# Patient Record
Sex: Male | Born: 1980 | Race: Black or African American | Hispanic: No | Marital: Married | Smoking: Never smoker
Health system: Southern US, Community
[De-identification: ages and names within clinical notes are randomized; demographics above are authoritative.]

## PROBLEM LIST (undated history)

## (undated) DIAGNOSIS — E669 Obesity, unspecified: Secondary | ICD-10-CM

## (undated) DIAGNOSIS — L02212 Cutaneous abscess of back [any part, except buttock]: Secondary | ICD-10-CM

## (undated) DIAGNOSIS — E119 Type 2 diabetes mellitus without complications: Secondary | ICD-10-CM

## (undated) DIAGNOSIS — I10 Essential (primary) hypertension: Secondary | ICD-10-CM

## (undated) DIAGNOSIS — K219 Gastro-esophageal reflux disease without esophagitis: Secondary | ICD-10-CM

## (undated) DIAGNOSIS — G4733 Obstructive sleep apnea (adult) (pediatric): Secondary | ICD-10-CM

## (undated) DIAGNOSIS — E662 Morbid (severe) obesity with alveolar hypoventilation: Secondary | ICD-10-CM

## (undated) DIAGNOSIS — I5020 Unspecified systolic (congestive) heart failure: Secondary | ICD-10-CM

## (undated) DIAGNOSIS — J9690 Respiratory failure, unspecified, unspecified whether with hypoxia or hypercapnia: Secondary | ICD-10-CM

## (undated) DIAGNOSIS — L723 Sebaceous cyst: Secondary | ICD-10-CM

## (undated) DIAGNOSIS — I429 Cardiomyopathy, unspecified: Secondary | ICD-10-CM

## (undated) DIAGNOSIS — J9611 Chronic respiratory failure with hypoxia: Secondary | ICD-10-CM

## (undated) HISTORY — DX: Sebaceous cyst: L72.3

## (undated) HISTORY — PX: NO PAST SURGERIES: SHX2092

## (undated) HISTORY — DX: Cutaneous abscess of back (any part, except buttock and flank): L02.212

---

## 2009-08-15 ENCOUNTER — Emergency Department (HOSPITAL_COMMUNITY): Admission: EM | Admit: 2009-08-15 | Discharge: 2009-08-15 | Payer: Self-pay | Admitting: Emergency Medicine

## 2010-06-05 ENCOUNTER — Emergency Department: Payer: Self-pay | Admitting: Emergency Medicine

## 2010-10-11 LAB — DIFFERENTIAL
Basophils Relative: 1 % (ref 0–1)
Monocytes Relative: 12 % (ref 3–12)
Neutro Abs: 5 10*3/uL (ref 1.7–7.7)
Neutrophils Relative %: 62 % (ref 43–77)

## 2010-10-11 LAB — COMPREHENSIVE METABOLIC PANEL
Albumin: 4 g/dL (ref 3.5–5.2)
Alkaline Phosphatase: 66 U/L (ref 39–117)
BUN: 5 mg/dL — ABNORMAL LOW (ref 6–23)
Calcium: 9.2 mg/dL (ref 8.4–10.5)
Creatinine, Ser: 0.89 mg/dL (ref 0.4–1.5)
Potassium: 5.7 mEq/L — ABNORMAL HIGH (ref 3.5–5.1)
Total Protein: 8.1 g/dL (ref 6.0–8.3)

## 2010-10-11 LAB — CBC
Hemoglobin: 14.4 g/dL (ref 13.0–17.0)
MCHC: 34 g/dL (ref 30.0–36.0)
RDW: 12.6 % (ref 11.5–15.5)
WBC: 8.1 10*3/uL (ref 4.0–10.5)

## 2010-10-11 LAB — URINE MICROSCOPIC-ADD ON

## 2010-10-11 LAB — URINALYSIS, ROUTINE W REFLEX MICROSCOPIC
Hgb urine dipstick: NEGATIVE
Specific Gravity, Urine: 1.026 (ref 1.005–1.030)
pH: 6 (ref 5.0–8.0)

## 2010-10-11 LAB — POCT I-STAT, CHEM 8
Calcium, Ion: 1.13 mmol/L (ref 1.12–1.32)
Hemoglobin: 15.6 g/dL (ref 13.0–17.0)
Sodium: 137 mEq/L (ref 135–145)
TCO2: 27 mmol/L (ref 0–100)

## 2013-08-24 ENCOUNTER — Emergency Department: Payer: Self-pay | Admitting: Emergency Medicine

## 2015-02-09 ENCOUNTER — Emergency Department
Admission: EM | Admit: 2015-02-09 | Discharge: 2015-02-09 | Disposition: A | Discharge status: Home / Self Care | Payer: No Typology Code available for payment source | Attending: Emergency Medicine | Admitting: Emergency Medicine

## 2015-02-09 DIAGNOSIS — R631 Polydipsia: Secondary | ICD-10-CM | POA: Diagnosis present

## 2015-02-09 DIAGNOSIS — R197 Diarrhea, unspecified: Secondary | ICD-10-CM | POA: Insufficient documentation

## 2015-02-09 DIAGNOSIS — I10 Essential (primary) hypertension: Secondary | ICD-10-CM | POA: Diagnosis not present

## 2015-02-09 DIAGNOSIS — Z720 Tobacco use: Secondary | ICD-10-CM | POA: Insufficient documentation

## 2015-02-09 HISTORY — DX: Essential (primary) hypertension: I10

## 2015-02-09 LAB — GLUCOSE, CAPILLARY: GLUCOSE-CAPILLARY: 145 mg/dL — AB (ref 65–99)

## 2015-02-09 NOTE — Discharge Instructions (Signed)

## 2015-02-09 NOTE — ED Notes (Signed)
Last 2 days has had disrrhea, a lot of thirst, feels some betterm no diarrhea today

## 2015-02-09 NOTE — ED Notes (Signed)
Pt c/o increased thirst and urination for the past 2 days, FS 145 in triage, denies hx of DM, denies any burning with urination or other sx.

## 2015-02-09 NOTE — ED Provider Notes (Signed)
[facility] Emergency Department Provider Note  ____________________________________________  Time seen: On arrival  I have reviewed the triage vital signs and the nursing notes.   HISTORY  Chief Complaint Polydipsia and Urinary Frequency    HPI Clifford Nichols is a 34 y.o. male who presents with complaints of diarrhea for the past 2 days. He notes his diarrhea is improved and his stools are solid today but he felt the need to come to the emergency department to be checked out to make sure he is not dehydrated. He denies dizziness. He denies near syncope. No abdominal pain. Her fevers no chills. No sick contacts.    Past Medical History  Diagnosis Date  . Hypertension     There are no active problems to display for this patient.   History reviewed. No pertinent past surgical history.  No current outpatient prescriptions on file.  Allergies Review of patient's allergies indicates no known allergies.  No family history on file.  Social History History  Substance Use Topics  . Smoking status: Current Every Day Smoker -- 0.50 packs/day    Types: Cigarettes  . Smokeless tobacco: Never Used  . Alcohol Use: Yes    Review of Systems  Constitutional: Negative for fever. Eyes: Negative for visual changes. ENT: Negative for sore throat Abdominal: No abdominal pain Genitourinary: Negative for dysuria. Musculoskeletal: Negative for back pain. Skin: Negative for rash. Neurological: Negative for headaches or dizziness   ____________________________________________   PHYSICAL EXAM:  VITAL SIGNS: ED Triage Vitals  Enc Vitals Group     BP 02/09/15 1132 157/99 mmHg     Pulse Rate 02/09/15 1132 100     Resp 02/09/15 1132 18     Temp 02/09/15 1132 98.2 F (36.8 C)     Temp Source 02/09/15 1132 Oral     SpO2 02/09/15 1132 95 %     Weight 02/09/15 1132 365 lb (165.563 kg)     Height 02/09/15 1132  (1.702 m)     Head Cir --      Peak  Flow --      Pain Score --      Pain Loc --      Pain Edu? --      Excl. in GC? --      Constitutional: Alert and oriented. Well appearing and in no distress. Eyes: Conjunctivae are normal.  ENT   Head: Normocephalic and atraumatic.   Mouth/Throat: Mucous membranes are moist. Cardiovascular: Normal rate, regular rhythm.  Respiratory: Normal respiratory effort without tachypnea nor retractions.  Gastrointestinal: Soft and non-tender in all quadrants. No distention. There is no CVA tenderness. Musculoskeletal: Nontender with normal range of motion in all extremities. Neurologic:  Normal speech and language. No gross focal neurologic deficits are appreciated. Skin:  Skin is warm, dry and intact. No rash noted. Psychiatric: Mood and affect are normal. Patient exhibits appropriate insight and judgment.  ____________________________________________    LABS (pertinent positives/negatives)  Labs Reviewed  GLUCOSE, CAPILLARY - Abnormal; Notable for the following:    Glucose-Capillary 145 (*)    All other components within normal limits    ____________________________________________     ____________________________________________    RADIOLOGY I have personally reviewed any xrays that were ordered on this patient: None  ____________________________________________   PROCEDURES  Procedure(s) performed: none   ____________________________________________   INITIAL IMPRESSION / ASSESSMENT AND PLAN / ED COURSE  Pertinent labs & imaging results that were available during my care of the patient were  reviewed by me and considered in my medical decision making (see chart for details).  Patient well-appearing on exam. No acute distress. Heart rate 88 on my exam. He reports he feels well. I think is okay for discharge with PCP follow up as needed  ____________________________________________   FINAL CLINICAL IMPRESSION(S) / ED DIAGNOSES  Final diagnoses:   Diarrhea     Jene Everyobert Marsa Matteo, MD 02/09/15 [phone]

## 2018-04-26 ENCOUNTER — Inpatient Hospital Stay
Admission: EM | Admit: 2018-04-26 | Discharge: 2018-04-29 | DRG: 872 | Disposition: A | Discharge status: Home / Self Care | Payer: BLUE CROSS/BLUE SHIELD | Attending: Internal Medicine | Admitting: Internal Medicine

## 2018-04-26 ENCOUNTER — Emergency Department: Payer: BLUE CROSS/BLUE SHIELD

## 2018-04-26 ENCOUNTER — Encounter: Payer: Self-pay | Admitting: Emergency Medicine

## 2018-04-26 ENCOUNTER — Other Ambulatory Visit: Payer: Self-pay

## 2018-04-26 DIAGNOSIS — L02419 Cutaneous abscess of limb, unspecified: Secondary | ICD-10-CM | POA: Diagnosis present

## 2018-04-26 DIAGNOSIS — L03119 Cellulitis of unspecified part of limb: Secondary | ICD-10-CM

## 2018-04-26 DIAGNOSIS — A419 Sepsis, unspecified organism: Secondary | ICD-10-CM | POA: Diagnosis present

## 2018-04-26 DIAGNOSIS — A4189 Other specified sepsis: Secondary | ICD-10-CM | POA: Diagnosis not present

## 2018-04-26 DIAGNOSIS — L0291 Cutaneous abscess, unspecified: Secondary | ICD-10-CM | POA: Diagnosis not present

## 2018-04-26 DIAGNOSIS — L02212 Cutaneous abscess of back [any part, except buttock]: Secondary | ICD-10-CM | POA: Diagnosis present

## 2018-04-26 DIAGNOSIS — L03312 Cellulitis of back [any part except buttock]: Secondary | ICD-10-CM | POA: Diagnosis present

## 2018-04-26 DIAGNOSIS — L723 Sebaceous cyst: Secondary | ICD-10-CM | POA: Diagnosis present

## 2018-04-26 DIAGNOSIS — Z87891 Personal history of nicotine dependence: Secondary | ICD-10-CM

## 2018-04-26 DIAGNOSIS — Z6841 Body Mass Index (BMI) 40.0 and over, adult: Secondary | ICD-10-CM

## 2018-04-26 DIAGNOSIS — I10 Essential (primary) hypertension: Secondary | ICD-10-CM | POA: Diagnosis present

## 2018-04-26 DIAGNOSIS — Z8249 Family history of ischemic heart disease and other diseases of the circulatory system: Secondary | ICD-10-CM

## 2018-04-26 DIAGNOSIS — R739 Hyperglycemia, unspecified: Secondary | ICD-10-CM | POA: Diagnosis present

## 2018-04-26 LAB — CBC
HEMATOCRIT: 40.1 % (ref 40.0–52.0)
HEMOGLOBIN: 13.1 g/dL (ref 13.0–18.0)
MCH: 31.2 pg (ref 26.0–34.0)
MCHC: 32.7 g/dL (ref 32.0–36.0)
MCV: 95.4 fL (ref 80.0–100.0)
Platelets: 182 10*3/uL (ref 150–440)
RBC: 4.2 MIL/uL — ABNORMAL LOW (ref 4.40–5.90)
RDW: 13.2 % (ref 11.5–14.5)
WBC: 13.5 10*3/uL — AB (ref 3.8–10.6)

## 2018-04-26 LAB — BASIC METABOLIC PANEL
ANION GAP: 9 (ref 5–15)
BUN: 7 mg/dL (ref 6–20)
CHLORIDE: 97 mmol/L — AB (ref 98–111)
CO2: 32 mmol/L (ref 22–32)
Calcium: 8.7 mg/dL — ABNORMAL LOW (ref 8.9–10.3)
Creatinine, Ser: 1.21 mg/dL (ref 0.61–1.24)
GFR calc non Af Amer: >60 mL/min (ref >60)
Glucose, Bld: 181 mg/dL — ABNORMAL HIGH (ref 70–99)
Potassium: 3.3 mmol/L — ABNORMAL LOW (ref 3.5–5.1)
Sodium: 138 mmol/L (ref 135–145)

## 2018-04-26 LAB — LACTIC ACID, PLASMA: Lactic Acid, Venous: 1.5 mmol/L (ref 0.5–1.9)

## 2018-04-26 MED ORDER — ACETAMINOPHEN 500 MG PO TABS
1000.0000 mg | ORAL_TABLET | Freq: Once | ORAL | Status: AC
  Administered 2018-04-26: 1000 mg via ORAL
  Filled 2018-04-26: qty 2

## 2018-04-26 MED ORDER — SODIUM CHLORIDE 0.9 % IV BOLUS
1000.0000 mL | Freq: Once | INTRAVENOUS | Status: AC
  Administered 2018-04-26: 1000 mL via INTRAVENOUS

## 2018-04-26 MED ORDER — IOHEXOL 350 MG/ML SOLN
75.0000 mL | Freq: Once | INTRAVENOUS | Status: AC | PRN
  Administered 2018-04-26: 75 mL via INTRAVENOUS

## 2018-04-26 MED ORDER — HYDROMORPHONE HCL 1 MG/ML IJ SOLN
0.5000 mg | Freq: Once | INTRAMUSCULAR | Status: AC
  Administered 2018-04-26: 0.5 mg via INTRAVENOUS
  Filled 2018-04-26: qty 1

## 2018-04-26 MED ORDER — VANCOMYCIN HCL 10 G IV SOLR: 1.0000 g | Freq: Once | INTRAVENOUS | Status: DC

## 2018-04-26 MED ORDER — MORPHINE SULFATE (PF) 4 MG/ML IV SOLN
4.0000 mg | Freq: Once | INTRAVENOUS | Status: AC
  Administered 2018-04-26: 4 mg via INTRAVENOUS
  Filled 2018-04-26: qty 1

## 2018-04-26 MED ORDER — ACETAMINOPHEN 325 MG PO TABS: 650.0000 mg | ORAL_TABLET | Freq: Once | ORAL | Status: DC

## 2018-04-26 MED ORDER — VANCOMYCIN HCL IN DEXTROSE 1-5 GM/200ML-% IV SOLN
1000.0000 mg | Freq: Once | INTRAVENOUS | Status: AC
  Administered 2018-04-27: 1000 mg via INTRAVENOUS
  Filled 2018-04-26: qty 200

## 2018-04-26 MED ORDER — SODIUM CHLORIDE 0.9 % IV SOLN
2.0000 g | INTRAVENOUS | Status: DC
  Administered 2018-04-26: 2 g via INTRAVENOUS
  Filled 2018-04-26 (×2): qty 20

## 2018-04-26 NOTE — ED Triage Notes (Signed)
Patient to ER for abscess to upper back. Patient has large abscess area, approx size of baseball in diameter, with purulent drainage from area. Patient has surgery scheduled to remove, but then developed fever and increased pain.

## 2018-04-26 NOTE — ED Provider Notes (Signed)
[facility] Emergency Department Provider Note   First MD Initiated Contact with Patient 04/26/18 2318     (approximate)  I have reviewed the triage vital signs and the nursing notes.   HISTORY  Chief Complaint Abscess  HPI Clifford ROSIAK is a 37 y.o. male with history of midline back sebaceous cyst with plan for removal on 04/30/2018 presents to the emergency department with increasing pain, purulent drainage from that site. Patient also admits to fever. Patient states current pain score is 9 out of 10. Patient denies any difficulty breathing. Patient denies any weakness or numbness.   Past Medical History:  Diagnosis Date  . Hypertension     There are no active problems to display for this patient.   History reviewed. No pertinent surgical history.  Prior to Admission medications   Not on File    Allergies no known drug allergies History reviewed. No pertinent family history.  Social History Social History   Tobacco Use  . Smoking status: Former Smoker    Packs/day: 0.50    Types: Cigarettes  . Smokeless tobacco: Never Used  Substance Use Topics  . Alcohol use: Yes  . Drug use: No    Review of Systems Constitutional: positive for fever/chills Eyes: No visual changes. ENT: No sore throat. Cardiovascular: Denies chest pain. Respiratory: Denies shortness of breath. Gastrointestinal: No abdominal pain.  No nausea, no vomiting.  No diarrhea.  No constipation. Genitourinary: Negative for dysuria. Musculoskeletal: Negative for neck pain.  Negative for back pain. Integumentary: positive for back abscess. Neurological: Negative for headaches, focal weakness or numbness.   ____________________________________________   PHYSICAL EXAM:  VITAL SIGNS: ED Triage Vitals [04/26/18 2144]  Enc Vitals Group     BP (!) 143/85     Pulse Rate (!) 128     Resp 20     Temp 100.2 F (37.9 C)     Temp Source Oral     SpO2 95 %     Weight  (!) 181.4 kg (400 lb)     Height 1.702 m (5\' 7" )     Head Circumference      Peak Flow      Pain Score 9     Pain Loc      Pain Edu?      Excl. in GC?     Constitutional: Alert and oriented. Well appearing and in no acute distress. Eyes: Conjunctivae are normal.  Mouth/Throat: Mucous membranes are moist. Oropharynx non-erythematous. Neck: No stridor.  Cardiovascular: Normal rate, regular rhythm. Good peripheral circulation. Grossly normal heart sounds. Respiratory: Normal respiratory effort.  No retractions. Lungs CTAB. Gastrointestinal: Soft and nontender. No distention.  Musculoskeletal: No lower extremity tenderness nor edema. No gross deformities of extremities. Neurologic:  Normal speech and language. No gross focal neurologic deficits are appreciated.  Skin:  18 x 20 cm area of erythema with central flocculence with purulent drainage mid upper back Psychiatric: Mood and affect are normal. Speech and behavior are normal.  ____________________________________________   LABS (all labs ordered are listed, but only abnormal results are displayed)  Labs Reviewed  CBC - Abnormal; Notable for the following components:      Result Value   WBC 13.5 (*)    RBC 4.20 (*)    All other components within normal limits  BASIC METABOLIC PANEL - Abnormal; Notable for the following components:   Potassium 3.3 (*)    Chloride 97 (*)    Glucose, Bld 181 (*)  Calcium 8.7 (*)    All other components within normal limits  CULTURE, BLOOD (ROUTINE X 2)  CULTURE, BLOOD (ROUTINE X 2)  LACTIC ACID, PLASMA  LACTIC ACID, PLASMA   ____________________________________________  EKG  ED ECG REPORT I, Hilliard N Alleyah Twombly, the attending physician, personally viewed and interpreted this ECG.   Date: 04/27/2018  EKG Time: 12:06 AM  Rate: 131  Rhythm: Sinus tachycardia  Axis: Normal  Intervals: Normal  ST&T Change: None  ____________________________________________  RADIOLOGY I, [place] N  Tecora Eustache, personally viewed and evaluated these images (plain radiographs) as part of my medical decision making, as well as reviewing the written report by the radiologist.  ED MD interpretation: 3.8 cm right midline upper back abscess on CT per radiologist  Official radiology report(s): Ct Chest W Contrast  Result Date: 04/27/2018 CLINICAL DATA:  Upper back abscess EXAM: CT CHEST WITH CONTRAST TECHNIQUE: Multidetector CT imaging of the chest was performed during intravenous contrast administration. CONTRAST:  75mL OMNIPAQUE IOHEXOL 350 MG/ML SOLN COMPARISON:  None. FINDINGS: Cardiovascular: Heart is normal in size.  No pericardial effusion. No evidence of thoracic aortic aneurysm. Mediastinum/Nodes: No suspicious mediastinal lymphadenopathy. Small right axillary nodes measuring up to 11 mm short axis. Visualized thyroid is unremarkable. Lungs/Pleura: 12 mm platelike subpleural nodular opacity in the medial right lower lobe (series 3/image 123), with a triangular configuration on coronal imaging (coronal image 134), at the site of prior infection/pneumonia on remote CT abdomen/pelvis in 2011. This likely reflects post infectious/inflammatory scarring. No suspicious pulmonary nodules. Lungs are otherwise clear. No focal consolidation. No pleural effusion or pneumothorax. Upper Abdomen: Visualized upper abdomen is notable for hepatic steatosis. Musculoskeletal: 2.5 x 3.8 x 3.5 cm cystic lesion in the subcutaneous tissues of the right paramidline back (series 2/image 36), at the level of the upper thoracic spine, corresponding to the patient's known back abscess. Underlying subcutaneous stranding with overlying soft tissue thickening, reflecting associated cellulitis. Visualized osseous structures are within normal limits. IMPRESSION: 3.8 cm abscess in the right paramidline back. Associated cellulitis. 12 mm subpleural nodular opacity in the medial right lower lobe, at the site of prior infection/pneumonia on  remote CT, favoring post infectious/inflammatory scarring. No suspicious pulmonary nodules. Electronically Signed   By: Charline Bills M.D.   On: 04/27/2018 00:09      Critical Care performed:  .Critical Care Performed by: Darci Current, MD Authorized by: Darci Current, MD   Critical care provider statement:    Critical care time (minutes):  30   Critical care time was exclusive of:  Separately billable procedures and treating other patients   Critical care was time spent personally by me on the following activities:  Development of treatment plan with patient or surrogate, discussions with consultants, evaluation of patient's response to treatment, examination of patient, obtaining history from patient or surrogate, ordering and performing treatments and interventions, ordering and review of laboratory studies, ordering and review of radiographic studies, pulse oximetry, re-evaluation of patient's condition and review of old charts  .Marland KitchenIncision and Drainage Date/Time: 04/27/2018 2:02 AM Performed by: Darci Current, MD Authorized by: Darci Current, MD   Consent:    Consent obtained:  Verbal   Consent given by:  Patient   Risks discussed:  Bleeding, infection, incomplete drainage and pain   Alternatives discussed:  Alternative treatment, delayed treatment and observation Location:    Type:  Abscess   Location:  Trunk   Trunk location:  Back Anesthesia (see MAR for exact dosages):  Anesthesia method:  Local infiltration   Local anesthetic:  Lidocaine 1% w/o epi Procedure type:    Complexity:  Complex Procedure details:    Incision depth:  Submucosal   Scalpel blade:  11   Drainage:  Purulent and bloody   Packing materials:  1/2 in iodoform gauze   Amount 1/2" iodoform:  3 Post-procedure details:    Patient tolerance of procedure:  Tolerated well, no immediate complications     ____________________________________________   INITIAL IMPRESSION /  ASSESSMENT AND PLAN / ED COURSE  As part of my medical decision making, I reviewed the following data within the electronic MEDICAL RECORD NUMBER   37 year old male presented with above-stated history and physical exam consistent with upper back abscess with associated cellulitis.  Concern for possible sepsis given tachycardia fever leukocytosis.  Patient given IV vancomycin and cefepime.  Patient discussed with Dr. Anne Hahn for hospital admission for further evaluation and management. ____________________________________________  FINAL CLINICAL IMPRESSION(S) / ED DIAGNOSES  Final diagnoses:  Abscess  Cellulitis of back     MEDICATIONS GIVEN DURING THIS VISIT:  Medications  vancomycin (VANCOCIN) IVPB 1000 mg/200 mL premix (1,000 mg Intravenous New Bag/Given 04/27/18 0036)  cefTRIAXone (ROCEPHIN) 2 g in sodium chloride 0.9 % 100 mL IVPB (0 g Intravenous Stopped 04/27/18 0029)  sodium chloride 0.9 % bolus 1,000 mL (1,000 mLs Intravenous New Bag/Given 04/27/18 0035)  sodium chloride 0.9 % bolus 1,000 mL (0 mLs Intravenous Stopped 04/27/18 0029)  acetaminophen (TYLENOL) tablet 1,000 mg (1,000 mg Oral Given 04/26/18 2252)  HYDROmorphone (DILAUDID) injection 0.5 mg (0.5 mg Intravenous Given 04/26/18 2251)  morphine 4 MG/ML injection 4 mg (4 mg Intravenous Given 04/26/18 2350)  iohexol (OMNIPAQUE) 350 MG/ML injection 75 mL (75 mLs Intravenous Contrast Given 04/26/18 2336)     ED Discharge Orders    None       Note:  This document was prepared using Dragon voice recognition software and may include unintentional dictation errors.    Darci Current, MD 04/27/18 [phone]

## 2018-04-27 ENCOUNTER — Other Ambulatory Visit: Payer: Self-pay

## 2018-04-27 ENCOUNTER — Encounter: Payer: Self-pay | Admitting: Internal Medicine

## 2018-04-27 DIAGNOSIS — L02212 Cutaneous abscess of back [any part, except buttock]: Secondary | ICD-10-CM

## 2018-04-27 DIAGNOSIS — A4189 Other specified sepsis: Secondary | ICD-10-CM | POA: Diagnosis present

## 2018-04-27 DIAGNOSIS — R739 Hyperglycemia, unspecified: Secondary | ICD-10-CM | POA: Diagnosis present

## 2018-04-27 DIAGNOSIS — L03119 Cellulitis of unspecified part of limb: Secondary | ICD-10-CM

## 2018-04-27 DIAGNOSIS — L03312 Cellulitis of back [any part except buttock]: Secondary | ICD-10-CM | POA: Diagnosis present

## 2018-04-27 DIAGNOSIS — Z8249 Family history of ischemic heart disease and other diseases of the circulatory system: Secondary | ICD-10-CM | POA: Diagnosis not present

## 2018-04-27 DIAGNOSIS — A419 Sepsis, unspecified organism: Secondary | ICD-10-CM | POA: Diagnosis present

## 2018-04-27 DIAGNOSIS — L723 Sebaceous cyst: Secondary | ICD-10-CM

## 2018-04-27 DIAGNOSIS — I10 Essential (primary) hypertension: Secondary | ICD-10-CM | POA: Diagnosis present

## 2018-04-27 DIAGNOSIS — Z87891 Personal history of nicotine dependence: Secondary | ICD-10-CM | POA: Diagnosis not present

## 2018-04-27 DIAGNOSIS — Z6841 Body Mass Index (BMI) 40.0 and over, adult: Secondary | ICD-10-CM | POA: Diagnosis not present

## 2018-04-27 DIAGNOSIS — L0291 Cutaneous abscess, unspecified: Secondary | ICD-10-CM | POA: Diagnosis present

## 2018-04-27 DIAGNOSIS — L02419 Cutaneous abscess of limb, unspecified: Secondary | ICD-10-CM | POA: Diagnosis present

## 2018-04-27 LAB — CBC
HCT: 37.8 % — ABNORMAL LOW (ref 40.0–52.0)
Hemoglobin: 12.5 g/dL — ABNORMAL LOW (ref 13.0–18.0)
MCH: 32.1 pg (ref 26.0–34.0)
MCHC: 33 g/dL (ref 32.0–36.0)
MCV: 97.1 fL (ref 80.0–100.0)
PLATELETS: 169 10*3/uL (ref 150–440)
RBC: 3.89 MIL/uL — ABNORMAL LOW (ref 4.40–5.90)
RDW: 13.4 % (ref 11.5–14.5)
WBC: 13.2 10*3/uL — AB (ref 3.8–10.6)

## 2018-04-27 LAB — BASIC METABOLIC PANEL
ANION GAP: 10 (ref 5–15)
BUN: 6 mg/dL (ref 6–20)
CO2: 31 mmol/L (ref 22–32)
Calcium: 8.2 mg/dL — ABNORMAL LOW (ref 8.9–10.3)
Chloride: 99 mmol/L (ref 98–111)
Creatinine, Ser: 1.09 mg/dL (ref 0.61–1.24)
Glucose, Bld: 164 mg/dL — ABNORMAL HIGH (ref 70–99)
Potassium: 3.7 mmol/L (ref 3.5–5.1)
SODIUM: 140 mmol/L (ref 135–145)

## 2018-04-27 LAB — GLUCOSE, CAPILLARY
Glucose-Capillary: 134 mg/dL — ABNORMAL HIGH (ref 70–99)
Glucose-Capillary: 131 mg/dL — ABNORMAL HIGH (ref 70–99)

## 2018-04-27 MED ORDER — INSULIN ASPART 100 UNIT/ML ~~LOC~~ SOLN
0.0000 [IU] | Freq: Three times a day (TID) | SUBCUTANEOUS | Status: DC
  Administered 2018-04-27 – 2018-04-28 (×3): 1 [IU] via SUBCUTANEOUS
  Filled 2018-04-27 (×2): qty 1

## 2018-04-27 MED ORDER — SODIUM CHLORIDE 0.9 % IV BOLUS
1000.0000 mL | Freq: Once | INTRAVENOUS | Status: AC
  Administered 2018-04-27: 1000 mL via INTRAVENOUS

## 2018-04-27 MED ORDER — ENOXAPARIN SODIUM 40 MG/0.4ML ~~LOC~~ SOLN
40.0000 mg | Freq: Two times a day (BID) | SUBCUTANEOUS | Status: DC
  Administered 2018-04-27 – 2018-04-29 (×5): 40 mg via SUBCUTANEOUS
  Filled 2018-04-27 (×5): qty 0.4

## 2018-04-27 MED ORDER — METOPROLOL TARTRATE 25 MG PO TABS
25.0000 mg | ORAL_TABLET | Freq: Two times a day (BID) | ORAL | Status: DC
  Administered 2018-04-27 – 2018-04-29 (×5): 25 mg via ORAL
  Filled 2018-04-27 (×5): qty 1

## 2018-04-27 MED ORDER — ONDANSETRON HCL 4 MG/2ML IJ SOLN: 4.0000 mg | Freq: Four times a day (QID) | INTRAMUSCULAR | Status: DC | PRN

## 2018-04-27 MED ORDER — VANCOMYCIN HCL 10 G IV SOLR
1250.0000 mg | Freq: Three times a day (TID) | INTRAVENOUS | Status: DC
  Administered 2018-04-27 – 2018-04-28 (×3): 1250 mg via INTRAVENOUS
  Filled 2018-04-27 (×4): qty 1250

## 2018-04-27 MED ORDER — ACETAMINOPHEN 650 MG RE SUPP: 650.0000 mg | Freq: Four times a day (QID) | RECTAL | Status: DC | PRN

## 2018-04-27 MED ORDER — MORPHINE SULFATE (PF) 2 MG/ML IV SOLN
2.0000 mg | Freq: Once | INTRAVENOUS | Status: AC
  Administered 2018-04-27: 2 mg via INTRAVENOUS
  Filled 2018-04-27: qty 1

## 2018-04-27 MED ORDER — LIDOCAINE HCL 1 % IJ SOLN
20.0000 mL | Freq: Once | INTRAMUSCULAR | Status: DC
  Filled 2018-04-27: qty 20

## 2018-04-27 MED ORDER — ACETAMINOPHEN 325 MG PO TABS
650.0000 mg | ORAL_TABLET | Freq: Four times a day (QID) | ORAL | Status: DC | PRN
  Administered 2018-04-27 – 2018-04-28 (×2): 650 mg via ORAL
  Filled 2018-04-27 (×3): qty 2

## 2018-04-27 MED ORDER — OXYCODONE HCL 5 MG PO TABS
5.0000 mg | ORAL_TABLET | ORAL | Status: AC | PRN
  Administered 2018-04-27: 5 mg via ORAL
  Filled 2018-04-27: qty 1

## 2018-04-27 MED ORDER — ONDANSETRON HCL 4 MG PO TABS: 4.0000 mg | ORAL_TABLET | Freq: Four times a day (QID) | ORAL | Status: DC | PRN

## 2018-04-27 MED ORDER — MORPHINE SULFATE (PF) 4 MG/ML IV SOLN
4.0000 mg | Freq: Once | INTRAVENOUS | Status: AC
  Administered 2018-04-27: 4 mg via INTRAVENOUS
  Filled 2018-04-27: qty 1

## 2018-04-27 MED ORDER — LIDOCAINE HCL (PF) 1 % IJ SOLN
INTRAMUSCULAR | Status: AC
  Administered 2018-04-27: 02:00:00
  Filled 2018-04-27: qty 10

## 2018-04-27 MED ORDER — HYDROCHLOROTHIAZIDE 25 MG PO TABS
25.0000 mg | ORAL_TABLET | Freq: Every day | ORAL | Status: DC
  Administered 2018-04-27 – 2018-04-28 (×2): 25 mg via ORAL
  Filled 2018-04-27 (×2): qty 1

## 2018-04-27 NOTE — Care Management Note (Signed)
Case Management Note  Patient Details  Name: Clifford Nichols MRN: [id number] Date of Birth: 06/12/1981  Subjective/Objective:     Patient independent from home with abscess on back and fever.  Receiving IV antibiotic.  He does not have a PCP.  RNCM asked if an appointment could be made while here in the hospital. He said that would be fine to make him an appointment.  Offered patient choice for MD office.  Decided on [facility].  Called office and gave patient information and insurance information.  They will call his room with an appointment.  HE does have BCBS and prescription coverage.  No issues with transportation or housing.               Action/Plan:No further needs identified at this time.   Expected Discharge Date:                  Expected Discharge Plan:  Home/Self Care  In-House Referral:     Discharge planning Services  CM Consult  Post Acute Care Choice:    Choice offered to:     DME Arranged:    DME Agency:     HH Arranged:    HH Agency:     Status of Service:  Completed, signed off  If discussed at [employer] of Stay Meetings, dates discussed:    Additional Comments:  Sherren Kerns, RN 04/27/2018, 10:19 AM

## 2018-04-27 NOTE — Progress Notes (Signed)
Pharmacy Antibiotic Note  Clifford Nichols is a 37 y.o. male admitted on 04/26/2018 with cellulitis.  Pharmacy has been consulted for vancomycin dosing.  Plan: DW 112kg  Vd 78L kei 9.1 hr-1  T1/2 7 hours Vancomycin 1250 mg q 8 hours ordered. Not candidate for stacked dosing. Level before 5th dose. Goal trough 15-20  Height: 5\' 7"  (170.2 cm) Weight: (!) 400 lb (181.4 kg) IBW/kg (Calculated) : 66.1  Temp (24hrs), Avg:100.2 F (37.9 C), Min:100.2 F (37.9 C), Max:100.2 F (37.9 C)  Recent Labs  Lab 04/26/18 2228  WBC 13.5*  CREATININE 1.21  LATICACIDVEN 1.5    Estimated Creatinine Clearance: 135.2 mL/min (by C-G formula based on SCr of 1.21 mg/dL).    No Known Allergies  Antimicrobials this admission: Ceftriaxone, vancomycin 10/3  >>    >>   Dose adjustments this admission:   Microbiology results: 10/3  BCx: pending    Thank you for allowing pharmacy to be a part of this patient's care.  Doloros Kwolek S 04/27/2018 1:12 AM

## 2018-04-27 NOTE — Progress Notes (Signed)
Lovenox changed to 40 mg BID for BMI >40 and CrCl >30. 

## 2018-04-27 NOTE — Progress Notes (Signed)
CODE SEPSIS - PHARMACY COMMUNICATION  **Broad Spectrum Antibiotics should be administered within 1 hour of Sepsis diagnosis**  Time Code Sepsis Called/Page Received: 1003 2328  Antibiotics Ordered: 1003 2329  Time of 1st antibiotic administration: 1003 2355  Additional action taken by pharmacy:   If necessary, Name of Provider/Nurse Contacted:     Erich Montane ,PharmD Clinical Pharmacist  04/27/2018  1:03 AM

## 2018-04-27 NOTE — ED Notes (Signed)
Pt being transported to rm 232 at this time by this tech.

## 2018-04-27 NOTE — Progress Notes (Signed)
[facility] -  at [facility]   PATIENT NAME: Clifford Nichols    MR#:  [id number]  DATE OF BIRTH:  08/10/1980  SUBJECTIVE:  came in with increasing pain in the back along with drainage. Patient had sebaceous cyst for last 10 years  REVIEW OF SYSTEMS:   Review of Systems  Constitutional: Negative for chills, fever and weight loss.  HENT: Negative for ear discharge, ear pain and nosebleeds.   Eyes: Negative for blurred vision, pain and discharge.  Respiratory: Negative for sputum production, shortness of breath, wheezing and stridor.   Cardiovascular: Negative for chest pain, palpitations, orthopnea and PND.  Gastrointestinal: Negative for abdominal pain, diarrhea, nausea and vomiting.  Genitourinary: Negative for frequency and urgency.  Musculoskeletal: Positive for back pain. Negative for joint pain.  Neurological: Negative for sensory change, speech change, focal weakness and weakness.  Psychiatric/Behavioral: Negative for depression and hallucinations. The patient is not nervous/anxious.    Tolerating Diet:yes Tolerating PT:   DRUG ALLERGIES:  No Known Allergies  VITALS:  Blood pressure 130/74, pulse (!) 107, temperature (!) 100.5 F (38.1 C), temperature source Oral, resp. rate 16, height 5\' 7"  (1.702 m), weight (!) 173.4 kg, SpO2 94 %.  PHYSICAL EXAMINATION:   Physical Exam  GENERAL:  37 y.o.-year-old patient lying in the bed with no acute distress.  EYES: Pupils equal, round, reactive to light and accommodation. No scleral icterus. Extraocular muscles intact.  HEENT: Head atraumatic, normocephalic. Oropharynx and nasopharynx clear.  NECK:  Supple, no jugular venous distention. No thyroid enlargement, no tenderness.  LUNGS: Normal breath sounds bilaterally, no wheezing, rales, rhonchi. No use of accessory muscles of respiration.  CARDIOVASCULAR: S1, S2 normal. No murmurs, rubs, or gallops.  ABDOMEN: Soft, nontender, nondistended. Bowel  sounds present. No organomegaly or mass.  EXTREMITIES: No cyanosis, clubbing or edema b/l.    NEUROLOGIC: Cranial nerves II through XII are intact. No focal Motor or sensory deficits b/l.   PSYCHIATRIC:  patient is alert and oriented x 3.  SKIN: No obvious rash, lesion, or ulcer. Sebaceous cyst right upper back with drainage along with surrounding cellulitis  LABORATORY PANEL:  CBC Recent Labs  Lab 04/27/18 0644  WBC 13.2*  HGB 12.5*  HCT 37.8*  PLT 169    Chemistries  Recent Labs  Lab 04/27/18 0644  NA 140  K 3.7  CL 99  CO2 31  GLUCOSE 164*  BUN 6  CREATININE 1.09  CALCIUM 8.2*   Cardiac Enzymes No results for input(s): TROPONINI in the last 168 hours. RADIOLOGY:  Ct Chest W Contrast  Result Date: 04/27/2018 CLINICAL DATA:  Upper back abscess EXAM: CT CHEST WITH CONTRAST TECHNIQUE: Multidetector CT imaging of the chest was performed during intravenous contrast administration. CONTRAST:  75mL OMNIPAQUE IOHEXOL 350 MG/ML SOLN COMPARISON:  None. FINDINGS: Cardiovascular: Heart is normal in size.  No pericardial effusion. No evidence of thoracic aortic aneurysm. Mediastinum/Nodes: No suspicious mediastinal lymphadenopathy. Small right axillary nodes measuring up to 11 mm short axis. Visualized thyroid is unremarkable. Lungs/Pleura: 12 mm platelike subpleural nodular opacity in the medial right lower lobe (series 3/image 123), with a triangular configuration on coronal imaging (coronal image 134), at the site of prior infection/pneumonia on remote CT abdomen/pelvis in 2011. This likely reflects post infectious/inflammatory scarring. No suspicious pulmonary nodules. Lungs are otherwise clear. No focal consolidation. No pleural effusion or pneumothorax. Upper Abdomen: Visualized upper abdomen is notable for hepatic steatosis. Musculoskeletal: 2.5 x 3.8 x 3.5 cm cystic lesion in the subcutaneous  tissues of the right paramidline back (series 2/image 36), at the level of the upper thoracic  spine, corresponding to the patient's known back abscess. Underlying subcutaneous stranding with overlying soft tissue thickening, reflecting associated cellulitis. Visualized osseous structures are within normal limits. IMPRESSION: 3.8 cm abscess in the right paramidline back. Associated cellulitis. 12 mm subpleural nodular opacity in the medial right lower lobe, at the site of prior infection/pneumonia on remote CT, favoring post infectious/inflammatory scarring. No suspicious pulmonary nodules. Electronically Signed   By: Charline Bills M.D.   On: 04/27/2018 00:09   ASSESSMENT AND PLAN:   Clifford Nichols  is a 37 y.o. male who presents with chief complaint as above.  Patient presents with cellulitis around a superinfected sebaceous cyst on his back.  He presented tachycardic with leukocytosis, meeting sepsis criteria.  He has surrounding cellulitis.    *Sepsis (HCC) -IV antibiotics initiated, lactic acid was within normal limits, blood pressure stable, cause of his sepsis is cellulitis with abscess of infected sebaceous cyst - cultures sent -IV vancomycin for now--change to oral bactrim at discharge -surgical consultation with Dr. Earlene Plater.--- Likely will need I N D   *Cellulitis and abscess of upper extremity -IV antibiotics as above -await surgical consultation  *  HTN (hypertension) -stable, continue home meds -pt is on beta-blockers and hydrochlorothiazide  *Hyperglycemia -check A1c -sliding scale insulin -start metformin if sugars remain elevated including A1c.  DVT prophylaxis subcu Lovenox.  Above was discussed with patient and family  Case discussed with Care Management/Social Worker. Management plans discussed with the patient, family and they are in agreement.  CODE STATUS: full  DVT Prophylaxis: lovenox  TOTAL TIME TAKING CARE OF THIS PATIENT: *30* minutes.  >50% time spent on counselling and coordination of care  POSSIBLE D/C IN *1-2* DAYS, DEPENDING ON CLINICAL  CONDITION.  Note: This dictation was prepared with Dragon dictation along with smaller phrase technology. Any transcriptional errors that result from this process are unintentional.  Enedina Finner M.D on 04/27/2018 at 2:08 PM  Between 7am to 6pm - Pager - [phone]  After 6pm go to [email or web address] - password [email or web address]  [facility]  Office  [phone]  CC: Primary care physician; Patient, No Pcp PerPatient ID: Clifford Nichols, male   DOB: 10-Jul-1981, 37 y.o.   MRN: [id number]

## 2018-04-27 NOTE — Consult Note (Addendum)
SURGICAL CONSULTATION NOTE (initial) - cpt: 40102  Patient seen and examined as described below with surgical PA-C, Gillermina Phy.  Assessment/Plan: (ICD-10's: L02.212, L72.3) In summary, patient is a 37 y.o. male with a large Right of mid-upper back cutaneous abscess attributed to infection of chronic sebaceous cyst, complicated by pertinent comorbidities including morbid obesity (BMI 60), HTN, and former tobacco abuse (smoking).   - pain control as needed (minimize narcotics)   - continue antibiotics as per primary medical team  - all risks, benefits, and alternatives to bedside incision and drainage of large Left of mid-upper back abscess were discussed with the patient and his friends/family, all of their questions were answered to their expressed satisfaction, patient expresses he wishes to proceed, and informed consent was obtained.  - will proceed with bedside incision and drainage of large Left mid-upper back abscess  - applauded for smoking cessation, weight loss encouraged  - consider bariatric program referral at discharge   - DVT prophylaxis, ambulation encouraged  I have personally reviewed the patient's chart, evaluated/examined the patient, proposed the recommended management, and discussed these recommendations with the patient and his family/friends to their expressed satisfaction as well as with patient's RN and medical physician.  Thank you for the opportunity to participate in this patient's care.  -- Scherrie Gerlach Earlene Plater, MD, RPVI [place]:  [facility] - Partnering for exceptional care. Office: [phone]    SURGICAL CONSULTATION NOTE (initial) - cpt: [id number]  HISTORY OF PRESENT ILLNESS (HPI):  37 y.o. male presented to [facility] ED on 10/03 for evaluation of infected sebaceous cyst. Patient has a known history of sebaceous cyst on his back which was scheduled for removal on 10/07. However, in the last few days he has noticed increasing  pain, drainage, and fever at home. He was found to have a WBC of 13.5. An I&D was preformed in the ED and he was admitted to medicine for IV Abx  Surgery is consulted by hospitalist  physician Dr. Anne Hahn, MD in this context for evaluation and management of infected sebecous cyst.  PAST MEDICAL HISTORY (PMH):  Past Medical History:  Diagnosis Date  . Hypertension      PAST SURGICAL HISTORY (PSH):  Past Surgical History:  Procedure Laterality Date  . NO PAST SURGERIES       MEDICATIONS:  Prior to Admission medications   Not on File     ALLERGIES:  No Known Allergies   SOCIAL HISTORY:  Social History   Socioeconomic History  . Marital status: Married    Spouse name: Not on file  . Number of children: Not on file  . Years of education: Not on file  . Highest education level: Not on file  Occupational History  . Not on file  Social Needs  . Financial resource strain: Not on file  . Food insecurity:    Worry: Not on file    Inability: Not on file  . Transportation needs:    Medical: Not on file    Non-medical: Not on file  Tobacco Use  . Smoking status: Former Smoker    Packs/day: 0.50    Types: Cigarettes  . Smokeless tobacco: Never Used  Substance and Sexual Activity  . Alcohol use: Yes  . Drug use: No  . Sexual activity: Yes  Lifestyle  . Physical activity:    Days per week: Not on file    Minutes per session: Not on file  . Stress: Not on file  Relationships  .  Social connections:    Talks on phone: Not on file    Gets together: Not on file    Attends religious service: Not on file    Active member of club or organization: Not on file    Attends meetings of clubs or organizations: Not on file    Relationship status: Not on file  . Intimate partner violence:    Fear of current or ex partner: Not on file    Emotionally abused: Not on file    Physically abused: Not on file    Forced sexual activity: Not on file  Other Topics Concern  . Not on file   Social History Narrative  . Not on file    The patient currently resides (home / rehab facility / nursing home): Home The patient normally is (ambulatory / bedbound): Ambulatory   FAMILY HISTORY:  Family History  Problem Relation Age of Onset  . Hypertension Father      REVIEW OF SYSTEMS:  Constitutional: denies weight loss, + fever, no chills, or sweats  Eyes: denies any other vision changes, history of eye injury  ENT: denies sore throat, hearing problems  Respiratory: denies shortness of breath, wheezing  Cardiovascular: denies chest pain, palpitations  Gastrointestinal: denies abdominal pain, N/V, or diarrhea/and bowel function as per HPI Genitourinary: denies burning with urination or urinary frequency Musculoskeletal: denies any other joint pains or cramps  Skin: + sebaceous cyst Neurological: denies any other headache, dizziness, weakness  Psychiatric: denies any other depression, anxiety   All other review of systems were negative   VITAL SIGNS:  Temp:  [99.8 F (37.7 C)-100.5 F (38.1 C)] 100.5 F (38.1 C) (10/04 0757) Pulse Rate:  [98-128] 107 (10/04 0757) Resp:  [16-20] 16 (10/04 0757) BP: (130-179)/(71-100) 130/74 (10/04 0757) SpO2:  [90 %-97 %] 94 % (10/04 0757) Weight:  [173.4 kg-181.4 kg] 173.4 kg (10/04 0546)     Height: 5\' 7"  (170.2 cm) Weight: (!) 173.4 kg BMI (Calculated): 59.86   INTAKE/OUTPUT:  This shift: No intake/output data recorded.  Last 2 shifts: @IOLAST2SHIFTS @   PHYSICAL EXAM:  Constitutional:  -- Obese body habitus  -- Awake, alert, and oriented x3, no apparent distress Eyes:  -- Pupils equally round and reactive to light  -- No scleral icterus, B/L no occular discharge Ear, nose, throat: -- Neck is FROM WNL Pulmonary:  -- No wheezes or rhales -- Equal breath sounds bilaterally -- Breathing non-labored at rest Cardiovascular:  -- S1, S2 present  -- No pericardial rubs  Gastrointestinal:  -- Abdomen soft, nontender,  non-distended, no guarding or rebound tenderness -- Wounds or skin discoloration: Large sebaceous cyst to the midline upper back, previous incision, still area or palpable fluctuance -- Extremities: B/L UE and LE FROM, hands and feet warm, no edema  Neurologic:  -- Motor function: Intact and symmetric -- Sensation: Intact and symmetric Psychiatric:  -- Mood and affect WNL    Labs:  CBC Latest Ref Rng & Units 04/27/2018 04/26/2018 08/15/2009  WBC 3.8 - 10.6 K/uL 13.2(H) 13.5(H) -  Hemoglobin 13.0 - 18.0 g/dL 12.5(L) 13.1 15.6  Hematocrit 40.0 - 52.0 % 37.8(L) 40.1 46.0  Platelets 150 - 440 K/uL 169 182 -   CMP Latest Ref Rng & Units 04/27/2018 04/26/2018 08/15/2009  Glucose 70 - 99 mg/dL 962(X) 528(U) 132(G)  BUN 6 - 20 mg/dL 6 7 5(L)  Creatinine 4.01 - 1.24 mg/dL 0.27 2.53 1.1  Sodium 664 - 145 mmol/L 140 138 137  Potassium 3.5 -  5.1 mmol/L 3.7 3.3(L) 3.7  Chloride 98 - 111 mmol/L 99 97(L) 103  CO2 22 - 32 mmol/L 31 32 -  Calcium 8.9 - 10.3 mg/dL 8.2(L) 8.7(L) -  Total Protein 6.0 - 8.3 g/dL - - -  Total Bilirubin 0.3 - 1.2 mg/dL - - -  Alkaline Phos 39 - 117 U/L - - -  AST 0 - 37 U/L - - -  ALT 0 - 53 U/L - - -      Assessment/Plan: (ICD-10's: L72.3) 37 y.o. male with an infected sebaceous cyst to the mid upper back, complicated by pertinent comorbidities including HTN and morbid obesity.  Infected sebaceous cyst,ID, abx, outpatient follow up for elective removal in about 6 weeks   - Although I&D preformed in the ED yesterday, still has area of palpable fluctuance appreciable. Will preformed I&D today and obtain cultures. Discussed the risk, benefits, and alternatives to the procedure and he wishes to procedure  - Continue IV ABx   - Hospitalist primary, appreciate their involvement  All of the above findings and recommendations were discussed with the patient and his family, and all of patient's and his family's questions were answered to their expressed  satisfaction.  Thank you for the opportunity to participate in this patient's care.   -- Lynden Oxford, PA-C [facility] 04/27/2018, 11:28 AM [phone] M-F: 7am - 4pm

## 2018-04-27 NOTE — Procedures (Signed)
SURGICAL OPERATIVE REPORT  DATE OF PROCEDURE: 04/27/2018  ATTENDING: Barbara Cower E. Earlene Plater, MD  ANESTHESIA: Local  PRE-OPERATIVE DIAGNOSIS: Large and painful Left of mid-upper back cutaneous abscess attributed to infected sebaceous cyst (icd-10's: L02.212, L72.3)  POST-OPERATIVE DIAGNOSIS: Large and painful Left of mid-upper back cutaneous abscess attributed to infected sebaceous cyst (icd-10's: L02.212, L72.3)  PROCEDURE(S):  1.) Incision and drainage of large and painful Left of mid-upper back cutaneous abscess attributed to infected sebaceous cyst (cpt: 10061)  INTRAOPERATIVE FINDINGS: ~250 mL of foul-smelling brownish/greenish purulent fluid under significant pressure from large and painful Left of mid-upper back cutaneous abscess attributed to infected sebaceous cyst  INTRAVENOUS FLUIDS: 0 mL crystalloid   ESTIMATED BLOOD LOSS: Minimal (<20 mL)   URINE OUTPUT: No Foley catheter   SPECIMENS: Contents of Large and painful Left of mid-upper back cutaneous abscess for culture  IMPLANTS: None  DRAINS: None (abscess cavity packed with dry gauze)  COMPLICATIONS: None apparent  CONDITION AT END OF PROCEDURE: Hemodynamically stable and awake  DISPOSITION OF PATIENT: Med-surg bed in same condition as pre-procedure  INDICATIONS FOR PROCEDURE:  Patient is a 37 y.o. male who presented to [facility] ED with Left mid-upper back pain and erythema/swelling and subjective fever/chills, for which patient was started on antibiotics and diagnosed with a large Left mid-upper back cutaneous abscess attributed to an infected sebaceous cyst. All risks, benefits, and alternatives to incision and drainage of Left mid-upper back abscess were discussed with the patient, all of patient's questions were answered to his expressed satisfaction, and informed consent was obtained and documented.  DETAILS OF PROCEDURE: Remaining in patient's med-surg bed,  patient was appropriately identified and positioned with his Right side down. Operative site was prepped and draped in the usual sterile fashion, and following a brief time out, the focus of maximal fluctuance was identified and a 2 cm incision was made using a disposable scalpel, upon which >250 mL of foul-smelling brownish/greenish-purulent fluid was immediately released under significant pressure and deep abscess culture was obtained. Additional fluid was then expressed, and intra-abscess septations/loculations were then disrupted using the flat non-sharp plastic end of the disposable scalpel.   A dry gauze was inserted and removed, after which sterile gauze was advanced into the former abscess cavity to promote drainage and prevent closure of the skin over the drained abscess cavity. Surrounding skin was then cleaned and dried, and a sterile dry gauze and adhesive dressing was applied. Patient described immediate relief from his pre-procedural pressure and pain and was left in same condition as pre-procedure.  I was present for all aspects of the above procedure, and no operative complications were apparent.

## 2018-04-27 NOTE — ED Notes (Signed)
Attempted to give report to the floor nurse, was told they are not taking any patients at this time and will call me back

## 2018-04-27 NOTE — H&P (Signed)
[facility] [place] [facility]   PATIENT NAME: Clifford Nichols    MR#:  [id number]  DATE OF BIRTH:  17-Jul-1981  DATE OF ADMISSION:  04/26/2018  PRIMARY CARE PHYSICIAN: Patient, No Pcp Per   REQUESTING/REFERRING PHYSICIAN: Manson Passey, MD  CHIEF COMPLAINT:   Chief Complaint  Patient presents with  . Abscess    HISTORY OF PRESENT ILLNESS:  Clifford Nichols  is a 37 y.o. male who presents with chief complaint as above.  Patient presents with cellulitis around a superinfected sebaceous cyst on his back.  He presented tachycardic with leukocytosis, meeting sepsis criteria.  He has surrounding cellulitis.  Abscess was drained by ED physician.  Hospitalist were called for admission  PAST MEDICAL HISTORY:   Past Medical History:  Diagnosis Date  . Hypertension      PAST SURGICAL HISTORY:   Past Surgical History:  Procedure Laterality Date  . NO PAST SURGERIES       SOCIAL HISTORY:   Social History   Tobacco Use  . Smoking status: Former Smoker    Packs/day: 0.50    Types: Cigarettes  . Smokeless tobacco: Never Used  Substance Use Topics  . Alcohol use: Yes     FAMILY HISTORY:   Family History  Problem Relation Age of Onset  . Hypertension Father      DRUG ALLERGIES:  No Known Allergies  MEDICATIONS AT HOME:   Prior to Admission medications   Not on File    REVIEW OF SYSTEMS:  Review of Systems  Constitutional: Negative for chills, fever, malaise/fatigue and weight loss.  HENT: Negative for ear pain, hearing loss and tinnitus.   Eyes: Negative for blurred vision, double vision, pain and redness.  Respiratory: Negative for cough, hemoptysis and shortness of breath.   Cardiovascular: Negative for chest pain, palpitations, orthopnea and leg swelling.  Gastrointestinal: Negative for abdominal pain, constipation, diarrhea, nausea and vomiting.  Genitourinary: Negative for dysuria, frequency and hematuria.  Musculoskeletal: Negative  for back pain, joint pain and neck pain.  Skin:       Erythema swelling and tenderness on his back  Neurological: Negative for dizziness, tremors, focal weakness and weakness.  Endo/Heme/Allergies: Negative for polydipsia. Does not bruise/bleed easily.  Psychiatric/Behavioral: Negative for depression. The patient is not nervous/anxious and does not have insomnia.      VITAL SIGNS:   Vitals:   04/26/18 2144 04/27/18 0030 04/27/18 0100  BP: (!) 143/85 (!) 149/78 (!) 144/71  Pulse: (!) 128 (!) 116 (!) 112  Resp: 20    Temp: 100.2 F (37.9 C)    TempSrc: Oral    SpO2: 95% 90% 92%  Weight: (!) 181.4 kg    Height: 5\' 7"  (1.702 m)     Wt Readings from Last 3 Encounters:  04/26/18 (!) 181.4 kg  02/09/15 (!) 165.6 kg    PHYSICAL EXAMINATION:  Physical Exam  Vitals reviewed. Constitutional: He is oriented to person, place, and time. He appears well-developed and well-nourished. No distress.  HENT:  Head: Normocephalic and atraumatic.  Mouth/Throat: Oropharynx is clear and moist.  Eyes: Pupils are equal, round, and reactive to light. Conjunctivae and EOM are normal. No scleral icterus.  Neck: Normal range of motion. Neck supple. No JVD present. No thyromegaly present.  Cardiovascular: Normal rate, regular rhythm and intact distal pulses. Exam reveals no gallop and no friction rub.  No murmur heard. Respiratory: Effort normal and breath sounds normal. No respiratory distress. He has no wheezes. He has no  rales.  GI: Soft. Bowel sounds are normal. He exhibits no distension. There is no tenderness.  Musculoskeletal: Normal range of motion. He exhibits no edema.  No arthritis, no gout  Lymphadenopathy:    He has no cervical adenopathy.  Neurological: He is alert and oriented to person, place, and time. No cranial nerve deficit.  No dysarthria, no aphasia  Skin: Skin is warm and dry. No rash noted. There is erythema (Swelling and tenderness around the abscess on his back, now lanced by  ED physician).  Psychiatric: He has a normal mood and affect. His behavior is normal. Judgment and thought content normal.    LABORATORY PANEL:   CBC Recent Labs  Lab 04/26/18 2228  WBC 13.5*  HGB 13.1  HCT 40.1  PLT 182   ------------------------------------------------------------------------------------------------------------------  Chemistries  Recent Labs  Lab 04/26/18 2228  NA 138  K 3.3*  CL 97*  CO2 32  GLUCOSE 181*  BUN 7  CREATININE 1.21  CALCIUM 8.7*   ------------------------------------------------------------------------------------------------------------------  Cardiac Enzymes No results for input(s): TROPONINI in the last 168 hours. ------------------------------------------------------------------------------------------------------------------  RADIOLOGY:  Ct Chest W Contrast  Result Date: 04/27/2018 CLINICAL DATA:  Upper back abscess EXAM: CT CHEST WITH CONTRAST TECHNIQUE: Multidetector CT imaging of the chest was performed during intravenous contrast administration. CONTRAST:  75mL OMNIPAQUE IOHEXOL 350 MG/ML SOLN COMPARISON:  None. FINDINGS: Cardiovascular: Heart is normal in size.  No pericardial effusion. No evidence of thoracic aortic aneurysm. Mediastinum/Nodes: No suspicious mediastinal lymphadenopathy. Small right axillary nodes measuring up to 11 mm short axis. Visualized thyroid is unremarkable. Lungs/Pleura: 12 mm platelike subpleural nodular opacity in the medial right lower lobe (series 3/image 123), with a triangular configuration on coronal imaging (coronal image 134), at the site of prior infection/pneumonia on remote CT abdomen/pelvis in 2011. This likely reflects post infectious/inflammatory scarring. No suspicious pulmonary nodules. Lungs are otherwise clear. No focal consolidation. No pleural effusion or pneumothorax. Upper Abdomen: Visualized upper abdomen is notable for hepatic steatosis. Musculoskeletal: 2.5 x 3.8 x 3.5 cm cystic  lesion in the subcutaneous tissues of the right paramidline back (series 2/image 36), at the level of the upper thoracic spine, corresponding to the patient's known back abscess. Underlying subcutaneous stranding with overlying soft tissue thickening, reflecting associated cellulitis. Visualized osseous structures are within normal limits. IMPRESSION: 3.8 cm abscess in the right paramidline back. Associated cellulitis. 12 mm subpleural nodular opacity in the medial right lower lobe, at the site of prior infection/pneumonia on remote CT, favoring post infectious/inflammatory scarring. No suspicious pulmonary nodules. Electronically Signed   By: Charline Bills M.D.   On: 04/27/2018 00:09    EKG:   Orders placed or performed during the hospital encounter of 04/26/18  . ED EKG 12-Lead  . ED EKG 12-Lead  . EKG 12-Lead  . EKG 12-Lead    IMPRESSION AND PLAN:  Principal Problem:   Sepsis (HCC) -IV antibiotics initiated, lactic acid was within normal limits, blood pressure stable, cause of his sepsis is cellulitis with abscess, cultures sent Active Problems:   Cellulitis and abscess of upper extremity -IV antibiotics as above, he will need frequent packing changes   HTN (hypertension) -stable, continue home meds  Chart review performed and case discussed with ED provider. Labs, imaging and/or ECG reviewed by provider and discussed with patient/family. Management plans discussed with the patient and/or family.  DVT PROPHYLAXIS: SubQ lovenox   GI PROPHYLAXIS:  None  ADMISSION STATUS: Inpatient     CODE STATUS: Full  TOTAL TIME TAKING  CARE OF THIS PATIENT: 45 minutes.   Marielle Mantione FIELDING 04/27/2018, 1:57 AM  [employer]  [phone]  CC: Primary care physician; Patient, No Pcp Per  Note:  This document was prepared using Dragon voice recognition software and may include unintentional dictation errors.

## 2018-04-27 NOTE — Plan of Care (Signed)
°  Problem: Coping: °Goal: Level of anxiety will decrease °Outcome: Progressing °  °

## 2018-04-28 LAB — VANCOMYCIN, TROUGH: VANCOMYCIN TR: 9 ug/mL — AB (ref 15–20)

## 2018-04-28 LAB — GLUCOSE, CAPILLARY
Glucose-Capillary: 133 mg/dL — ABNORMAL HIGH (ref 70–99)
Glucose-Capillary: 138 mg/dL — ABNORMAL HIGH (ref 70–99)
Glucose-Capillary: 120 mg/dL — ABNORMAL HIGH (ref 70–99)
Glucose-Capillary: 169 mg/dL — ABNORMAL HIGH (ref 70–99)

## 2018-04-28 LAB — HIV ANTIBODY (ROUTINE TESTING W REFLEX): HIV SCREEN 4TH GENERATION: NONREACTIVE

## 2018-04-28 MED ORDER — SODIUM CHLORIDE 0.9 % IV SOLN
INTRAVENOUS | Status: DC | PRN
  Administered 2018-04-28 – 2018-04-29 (×4): 500 mL via INTRAVENOUS

## 2018-04-28 MED ORDER — OXYCODONE HCL 5 MG PO TABS
5.0000 mg | ORAL_TABLET | Freq: Four times a day (QID) | ORAL | Status: DC | PRN
  Administered 2018-04-28 – 2018-04-29 (×3): 5 mg via ORAL
  Filled 2018-04-28 (×3): qty 1

## 2018-04-28 MED ORDER — VANCOMYCIN HCL 10 G IV SOLR
1750.0000 mg | Freq: Three times a day (TID) | INTRAVENOUS | Status: DC
  Administered 2018-04-28 – 2018-04-29 (×4): 1750 mg via INTRAVENOUS
  Filled 2018-04-28 (×6): qty 1750

## 2018-04-28 NOTE — Progress Notes (Signed)
[facility] - [place] at [facility]   PATIENT NAME: Clifford Nichols    MR#:  [id number]  DATE OF BIRTH:  03/27/1981  SUBJECTIVE:  came in with increasing pain in the back along with drainage. Patient had sebaceous cyst for last 10 years Incision and drainage was done on 04/27/2018 by general surgery, feeling better today  REVIEW OF SYSTEMS:   Review of Systems  Constitutional: Negative for chills, fever and weight loss.  HENT: Negative for ear discharge, ear pain and nosebleeds.   Eyes: Negative for blurred vision, pain and discharge.  Respiratory: Negative for sputum production, shortness of breath, wheezing and stridor.   Cardiovascular: Negative for chest pain, palpitations, orthopnea and PND.  Gastrointestinal: Negative for abdominal pain, diarrhea, nausea and vomiting.  Genitourinary: Negative for frequency and urgency.  Musculoskeletal: Positive for back pain. Negative for joint pain.  Neurological: Negative for sensory change, speech change, focal weakness and weakness.  Psychiatric/Behavioral: Negative for depression and hallucinations. The patient is not nervous/anxious.    Tolerating Diet:yes Tolerating PT:   DRUG ALLERGIES:  No Known Allergies  VITALS:  Blood pressure 121/74, pulse (!) 110, temperature 98.4 F (36.9 C), temperature source Oral, resp. rate 17, height 5\' 7"  (1.702 m), weight (!) 173.4 kg, SpO2 94 %.  PHYSICAL EXAMINATION:   Physical Exam  GENERAL:  37 y.o.-year-old patient lying in the bed with no acute distress.  EYES: Pupils equal, round, reactive to light and accommodation. No scleral icterus. Extraocular muscles intact.  HEENT: Head atraumatic, normocephalic. Oropharynx and nasopharynx clear.  NECK:  Supple, no jugular venous distention. No thyroid enlargement, no tenderness.  LUNGS: Normal breath sounds bilaterally, no wheezing, rales, rhonchi. No use of accessory muscles of respiration.  CARDIOVASCULAR: S1, S2 normal. No  murmurs, rubs, or gallops.  ABDOMEN: Soft, nontender, nondistended. Bowel sounds present.  EXTREMITIES: No cyanosis, clubbing or edema b/l.    NEUROLOGIC: Cranial nerves II through XII are intact. No focal Motor or sensory deficits b/l.   PSYCHIATRIC:  patient is alert and oriented x 3.  SKIN: No obvious rash, lesion, or ulcer. Sebaceous cyst status post incision and drainage on right upper back with serosanguineous drainage  with surrounding erythema  LABORATORY PANEL:  CBC Recent Labs  Lab 04/27/18 0644  WBC 13.2*  HGB 12.5*  HCT 37.8*  PLT 169    Chemistries  Recent Labs  Lab 04/27/18 0644  NA 140  K 3.7  CL 99  CO2 31  GLUCOSE 164*  BUN 6  CREATININE 1.09  CALCIUM 8.2*   Cardiac Enzymes No results for input(s): TROPONINI in the last 168 hours. RADIOLOGY:  Ct Chest W Contrast  Result Date: 04/27/2018 CLINICAL DATA:  Upper back abscess EXAM: CT CHEST WITH CONTRAST TECHNIQUE: Multidetector CT imaging of the chest was performed during intravenous contrast administration. CONTRAST:  75mL OMNIPAQUE IOHEXOL 350 MG/ML SOLN COMPARISON:  None. FINDINGS: Cardiovascular: Heart is normal in size.  No pericardial effusion. No evidence of thoracic aortic aneurysm. Mediastinum/Nodes: No suspicious mediastinal lymphadenopathy. Small right axillary nodes measuring up to 11 mm short axis. Visualized thyroid is unremarkable. Lungs/Pleura: 12 mm platelike subpleural nodular opacity in the medial right lower lobe (series 3/image 123), with a triangular configuration on coronal imaging (coronal image 134), at the site of prior infection/pneumonia on remote CT abdomen/pelvis in 2011. This likely reflects post infectious/inflammatory scarring. No suspicious pulmonary nodules. Lungs are otherwise clear. No focal consolidation. No pleural effusion or pneumothorax. Upper Abdomen: Visualized upper abdomen is notable  for hepatic steatosis. Musculoskeletal: 2.5 x 3.8 x 3.5 cm cystic lesion in the  subcutaneous tissues of the right paramidline back (series 2/image 36), at the level of the upper thoracic spine, corresponding to the patient's known back abscess. Underlying subcutaneous stranding with overlying soft tissue thickening, reflecting associated cellulitis. Visualized osseous structures are within normal limits. IMPRESSION: 3.8 cm abscess in the right paramidline back. Associated cellulitis. 12 mm subpleural nodular opacity in the medial right lower lobe, at the site of prior infection/pneumonia on remote CT, favoring post infectious/inflammatory scarring. No suspicious pulmonary nodules. Electronically Signed   By: Charline Bills M.D.   On: 04/27/2018 00:09   ASSESSMENT AND PLAN:   Clifford Nichols  is a 37 y.o. male who presents with chief complaint as above.  Patient presents with cellulitis around a superinfected sebaceous cyst on his back.  He presented tachycardic with leukocytosis, meeting sepsis criteria.  He has surrounding cellulitis.    *Sepsis (HCC) -IV antibiotics initiated, lactic acid was within normal limits, blood pressure stable, cause of his sepsis is cellulitis with abscess of infected sebaceous cyst - cultures from wound gram-positive cocci in pairs and in clusters Blood cultures collected from 04/26/2018 no growth in 2 days -IV vancomycin for now--change to oral bactrim at discharge -surgical consultation with Dr. Earlene Plater.--- Status post I N D, Dr. Everlene Farrier is following through the weekend   *Cellulitis and abscess of upper extremity -IV antibiotics as above -Follow-up with surgery  *  HTN (hypertension) -stable, continue home meds -pt is on beta-blockers and hydrochlorothiazide  *Hyperglycemia -check A1c -sliding scale insulin -start metformin if sugars remain elevated including A1c.  DVT prophylaxis subcu Lovenox.  Above was discussed with patient and family  Case discussed with Care Management/Social Worker. Management plans discussed with the patient,  family and they are in agreement.  CODE STATUS: full  DVT Prophylaxis: lovenox  TOTAL TIME TAKING CARE OF THIS PATIENT: *30* minutes.  >50% time spent on counselling and coordination of care  POSSIBLE D/C IN *1-2* DAYS, DEPENDING ON CLINICAL CONDITION.  Note: This dictation was prepared with Dragon dictation along with smaller phrase technology. Any transcriptional errors that result from this process are unintentional.  Ramonita Lab M.D on 04/28/2018 at 1:08 PM  Between 7am to 6pm - Pager - [phone]  After 6pm go to [email or web address] - password [email or web address]  [facility]  Office  [phone]  CC: Primary care physician; Patient, No Pcp PerPatient ID: Clifford Nichols, male   DOB: Feb 01, 1981, 37 y.o.   MRN: [id number]

## 2018-04-28 NOTE — Progress Notes (Signed)
Pharmacy Antibiotic Note  Clifford Nichols is a 37 y.o. male admitted on 04/26/2018 with cellulitis.  Pharmacy has been consulted for vancomycin dosing.  Plan: DW 112kg  Vd 78L kei 9.1 hr-1  T1/2 7 hours Vancomycin Trough level 9. Will increase Vancomycin 1750 mg IV q8 hours. Will recheck Vancomycin Trough level @ 0830 on 10/6.    Height: 5\' 7"  (170.2 cm) Weight: (!) 382 lb 4.8 oz (173.4 kg) IBW/kg (Calculated) : 66.1  Temp (24hrs), Avg:100 F (37.8 C), Min:99.7 F (37.6 C), Max:100.2 F (37.9 C)  Recent Labs  Lab 04/26/18 2228 04/27/18 0644 04/28/18 0723  WBC 13.5* 13.2*  --   CREATININE 1.21 1.09  --   LATICACIDVEN 1.5  --   --   VANCOTROUGH  --   --  9*    Estimated Creatinine Clearance: 145.8 mL/min (by C-G formula based on SCr of 1.09 mg/dL).    No Known Allergies  Antimicrobials this admission: Ceftriaxone, vancomycin 10/3  >>    >>   Dose adjustments this admission:   Microbiology results: 10/3  BCx: pending    Thank you for allowing pharmacy to be a part of this patient's care.  Jamaury Gumz D 04/28/2018 8:00 AM

## 2018-04-28 NOTE — Care Management (Addendum)
Patient has medication coverage. RNCM will follow.

## 2018-04-28 NOTE — Progress Notes (Signed)
Seen and examined  S/p I/D  Low grade temp Gram +  PE NAD Some induration and some pus was expressed. Packing changed by RN. No evidence of necrotizing infection  A/P Doing well Benefit from another day of IV A/Bs Daily packing F/u w our office next week

## 2018-04-28 NOTE — Progress Notes (Signed)
Dressing to back removed by md. Per md pack with 1/2 inch packing and cover with gauze. Moderate amount of serosanguinous drainage noted.

## 2018-04-29 DIAGNOSIS — L723 Sebaceous cyst: Secondary | ICD-10-CM

## 2018-04-29 DIAGNOSIS — L02212 Cutaneous abscess of back [any part, except buttock]: Secondary | ICD-10-CM

## 2018-04-29 LAB — BASIC METABOLIC PANEL
ANION GAP: 11 (ref 5–15)
BUN: 9 mg/dL (ref 6–20)
CO2: 33 mmol/L — ABNORMAL HIGH (ref 22–32)
Calcium: 8.6 mg/dL — ABNORMAL LOW (ref 8.9–10.3)
Chloride: 92 mmol/L — ABNORMAL LOW (ref 98–111)
Creatinine, Ser: 0.89 mg/dL (ref 0.61–1.24)
Glucose, Bld: 131 mg/dL — ABNORMAL HIGH (ref 70–99)
POTASSIUM: 3.4 mmol/L — AB (ref 3.5–5.1)
SODIUM: 136 mmol/L (ref 135–145)

## 2018-04-29 LAB — VANCOMYCIN, TROUGH: Vancomycin Tr: 12 ug/mL — ABNORMAL LOW (ref 15–20)

## 2018-04-29 LAB — CBC
HCT: 40.5 % (ref 40.0–52.0)
HEMOGLOBIN: 12.7 g/dL — AB (ref 13.0–18.0)
MCH: 29.7 pg (ref 26.0–34.0)
MCHC: 31.3 g/dL — ABNORMAL LOW (ref 32.0–36.0)
MCV: 95.1 fL (ref 80.0–100.0)
Platelets: 203 10*3/uL (ref 150–440)
RBC: 4.26 MIL/uL — ABNORMAL LOW (ref 4.40–5.90)
RDW: 13.2 % (ref 11.5–14.5)
WBC: 11.7 10*3/uL — ABNORMAL HIGH (ref 3.8–10.6)

## 2018-04-29 LAB — GLUCOSE, CAPILLARY
Glucose-Capillary: 120 mg/dL — ABNORMAL HIGH (ref 70–99)
Glucose-Capillary: 116 mg/dL — ABNORMAL HIGH (ref 70–99)

## 2018-04-29 LAB — HEMOGLOBIN A1C
HEMOGLOBIN A1C: 6 % — AB (ref 4.8–5.6)
MEAN PLASMA GLUCOSE: 125.5 mg/dL

## 2018-04-29 MED ORDER — SULFAMETHOXAZOLE-TRIMETHOPRIM 800-160 MG PO TABS: 2.0000 | ORAL_TABLET | Freq: Two times a day (BID) | ORAL | 0 refills | Status: AC

## 2018-04-29 MED ORDER — ACETAMINOPHEN 325 MG PO TABS: 650.0000 mg | ORAL_TABLET | Freq: Four times a day (QID) | ORAL | Status: DC | PRN

## 2018-04-29 MED ORDER — METOPROLOL TARTRATE 25 MG PO TABS: 12.5000 mg | ORAL_TABLET | Freq: Two times a day (BID) | ORAL | 0 refills | Status: DC

## 2018-04-29 MED ORDER — HYDROCHLOROTHIAZIDE 25 MG PO TABS: 25.0000 mg | ORAL_TABLET | Freq: Every day | ORAL | 0 refills | Status: DC

## 2018-04-29 MED ORDER — SULFAMETHOXAZOLE-TRIMETHOPRIM 800-160 MG PO TABS
1.0000 | ORAL_TABLET | Freq: Two times a day (BID) | ORAL | Status: DC
  Filled 2018-04-29 (×2): qty 1

## 2018-04-29 MED ORDER — OXYCODONE HCL 5 MG PO TABS: 5.0000 mg | ORAL_TABLET | Freq: Four times a day (QID) | ORAL | 0 refills | Status: DC | PRN

## 2018-04-29 NOTE — Plan of Care (Signed)
Patient waiting for discharge today. Family instructed on daily dressing changes to abscess. Encouraged hand hygiene. Patient will be home and antibiotic

## 2018-04-29 NOTE — Progress Notes (Signed)
Work note was printed and family to come pick up

## 2018-04-29 NOTE — Care Management (Signed)
RNCM spoke with patient prior to discharge. He was having problems paying for medications however now he states he has insurance coverage but will contact RNCM if he has any problems.

## 2018-04-29 NOTE — Progress Notes (Signed)
Clifford Nichols to be D/C'd Home per MD order.  Discussed prescriptions and follow up appointments with the patient. Prescriptions given to patient, medication list explained in detail. Pt verbalized understanding.  Allergies as of 04/29/2018   No Known Allergies     Medication List    TAKE these medications   acetaminophen 325 MG tablet Commonly known as:  TYLENOL Take 2 tablets (650 mg total) by mouth every 6 (six) hours as needed for mild pain (or Fever >/= 101).   hydrochlorothiazide 25 MG tablet Commonly known as:  HYDRODIURIL Take 1 tablet (25 mg total) by mouth daily.   metoprolol tartrate 25 MG tablet Commonly known as:  LOPRESSOR Take 0.5 tablets (12.5 mg total) by mouth 2 (two) times daily.   oxyCODONE 5 MG immediate release tablet Commonly known as:  Oxy IR/ROXICODONE Take 1 tablet (5 mg total) by mouth every 6 (six) hours as needed for moderate pain or breakthrough pain.   sulfamethoxazole-trimethoprim 800-160 MG tablet Commonly known as:  BACTRIM DS,SEPTRA DS Take 2 tablets by mouth every 12 (twelve) hours for 10 days.       Vitals:   04/29/18 0419 04/29/18 0717  BP:  117/66  Pulse:  (!) 106  Resp:    Temp:  98.8 F (37.1 C)  SpO2: 92% 93%    Skin clean, dry and intact without evidence of skin break down, no evidence of skin tears noted. IV catheter discontinued intact. Site without signs and symptoms of complications. Dressing and pressure applied. Pt denies pain at this time. No complaints noted.  An After Visit Summary was printed and given to the patient. Patient escorted via WC, and D/C home via private auto.  Clifford Nichols Clifford Nichols

## 2018-04-29 NOTE — Progress Notes (Signed)
Dressing to patients back removed, wife at bedside to assist and demonstrate dressing change. Supplies for dressing change will be sent home with patient

## 2018-04-29 NOTE — Progress Notes (Signed)
Pharmacy Antibiotic Note  Clifford Nichols is a 37 y.o. male admitted on 04/26/2018 with cellulitis.  Pharmacy has been consulted for vancomycin dosing.  Plan: DW 112kg  Vd 78L kei 9.1 hr-1  T1/2 7 hours Vancomycin Trough level 12. Will continue Vancomycin 1750 mg IV q8 hours.    Height: 5\' 7"  (170.2 cm) Weight: (!) 382 lb 4.8 oz (173.4 kg) IBW/kg (Calculated) : 66.1  Temp (24hrs), Avg:99.3 F (37.4 C), Min:98.7 F (37.1 C), Max:100.2 F (37.9 C)  Recent Labs  Lab 04/26/18 2228 04/27/18 0644 04/28/18 0723 04/29/18 0310 04/29/18 0847  WBC 13.5* 13.2*  --  11.7*  --   CREATININE 1.21 1.09  --  0.89  --   LATICACIDVEN 1.5  --   --   --   --   VANCOTROUGH  --   --  9*  --  12*    Estimated Creatinine Clearance: 178.6 mL/min (by C-G formula based on SCr of 0.89 mg/dL).    No Known Allergies  Antimicrobials this admission: Ceftriaxone, vancomycin 10/3  >>    >>   Dose adjustments this admission:   Microbiology results: 10/3  BCx: pending    Thank you for allowing pharmacy to be a part of this patient's care.  Carlota Philley D 04/29/2018 10:11 AM

## 2018-04-29 NOTE — Discharge Instructions (Signed)
Follow-up with primary care physician in 3 to 4 days Follow-up with surgery in 5 days Daily dressing changes as instructed

## 2018-04-29 NOTE — Discharge Summary (Signed)
[facility] - [place] at [facility]   dsPATIENT NAME: Clifford Nichols    MR#:  [id number]  DATE OF BIRTH:  02/01/1981  DATE OF ADMISSION:  04/26/2018 ADMITTING PHYSICIAN: Arnaldo Natal, MD  DATE OF DISCHARGE: 04/29/18   PRIMARY CARE PHYSICIAN: Patient, No Pcp Per    ADMISSION DIAGNOSIS:  Abscess [L02.91] Cellulitis of back [L03.312] Sepsis, due to unspecified organism, unspecified whether acute organ dysfunction present (HCC) [A41.9]  DISCHARGE DIAGNOSIS:  Principal Problem:   Sepsis (HCC) Active Problems:   Cellulitis and abscess of upper extremity   HTN (hypertension)   SECONDARY DIAGNOSIS:   Past Medical History:  Diagnosis Date  . Hypertension     HOSPITAL COURSE:   JermaineYoungis a37 y.o.malewho presents with chief complaint as above. Patient presents with cellulitis around a superinfected sebaceous cyst on his back. He presented tachycardic with leukocytosis, meeting sepsis criteria. He has surrounding cellulitis.   *Sepsis (HCC) -IV antibiotics initiated, lactic acid was within normal limits, blood pressure stable, cause of his sepsis is cellulitis with abscess of infected sebaceous cyst - cultures from wound gram-positive cocci in pairs and in clusters Discontinue vancomycin discharge patient with Bactrim double strength twice a day for 10 days Blood cultures collected from 04/26/2018 no growth in 2 days -IV vancomycin in the hospital course.  Clinically improving--change to oral bactrim ds at discharge -surgical consultation with Dr. Earlene Plater.--- Status post I N D, Dr. Everlene Farrier is okay to discharge patient from his standpoint Daily dressing changes and outpatient follow-up with Dr. Everlene Farrier in 5 days Dressing instructions were discussed and taught to patient's wife  *Cellulitis and abscess of upper back  *HTN (hypertension) -stable, continue home meds -pt is on beta-blockers and hydrochlorothiazide  *Hyperglycemia -check  A1c-outpatient -sliding scale insulin during hospital course -PCP to consider to start metformin if sugars remain elevated including A1c.  DVT prophylaxis subcu Lovenox.  Above was discussed with patient and family  DISCHARGE CONDITIONS:   Stable   CONSULTS OBTAINED:     PROCEDURES status post IND of the sebaceous cyst/abscess  DRUG ALLERGIES:  No Known Allergies  DISCHARGE MEDICATIONS:   Allergies as of 04/29/2018   No Known Allergies     Medication List    TAKE these medications   acetaminophen 325 MG tablet Commonly known as:  TYLENOL Take 2 tablets (650 mg total) by mouth every 6 (six) hours as needed for mild pain (or Fever >/= 101).   hydrochlorothiazide 25 MG tablet Commonly known as:  HYDRODIURIL Take 1 tablet (25 mg total) by mouth daily.   metoprolol tartrate 25 MG tablet Commonly known as:  LOPRESSOR Take 0.5 tablets (12.5 mg total) by mouth 2 (two) times daily.   oxyCODONE 5 MG immediate release tablet Commonly known as:  Oxy IR/ROXICODONE Take 1 tablet (5 mg total) by mouth every 6 (six) hours as needed for moderate pain or breakthrough pain.   sulfamethoxazole-trimethoprim 800-160 MG tablet Commonly known as:  BACTRIM DS,SEPTRA DS Take 2 tablets by mouth every 12 (twelve) hours for 10 days.        DISCHARGE INSTRUCTIONS:    Follow-up with primary care physician in 3 to 4 days Follow-up with surgery in 5 days Daily dressing changes as instructed DIET:  Cardiac diet  DISCHARGE CONDITION:  Fair  ACTIVITY:  Activity as tolerated  OXYGEN:  Home Oxygen: No.   Oxygen Delivery: room air  DISCHARGE LOCATION:  home   If you experience worsening of your admission symptoms, develop shortness  of breath, life threatening emergency, suicidal or homicidal thoughts you must seek medical attention immediately by calling 911 or calling your MD immediately  if symptoms less severe.  You Must read complete instructions/literature along with all  the possible adverse reactions/side effects for all the Medicines you take and that have been prescribed to you. Take any new Medicines after you have completely understood and accpet all the possible adverse reactions/side effects.   Please note  You were cared for by a hospitalist during your hospital stay. If you have any questions about your discharge medications or the care you received while you were in the hospital after you are discharged, you can call the unit and asked to speak with the hospitalist on call if the hospitalist that took care of you is not available. Once you are discharged, your primary care physician will handle any further medical issues. Please note that NO REFILLS for any discharge medications will be authorized once you are discharged, as it is imperative that you return to your primary care physician (or establish a relationship with a primary care physician if you do not have one) for your aftercare needs so that they can reassess your need for medications and monitor your lab values.     Today  Chief Complaint  Patient presents with  . Abscess   Patient is feeling much better.  No fevers.  Okay to discharge patient from surgery standpoint  ROS:  CONSTITUTIONAL: Denies fevers, chills. Denies any fatigue, weakness.  EYES: Denies blurry vision, double vision, eye pain. EARS, NOSE, THROAT: Denies tinnitus, ear pain, hearing loss. RESPIRATORY: Denies cough, wheeze, shortness of breath.  CARDIOVASCULAR: Denies chest pain, palpitations, edema.  GASTROINTESTINAL: Denies nausea, vomiting, diarrhea, abdominal pain. Denies bright red blood per rectum. GENITOURINARY: Denies dysuria, hematuria. ENDOCRINE: Denies nocturia or thyroid problems. HEMATOLOGIC AND LYMPHATIC: Denies easy bruising or bleeding. SKIN: Upper back status post I&D of the abscess MUSCULOSKELETAL: Denies pain in neck, back, shoulder, knees, hips or arthritic symptoms.  NEUROLOGIC: Denies paralysis,  paresthesias.  PSYCHIATRIC: Denies anxiety or depressive symptoms.   VITAL SIGNS:  Blood pressure 117/66, pulse (!) 106, temperature 98.8 F (37.1 C), temperature source Oral, resp. rate 20, height 5\' 7"  (1.702 m), weight (!) 173.4 kg, SpO2 93 %.  I/O:    Intake/Output Summary (Last 24 hours) at 04/29/2018 1156 Last data filed at 04/29/2018 1100 Gross per 24 hour  Intake 1901.32 ml  Output 1300 ml  Net 601.32 ml    PHYSICAL EXAMINATION:  GENERAL:  37 y.o.-year-old patient lying in the bed with no acute distress.  EYES: Pupils equal, round, reactive to light and accommodation. No scleral icterus. Extraocular muscles intact.  HEENT: Head atraumatic, normocephalic. Oropharynx and nasopharynx clear.  NECK:  Supple, no jugular venous distention. No thyroid enlargement, no tenderness.  LUNGS: Normal breath sounds bilaterally, no wheezing, rales,rhonchi or crepitation. No use of accessory muscles of respiration.  CARDIOVASCULAR: S1, S2 normal. No murmurs, rubs, or gallops.  ABDOMEN: Soft, non-tender, non-distended. Bowel sounds present. Upper back status post I&D of the abscess with serosanguineous discharge.  EXTREMITIES: No pedal edema, cyanosis, or clubbing.  NEUROLOGIC: Cranial nerves II through XII are intact. Muscle strength 5/5 in all extremities. Sensation intact. Gait not checked.  PSYCHIATRIC: The patient is alert and oriented x 3.  SKIN: No obvious rash, lesion, or ulcer.   DATA REVIEW:   CBC Recent Labs  Lab 04/29/18 0310  WBC 11.7*  HGB 12.7*  HCT 40.5  PLT 203  Chemistries  Recent Labs  Lab 04/29/18 0310  NA 136  K 3.4*  CL 92*  CO2 33*  GLUCOSE 131*  BUN 9  CREATININE 0.89  CALCIUM 8.6*    Cardiac Enzymes No results for input(s): TROPONINI in the last 168 hours.  Microbiology Results  Results for orders placed or performed during the hospital encounter of 04/26/18  Blood culture (routine x 2)     Status: None (Preliminary result)   Collection  Time: 04/26/18 10:20 PM  Result Value Ref Range Status   Specimen Description BLOOD RIGHT ANTECUBITAL  Final   Special Requests   Final    BOTTLES DRAWN AEROBIC AND ANAEROBIC Blood Culture adequate volume   Culture   Final    NO GROWTH 3 DAYS Performed at [facility], [address], [place], [place] [address]    Report Status PENDING  Incomplete  Blood culture (routine x 2)     Status: None (Preliminary result)   Collection Time: 04/26/18 10:54 PM  Result Value Ref Range Status   Specimen Description BLOOD LEFT HAND  Final   Special Requests   Final    BOTTLES DRAWN AEROBIC AND ANAEROBIC Blood Culture results may not be optimal due to an inadequate volume of blood received in culture bottles   Culture   Final    NO GROWTH 3 DAYS Performed at [facility], [address], [place], [place] [address]    Report Status PENDING  Incomplete  Aerobic Culture (superficial specimen)     Status: None (Preliminary result)   Collection Time: 04/27/18  3:08 PM  Result Value Ref Range Status   Specimen Description   Final    ABSCESS BACK Performed at [facility], [address], [place], [place] [address]    Special Requests   Final    NONE Performed at [facility], [address] [place], [place] [address]    Gram Stain   Final    MODERATE WBC PRESENT, PREDOMINANTLY PMN MODERATE GRAM POSITIVE COCCI IN PAIRS IN CLUSTERS Performed at [facility], [address] [place], [place] [address]    Culture PENDING  Incomplete   Report Status PENDING  Incomplete    RADIOLOGY:  Ct Chest W Contrast  Result Date: 04/27/2018 CLINICAL DATA:  Upper back abscess EXAM: CT CHEST WITH CONTRAST TECHNIQUE: Multidetector CT imaging of the chest was performed during intravenous contrast administration. CONTRAST:  75mL OMNIPAQUE IOHEXOL 350 MG/ML SOLN COMPARISON:  None. FINDINGS: Cardiovascular: Heart is normal in size.  No pericardial effusion. No evidence  of thoracic aortic aneurysm. Mediastinum/Nodes: No suspicious mediastinal lymphadenopathy. Small right axillary nodes measuring up to 11 mm short axis. Visualized thyroid is unremarkable. Lungs/Pleura: 12 mm platelike subpleural nodular opacity in the medial right lower lobe (series 3/image 123), with a triangular configuration on coronal imaging (coronal image 134), at the site of prior infection/pneumonia on remote CT abdomen/pelvis in 2011. This likely reflects post infectious/inflammatory scarring. No suspicious pulmonary nodules. Lungs are otherwise clear. No focal consolidation. No pleural effusion or pneumothorax. Upper Abdomen: Visualized upper abdomen is notable for hepatic steatosis. Musculoskeletal: 2.5 x 3.8 x 3.5 cm cystic lesion in the subcutaneous tissues of the right paramidline back (series 2/image 36), at the level of the upper thoracic spine, corresponding to the patient's known back abscess. Underlying subcutaneous stranding with overlying soft tissue thickening, reflecting associated cellulitis. Visualized osseous structures are within normal limits. IMPRESSION: 3.8 cm abscess in the right paramidline back. Associated  cellulitis. 12 mm subpleural nodular opacity in the medial right lower lobe, at the site of prior infection/pneumonia on remote CT, favoring post infectious/inflammatory scarring. No suspicious pulmonary nodules. Electronically Signed   By: Charline Bills M.D.   On: 04/27/2018 00:09    EKG:   Orders placed or performed during the hospital encounter of 04/26/18  . ED EKG 12-Lead  . ED EKG 12-Lead  . EKG 12-Lead  . EKG 12-Lead  . EKG      Management plans discussed with the patient, family and they are in agreement.  CODE STATUS:     Code Status Orders  (From admission, onward)         Start     Ordered   04/27/18 0603  Full code  Continuous     04/27/18 0602        Code Status History    This patient has a current code status but no historical code  status.      TOTAL TIME TAKING CARE OF THIS PATIENT: 41  minutes.   Note: This dictation was prepared with Dragon dictation along with smaller phrase technology. Any transcriptional errors that result from this process are unintentional.   @[facility] @  on 04/29/2018 at 11:56 AM  Between 7am to 6pm - Pager - [phone]  After 6pm go to [email or web address] - password [email or web address]  [facility]  Office  [phone]  CC: Primary care physician; Patient, No Pcp Per

## 2018-05-01 LAB — AEROBIC CULTURE W GRAM STAIN (SUPERFICIAL SPECIMEN)

## 2018-05-01 LAB — CULTURE, BLOOD (ROUTINE X 2)
CULTURE: NO GROWTH
Culture: NO GROWTH
Special Requests: ADEQUATE

## 2018-05-10 ENCOUNTER — Ambulatory Visit (INDEPENDENT_AMBULATORY_CARE_PROVIDER_SITE_OTHER): Payer: BLUE CROSS/BLUE SHIELD | Admitting: Surgery

## 2018-05-10 ENCOUNTER — Encounter: Payer: Self-pay | Admitting: Surgery

## 2018-05-10 VITALS — BP 117/76 | HR 118 | Temp 98.3°F | Ht 67.0 in | Wt 364.0 lb

## 2018-05-10 DIAGNOSIS — L723 Sebaceous cyst: Secondary | ICD-10-CM

## 2018-05-10 DIAGNOSIS — Z4889 Encounter for other specified surgical aftercare: Secondary | ICD-10-CM

## 2018-05-10 DIAGNOSIS — L02212 Cutaneous abscess of back [any part, except buttock]: Secondary | ICD-10-CM

## 2018-05-10 NOTE — Patient Instructions (Signed)
We will see you back in 3 weeks to make sure that you are doing better.  Please continue to pack it with gauze and then cover it with a clean gauze.  Please give [place] a call in case you have any questions or concerns.

## 2018-05-10 NOTE — Progress Notes (Signed)
Surgical Clinic Progress/Follow-up Note   HPI:  37 y.o. Male presents to clinic for post-op follow-up 13 Days s/p inpatient bedside incision and drainage of large and painful Left of mid-upper back abscess attributed to infected sebaceous cyst Earlene Plater, 04/27/2018). Patient reports complete resolution of pre-operative pain, and his wife has been changing his gauze packing daily. He denies any fever/chills, diarrhea, CP, or SOB. He also, of note, identifies another Right posterior shoulder sebaceous cyst he'd like removed.  Review of Systems:  Constitutional: denies fever/chills  Respiratory: denies shortness of breath, wheezing  Cardiovascular: denies chest pain, palpitations  Skin: Denies any other rashes or skin discolorations except post-surgical wounds as per interval history  Vital Signs:  BP 117/76   Pulse (!) 118   Temp 98.3 F (36.8 C) (Skin)   Ht 5\' 7"  (1.702 m)   Wt (!) 364 lb (165.1 kg)   BMI 57.01 kg/m    Physical Exam:  Constitutional:  -- Obese body habitus  -- Awake, alert, and oriented x3  Pulmonary:  -- No crackles -- Equal breath sounds bilaterally -- Breathing non-labored at rest Cardiovascular:  -- S1, S2 present  -- No pericardial rubs  Gastrointestinal:  -- Soft and non-distended, non-tender to palpation, no guarding/rebound tenderness -- No abdominal masses appreciated, pulsatile or otherwise  Musculoskeletal / Integumentary:  -- Wounds or skin discoloration: Left of mid-upper back post-procedural incision without surrounding erythema or drainage, healthy beefy red granulation tissue with some residual grey cyst wall/lining debris in the post-drainage abscess cavity -- Extremities: B/L UE and LE FROM, hands and feet warm  Laboratory studies:  CBC Latest Ref Rng & Units 04/29/2018 04/27/2018 04/26/2018  WBC 3.8 - 10.6 K/uL 11.7(H) 13.2(H) 13.5(H)  Hemoglobin 13.0 - 18.0 g/dL 12.7(L) 12.5(L) 13.1  Hematocrit 40.0 - 52.0 % 40.5 37.8(L) 40.1  Platelets 150  - 440 K/uL 203 169 182   CMP Latest Ref Rng & Units 04/29/2018 04/27/2018 04/26/2018  Glucose 70 - 99 mg/dL 161(W) 960(A) 540(J)  BUN 6 - 20 mg/dL 9 6 7   Creatinine 0.61 - 1.24 mg/dL 8.11 9.14 7.82  Sodium 135 - 145 mmol/L 136 140 138  Potassium 3.5 - 5.1 mmol/L 3.4(L) 3.7 3.3(L)  Chloride 98 - 111 mmol/L 92(L) 99 97(L)  CO2 22 - 32 mmol/L 33(H) 31 32  Calcium 8.9 - 10.3 mg/dL 9.5(A) 2.1(H) 0.8(M)  Total Protein 6.0 - 8.3 g/dL - - -  Total Bilirubin 0.3 - 1.2 mg/dL - - -  Alkaline Phos 39 - 117 U/L - - -  AST 0 - 37 U/L - - -  ALT 0 - 53 U/L - - -   Imaging: No new pertinent imaging available for review  Assessment:  37 y.o. yo Male with a problem list including...  Patient Active Problem List   Diagnosis Date Noted  . Abscess of upper back excluding scapular region   . Sebaceous cyst   . Sepsis (HCC) 04/27/2018  . Cellulitis and abscess of upper extremity 04/27/2018  . HTN (hypertension) 04/27/2018    presents to clinic for post-op follow-up evaluation, doing well 13 Days s/p inpatient bedside incision and drainage of large and painful Left of mid-upper back abscess attributed to infected sebaceous cyst Earlene Plater, 04/27/2018) with a second symptomatic non-infected Right posterior shoulder sebaceous cyst that patient requests be removed.  Plan:              - completed prescribed course of antibiotics  - continue daily dry gauze packing changes as  instructed             - do not submerge incisions under water (baths, swimming)             - apply sunblock particularly to/around incision with direct sun exposure to reduce pigmentation of scar             - return to clinic in 3 weeks to reassess wound and possibly schedule excision of Right posterior shoulder subcutaneous sebaceous cyst and recently infected Left mid-upper back sebaceous cyst  - instructed to call office if any questions or concerns  All of the above recommendations were discussed with the patient and patient's wife,  and all of patient's and family's questions were answered to their expressed satisfaction.  -- Scherrie Gerlach Earlene Plater, MD, RPVI [place]: [facility] General Surgery - Partnering for exceptional care. Office: [phone]

## 2018-05-29 ENCOUNTER — Other Ambulatory Visit: Payer: Self-pay

## 2018-05-29 ENCOUNTER — Ambulatory Visit (INDEPENDENT_AMBULATORY_CARE_PROVIDER_SITE_OTHER): Payer: BLUE CROSS/BLUE SHIELD | Admitting: Surgery

## 2018-05-29 ENCOUNTER — Encounter: Payer: Self-pay | Admitting: Surgery

## 2018-05-29 VITALS — BP 160/90 | HR 91 | Temp 97.8°F | Wt 381.0 lb

## 2018-05-29 DIAGNOSIS — L723 Sebaceous cyst: Secondary | ICD-10-CM

## 2018-05-29 DIAGNOSIS — L02212 Cutaneous abscess of back [any part, except buttock]: Secondary | ICD-10-CM

## 2018-05-29 NOTE — Patient Instructions (Addendum)
Please give [place] a call if you decide not to move forward.  We will schedule your excision of the abscess on your back in 6 weeks.  The patient is scheduled for surgery with Dr Earlene Plater on 07/11/18 at [facility]. He will pre admit by phone. The patient is aware of date and instructions.

## 2018-05-29 NOTE — Progress Notes (Signed)
Surgical Clinic Progress/Follow-up Note   HPI:  37 y.o. Male presents to clinic for follow-up evaluation 1 month s/p inpatient bedside incision and drainage of large and painful Left of mid-upper back abscess attributed to infected sebaceous cyst Earlene Plater, 04/27/2018) with a second symptomatic non-infected Right posterior shoulder sebaceous cyst that patient also requests be removed. Patient reports complete resolution of his pre-incisional upper back pain, denies any fever/chills, N/V, diarrhea, CP, or SOB. His wife states that she has been packing his wound as instructed until recently there has been not remaining cavity left to pack, for which she's been instead applying a clean dry gauze over the wound without further drainage.  Review of Systems:  Constitutional: denies any other weight loss, fever, chills, or sweats  Eyes: denies any other vision changes, history of eye injury  ENT: denies sore throat, hearing problems  Respiratory: denies shortness of breath, wheezing  Cardiovascular: denies chest pain, palpitations  Gastrointestinal: denies abdominal pain, N/V, or diarrhea Musculoskeletal: denies any other joint pains or cramps  Skin: Denies any other rashes or skin discolorations except as per interval history Neurological: denies any other headache, dizziness, weakness  Psychiatric: denies any other depression, anxiety  All other review of systems: otherwise negative   Vital Signs:  BP (!) 160/90   Pulse 91   Temp 97.8 F (36.6 C) (Skin)   Wt (!) 381 lb (172.8 kg)   SpO2 92% Comment: room air  BMI 59.67 kg/m    Physical Exam:  Constitutional:  -- Obese body habitus  -- Awake, alert, and oriented x3  Eyes:  -- Pupils equally round and reactive to light  -- No scleral icterus  Ear, nose, throat:  -- No jugular venous distension  -- No nasal drainage, bleeding Pulmonary:  -- No crackles -- Equal breath sounds bilaterally -- Breathing non-labored at  rest Cardiovascular:  -- S1, S2 present  -- No pericardial rubs  Gastrointestinal:  -- Soft, nontender, non-distended, no guarding/rebound  -- No abdominal masses appreciated, pulsatile or otherwise  Musculoskeletal / Integumentary:  -- Wounds or skin discoloration: <1 cm residual open wound with small amount of peri-incisional crusting/scab, non-tender to palpation without any surrounding erythema or appreciable drainage, mildly tender to palpation 2 cm firm Left posterior shoulder subcutaneous mass with punctate hyperpigmented sinus without surrounding erythema or drainage -- Extremities: B/L UE and LE FROM, hands and feet warm Neurologic:  -- Motor function: intact and symmetric  -- Sensation: intact and symmetric   Laboratory studies:  CBC Latest Ref Rng & Units 04/29/2018 04/27/2018 04/26/2018  WBC 3.8 - 10.6 K/uL 11.7(H) 13.2(H) 13.5(H)  Hemoglobin 13.0 - 18.0 g/dL 12.7(L) 12.5(L) 13.1  Hematocrit 40.0 - 52.0 % 40.5 37.8(L) 40.1  Platelets 150 - 440 K/uL 203 169 182   CMP Latest Ref Rng & Units 04/29/2018 04/27/2018 04/26/2018  Glucose 70 - 99 mg/dL 161(W) 960(A) 540(J)  BUN 6 - 20 mg/dL 9 6 7   Creatinine 0.61 - 1.24 mg/dL 8.11 9.14 7.82  Sodium 135 - 145 mmol/L 136 140 138  Potassium 3.5 - 5.1 mmol/L 3.4(L) 3.7 3.3(L)  Chloride 98 - 111 mmol/L 92(L) 99 97(L)  CO2 22 - 32 mmol/L 33(H) 31 32  Calcium 8.9 - 10.3 mg/dL 9.5(A) 2.1(H) 0.8(M)  Total Protein 6.0 - 8.3 g/dL - - -  Total Bilirubin 0.3 - 1.2 mg/dL - - -  Alkaline Phos 39 - 117 U/L - - -  AST 0 - 37 U/L - - -  ALT 0 -  53 U/L - - -   Imaging: No new pertinent imaging studies available for review   Assessment:  37 y.o. yo Male with a problem list including...  Patient Active Problem List   Diagnosis Date Noted  . Abscess of upper back excluding scapular region   . Sebaceous cyst   . Sepsis (HCC) 04/27/2018  . HTN (hypertension) 04/27/2018    presents to clinic for follow-up evaluation, doing well 1 month s/p  inpatient bedside incision and drainage of large and painful Left of mid-upper back abscess attributed to infected sebaceous cyst Earlene Plater, 04/27/2018) with a second symptomatic non-infected Right posterior shoulder sebaceous cyst that patient also requests be removed.  Plan:   - dry gauze to former abscess site prn for any drainage  - all risks, benefits, and alternatives to excision of Left posterior shoulder sebaceous cyst and large mid-upper back previously infected sebaceous cyst were discussed with the patient and his wife family, all of their questions were answered to their expressed satisfaction, patient expresses he wishes to proceed, and informed consent was obtained.  - will plan for excision of Left posterior shoulder sebaceous cyst and large previously infected mid-upper back sebaceous cyst in 6 weeks (between Thanksgiving and winter holidays)  - will plan for procedure in OR due to back cyst too large at time of drainage for office excision  - anticipate return to clinic 2 weeks following above procedure  - instructed to call office if any questions or concerns  All of the above recommendations were discussed with the patient and patient's wife, and all of patient's and family's questions were answered to their expressed satisfaction.  -- Scherrie Gerlach Earlene Plater, MD, RPVI [place]: [facility] General Surgery - Partnering for exceptional care. Office: [phone]

## 2018-07-02 ENCOUNTER — Encounter
Admission: RE | Admit: 2018-07-02 | Discharge: 2018-07-02 | Disposition: A | Discharge status: Home / Self Care | Payer: BLUE CROSS/BLUE SHIELD | Source: Ambulatory Visit | Attending: Surgery | Admitting: Surgery

## 2018-07-02 ENCOUNTER — Other Ambulatory Visit: Payer: Self-pay

## 2018-07-02 HISTORY — DX: Gastro-esophageal reflux disease without esophagitis: K21.9

## 2018-07-02 NOTE — Pre-Procedure Instructions (Signed)
EKG  ED ECG REPORT I, [place] N BROWN, the attending physician, personally viewed and interpreted this ECG.   Date: 04/27/2018  EKG Time: 12:06 AM  Rate: 131  Rhythm: Sinus tachycardia  Axis: Normal  Intervals: Normal  ST&T Change: None  ____________________________________________  RADIOLOGY I, [place] N BROWN, personally viewed and evaluated these images (plain radiographs) as part of my medical decision making, as well as reviewing the written report by the radiologist.  ED MD interpretation: 3.8 cm right midline upper back abscess on CT per radiologist  Official radiology report(s): Ct Chest W Contrast  Result Date: 04/27/2018 CLINICAL DATA:  Upper back abscess EXAM: CT CHEST WITH CONTRAST TECHNIQUE: Multidetector CT imaging of the chest was performed during intravenous contrast administration. CONTRAST:  75mL OMNIPAQUE IOHEXOL 350 MG/ML SOLN COMPARISON:  None. FINDINGS: Cardiovascular: Heart is normal in size.  No pericardial effusion. No evidence of thoracic aortic aneurysm. Mediastinum/Nodes: No suspicious mediastinal lymphadenopathy. Small right axillary nodes measuring up to 11 mm short axis. Visualized thyroid is unremarkable. Lungs/Pleura: 12 mm platelike subpleural nodular opacity in the medial right lower lobe (series 3/image 123), with a triangular configuration on coronal imaging (coronal image 134), at the site of prior infection/pneumonia on remote CT abdomen/pelvis in 2011. This likely reflects post infectious/inflammatory scarring. No suspicious pulmonary nodules. Lungs are otherwise clear. No focal consolidation. No pleural effusion or pneumothorax. Upper Abdomen: Visualized upper abdomen is notable for hepatic steatosis. Musculoskeletal: 2.5 x 3.8 x 3.5 cm cystic lesion in the subcutaneous tissues of the right paramidline back (series 2/image 36), at the level of the upper thoracic spine, corresponding to the patient's known back abscess. Underlying subcutaneous  stranding with overlying soft tissue thickening, reflecting associated cellulitis. Visualized osseous structures are within normal limits. IMPRESSION: 3.8 cm abscess in the right paramidline back. Associated cellulitis. 12 mm subpleural nodular opacity in the medial right lower lobe, at the site of prior infection/pneumonia on remote CT, favoring post infectious/inflammatory scarring. No suspicious pulmonary nodules. Electronically Signed   By: Charline BillsSriyesh  Krishnan M.D.   On: 04/27/2018 00:09      Critical Care performed:  .Critical Care Performed by: Darci CurrentBrown, [place] N, MD Authorized by: Darci CurrentBrown, [place] N, MD   Critical care provider statement:    Critical care time (minutes):  30   Critical care time was exclusive of:  Separately billable procedures and treating other patients   Critical care was time spent personally by me on the following activities:  Development of treatment plan with patient or surrogate, discussions with consultants, evaluation of patient's response to treatment, examination of patient, obtaining history from patient or surrogate, ordering and performing treatments and interventions, ordering and review of laboratory studies, ordering and review of radiographic studies, pulse oximetry, re-evaluation of patient's condition and review of old charts  .Marland Kitchen.Incision and Drainage Date/Time: 04/27/2018 2:02 AM Performed by: Darci CurrentBrown, [place] N, MD Authorized by: Darci CurrentBrown,  N, MD   Consent:    Consent obtained:  Verbal   Consent given by:  Patient   Risks discussed:  Bleeding, infection, incomplete drainage and pain   Alternatives discussed:  Alternative treatment, delayed treatment and observation Location:    Type:  Abscess   Location:  Trunk   Trunk location:  Back Anesthesia (see MAR for exact dosages):    Anesthesia method:  Local infiltration   Local anesthetic:  Lidocaine 1% w/o epi Procedure type:    Complexity:  Complex Procedure details:    Incision  depth:  Submucosal   Scalpel blade:  11   Drainage:  Purulent and bloody   Packing materials:  1/2 in iodoform gauze   Amount 1/2" iodoform:  3 Post-procedure details:    Patient tolerance of procedure:  Tolerated well, no immediate complications     ____________________________________________   INITIAL IMPRESSION / ASSESSMENT AND PLAN / ED COURSE  As part of my medical decision making, I reviewed the following data within the electronic MEDICAL RECORD NUMBER   37 year old male presented with above-stated history and physical exam consistent with upper back abscess with associated cellulitis.  Concern for possible sepsis given tachycardia fever leukocytosis.  Patient given IV vancomycin and cefepime.  Patient discussed with Dr. Anne Hahn for hospital admission for further evaluation and management. ____________________________________________  FINAL CLINICAL IMPRESSION(S) / ED DIAGNOSES  Final diagnoses:  Abscess  Cellulitis of back     MEDICATIONS GIVEN DURING THIS VISIT:  Medications  vancomycin (VANCOCIN) IVPB 1000 mg/200 mL premix (1,000 mg Intravenous New Bag/Given 04/27/18 0036)  cefTRIAXone (ROCEPHIN) 2 g in sodium chloride 0.9 % 100 mL IVPB (0 g Intravenous Stopped 04/27/18 0029)  sodium chloride 0.9 % bolus 1,000 mL (1,000 mLs Intravenous New Bag/Given 04/27/18 0035)  sodium chloride 0.9 % bolus 1,000 mL (0 mLs Intravenous Stopped 04/27/18 0029)  acetaminophen (TYLENOL) tablet 1,000 mg (1,000 mg Oral Given 04/26/18 2252)  HYDROmorphone (DILAUDID) injection 0.5 mg (0.5 mg Intravenous Given 04/26/18 2251)  morphine 4 MG/ML injection 4 mg (4 mg Intravenous Given 04/26/18 2350)  iohexol (OMNIPAQUE) 350 MG/ML injection 75 mL (75 mLs Intravenous Contrast Given 04/26/18 2336)        ED Discharge Orders    None       Note:  This document was prepared using Dragon voice recognition software and may include unintentional dictation errors.    Darci Current, MD 04/27/18 [phone]         Electronically signed by Darci Current, MD at 04/27/2018 2:03 AM     ED to Hosp-Admission (Discharged) on 04/26/2018       Detailed Report

## 2018-07-02 NOTE — Patient Instructions (Signed)
[facility]: [facility] 2nd floor medical mall [facility](Medical Mall Entrance-take elevator on left to 2nd floor.  Check in with surgery information desk.) To find out your arrival time please call [phone] between 1PM - 3PM on 07-10-18 TUESDAY  Remember: Instructions that are not followed completely may result in serious medical risk, up to and including death, or upon the discretion of your surgeon and anesthesiologist your surgery may need to be rescheduled.    _x___ 1. Do not eat food after midnight the night before your procedure. NO GUM OR CANDY AFTER MIDNIGHT.  You may drink clear liquids up to 2 hours before you are scheduled to arrive at the hospital for your procedure.  Do not drink clear liquids within 2 hours of your scheduled arrival to the hospital.  Clear liquids include  --Water or Apple juice without pulp  --Clear carbohydrate beverage such as ClearFast or Gatorade  --Black Coffee or Clear Tea (No milk, no creamers, do not add anything to  the coffee or Tea   ____Ensure clear carbohydrate drink on the way to the hospital for bariatric patients  ____Ensure clear carbohydrate drink 3 hours before surgery for Dr Rutherford NailByrnett's patients if physician instructed.    __x__ 2. No Alcohol for 24 hours before or after surgery.   __x__3. No Smoking or e-cigarettes for 24 prior to surgery.  Do not use any chewable tobacco products for at least 6 hour prior to surgery   ____  4. Bring all medications with you on the day of surgery if instructed.    __x__ 5. Notify your doctor if there is any change in your medical condition     (cold, fever, infections).    x___6. On the morning of surgery brush your teeth with toothpaste and water.  You may rinse your mouth with mouth wash if you wish.  Do not swallow any toothpaste or mouthwash.   Do not wear jewelry, make-up, hairpins, clips or nail polish.  Do not wear lotions, powders, or  perfumes. You may wear deodorant.  Do not shave 48 hours prior to surgery. Men may shave face and neck.  Do not bring valuables to the hospital.    [facility] belongings or valuables.               Contacts, dentures or bridgework may not be worn into surgery.  Leave your suitcase in the car. After surgery it may be brought to your room.  For patients admitted to the hospital, discharge time is determined by your treatment team.  _  Patients discharged the day of surgery will not be allowed to drive home.  You will need someone to drive you home and stay with you the night of your procedure.    Please read over the following fact sheets that you were given:   [facility]  ____ Take anti-hypertensive listed below, cardiac, seizure, asthma, anti-reflux and psychiatric medicines. These include:  1. NONE  2.  3.  4.  5.  6.  ____Fleets enema or Magnesium Citrate as directed.   _x___ Use CHG Soap or sage wipes as directed on instruction sheet   ____ Use inhalers on the day of surgery and bring to hospital day of surgery  ____ Stop Metformin and Janumet 2 days prior to surgery.    ____ Take 1/2 of usual insulin dose the night before surgery and none on  the morning surgery.   ____ Follow recommendations from Cardiologist, Pulmonologist or PCP regarding stopping Aspirin, Coumadin, Plavix ,Eliquis, Effient, or Pradaxa, and Pletal.  X____Stop Anti-inflammatories such as Advil, Aleve, Ibuprofen, Motrin, Naproxen, Naprosyn, Goodies powders or aspirin products NOW- OK to take Tylenol    ____ Stop supplements until after surgery.     ____ Bring C-Pap to the hospital.

## 2018-07-03 ENCOUNTER — Encounter
Admission: RE | Admit: 2018-07-03 | Discharge: 2018-07-03 | Disposition: A | Discharge status: Home / Self Care | Payer: BLUE CROSS/BLUE SHIELD | Source: Ambulatory Visit | Attending: Surgery | Admitting: Surgery

## 2018-07-03 DIAGNOSIS — L723 Sebaceous cyst: Secondary | ICD-10-CM

## 2018-07-03 DIAGNOSIS — Z01812 Encounter for preprocedural laboratory examination: Secondary | ICD-10-CM | POA: Diagnosis present

## 2018-07-03 LAB — BASIC METABOLIC PANEL
Anion gap: 8 (ref 5–15)
BUN: 8 mg/dL (ref 6–20)
CO2: 27 mmol/L (ref 22–32)
Calcium: 8.6 mg/dL — ABNORMAL LOW (ref 8.9–10.3)
Chloride: 103 mmol/L (ref 98–111)
Creatinine, Ser: 0.79 mg/dL (ref 0.61–1.24)
GFR calc Af Amer: >60 mL/min (ref >60)
GFR calc non Af Amer: >60 mL/min (ref >60)
Glucose, Bld: 120 mg/dL — ABNORMAL HIGH (ref 70–99)
POTASSIUM: 3.8 mmol/L (ref 3.5–5.1)
Sodium: 138 mmol/L (ref 135–145)

## 2018-07-03 LAB — CBC WITH DIFFERENTIAL/PLATELET
Abs Immature Granulocytes: 0.02 10*3/uL (ref 0.00–0.07)
Basophils Absolute: 0 10*3/uL (ref 0.0–0.1)
Basophils Relative: 0 %
Eosinophils Absolute: 0.2 10*3/uL (ref 0.0–0.5)
Eosinophils Relative: 3 %
HCT: 45.4 % (ref 39.0–52.0)
Hemoglobin: 13.5 g/dL (ref 13.0–17.0)
Immature Granulocytes: 0 %
Lymphocytes Relative: 25 %
Lymphs Abs: 1.6 10*3/uL (ref 0.7–4.0)
MCH: 29.8 pg (ref 26.0–34.0)
MCHC: 29.7 g/dL — ABNORMAL LOW (ref 30.0–36.0)
MCV: 100.2 fL — ABNORMAL HIGH (ref 80.0–100.0)
Monocytes Absolute: 0.4 10*3/uL (ref 0.1–1.0)
Monocytes Relative: 6 %
Neutro Abs: 4.3 10*3/uL (ref 1.7–7.7)
Neutrophils Relative %: 66 %
Platelets: 178 10*3/uL (ref 150–400)
RBC: 4.53 MIL/uL (ref 4.22–5.81)
RDW: 12.8 % (ref 11.5–15.5)
WBC: 6.5 10*3/uL (ref 4.0–10.5)
nRBC: 0 % (ref 0.0–0.2)

## 2018-07-03 MED ORDER — CHLORHEXIDINE GLUCONATE CLOTH 2 % EX PADS
6.0000 | MEDICATED_PAD | Freq: Once | CUTANEOUS | Status: DC
  Filled 2018-07-03: qty 6

## 2018-07-10 MED ORDER — DEXTROSE 5 % IV SOLN
3.0000 g | INTRAVENOUS | Status: AC
  Administered 2018-07-11: 3 g via INTRAVENOUS
  Filled 2018-07-10: qty 3000

## 2018-07-11 ENCOUNTER — Ambulatory Visit
Admission: RE | Admit: 2018-07-11 | Discharge: 2018-07-11 | Disposition: A | Discharge status: Home / Self Care | Payer: BLUE CROSS/BLUE SHIELD | Attending: Surgery | Admitting: Surgery

## 2018-07-11 ENCOUNTER — Ambulatory Visit: Payer: BLUE CROSS/BLUE SHIELD | Admitting: Certified Registered"

## 2018-07-11 ENCOUNTER — Encounter
Admission: RE | Disposition: A | Discharge status: Home / Self Care | Payer: Self-pay | Source: Home / Self Care | Attending: Surgery

## 2018-07-11 ENCOUNTER — Encounter: Payer: Self-pay | Admitting: Anesthesiology

## 2018-07-11 DIAGNOSIS — L723 Sebaceous cyst: Secondary | ICD-10-CM | POA: Insufficient documentation

## 2018-07-11 DIAGNOSIS — Z87891 Personal history of nicotine dependence: Secondary | ICD-10-CM | POA: Diagnosis not present

## 2018-07-11 DIAGNOSIS — Z6841 Body Mass Index (BMI) 40.0 and over, adult: Secondary | ICD-10-CM | POA: Insufficient documentation

## 2018-07-11 DIAGNOSIS — K219 Gastro-esophageal reflux disease without esophagitis: Secondary | ICD-10-CM | POA: Diagnosis not present

## 2018-07-11 DIAGNOSIS — I10 Essential (primary) hypertension: Secondary | ICD-10-CM | POA: Diagnosis not present

## 2018-07-11 DIAGNOSIS — L720 Epidermal cyst: Secondary | ICD-10-CM | POA: Diagnosis not present

## 2018-07-11 HISTORY — PX: IRRIGATION AND DEBRIDEMENT SEBACEOUS CYST: SHX5255

## 2018-07-11 SURGERY — IRRIGATION AND DEBRIDEMENT SEBACEOUS CYST
Anesthesia: General | Laterality: Right

## 2018-07-11 MED ORDER — ROCURONIUM BROMIDE 100 MG/10ML IV SOLN
INTRAVENOUS | Status: DC | PRN
  Administered 2018-07-11: 20 mg via INTRAVENOUS
  Administered 2018-07-11: 30 mg via INTRAVENOUS
  Administered 2018-07-11: 15 mg via INTRAVENOUS
  Administered 2018-07-11: 20 mg via INTRAVENOUS

## 2018-07-11 MED ORDER — DEXAMETHASONE SODIUM PHOSPHATE 10 MG/ML IJ SOLN
INTRAMUSCULAR | Status: DC | PRN
  Administered 2018-07-11: 5 mg via INTRAVENOUS

## 2018-07-11 MED ORDER — FAMOTIDINE 20 MG PO TABS
ORAL_TABLET | ORAL | Status: AC
  Filled 2018-07-11: qty 1

## 2018-07-11 MED ORDER — OXYCODONE HCL 5 MG PO TABS
5.0000 mg | ORAL_TABLET | ORAL | Status: DC | PRN
  Administered 2018-07-11: 5 mg via ORAL

## 2018-07-11 MED ORDER — MIDAZOLAM HCL 2 MG/2ML IJ SOLN
INTRAMUSCULAR | Status: AC
  Filled 2018-07-11: qty 2

## 2018-07-11 MED ORDER — LIDOCAINE HCL (PF) 1 % IJ SOLN
INTRAMUSCULAR | Status: DC | PRN
  Administered 2018-07-11: 10 mL

## 2018-07-11 MED ORDER — OXYCODONE-ACETAMINOPHEN 5-325 MG PO TABS: 1.0000 | ORAL_TABLET | ORAL | 0 refills | Status: DC | PRN

## 2018-07-11 MED ORDER — BACITRACIN 500 UNIT/GM EX OINT
TOPICAL_OINTMENT | CUTANEOUS | Status: DC | PRN
  Administered 2018-07-11: 1 via TOPICAL

## 2018-07-11 MED ORDER — FENTANYL CITRATE (PF) 100 MCG/2ML IJ SOLN
INTRAMUSCULAR | Status: DC | PRN
  Administered 2018-07-11 (×2): 50 ug via INTRAVENOUS
  Administered 2018-07-11: 100 ug via INTRAVENOUS

## 2018-07-11 MED ORDER — FENTANYL CITRATE (PF) 100 MCG/2ML IJ SOLN: 25.0000 ug | INTRAMUSCULAR | Status: DC | PRN

## 2018-07-11 MED ORDER — BACITRACIN ZINC 500 UNIT/GM EX OINT
TOPICAL_OINTMENT | CUTANEOUS | Status: AC
  Filled 2018-07-11: qty 28.35

## 2018-07-11 MED ORDER — FAMOTIDINE 20 MG PO TABS
20.0000 mg | ORAL_TABLET | Freq: Once | ORAL | Status: AC
  Administered 2018-07-11: 20 mg via ORAL

## 2018-07-11 MED ORDER — ACETAMINOPHEN 500 MG PO TABS
ORAL_TABLET | ORAL | Status: AC
  Filled 2018-07-11: qty 2

## 2018-07-11 MED ORDER — ONDANSETRON HCL 4 MG/2ML IJ SOLN
INTRAMUSCULAR | Status: AC
  Filled 2018-07-11: qty 2

## 2018-07-11 MED ORDER — SUGAMMADEX SODIUM 500 MG/5ML IV SOLN
INTRAVENOUS | Status: AC
  Filled 2018-07-11: qty 5

## 2018-07-11 MED ORDER — FENTANYL CITRATE (PF) 250 MCG/5ML IJ SOLN
INTRAMUSCULAR | Status: AC
  Filled 2018-07-11: qty 5

## 2018-07-11 MED ORDER — PROMETHAZINE HCL 25 MG/ML IJ SOLN: 6.2500 mg | INTRAMUSCULAR | Status: DC | PRN

## 2018-07-11 MED ORDER — BUPIVACAINE HCL (PF) 0.5 % IJ SOLN
INTRAMUSCULAR | Status: DC | PRN
  Administered 2018-07-11: 10 mL

## 2018-07-11 MED ORDER — SUGAMMADEX SODIUM 500 MG/5ML IV SOLN
INTRAVENOUS | Status: DC | PRN
  Administered 2018-07-11: 350 mg via INTRAVENOUS

## 2018-07-11 MED ORDER — IPRATROPIUM-ALBUTEROL 0.5-2.5 (3) MG/3ML IN SOLN
RESPIRATORY_TRACT | Status: AC
  Filled 2018-07-11: qty 3

## 2018-07-11 MED ORDER — PROPOFOL 10 MG/ML IV BOLUS
INTRAVENOUS | Status: DC | PRN
  Administered 2018-07-11: 200 mg via INTRAVENOUS
  Administered 2018-07-11: 80 mg via INTRAVENOUS

## 2018-07-11 MED ORDER — MIDAZOLAM HCL 2 MG/2ML IJ SOLN
INTRAMUSCULAR | Status: DC | PRN
  Administered 2018-07-11: 2 mg via INTRAVENOUS

## 2018-07-11 MED ORDER — LACTATED RINGERS IV SOLN
INTRAVENOUS | Status: DC
  Administered 2018-07-11: 10:00:00 via INTRAVENOUS

## 2018-07-11 MED ORDER — LIDOCAINE HCL (PF) 1 % IJ SOLN
INTRAMUSCULAR | Status: AC
  Filled 2018-07-11: qty 30

## 2018-07-11 MED ORDER — PROPOFOL 10 MG/ML IV BOLUS
INTRAVENOUS | Status: AC
  Filled 2018-07-11: qty 20

## 2018-07-11 MED ORDER — SUCCINYLCHOLINE CHLORIDE 20 MG/ML IJ SOLN
INTRAMUSCULAR | Status: AC
  Filled 2018-07-11: qty 1

## 2018-07-11 MED ORDER — ACETAMINOPHEN 500 MG PO TABS
1000.0000 mg | ORAL_TABLET | ORAL | Status: AC
  Administered 2018-07-11: 1000 mg via ORAL

## 2018-07-11 MED ORDER — LIDOCAINE HCL (PF) 2 % IJ SOLN
INTRAMUSCULAR | Status: AC
  Filled 2018-07-11: qty 10

## 2018-07-11 MED ORDER — OXYCODONE HCL 5 MG PO TABS
ORAL_TABLET | ORAL | Status: AC
  Filled 2018-07-11: qty 1

## 2018-07-11 MED ORDER — LIDOCAINE HCL (CARDIAC) PF 100 MG/5ML IV SOSY
PREFILLED_SYRINGE | INTRAVENOUS | Status: DC | PRN
  Administered 2018-07-11: 100 mg via INTRAVENOUS

## 2018-07-11 MED ORDER — IPRATROPIUM-ALBUTEROL 0.5-2.5 (3) MG/3ML IN SOLN
3.0000 mL | RESPIRATORY_TRACT | Status: DC
  Administered 2018-07-11: 3 mL via RESPIRATORY_TRACT

## 2018-07-11 MED ORDER — DEXAMETHASONE SODIUM PHOSPHATE 10 MG/ML IJ SOLN
INTRAMUSCULAR | Status: AC
  Filled 2018-07-11: qty 1

## 2018-07-11 MED ORDER — BUPIVACAINE HCL (PF) 0.5 % IJ SOLN
INTRAMUSCULAR | Status: AC
  Filled 2018-07-11: qty 30

## 2018-07-11 MED ORDER — ONDANSETRON HCL 4 MG/2ML IJ SOLN
INTRAMUSCULAR | Status: DC | PRN
  Administered 2018-07-11: 4 mg via INTRAVENOUS

## 2018-07-11 MED ORDER — SUCCINYLCHOLINE CHLORIDE 20 MG/ML IJ SOLN
INTRAMUSCULAR | Status: DC | PRN
  Administered 2018-07-11: 160 mg via INTRAVENOUS

## 2018-07-11 SURGICAL SUPPLY — 34 items
BLADE SURG 15 STRL LF DISP TIS (BLADE) ×1 IMPLANT
BLADE SURG 15 STRL SS (BLADE) ×2
CHLORAPREP W/TINT 26ML (MISCELLANEOUS) ×3 IMPLANT
COVER WAND RF STERILE (DRAPES) ×3 IMPLANT
DERMABOND ADVANCED (GAUZE/BANDAGES/DRESSINGS) ×2
DERMABOND ADVANCED .7 DNX12 (GAUZE/BANDAGES/DRESSINGS) ×1 IMPLANT
DRAIN PENROSE 1/4X12 LTX (DRAIN) ×3 IMPLANT
DRAPE LAPAROTOMY 100X77 ABD (DRAPES) ×3 IMPLANT
DRSG TEGADERM 4X4.75 (GAUZE/BANDAGES/DRESSINGS) ×3 IMPLANT
ELECT CAUTERY BLADE 6.4 (BLADE) ×3 IMPLANT
ELECT REM PT RETURN 9FT ADLT (ELECTROSURGICAL) ×3
ELECTRODE REM PT RTRN 9FT ADLT (ELECTROSURGICAL) ×1 IMPLANT
GAUZE 4X4 16PLY RFD (DISPOSABLE) ×3 IMPLANT
GLOVE BIO SURGEON STRL SZ7 (GLOVE) ×3 IMPLANT
GLOVE INDICATOR 7.5 STRL GRN (GLOVE) ×3 IMPLANT
GOWN STRL REUS W/ TWL LRG LVL3 (GOWN DISPOSABLE) ×2 IMPLANT
GOWN STRL REUS W/TWL LRG LVL3 (GOWN DISPOSABLE) ×4
KIT TURNOVER KIT A (KITS) ×3 IMPLANT
LABEL OR SOLS (LABEL) ×3 IMPLANT
NEEDLE HYPO 25X1 1.5 SAFETY (NEEDLE) ×3 IMPLANT
NS IRRIG 500ML POUR BTL (IV SOLUTION) ×3 IMPLANT
PACK BASIN MINOR ARMC (MISCELLANEOUS) ×3 IMPLANT
SPONGE GAUZE 2X2 8PLY STER LF (GAUZE/BANDAGES/DRESSINGS) ×1
SPONGE GAUZE 2X2 8PLY STRL LF (GAUZE/BANDAGES/DRESSINGS) ×2 IMPLANT
SPONGE LAP 18X18 RF (DISPOSABLE) ×6 IMPLANT
SUT ETHILON 3-0 FS-10 30 BLK (SUTURE)
SUT MNCRL 4-0 (SUTURE) ×2
SUT MNCRL 4-0 27XMFL (SUTURE) ×1
SUT VIC AB 3-0 SH 27 (SUTURE) ×2
SUT VIC AB 3-0 SH 27X BRD (SUTURE) ×1 IMPLANT
SUTURE EHLN 3-0 FS-10 30 BLK (SUTURE) IMPLANT
SUTURE MNCRL 4-0 27XMF (SUTURE) ×1 IMPLANT
SYR 10ML LL (SYRINGE) ×3 IMPLANT
SYR BULB 3OZ (MISCELLANEOUS) ×3 IMPLANT

## 2018-07-11 NOTE — H&P (Signed)
SURGICAL PRE-OP HISTORY & PHYSICAL  HPI:  37 y.o. Male recently presented to clinic for follow-up evaluation 1 month s/p inpatient bedside incision and drainage of large and painful Left of mid-upper back abscess attributed to infected sebaceous cyst(Edee Nifong,04/27/2018)with a second symptomatic non-infected Right posterior shoulder sebaceous cyst that patient also requests be removed. Patient reported at that time complete resolution of his pre-incisional upper back pain, denies any fever/chills, N/V, diarrhea, CP, or SOB. His wife stated that she was packing his wound as instructed until recently there has been not remaining cavity left to pack, for which she's been instead applying a clean dry gauze over the wound without further drainage.  Patient and his wife report no changes or new symptoms since previously evaluated in-office except continued healing of previously drained abscess wound.  Review of Systems:  Constitutional: denies any other weight loss, fever, chills, or sweats  Eyes: denies any other vision changes, history of eye injury  ENT: denies sore throat, hearing problems  Respiratory: denies shortness of breath, wheezing  Cardiovascular: denies chest pain, palpitations  Gastrointestinal: denies abdominal pain, N/V, or diarrhea Musculoskeletal: denies any other joint pains or cramps  Skin: Denies any other rashes or skin discolorations except as per interval history Neurological: denies any other headache, dizziness, weakness  Psychiatric: denies any other depression, anxiety  All other review of systems: otherwise negative   Vital Signs:  BP (!) 165/94   Pulse 94   Temp 98.9 F (37.2 C) (Tympanic)   Resp 20   Ht 5\' 7"  (1.702 m)   Wt (!) 172.8 kg   SpO2 99%   BMI 59.67 kg/m   Physical Exam:  Constitutional:  -- Obese body habitus  -- Awake, alert, and oriented x3  Eyes:  -- Pupils equally round and reactive to light  -- No scleral icterus  Ear, nose, throat:   -- No jugular venous distension  -- No nasal drainage, bleeding Pulmonary:  -- No crackles -- Equal breath sounds bilaterally -- Breathing non-labored at rest Cardiovascular:  -- S1, S2 present  -- No pericardial rubs  Gastrointestinal:  -- Soft, nontender, non-distended, no guarding/rebound  -- No abdominal masses appreciated, pulsatile or otherwise  Musculoskeletal / Integumentary:  -- Wounds or skin discoloration: punctate 1 mm focus of skin retraction at site of prior incision and drainage for large mid-back abscess, non-tender to palpation without any surrounding erythema or appreciable drainage, mildly tender to palpation 2 cm firm Left posterior shoulder subcutaneous mass with punctate hyperpigmented sinus without surrounding erythema or drainage -- Extremities: B/L UE and LE FROM, hands and feet warm Neurologic:  -- Motor function: intact and symmetric  -- Sensation: intact and symmetric   Laboratory studies:  CBC Latest Ref Rng & Units 04/29/2018 04/27/2018 04/26/2018  WBC 3.8 - 10.6 K/uL 11.7(H) 13.2(H) 13.5(H)  Hemoglobin 13.0 - 18.0 g/dL 12.7(L) 12.5(L) 13.1  Hematocrit 40.0 - 52.0 % 40.5 37.8(L) 40.1  Platelets 150 - 440 K/uL 203 169 182   CMP Latest Ref Rng & Units 04/29/2018 04/27/2018 04/26/2018  Glucose 70 - 99 mg/dL 409(W131(H) 119(J164(H) 478(G181(H)  BUN 6 - 20 mg/dL 9 6 7   Creatinine 0.61 - 1.24 mg/dL 9.560.89 2.131.09 0.861.21  Sodium 135 - 145 mmol/L 136 140 138  Potassium 3.5 - 5.1 mmol/L 3.4(L) 3.7 3.3(L)  Chloride 98 - 111 mmol/L 92(L) 99 97(L)  CO2 22 - 32 mmol/L 33(H) 31 32  Calcium 8.9 - 10.3 mg/dL 5.7(Q8.6(L) 4.6(N8.2(L) 6.2(X8.7(L)  Total Protein 6.0 - 8.3 g/dL - - -  Total Bilirubin 0.3 - 1.2 mg/dL - - -  Alkaline Phos 39 - 117 U/L - - -  AST 0 - 37 U/L - - -  ALT 0 - 53 U/L - - -   Imaging: No new pertinent imaging studies available for review   Assessment:  37 y.o. yo Male with a problem list including...      Patient Active Problem List   Diagnosis Date Noted  . Abscess of  upper back excluding scapular region   . Sebaceous cyst   . Sepsis (HCC) 04/27/2018  . HTN (hypertension) 04/27/2018    recently presented to clinic for follow-up evaluation, doing well s/p inpatient bedside incision and drainage of large and painful Left of mid-upper back abscess attributed to infected sebaceous cyst(Trena Dunavan,04/27/2018)with a second symptomatic non-infected Right posterior shoulder sebaceous cyst that patient also requests be removed.  Plan:              - NPO, IV fluids             - all risks, benefits, and alternatives to excision of Left posterior shoulder sebaceous cyst and large mid-upper back previously infected sebaceous cyst were discussed with the patient and his wife family, all of their questions were answered to their expressed satisfaction, patient expresses he wishes to proceed, and informed consent was obtained.             - will plan for excision of Left posterior shoulder sebaceous cyst and large previously infected mid-upper back sebaceous cyst in OR today due to back cyst too large at time of drainage for in-office excision             - anticipate return to clinic 2 weeks following above procedure  All of the above recommendations were discussed with the patient and patient's wife, and all of patient's and family's questions were answered to their expressed satisfaction.  -- Scherrie Gerlach Earlene Plater, MD, RPVI [place]: [facility] - Partnering for exceptional care. Office: [phone]

## 2018-07-11 NOTE — Transfer of Care (Signed)
Immediate Anesthesia Transfer of Care Note  Patient: Clifford Nichols  Procedure(s) Performed: EXCISION  SEBACEOUS CYST POSTERIOR SHOULDER AND MID-BACK (Right )  Patient Location: PACU  Anesthesia Type:General  Level of Consciousness: drowsy  Airway & Oxygen Therapy: Patient Spontanous Breathing and Patient connected to face mask oxygen  Post-op Assessment: Report given to RN and Post -op Vital signs reviewed and stable  Post vital signs: stable  Last Vitals:  Vitals Value Taken Time  BP 105/96 07/11/2018  2:12 PM  Temp    Pulse 116 07/11/2018  2:14 PM  Resp 21 07/11/2018  2:13 PM  SpO2 94 % 07/11/2018  2:14 PM  Vitals shown include unvalidated device data.  Last Pain:  Vitals:   07/11/18 0941  TempSrc: Tympanic  PainSc: 0-No pain         Complications: No apparent anesthesia complications

## 2018-07-11 NOTE — Anesthesia Post-op Follow-up Note (Signed)
Anesthesia QCDR form completed.        

## 2018-07-11 NOTE — OR Nursing (Signed)
Discussed drsg change with Dr. Earlene Plateravis via tele.  Questioned penrose drain after drsgs removed as instructed with discharge teaching.  He advises pt may put gauze/paper tape over incisions/drain if drainage appears after bandages removed, also to protect his clothing.  Spouse advises "his eyes and face is swollen".  Discussed with Dr. Earlene Plateravis, advises could be due to positioning during surgery.  Dr. Pernell DupreAdams in to speak with patient and spouse regarding same.

## 2018-07-11 NOTE — Interval H&P Note (Signed)
History and Physical Interval Note:  07/11/2018 10:32 AM  Clifford Nichols  has presented today for surgery, with the diagnosis of BACK SEBACEOUS CYST x2  The various methods of treatment have been discussed with the patient and family. After consideration of risks, benefits and other options for treatment, the patient has consented to  Procedure(s): EXCISION  SEBACEOUS CYST POSTERIOR SHOULDER AND MID-BACK (Left) as a surgical intervention .  The patient's history has been reviewed, patient examined, no change in status, stable for surgery.  I have reviewed the patient's chart and labs.  Questions were answered to the patient's satisfaction.     Ancil LinseyJason Evan Shannell Mikkelsen

## 2018-07-11 NOTE — Op Note (Addendum)
SURGICAL OPERATIVE REPORT  DATE OF PROCEDURE: 07/11/2018  ATTENDING Surgeon(s): Ancil LinseyJason Evan Luane Rochon, MD  ANESTHESIA: Local  PRE-OPERATIVE DIAGNOSIS: Increasingly symptomatic (painful) non-infected Right posterior shoulder sebaceous cyst and large mid-upper back sebaceous cyst s/p recent incision and drainage (icd-10: L72.3)   POST-OPERATIVE DIAGNOSIS: Increasingly symptomatic (painful) non-infected Right posterior shoulder sebaceous cyst and large mid-upper back sebaceous cyst s/p recent incision and drainage (icd-10: L72.3)   PROCEDURE(S):  1.) Excision of increasingly symptomatic (painful) non-infected Right posterior shoulder sebaceous cyst and large mid-upper back sebaceous cyst s/p recent incision and drainage (cpt: 21933 x2)   INTRAOPERATIVE FINDINGS: 6 cm x 4.5 cm x 3 cm non-infected subfascial subcutaneous Right posterior shoulder mass (most likely a sebaceous cyst), removed intact, and a second 8 cm x 4.5 cm x 4 cm subfascial mid-upper back subfascial mass (most likely a large sebaceous cyst) s/p recent incision and drainage, removed overall intact with one additional fragment of possible cyst wall deep to the excised cyst  INTRAVENOUS FLUIDS: 800 mL crystalloid   ESTIMATED BLOOD LOSS: 200 mL  URINE OUTPUT: No Foley  SPECIMENS: 6 cm x 4.5 cm x 3 cm non-infected subfascial subcutaneous Right posterior shoulder mass (most likely a sebaceous cyst), removed intact, and a second 8 cm x 4.5 cm x 4 cm subfascial mid-upper back subfascial mass (most likely a large sebaceous cyst) s/p recent incision and drainage, removed overall intact with one additional fragment of possible cyst wall deep to the excised cyst  IMPLANTS: None  DRAINS: None  COMPLICATIONS: None apparent  CONDITION AT END OF PROCEDURE: Hemodynamically stable and extubated  DISPOSITION OF PATIENT: PACU  INDICATIONS FOR PROCEDURE:  Patient is a 37 y.o. male who recently underwent incision and drainage for a large  upper mid-back abscess attributed to an infected sebaceous cyst. Patient also reported an increasingly symptomatic Right posterior shoulder sebaceous cyst. Upon healing of his upper mid-back wound, patient requested that both lesions be removed. All risks, benefits, and alternatives to above procedures were discussed with the patient, all of patient's questions were answered to his expressed satisfaction, and informed consent was accordingly obtained and documented at that time.   DETAILS OF PROCEDURE: Patient was brought to the operating suite and was appropriately identified. General anesthesia was administered along with appropriate pre-operative antibiotics, and endotracheal intubation was performed by anesthetist(s). Laying on the OR table with his Right side up, patient was well-secured and all pressure points appropriately padded. Operative site was prepped and draped in the usual sterile fashion, and following a brief time out, a 4 cm long transverse elliptical incision was made using a #15 blade scalpel, and incision was extended deep around subfascial Right posterior shoulder mass to encompass and excise the mass using sharp + blunt dissection and electrocautery. During the course of dissecting free the cyst, it was not disrupted and was removed intact. Direct pressure was applied for hemostasis. The same was then repeated for the large mid-upper back subfascial mass, after which an additional piece of what appeared to be the mass/cyst was excised. Further examination did not reveal any additional mass/cyst. Hemostasis was achieved, and the wound was copiously irrigated with sterile warm saline/unused local anesthetic. Considering the particularly large mid-upper back wound cavity, a 1/4" Penrose drain was inserted to promote drainage and secured using a 3-0 Nylon suture. Deep tissue and dermis were re-approximated using buried interrupted 3-0 Vicryl suture, and vertical mattress 3-0 Nylon suture was  used to re-approximate epidermis. Skin was then cleaned and dried, and sterile  bacitracin was applied to the wound/sutures to reduce the dressing from sticking to the sutures, and 4 x 4" gauze and adhesive dressings were applied. Patient was then safely awaked, extubated, and transferred to recovery for post-procedural monitoring and care.  I was present for all aspects of the above procedure, and no operative complications were apparent.

## 2018-07-11 NOTE — Anesthesia Preprocedure Evaluation (Signed)
Anesthesia Evaluation  Patient identified by MRN, date of birth, ID band Patient awake    Reviewed: Allergy & Precautions, H&P , NPO status , Patient's Chart, lab work & pertinent test results, reviewed documented beta blocker date and time   History of Anesthesia Complications Negative for: history of anesthetic complications  Airway Mallampati: III  TM Distance: >3 FB Neck ROM: full    Dental  (+) Dental Advidsory Given, Teeth Intact   Pulmonary neg pulmonary ROS, former smoker,    breath sounds clear to auscultation + decreased breath sounds      Cardiovascular Exercise Tolerance: Good hypertension, (-) angina(-) Past MI (-) dysrhythmias (-) Valvular Problems/Murmurs     Neuro/Psych negative neurological ROS  negative psych ROS   GI/Hepatic Neg liver ROS, GERD  ,  Endo/Other  neg diabetesMorbid obesity  Renal/GU negative Renal ROS  negative genitourinary   Musculoskeletal   Abdominal   Peds  Hematology negative hematology ROS (+)   Anesthesia Other Findings Past Medical History: No date: GERD (gastroesophageal reflux disease)     Comment:  OCC-NO MEDS No date: Hypertension   Reproductive/Obstetrics negative OB ROS                             Anesthesia Physical Anesthesia Plan  ASA: III  Anesthesia Plan: General   Post-op Pain Management:    Induction: Intravenous and Rapid sequence  PONV Risk Score and Plan: 2 and Ondansetron, Dexamethasone, Midazolam, Promethazine and Treatment may vary due to age or medical condition  Airway Management Planned: Oral ETT  Additional Equipment:   Intra-op Plan:   Post-operative Plan:   Informed Consent: I have reviewed the patients History and Physical, chart, labs and discussed the procedure including the risks, benefits and alternatives for the proposed anesthesia with the patient or authorized representative who has indicated his/her  understanding and acceptance.   Dental Advisory Given  Plan Discussed with: Anesthesiologist, CRNA and Surgeon  Anesthesia Plan Comments:         Anesthesia Quick Evaluation

## 2018-07-11 NOTE — Discharge Instructions (Addendum)
In addition to included general post-operative instructions for Excision of Back Cysts,  Diet: Resume home heart healthy diet.   Activity: No heavy lifting >15 - 20 pounds (children, pets, laundry, garbage) or strenuous activity until follow-up, but light activity and walking are encouraged. Do not drive or drink alcohol if taking narcotic pain medications.  Wound care: Remove dressing in 2 days unless otherwise instructed. Once dressing removed, 2 days after surgery (Friday, 12/20), you may shower/get incision wet with soapy water and pat dry (do not rub incisions), but no baths or submerging incision underwater until follow-up.   Medications: Resume all home medications. For mild to moderate pain: acetaminophen (Tylenol) or ibuprofen/naproxen (if no kidney disease). Combining Tylenol with alcohol can substantially increase your risk of causing liver disease. Narcotic pain medications, if prescribed, can be used for severe pain, though may cause nausea, constipation, and drowsiness. Do not combine Tylenol and Percocet (or similar) within a 6 hour period as Percocet (and similar) contain(s) Tylenol. If you do not need the narcotic pain medication, you do not need to fill the prescription.  Call office [phone]) at any time if any questions, worsening pain, fevers/chills, bleeding, drainage from incision site, or other concerns.  AMBULATORY SURGERY  DISCHARGE INSTRUCTIONS   1) The drugs that you were given will stay in your system until tomorrow so for the next 24 hours you should not:  A) Drive an automobile B) Make any legal decisions C) Drink any alcoholic beverage   2) You may resume regular meals tomorrow.  Today it is better to start with liquids and gradually work up to solid foods.  You may eat anything you prefer, but it is better to start with liquids, then soup and crackers, and gradually work up to solid foods.   3) Please notify your doctor immediately if you have any  unusual bleeding, trouble breathing, redness and pain at the surgery site, drainage, fever, or pain not relieved by medication.    4) Additional Instructions:        Please contact your physician with any problems or Same Day Surgery at [phone], Monday through Friday 6 am to 4 pm, or [place] at [facility] Main number at [phone].

## 2018-07-11 NOTE — Anesthesia Procedure Notes (Signed)
Procedure Name: Intubation Date/Time: 07/11/2018 11:20 AM Performed by: Berniece PapIroha, Vana Arif Ben, CRNA Pre-anesthesia Checklist: Patient identified, Emergency Drugs available, Suction available, Patient being monitored and Timeout performed Patient Re-evaluated:Patient Re-evaluated prior to induction Oxygen Delivery Method: Circle system utilized Preoxygenation: Pre-oxygenation with 100% oxygen Induction Type: IV induction Ventilation: Oral airway inserted - appropriate to patient size Laryngoscope Size: Glidescope Grade View: Grade I Tube size: 7.5 mm Number of attempts: 1 Airway Equipment and Method: Stylet Placement Confirmation: positive ETCO2,  CO2 detector,  breath sounds checked- equal and bilateral and ETT inserted through vocal cords under direct vision Secured at: 23 cm Tube secured with: Tape

## 2018-07-11 NOTE — Anesthesia Postprocedure Evaluation (Signed)
Anesthesia Post Note  Patient: Clifford BottcherJermaine L Coonradt  Procedure(s) Performed: EXCISION  SEBACEOUS CYST POSTERIOR SHOULDER AND MID-BACK (Right )  Patient location during evaluation: PACU Anesthesia Type: General Level of consciousness: awake and alert Pain management: pain level controlled Vital Signs Assessment: post-procedure vital signs reviewed and stable Respiratory status: spontaneous breathing, nonlabored ventilation, respiratory function stable and patient connected to nasal cannula oxygen Cardiovascular status: blood pressure returned to baseline and stable Postop Assessment: no apparent nausea or vomiting Anesthetic complications: no     Last Vitals:  Vitals:   07/11/18 1416 07/11/18 1427  BP: (!) 105/96 132/73  Pulse: (!) 102 (!) 118  Resp: 20 19  Temp: (!) 36.1 C   SpO2: 99% 95%    Last Pain:  Vitals:   07/11/18 1427  TempSrc:   PainSc: 0-No pain                 Cleda MccreedyJoseph K Piscitello

## 2018-07-11 NOTE — OR Nursing (Signed)
Received incentive spirometry in pacu, demonstrates good technique with same.

## 2018-07-11 NOTE — Anesthesia Procedure Notes (Signed)
Performed by: Aki Burdin Ben, CRNA       

## 2018-07-12 ENCOUNTER — Encounter: Payer: Self-pay | Admitting: Surgery

## 2018-07-12 NOTE — Addendum Note (Signed)
Addendum  created 07/12/18 [id number] by Piscitello, Cleda MccreedyJoseph K, MD   Intraprocedure Event edited

## 2018-07-13 LAB — SURGICAL PATHOLOGY

## 2018-07-26 ENCOUNTER — Other Ambulatory Visit: Payer: Self-pay

## 2018-07-26 ENCOUNTER — Encounter: Payer: Self-pay | Admitting: Surgery

## 2018-07-26 ENCOUNTER — Ambulatory Visit (INDEPENDENT_AMBULATORY_CARE_PROVIDER_SITE_OTHER): Payer: BLUE CROSS/BLUE SHIELD | Admitting: Surgery

## 2018-07-26 VITALS — BP 197/78 | HR 88 | Temp 99.0°F | Resp 24 | Ht 67.0 in | Wt 394.2 lb

## 2018-07-26 DIAGNOSIS — Z4889 Encounter for other specified surgical aftercare: Secondary | ICD-10-CM

## 2018-07-26 DIAGNOSIS — L723 Sebaceous cyst: Secondary | ICD-10-CM

## 2018-07-26 NOTE — Progress Notes (Signed)
Surgical Clinic Progress/Follow-up Note   HPI:  38 y.o. Male presents to clinic for post-op follow-up 15 Days s/p excision of increasingly symptomatic (painful) non-infected Right posterior shoulder sebaceous cyst and large mid-upper back sebaceous cyst Earlene Plater, 07/11/2018) following recent incision and drainage of mid-upper back abscess (04/27/2018) associated with infection of his sebaceous cyst. Patient reports complete resolution of pre-operative painful pressure and denies N/V, fever/chills, CP, or SOB.  Review of Systems:  Constitutional: denies fever/chills  Respiratory: denies shortness of breath, wheezing  Cardiovascular: denies chest pain, palpitations  Skin: Denies any other rashes or skin discolorations except post-surgical wounds as per interval history  Vital Signs:  BP (!) 197/78   Pulse 88   Temp 99 F (37.2 C) (Temporal)   Resp (!) 24   Ht 5\' 7"  (1.702 m)   Wt (!) 394 lb 3.2 oz (178.8 kg)   SpO2 96%   BMI 61.74 kg/m    Physical Exam:  Constitutional:  -- Obese body habitus  -- Awake, alert, and oriented x3  Pulmonary:  -- No crackles -- Equal breath sounds bilaterally -- Breathing non-labored at rest Cardiovascular:  -- S1, S2 present  -- No pericardial rubs  Musculoskeletal / Integumentary:  -- Wounds or skin discoloration: Post-surgical Left posterior shoulder and mid-upper back wounds well-approximated with nylon sutures remaining in place and no surrounding erythema despite mid-upper back 1/4" Penrose drain remaining in place with a small amount of clear yellow non-purulent fluid released with removal of the drain, relatively large subcutaneous cavity -- Extremities: B/L UE and LE FROM, hands and feet warm, no edema   Imaging: No new pertinent imaging available for review  Assessment:  38 y.o. yo Male with a problem list including...  Patient Active Problem List   Diagnosis Date Noted  . Abscess of upper back excluding scapular region   . Sebaceous  cyst   . HTN (hypertension) 04/27/2018    presents to clinic for post-op follow-up evaluation, doing overall well 15 Days s/p excision of increasingly symptomatic (painful) non-infected Right posterior shoulder sebaceous cyst and large mid-upper back sebaceous cyst Earlene Plater, 07/11/2018) following recent incision and drainage of mid-upper back abscess (04/27/2018) associated with infection of his sebaceous cyst.  Plan:              - drain and sutures removed, mid-upper back wound packed             - change dry gauze packing once daily as previously by patient's wife             - gradually resume all activities without restrictions over next 2 weeks             - apply sunblock particularly to incisions with sun exposure to reduce pigmentation of scars             - return to clinic in 4 - 6 weeks, instructed to call office if any questions or concerns  - will plan to provide bariatric weight loss program info at next appointment  All of the above recommendations were discussed with the patient, and all of patient's questions were answered to his expressed satisfaction.  -- Scherrie Gerlach Earlene Plater, MD, RPVI [place]: [facility] - Partnering for exceptional care. Office: [phone]

## 2018-07-26 NOTE — Patient Instructions (Signed)
Patient is to return to the office in 4 to 6 weeks for wound check, please continue to pack the area keep it clean and dry.  Call the office with any questions or concerns.

## 2018-07-27 ENCOUNTER — Encounter: Payer: Self-pay | Admitting: Surgery

## 2018-08-23 ENCOUNTER — Ambulatory Visit: Payer: BLUE CROSS/BLUE SHIELD | Admitting: Surgery

## 2019-05-03 ENCOUNTER — Other Ambulatory Visit: Payer: Self-pay

## 2019-05-03 DIAGNOSIS — Z20822 Contact with and (suspected) exposure to covid-19: Secondary | ICD-10-CM

## 2019-05-04 LAB — NOVEL CORONAVIRUS, NAA: SARS-CoV-2, NAA: NOT DETECTED

## 2019-11-23 DIAGNOSIS — J80 Acute respiratory distress syndrome: Secondary | ICD-10-CM

## 2019-11-23 DIAGNOSIS — U071 COVID-19: Secondary | ICD-10-CM

## 2019-11-23 HISTORY — DX: COVID-19: U07.1

## 2019-11-23 HISTORY — DX: Acute respiratory distress syndrome: J80

## 2019-12-02 ENCOUNTER — Inpatient Hospital Stay
Admission: EM | Admit: 2019-12-02 | Discharge: 2019-12-04 | DRG: 871 | Disposition: A | Discharge status: Other Acute Inpatient Hospital | Payer: BLUE CROSS/BLUE SHIELD | Attending: Internal Medicine | Admitting: Internal Medicine

## 2019-12-02 ENCOUNTER — Other Ambulatory Visit: Payer: Self-pay

## 2019-12-02 ENCOUNTER — Encounter: Payer: Self-pay | Admitting: Emergency Medicine

## 2019-12-02 ENCOUNTER — Emergency Department: Payer: BLUE CROSS/BLUE SHIELD

## 2019-12-02 DIAGNOSIS — H0520 Unspecified exophthalmos: Secondary | ICD-10-CM | POA: Diagnosis present

## 2019-12-02 DIAGNOSIS — J80 Acute respiratory distress syndrome: Secondary | ICD-10-CM | POA: Diagnosis present

## 2019-12-02 DIAGNOSIS — J1282 Pneumonia due to coronavirus disease 2019: Secondary | ICD-10-CM | POA: Diagnosis present

## 2019-12-02 DIAGNOSIS — N179 Acute kidney failure, unspecified: Secondary | ICD-10-CM | POA: Diagnosis not present

## 2019-12-02 DIAGNOSIS — K219 Gastro-esophageal reflux disease without esophagitis: Secondary | ICD-10-CM | POA: Diagnosis present

## 2019-12-02 DIAGNOSIS — D696 Thrombocytopenia, unspecified: Secondary | ICD-10-CM | POA: Diagnosis present

## 2019-12-02 DIAGNOSIS — J9622 Acute and chronic respiratory failure with hypercapnia: Secondary | ICD-10-CM | POA: Diagnosis not present

## 2019-12-02 DIAGNOSIS — E119 Type 2 diabetes mellitus without complications: Secondary | ICD-10-CM

## 2019-12-02 DIAGNOSIS — J9600 Acute respiratory failure, unspecified whether with hypoxia or hypercapnia: Secondary | ICD-10-CM | POA: Diagnosis present

## 2019-12-02 DIAGNOSIS — E0800 Diabetes mellitus due to underlying condition with hyperosmolarity without nonketotic hyperglycemic-hyperosmolar coma (NKHHC): Secondary | ICD-10-CM | POA: Diagnosis not present

## 2019-12-02 DIAGNOSIS — J9601 Acute respiratory failure with hypoxia: Secondary | ICD-10-CM | POA: Diagnosis not present

## 2019-12-02 DIAGNOSIS — A4189 Other specified sepsis: Secondary | ICD-10-CM | POA: Diagnosis present

## 2019-12-02 DIAGNOSIS — I10 Essential (primary) hypertension: Secondary | ICD-10-CM | POA: Diagnosis present

## 2019-12-02 DIAGNOSIS — R6521 Severe sepsis with septic shock: Secondary | ICD-10-CM | POA: Diagnosis not present

## 2019-12-02 DIAGNOSIS — Z794 Long term (current) use of insulin: Secondary | ICD-10-CM | POA: Diagnosis not present

## 2019-12-02 DIAGNOSIS — R0902 Hypoxemia: Secondary | ICD-10-CM

## 2019-12-02 DIAGNOSIS — G9341 Metabolic encephalopathy: Secondary | ICD-10-CM | POA: Diagnosis not present

## 2019-12-02 DIAGNOSIS — R739 Hyperglycemia, unspecified: Secondary | ICD-10-CM | POA: Diagnosis present

## 2019-12-02 DIAGNOSIS — Z8249 Family history of ischemic heart disease and other diseases of the circulatory system: Secondary | ICD-10-CM | POA: Diagnosis not present

## 2019-12-02 DIAGNOSIS — Z87891 Personal history of nicotine dependence: Secondary | ICD-10-CM | POA: Diagnosis not present

## 2019-12-02 DIAGNOSIS — Z6841 Body Mass Index (BMI) 40.0 and over, adult: Secondary | ICD-10-CM

## 2019-12-02 DIAGNOSIS — E1165 Type 2 diabetes mellitus with hyperglycemia: Secondary | ICD-10-CM | POA: Diagnosis present

## 2019-12-02 DIAGNOSIS — Z930 Tracheostomy status: Secondary | ICD-10-CM | POA: Diagnosis not present

## 2019-12-02 DIAGNOSIS — U071 COVID-19: Secondary | ICD-10-CM | POA: Diagnosis present

## 2019-12-02 DIAGNOSIS — Z4659 Encounter for fitting and adjustment of other gastrointestinal appliance and device: Secondary | ICD-10-CM

## 2019-12-02 DIAGNOSIS — J9621 Acute and chronic respiratory failure with hypoxia: Secondary | ICD-10-CM | POA: Diagnosis not present

## 2019-12-02 HISTORY — DX: Type 2 diabetes mellitus without complications: E11.9

## 2019-12-02 LAB — BASIC METABOLIC PANEL
Anion gap: 12 (ref 5–15)
BUN: 10 mg/dL (ref 6–20)
CO2: 27 mmol/L (ref 22–32)
Calcium: 8.3 mg/dL — ABNORMAL LOW (ref 8.9–10.3)
Chloride: 97 mmol/L — ABNORMAL LOW (ref 98–111)
Creatinine, Ser: 1.13 mg/dL (ref 0.61–1.24)
GFR calc Af Amer: >60 mL/min (ref >60)
GFR calc non Af Amer: >60 mL/min (ref >60)
Glucose, Bld: 154 mg/dL — ABNORMAL HIGH (ref 70–99)
Potassium: 3.8 mmol/L (ref 3.5–5.1)
Sodium: 136 mmol/L (ref 135–145)

## 2019-12-02 LAB — TROPONIN I (HIGH SENSITIVITY): Troponin I (High Sensitivity): 77 ng/L — ABNORMAL HIGH (ref <18)

## 2019-12-02 LAB — CBC
HCT: 51.4 % (ref 39.0–52.0)
Hemoglobin: 15.5 g/dL (ref 13.0–17.0)
MCH: 30.3 pg (ref 26.0–34.0)
MCHC: 30.2 g/dL (ref 30.0–36.0)
MCV: 100.4 fL — ABNORMAL HIGH (ref 80.0–100.0)
Platelets: 137 10*3/uL — ABNORMAL LOW (ref 150–400)
RBC: 5.12 MIL/uL (ref 4.22–5.81)
RDW: 12.5 % (ref 11.5–15.5)
WBC: 7.2 10*3/uL (ref 4.0–10.5)
nRBC: 0.3 % — ABNORMAL HIGH (ref 0.0–0.2)

## 2019-12-02 LAB — BRAIN NATRIURETIC PEPTIDE: B Natriuretic Peptide: 35 pg/mL (ref 0.0–100.0)

## 2019-12-02 LAB — PROCALCITONIN: Procalcitonin: 0.14 ng/mL

## 2019-12-02 LAB — SARS CORONAVIRUS 2 BY RT PCR (HOSPITAL ORDER, PERFORMED IN ~~LOC~~ HOSPITAL LAB): SARS Coronavirus 2: POSITIVE — AB

## 2019-12-02 MED ORDER — SODIUM CHLORIDE 0.9 % IV SOLN
100.0000 mg | Freq: Every day | INTRAVENOUS | Status: DC
  Administered 2019-12-03 – 2019-12-04 (×2): 100 mg via INTRAVENOUS
  Filled 2019-12-02: qty 20
  Filled 2019-12-02: qty 100

## 2019-12-02 MED ORDER — ACETAMINOPHEN 500 MG PO TABS
1000.0000 mg | ORAL_TABLET | Freq: Once | ORAL | Status: AC
  Administered 2019-12-02: 1000 mg via ORAL
  Filled 2019-12-02: qty 2

## 2019-12-02 MED ORDER — DEXAMETHASONE SODIUM PHOSPHATE 10 MG/ML IJ SOLN
10.0000 mg | Freq: Once | INTRAMUSCULAR | Status: AC
  Administered 2019-12-02: 10 mg via INTRAVENOUS
  Filled 2019-12-02: qty 1

## 2019-12-02 MED ORDER — SODIUM CHLORIDE 0.9% FLUSH: 3.0000 mL | Freq: Once | INTRAVENOUS | Status: DC

## 2019-12-02 MED ORDER — SODIUM CHLORIDE 0.9 % IV SOLN
200.0000 mg | Freq: Once | INTRAVENOUS | Status: AC
  Administered 2019-12-02: 200 mg via INTRAVENOUS
  Filled 2019-12-02: qty 40

## 2019-12-02 MED ORDER — SODIUM BICARBONATE 8.4 % IV SOLN: 50.0000 meq | Freq: Once | INTRAVENOUS | Status: DC

## 2019-12-02 NOTE — Progress Notes (Signed)
Called by RN for pt desats. Placed pt HFNC prongs in correctly, increased FIO2 95% and placed SPO2 probe on pt ear. Pt SPO2 100%. Pt tol well at this time. Informed RN of changes and also that pt wanted something to eat.

## 2019-12-02 NOTE — ED Provider Notes (Signed)
[facility] Emergency Department Provider Note  ____________________________________________   I have reviewed the triage vital signs and the nursing notes.   HISTORY  Chief Complaint Shortness of Breath and Cough   History limited by: Not Limited   HPI Clifford Nichols is a 39 y.o. male who presents to the emergency department today because of concern for congestion, shortness of breath and cough. The patient states that his symptoms started roughly 3 days ago. The patient states that he has been trying otc medications without any significant relief. The patient denies any known history of lung disease. He does smoke. The patient has not received the covid vaccine. He has had fevers.   Records reviewed. Per medical record review patient has a history of DM, GERD  Past Medical History:  Diagnosis Date  . Abscess of upper back excluding scapular region   . Diabetes mellitus without complication (HCC)   . GERD (gastroesophageal reflux disease)    OCC-NO MEDS  . Hypertension   . Sebaceous cyst     Patient Active Problem List   Diagnosis Date Noted  . HTN (hypertension) 04/27/2018    Past Surgical History:  Procedure Laterality Date  . IRRIGATION AND DEBRIDEMENT SEBACEOUS CYST Right 07/11/2018   Procedure: EXCISION  SEBACEOUS CYST POSTERIOR SHOULDER AND MID-BACK;  Surgeon: Ancil Linsey, MD;  Location: [facility] ORS;  Service: General;  Laterality: Right;  . NO PAST SURGERIES      Prior to Admission medications   Not on File    Allergies Patient has no known allergies.  Family History  Problem Relation Age of Onset  . Hypertension Father     Social History Social History   Tobacco Use  . Smoking status: Former Smoker    Packs/day: 0.50    Types: Cigarettes    Quit date: 12/31/2017    Years since quitting: 1.9  . Smokeless tobacco: Never Used  Substance Use Topics  . Alcohol use: Not Currently  . Drug use: No    Review of  Systems Constitutional: Positive for fevers.  Eyes: No visual changes. ENT: Positive for congestion.  Cardiovascular: Denies chest pain. Respiratory: Positive for shortness of breath, cough. Gastrointestinal: No abdominal pain.  No nausea, no vomiting.  No diarrhea.   Genitourinary: Negative for dysuria. Musculoskeletal: Negative for back pain. Skin: Negative for rash. Neurological: Negative for headaches, focal weakness or numbness.  ____________________________________________   PHYSICAL EXAM:  VITAL SIGNS: ED Triage Vitals  Enc Vitals Group     BP 12/02/19 1519 101/61     Pulse Rate 12/02/19 1519 (!) 133     Resp 12/02/19 1519 20     Temp 12/02/19 1519 (!) 103.2 F (39.6 C)     Temp Source 12/02/19 1519 Oral     SpO2 12/02/19 1519 (!) 72 %     Weight 12/02/19 1523 (!) 500 lb (226.8 kg)     Height 12/02/19 1523 5\' 6"  (1.676 m)     Head Circumference --      Peak Flow --      Pain Score 12/02/19 1522 9   Constitutional: Alert and oriented.  Eyes: Conjunctivae are normal.  ENT      Head: Normocephalic and atraumatic.      Nose: No congestion/rhinnorhea.      Mouth/Throat: Mucous membranes are moist.      Neck: No stridor. Hematological/Lymphatic/Immunilogical: No cervical lymphadenopathy. Cardiovascular: Tachycardic, regular rhythm.  No murmurs, rubs, or gallops.  Respiratory: Slightly increased respiratory effort,  no abnormal breath sounds appreciated, however exam is limited secondary to body habitus.  Gastrointestinal: Soft and non tender. No rebound. No guarding.  Genitourinary: Deferred Musculoskeletal: Normal range of motion in all extremities. No lower extremity edema. Neurologic:  Normal speech and language. No gross focal neurologic deficits are appreciated.  Skin:  Skin is warm, dry and intact. No rash noted. Psychiatric: Mood and affect are normal. Speech and behavior are normal. Patient exhibits appropriate insight and  judgment.  ____________________________________________    LABS (pertinent positives/negatives)  BNP 35 CBC wbc 7.2, hgb 15.5, plt 137 BMP na 136, k 3.8, glu 154, cr 1.13 ____________________________________________   EKG  I, Nance Pear, attending physician, personally viewed and interpreted this EKG  EKG Time: 1529 Rate: 129 Rhythm: sinus tachycardia Axis: left axis deviation Intervals: qtc 460 QRS: narrow, LVH ST changes: no st elevation Impression: abnormal ekg   ____________________________________________    RADIOLOGY  CXR Opacities in mid and lower lungs bilaterally   ____________________________________________   PROCEDURES  Procedures  ____________________________________________   INITIAL IMPRESSION / ASSESSMENT AND PLAN / ED COURSE  Pertinent labs & imaging results that were available during my care of the patient were reviewed by me and considered in my medical decision making (see chart for details).   Patient presented to the emergency department because of concern for shortness of breath and congestion. Initial vital signs, clinical history and cxr from triage concerning for covid. Patient did test positive. Initially on nasal cannula however continued to desat to 88 on 6L. Did order high flow. Will plan on admission.  ____________________________________________   FINAL CLINICAL IMPRESSION(S) / ED DIAGNOSES  Final diagnoses:  COVID-19  Hypoxia     Note: This dictation was prepared with Dragon dictation. Any transcriptional errors that result from this process are unintentional     Nance Pear, MD 12/02/19 [phone]

## 2019-12-02 NOTE — ED Notes (Addendum)
Respiratory contacted about pt's oxygen saturation dropping to 85-88% while pt on high flow cannula. Respiratory coming to bedside at this time. MD Obie Dredge. No response at this time. Pt still alert and respirations appear improved from first arrival. Pt reports improvement in respiratory status.

## 2019-12-02 NOTE — ED Notes (Signed)
Patient placed on 2 liters canula during triage.  Ox came up to 87%. Increased oxygen to 3 liters and his pulse ox came up to 90%.  He is in a wheelchair and not exerting at all at this time.

## 2019-12-02 NOTE — ED Triage Notes (Signed)
Says congestion, cough for 3 days.  Says he is coughing up red phlegm.   Says no exposure to TB.

## 2019-12-02 NOTE — Consult Note (Signed)
Remdesivir - Pharmacy Brief Note   O:  ALT: N/A CXR: Prominence of the cardiac silhouette, although accentuated on this shallow inspiration radiograph. SpO2: 74% on RA   A/P:  Remdesivir 200 mg IVPB once followed by 100 mg IVPB daily x 4 days.   Bettey Costa, PharmD Clinical Pharmacist 12/02/2019 6:03 PM

## 2019-12-02 NOTE — ED Notes (Signed)
MD goodman aware of patient oxygen saturation 89% on 6L. Respiratory called to place pt on high flow nasal cannula.

## 2019-12-02 NOTE — H&P (Signed)
History and Physical   Clifford Nichols ALP:[id number] DOB: [birth year] DOA: 12/02/2019  Referring MD/NP/PA: Dr. Derrill Kay  PCP: [facility]   Outpatient Specialists: None  Patient coming from: Home  Chief Complaint: Shortness of breath and cough  HPI: Clifford Nichols is a 39 y.o. male with medical history significant of hypertension, diabetes, GERD not on any medication at the moment presenting with congestion shortness of breath or cough.  Symptoms started about 3 days ago.  Associated with sputum.  Patient also has fever and weakness.  He has felt weakness.  He was seen in the ER and evaluated.  Patient has service with COVID-19 screening positive.  He is hypoxic and has bilateral infiltrates.  Patient being admitted with acute respiratory failure due to COVID-19 pneumonia.  He denied any chest pain.  Could not remember where he contracted the virus..  ED Course: Temperature 103.2 blood pressure 110/78 pulse was 62 respirate 20 oxygen sat 72% on room air currently 86% on 2 L.  White count 7.2 hemoglobin 15.5 platelets 137.  Chloride 97 calcium 8.3.  Glucose 154.  Chest x-ray showed multifocal infiltrates.  Patient will be admitted for treatment of COVID-19 pneumonia.  Review of Systems: As per HPI otherwise 10 point review of systems negative.    Past Medical History:  Diagnosis Date  . Abscess of upper back excluding scapular region   . Diabetes mellitus without complication (HCC)   . GERD (gastroesophageal reflux disease)    OCC-NO MEDS  . Hypertension   . Sebaceous cyst     Past Surgical History:  Procedure Laterality Date  . IRRIGATION AND DEBRIDEMENT SEBACEOUS CYST Right 07/11/2018   Procedure: EXCISION  SEBACEOUS CYST POSTERIOR SHOULDER AND MID-BACK;  Surgeon: Ancil Linsey, MD;  Location: [facility] ORS;  Service: General;  Laterality: Right;  . NO PAST SURGERIES       reports that he quit smoking about 23 months ago. His smoking use included  cigarettes. He smoked 0.50 packs per day. He has never used smokeless tobacco. He reports previous alcohol use. He reports that he does not use drugs.  No Known Allergies  Family History  Problem Relation Age of Onset  . Hypertension Father      Prior to Admission medications   Not on File    Physical Exam: Vitals:   12/02/19 1523 12/02/19 1722 12/02/19 1730 12/02/19 1800  BP:   120/78 116/77  Pulse:   (!) 110 (!) 162  Resp:      Temp:  (!) 101.1 F (38.4 C)    TempSrc:  Oral    SpO2:   92% (!) 86%  Weight: (!) 226.8 kg     Height: 5\' 6"  (1.676 m)         Constitutional: Acutely ill looking no acute distress Vitals:   12/02/19 1523 12/02/19 1722 12/02/19 1730 12/02/19 1800  BP:   120/78 116/77  Pulse:   (!) 110 (!) 162  Resp:      Temp:  (!) 101.1 F (38.4 C)    TempSrc:  Oral    SpO2:   92% (!) 86%  Weight: (!) 226.8 kg     Height: 5\' 6"  (1.676 m)      Eyes: PERRL, lids and conjunctivae normal ENMT: Mucous membranes are moist. Posterior pharynx clear of any exudate or lesions.Normal dentition.  Neck: normal, supple, no masses, no thyromegaly Respiratory: Decreased air entry bilaterally with rhonchi and mild crackles normal respiratory effort. No accessory  muscle use.  Cardiovascular: Sinus tachycardia, no murmurs / rubs / gallops. No extremity edema. 2+ pedal pulses. No carotid bruits.  Abdomen: no tenderness, no masses palpated. No hepatosplenomegaly. Bowel sounds positive.  Musculoskeletal: no clubbing / cyanosis. No joint deformity upper and lower extremities. Good ROM, no contractures. Normal muscle tone.  Skin: no rashes, lesions, ulcers. No induration Neurologic: CN 2-12 grossly intact. Sensation intact, DTR normal. Strength 5/5 in all 4.  Psychiatric: Normal judgment and insight. Alert and oriented x 3. Normal mood.     Labs on Admission: I have personally reviewed following labs and imaging studies  CBC: Recent Labs  Lab 12/02/19 1537  WBC 7.2    HGB 15.5  HCT 51.4  MCV 100.4*  PLT 137*   Basic Metabolic Panel: Recent Labs  Lab 12/02/19 1537  NA 136  K 3.8  CL 97*  CO2 27  GLUCOSE 154*  BUN 10  CREATININE 1.13  CALCIUM 8.3*   GFR: Estimated Creatinine Clearance: 164.9 mL/min (by C-G formula based on SCr of 1.13 mg/dL). Liver Function Tests: No results for input(s): AST, ALT, ALKPHOS, BILITOT, PROT, ALBUMIN in the last 168 hours. No results for input(s): LIPASE, AMYLASE in the last 168 hours. No results for input(s): AMMONIA in the last 168 hours. Coagulation Profile: No results for input(s): INR, PROTIME in the last 168 hours. Cardiac Enzymes: No results for input(s): CKTOTAL, CKMB, CKMBINDEX, TROPONINI in the last 168 hours. BNP (last 3 results) No results for input(s): PROBNP in the last 8760 hours. HbA1C: No results for input(s): HGBA1C in the last 72 hours. CBG: No results for input(s): GLUCAP in the last 168 hours. Lipid Profile: No results for input(s): CHOL, HDL, LDLCALC, TRIG, CHOLHDL, LDLDIRECT in the last 72 hours. Thyroid Function Tests: No results for input(s): TSH, T4TOTAL, FREET4, T3FREE, THYROIDAB in the last 72 hours. Anemia Panel: No results for input(s): VITAMINB12, FOLATE, FERRITIN, TIBC, IRON, RETICCTPCT in the last 72 hours. Urine analysis:    Component Value Date/Time   COLORURINE ORANGE BIOCHEMICALS MAY BE AFFECTED BY COLOR (A) 08/15/2009 1350   APPEARANCEUR CLOUDY (A) 08/15/2009 1350   LABSPEC 1.026 08/15/2009 1350   PHURINE 6.0 08/15/2009 1350   GLUCOSEU NEGATIVE 08/15/2009 1350   HGBUR NEGATIVE 08/15/2009 1350   BILIRUBINUR SMALL (A) 08/15/2009 1350   KETONESUR 15 (A) 08/15/2009 1350   PROTEINUR 100 (A) 08/15/2009 1350   UROBILINOGEN 4.0 (H) 08/15/2009 1350   NITRITE NEGATIVE 08/15/2009 1350   LEUKOCYTESUR SMALL (A) 08/15/2009 1350   Sepsis Labs: @LABRCNTIP (procalcitonin:4,lacticidven:4) ) Recent Results (from the past 240 hour(s))  SARS Coronavirus 2 by RT PCR (hospital  order, performed in [facility] hospital lab) Nasopharyngeal Nasopharyngeal Swab     Status: Abnormal   Collection Time: 12/02/19  4:23 PM   Specimen: Nasopharyngeal Swab  Result Value Ref Range Status   SARS Coronavirus 2 POSITIVE (A) NEGATIVE Final    Comment: RESULT CALLED TO, READ BACK BY AND VERIFIED WITH: ANNIE SMITH ON 12/02/19 AT 1743 QSD (NOTE) SARS-CoV-2 target nucleic acids are DETECTED SARS-CoV-2 RNA is generally detectable in upper respiratory specimens  during the acute phase of infection.  Positive results are indicative  of the presence of the identified virus, but do not rule out bacterial infection or co-infection with other pathogens not detected by the test.  Clinical correlation with patient history and  other diagnostic information is necessary to determine patient infection status.  The expected result is negative. Fact Sheet for Patients:   02/01/20  Fact  Sheet for Healthcare Providers:   BankingDealers.co.za   This test is not yet approved or cleared by the [place] and  has been authorized for detection and/or diagnosis of SARS-CoV-2 by FDA under an Emergency Use Authorization (EUA).  This EUA will remain in effect (meaning this test can b e used) for the duration of  the COVID-19 declaration under Section 564(b)(1) of the Act, 21 U.S.C. section 360-bbb-3(b)(1), unless the authorization is terminated or revoked sooner. Performed at [facility], [address], [place], [place] [address]      Radiological Exams on Admission: DG Chest 2 View  Result Date: 12/02/2019 CLINICAL DATA:  Shortness of breath. Additional provided: Patient reports congestion, cough for 3 days, patient reports coughing up red phlegm, history of diabetes, GERD, hypertension, former smoker. EXAM: CHEST - 2 VIEW COMPARISON:  CT chest 04/26/2018, report from chest radiograph 09/12/2000 (images unavailable) FINDINGS:  The cardiac silhouette appears prominent, but is accentuated on this shallow inspiration radiograph. Central pulmonary vascular congestion. There opacities within the mid to lower lung fields bilaterally. No evidence of pleural effusion or pneumothorax. No acute bony abnormality. Mild chronic anterior wedging of mid to lower thoracic vertebrae. IMPRESSION: Prominence of the cardiac silhouette, although accentuated on this shallow inspiration radiograph. Central pulmonary vascular congestion. Opacities within the mid to lower lung fields bilaterally are nonspecific, but may reflect edema or pneumonia. Electronically Signed   By: Kellie Simmering DO   On: 12/02/2019 15:58    EKG: Independently reviewed.  Sinus tachycardia with no significant change from previous  Assessment/Plan Principal Problem:   Acute respiratory failure due to COVID-19 [facility]) Active Problems:   HTN (hypertension)   Hyperglycemia   Diabetes ([place])     #1 acute respiratory failure secondary to COVID-19 pneumonia: Patient will be admitted.  Initiate Decadron, remdesivir, antibiotics, breathing treatment and all COVID-19 supportive care.  We will continue close monitoring as patient gets better.  Oxygen as needed.  #2 diabetes: Not on any home medications.  Blood sugar 154.  Initiate sliding scale insulin if blood sugar rises especially in presence of steroids.  #3 hypertension: Blood pressure appears controlled at the moment.  We will continue treatment.   DVT prophylaxis: Lovenox Code Status: Full code Family Communication: No family at bedside Disposition Plan: Home Consults called: None Admission status: Inpatient  Severity of Illness: The appropriate patient status for this patient is INPATIENT. Inpatient status is judged to be reasonable and necessary in order to provide the required intensity of service to ensure the patient's safety. The patient's presenting symptoms, physical exam findings, and initial radiographic  and laboratory data in the context of their chronic comorbidities is felt to place them at high risk for further clinical deterioration. Furthermore, it is not anticipated that the patient will be medically stable for discharge from the hospital within 2 midnights of admission. The following factors support the patient status of inpatient.   " The patient's presenting symptoms include shortness of breath and cough. " The worrisome physical exam findings include rhonchi and crackles. " The initial radiographic and laboratory data are worrisome because of chest x-ray shows bilateral infiltrates. " The chronic co-morbidities include hypertension diabetes.   * I certify that at the point of admission it is my clinical judgment that the patient will require inpatient hospital care spanning beyond 2 midnights from the point of admission due to high intensity of service, high risk for further deterioration and high frequency of surveillance required.*  Lonia Blood MD [facility] Pager [phone]  If 7PM-7AM, please contact night-coverage [email or web address] Password [email or web address]  12/02/2019, 7:10 PM

## 2019-12-03 ENCOUNTER — Inpatient Hospital Stay: Payer: BLUE CROSS/BLUE SHIELD

## 2019-12-03 DIAGNOSIS — U071 COVID-19: Secondary | ICD-10-CM | POA: Diagnosis present

## 2019-12-03 DIAGNOSIS — J9600 Acute respiratory failure, unspecified whether with hypoxia or hypercapnia: Secondary | ICD-10-CM

## 2019-12-03 LAB — CBC WITH DIFFERENTIAL/PLATELET
Abs Immature Granulocytes: 0.03 10*3/uL (ref 0.00–0.07)
Basophils Absolute: 0 10*3/uL (ref 0.0–0.1)
Basophils Relative: 0 %
Eosinophils Absolute: 0 10*3/uL (ref 0.0–0.5)
Eosinophils Relative: 0 %
HCT: 50 % (ref 39.0–52.0)
Hemoglobin: 14.8 g/dL (ref 13.0–17.0)
Immature Granulocytes: 0 %
Lymphocytes Relative: 13 %
Lymphs Abs: 1 10*3/uL (ref 0.7–4.0)
MCH: 30.2 pg (ref 26.0–34.0)
MCHC: 29.6 g/dL — ABNORMAL LOW (ref 30.0–36.0)
MCV: 102 fL — ABNORMAL HIGH (ref 80.0–100.0)
Monocytes Absolute: 0.1 10*3/uL (ref 0.1–1.0)
Monocytes Relative: 2 %
Neutro Abs: 6.3 10*3/uL (ref 1.7–7.7)
Neutrophils Relative %: 85 %
Platelets: 120 10*3/uL — ABNORMAL LOW (ref 150–400)
RBC: 4.9 MIL/uL (ref 4.22–5.81)
RDW: 12.7 % (ref 11.5–15.5)
WBC: 7.4 10*3/uL (ref 4.0–10.5)
nRBC: 0.5 % — ABNORMAL HIGH (ref 0.0–0.2)

## 2019-12-03 LAB — BLOOD GAS, ARTERIAL
Acid-Base Excess: 1.4 mmol/L (ref 0.0–2.0)
Acid-Base Excess: 1.5 mmol/L (ref 0.0–2.0)
Bicarbonate: 32.8 mmol/L — ABNORMAL HIGH (ref 20.0–28.0)
Bicarbonate: 31.5 mmol/L — ABNORMAL HIGH (ref 20.0–28.0)
Expiratory PAP: 6
FIO2: 1
FIO2: 1
Inspiratory PAP: 20
MECHVT: 450 mL
Mode: POSITIVE
O2 Saturation: 93.1 %
O2 Saturation: 89.1 %
PEEP: 5 cmH2O
Patient temperature: 37
Patient temperature: 37
RATE: 16 resp/min
RATE: 20 resp/min
pCO2 arterial: 90 mmHg (ref 32.0–48.0)
pCO2 arterial: 77 mmHg (ref 32.0–48.0)
pH, Arterial: 7.17 — CL (ref 7.350–7.450)
pH, Arterial: 7.22 — ABNORMAL LOW (ref 7.350–7.450)
pO2, Arterial: 84 mmHg (ref 83.0–108.0)
pO2, Arterial: 68 mmHg — ABNORMAL LOW (ref 83.0–108.0)

## 2019-12-03 LAB — COMPREHENSIVE METABOLIC PANEL
ALT: 27 U/L (ref 0–44)
AST: 46 U/L — ABNORMAL HIGH (ref 15–41)
Albumin: 3.2 g/dL — ABNORMAL LOW (ref 3.5–5.0)
Alkaline Phosphatase: 47 U/L (ref 38–126)
Anion gap: 10 (ref 5–15)
BUN: 10 mg/dL (ref 6–20)
CO2: 29 mmol/L (ref 22–32)
Calcium: 8.1 mg/dL — ABNORMAL LOW (ref 8.9–10.3)
Chloride: 98 mmol/L (ref 98–111)
Creatinine, Ser: 1.02 mg/dL (ref 0.61–1.24)
GFR calc Af Amer: >60 mL/min (ref >60)
GFR calc non Af Amer: >60 mL/min (ref >60)
Glucose, Bld: 171 mg/dL — ABNORMAL HIGH (ref 70–99)
Potassium: 4.5 mmol/L (ref 3.5–5.1)
Sodium: 137 mmol/L (ref 135–145)
Total Bilirubin: 0.8 mg/dL (ref 0.3–1.2)
Total Protein: 8.1 g/dL (ref 6.5–8.1)

## 2019-12-03 LAB — FIBRIN DERIVATIVES D-DIMER (ARMC ONLY): Fibrin derivatives D-dimer (ARMC): 1673.88 ng/mL (FEU) — ABNORMAL HIGH (ref 0.00–499.00)

## 2019-12-03 LAB — LACTATE DEHYDROGENASE: LDH: 363 U/L — ABNORMAL HIGH (ref 98–192)

## 2019-12-03 LAB — C-REACTIVE PROTEIN: CRP: 13.3 mg/dL — ABNORMAL HIGH (ref <1.0)

## 2019-12-03 LAB — FERRITIN: Ferritin: 265 ng/mL (ref 24–336)

## 2019-12-03 LAB — ABO/RH: ABO/RH(D): A NEG

## 2019-12-03 LAB — GLUCOSE, CAPILLARY: Glucose-Capillary: 209 mg/dL — ABNORMAL HIGH (ref 70–99)

## 2019-12-03 MED ORDER — FENTANYL CITRATE (PF) 100 MCG/2ML IJ SOLN
50.0000 ug | Freq: Once | INTRAMUSCULAR | Status: AC
  Administered 2019-12-03: 50 ug via INTRAVENOUS

## 2019-12-03 MED ORDER — SODIUM CHLORIDE 0.9 % IV SOLN: 200.0000 mg | Freq: Once | INTRAVENOUS | Status: DC

## 2019-12-03 MED ORDER — IVERMECTIN 3 MG PO TABS
150.0000 ug/kg | ORAL_TABLET | Freq: Every evening | ORAL | Status: DC
  Administered 2019-12-03: 19500 ug
  Filled 2019-12-03 (×2): qty 7

## 2019-12-03 MED ORDER — KETAMINE HCL 10 MG/ML IJ SOLN
1.5000 mg/kg | Freq: Once | INTRAMUSCULAR | Status: AC
  Administered 2019-12-03: 150 mg via INTRAVENOUS

## 2019-12-03 MED ORDER — FENTANYL CITRATE (PF) 100 MCG/2ML IJ SOLN
INTRAMUSCULAR | Status: AC
  Administered 2019-12-03: 50 ug via INTRAVENOUS
  Filled 2019-12-03: qty 2

## 2019-12-03 MED ORDER — ENOXAPARIN SODIUM 120 MG/0.8ML ~~LOC~~ SOLN
0.5000 mg/kg | SUBCUTANEOUS | Status: DC
  Administered 2019-12-03: 115 mg via SUBCUTANEOUS
  Administered 2019-12-04: 85 mg via SUBCUTANEOUS
  Filled 2019-12-03 (×2): qty 0.8

## 2019-12-03 MED ORDER — POLYETHYLENE GLYCOL 3350 17 G PO PACK
17.0000 g | PACK | Freq: Every day | ORAL | Status: DC
  Administered 2019-12-04: 17 g via ORAL
  Filled 2019-12-03: qty 1

## 2019-12-03 MED ORDER — FENTANYL BOLUS VIA INFUSION
50.0000 ug | INTRAVENOUS | Status: DC | PRN
  Filled 2019-12-03: qty 50

## 2019-12-03 MED ORDER — SODIUM CHLORIDE 0.9 % IV SOLN
1.0000 g | INTRAVENOUS | Status: DC
  Administered 2019-12-03: 1 g via INTRAVENOUS
  Filled 2019-12-03: qty 10

## 2019-12-03 MED ORDER — GUAIFENESIN-DM 100-10 MG/5ML PO SYRP
10.0000 mL | ORAL_SOLUTION | ORAL | Status: DC | PRN
  Filled 2019-12-03: qty 10

## 2019-12-03 MED ORDER — ENOXAPARIN SODIUM 40 MG/0.4ML ~~LOC~~ SOLN
40.0000 mg | Freq: Two times a day (BID) | SUBCUTANEOUS | Status: DC
  Filled 2019-12-03: qty 0.4

## 2019-12-03 MED ORDER — ASCORBIC ACID 500 MG PO TABS
500.0000 mg | ORAL_TABLET | Freq: Every day | ORAL | Status: DC
  Administered 2019-12-03 – 2019-12-04 (×2): 500 mg via ORAL
  Filled 2019-12-03 (×2): qty 1

## 2019-12-03 MED ORDER — FAMOTIDINE IN NACL 20-0.9 MG/50ML-% IV SOLN
20.0000 mg | INTRAVENOUS | Status: DC
  Administered 2019-12-03: 20 mg via INTRAVENOUS
  Filled 2019-12-03: qty 50

## 2019-12-03 MED ORDER — ONDANSETRON HCL 4 MG/2ML IJ SOLN: 4.0000 mg | Freq: Four times a day (QID) | INTRAMUSCULAR | Status: DC | PRN

## 2019-12-03 MED ORDER — ONDANSETRON HCL 4 MG PO TABS: 4.0000 mg | ORAL_TABLET | Freq: Four times a day (QID) | ORAL | Status: DC | PRN

## 2019-12-03 MED ORDER — HYDROCOD POLST-CPM POLST ER 10-8 MG/5ML PO SUER: 5.0000 mL | Freq: Two times a day (BID) | ORAL | Status: DC | PRN

## 2019-12-03 MED ORDER — INSULIN ASPART 100 UNIT/ML ~~LOC~~ SOLN
0.0000 [IU] | SUBCUTANEOUS | Status: DC
  Administered 2019-12-03 – 2019-12-04 (×4): 7 [IU] via SUBCUTANEOUS
  Filled 2019-12-03 (×4): qty 1

## 2019-12-03 MED ORDER — NOREPINEPHRINE 16 MG/250ML-% IV SOLN
0.0000 ug/min | INTRAVENOUS | Status: DC
  Administered 2019-12-03: 8 ug/min via INTRAVENOUS
  Filled 2019-12-03: qty 250

## 2019-12-03 MED ORDER — IPRATROPIUM-ALBUTEROL 20-100 MCG/ACT IN AERS
1.0000 | INHALATION_SPRAY | Freq: Four times a day (QID) | RESPIRATORY_TRACT | Status: DC
  Administered 2019-12-03 (×3): 1 via RESPIRATORY_TRACT
  Filled 2019-12-03: qty 4

## 2019-12-03 MED ORDER — VECURONIUM BROMIDE 10 MG IV SOLR
10.0000 mg | INTRAVENOUS | Status: DC | PRN
  Administered 2019-12-03 – 2019-12-04 (×3): 10 mg via INTRAVENOUS
  Filled 2019-12-03 (×4): qty 10

## 2019-12-03 MED ORDER — CHLORHEXIDINE GLUCONATE CLOTH 2 % EX PADS
6.0000 | MEDICATED_PAD | Freq: Every day | CUTANEOUS | Status: DC
  Administered 2019-12-03: 6 via TOPICAL

## 2019-12-03 MED ORDER — SODIUM CHLORIDE 0.9 % IV SOLN: 100.0000 mg | Freq: Every day | INTRAVENOUS | Status: DC

## 2019-12-03 MED ORDER — SODIUM CHLORIDE 0.9 % IV SOLN
500.0000 mg | INTRAVENOUS | Status: DC
  Administered 2019-12-03: 500 mg via INTRAVENOUS
  Filled 2019-12-03: qty 500

## 2019-12-03 MED ORDER — ETOMIDATE 2 MG/ML IV SOLN: 30.0000 mg | Freq: Once | INTRAVENOUS | Status: DC

## 2019-12-03 MED ORDER — ROCURONIUM BROMIDE 50 MG/5ML IV SOLN
200.0000 mg | Freq: Once | INTRAVENOUS | Status: AC
  Administered 2019-12-03: 150 mg via INTRAVENOUS
  Filled 2019-12-03: qty 20

## 2019-12-03 MED ORDER — IVERMECTIN 3 MG PO TABS
200.0000 ug/kg | ORAL_TABLET | Freq: Once | ORAL | Status: AC
  Administered 2019-12-03: 45000 ug via ORAL
  Filled 2019-12-03: qty 15

## 2019-12-03 MED ORDER — PROPOFOL 1000 MG/100ML IV EMUL
INTRAVENOUS | Status: AC
  Filled 2019-12-03: qty 100

## 2019-12-03 MED ORDER — NOREPINEPHRINE 4 MG/250ML-% IV SOLN
0.0000 ug/min | INTRAVENOUS | Status: DC
  Administered 2019-12-03: 4 ug/min via INTRAVENOUS
  Filled 2019-12-03: qty 250

## 2019-12-03 MED ORDER — PROPOFOL 1000 MG/100ML IV EMUL
0.0000 ug/kg/min | INTRAVENOUS | Status: DC
  Administered 2019-12-03: 30 ug/kg/min via INTRAVENOUS
  Administered 2019-12-03 (×2): 40 ug/kg/min via INTRAVENOUS
  Administered 2019-12-03: 20 ug/kg/min via INTRAVENOUS
  Administered 2019-12-04: 40 ug/kg/min via INTRAVENOUS
  Administered 2019-12-04: 50 ug/kg/min via INTRAVENOUS
  Administered 2019-12-04: 20 ug/kg/min via INTRAVENOUS
  Administered 2019-12-04: 50 ug/kg/min via INTRAVENOUS
  Filled 2019-12-03 (×9): qty 100

## 2019-12-03 MED ORDER — CHLORHEXIDINE GLUCONATE 0.12% ORAL RINSE (MEDLINE KIT)
15.0000 mL | Freq: Two times a day (BID) | OROMUCOSAL | Status: DC
  Administered 2019-12-03 – 2019-12-04 (×2): 15 mL via OROMUCOSAL
  Filled 2019-12-03 (×2): qty 15

## 2019-12-03 MED ORDER — DEXAMETHASONE SODIUM PHOSPHATE 10 MG/ML IJ SOLN
6.0000 mg | INTRAMUSCULAR | Status: DC
  Administered 2019-12-03 – 2019-12-04 (×2): 6 mg via INTRAVENOUS
  Filled 2019-12-03: qty 0.6
  Filled 2019-12-03: qty 1

## 2019-12-03 MED ORDER — ZINC SULFATE 220 (50 ZN) MG PO CAPS
220.0000 mg | ORAL_CAPSULE | Freq: Every day | ORAL | Status: DC
  Administered 2019-12-03 – 2019-12-04 (×2): 220 mg via ORAL
  Filled 2019-12-03 (×2): qty 1

## 2019-12-03 MED ORDER — ORAL CARE MOUTH RINSE
15.0000 mL | OROMUCOSAL | Status: DC
  Administered 2019-12-03 – 2019-12-04 (×6): 15 mL via OROMUCOSAL
  Filled 2019-12-03 (×7): qty 15

## 2019-12-03 MED ORDER — DOCUSATE SODIUM 50 MG/5ML PO LIQD
100.0000 mg | Freq: Two times a day (BID) | ORAL | Status: DC
  Administered 2019-12-03 – 2019-12-04 (×2): 100 mg via ORAL
  Filled 2019-12-03 (×3): qty 10

## 2019-12-03 MED ORDER — FENTANYL 2500MCG IN NS 250ML (10MCG/ML) PREMIX INFUSION
0.0000 ug/h | INTRAVENOUS | Status: DC
  Administered 2019-12-03: 50 ug/h via INTRAVENOUS
  Administered 2019-12-03: 200 ug/h via INTRAVENOUS
  Administered 2019-12-04: 400 ug/h via INTRAVENOUS
  Administered 2019-12-04: 01:00:00 300 ug/h via INTRAVENOUS
  Filled 2019-12-03 (×4): qty 250

## 2019-12-03 NOTE — ED Notes (Signed)
Intermittent tube suction performed OG tubed hooked to low intermittent suction Oral secretions suctioned

## 2019-12-03 NOTE — Consult Note (Addendum)
MEDICATION RELATED CONSULT NOTE  Pharmacy Consult for Ivermectin Indication: COVID-19  No Known Allergies  Patient Measurements: Height: 5\' 6"  (167.6 cm) Weight: (!) 226.8 kg (500 lb) IBW/kg (Calculated) : 63.8  Vital Signs: BP: 118/105 (05/11 0700) Pulse Rate: 100 (05/11 0830) Intake/Output from previous day: No intake/output data recorded. Intake/Output from this shift: Total I/O In: 95.9 [IV Piggyback:95.9] Out: -   Labs: Recent Labs    12/02/19 1537 12/03/19 0058  WBC 7.2 7.4  HGB 15.5 14.8  HCT 51.4 50.0  PLT 137* 120*  CREATININE 1.13 1.02  ALBUMIN  --  3.2*  PROT  --  8.1  AST  --  46*  ALT  --  27  ALKPHOS  --  47  BILITOT  --  0.8   Estimated Creatinine Clearance: 182.7 mL/min (by C-G formula based on SCr of 1.02 mg/dL).   Assessment/Plan:  39 yo super morbidly obese male with COVID-19 PNA.  Pharmacy has been consulted to dose Ivermectin.    Discussed via direct chat wioth Dr 31 and will place 1 time order of 27mcg/kg.  Please monitor pt for new onset itching, swelling, arthralgia, and fever.  80m, PharmD, BCPS Clinical Pharmacist 12/03/2019 9:12 AM

## 2019-12-03 NOTE — Progress Notes (Signed)
The Clinical status was relayed to family in detail. Wife updated over the phone  Updated and notified of patients medical condition.   with high probability very low chance of meaningful recovery despite all aggressive and optimal medical therapy.  Family understands the situation.   Family are satisfied with Plan of action and management. All questions answered  Additional CC time 31 mins   Veron Senner Santiago Glad, M.D.  [facility]  Medical Director [facility] Medical Director [facility] Cardio-Pulmonary Department

## 2019-12-03 NOTE — ED Notes (Signed)
PT suddenly agitated and combative. See MAR OG pulled out

## 2019-12-03 NOTE — ED Provider Notes (Signed)
I was asked to emergently evaluate the patient due to worsening respiratory status.  The patient has been admitted for acute hypoxic respiratory failure secondary to Covid.  I suspect he has been intermittently obstructing and now has developed hypercapnic respiratory failure.  He is drowsy on arrival and apneic when he falls asleep.  I am highly concerned he may not be able to protect his airway.  I called and notified the hospitalist who referred me to Dr. Belia Heman of ICU.  Based on his fairly rapid deterioration, decision made by myself and Dr. Belia Heman to intubate in ED.  He tolerated the procedure well and will admit to the ICU. Although drowsy, I was able to notify patient of the intubation prior to the procedure and he was in agreement.  .Critical Care Performed by: Shaune Pollack, MD Authorized by: Shaune Pollack, MD   Critical care provider statement:    Critical care time (minutes):  35   Critical care time was exclusive of:  Separately billable procedures and treating other patients and teaching time   Critical care was necessary to treat or prevent imminent or life-threatening deterioration of the following conditions:  Circulatory failure, cardiac failure and respiratory failure   Critical care was time spent personally by me on the following activities:  Development of treatment plan with patient or surrogate, discussions with consultants, evaluation of patient's response to treatment, examination of patient, obtaining history from patient or surrogate, ordering and performing treatments and interventions, ordering and review of laboratory studies, ordering and review of radiographic studies, pulse oximetry, re-evaluation of patient's condition and review of old charts   I assumed direction of critical care for this patient from another provider in my specialty: no   Procedure Name: Intubation Date/Time: 12/03/2019 3:14 PM Performed by: Shaune Pollack, MD Pre-anesthesia Checklist: Patient  identified, Patient being monitored, Emergency Drugs available, Timeout performed and Suction available Oxygen Delivery Method: Non-rebreather mask Preoxygenation: Pre-oxygenation with 100% oxygen Induction Type: Rapid sequence Ventilation: Mask ventilation without difficulty Laryngoscope Size: Glidescope and 4 Grade View: Grade I Tube size: 8.0 mm Number of attempts: 1 Airway Equipment and Method: Rigid stylet and Video-laryngoscopy Placement Confirmation: ETT inserted through vocal cords under direct vision,  CO2 detector and Breath sounds checked- equal and bilateral Secured at: 26 cm Tube secured with: ETT holder Dental Injury: Teeth and Oropharynx as per pre-operative assessment  Difficulty Due To: Difficulty was anticipated, Difficult Airway- due to large tongue and Difficult Airway- due to reduced neck mobility Future Recommendations: Recommend- induction with short-acting agent, and alternative techniques readily available         Shaune Pollack, MD 12/03/19 1514

## 2019-12-03 NOTE — ED Notes (Signed)
Pt placed on bipap by RT

## 2019-12-03 NOTE — ED Notes (Signed)
Messaged MD regarding severe bilateral proptosis

## 2019-12-03 NOTE — ED Notes (Signed)
Pt continuously encouraged to sit up in bed as his sat levels drop when he lays down. Also with continuous movement the patient's leads and pulse ox come off. Pt encouraged to take deep breaths through his nose and to exhale through his mouth.

## 2019-12-03 NOTE — ED Notes (Signed)
Have attempted to call report

## 2019-12-03 NOTE — ED Notes (Addendum)
This RN attempted to notify admitting MD of pt's decreasing O2 level.

## 2019-12-03 NOTE — Progress Notes (Signed)
PROGRESS NOTE    Clifford Nichols  NUU:[id number] DOB: [birth year] DOA: 12/02/2019 PCP: [facility] [place]      Assessment & Plan:   Principal Problem:   Acute respiratory failure due to COVID-19 [facility]) Active Problems:   HTN (hypertension)   Hyperglycemia   Diabetes ([place])  Acute hypoxic respiratory failure: secondary to COVID19 pneumonia. Continue on IV remdesivir, decadron, vitamin C & zinc. Will d/c abxs as procal is WNL. S/p ivermectin x 1. Continue on bronchodilators. Encourage incentive spirometry and flutter valve. Continue on supplemental oxygen and wean as tolerated. High risk for intubation. Will transfer pt to stepdown. Dr. Mortimer Fries is aware of the pt   COVID19 pneumonia: Continue on IV remdesivir, decadron, vitamin C & zinc. Will d/c abxs as procal is WNL. S/p ivermectin x 1. Continue on bronchodilators. Encourage incentive spirometry and flutter valve. Continue on supplemental oxygen and wean as tolerated   Thrombocytopenia: likely secondary to [place]. Will continue to monitor  Transaminitis: ALT is WNL, AST is mildly elevated. Likely secondary to [place].   DM2: continue on SSI w/ accuchecks. Not on any meds at home for DM  Morbid obesity: BMI 80.7. Needs significant weight loss.   Likely OSA: was scheduled outpatient for sleep study but pt did not do as he could not afford the test, so unable to get CPAP    DVT prophylaxis: lovenox Code Status: full  Family Communication: discussed pt's care w/ pt's wife, [name], and and answered her questions  Disposition Plan: likely d/c back home. Unlikely any barriers   Consultants:    Procedures:   Antimicrobials:    Subjective: Pt c/o shortness of breath.  Objective: Vitals:   12/03/19 0345 12/03/19 0400 12/03/19 0641 12/03/19 0645  BP:   111/72   Pulse: (!) 113 (!) 114 (!) 110 (!) 110  Resp:    (!) 26  Temp:      TempSrc:      SpO2: 97% 97% 96% 100%  Weight:      Height:         Intake/Output Summary (Last 24 hours) at 12/03/2019 0800 Last data filed at 12/03/2019 3474 Gross per 24 hour  Intake 95.9 ml  Output --  Net 95.9 ml   Filed Weights   12/02/19 1523  Weight: (!) 226.8 kg    Examination:  General exam: Appears calm but appears uncomfortable  Respiratory system: diminished breath sounds b/l. Cardiovascular system: S1 & S2 +. No  rubs, gallops or clicks.  Gastrointestinal system: Abdomen is nondistended, soft and nontender.  Normal bowel sounds heard. Central nervous system: Awake & falls asleep while talking. Moves all 4 extremities Psychiatry: Judgement and insight appear normal. Mood & affect appropriate.     Data Reviewed: I have personally reviewed following labs and imaging studies  CBC: Recent Labs  Lab 12/02/19 1537 12/03/19 0058  WBC 7.2 7.4  NEUTROABS  --  6.3  HGB 15.5 14.8  HCT 51.4 50.0  MCV 100.4* 102.0*  PLT 137* 259*   Basic Metabolic Panel: Recent Labs  Lab 12/02/19 1537 12/03/19 0058  NA 136 137  K 3.8 4.5  CL 97* 98  CO2 27 29  GLUCOSE 154* 171*  BUN 10 10  CREATININE 1.13 1.02  CALCIUM 8.3* 8.1*   GFR: Estimated Creatinine Clearance: 182.7 mL/min (by C-G formula based on SCr of 1.02 mg/dL). Liver Function Tests: Recent Labs  Lab 12/03/19 0058  AST 46*  ALT 27  ALKPHOS 47  BILITOT 0.8  PROT 8.1  ALBUMIN 3.2*   No results for input(s): LIPASE, AMYLASE in the last 168 hours. No results for input(s): AMMONIA in the last 168 hours. Coagulation Profile: No results for input(s): INR, PROTIME in the last 168 hours. Cardiac Enzymes: No results for input(s): CKTOTAL, CKMB, CKMBINDEX, TROPONINI in the last 168 hours. BNP (last 3 results) No results for input(s): PROBNP in the last 8760 hours. HbA1C: No results for input(s): HGBA1C in the last 72 hours. CBG: No results for input(s): GLUCAP in the last 168 hours. Lipid Profile: No results for input(s): CHOL, HDL, LDLCALC, TRIG, CHOLHDL, LDLDIRECT in  the last 72 hours. Thyroid Function Tests: No results for input(s): TSH, T4TOTAL, FREET4, T3FREE, THYROIDAB in the last 72 hours. Anemia Panel: Recent Labs    12/03/19 0058  FERRITIN 265   Sepsis Labs: Recent Labs  Lab 12/02/19 1537  PROCALCITON 0.14    Recent Results (from the past 240 hour(s))  SARS Coronavirus 2 by RT PCR (hospital order, performed in [facility] hospital lab) Nasopharyngeal Nasopharyngeal Swab     Status: Abnormal   Collection Time: 12/02/19  4:23 PM   Specimen: Nasopharyngeal Swab  Result Value Ref Range Status   SARS Coronavirus 2 POSITIVE (A) NEGATIVE Final    Comment: RESULT CALLED TO, READ BACK BY AND VERIFIED WITH: ANNIE SMITH ON 12/02/19 AT 1743 QSD (NOTE) SARS-CoV-2 target nucleic acids are DETECTED SARS-CoV-2 RNA is generally detectable in upper respiratory specimens  during the acute phase of infection.  Positive results are indicative  of the presence of the identified virus, but do not rule out bacterial infection or co-infection with other pathogens not detected by the test.  Clinical correlation with patient history and  other diagnostic information is necessary to determine patient infection status.  The expected result is negative. Fact Sheet for Patients:   BoilerBrush.com.cy  Fact Sheet for Healthcare Providers:   https://pope.com/   This test is not yet approved or cleared by the [place] FDA and  has been authorized for detection and/or diagnosis of SARS-CoV-2 by FDA under an Emergency Use Authorization (EUA).  This EUA will remain in effect (meaning this test can b e used) for the duration of  the COVID-19 declaration under Section 564(b)(1) of the Act, 21 U.S.C. section 360-bbb-3(b)(1), unless the authorization is terminated or revoked sooner. Performed at [facility], [address] [place], [place] [address]          Radiology Studies: DG Chest 2  View  Result Date: 12/02/2019 CLINICAL DATA:  Shortness of breath. Additional provided: Patient reports congestion, cough for 3 days, patient reports coughing up red phlegm, history of diabetes, GERD, hypertension, former smoker. EXAM: CHEST - 2 VIEW COMPARISON:  CT chest 04/26/2018, report from chest radiograph 09/12/2000 (images unavailable) FINDINGS: The cardiac silhouette appears prominent, but is accentuated on this shallow inspiration radiograph. Central pulmonary vascular congestion. There opacities within the mid to lower lung fields bilaterally. No evidence of pleural effusion or pneumothorax. No acute bony abnormality. Mild chronic anterior wedging of mid to lower thoracic vertebrae. IMPRESSION: Prominence of the cardiac silhouette, although accentuated on this shallow inspiration radiograph. Central pulmonary vascular congestion. Opacities within the mid to lower lung fields bilaterally are nonspecific, but may reflect edema or pneumonia. Electronically Signed   By: Jackey Loge DO   On: 12/02/2019 15:58        Scheduled Meds: . vitamin C  500 mg Oral Daily  . dexamethasone (DECADRON) injection  6 mg  Intravenous Q24H  . enoxaparin (LOVENOX) injection  40 mg Subcutaneous Q12H  . Ipratropium-Albuterol  1 puff Inhalation Q6H  . sodium chloride flush  3 mL Intravenous Once  . zinc sulfate  220 mg Oral Daily   Continuous Infusions: . azithromycin 500 mg (12/03/19 0727)  . cefTRIAXone (ROCEPHIN)  IV Stopped (12/03/19 0705)  . remdesivir 100 mg in NS 100 mL       LOS: 1 day    Time spent: 35 mins    Charise Killian, MD [facility] Pager [phone]  If 7PM-7AM, please contact night-coverage [email or web address] 12/03/2019, 8:00 AM

## 2019-12-03 NOTE — Progress Notes (Signed)
Patient transported to CT via transport  vent with RN. 

## 2019-12-03 NOTE — H&P (Signed)
Name: Clifford Nichols MRN: [id number] DOB: [birth year]     CONSULTATION DATE: 12/02/2019  REFERRING MD : Erma Heritage  CHIEF COMPLAINT: SOB   HISTORY OF PRESENT ILLNESS: 39 yo obese AAM with COVID 19 PNEUMONIA -medical history significant of hypertension, diabetes, GERD not on any medication at the moment presenting with congestion shortness of breath or cough.    Symptoms started about 3 days ago.  Associated with sputum.  Patient also has fever and weakness.  He has felt weakness.    He was seen in the ER and evaluated.   COVID-19 screening positive.  He is hypoxic and has bilateral infiltrates.  Patient being admitted with acute respiratory failure due to COVID-19 pneumonia. Could not remember where he contracted the virus.  ED Course: Temperature 103.2 blood pressure 110/78 pulse was 62 respirate 20 oxygen sat 72% on room air currently 86% on 2 L.  White count 7.2 hemoglobin 15.5 platelets 137.  Chloride 97 calcium 8.3.  Glucose 154.  Chest x-ray showed multifocal infiltrates.  Patient will be admitted for treatment of COVID-19 pneumonia. Patient with progressive resp failure, emergently intubated  PAST MEDICAL HISTORY :   has a past medical history of Abscess of upper back excluding scapular region, Diabetes mellitus without complication (HCC), GERD (gastroesophageal reflux disease), Hypertension, and Sebaceous cyst.  has a past surgical history that includes No past surgeries and Irrigation and debridement sabaceous cyst (Right, 07/11/2018). Prior to Admission medications   Not on File   No Known Allergies  FAMILY HISTORY:  family history includes Hypertension in his father. SOCIAL HISTORY:  reports that he quit smoking about 23 months ago. His smoking use included cigarettes. He smoked 0.50 packs per day. He has never used smokeless tobacco. He reports previous alcohol use. He reports that he does not use drugs.  REVIEW OF SYSTEMS:   Unable to obtain due to critical  illness      Estimated body mass index is 80.7 kg/m as calculated from the following:   Height as of this encounter: 5\' 6"  (1.676 m).   Weight as of this encounter: 226.8 kg.    VITAL SIGNS: Temp:  [97.6 F (36.4 C)-103.2 F (39.6 C)] 97.6 F (36.4 C) (05/11 1234) Pulse Rate:  [90-162] 90 (05/11 1400) Resp:  [20-26] 22 (05/11 1234) BP: (101-142)/(61-105) 138/82 (05/11 1400) SpO2:  [71 %-100 %] 97 % (05/11 1400) FiO2 (%):  [79 %-100 %] 100 % (05/11 1145) Weight:  [226.8 kg] 226.8 kg (05/10 1523)   No intake/output data recorded. Total I/O In: 95.9 [IV Piggyback:95.9] Out: -    SpO2: 97 % O2 Flow Rate (L/min): 50 L/min FiO2 (%): 100 %   Physical Examination:  GENERAL:critically ill appearing, +resp distress HEAD: Normocephalic, atraumatic.  EYES: Pupils equal, round, reactive to light.  No scleral icterus.  MOUTH: Moist mucosal membrane. NECK: Supple. No JVD.  PULMONARY: +rhonchi, +wheezing CARDIOVASCULAR: S1 and S2. Regular rate and rhythm. No murmurs, rubs, or gallops.  GASTROINTESTINAL: Soft, nontender, -distended.  Positive bowel sounds.  MUSCULOSKELETAL: No swelling, clubbing, or edema.  NEUROLOGIC: obtunded SKIN:intact,warm,dry  I personally reviewed lab work that was obtained in last 24 hrs. CXR Independently reviewed-b/l opacities  MEDICATIONS: I have reviewed all medications and confirmed regimen as documented   CULTURE RESULTS   Recent Results (from the past 240 hour(s))  SARS Coronavirus 2 by RT PCR (hospital order, performed in [facility] hospital lab) Nasopharyngeal Nasopharyngeal Swab     Status: Abnormal   Collection Time: 12/02/19  4:23 PM   Specimen: Nasopharyngeal Swab  Result Value Ref Range Status   SARS Coronavirus 2 POSITIVE (A) NEGATIVE Final    Comment: RESULT CALLED TO, READ BACK BY AND VERIFIED WITH: ANNIE SMITH ON 12/02/19 AT 1743 QSD (NOTE) SARS-CoV-2 target nucleic acids are DETECTED SARS-CoV-2 RNA is generally detectable  in upper respiratory specimens  during the acute phase of infection.  Positive results are indicative  of the presence of the identified virus, but do not rule out bacterial infection or co-infection with other pathogens not detected by the test.  Clinical correlation with patient history and  other diagnostic information is necessary to determine patient infection status.  The expected result is negative. Fact Sheet for Patients:   BoilerBrush.com.cy  Fact Sheet for Healthcare Providers:   https://pope.com/   This test is not yet approved or cleared by the [place] FDA and  has been authorized for detection and/or diagnosis of SARS-CoV-2 by FDA under an Emergency Use Authorization (EUA).  This EUA will remain in effect (meaning this test can b e used) for the duration of  the COVID-19 declaration under Section 564(b)(1) of the Act, 21 U.S.C. section 360-bbb-3(b)(1), unless the authorization is terminated or revoked sooner. Performed at [facility], [address], [place], [place] [address]           IMAGING    DG Chest 2 View  Result Date: 12/02/2019 CLINICAL DATA:  Shortness of breath. Additional provided: Patient reports congestion, cough for 3 days, patient reports coughing up red phlegm, history of diabetes, GERD, hypertension, former smoker. EXAM: CHEST - 2 VIEW COMPARISON:  CT chest 04/26/2018, report from chest radiograph 09/12/2000 (images unavailable) FINDINGS: The cardiac silhouette appears prominent, but is accentuated on this shallow inspiration radiograph. Central pulmonary vascular congestion. There opacities within the mid to lower lung fields bilaterally. No evidence of pleural effusion or pneumothorax. No acute bony abnormality. Mild chronic anterior wedging of mid to lower thoracic vertebrae. IMPRESSION: Prominence of the cardiac silhouette, although accentuated on this shallow inspiration radiograph.  Central pulmonary vascular congestion. Opacities within the mid to lower lung fields bilaterally are nonspecific, but may reflect edema or pneumonia. Electronically Signed   By: Jackey Loge DO   On: 12/02/2019 15:58   BMP Latest Ref Rng & Units 12/03/2019 12/02/2019 07/03/2018  Glucose 70 - 99 mg/dL 470(J) 628(Z) 662(H)  BUN 6 - 20 mg/dL 10 10 8   Creatinine 0.61 - 1.24 mg/dL 4.76 5.46  Sodium 135 - 145 mmol/L 137 136 138  Potassium 3.5 - 5.1 mmol/L 4.5 3.8 3.8  Chloride 98 - 111 mmol/L 98 97(L) 103  CO2 22 - 32 mmol/L 29 27 27   Calcium 8.9 - 10.3 mg/dL 8.1(L) 8.3(L) 8.6(L)       Indwelling Urinary Catheter continued, requirement due to   Reason to continue Indwelling Urinary Catheter strict Intake/Output monitoring for hemodynamic instability         Ventilator continued, requirement due to severe respiratory failure   Ventilator Sedation RASS 0 to -2      ASSESSMENT AND PLAN SYNOPSIS  Severe COVID-19 infection, ARDS and pneumonia/pneumonitis Continue IV steroids  IV remdisivir proning as tolerated due to severe hypoxia   Maintain airborne and contact precautions  As needed bronchodilators (MDI) Vitamin C and zinc Antitussives High risk for  death   Severe ACUTE Hypoxic and Hypercapnic Respiratory Failure -continue Full MV support -continue Bronchodilator Therapy -Wean Fio2 and PEEP as tolerated -VAP/VENT bundle implementation  NEUROLOGY - intubated and sedated - minimal sedation to achieve a RASS goal: -1    CARDIAC ICU monitoring  ID -continue IV abx as prescibed -follow up cultures  GI GI PROPHYLAXIS as indicated  NUTRITIONAL STATUS DIET-->TF's as tolerated Constipation protocol as indicated   ENDO - will use ICU hypoglycemic\Hyperglycemia protocol if needed    ELECTROLYTES -follow labs as needed -replace as needed -pharmacy consultation and following   DVT/GI PRX ordered TRANSFUSIONS AS NEEDED MONITOR FSBS ASSESS the need  for LABS    Critical Care Time devoted to patient care services described in this note is 45 minutes.   Overall, patient is critically ill, prognosis is guarded.  Patient with Multiorgan failure and at high risk for cardiac arrest and death.    Corrin Parker, M.D.  [facility]  Medical Director [place] Director [facility] Cardio-Pulmonary Department

## 2019-12-03 NOTE — Procedures (Signed)
Central Venous Catheter Insertion Procedure Note TERREL NESHEIWAT [id number] August 08, 1980  Procedure: Insertion of Central Venous Catheter Indications: Assessment of intravascular volume, Drug and/or fluid administration and Frequent blood sampling  Procedure Details Consent: Unable to obtain consent because of emergent medical necessity. Time Out: Verified patient identification, verified procedure, site/side was marked, verified correct patient position, special equipment/implants available, medications/allergies/relevent history reviewed, required imaging and test results available.  Performed  Maximum sterile technique was used including antiseptics, cap, gloves, gown, hand hygiene, mask and sheet. Skin prep: Chlorhexidine; local anesthetic administered A antimicrobial bonded/coated triple lumen catheter was placed in the left femoral vein due to emergent situation using the Seldinger technique.  Evaluation Blood flow good Complications: No apparent complications Patient did tolerate procedure well. Chest X-ray ordered to verify placement.  CXR: Not needed, placed in left femoral vein.    Procedure was performed using Ultrasound for direct visualization of cannulization of Left Femoral Vein.  Line was secured at the 20 cm mark. BIOPATCH placed to insertion site.    Harlon Ditty, AGACNP-BC [facility] Pager: [phone]  Judithe Modest 12/03/2019, 9:29 PM

## 2019-12-03 NOTE — ED Notes (Signed)
OG replaced. Orders for DG for placement verification

## 2019-12-03 NOTE — ED Notes (Signed)
RT at bedside to adjust vent settings.

## 2019-12-03 NOTE — ED Notes (Signed)
RT called to assess patient due to decreasing Sat level of 86-88%.

## 2019-12-03 NOTE — ED Notes (Signed)
Pt transported to CT with RN and respiratory.  

## 2019-12-03 NOTE — Progress Notes (Signed)
PHARMACIST - PHYSICIAN COMMUNICATION  CONCERNING:  Enoxaparin (Lovenox) for DVT Prophylaxis    RECOMMENDATION: Patient was prescribed enoxaprin 40mg  q24 hours for VTE prophylaxis.   Filed Weights   12/02/19 1523  Weight: (!) 226.8 kg (500 lb)    Body mass index is 80.7 kg/m.  Estimated Creatinine Clearance: 182.7 mL/min (by C-G formula based on SCr of 1.02 mg/dL).   Based on [facility] policy patient is candidate for enoxaparin 40mg  every 12 hour dosing due to BMI being >40.  DESCRIPTION: Pharmacy has adjusted enoxaparin dose per [facility] policy.  Patient is now receiving enoxaparin 40mg  every 12 hours.    , PharmD Clinical Pharmacist  12/03/2019 7:21 AM

## 2019-12-03 NOTE — Progress Notes (Addendum)
Found pt sitting on bench against the wall without leads, oxygen, spo2 probe. Pt SOB, Diaphoretic and complaining of back pain. Moved bench for patient closer to monitor and HFNC. Pt SPO2 57%, placed pt back on HFNC 50L @ 95%. Pt Spo2 increased to 95%. Went over flutter valve with patient and IS. Gave pt blanket, pillow, cup of water and remote control. Informed RN of findings.

## 2019-12-04 ENCOUNTER — Inpatient Hospital Stay (HOSPITAL_COMMUNITY): Payer: No Typology Code available for payment source

## 2019-12-04 ENCOUNTER — Inpatient Hospital Stay (HOSPITAL_COMMUNITY)
Admission: AD | Admit: 2019-12-04 | Discharge: 2020-01-13 | DRG: 004 | Disposition: A | Discharge status: Rehabilitation Facility | Payer: No Typology Code available for payment source | Source: Other Acute Inpatient Hospital | Attending: Internal Medicine | Admitting: Internal Medicine

## 2019-12-04 ENCOUNTER — Inpatient Hospital Stay: Payer: BLUE CROSS/BLUE SHIELD

## 2019-12-04 DIAGNOSIS — Z794 Long term (current) use of insulin: Secondary | ICD-10-CM | POA: Diagnosis not present

## 2019-12-04 DIAGNOSIS — E870 Hyperosmolality and hypernatremia: Secondary | ICD-10-CM | POA: Diagnosis not present

## 2019-12-04 DIAGNOSIS — I5023 Acute on chronic systolic (congestive) heart failure: Secondary | ICD-10-CM | POA: Diagnosis not present

## 2019-12-04 DIAGNOSIS — G4700 Insomnia, unspecified: Secondary | ICD-10-CM | POA: Diagnosis present

## 2019-12-04 DIAGNOSIS — D539 Nutritional anemia, unspecified: Secondary | ICD-10-CM | POA: Diagnosis present

## 2019-12-04 DIAGNOSIS — A4189 Other specified sepsis: Principal | ICD-10-CM | POA: Diagnosis present

## 2019-12-04 DIAGNOSIS — D62 Acute posthemorrhagic anemia: Secondary | ICD-10-CM | POA: Diagnosis present

## 2019-12-04 DIAGNOSIS — R069 Unspecified abnormalities of breathing: Secondary | ICD-10-CM

## 2019-12-04 DIAGNOSIS — M722 Plantar fascial fibromatosis: Secondary | ICD-10-CM | POA: Diagnosis present

## 2019-12-04 DIAGNOSIS — I10 Essential (primary) hypertension: Secondary | ICD-10-CM | POA: Diagnosis present

## 2019-12-04 DIAGNOSIS — D6489 Other specified anemias: Secondary | ICD-10-CM | POA: Diagnosis present

## 2019-12-04 DIAGNOSIS — J9601 Acute respiratory failure with hypoxia: Secondary | ICD-10-CM

## 2019-12-04 DIAGNOSIS — E876 Hypokalemia: Secondary | ICD-10-CM | POA: Diagnosis not present

## 2019-12-04 DIAGNOSIS — J80 Acute respiratory distress syndrome: Secondary | ICD-10-CM | POA: Diagnosis present

## 2019-12-04 DIAGNOSIS — K219 Gastro-esophageal reflux disease without esophagitis: Secondary | ICD-10-CM | POA: Diagnosis present

## 2019-12-04 DIAGNOSIS — Z6841 Body Mass Index (BMI) 40.0 and over, adult: Secondary | ICD-10-CM

## 2019-12-04 DIAGNOSIS — R197 Diarrhea, unspecified: Secondary | ICD-10-CM | POA: Diagnosis not present

## 2019-12-04 DIAGNOSIS — J159 Unspecified bacterial pneumonia: Secondary | ICD-10-CM | POA: Diagnosis not present

## 2019-12-04 DIAGNOSIS — U071 COVID-19: Secondary | ICD-10-CM | POA: Diagnosis present

## 2019-12-04 DIAGNOSIS — Z79899 Other long term (current) drug therapy: Secondary | ICD-10-CM | POA: Diagnosis not present

## 2019-12-04 DIAGNOSIS — D638 Anemia in other chronic diseases classified elsewhere: Secondary | ICD-10-CM | POA: Diagnosis present

## 2019-12-04 DIAGNOSIS — Z8701 Personal history of pneumonia (recurrent): Secondary | ICD-10-CM

## 2019-12-04 DIAGNOSIS — J9622 Acute and chronic respiratory failure with hypercapnia: Secondary | ICD-10-CM

## 2019-12-04 DIAGNOSIS — Z9889 Other specified postprocedural states: Secondary | ICD-10-CM | POA: Diagnosis not present

## 2019-12-04 DIAGNOSIS — Z1624 Resistance to multiple antibiotics: Secondary | ICD-10-CM | POA: Diagnosis present

## 2019-12-04 DIAGNOSIS — R739 Hyperglycemia, unspecified: Secondary | ICD-10-CM | POA: Diagnosis present

## 2019-12-04 DIAGNOSIS — M7989 Other specified soft tissue disorders: Secondary | ICD-10-CM | POA: Diagnosis not present

## 2019-12-04 DIAGNOSIS — N179 Acute kidney failure, unspecified: Secondary | ICD-10-CM | POA: Diagnosis present

## 2019-12-04 DIAGNOSIS — L89153 Pressure ulcer of sacral region, stage 3: Secondary | ICD-10-CM | POA: Diagnosis present

## 2019-12-04 DIAGNOSIS — G4733 Obstructive sleep apnea (adult) (pediatric): Secondary | ICD-10-CM | POA: Diagnosis not present

## 2019-12-04 DIAGNOSIS — Z8249 Family history of ischemic heart disease and other diseases of the circulatory system: Secondary | ICD-10-CM

## 2019-12-04 DIAGNOSIS — G9341 Metabolic encephalopathy: Secondary | ICD-10-CM | POA: Diagnosis present

## 2019-12-04 DIAGNOSIS — R5381 Other malaise: Secondary | ICD-10-CM | POA: Diagnosis present

## 2019-12-04 DIAGNOSIS — M21371 Foot drop, right foot: Secondary | ICD-10-CM | POA: Diagnosis present

## 2019-12-04 DIAGNOSIS — Z9911 Dependence on respirator [ventilator] status: Secondary | ICD-10-CM | POA: Diagnosis not present

## 2019-12-04 DIAGNOSIS — F17210 Nicotine dependence, cigarettes, uncomplicated: Secondary | ICD-10-CM | POA: Diagnosis present

## 2019-12-04 DIAGNOSIS — J1282 Pneumonia due to coronavirus disease 2019: Secondary | ICD-10-CM | POA: Diagnosis present

## 2019-12-04 DIAGNOSIS — K140 Glossitis: Secondary | ICD-10-CM | POA: Diagnosis not present

## 2019-12-04 DIAGNOSIS — R000 Tachycardia, unspecified: Secondary | ICD-10-CM | POA: Diagnosis present

## 2019-12-04 DIAGNOSIS — Z978 Presence of other specified devices: Secondary | ICD-10-CM

## 2019-12-04 DIAGNOSIS — Z8616 Personal history of COVID-19: Secondary | ICD-10-CM | POA: Diagnosis not present

## 2019-12-04 DIAGNOSIS — Z87891 Personal history of nicotine dependence: Secondary | ICD-10-CM

## 2019-12-04 DIAGNOSIS — J9600 Acute respiratory failure, unspecified whether with hypoxia or hypercapnia: Secondary | ICD-10-CM

## 2019-12-04 DIAGNOSIS — G92 Toxic encephalopathy: Secondary | ICD-10-CM | POA: Diagnosis present

## 2019-12-04 DIAGNOSIS — E1165 Type 2 diabetes mellitus with hyperglycemia: Secondary | ICD-10-CM | POA: Diagnosis present

## 2019-12-04 DIAGNOSIS — E781 Pure hyperglyceridemia: Secondary | ICD-10-CM | POA: Diagnosis present

## 2019-12-04 DIAGNOSIS — R6521 Severe sepsis with septic shock: Secondary | ICD-10-CM | POA: Diagnosis present

## 2019-12-04 DIAGNOSIS — J189 Pneumonia, unspecified organism: Secondary | ICD-10-CM

## 2019-12-04 DIAGNOSIS — J9621 Acute and chronic respiratory failure with hypoxia: Secondary | ICD-10-CM | POA: Diagnosis not present

## 2019-12-04 DIAGNOSIS — E119 Type 2 diabetes mellitus without complications: Secondary | ICD-10-CM | POA: Diagnosis present

## 2019-12-04 DIAGNOSIS — Z930 Tracheostomy status: Secondary | ICD-10-CM

## 2019-12-04 DIAGNOSIS — E0810 Diabetes mellitus due to underlying condition with ketoacidosis without coma: Secondary | ICD-10-CM | POA: Diagnosis not present

## 2019-12-04 DIAGNOSIS — E874 Mixed disorder of acid-base balance: Secondary | ICD-10-CM | POA: Diagnosis present

## 2019-12-04 DIAGNOSIS — D696 Thrombocytopenia, unspecified: Secondary | ICD-10-CM | POA: Diagnosis present

## 2019-12-04 DIAGNOSIS — Z4659 Encounter for fitting and adjustment of other gastrointestinal appliance and device: Secondary | ICD-10-CM

## 2019-12-04 DIAGNOSIS — M21372 Foot drop, left foot: Secondary | ICD-10-CM | POA: Diagnosis present

## 2019-12-04 DIAGNOSIS — R609 Edema, unspecified: Secondary | ICD-10-CM | POA: Diagnosis not present

## 2019-12-04 DIAGNOSIS — Z452 Encounter for adjustment and management of vascular access device: Secondary | ICD-10-CM

## 2019-12-04 DIAGNOSIS — E0800 Diabetes mellitus due to underlying condition with hyperosmolarity without nonketotic hyperglycemic-hyperosmolar coma (NKHHC): Secondary | ICD-10-CM | POA: Diagnosis not present

## 2019-12-04 DIAGNOSIS — G6281 Critical illness polyneuropathy: Secondary | ICD-10-CM | POA: Diagnosis not present

## 2019-12-04 DIAGNOSIS — H0520 Unspecified exophthalmos: Secondary | ICD-10-CM | POA: Diagnosis present

## 2019-12-04 DIAGNOSIS — J9690 Respiratory failure, unspecified, unspecified whether with hypoxia or hypercapnia: Secondary | ICD-10-CM

## 2019-12-04 DIAGNOSIS — L8990 Pressure ulcer of unspecified site, unspecified stage: Secondary | ICD-10-CM | POA: Insufficient documentation

## 2019-12-04 LAB — BLOOD GAS, ARTERIAL
Acid-Base Excess: 0.9 mmol/L (ref 0.0–2.0)
Acid-Base Excess: 1.7 mmol/L (ref 0.0–2.0)
Bicarbonate: 29.4 mmol/L — ABNORMAL HIGH (ref 20.0–28.0)
Bicarbonate: 30.1 mmol/L — ABNORMAL HIGH (ref 20.0–28.0)
FIO2: 100
FIO2: 0.8
MECHVT: 500 mL
MECHVT: 500 mL
Mechanical Rate: 24
O2 Saturation: 92.7 %
O2 Saturation: 93.9 %
PEEP: 8 cmH2O
PEEP: 8 cmH2O
Patient temperature: 37
Patient temperature: 37
RATE: 28 resp/min
pCO2 arterial: 64 mmHg — ABNORMAL HIGH (ref 32.0–48.0)
pCO2 arterial: 64 mmHg — ABNORMAL HIGH (ref 32.0–48.0)
pH, Arterial: 7.27 — ABNORMAL LOW (ref 7.350–7.450)
pH, Arterial: 7.28 — ABNORMAL LOW (ref 7.350–7.450)
pO2, Arterial: 75 mmHg — ABNORMAL LOW (ref 83.0–108.0)
pO2, Arterial: 79 mmHg — ABNORMAL LOW (ref 83.0–108.0)

## 2019-12-04 LAB — BASIC METABOLIC PANEL
Anion gap: 12 (ref 5–15)
BUN: 35 mg/dL — ABNORMAL HIGH (ref 6–20)
CO2: 28 mmol/L (ref 22–32)
Calcium: 8.4 mg/dL — ABNORMAL LOW (ref 8.9–10.3)
Chloride: 101 mmol/L (ref 98–111)
Creatinine, Ser: 2.99 mg/dL — ABNORMAL HIGH (ref 0.61–1.24)
GFR calc Af Amer: 30 mL/min — ABNORMAL LOW (ref >60)
GFR calc non Af Amer: 26 mL/min — ABNORMAL LOW (ref >60)
Glucose, Bld: 171 mg/dL — ABNORMAL HIGH (ref 70–99)
Potassium: 4.9 mmol/L (ref 3.5–5.1)
Sodium: 141 mmol/L (ref 135–145)

## 2019-12-04 LAB — CBC WITH DIFFERENTIAL/PLATELET
Abs Immature Granulocytes: 0.03 10*3/uL (ref 0.00–0.07)
Basophils Absolute: 0 10*3/uL (ref 0.0–0.1)
Basophils Relative: 0 %
Eosinophils Absolute: 0 10*3/uL (ref 0.0–0.5)
Eosinophils Relative: 0 %
HCT: 47.7 % (ref 39.0–52.0)
Hemoglobin: 14.5 g/dL (ref 13.0–17.0)
Immature Granulocytes: 0 %
Lymphocytes Relative: 10 %
Lymphs Abs: 0.8 10*3/uL (ref 0.7–4.0)
MCH: 30.8 pg (ref 26.0–34.0)
MCHC: 30.4 g/dL (ref 30.0–36.0)
MCV: 101.3 fL — ABNORMAL HIGH (ref 80.0–100.0)
Monocytes Absolute: 0.4 10*3/uL (ref 0.1–1.0)
Monocytes Relative: 5 %
Neutro Abs: 7.3 10*3/uL (ref 1.7–7.7)
Neutrophils Relative %: 85 %
Platelets: 144 10*3/uL — ABNORMAL LOW (ref 150–400)
RBC: 4.71 MIL/uL (ref 4.22–5.81)
RDW: 12.3 % (ref 11.5–15.5)
Smear Review: NORMAL
WBC: 8.6 10*3/uL (ref 4.0–10.5)
nRBC: 0.2 % (ref 0.0–0.2)

## 2019-12-04 LAB — CBC
HCT: 47.5 % (ref 39.0–52.0)
Hemoglobin: 13.6 g/dL (ref 13.0–17.0)
MCH: 30 pg (ref 26.0–34.0)
MCHC: 28.6 g/dL — ABNORMAL LOW (ref 30.0–36.0)
MCV: 104.9 fL — ABNORMAL HIGH (ref 80.0–100.0)
Platelets: 131 10*3/uL — ABNORMAL LOW (ref 150–400)
RBC: 4.53 MIL/uL (ref 4.22–5.81)
RDW: 12.2 % (ref 11.5–15.5)
WBC: 7.4 10*3/uL (ref 4.0–10.5)
nRBC: 0.4 % — ABNORMAL HIGH (ref 0.0–0.2)

## 2019-12-04 LAB — GLUCOSE, CAPILLARY
Glucose-Capillary: 179 mg/dL — ABNORMAL HIGH (ref 70–99)
Glucose-Capillary: 209 mg/dL — ABNORMAL HIGH (ref 70–99)
Glucose-Capillary: 233 mg/dL — ABNORMAL HIGH (ref 70–99)
Glucose-Capillary: 233 mg/dL — ABNORMAL HIGH (ref 70–99)
Glucose-Capillary: 150 mg/dL — ABNORMAL HIGH (ref 70–99)
Glucose-Capillary: 148 mg/dL — ABNORMAL HIGH (ref 70–99)
Glucose-Capillary: 143 mg/dL — ABNORMAL HIGH (ref 70–99)

## 2019-12-04 LAB — FERRITIN: Ferritin: 352 ng/mL — ABNORMAL HIGH (ref 24–336)

## 2019-12-04 LAB — HIV ANTIBODY (ROUTINE TESTING W REFLEX): HIV Screen 4th Generation wRfx: NONREACTIVE

## 2019-12-04 LAB — PROTIME-INR
INR: 1 (ref 0.8–1.2)
Prothrombin Time: 13.2 seconds (ref 11.4–15.2)

## 2019-12-04 LAB — TYPE AND SCREEN
ABO/RH(D): A NEG
Antibody Screen: NEGATIVE

## 2019-12-04 LAB — POCT I-STAT 7, (LYTES, BLD GAS, ICA,H+H)
Acid-Base Excess: 3 mmol/L — ABNORMAL HIGH (ref 0.0–2.0)
Acid-Base Excess: 2 mmol/L (ref 0.0–2.0)
Acid-Base Excess: 3 mmol/L — ABNORMAL HIGH (ref 0.0–2.0)
Bicarbonate: 31.4 mmol/L — ABNORMAL HIGH (ref 20.0–28.0)
Bicarbonate: 30.5 mmol/L — ABNORMAL HIGH (ref 20.0–28.0)
Bicarbonate: 32.8 mmol/L — ABNORMAL HIGH (ref 20.0–28.0)
Calcium, Ion: 1.18 mmol/L (ref 1.15–1.40)
Calcium, Ion: 1.18 mmol/L (ref 1.15–1.40)
Calcium, Ion: 1.2 mmol/L (ref 1.15–1.40)
HCT: 46 % (ref 39.0–52.0)
HCT: 45 % (ref 39.0–52.0)
HCT: 45 % (ref 39.0–52.0)
Hemoglobin: 15.6 g/dL (ref 13.0–17.0)
Hemoglobin: 15.3 g/dL (ref 13.0–17.0)
Hemoglobin: 15.3 g/dL (ref 13.0–17.0)
O2 Saturation: 94 %
O2 Saturation: 93 %
O2 Saturation: 86 %
Patient temperature: 99.2
Potassium: 4.8 mmol/L (ref 3.5–5.1)
Potassium: 4.7 mmol/L (ref 3.5–5.1)
Potassium: 4.5 mmol/L (ref 3.5–5.1)
Sodium: 139 mmol/L (ref 135–145)
Sodium: 139 mmol/L (ref 135–145)
Sodium: 140 mmol/L (ref 135–145)
TCO2: 33 mmol/L — ABNORMAL HIGH (ref 22–32)
TCO2: 32 mmol/L (ref 22–32)
TCO2: 35 mmol/L — ABNORMAL HIGH (ref 22–32)
pCO2 arterial: 63 mmHg — ABNORMAL HIGH (ref 32.0–48.0)
pCO2 arterial: 60.6 mmHg — ABNORMAL HIGH (ref 32.0–48.0)
pCO2 arterial: 77.1 mmHg (ref 32.0–48.0)
pH, Arterial: 7.305 — ABNORMAL LOW (ref 7.350–7.450)
pH, Arterial: 7.309 — ABNORMAL LOW (ref 7.350–7.450)
pH, Arterial: 7.238 — ABNORMAL LOW (ref 7.350–7.450)
pO2, Arterial: 78 mmHg — ABNORMAL LOW (ref 83.0–108.0)
pO2, Arterial: 75 mmHg — ABNORMAL LOW (ref 83.0–108.0)
pO2, Arterial: 64 mmHg — ABNORMAL LOW (ref 83.0–108.0)

## 2019-12-04 LAB — ABO/RH: ABO/RH(D): A NEG

## 2019-12-04 LAB — COMPREHENSIVE METABOLIC PANEL
ALT: 23 U/L (ref 0–44)
AST: 39 U/L (ref 15–41)
Albumin: 3.2 g/dL — ABNORMAL LOW (ref 3.5–5.0)
Alkaline Phosphatase: 39 U/L (ref 38–126)
Anion gap: 13 (ref 5–15)
BUN: 30 mg/dL — ABNORMAL HIGH (ref 6–20)
CO2: 27 mmol/L (ref 22–32)
Calcium: 8.1 mg/dL — ABNORMAL LOW (ref 8.9–10.3)
Chloride: 99 mmol/L (ref 98–111)
Creatinine, Ser: 2.11 mg/dL — ABNORMAL HIGH (ref 0.61–1.24)
GFR calc Af Amer: 45 mL/min — ABNORMAL LOW (ref >60)
GFR calc non Af Amer: 39 mL/min — ABNORMAL LOW (ref >60)
Glucose, Bld: 234 mg/dL — ABNORMAL HIGH (ref 70–99)
Potassium: 5 mmol/L (ref 3.5–5.1)
Sodium: 139 mmol/L (ref 135–145)
Total Bilirubin: 1.2 mg/dL (ref 0.3–1.2)
Total Protein: 7.8 g/dL (ref 6.5–8.1)

## 2019-12-04 LAB — C-REACTIVE PROTEIN: CRP: 6.3 mg/dL — ABNORMAL HIGH (ref <1.0)

## 2019-12-04 LAB — FIBRIN DERIVATIVES D-DIMER (ARMC ONLY): Fibrin derivatives D-dimer (ARMC): 1350.28 ng/mL (FEU) — ABNORMAL HIGH (ref 0.00–499.00)

## 2019-12-04 LAB — HEMOGLOBIN A1C
Hgb A1c MFr Bld: 6.6 % — ABNORMAL HIGH (ref 4.8–5.6)
Mean Plasma Glucose: 142.72 mg/dL

## 2019-12-04 LAB — MAGNESIUM: Magnesium: 2.5 mg/dL — ABNORMAL HIGH (ref 1.7–2.4)

## 2019-12-04 LAB — MRSA PCR SCREENING: MRSA by PCR: NEGATIVE

## 2019-12-04 LAB — D-DIMER, QUANTITATIVE: D-Dimer, Quant: 1.08 ug/mL-FEU — ABNORMAL HIGH (ref 0.00–0.50)

## 2019-12-04 LAB — PROCALCITONIN: Procalcitonin: 0.42 ng/mL

## 2019-12-04 LAB — TRIGLYCERIDES: Triglycerides: 715 mg/dL — ABNORMAL HIGH (ref <150)

## 2019-12-04 MED ORDER — MIDAZOLAM HCL 2 MG/2ML IJ SOLN
2.0000 mg | INTRAMUSCULAR | Status: DC | PRN
  Administered 2019-12-04 (×2): 2 mg via INTRAVENOUS
  Filled 2019-12-04 (×2): qty 2

## 2019-12-04 MED ORDER — ONDANSETRON HCL 4 MG PO TABS: 4.0000 mg | ORAL_TABLET | Freq: Four times a day (QID) | ORAL | 0 refills | Status: DC | PRN

## 2019-12-04 MED ORDER — TOCILIZUMAB 400 MG/20ML IV SOLN
800.0000 mg | Freq: Once | INTRAVENOUS | Status: AC
  Administered 2019-12-04: 800 mg via INTRAVENOUS
  Filled 2019-12-04: qty 40

## 2019-12-04 MED ORDER — IPRATROPIUM-ALBUTEROL 20-100 MCG/ACT IN AERS: 1.0000 | INHALATION_SPRAY | Freq: Four times a day (QID) | RESPIRATORY_TRACT | Status: DC

## 2019-12-04 MED ORDER — CHLORHEXIDINE GLUCONATE CLOTH 2 % EX PADS
6.0000 | MEDICATED_PAD | Freq: Every day | CUTANEOUS | Status: DC
  Administered 2019-12-04 – 2020-01-12 (×38): 6 via TOPICAL

## 2019-12-04 MED ORDER — DOCUSATE SODIUM 50 MG/5ML PO LIQD: 100.0000 mg | Freq: Two times a day (BID) | ORAL | 0 refills | Status: DC

## 2019-12-04 MED ORDER — MIDAZOLAM HCL 2 MG/2ML IJ SOLN
INTRAMUSCULAR | Status: AC
  Administered 2019-12-04: 4 mg via INTRAVENOUS
  Filled 2019-12-04: qty 4

## 2019-12-04 MED ORDER — PROPOFOL 1000 MG/100ML IV EMUL: 0.0000 ug/kg/min | INTRAVENOUS | Status: DC

## 2019-12-04 MED ORDER — DEXMEDETOMIDINE HCL IN NACL 400 MCG/100ML IV SOLN
0.4000 ug/kg/h | INTRAVENOUS | Status: DC
  Administered 2019-12-04: 0.8 ug/kg/h via INTRAVENOUS
  Filled 2019-12-04 (×2): qty 100

## 2019-12-04 MED ORDER — DEXAMETHASONE SODIUM PHOSPHATE 10 MG/ML IJ SOLN: 6.0000 mg | INTRAMUSCULAR | 0 refills | Status: DC

## 2019-12-04 MED ORDER — NOREPINEPHRINE 4 MG/250ML-% IV SOLN: 0.0000 ug/min | INTRAVENOUS | Status: DC

## 2019-12-04 MED ORDER — ENOXAPARIN SODIUM 120 MG/0.8ML ~~LOC~~ SOLN: 0.5000 mg/kg | SUBCUTANEOUS | Status: DC

## 2019-12-04 MED ORDER — DEXMEDETOMIDINE HCL IN NACL 400 MCG/100ML IV SOLN: 0.4000 ug/kg/h | INTRAVENOUS | 0 refills | Status: DC

## 2019-12-04 MED ORDER — DEXAMETHASONE SODIUM PHOSPHATE 10 MG/ML IJ SOLN
6.0000 mg | Freq: Every day | INTRAMUSCULAR | Status: AC
  Administered 2019-12-05 – 2019-12-12 (×8): 6 mg via INTRAVENOUS
  Filled 2019-12-04 (×9): qty 1

## 2019-12-04 MED ORDER — MIDAZOLAM 50MG/50ML (1MG/ML) PREMIX INFUSION
0.0000 mg/h | INTRAVENOUS | Status: DC
  Administered 2019-12-04: 15:00:00 6 mg/h via INTRAVENOUS
  Administered 2019-12-04: 23:00:00 7 mg/h via INTRAVENOUS
  Administered 2019-12-05 (×2): 10 mg/h via INTRAVENOUS
  Administered 2019-12-05: 7 mg/h via INTRAVENOUS
  Administered 2019-12-05 – 2019-12-09 (×17): 10 mg/h via INTRAVENOUS
  Filled 2019-12-04 (×22): qty 50

## 2019-12-04 MED ORDER — VECURONIUM BROMIDE 10 MG IV SOLR: 10.0000 mg | INTRAVENOUS | Status: DC | PRN

## 2019-12-04 MED ORDER — DOCUSATE SODIUM 100 MG PO CAPS: 100.0000 mg | ORAL_CAPSULE | Freq: Two times a day (BID) | ORAL | Status: DC | PRN

## 2019-12-04 MED ORDER — POLYETHYLENE GLYCOL 3350 17 G PO PACK: 17.0000 g | PACK | Freq: Every day | ORAL | Status: DC

## 2019-12-04 MED ORDER — MIDAZOLAM HCL 2 MG/2ML IJ SOLN: 2.0000 mg | INTRAMUSCULAR | 0 refills | Status: DC | PRN

## 2019-12-04 MED ORDER — FAMOTIDINE 40 MG/5ML PO SUSR
20.0000 mg | Freq: Two times a day (BID) | ORAL | Status: DC
  Administered 2019-12-04 – 2020-01-07 (×70): 20 mg
  Filled 2019-12-04 (×71): qty 2.5

## 2019-12-04 MED ORDER — ZINC SULFATE 220 (50 ZN) MG PO CAPS
220.0000 mg | ORAL_CAPSULE | Freq: Every day | ORAL | Status: DC
  Administered 2019-12-05 – 2020-01-11 (×38): 220 mg
  Filled 2019-12-04 (×38): qty 1

## 2019-12-04 MED ORDER — ALBUTEROL SULFATE (2.5 MG/3ML) 0.083% IN NEBU
2.5000 mg | INHALATION_SOLUTION | RESPIRATORY_TRACT | Status: DC | PRN
  Administered 2020-01-01: 2.5 mg via RESPIRATORY_TRACT
  Filled 2019-12-04 (×2): qty 3

## 2019-12-04 MED ORDER — POLYETHYLENE GLYCOL 3350 17 G PO PACK
17.0000 g | PACK | Freq: Every day | ORAL | Status: DC
  Administered 2019-12-05 – 2019-12-17 (×13): 17 g
  Filled 2019-12-04 (×14): qty 1

## 2019-12-04 MED ORDER — IVERMECTIN 3 MG PO TABS: 150.0000 ug/kg | ORAL_TABLET | Freq: Every evening | ORAL | Status: DC

## 2019-12-04 MED ORDER — CHLORHEXIDINE GLUCONATE 0.12% ORAL RINSE (MEDLINE KIT): 15.0000 mL | Freq: Two times a day (BID) | OROMUCOSAL | 0 refills | Status: DC

## 2019-12-04 MED ORDER — GUAIFENESIN-DM 100-10 MG/5ML PO SYRP: 10.0000 mL | ORAL_SOLUTION | ORAL | 0 refills | Status: DC | PRN

## 2019-12-04 MED ORDER — FENTANYL 2500MCG IN NS 250ML (10MCG/ML) PREMIX INFUSION: 0.0000 ug/h | INTRAVENOUS | Status: DC

## 2019-12-04 MED ORDER — FAMOTIDINE IN NACL 20-0.9 MG/50ML-% IV SOLN: 20.0000 mg | INTRAVENOUS | Status: DC

## 2019-12-04 MED ORDER — FENTANYL CITRATE (PF) 100 MCG/2ML IJ SOLN: 50.0000 ug | Freq: Once | INTRAMUSCULAR | Status: DC

## 2019-12-04 MED ORDER — INSULIN ASPART 100 UNIT/ML ~~LOC~~ SOLN
0.0000 [IU] | SUBCUTANEOUS | Status: DC
  Administered 2019-12-04 (×3): 3 [IU] via SUBCUTANEOUS
  Administered 2019-12-05 (×2): 4 [IU] via SUBCUTANEOUS
  Administered 2019-12-05 – 2019-12-06 (×3): 3 [IU] via SUBCUTANEOUS
  Administered 2019-12-06 (×2): 4 [IU] via SUBCUTANEOUS
  Administered 2019-12-06: 3 [IU] via SUBCUTANEOUS
  Administered 2019-12-06: 4 [IU] via SUBCUTANEOUS
  Administered 2019-12-07: 3 [IU] via SUBCUTANEOUS
  Administered 2019-12-07 – 2019-12-08 (×3): 4 [IU] via SUBCUTANEOUS
  Administered 2019-12-08: 3 [IU] via SUBCUTANEOUS
  Administered 2019-12-08: 4 [IU] via SUBCUTANEOUS
  Administered 2019-12-08 – 2019-12-09 (×2): 3 [IU] via SUBCUTANEOUS
  Administered 2019-12-09: 7 [IU] via SUBCUTANEOUS
  Administered 2019-12-09: 4 [IU] via SUBCUTANEOUS
  Administered 2019-12-09: 3 [IU] via SUBCUTANEOUS
  Administered 2019-12-10: 4 [IU] via SUBCUTANEOUS
  Administered 2019-12-10: 3 [IU] via SUBCUTANEOUS
  Administered 2019-12-10: 11 [IU] via SUBCUTANEOUS
  Administered 2019-12-10 (×2): 3 [IU] via SUBCUTANEOUS
  Administered 2019-12-10: 4 [IU] via SUBCUTANEOUS
  Administered 2019-12-11: 7 [IU] via SUBCUTANEOUS
  Administered 2019-12-11 (×2): 3 [IU] via SUBCUTANEOUS
  Administered 2019-12-11: 4 [IU] via SUBCUTANEOUS
  Administered 2019-12-11 – 2019-12-12 (×2): 3 [IU] via SUBCUTANEOUS
  Administered 2019-12-12: 4 [IU] via SUBCUTANEOUS
  Administered 2019-12-12: 3 [IU] via SUBCUTANEOUS
  Administered 2019-12-12: 4 [IU] via SUBCUTANEOUS
  Administered 2019-12-12: 3 [IU] via SUBCUTANEOUS
  Administered 2019-12-12 – 2019-12-13 (×3): 4 [IU] via SUBCUTANEOUS
  Administered 2019-12-13 (×2): 3 [IU] via SUBCUTANEOUS
  Administered 2019-12-13: 4 [IU] via SUBCUTANEOUS
  Administered 2019-12-14: 3 [IU] via SUBCUTANEOUS
  Administered 2019-12-14 (×5): 4 [IU] via SUBCUTANEOUS
  Administered 2019-12-15 (×3): 3 [IU] via SUBCUTANEOUS
  Administered 2019-12-15 (×2): 4 [IU] via SUBCUTANEOUS
  Administered 2019-12-15 – 2019-12-16 (×2): 3 [IU] via SUBCUTANEOUS
  Administered 2019-12-16: 4 [IU] via SUBCUTANEOUS
  Administered 2019-12-16 – 2019-12-17 (×8): 3 [IU] via SUBCUTANEOUS
  Administered 2019-12-18 – 2019-12-19 (×10): 4 [IU] via SUBCUTANEOUS
  Administered 2019-12-19 (×2): 3 [IU] via SUBCUTANEOUS
  Administered 2019-12-20: 7 [IU] via SUBCUTANEOUS
  Administered 2019-12-20 (×2): 4 [IU] via SUBCUTANEOUS
  Administered 2019-12-20: 3 [IU] via SUBCUTANEOUS
  Administered 2019-12-20 – 2019-12-21 (×7): 4 [IU] via SUBCUTANEOUS
  Administered 2019-12-22 (×2): 7 [IU] via SUBCUTANEOUS
  Administered 2019-12-22: 4 [IU] via SUBCUTANEOUS
  Administered 2019-12-22: 7 [IU] via SUBCUTANEOUS
  Administered 2019-12-22 – 2019-12-24 (×8): 4 [IU] via SUBCUTANEOUS
  Administered 2019-12-24: 7 [IU] via SUBCUTANEOUS
  Administered 2019-12-24: 4 [IU] via SUBCUTANEOUS
  Administered 2019-12-24 (×2): 3 [IU] via SUBCUTANEOUS
  Administered 2019-12-24 – 2019-12-25 (×2): 4 [IU] via SUBCUTANEOUS
  Administered 2019-12-25: 3 [IU] via SUBCUTANEOUS
  Administered 2019-12-25: 4 [IU] via SUBCUTANEOUS
  Administered 2019-12-25: 3 [IU] via SUBCUTANEOUS
  Administered 2019-12-25: 4 [IU] via SUBCUTANEOUS
  Administered 2019-12-25: 3 [IU] via SUBCUTANEOUS
  Administered 2019-12-26: 4 [IU] via SUBCUTANEOUS
  Administered 2019-12-26 (×2): 3 [IU] via SUBCUTANEOUS
  Administered 2019-12-26: 4 [IU] via SUBCUTANEOUS
  Administered 2019-12-26: 3 [IU] via SUBCUTANEOUS
  Administered 2019-12-26: 4 [IU] via SUBCUTANEOUS
  Administered 2019-12-26 – 2019-12-29 (×13): 3 [IU] via SUBCUTANEOUS
  Administered 2019-12-30: 4 [IU] via SUBCUTANEOUS
  Administered 2019-12-30 – 2020-01-02 (×17): 3 [IU] via SUBCUTANEOUS
  Administered 2020-01-02: 4 [IU] via SUBCUTANEOUS
  Administered 2020-01-02: 3 [IU] via SUBCUTANEOUS
  Administered 2020-01-03: 4 [IU] via SUBCUTANEOUS
  Administered 2020-01-03 (×3): 3 [IU] via SUBCUTANEOUS
  Administered 2020-01-04 (×2): 4 [IU] via SUBCUTANEOUS
  Administered 2020-01-04 (×3): 3 [IU] via SUBCUTANEOUS
  Administered 2020-01-05: 4 [IU] via SUBCUTANEOUS
  Administered 2020-01-05 (×3): 3 [IU] via SUBCUTANEOUS
  Administered 2020-01-05: 4 [IU] via SUBCUTANEOUS
  Administered 2020-01-05 (×2): 3 [IU] via SUBCUTANEOUS
  Administered 2020-01-06: 4 [IU] via SUBCUTANEOUS
  Administered 2020-01-06 – 2020-01-07 (×6): 3 [IU] via SUBCUTANEOUS
  Administered 2020-01-07 (×2): 4 [IU] via SUBCUTANEOUS
  Administered 2020-01-07 (×2): 3 [IU] via SUBCUTANEOUS
  Administered 2020-01-08: 4 [IU] via SUBCUTANEOUS
  Administered 2020-01-08 – 2020-01-09 (×4): 3 [IU] via SUBCUTANEOUS
  Administered 2020-01-09: 4 [IU] via SUBCUTANEOUS
  Administered 2020-01-09 (×2): 3 [IU] via SUBCUTANEOUS
  Administered 2020-01-09 (×2): 4 [IU] via SUBCUTANEOUS
  Administered 2020-01-10 – 2020-01-11 (×4): 3 [IU] via SUBCUTANEOUS

## 2019-12-04 MED ORDER — ORAL CARE MOUTH RINSE
15.0000 mL | OROMUCOSAL | Status: DC
  Administered 2019-12-04 – 2020-01-01 (×268): 15 mL via OROMUCOSAL

## 2019-12-04 MED ORDER — FENTANYL BOLUS VIA INFUSION
50.0000 ug | INTRAVENOUS | Status: DC | PRN
  Filled 2019-12-04: qty 50

## 2019-12-04 MED ORDER — MIDAZOLAM HCL 2 MG/2ML IJ SOLN
2.0000 mg | INTRAMUSCULAR | Status: DC | PRN
  Administered 2019-12-04: 2 mg via INTRAVENOUS
  Filled 2019-12-04: qty 2

## 2019-12-04 MED ORDER — POLYETHYLENE GLYCOL 3350 17 G PO PACK: 17.0000 g | PACK | Freq: Every day | ORAL | Status: DC | PRN

## 2019-12-04 MED ORDER — SODIUM CHLORIDE 0.9% FLUSH: 3.0000 mL | Freq: Once | INTRAVENOUS | 0 refills | Status: DC

## 2019-12-04 MED ORDER — MIDAZOLAM HCL 2 MG/2ML IJ SOLN: 4.0000 mg | Freq: Once | INTRAMUSCULAR | Status: AC

## 2019-12-04 MED ORDER — FUROSEMIDE 10 MG/ML IJ SOLN
40.0000 mg | Freq: Once | INTRAMUSCULAR | Status: AC
  Administered 2019-12-04: 40 mg via INTRAVENOUS
  Filled 2019-12-04: qty 4

## 2019-12-04 MED ORDER — DOCUSATE SODIUM 50 MG/5ML PO LIQD: 100.0000 mg | Freq: Two times a day (BID) | ORAL | Status: DC

## 2019-12-04 MED ORDER — ASCORBIC ACID 500 MG PO TABS: 500.0000 mg | ORAL_TABLET | Freq: Every day | ORAL | Status: DC

## 2019-12-04 MED ORDER — ENOXAPARIN SODIUM 100 MG/ML ~~LOC~~ SOLN: 0.5000 mg/kg | SUBCUTANEOUS | Status: DC

## 2019-12-04 MED ORDER — ENOXAPARIN SODIUM 100 MG/ML ~~LOC~~ SOLN
0.5000 mg/kg | SUBCUTANEOUS | Status: DC
  Administered 2019-12-05 – 2019-12-07 (×3): 85 mg via SUBCUTANEOUS
  Filled 2019-12-04 (×3): qty 0.85

## 2019-12-04 MED ORDER — SODIUM CHLORIDE 0.9 % IV SOLN
100.0000 mg | Freq: Every day | INTRAVENOUS | Status: AC
  Administered 2019-12-05 – 2019-12-06 (×2): 100 mg via INTRAVENOUS
  Filled 2019-12-04 (×2): qty 20

## 2019-12-04 MED ORDER — ZINC SULFATE 220 (50 ZN) MG PO CAPS: 220.0000 mg | ORAL_CAPSULE | Freq: Every day | ORAL | Status: DC

## 2019-12-04 MED ORDER — INSULIN ASPART 100 UNIT/ML ~~LOC~~ SOLN: 0.0000 [IU] | SUBCUTANEOUS | 11 refills | Status: DC

## 2019-12-04 MED ORDER — FREE WATER
100.0000 mL | Freq: Four times a day (QID) | Status: DC
  Administered 2019-12-04 – 2019-12-09 (×19): 100 mL

## 2019-12-04 MED ORDER — POLYETHYLENE GLYCOL 3350 17 G PO PACK: 17.0000 g | PACK | Freq: Every day | ORAL | 0 refills | Status: DC

## 2019-12-04 MED ORDER — ORAL CARE MOUTH RINSE: 15.0000 mL | Freq: Every morning | OROMUCOSAL | 0 refills | Status: DC

## 2019-12-04 MED ORDER — MIDAZOLAM HCL 2 MG/2ML IJ SOLN
4.0000 mg | Freq: Once | INTRAMUSCULAR | Status: AC
  Administered 2019-12-04: 4 mg via INTRAVENOUS
  Filled 2019-12-04: qty 4

## 2019-12-04 MED ORDER — NOREPINEPHRINE 16 MG/250ML-% IV SOLN: 0.0000 ug/min | INTRAVENOUS | Status: DC

## 2019-12-04 MED ORDER — CHLORHEXIDINE GLUCONATE CLOTH 2 % EX PADS: 6.0000 | MEDICATED_PAD | Freq: Every day | CUTANEOUS | Status: DC

## 2019-12-04 MED ORDER — HYDROCOD POLST-CPM POLST ER 10-8 MG/5ML PO SUER: 5.0000 mL | Freq: Two times a day (BID) | ORAL | Status: DC | PRN

## 2019-12-04 MED ORDER — DOCUSATE SODIUM 50 MG/5ML PO LIQD
100.0000 mg | Freq: Two times a day (BID) | ORAL | Status: DC
  Administered 2019-12-04 – 2019-12-17 (×26): 100 mg
  Filled 2019-12-04 (×26): qty 10

## 2019-12-04 MED ORDER — MIDAZOLAM BOLUS VIA INFUSION
1.0000 mg | INTRAVENOUS | Status: DC | PRN
  Filled 2019-12-04: qty 2

## 2019-12-04 MED ORDER — CHLORHEXIDINE GLUCONATE 0.12% ORAL RINSE (MEDLINE KIT)
15.0000 mL | Freq: Two times a day (BID) | OROMUCOSAL | Status: DC
  Administered 2019-12-04 – 2020-01-06 (×67): 15 mL via OROMUCOSAL

## 2019-12-04 MED ORDER — FENTANYL 2500MCG IN NS 250ML (10MCG/ML) PREMIX INFUSION
0.0000 ug/h | INTRAVENOUS | Status: DC
  Administered 2019-12-04 – 2019-12-09 (×18): 400 ug/h via INTRAVENOUS
  Filled 2019-12-04 (×19): qty 250

## 2019-12-04 MED ORDER — FENTANYL BOLUS VIA INFUSION: 50.0000 ug | INTRAVENOUS | 0 refills | Status: DC | PRN

## 2019-12-04 MED ORDER — MIDAZOLAM 50MG/50ML (1MG/ML) PREMIX INFUSION
0.5000 mg/h | INTRAVENOUS | Status: DC
  Administered 2019-12-04: 5 mg/h via INTRAVENOUS
  Filled 2019-12-04: qty 50

## 2019-12-04 MED ORDER — ASCORBIC ACID 500 MG PO TABS
500.0000 mg | ORAL_TABLET | Freq: Every day | ORAL | Status: DC
  Administered 2019-12-05 – 2020-01-11 (×38): 500 mg
  Filled 2019-12-04 (×41): qty 1

## 2019-12-04 MED ORDER — LORAZEPAM 2 MG/ML IJ SOLN
4.0000 mg | Freq: Once | INTRAMUSCULAR | Status: AC
  Administered 2019-12-04: 4 mg via INTRAVENOUS
  Filled 2019-12-04: qty 2

## 2019-12-04 NOTE — H&P (Signed)
NAME:  Clifford Nichols, MRN:  [id number], DOB:  1980-09-07, LOS: 0 [facility] ADMISSION DATE:  12/04/2019, [name] Admission DATE:  12/04/19 REFERRING MD:  Dr Mortimer Fries, CCM, CHIEF COMPLAINT:  Acute respiratory failure   Brief History   39 year old obese man with hypertension, diabetes admitted with acute respiratory failure, ARDS from COVID-19 pneumonia.  History of present illness   Obtained from chart notes as patient is unable to give history 39 year old obese man with history of former tobacco, hypertension, diabetes, GERD not currently on medication.  Developed cough, dyspnea, fever, malaise and weakness about 4 days prior to this admission.  Was evaluated at [facility] and COVID-19 positive 5/10.  He was uncertain when/where he may have been exposed.  Chest x-ray revealed diffuse bilateral pulmonary infiltrates.  He was supported initially with high flow oxygen, BiPAP but devolved and required urgent ET intubation and mechanical ventilation 5/11.  Has received remdesivir, dexamethasone.  Transfers to [facility] for further care  Past Medical History   Past Medical History:  Diagnosis Date  . Abscess of upper back excluding scapular region   . Diabetes mellitus without complication ([place])   . GERD (gastroesophageal reflux disease)    OCC-NO MEDS  . Hypertension   . Sebaceous cyst     Significant Hospital Events   5/10 admitted [facility]  5/11 failed HHFNC, BiPAP.  Required intubation 5/12 transferred to [facility]  Consults:  Ophthalmology for proptosis 5/12  Procedures:  ETT 5/11 >>  L femoral CVC 5/11 >>   Significant Diagnostic Tests:  Head CT 5/11 >> no significant intracranial abnormality.  Scattered opacification and air-fluid levels in the frontal, ethmoid, sphenoid and maxillary sinuses of unclear significance. Head CT 5/12 >> no significant intracranial abnormality  Micro Data:  SARS CoV2 5/10 >> positive  Antimicrobials:  Remdesivir 5/11 >> Dexamethasone 5/11 >>  Interim history/subjective:     Objective   Pulse 74, resp. rate (!) 32, SpO2 96 %.    Vent Mode: PRVC FiO2 (%):  [80 %-100 %] 100 % Set Rate:  [20 bmp-32 bmp] 32 bmp Vt Set:  [380 mL-500 mL] 380 mL PEEP:  [5 [id number] cmH20] 12 cmH20 Plateau Pressure:  [26 cmH20] 26 cmH20  No intake or output data in the 24 hours ending 12/04/19 1415 There were no vitals filed for this visit.  Examination: General: Obese man, ill-appearing, intubated and sedated HENT: ET tube in good position.  No evidence left eye proptosis.  Gauze in place Lungs: Distant, coarse, no wheezing Cardiovascular: Regular, distant, no murmur Abdomen: Obese, nondistended, positive bowel sounds Extremities: no edema  Neuro: Sedated and unresponsive to voice or pain.  No grimace with stimulation GU: Foley catheter in place  Resolved Hospital Problem list     Assessment & Plan:   Acute hypoxemic respiratory failure due to COVID-19 pneumonia / ARDS Mechanical ventilation via ARDS protocol, target PRVC 6 cc/kg Wean PEEP and FiO2 as able Goal plateau pressure less than 30, driving pressure less than 15 Paralytics if necessary for vent synchrony, gas exchange Cycle prone positioning if necessary for oxygenation Deep sedation per PAD protocol, goal RASS -4, currently fentanyl, midazolam Diuresis as blood pressure and renal function can tolerate, goal CVP 5-8.  Not in a position for diuresis currently given renal insufficiency VAP prevention order set Remdesivir, plan for 5 days Dexamethasone, plan for 10 days Follow inflammatory markers: Ferritin, D-dimer, CRP, IL-6, LDH Assess for possible tocilizumab 5/12; CPR improving 13.3 > 6.3 Vitamin C, zinc Pct low 5/10. Plan to  repeat and check resp cx. Hold off on empiric abx for now.   Morbid obesity, possible OSA.  Will certainly impact respiratory mechanics, ventilator weaning Suspect will need to consider additional PEEP, possible extubation to BiPAP when appropriate to consider Will need  outpatient evaluation for OSA if/when he gets to acute events.  Acute toxic metabolic encephalopathy, need for sedation Goal RASS -3 to -4  Acute renal failure. Follow urine output, BMP Ensure adequate renal perfusion, optimize oxygenation Renal dose medications  Diabetes mellitus Sliding scale insulin per protocol  Left proptosis, noted with coughing early 5/12, resolved.  Apparently per wife he has episodic intermittent proptosis at home as well. Head CT 5/12 reassuring Supportive care.  No intervention at this time beyond keeping the eyes moist. Ophthalmology was called at [facility], did not see him prior to transfer  History of hypertension Not currently on a home regimen    Best practice:  Diet: assess for TF's 5/12 Pain/Anxiety/Delirium protocol (if indicated): 5/11 > fentanyl, midazolam VAP protocol (if indicated): 5/11 DVT prophylaxis: enoxaparin  GI prophylaxis: pepcid Glucose control: SSI protocol Mobility: BR Code Status: Full Family Communication:  Disposition: ICU  Labs   CBC: Recent Labs  Lab 12/02/19 1537 12/03/19 0058 12/04/19 0450  WBC 7.2 7.4 8.6  NEUTROABS  --  6.3 7.3  HGB 15.5 14.8 14.5  HCT 51.4 50.0 47.7  MCV 100.4* 102.0* 101.3*  PLT 137* 120* 144*    Basic Metabolic Panel: Recent Labs  Lab 12/02/19 1537 12/03/19 0058 12/04/19 0450  NA 136 137 139  K 3.8 4.5 5.0  CL 97* 98 99  CO2 27 29 27   GLUCOSE 154* 171* 234*  BUN 10 10 30*  CREATININE 1.13 1.02 2.11*  CALCIUM 8.3* 8.1* 8.1*   GFR: Estimated Creatinine Clearance: 73.3 mL/min (A) (by C-G formula based on SCr of 2.11 mg/dL (H)). Recent Labs  Lab 12/02/19 1537 12/03/19 0058 12/04/19 0450  PROCALCITON 0.14  --   --   WBC 7.2 7.4 8.6    Liver Function Tests: Recent Labs  Lab 12/03/19 0058 12/04/19 0450  AST 46* 39  ALT 27 23  ALKPHOS 47 39  BILITOT 0.8 1.2  PROT 8.1 7.8  ALBUMIN 3.2* 3.2*   No results for input(s): LIPASE, AMYLASE in the last 168 hours. No  results for input(s): AMMONIA in the last 168 hours.  ABG    Component Value Date/Time   PHART 7.28 (L) 12/04/2019 1147   PCO2ART 64 (H) 12/04/2019 1147   PO2ART 79 (L) 12/04/2019 1147   HCO3 30.1 (H) 12/04/2019 1147   TCO2 27 08/15/2009 1545   O2SAT 93.9 12/04/2019 1147     Coagulation Profile: No results for input(s): INR, PROTIME in the last 168 hours.  Cardiac Enzymes: No results for input(s): CKTOTAL, CKMB, CKMBINDEX, TROPONINI in the last 168 hours.  HbA1C: Hgb A1c MFr Bld  Date/Time Value Ref Range Status  12/03/2019 07:32 PM 6.6 (H) 4.8 - 5.6 % Final    Comment:    (NOTE) Pre diabetes:          5.7%-6.4% Diabetes:              >6.4% Glycemic control for   <7.0% adults with diabetes   04/29/2018 03:10 AM 6.0 (H) 4.8 - 5.6 % Final    Comment:    (NOTE) Pre diabetes:          5.7%-6.4% Diabetes:              >  6.4% Glycemic control for   <7.0% adults with diabetes     CBG: Recent Labs  Lab 12/03/19 1910 12/03/19 2249 12/04/19 0336 12/04/19 0725 12/04/19 1154  GLUCAP 179* 209* 209* 233* 233*    Review of Systems:   Unable to obtain  Past Medical History  He,  has a past medical history of Abscess of upper back excluding scapular region, Diabetes mellitus without complication ([place]), GERD (gastroesophageal reflux disease), Hypertension, and Sebaceous cyst.   Surgical History    Past Surgical History:  Procedure Laterality Date  . IRRIGATION AND DEBRIDEMENT SEBACEOUS CYST Right 07/11/2018   Procedure: EXCISION  SEBACEOUS CYST POSTERIOR SHOULDER AND MID-BACK;  Surgeon: [name], MD;  Location: [facility] ORS;  Service: General;  Laterality: Right;  . NO PAST SURGERIES       Social History   reports that he quit smoking about 23 months ago. His smoking use included cigarettes. He smoked 0.50 packs per day. He has never used smokeless tobacco. He reports previous alcohol use. He reports that he does not use drugs.   Family History   His family  history includes Hypertension in his father.   Allergies No Known Allergies   Home Medications  Prior to Admission medications   Medication Sig Start Date End Date Taking? Authorizing Provider  ascorbic acid (VITAMIN C) 500 MG tablet Take 1 tablet (500 mg total) by mouth daily. 12/04/19   Flora Lipps, MD  Chlorhexidine Gluconate Cloth 2 % PADS Apply 6 each topically daily. 12/04/19   Flora Lipps, MD  chlorhexidine gluconate, MEDLINE KIT, (PERIDEX) 0.12 % solution 15 mLs by Mouth Rinse route 2 (two) times daily. 12/04/19   Flora Lipps, MD  chlorpheniramine-HYDROcodone (TUSSIONEX) 10-8 MG/5ML SUER Take 5 mLs by mouth every 12 (twelve) hours as needed for cough. 12/04/19   Flora Lipps, MD  dexamethasone (DECADRON) 10 MG/ML injection Inject 0.6 mLs (6 mg total) into the vein daily. 12/05/19   Flora Lipps, MD  dexmedetomidine (PRECEDEX) 400 MCG/100ML SOLN Inject 90.72-272.16 mcg/hr into the vein continuous. 12/04/19   Flora Lipps, MD  docusate (COLACE) 50 MG/5ML liquid Take 10 mLs (100 mg total) by mouth 2 (two) times daily. 12/04/19   Flora Lipps, MD  enoxaparin (LOVENOX) 120 MG/0.8ML injection Inject 0.76 mLs (115 mg total) into the skin daily. 12/04/19   Flora Lipps, MD  famotidine (PEPCID) 20-0.9 MG/50ML-% Inject 50 mLs (20 mg total) into the vein daily. 12/04/19   Flora Lipps, MD  fentaNYL (SUBLIMAZE) SOLN Inject 50 mcg into the vein every 15 (fifteen) minutes as needed (to maintain RASS & CPOT goal.). 12/04/19   Flora Lipps, MD  fentaNYL 10 mcg/ml SOLN infusion Inject 0-400 mcg/hr into the vein continuous. 12/04/19   Flora Lipps, MD  guaiFENesin-dextromethorphan (ROBITUSSIN DM) 100-10 MG/5ML syrup Take 10 mLs by mouth every 4 (four) hours as needed for cough. 12/04/19   Flora Lipps, MD  insulin aspart (NOVOLOG) 100 UNIT/ML injection Inject 0-20 Units into the skin every 4 (four) hours. 12/04/19   Flora Lipps, MD  Ipratropium-Albuterol (COMBIVENT) 20-100 MCG/ACT AERS respimat Inhale 1 puff into  the lungs every 6 (six) hours. 12/04/19   Flora Lipps, MD  ivermectin (STROMECTOL) 3 MG TABS tablet Place 6.5 tablets (19,500 mcg total) into feeding tube every evening. 12/04/19   Flora Lipps, MD  midazolam (VERSED) 2 MG/2ML SOLN injection Inject 2 mLs (2 mg total) into the vein every 15 (fifteen) minutes as needed (to acheive RASS goal.). 12/04/19   Flora Lipps, MD  midazolam (VERSED) 2 MG/2ML SOLN injection Inject 2 mLs (2 mg total) into the vein every 2 (two) hours as needed (to maintain RASS goal.). 12/04/19   Flora Lipps, MD  mouth rinse LIQD solution 15 mLs by Mouth Rinse route every morning. 12/04/19   Flora Lipps, MD  norepinephrine (LEVOPHED) 16-5 MG/250ML-% SOLN Inject 0-40 mcg/min into the vein continuous. 12/04/19   Flora Lipps, MD  norepinephrine (LEVOPHED) 4-5 MG/250ML-% SOLN Inject 0-40 mcg/min into the vein continuous. 12/04/19   Flora Lipps, MD  ondansetron (ZOFRAN) 4 MG tablet Take 1 tablet (4 mg total) by mouth every 6 (six) hours as needed for nausea. 12/04/19   Flora Lipps, MD  polyethylene glycol (MIRALAX / GLYCOLAX) 17 g packet Take 17 g by mouth daily. 12/04/19   Flora Lipps, MD  propofol (DIPRIVAN) 1000 MG/100ML EMUL injection Inject 0-11,340 mcg/min into the vein continuous. 12/04/19   Flora Lipps, MD  sodium chloride flush (NS) 0.9 % SOLN Inject 3 mLs into the vein once for 1 dose. 12/04/19 12/04/19  Flora Lipps, MD  vecuronium (NORCURON) 10 MG injection Inject 10 mLs (10 mg total) into the vein every hour as needed (vent asynchrony). 12/04/19   Flora Lipps, MD  zinc sulfate 220 (50 Zn) MG capsule Take 1 capsule (220 mg total) by mouth daily. 12/04/19   Flora Lipps, MD     Critical care time: 88 min     Baltazar Apo, MD, PhD 12/04/2019, 2:35 PM [facility] [phone] or if no answer [phone]

## 2019-12-04 NOTE — Progress Notes (Signed)
Repeat Head CT is normal.  Have not received callback from Opthalmology.  Will page again.     Harlon Ditty, AGACNP-BC [facility] Pager: [phone]

## 2019-12-04 NOTE — Progress Notes (Signed)
eLink Physician-Brief Progress Note Patient Name: Clifford Nichols DOB: Dec 09, 1980 MRN: [id number]   Date of Service  12/04/2019  HPI/Events of Note  Patient has Foley catheter, however, no order for Foley catheter.   eICU Interventions  Will order: 1. Place Foley catheter.      Intervention Category Major Interventions: Other:  [name] 12/04/2019, 11:53 PM

## 2019-12-04 NOTE — Progress Notes (Signed)
Pt's proptosis has resolved, left eye is now symmetrical to right eye.   Saline soaked gauze left in place.  When eyelids are lifted, both eyes do slightly protrude.  Continue to assess.  Discussed with Ophthalmology Dr. Inez Pilgrim.  Given that proptosis has  resolved, he will come to bedside later this morning.   Nursing spoke with pt's wife who reports that pt frequently has episodes of proptosis at home.  Harlon Ditty, AGACNP-BC [facility] Pager: [phone]

## 2019-12-04 NOTE — Progress Notes (Signed)
Recruitment maneuver performed per MD request due to low sao2 on ABG. Pt unable to tolerate full 2 minutes due to coughing. VS within normal limits. Pt [facility]

## 2019-12-04 NOTE — Progress Notes (Signed)
Sputum sample sent to lab per MD order 

## 2019-12-04 NOTE — Progress Notes (Signed)
eLink Physician-Brief Progress Note Patient Name: Clifford Nichols DOB: 1981/02/25 MRN: [id number]   Date of Service  12/04/2019  HPI/Events of Note  ABG on 100%/PRVC 34/TV 380/P 20 = 7.238/77.1/64.0/35. No room to push up PRVC FiO2/rate/TV or PEEP. Nursing has already had to push one eye back in its orbit. Therefore, given eye issue and his weight, I am reluctant to prone him at this time. Will tolerate permissive hypercapnea for present.   eICU Interventions  Will order: 1. Lasix 40 mg IV X 1 now.      Intervention Category Major Interventions: Hypoxemia - evaluation and management;Respiratory failure - evaluation and management  [name] 12/04/2019, 8:59 PM

## 2019-12-04 NOTE — Progress Notes (Signed)
Received pt from [facility] via [facility] on ventilator. Pt stable throughout with no complications. Vent Settings adjusteed to reflect ARDS protocol. ABG to be drawn in 1 hour.

## 2019-12-04 NOTE — Progress Notes (Signed)
Attemptiing assisted tele visit to patient with wife, Jaci Carrel.  Vena Austria, RN

## 2019-12-04 NOTE — H&P (Signed)
NAME:  Clifford Nichols, MRN:  [id number], DOB:  1980-09-20, LOS: 2 [facility] ADMISSION DATE:  12/02/2019, [name] Admission DATE:  12/04/19 REFERRING MD:  Dr Mortimer Fries, CCM, CHIEF COMPLAINT:  Acute respiratory failure   Brief History   39 year old obese man with hypertension, diabetes admitted with acute respiratory failure, ARDS from COVID-19 pneumonia.  History of present illness   Obtained from chart notes as patient is unable to give history 39 year old obese man with history of former tobacco, hypertension, diabetes, GERD not currently on medication.  Developed cough, dyspnea, fever, malaise and weakness about 4 days prior to this admission.  Was evaluated at [facility] and COVID-19 positive 5/10.  He was uncertain when/where he may have been exposed.  Chest x-ray revealed diffuse bilateral pulmonary infiltrates.  He was supported initially with high flow oxygen, BiPAP but devolved and required urgent ET intubation and mechanical ventilation 5/11.  Has received remdesivir, dexamethasone.  Transfers to [facility] for further care  Past Medical History   Past Medical History:  Diagnosis Date   Abscess of upper back excluding scapular region    Diabetes mellitus without complication ([place])    GERD (gastroesophageal reflux disease)    OCC-NO MEDS   Hypertension    Sebaceous cyst     Significant Hospital Events   5/10 admitted [facility]  5/11 failed HHFNC, BiPAP.  Required intubation 5/12 transferred to [facility]  Consults:  Ophthalmology for proptosis 5/12  Procedures:  ETT 5/11 >>  L femoral CVC 5/11 >>   Significant Diagnostic Tests:  Head CT 5/11 >> no significant intracranial abnormality.  Scattered opacification and air-fluid levels in the frontal, ethmoid, sphenoid and maxillary sinuses of unclear significance. Head CT 5/12 >> no significant intracranial abnormality  Micro Data:  SARS CoV2 5/10 >> positive  Antimicrobials:  Remdesivir 5/11 >> Dexamethasone 5/11 >>  Interim history/subjective:     Objective   Blood pressure 134/64, pulse 81, temperature (!) 97.3 F (36.3 C), temperature source Axillary, resp. rate (!) 24, height 5' 6"  (1.676 m), weight (!) 226.8 kg, SpO2 99 %.    Vent Mode: PRVC FiO2 (%):  [90 %-100 %] 90 % Set Rate:  [20 bmp-24 bmp] 24 bmp Vt Set:  [450 mL-500 mL] 500 mL PEEP:  [5 cmH20-10 cmH20] 8 cmH20 Plateau Pressure:  [26 cmH20] 26 cmH20   Intake/Output Summary (Last 24 hours) at 12/04/2019 1102 Last data filed at 12/04/2019 1000 Gross per 24 hour  Intake 1652.9 ml  Output 845 ml  Net 807.9 ml   Filed Weights   12/02/19 1523  Weight: (!) 226.8 kg     Resolved Hospital Problem list     Assessment & Plan:   Acute hypoxemic respiratory failure due to COVID-19 pneumonia / ARDS Mechanical ventilation via ARDS protocol, target PRVC 6 cc/kg Wean PEEP and FiO2 as able Goal plateau pressure less than 30, driving pressure less than 15 Paralytics if necessary for vent synchrony, gas exchange Cycle prone positioning if necessary for oxygenation Deep sedation per PAD protocol, goal RASS -4,  Diuresis as blood pressure and renal function can tolerate, goal CVP 5-8 VAP prevention order set Remdesivir, plan for 5 days Dexamethasone, plan for 10 days Follow inflammatory markers: Ferritin, D-dimer, CRP, IL-6, LDH Assess for possible tocilizumab 5/12; CPR improving 13.3 > 6.3 Vitamin C, zinc Pct low 5/10. Plan to repeat and check resp cx. Hold off on empiric abx for now.   Morbid obesity, possible OSA.  Will certainly impact respiratory mechanics, ventilator weaning Suspect will  need to consider additional PEEP, possible extubation to BiPAP when appropriate to consider Will need outpatient evaluation for OSA if/when he gets to acute events.  Acute toxic metabolic encephalopathy, need for sedation Goal RASS -3 to -4  Diabetes mellitus Sliding scale insulin per protocol  Left proptosis, noted with coughing early 5/12, resolved.  Apparently per wife  he has episodic intermittent proptosis at home as well. Head CT 5/12 reassuring Supportive care.  No intervention at this time beyond keeping the eyes moist.  Ophthalmology was called at [facility], unclear whether he was seen prior to transfer.  History of hypertension Not currently on a home regimen    Best practice:  Diet: assess for TF's 5/12 Pain/Anxiety/Delirium protocol (if indicated): 5/11 >  VAP protocol (if indicated): 5/11 DVT prophylaxis: enoxaparin  GI prophylaxis: pepcid Glucose control: SSI protocol Mobility: BR Code Status: Full Family Communication:  Disposition: ICU  Labs   CBC: Recent Labs  Lab 12/02/19 1537 12/03/19 0058 12/04/19 0450  WBC 7.2 7.4 8.6  NEUTROABS  --  6.3 7.3  HGB 15.5 14.8 14.5  HCT 51.4 50.0 47.7  MCV 100.4* 102.0* 101.3*  PLT 137* 120* 144*    Basic Metabolic Panel: Recent Labs  Lab 12/02/19 1537 12/03/19 0058 12/04/19 0450  NA 136 137 139  K 3.8 4.5 5.0  CL 97* 98 99  CO2 27 29 27   GLUCOSE 154* 171* 234*  BUN 10 10 30*  CREATININE 1.13 1.02 2.11*  CALCIUM 8.3* 8.1* 8.1*   GFR: Estimated Creatinine Clearance: 88.3 mL/min (A) (by C-G formula based on SCr of 2.11 mg/dL (H)). Recent Labs  Lab 12/02/19 1537 12/03/19 0058 12/04/19 0450  PROCALCITON 0.14  --   --   WBC 7.2 7.4 8.6    Liver Function Tests: Recent Labs  Lab 12/03/19 0058 12/04/19 0450  AST 46* 39  ALT 27 23  ALKPHOS 47 39  BILITOT 0.8 1.2  PROT 8.1 7.8  ALBUMIN 3.2* 3.2*   No results for input(s): LIPASE, AMYLASE in the last 168 hours. No results for input(s): AMMONIA in the last 168 hours.  ABG    Component Value Date/Time   PHART 7.27 (L) 12/04/2019 0500   PCO2ART 64 (H) 12/04/2019 0500   PO2ART 75 (L) 12/04/2019 0500   HCO3 29.4 (H) 12/04/2019 0500   TCO2 27 08/15/2009 1545   O2SAT 92.7 12/04/2019 0500     Coagulation Profile: No results for input(s): INR, PROTIME in the last 168 hours.  Cardiac Enzymes: No results for  input(s): CKTOTAL, CKMB, CKMBINDEX, TROPONINI in the last 168 hours.  HbA1C: Hgb A1c MFr Bld  Date/Time Value Ref Range Status  12/03/2019 07:32 PM 6.6 (H) 4.8 - 5.6 % Final    Comment:    (NOTE) Pre diabetes:          5.7%-6.4% Diabetes:              >6.4% Glycemic control for   <7.0% adults with diabetes   04/29/2018 03:10 AM 6.0 (H) 4.8 - 5.6 % Final    Comment:    (NOTE) Pre diabetes:          5.7%-6.4% Diabetes:              >6.4% Glycemic control for   <7.0% adults with diabetes     CBG: Recent Labs  Lab 12/03/19 1910 12/03/19 2249 12/04/19 0336 12/04/19 0725  GLUCAP 179* 209* 209* 233*    Review of Systems:   Unable to obtain  Past Medical History  He,  has a past medical history of Abscess of upper back excluding scapular region, Diabetes mellitus without complication ([place]), GERD (gastroesophageal reflux disease), Hypertension, and Sebaceous cyst.   Surgical History    Past Surgical History:  Procedure Laterality Date   IRRIGATION AND DEBRIDEMENT SEBACEOUS CYST Right 07/11/2018   Procedure: EXCISION  SEBACEOUS CYST POSTERIOR SHOULDER AND MID-BACK;  Surgeon: [name], MD;  Location: [facility] ORS;  Service: General;  Laterality: Right;   NO PAST SURGERIES       Social History   reports that he quit smoking about 23 months ago. His smoking use included cigarettes. He smoked 0.50 packs per day. He has never used smokeless tobacco. He reports previous alcohol use. He reports that he does not use drugs.   Family History   His family history includes Hypertension in his father.   Allergies No Known Allergies   Home Medications  Prior to Admission medications   Medication Sig Start Date End Date Taking? Authorizing Provider  ascorbic acid (VITAMIN C) 500 MG tablet Take 1 tablet (500 mg total) by mouth daily. 12/04/19   Flora Lipps, MD  Chlorhexidine Gluconate Cloth 2 % PADS Apply 6 each topically daily. 12/04/19   Flora Lipps, MD  chlorhexidine  gluconate, MEDLINE KIT, (PERIDEX) 0.12 % solution 15 mLs by Mouth Rinse route 2 (two) times daily. 12/04/19   Flora Lipps, MD  chlorpheniramine-HYDROcodone (TUSSIONEX) 10-8 MG/5ML SUER Take 5 mLs by mouth every 12 (twelve) hours as needed for cough. 12/04/19   Flora Lipps, MD  dexamethasone (DECADRON) 10 MG/ML injection Inject 0.6 mLs (6 mg total) into the vein daily. 12/05/19   Flora Lipps, MD  dexmedetomidine (PRECEDEX) 400 MCG/100ML SOLN Inject 90.72-272.16 mcg/hr into the vein continuous. 12/04/19   Flora Lipps, MD  docusate (COLACE) 50 MG/5ML liquid Take 10 mLs (100 mg total) by mouth 2 (two) times daily. 12/04/19   Flora Lipps, MD  enoxaparin (LOVENOX) 120 MG/0.8ML injection Inject 0.76 mLs (115 mg total) into the skin daily. 12/04/19   Flora Lipps, MD  famotidine (PEPCID) 20-0.9 MG/50ML-% Inject 50 mLs (20 mg total) into the vein daily. 12/04/19   Flora Lipps, MD  fentaNYL (SUBLIMAZE) SOLN Inject 50 mcg into the vein every 15 (fifteen) minutes as needed (to maintain RASS & CPOT goal.). 12/04/19   Flora Lipps, MD  fentaNYL 10 mcg/ml SOLN infusion Inject 0-400 mcg/hr into the vein continuous. 12/04/19   Flora Lipps, MD  guaiFENesin-dextromethorphan (ROBITUSSIN DM) 100-10 MG/5ML syrup Take 10 mLs by mouth every 4 (four) hours as needed for cough. 12/04/19   Flora Lipps, MD  insulin aspart (NOVOLOG) 100 UNIT/ML injection Inject 0-20 Units into the skin every 4 (four) hours. 12/04/19   Flora Lipps, MD  Ipratropium-Albuterol (COMBIVENT) 20-100 MCG/ACT AERS respimat Inhale 1 puff into the lungs every 6 (six) hours. 12/04/19   Flora Lipps, MD  ivermectin (STROMECTOL) 3 MG TABS tablet Place 6.5 tablets (19,500 mcg total) into feeding tube every evening. 12/04/19   Flora Lipps, MD  midazolam (VERSED) 2 MG/2ML SOLN injection Inject 2 mLs (2 mg total) into the vein every 15 (fifteen) minutes as needed (to acheive RASS goal.). 12/04/19   Flora Lipps, MD  midazolam (VERSED) 2 MG/2ML SOLN injection Inject 2  mLs (2 mg total) into the vein every 2 (two) hours as needed (to maintain RASS goal.). 12/04/19   Flora Lipps, MD  mouth rinse LIQD solution 15 mLs by Mouth Rinse route every morning. 12/04/19  Flora Lipps, MD  norepinephrine (LEVOPHED) 16-5 MG/250ML-% SOLN Inject 0-40 mcg/min into the vein continuous. 12/04/19   Flora Lipps, MD  norepinephrine (LEVOPHED) 4-5 MG/250ML-% SOLN Inject 0-40 mcg/min into the vein continuous. 12/04/19   Flora Lipps, MD  ondansetron (ZOFRAN) 4 MG tablet Take 1 tablet (4 mg total) by mouth every 6 (six) hours as needed for nausea. 12/04/19   Flora Lipps, MD  polyethylene glycol (MIRALAX / GLYCOLAX) 17 g packet Take 17 g by mouth daily. 12/04/19   Flora Lipps, MD  propofol (DIPRIVAN) 1000 MG/100ML EMUL injection Inject 0-11,340 mcg/min into the vein continuous. 12/04/19   Flora Lipps, MD  sodium chloride flush (NS) 0.9 % SOLN Inject 3 mLs into the vein once for 1 dose. 12/04/19 12/04/19  Flora Lipps, MD  vecuronium (NORCURON) 10 MG injection Inject 10 mLs (10 mg total) into the vein every hour as needed (vent asynchrony). 12/04/19   Flora Lipps, MD  zinc sulfate 220 (50 Zn) MG capsule Take 1 capsule (220 mg total) by mouth daily. 12/04/19   Flora Lipps, MD     Critical care time:

## 2019-12-04 NOTE — Significant Event (Signed)
Called to bedside by nursing as pt with Proptosis of Left eye following coughing episode.  Called and discussed with Dr. Belia Heman.  Will sedate pt heavily and paralyze him.  Will obtain STAT CT Head to assess for ICP, keep left eye moist with saline soaked gauze, and will consult Ophthalmology urgently.  Have sent out page to Ophthalmology, awaiting callback.      In reviewing pt's records, pt had episode of bilateral eye Proptosis in ED.    Harlon Ditty, AGACNP-BC [facility] Pager: [phone]

## 2019-12-04 NOTE — Discharge Summary (Signed)
Physician Discharge Summary  Patient ID: Clifford Nichols MRN: [id number] DOB/AGE: 25-Jun-1981 39 y.o.  Admit date: 12/02/2019 Discharge date: 12/04/2019  Admission Diagnoses: ARDS COVID 19 PNEUMONIA  Discharge Diagnoses:  Principal Problem:   Acute respiratory failure due to COVID-19 [name]) Active Problems:   HTN (hypertension)   Hyperglycemia   Diabetes ([place])   COVID-19   Discharged Condition: STABLE Hospital Course:  39 yo obese AAM with COVID 19 PNEUMONIA -medical history significant ofhypertension, diabetes, GERD not on any medication at the moment presenting with congestion shortness of breath or cough.   Symptoms started about 3 days ago. Associated with sputum. Patient also has fever and weakness. He has felt weakness.   He was seen in the ER and evaluated.   COVID-19 screening positive. He is hypoxic and has bilateral infiltrates. Patient being admitted with acute respiratory failure due to COVID-19 pneumonia.Could not remember where he contracted the virus.  ED Course:Temperature 103.2 blood pressure 110/78 pulse was 62 respirate 20 oxygen sat 72% on room air currently 86% on 2 L. White count 7.2 hemoglobin 15.5 platelets 137. Chloride 97 calcium 8.3. Glucose 154. Chest x-ray showed multifocal infiltrates. Patient will be admitted for treatment of COVID-19 pneumonia. Patient with progressive resp failure, emergently intubated Severe COVID-19 infection, ARDS and pneumonia/pneumonitis Continue IV steroids  IV remdisivir proning as tolerated due to severe hypoxia   Maintain airborne and contact precautions  As needed bronchodilators (MDI) Vitamin C and zinc Antitussives High risk for  death   Significant Diagnostic Studies: CXR, CT HEAD  Treatments:  Severe COVID-19 infection, ARDS and pneumonia/pneumonitis Continue IV steroids  IV remdisivir proning as tolerated due to severe hypoxia   Maintain airborne and contact precautions  As needed  bronchodilators (MDI) Vitamin C and zinc Antitussives PARALLYTICS SEDATION VENT SUPPORT   Discharge Exam: Blood pressure 105/72, pulse 91, temperature 97.7 F (36.5 C), temperature source Axillary, resp. rate (!) 25, height 5' 6"  (1.676 m), weight (!) 226.8 kg, SpO2 98 %.   PHYSICAL EXAMINATION:  GENERAL:critically ill appearing, +resp distress HEAD: Normocephalic, atraumatic.  EYES: PROPTOSIS MOUTH: Moist mucosal membrane. NECK: Supple. No thyromegaly. No nodules. No JVD.  PULMONARY: +rhonchi, +wheezing CARDIOVASCULAR: S1 and S2. Regular rate and rhythm. No murmurs, rubs, or gallops.  GASTROINTESTINAL: Soft, nontender, -distended. Positive bowel sounds.  MUSCULOSKELETAL: No swelling, clubbing, or edema.  NEUROLOGIC: obtunded SKIN:intact,warm,dry    Disposition: [facility]SFER TO [facility]   Allergies as of 12/04/2019   No Known Allergies     Medication List    TAKE these medications   ascorbic acid 500 MG tablet Commonly known as: VITAMIN C Take 1 tablet (500 mg total) by mouth daily.   chlorhexidine gluconate (MEDLINE KIT) 0.12 % solution Commonly known as: PERIDEX 15 mLs by Mouth Rinse route 2 (two) times daily.   Chlorhexidine Gluconate Cloth 2 % Pads Apply 6 each topically daily.   chlorpheniramine-HYDROcodone 10-8 MG/5ML Suer Commonly known as: TUSSIONEX Take 5 mLs by mouth every 12 (twelve) hours as needed for cough.   dexamethasone 10 MG/ML injection Commonly known as: DECADRON Inject 0.6 mLs (6 mg total) into the vein daily. Start taking on: Dec 05, 2019   dexmedetomidine 400 MCG/100ML Soln Commonly known as: PRECEDEX Inject 90.72-272.16 mcg/hr into the vein continuous.   docusate 50 MG/5ML liquid Commonly known as: COLACE Take 10 mLs (100 mg total) by mouth 2 (two) times daily.   enoxaparin 120 MG/0.8ML injection Commonly known as: LOVENOX Inject 0.76 mLs (115 mg total) into the skin daily.  famotidine 20-0.9 MG/50ML-% Commonly known as:  PEPCID Inject 50 mLs (20 mg total) into the vein daily.   fentaNYL 10 mcg/ml Soln infusion Inject 0-400 mcg/hr into the vein continuous.   fentaNYL Soln Commonly known as: SUBLIMAZE Inject 50 mcg into the vein every 15 (fifteen) minutes as needed (to maintain RASS & CPOT goal.).   guaiFENesin-dextromethorphan 100-10 MG/5ML syrup Commonly known as: ROBITUSSIN DM Take 10 mLs by mouth every 4 (four) hours as needed for cough.   insulin aspart 100 UNIT/ML injection Commonly known as: novoLOG Inject 0-20 Units into the skin every 4 (four) hours.   Ipratropium-Albuterol 20-100 MCG/ACT Aers respimat Commonly known as: COMBIVENT Inhale 1 puff into the lungs every 6 (six) hours.   ivermectin 3 MG Tabs tablet Commonly known as: STROMECTOL Place 6.5 tablets (19,500 mcg total) into feeding tube every evening.   midazolam 2 MG/2ML Soln injection Commonly known as: VERSED Inject 2 mLs (2 mg total) into the vein every 15 (fifteen) minutes as needed (to acheive RASS goal.).   midazolam 2 MG/2ML Soln injection Commonly known as: VERSED Inject 2 mLs (2 mg total) into the vein every 2 (two) hours as needed (to maintain RASS goal.).   mouth rinse Liqd solution 15 mLs by Mouth Rinse route every morning.   norepinephrine 4-5 MG/250ML-% Soln Commonly known as: LEVOPHED Inject 0-40 mcg/min into the vein continuous.   norepinephrine 16-5 MG/250ML-% Soln Commonly known as: LEVOPHED Inject 0-40 mcg/min into the vein continuous.   ondansetron 4 MG tablet Commonly known as: ZOFRAN Take 1 tablet (4 mg total) by mouth every 6 (six) hours as needed for nausea.   polyethylene glycol 17 g packet Commonly known as: MIRALAX / GLYCOLAX Take 17 g by mouth daily.   propofol 1000 MG/100ML Emul injection Commonly known as: DIPRIVAN Inject 0-11,340 mcg/min into the vein continuous.   sodium chloride flush 0.9 % Soln Commonly known as: NS Inject 3 mLs into the vein once for 1 dose.   vecuronium 10  MG injection Commonly known as: NORCURON Inject 10 mLs (10 mg total) into the vein every hour as needed (vent asynchrony).   zinc sulfate 220 (50 Zn) MG capsule Take 1 capsule (220 mg total) by mouth daily.        Signed: Flora Lipps 12/04/2019, 7:47 AM

## 2019-12-04 NOTE — Progress Notes (Signed)
eLink Physician-Brief Progress Note Patient Name: TALLON GERTZ DOB: 05-Nov-1980 MRN: [id number]   Date of Service  12/04/2019  HPI/Events of Note  Nursing request for Foley catheter.   eICU Interventions  Will order: 1. Place Foley catheter.      Intervention Category Major Interventions: Other:  Sommer,Steven Dennard Nip 12/04/2019, 6:01 AM

## 2019-12-05 ENCOUNTER — Inpatient Hospital Stay (HOSPITAL_COMMUNITY): Payer: No Typology Code available for payment source

## 2019-12-05 LAB — POCT I-STAT 7, (LYTES, BLD GAS, ICA,H+H)
Acid-Base Excess: 4 mmol/L — ABNORMAL HIGH (ref 0.0–2.0)
Acid-Base Excess: 4 mmol/L — ABNORMAL HIGH (ref 0.0–2.0)
Bicarbonate: 33.1 mmol/L — ABNORMAL HIGH (ref 20.0–28.0)
Bicarbonate: 31.4 mmol/L — ABNORMAL HIGH (ref 20.0–28.0)
Calcium, Ion: 1.22 mmol/L (ref 1.15–1.40)
Calcium, Ion: 1.21 mmol/L (ref 1.15–1.40)
HCT: 43 % (ref 39.0–52.0)
HCT: 42 % (ref 39.0–52.0)
Hemoglobin: 14.6 g/dL (ref 13.0–17.0)
Hemoglobin: 14.3 g/dL (ref 13.0–17.0)
O2 Saturation: 96 %
O2 Saturation: 96 %
Patient temperature: 98.3
Potassium: 4.3 mmol/L (ref 3.5–5.1)
Potassium: 4.5 mmol/L (ref 3.5–5.1)
Sodium: 141 mmol/L (ref 135–145)
Sodium: 140 mmol/L (ref 135–145)
TCO2: 35 mmol/L — ABNORMAL HIGH (ref 22–32)
TCO2: 33 mmol/L — ABNORMAL HIGH (ref 22–32)
pCO2 arterial: 69.7 mmHg (ref 32.0–48.0)
pCO2 arterial: 58.7 mmHg — ABNORMAL HIGH (ref 32.0–48.0)
pH, Arterial: 7.284 — ABNORMAL LOW (ref 7.350–7.450)
pH, Arterial: 7.336 — ABNORMAL LOW (ref 7.350–7.450)
pO2, Arterial: 98 mmHg (ref 83.0–108.0)
pO2, Arterial: 92 mmHg (ref 83.0–108.0)

## 2019-12-05 LAB — CBC
HCT: 47.2 % (ref 39.0–52.0)
Hemoglobin: 13.2 g/dL (ref 13.0–17.0)
MCH: 29.6 pg (ref 26.0–34.0)
MCHC: 28 g/dL — ABNORMAL LOW (ref 30.0–36.0)
MCV: 105.8 fL — ABNORMAL HIGH (ref 80.0–100.0)
Platelets: 122 10*3/uL — ABNORMAL LOW (ref 150–400)
RBC: 4.46 MIL/uL (ref 4.22–5.81)
RDW: 12.3 % (ref 11.5–15.5)
WBC: 5.8 10*3/uL (ref 4.0–10.5)
nRBC: 0 % (ref 0.0–0.2)

## 2019-12-05 LAB — GLUCOSE, CAPILLARY
Glucose-Capillary: 110 mg/dL — ABNORMAL HIGH (ref 70–99)
Glucose-Capillary: 128 mg/dL — ABNORMAL HIGH (ref 70–99)
Glucose-Capillary: 131 mg/dL — ABNORMAL HIGH (ref 70–99)
Glucose-Capillary: 132 mg/dL — ABNORMAL HIGH (ref 70–99)
Glucose-Capillary: 152 mg/dL — ABNORMAL HIGH (ref 70–99)
Glucose-Capillary: 172 mg/dL — ABNORMAL HIGH (ref 70–99)

## 2019-12-05 LAB — BASIC METABOLIC PANEL
Anion gap: 11 (ref 5–15)
BUN: 43 mg/dL — ABNORMAL HIGH (ref 6–20)
CO2: 30 mmol/L (ref 22–32)
Calcium: 8.4 mg/dL — ABNORMAL LOW (ref 8.9–10.3)
Chloride: 102 mmol/L (ref 98–111)
Creatinine, Ser: 2.99 mg/dL — ABNORMAL HIGH (ref 0.61–1.24)
GFR calc Af Amer: 30 mL/min — ABNORMAL LOW (ref >60)
GFR calc non Af Amer: 26 mL/min — ABNORMAL LOW (ref >60)
Glucose, Bld: 115 mg/dL — ABNORMAL HIGH (ref 70–99)
Potassium: 4.4 mmol/L (ref 3.5–5.1)
Sodium: 143 mmol/L (ref 135–145)

## 2019-12-05 LAB — MAGNESIUM
Magnesium: 2.5 mg/dL — ABNORMAL HIGH (ref 1.7–2.4)
Magnesium: 2.5 mg/dL — ABNORMAL HIGH (ref 1.7–2.4)

## 2019-12-05 LAB — FERRITIN: Ferritin: 331 ng/mL (ref 24–336)

## 2019-12-05 LAB — C-REACTIVE PROTEIN: CRP: 4.3 mg/dL — ABNORMAL HIGH (ref <1.0)

## 2019-12-05 LAB — LACTATE DEHYDROGENASE: LDH: 272 U/L — ABNORMAL HIGH (ref 98–192)

## 2019-12-05 LAB — PROCALCITONIN: Procalcitonin: 0.37 ng/mL

## 2019-12-05 LAB — D-DIMER, QUANTITATIVE: D-Dimer, Quant: 0.79 ug/mL-FEU — ABNORMAL HIGH (ref 0.00–0.50)

## 2019-12-05 LAB — PHOSPHORUS: Phosphorus: 5.7 mg/dL — ABNORMAL HIGH (ref 2.5–4.6)

## 2019-12-05 MED ORDER — PRO-STAT SUGAR FREE PO LIQD
60.0000 mL | Freq: Three times a day (TID) | ORAL | Status: DC
  Administered 2019-12-05 – 2020-01-09 (×104): 60 mL
  Filled 2019-12-05 (×104): qty 60

## 2019-12-05 MED ORDER — FUROSEMIDE 10 MG/ML IJ SOLN
40.0000 mg | Freq: Once | INTRAMUSCULAR | Status: AC
  Administered 2019-12-05: 40 mg via INTRAVENOUS
  Filled 2019-12-05: qty 4

## 2019-12-05 MED ORDER — VITAL AF 1.2 CAL PO LIQD: 1000.0000 mL | ORAL | Status: DC

## 2019-12-05 MED ORDER — VITAL AF 1.2 CAL PO LIQD
1000.0000 mL | ORAL | Status: DC
  Administered 2019-12-05 – 2019-12-12 (×5): 1000 mL

## 2019-12-05 NOTE — Progress Notes (Signed)
eLink Physician-Brief Progress Note Patient Name: [name] DOB: [birth year] MRN: [id number]   Date of Service  12/05/2019  HPI/Events of Note  ABG on 100%/PRVC 34/TV 380/P 20 = 7.284/69.7/98.0/35. No room to push up PRVC FiO2/rate/TV or PEEP. Nursing has already had to push one eye back in its orbit. Therefore, given eye issue and his weight, I am reluctant to prone him at this time. ABG is actually slightly improved with diuresis. Will tolerate permissive hypercapnea for present.  eICU Interventions  Will order: 1. Lasix 40 mg IV now.      Intervention Category Major Interventions: Respiratory failure - evaluation and management;Acid-Base disturbance - evaluation and management  Reham Slabaugh [place] 12/05/2019, 6:38 AM

## 2019-12-05 NOTE — Progress Notes (Signed)
Initial Nutrition Assessment  DOCUMENTATION CODES:   Morbid obesity  INTERVENTION:   Initiate enteral nutrition therapy via OG tube:  Vital AF 1.2 @ 45 ml/hr (1080 ml/day)  60 ml Prostat TID  Provides: 1896 kcal, 171 grams protein, and 875 ml free water  100 ml free water every 6 hours  Total free water: 1275 ml    NUTRITION DIAGNOSIS:   Increased nutrient needs related to acute illness(COVID-19 PNA) as evidenced by estimated needs.  GOAL:   Provide needs based on ASPEN/SCCM guidelines  MONITOR:   Vent status, TF tolerance  REASON FOR ASSESSMENT:   Consult, Ventilator Enteral/tube feeding initiation and management  ASSESSMENT:   Pt with PMH of HTN, DM, and morbid obesity admitted with acute respiratory failure/ARDS from COVID-19 PNA.   Noted now with AKI and low UOP.   5/10 admit to [facility] 5/11 required urgent intubation 5/12 tx to [facility] 5/12; ophthalmology for proptosis. Per notes RN having to push eye back into orbit therefore unable to prone pt.   Patient is currently intubated on ventilator support MV: 11.9 L/min Temp (24hrs), Avg:98.9 F (37.2 C), Min:98.1 F (36.7 C), Max:99.4 F (37.4 C)  Medications reviewed and include: vitamin C, decadron, colace, SSI, miralax, zinc  remdesivir  Labs reviewed  OG tube in place; per xray reading tip in the stomach  UOP: 560 ml  NUTRITION - FOCUSED PHYSICAL EXAM:  Deferred  Diet Order:   Diet Order            Diet NPO time specified  Diet effective now              EDUCATION NEEDS:   No education needs have been identified at this time  Skin:  Skin Assessment: Reviewed RN Assessment  Last BM:  unknown  Height:   Ht Readings from Last 1 Encounters:  12/04/19 5\' 7"  (1.702 m)    Weight:   Wt Readings from Last 1 Encounters:  12/04/19 (!) 174 kg    Ideal Body Weight:  67.2 kg  BMI:  Body mass index is 60.08 kg/m.  Estimated Nutritional Needs:   Kcal:  02/03/20  Protein:  140-175  grams  Fluid:  2 L/day  [id number]., RD, LDN, CNSC See AMiON for contact information

## 2019-12-05 NOTE — Progress Notes (Signed)
NAME:  Clifford Nichols, MRN:  [id number], DOB:  [birth year], LOS: 1 [facility] ADMISSION DATE:  12/04/2019, Clifford Nichols Admission DATE:  12/04/19 REFERRING MD:  Dr Mortimer Fries, CCM, CHIEF COMPLAINT:  Acute respiratory failure   Brief History   39 year old obese man with hypertension, diabetes admitted with acute respiratory failure, ARDS from COVID-19 pneumonia.  History of present illness   Obtained from chart notes as patient is unable to give history 39 year old obese man with history of former tobacco, hypertension, diabetes, GERD not currently on medication.  Developed cough, dyspnea, fever, malaise and weakness about 4 days prior to this admission.  Was evaluated at [facility] and COVID-19 positive 5/10.  He was uncertain when/where he may have been exposed.  Chest x-ray revealed diffuse bilateral pulmonary infiltrates.  He was supported initially with high flow oxygen, BiPAP but devolved and required urgent ET intubation and mechanical ventilation 5/11.  Has received remdesivir, dexamethasone.  Transfers to [facility] for further care  Past Medical History   Past Medical History:  Diagnosis Date  . Abscess of upper back excluding scapular region   . Diabetes mellitus without complication ([place])   . GERD (gastroesophageal reflux disease)    OCC-NO MEDS  . Hypertension   . Sebaceous cyst     Significant Hospital Events   5/10 admitted [facility]  5/11 failed HHFNC, BiPAP.  Required intubation 5/12 transferred to [facility]  Consults:  Ophthalmology for proptosis 5/12  Procedures:  ETT 5/11 >>  L femoral CVC 5/11 >>   Significant Diagnostic Tests:  Head CT 5/11 >> no significant intracranial abnormality.  Scattered opacification and air-fluid levels in the frontal, ethmoid, sphenoid and maxillary sinuses of unclear significance. Head CT 5/12 >> no significant intracranial abnormality  Micro Data:  SARS CoV2 5/10 >> positive  Antimicrobials:  Remdesivir 5/11 >> Dexamethasone 5/11 >> Tocilizumab 5/12  Interim  history/subjective:  High PEEP and FiO2 requirement through the night Had continued issues with left proptosis, prone position deferred due to this Received Tocilizumab yesterday Progressive renal dysfunction   Objective   Blood pressure (!) 126/55, pulse 80, temperature 98.6 F (37 C), temperature source Axillary, resp. rate (!) 30, height 5\' 7"  (1.702 m), weight (!) 174 kg, SpO2 95 %.    Vent Mode: PRVC FiO2 (%):  [100 %] 100 % Set Rate:  [32 bmp-34 bmp] 34 bmp Vt Set:  [380 mL] 380 mL PEEP:  [12 cmH20-20 cmH20] 20 cmH20 Plateau Pressure:  [26 cmH20-33 cmH20] 32 cmH20   Intake/Output Summary (Last 24 hours) at 12/05/2019 1210 Last data filed at 12/05/2019 1057 Gross per 24 hour  Intake 910.15 ml  Output 1020 ml  Net -109.85 ml   Filed Weights   12/04/19 1420  Weight: (!) 174 kg    Examination: General: Morbidly obese man, sedated, ventilated HENT: ET tube is in good position, gauze over the left eye, mild proptosis noted Lungs: Very distant, coarse, no wheeze Cardiovascular: Regular, distant, no murmur Abdomen: Obese, nondistended, positive bowel sounds Extremities: No significant edema Neuro: Sedated.  He does grimace with stimulation, nonpurposeful upper extremity movement.  Does not follow commands GU: Foley catheter in place  Resolved Hospital Problem list     Assessment & Plan:   Acute hypoxemic respiratory failure due to COVID-19 pneumonia / ARDS Continue mechanical ventilation via ARDS protocol, target PRVC 6 cc/kg Wean PEEP and FiO2 as able, get FiO2 to 0.60 and then begin weaning PEEP Goal plateau pressure less than 30, driving pressure less than 15 Paralytics if  necessary for vent synchrony, gas exchange Cycle prone positioning if necessary for oxygenation.  This was deferred initially due to his proptosis Deep sedation per PAD protocol, goal RASS -4, currently fentanyl, midazolam Diuresis as blood pressure and renal function can tolerate, goal CVP 5-8.   Not in a position for diuresis currently given renal insufficiency VAP prevention order set Remdesivir, plan for 5 days Dexamethasone, plan for 10 days Tocilizumab given on 5/12 Follow inflammatory markers: Ferritin, D-dimer, CRP, IL-6, LDH Vitamin C, zinc Pct low 5/10. Plan to repeat and check resp cx. Hold off on empiric abx for now.   Morbid obesity, possible OSA.  Will certainly impact respiratory mechanics, ventilator weaning Suspect will need to consider additional PEEP, possible extubation to BiPAP when appropriate to consider Will need outpatient evaluation for OSA if/when he gets to acute events.  Acute toxic metabolic encephalopathy, need for sedation Continue goal RASS -3 to -4  Acute renal failure, progressive on 5/13 Follow urine output, BMP Ensure adequate renal perfusion, optimize oxygenation Renal dose medications Hold off on any diuresis for now, reassess as renal function stabilizes  Diabetes mellitus Sliding scale insulin as per protocol  Left proptosis, noted with coughing early 5/12, resolved.  Apparently per wife he has episodic intermittent proptosis at home as well. Head CT 5/12 reassuring Supportive care.  Ophthalmology did evaluate, recommended that he will need outpatient evaluation but no intervention at this time beyond keeping the eyes moist.  History of hypertension Not currently on home regimen    Best practice:  Diet: assess for TF's 5/12 Pain/Anxiety/Delirium protocol (if indicated): 5/11 > fentanyl, midazolam VAP protocol (if indicated): 5/11 DVT prophylaxis: enoxaparin  GI prophylaxis: pepcid Glucose control: SSI protocol Mobility: BR Code Status: Full Family Communication: I updated the patient's wife 5/13.  Disposition: ICU  Labs   CBC: Recent Labs  Lab 12/02/19 1537 12/02/19 1537 12/03/19 0058 12/03/19 0058 12/04/19 0450 12/04/19 1519 12/04/19 1656 12/04/19 1900 12/04/19 2037 12/05/19 0500 12/05/19 0609  WBC 7.2  --   7.4  --  8.6  --   --  7.4  --  5.8  --   NEUTROABS  --   --  6.3  --  7.3  --   --   --   --   --   --   HGB 15.5   < > 14.8   < > 14.5   < > 15.3 13.6 15.3 13.2 14.6  HCT 51.4   < > 50.0   < > 47.7   < > 45.0 47.5 45.0 47.2 43.0  MCV 100.4*  --  102.0*  --  101.3*  --   --  104.9*  --  105.8*  --   PLT 137*  --  120*  --  144*  --   --  131*  --  122*  --    < > = values in this interval not displayed.    Basic Metabolic Panel: Recent Labs  Lab 12/02/19 1537 12/02/19 1537 12/03/19 0058 12/03/19 0058 12/04/19 0450 12/04/19 1519 12/04/19 1656 12/04/19 1900 12/04/19 2037 12/05/19 0500 12/05/19 0609  NA 136   < > 137   < > 139   < > 139 141 140 143 141  K 3.8   < > 4.5   < > 5.0   < > 4.7 4.9 4.5 4.4 4.3  CL 97*  --  98  --  99  --   --  101  --  102  --  CO2 27  --  29  --  27  --   --  28  --  30  --   GLUCOSE 154*  --  171*  --  234*  --   --  171*  --  115*  --   BUN 10  --  10  --  30*  --   --  35*  --  43*  --   CREATININE 1.13  --  1.02  --  2.11*  --   --  2.99*  --  2.99*  --   CALCIUM 8.3*  --  8.1*  --  8.1*  --   --  8.4*  --  8.4*  --   MG  --   --   --   --   --   --   --  2.5*  --  2.5*  --    < > = values in this interval not displayed.   GFR: Estimated Creatinine Clearance: 52.8 mL/min (A) (by C-G formula based on SCr of 2.99 mg/dL (H)). Recent Labs  Lab 12/02/19 1537 12/02/19 1537 12/03/19 0058 12/04/19 0450 12/04/19 1900 12/05/19 0500  PROCALCITON 0.14  --   --   --  0.42 0.37  WBC 7.2   < > 7.4 8.6 7.4 5.8   < > = values in this interval not displayed.    Liver Function Tests: Recent Labs  Lab 12/03/19 0058 12/04/19 0450  AST 46* 39  ALT 27 23  ALKPHOS 47 39  BILITOT 0.8 1.2  PROT 8.1 7.8  ALBUMIN 3.2* 3.2*   No results for input(s): LIPASE, AMYLASE in the last 168 hours. No results for input(s): AMMONIA in the last 168 hours.  ABG    Component Value Date/Time   PHART 7.284 (L) 12/05/2019 0609   PCO2ART 69.7 (HH) 12/05/2019 0609     PO2ART 98 12/05/2019 0609   HCO3 33.1 (H) 12/05/2019 0609   TCO2 35 (H) 12/05/2019 0609   O2SAT 96.0 12/05/2019 0609     Coagulation Profile: Recent Labs  Lab 12/04/19 1900  INR 1.0    Cardiac Enzymes: No results for input(s): CKTOTAL, CKMB, CKMBINDEX, TROPONINI in the last 168 hours.  HbA1C: Hgb A1c MFr Bld  Date/Time Value Ref Range Status  12/03/2019 07:32 PM 6.6 (H) 4.8 - 5.6 % Final    Comment:    (NOTE) Pre diabetes:          5.7%-6.4% Diabetes:              >6.4% Glycemic control for   <7.0% adults with diabetes   04/29/2018 03:10 AM 6.0 (H) 4.8 - 5.6 % Final    Comment:    (NOTE) Pre diabetes:          5.7%-6.4% Diabetes:              >6.4% Glycemic control for   <7.0% adults with diabetes     CBG: Recent Labs  Lab 12/04/19 2041 12/04/19 2319 12/05/19 0306 12/05/19 0732 12/05/19 1123  GLUCAP 148* 143* 132* 110* 128*     Critical care time: 33 min     Levy Pupa, MD, PhD 12/05/2019, 12:10 PM [place] [facility] [phone] or if no answer [phone]

## 2019-12-05 NOTE — Progress Notes (Signed)
Wife updated via phone. Dicussed patient status and plan of care.

## 2019-12-06 ENCOUNTER — Inpatient Hospital Stay (HOSPITAL_COMMUNITY): Payer: No Typology Code available for payment source

## 2019-12-06 DIAGNOSIS — N179 Acute kidney failure, unspecified: Secondary | ICD-10-CM

## 2019-12-06 DIAGNOSIS — M7989 Other specified soft tissue disorders: Secondary | ICD-10-CM

## 2019-12-06 DIAGNOSIS — R609 Edema, unspecified: Secondary | ICD-10-CM

## 2019-12-06 LAB — CBC
HCT: 45.3 % (ref 39.0–52.0)
Hemoglobin: 13 g/dL (ref 13.0–17.0)
MCH: 29.9 pg (ref 26.0–34.0)
MCHC: 28.7 g/dL — ABNORMAL LOW (ref 30.0–36.0)
MCV: 104.1 fL — ABNORMAL HIGH (ref 80.0–100.0)
Platelets: 149 10*3/uL — ABNORMAL LOW (ref 150–400)
RBC: 4.35 MIL/uL (ref 4.22–5.81)
RDW: 12.5 % (ref 11.5–15.5)
WBC: 5.2 10*3/uL (ref 4.0–10.5)
nRBC: 0.4 % — ABNORMAL HIGH (ref 0.0–0.2)

## 2019-12-06 LAB — BASIC METABOLIC PANEL WITH GFR
Anion gap: 9 (ref 5–15)
BUN: 55 mg/dL — ABNORMAL HIGH (ref 6–20)
CO2: 31 mmol/L (ref 22–32)
Calcium: 8.5 mg/dL — ABNORMAL LOW (ref 8.9–10.3)
Chloride: 104 mmol/L (ref 98–111)
Creatinine, Ser: 2.65 mg/dL — ABNORMAL HIGH (ref 0.61–1.24)
GFR calc Af Amer: 34 mL/min — ABNORMAL LOW
GFR calc non Af Amer: 30 mL/min — ABNORMAL LOW
Glucose, Bld: 146 mg/dL — ABNORMAL HIGH (ref 70–99)
Potassium: 4.1 mmol/L (ref 3.5–5.1)
Sodium: 144 mmol/L (ref 135–145)

## 2019-12-06 LAB — POCT I-STAT 7, (LYTES, BLD GAS, ICA,H+H)
Acid-Base Excess: 5 mmol/L — ABNORMAL HIGH (ref 0.0–2.0)
Acid-Base Excess: 4 mmol/L — ABNORMAL HIGH (ref 0.0–2.0)
Bicarbonate: 33.1 mmol/L — ABNORMAL HIGH (ref 20.0–28.0)
Bicarbonate: 34.3 mmol/L — ABNORMAL HIGH (ref 20.0–28.0)
Calcium, Ion: 1.21 mmol/L (ref 1.15–1.40)
Calcium, Ion: 1.24 mmol/L (ref 1.15–1.40)
HCT: 41 % (ref 39.0–52.0)
HCT: 43 % (ref 39.0–52.0)
Hemoglobin: 13.9 g/dL (ref 13.0–17.0)
Hemoglobin: 14.6 g/dL (ref 13.0–17.0)
O2 Saturation: 93 %
O2 Saturation: 91 %
Patient temperature: 99.7
Potassium: 4.3 mmol/L (ref 3.5–5.1)
Potassium: 4.6 mmol/L (ref 3.5–5.1)
Sodium: 143 mmol/L (ref 135–145)
Sodium: 143 mmol/L (ref 135–145)
TCO2: 35 mmol/L — ABNORMAL HIGH (ref 22–32)
TCO2: 37 mmol/L — ABNORMAL HIGH (ref 22–32)
pCO2 arterial: 64.1 mmHg — ABNORMAL HIGH (ref 32.0–48.0)
pCO2 arterial: 76.2 mmHg (ref 32.0–48.0)
pH, Arterial: 7.324 — ABNORMAL LOW (ref 7.350–7.450)
pH, Arterial: 7.262 — ABNORMAL LOW (ref 7.350–7.450)
pO2, Arterial: 75 mmHg — ABNORMAL LOW (ref 83.0–108.0)
pO2, Arterial: 74 mmHg — ABNORMAL LOW (ref 83.0–108.0)

## 2019-12-06 LAB — PHOSPHORUS: Phosphorus: 4.5 mg/dL (ref 2.5–4.6)

## 2019-12-06 LAB — D-DIMER, QUANTITATIVE: D-Dimer, Quant: 0.89 ug/mL-FEU — ABNORMAL HIGH (ref 0.00–0.50)

## 2019-12-06 LAB — FERRITIN: Ferritin: 378 ng/mL — ABNORMAL HIGH (ref 24–336)

## 2019-12-06 LAB — CULTURE, RESPIRATORY W GRAM STAIN: Culture: NO GROWTH

## 2019-12-06 LAB — GLUCOSE, CAPILLARY
Glucose-Capillary: 123 mg/dL — ABNORMAL HIGH (ref 70–99)
Glucose-Capillary: 115 mg/dL — ABNORMAL HIGH (ref 70–99)
Glucose-Capillary: 179 mg/dL — ABNORMAL HIGH (ref 70–99)
Glucose-Capillary: 175 mg/dL — ABNORMAL HIGH (ref 70–99)
Glucose-Capillary: 151 mg/dL — ABNORMAL HIGH (ref 70–99)
Glucose-Capillary: 130 mg/dL — ABNORMAL HIGH (ref 70–99)

## 2019-12-06 LAB — C-REACTIVE PROTEIN: CRP: 3.3 mg/dL — ABNORMAL HIGH (ref <1.0)

## 2019-12-06 LAB — PROCALCITONIN: Procalcitonin: 0.15 ng/mL

## 2019-12-06 LAB — LACTATE DEHYDROGENASE: LDH: 275 U/L — ABNORMAL HIGH (ref 98–192)

## 2019-12-06 LAB — MAGNESIUM: Magnesium: 2.5 mg/dL — ABNORMAL HIGH (ref 1.7–2.4)

## 2019-12-06 MED ORDER — OXYCODONE HCL 5 MG PO TABS
5.0000 mg | ORAL_TABLET | Freq: Four times a day (QID) | ORAL | Status: DC
  Administered 2019-12-06 – 2019-12-16 (×38): 5 mg
  Filled 2019-12-06 (×38): qty 1

## 2019-12-06 MED ORDER — SODIUM CHLORIDE 0.9 % IV SOLN
0.5000 mg/kg/h | INTRAVENOUS | Status: DC
  Administered 2019-12-06 – 2019-12-09 (×13): 0.5 mg/kg/h via INTRAVENOUS
  Administered 2019-12-10: 0.3 mg/kg/h via INTRAVENOUS
  Administered 2019-12-10: 0.5 mg/kg/h via INTRAVENOUS
  Filled 2019-12-06 (×19): qty 5

## 2019-12-06 MED ORDER — VECURONIUM BROMIDE 10 MG IV SOLR
10.0000 mg | Freq: Once | INTRAVENOUS | Status: AC
  Administered 2019-12-06: 10 mg via INTRAVENOUS
  Filled 2019-12-06: qty 10

## 2019-12-06 MED ORDER — FUROSEMIDE 10 MG/ML IJ SOLN
80.0000 mg | Freq: Once | INTRAMUSCULAR | Status: AC
  Administered 2019-12-06: 80 mg via INTRAVENOUS
  Filled 2019-12-06: qty 8

## 2019-12-06 MED ORDER — CLONAZEPAM 1 MG PO TABS
1.0000 mg | ORAL_TABLET | Freq: Two times a day (BID) | ORAL | Status: DC
  Administered 2019-12-06 – 2019-12-23 (×36): 1 mg
  Filled 2019-12-06 (×36): qty 1

## 2019-12-06 NOTE — Progress Notes (Signed)
Lower extremity venous has been completed.   Preliminary results in CV Proc.   Blanch Media 12/06/2019 3:31 PM

## 2019-12-06 NOTE — Progress Notes (Signed)
RN alerted me to the fact that the patient's ventilator was ringing out.  RN felt patient was biting down on tube.  Placed bite block in patient's mouth and tried suctioning patient due to high peak pressure.  Secretions were extremely thick and coating ballard.  Lavaged patient and got back thick yellow secretions.  Will continue to monitor.

## 2019-12-06 NOTE — Progress Notes (Signed)
NAME:  Clifford Nichols, MRN:  [id number], DOB:  Jan 13, 1981, LOS: 2 [facility] ADMISSION DATE:  12/04/2019, [facility] Admission DATE:  12/04/19 REFERRING MD:  Dr Belia Heman, CCM, CHIEF COMPLAINT:  Acute respiratory failure   Brief History   39 year old obese man with hypertension, diabetes admitted with acute respiratory failure, ARDS from COVID-19 pneumonia, initial positive test 5/10   Past Medical History   Past Medical History:  Diagnosis Date  . Abscess of upper back excluding scapular region   . Diabetes mellitus without complication ([facility])   . GERD (gastroesophageal reflux disease)    OCC-NO MEDS  . Hypertension   . Sebaceous cyst     Significant Hospital Events   5/10 admitted [facility]  5/11 failed HHFNC, BiPAP.  Required intubation 5/12 transferred to [facility] 5/13 High PEEP and FiO2 requirement through the night Had continued issues with left proptosis, prone position deferred due to this  Consults:  Ophthalmology for proptosis 5/12  Procedures:  ETT 5/11 >>  L femoral CVC 5/11 >>   Significant Diagnostic Tests:  Head CT 5/11 >> no significant intracranial abnormality.  Scattered opacification and air-fluid levels in the frontal, ethmoid, sphenoid and maxillary sinuses of unclear significance. Head CT 5/12 >> no significant intracranial abnormality  Micro Data:  SARS CoV2 5/10 >> positive  Antimicrobials:  Remdesivir 5/11 >> Dexamethasone 5/11 >> Tocilizumab 5/12  Interim history/subjective:   Critically ill, intubated Sedated with high-dose fentanyl and Versed with intermittent breakthrough per RN Low-grade febrile Good urine output  Objective   Blood pressure 121/77, pulse 97, temperature 99.1 F (37.3 C), temperature source Axillary, resp. rate (!) 30, height 5\' 7"  (1.702 m), weight (!) 174 kg, SpO2 (!) 87 %.    Vent Mode: PRVC FiO2 (%):  [60 %-70 %] 70 % Set Rate:  [30 bmp-34 bmp] 30 bmp Vt Set:  [380 mL] 380 mL PEEP:  [14 cmH20-18 cmH20] 14 cmH20 Plateau Pressure:   [26 cmH20-30 cmH20] 26 cmH20   Intake/Output Summary (Last 24 hours) at 12/06/2019 1238 Last data filed at 12/06/2019 0800 Gross per 24 hour  Intake 1221.46 ml  Output 1003 ml  Net 218.46 ml   Filed Weights   12/04/19 1420  Weight: (!) 174 kg    Examination: General: Morbidly obese man, sedated, ventilated HENT: Oral ETT, no pallor or icterus mild proptosis noted Lungs: Very distant, coarse, no wheeze Cardiovascular: Regular, distant, no murmur Abdomen: Obese, nondistended, positive bowel sounds Extremities: No significant edema Neuro: Sedated.  RASS -4 GU: Foley catheter in place  Chest x-ray 5/14 personally reviewed which shows left basilar airspace disease and bilateral interstitial infiltrates.  Labs show slight decrease in creatinine, normal electrolytes, no leukocytosis.  ABG shows severe hypoxia and partially compensated hypercarbia  Resolved Hospital Problem list    Left proptosis, noted with coughing early 5/12, resolved.  Apparently per wife he has episodic intermittent proptosis at home as well. Head CT 5/12 reassuring Ophthalmology did evaluate, recommended that he will need outpatient evaluation but no intervention at this time beyond keeping the eyes moist.  Assessment & Plan:   ARDS due to COVID-19 pneumonia  Morbid obesity, possible OSA.  Will certainly impact respiratory mechanics Continue mechanical ventilation via ARDS protocol, target PRVC 6 cc/kg Wean PEEP and FiO2 as able, lower PEEP only after FiO2 at 50% Goal plateau pressure less than 30 >> difficult to achieve since obese chest wall, driving pressure less than 15 We will give 1 dose of rocuronium for vent synchrony and see if  that improves mechanics Prone position only as P/F ratio less than 50 but being considered with severe   VAP prevention order set Remdesivir, plan for 5 days Dexamethasone, plan for 10 days Tocilizumab given on 5/12 Follow inflammatory markers: Ferritin, D-dimer, CRP,  IL-6, LDH Vitamin C, zinc Hypoxia is out of proportion to pulmonary infiltrates, cannot obtain CT angiogram, obtain venous duplex given the D-dimer level   Acute toxic metabolic encephalopathy, need for sedation Deep sedation per PAD protocol, goal RASS -4, currently fentanyl, midazolam And enteral clonazepam and oxycodone  AKI  Follow urine output, BMP Ensure adequate renal perfusion Avoid nephrotoxins, hold diuresis for now  Diabetes mellitus Sliding scale insulin as per protocol       Best practice:  Diet: assess for TF's 5/12 Pain/Anxiety/Delirium protocol (if indicated): 5/11 > fentanyl, midazolam VAP protocol (if indicated): 5/11 DVT prophylaxis: enoxaparin  GI prophylaxis: pepcid Glucose control: SSI protocol Mobility: BR Code Status: Full Family Communication: wife   Disposition: ICU   The patient is critically ill with multiple organ systems failure and requires high complexity decision making for assessment and support, frequent evaluation and titration of therapies, application of advanced monitoring technologies and extensive interpretation of multiple databases. Critical Care Time devoted to patient care services described in this note independent of APP/resident  time is 35 minutes.    Kara Mead MD. [place] Flood. [place] [facility]  If no response to pager , please call [phone]   12/06/2019

## 2019-12-06 NOTE — Progress Notes (Signed)
Notified E-link of low UOP, also got a CVP that was 16. No new interventions at this time.

## 2019-12-06 NOTE — Progress Notes (Signed)
Poor UO >> lasix 80 x 1 Frequent versed/ fent boluses required >> Add ketamine infusion  Rakesh V. Vassie Loll MD

## 2019-12-06 NOTE — Progress Notes (Signed)
Assisted tele visit to patient with wife, Carollee Leitz x 2 today.  Vena Austria, RN

## 2019-12-07 ENCOUNTER — Inpatient Hospital Stay (HOSPITAL_COMMUNITY): Payer: No Typology Code available for payment source

## 2019-12-07 LAB — BASIC METABOLIC PANEL
Anion gap: 11 (ref 5–15)
BUN: 70 mg/dL — ABNORMAL HIGH (ref 6–20)
CO2: 29 mmol/L (ref 22–32)
Calcium: 8.3 mg/dL — ABNORMAL LOW (ref 8.9–10.3)
Chloride: 106 mmol/L (ref 98–111)
Creatinine, Ser: 2.73 mg/dL — ABNORMAL HIGH (ref 0.61–1.24)
GFR calc Af Amer: 33 mL/min — ABNORMAL LOW (ref >60)
GFR calc non Af Amer: 29 mL/min — ABNORMAL LOW (ref >60)
Glucose, Bld: 117 mg/dL — ABNORMAL HIGH (ref 70–99)
Potassium: 4.4 mmol/L (ref 3.5–5.1)
Sodium: 146 mmol/L — ABNORMAL HIGH (ref 135–145)

## 2019-12-07 LAB — INTERLEUKIN-6, SERUM  IL6SL: Interleukin-6, Serum: 221 pg/mL — ABNORMAL HIGH (ref 0.0–13.0)

## 2019-12-07 LAB — CBC
HCT: 46.8 % (ref 39.0–52.0)
Hemoglobin: 12.9 g/dL — ABNORMAL LOW (ref 13.0–17.0)
MCH: 29.8 pg (ref 26.0–34.0)
MCHC: 27.6 g/dL — ABNORMAL LOW (ref 30.0–36.0)
MCV: 108.1 fL — ABNORMAL HIGH (ref 80.0–100.0)
Platelets: 151 10*3/uL (ref 150–400)
RBC: 4.33 MIL/uL (ref 4.22–5.81)
RDW: 12.8 % (ref 11.5–15.5)
WBC: 5.5 10*3/uL (ref 4.0–10.5)
nRBC: 0.4 % — ABNORMAL HIGH (ref 0.0–0.2)

## 2019-12-07 LAB — PHOSPHORUS: Phosphorus: 5.1 mg/dL — ABNORMAL HIGH (ref 2.5–4.6)

## 2019-12-07 LAB — POCT I-STAT 7, (LYTES, BLD GAS, ICA,H+H)
Acid-Base Excess: 6 mmol/L — ABNORMAL HIGH (ref 0.0–2.0)
Bicarbonate: 35.2 mmol/L — ABNORMAL HIGH (ref 20.0–28.0)
Calcium, Ion: 1.23 mmol/L (ref 1.15–1.40)
HCT: 41 % (ref 39.0–52.0)
Hemoglobin: 13.9 g/dL (ref 13.0–17.0)
O2 Saturation: 91 %
Patient temperature: 100.6
Potassium: 4.4 mmol/L (ref 3.5–5.1)
Sodium: 146 mmol/L — ABNORMAL HIGH (ref 135–145)
TCO2: 37 mmol/L — ABNORMAL HIGH (ref 22–32)
pCO2 arterial: 75.4 mmHg (ref 32.0–48.0)
pH, Arterial: 7.283 — ABNORMAL LOW (ref 7.350–7.450)
pO2, Arterial: 75 mmHg — ABNORMAL LOW (ref 83.0–108.0)

## 2019-12-07 LAB — C-REACTIVE PROTEIN: CRP: 1.8 mg/dL — ABNORMAL HIGH (ref <1.0)

## 2019-12-07 LAB — MAGNESIUM: Magnesium: 2.6 mg/dL — ABNORMAL HIGH (ref 1.7–2.4)

## 2019-12-07 LAB — GLUCOSE, CAPILLARY
Glucose-Capillary: 85 mg/dL (ref 70–99)
Glucose-Capillary: 117 mg/dL — ABNORMAL HIGH (ref 70–99)
Glucose-Capillary: 184 mg/dL — ABNORMAL HIGH (ref 70–99)
Glucose-Capillary: 171 mg/dL — ABNORMAL HIGH (ref 70–99)
Glucose-Capillary: 129 mg/dL — ABNORMAL HIGH (ref 70–99)

## 2019-12-07 LAB — D-DIMER, QUANTITATIVE: D-Dimer, Quant: 1.08 ug/mL-FEU — ABNORMAL HIGH (ref 0.00–0.50)

## 2019-12-07 LAB — FERRITIN: Ferritin: 405 ng/mL — ABNORMAL HIGH (ref 24–336)

## 2019-12-07 MED ORDER — ENOXAPARIN SODIUM 80 MG/0.8ML ~~LOC~~ SOLN
0.5000 mg/kg | SUBCUTANEOUS | Status: DC
  Administered 2019-12-08 – 2020-01-11 (×35): 80 mg via SUBCUTANEOUS
  Filled 2019-12-07 (×35): qty 0.8

## 2019-12-07 MED ORDER — ACETAMINOPHEN 160 MG/5ML PO SOLN
650.0000 mg | Freq: Four times a day (QID) | ORAL | Status: DC | PRN
  Administered 2019-12-07 – 2019-12-15 (×13): 650 mg via ORAL
  Filled 2019-12-07 (×13): qty 20.3

## 2019-12-07 NOTE — Progress Notes (Signed)
eLink Physician-Brief Progress Note Patient Name: Clifford Nichols DOB: April 11, 1981 MRN: [id number]   Date of Service  12/07/2019  HPI/Events of Note  Nursing request for thyroid studies.   eICU Interventions  Will order: T4, T3 uptake and TSH at 5 AM.      Intervention Category Major Interventions: Other:  [name] 12/07/2019, 10:14 PM

## 2019-12-07 NOTE — Progress Notes (Signed)
Assisted tele visit to patient with family member.  Camyra Vaeth M, RN  

## 2019-12-07 NOTE — Progress Notes (Signed)
NAME:  Clifford Nichols, MRN:  [id number], DOB:  01/17/1981, LOS: 3 [facility] ADMISSION DATE:  12/04/2019, [facility] Admission DATE:  12/04/19 REFERRING MD:  Dr Belia Heman, CCM, CHIEF COMPLAINT:  Acute respiratory failure   Brief History   39 year old obese man with hypertension, diabetes admitted with acute respiratory failure, ARDS from COVID-19 pneumonia, initial positive test 5/10   Past Medical History   Past Medical History:  Diagnosis Date  . Abscess of upper back excluding scapular region   . Diabetes mellitus without complication ([facility])   . GERD (gastroesophageal reflux disease)    OCC-NO MEDS  . Hypertension   . Sebaceous cyst     Significant Hospital Events   5/10 admitted [facility]  5/11 failed HHFNC, BiPAP.  Required intubation 5/12 transferred to [facility] 5/13 High PEEP and FiO2 requirement through the night Had continued issues with left proptosis, prone position deferred due to this  Consults:  Ophthalmology for proptosis 5/12  Procedures:  ETT 5/11 >>  L femoral CVC 5/11 >>   Significant Diagnostic Tests:  Head CT 5/11 >> no significant intracranial abnormality.  Scattered opacification and air-fluid levels in the frontal, ethmoid, sphenoid and maxillary sinuses of unclear significance. Head CT 5/12 >> no significant intracranial abnormality  Micro Data:  SARS CoV2 5/10 >> positive  Antimicrobials:  Remdesivir 5/11 >> Dexamethasone 5/11 >> Tocilizumab 5/12  Interim history/subjective:   Critically ill, intubated Sedated with high-dose fentanyl and Versed with intermittent breakthrough per RN Low-grade febrile Good urine output  Objective   Blood pressure 135/75, pulse 99, temperature (!) 101.1 F (38.4 C), temperature source Axillary, resp. rate (!) 30, height 5\' 7"  (1.702 m), weight (!) 155 kg, SpO2 92 %. CVP:  [16 mmHg] 16 mmHg  Vent Mode: PRVC FiO2 (%):  [80 %-90 %] 90 % Set Rate:  [30 bmp] 30 bmp Vt Set:  [380 mL] 380 mL PEEP:  [16 cmH20] 16 cmH20 Plateau  Pressure:  [29 cmH20-33 cmH20] 29 cmH20   Intake/Output Summary (Last 24 hours) at 12/07/2019 1114 Last data filed at 12/07/2019 0900 Gross per 24 hour  Intake 2343.82 ml  Output 96 ml  Net 2247.82 ml   Filed Weights   12/04/19 1420 12/07/19 0344  Weight: (!) 174 kg (!) 155 kg    Examination: General: Morbidly obese man, sedated, ventilated HENT: Oral ETT, no pallor or icterus mild proptosis noted Lungs: Very distant, coarse, no wheeze Cardiovascular: Regular, distant, no murmur Abdomen: Obese, nondistended, positive bowel sounds Extremities: trace edema in lower extrem. 2+ pulses Neuro: Sedated. PERRL, no dolls eyes (proptosis though), + gag reflex  RASS -4 GU: Foley catheter in place, urine clear  Chest x-ray 5/15 personally reviewed which shows bibasilar airspace disease and bilateral interstitial infiltrates. No significant change from 5/14  Labs show slight increase in creatinine, normal electrolytes, no leukocytosis.  ABG shows severe hypoxia and partially compensated hypercarbia  Resolved Hospital Problem list    Left proptosis, noted with coughing early 5/12, resolved.  Apparently per wife he has episodic intermittent proptosis at home as well. Head CT 5/12 reassuring Ophthalmology did evaluate, recommended that he will need outpatient evaluation but no intervention at this time beyond keeping the eyes moist.  Assessment & Plan:   ARDS due to COVID-19 pneumonia  Morbid obesity, possible OSA.  Will certainly impact respiratory mechanics Continue mechanical ventilation via ARDS protocol, target PRVC 6 cc/kg Wean PEEP and FiO2 as able, lower PEEP only after FiO2 at 50% Goal plateau pressure less than 30 >>  difficult to achieve since obese chest wall, driving pressure less than 15 Prone position only as P/F ratio less than 50 but being considered with severe   VAP prevention order set Remdesivir, plan for 5 days Dexamethasone, plan for 10 days Tocilizumab given on  5/12 Follow inflammatory markers: Ferritin, D-dimer, CRP, IL-6, LDH Vitamin C, zinc Hypoxia is out of proportion to pulmonary infiltrates, cannot obtain CT angiogram, obtain venous duplex given the D-dimer level   Acute toxic metabolic encephalopathy, need for sedation Deep sedation per PAD protocol, goal RASS -4, currently fentanyl, midazolam And enteral clonazepam and oxycodone, ketamine gtt seems to be effective  AKI  Follow urine output, BMP Ensure adequate renal perfusion Avoid nephrotoxins, hold diuresis for now --if continues to worsen, nephrology consult for possible dialysis  Diabetes mellitus Sliding scale insulin as per protocol       Best practice:  Diet: assess for TF's 5/12 Pain/Anxiety/Delirium protocol (if indicated): 5/11 > fentanyl, midazolam VAP protocol (if indicated): 5/11 DVT prophylaxis: enoxaparin  GI prophylaxis: pepcid Glucose control: SSI protocol Mobility: BR Code Status: Full Family Communication: wife 5/15 Disposition: ICU   The patient is critically ill with multiple organ systems failure and requires high complexity decision making for assessment and support, frequent evaluation and titration of therapies, application of advanced monitoring technologies and extensive interpretation of multiple databases. Critical Care Time devoted to patient care services described in this note independent of APP/resident  time is 44 minutes.   12/07/2019 11:20 AM

## 2019-12-07 NOTE — Progress Notes (Signed)
Video call with wife and daughter. Pt intubated, sedated and unresponsive to camera.

## 2019-12-08 ENCOUNTER — Inpatient Hospital Stay (HOSPITAL_COMMUNITY): Payer: No Typology Code available for payment source

## 2019-12-08 LAB — COMPREHENSIVE METABOLIC PANEL
ALT: 37 U/L (ref 0–44)
AST: 51 U/L — ABNORMAL HIGH (ref 15–41)
Albumin: 2.9 g/dL — ABNORMAL LOW (ref 3.5–5.0)
Alkaline Phosphatase: 32 U/L — ABNORMAL LOW (ref 38–126)
Anion gap: 8 (ref 5–15)
BUN: 85 mg/dL — ABNORMAL HIGH (ref 6–20)
CO2: 32 mmol/L (ref 22–32)
Calcium: 8.9 mg/dL (ref 8.9–10.3)
Chloride: 110 mmol/L (ref 98–111)
Creatinine, Ser: 3.17 mg/dL — ABNORMAL HIGH (ref 0.61–1.24)
GFR calc Af Amer: 28 mL/min — ABNORMAL LOW (ref >60)
GFR calc non Af Amer: 24 mL/min — ABNORMAL LOW (ref >60)
Glucose, Bld: 130 mg/dL — ABNORMAL HIGH (ref 70–99)
Potassium: 4.8 mmol/L (ref 3.5–5.1)
Sodium: 150 mmol/L — ABNORMAL HIGH (ref 135–145)
Total Bilirubin: 1.3 mg/dL — ABNORMAL HIGH (ref 0.3–1.2)
Total Protein: 6.7 g/dL (ref 6.5–8.1)

## 2019-12-08 LAB — CBC WITH DIFFERENTIAL/PLATELET
Abs Immature Granulocytes: 0.06 10*3/uL (ref 0.00–0.07)
Basophils Absolute: 0 10*3/uL (ref 0.0–0.1)
Basophils Relative: 0 %
Eosinophils Absolute: 0.1 10*3/uL (ref 0.0–0.5)
Eosinophils Relative: 2 %
HCT: 46.9 % (ref 39.0–52.0)
Hemoglobin: 12.5 g/dL — ABNORMAL LOW (ref 13.0–17.0)
Immature Granulocytes: 1 %
Lymphocytes Relative: 23 %
Lymphs Abs: 1.4 10*3/uL (ref 0.7–4.0)
MCH: 29.9 pg (ref 26.0–34.0)
MCHC: 26.7 g/dL — ABNORMAL LOW (ref 30.0–36.0)
MCV: 112.2 fL — ABNORMAL HIGH (ref 80.0–100.0)
Monocytes Absolute: 0.7 10*3/uL (ref 0.1–1.0)
Monocytes Relative: 12 %
Neutro Abs: 3.8 10*3/uL (ref 1.7–7.7)
Neutrophils Relative %: 62 %
Platelets: 169 10*3/uL (ref 150–400)
RBC: 4.18 MIL/uL — ABNORMAL LOW (ref 4.22–5.81)
RDW: 13.1 % (ref 11.5–15.5)
WBC: 6.1 10*3/uL (ref 4.0–10.5)
nRBC: 0.3 % — ABNORMAL HIGH (ref 0.0–0.2)

## 2019-12-08 LAB — GLUCOSE, CAPILLARY
Glucose-Capillary: 104 mg/dL — ABNORMAL HIGH (ref 70–99)
Glucose-Capillary: 109 mg/dL — ABNORMAL HIGH (ref 70–99)
Glucose-Capillary: 113 mg/dL — ABNORMAL HIGH (ref 70–99)
Glucose-Capillary: 122 mg/dL — ABNORMAL HIGH (ref 70–99)
Glucose-Capillary: 143 mg/dL — ABNORMAL HIGH (ref 70–99)
Glucose-Capillary: 152 mg/dL — ABNORMAL HIGH (ref 70–99)
Glucose-Capillary: 160 mg/dL — ABNORMAL HIGH (ref 70–99)

## 2019-12-08 LAB — D-DIMER, QUANTITATIVE: D-Dimer, Quant: 1.53 ug/mL-FEU — ABNORMAL HIGH (ref 0.00–0.50)

## 2019-12-08 LAB — PHOSPHORUS: Phosphorus: 4.9 mg/dL — ABNORMAL HIGH (ref 2.5–4.6)

## 2019-12-08 LAB — FERRITIN: Ferritin: 532 ng/mL — ABNORMAL HIGH (ref 24–336)

## 2019-12-08 LAB — MAGNESIUM: Magnesium: 3.1 mg/dL — ABNORMAL HIGH (ref 1.7–2.4)

## 2019-12-08 LAB — TSH: TSH: 3.425 u[IU]/mL (ref 0.350–4.500)

## 2019-12-08 LAB — C-REACTIVE PROTEIN: CRP: 1.4 mg/dL — ABNORMAL HIGH (ref <1.0)

## 2019-12-08 NOTE — Progress Notes (Signed)
Assisted tele visit to patient with family member.  La Shehan M, RN  

## 2019-12-08 NOTE — Progress Notes (Signed)
eLink Physician-Brief Progress Note Patient Name: MIKING USREY DOB: Oct 03, 1980 MRN: [id number]   Date of Service  12/08/2019  HPI/Events of Note  Notified of fever. Intubated since 5/11. CXR earlier today unchanged  eICU Interventions  Ordered blood culture and UA. Will reassess need to start antibiotics     Intervention Category Major Interventions: Infection - evaluation and management  Darl Pikes 12/08/2019, 11:57 PM

## 2019-12-08 NOTE — Progress Notes (Signed)
NAME:  Clifford Nichols, MRN:  [id number], DOB:  Nov 20, 1980, LOS: 4 [facility] ADMISSION DATE:  12/04/2019, [facility] Admission DATE:  12/04/19 REFERRING MD:  Dr Belia Heman, CCM, CHIEF COMPLAINT:  Acute respiratory failure   Brief History   39 year old obese man with hypertension, diabetes admitted with acute respiratory failure, ARDS from COVID-19 pneumonia, initial positive test 5/10   Past Medical History   Past Medical History:  Diagnosis Date  . Abscess of upper back excluding scapular region   . Diabetes mellitus without complication (HCC)   . GERD (gastroesophageal reflux disease)    OCC-NO MEDS  . Hypertension   . Sebaceous cyst     Significant Hospital Events   5/10 admitted [facility]  5/11 failed HHFNC, BiPAP.  Required intubation 5/12 transferred to [facility] 5/13 High PEEP and FiO2 requirement through the night Had continued issues with left proptosis, prone position deferred due to this  Consults:  Ophthalmology for proptosis 5/12  Procedures:  ETT 5/11 >>  L femoral CVC 5/11 >>   Significant Diagnostic Tests:  Head CT 5/11 >> no significant intracranial abnormality.  Scattered opacification and air-fluid levels in the frontal, ethmoid, sphenoid and maxillary sinuses of unclear significance. Head CT 5/12 >> no significant intracranial abnormality  Micro Data:  SARS CoV2 5/10 >> positive  Antimicrobials:  Remdesivir 5/11 >> Dexamethasone 5/11 >> Tocilizumab 5/12  Interim history/subjective:   Critically ill, intubated Sedated with high-dose fentanyl and Versed with intermittent breakthrough per RN Low-grade febrile UOP acceptable  Objective   Blood pressure (!) 118/58, pulse (!) 118, temperature (!) 101.8 F (38.8 C), temperature source Oral, resp. rate (!) 29, height 5\' 7"  (1.702 m), weight (!) 185.5 kg, SpO2 91 %.    Vent Mode: PRVC FiO2 (%):  [70 %-100 %] 100 % Set Rate:  [30 bmp] 30 bmp Vt Set:  [380 mL] 380 mL PEEP:  [16 cmH20] 16 cmH20 Plateau Pressure:  [29  cmH20-35 cmH20] 33 cmH20   Intake/Output Summary (Last 24 hours) at 12/08/2019 1512 Last data filed at 12/08/2019 1400 Gross per 24 hour  Intake 2485.63 ml  Output 2950 ml  Net -464.37 ml   Filed Weights   12/04/19 1420 12/07/19 0344 12/08/19 0500  Weight: (!) 174 kg (!) 155 kg (!) 185.5 kg    Examination: General: Morbidly obese man, sedated, ventilated HENT: Oral ETT, no pallor or icterus mild proptosis noted Lungs: Very distant, coarse, no wheeze Cardiovascular: Regular, distant, no murmur Abdomen: Obese, nondistended, positive bowel sounds Extremities: trace edema in lower extrem. 2+ pulses Neuro: Sedated. PERRL, no dolls eyes (proptosis though), + gag reflex  RASS -4 GU: Foley catheter in place, urine clear   Resolved Hospital Problem list    Left proptosis, noted with coughing early 5/12, resolved.  Apparently per wife he has episodic intermittent proptosis at home as well. Head CT 5/12 reassuring Ophthalmology did evaluate, recommended that he will need outpatient evaluation but no intervention at this time beyond keeping the eyes moist.  Assessment & Plan:   ARDS due to COVID-19 pneumonia  Morbid obesity, possible OSA.  Will certainly impact respiratory mechanics Continue mechanical ventilation via ARDS protocol, target PRVC 6 cc/kg Wean PEEP and FiO2 as able, lower PEEP only after FiO2 at 50% Goal plateau pressure less than 30 >> difficult to achieve since obese chest wall, driving pressure less than 15 Prone position only as P/F ratio less than 50 but being considered with severe  VAP prevention order set Remdesivir, plan for 5 days  Dexamethasone, plan for 10 days Tocilizumab given on 5/12 Follow inflammatory markers: Ferritin, D-dimer, CRP, IL-6, LDH Vitamin C, zinc Hypoxia is out of proportion to pulmonary infiltrates, cannot obtain CT angiogram, obtain venous duplex given the D-dimer level   Acute toxic metabolic encephalopathy, need for sedation Deep  sedation per PAD protocol, goal RASS -4, currently fentanyl, midazolam And enteral clonazepam and oxycodone, ketamine gtt seems to be effective  AKI  Follow urine output, BMP Ensure adequate renal perfusion Avoid nephrotoxins, hold diuresis for now --if continues to worsen, nephrology consult for possible dialysis  Diabetes mellitus Sliding scale insulin as per protocol       Best practice:  Diet: resume TF Pain/Anxiety/Delirium protocol (if indicated): 5/11 > fentanyl, midazolam VAP protocol (if indicated): 5/11 DVT prophylaxis: enoxaparin  GI prophylaxis: pepcid Glucose control: SSI protocol Mobility: BR Code Status: Full Family Communication: wife 5/15 Disposition: ICU   The patient is critically ill with multiple organ systems failure and requires high complexity decision making for assessment and support, frequent evaluation and titration of therapies, application of advanced monitoring technologies and extensive interpretation of multiple databases. Critical Care Time devoted to patient care services described in this note independent of APP/resident  time is 44 minutes.   12/08/2019 3:12 PM

## 2019-12-08 NOTE — Progress Notes (Signed)
Assisted tele visit to patient with family member.  Mianna Iezzi M, RN   

## 2019-12-09 ENCOUNTER — Inpatient Hospital Stay (HOSPITAL_COMMUNITY): Payer: No Typology Code available for payment source

## 2019-12-09 DIAGNOSIS — J9600 Acute respiratory failure, unspecified whether with hypoxia or hypercapnia: Secondary | ICD-10-CM

## 2019-12-09 DIAGNOSIS — J9601 Acute respiratory failure with hypoxia: Secondary | ICD-10-CM

## 2019-12-09 DIAGNOSIS — I5023 Acute on chronic systolic (congestive) heart failure: Secondary | ICD-10-CM

## 2019-12-09 LAB — BLOOD CULTURE ID PANEL (REFLEXED)

## 2019-12-09 LAB — POCT I-STAT 7, (LYTES, BLD GAS, ICA,H+H)
Acid-Base Excess: 7 mmol/L — ABNORMAL HIGH (ref 0.0–2.0)
Acid-Base Excess: 7 mmol/L — ABNORMAL HIGH (ref 0.0–2.0)
Acid-Base Excess: 6 mmol/L — ABNORMAL HIGH (ref 0.0–2.0)
Bicarbonate: 37.3 mmol/L — ABNORMAL HIGH (ref 20.0–28.0)
Bicarbonate: 38 mmol/L — ABNORMAL HIGH (ref 20.0–28.0)
Bicarbonate: 36.8 mmol/L — ABNORMAL HIGH (ref 20.0–28.0)
Calcium, Ion: 1.34 mmol/L (ref 1.15–1.40)
Calcium, Ion: 1.32 mmol/L (ref 1.15–1.40)
Calcium, Ion: 1.3 mmol/L (ref 1.15–1.40)
HCT: 40 % (ref 39.0–52.0)
HCT: 42 % (ref 39.0–52.0)
HCT: 41 % (ref 39.0–52.0)
Hemoglobin: 13.6 g/dL (ref 13.0–17.0)
Hemoglobin: 14.3 g/dL (ref 13.0–17.0)
Hemoglobin: 13.9 g/dL (ref 13.0–17.0)
O2 Saturation: 85 %
O2 Saturation: 70 %
O2 Saturation: 93 %
Patient temperature: 102.4
Patient temperature: 103.3
Potassium: 5 mmol/L (ref 3.5–5.1)
Potassium: 5.3 mmol/L — ABNORMAL HIGH (ref 3.5–5.1)
Potassium: 5.4 mmol/L — ABNORMAL HIGH (ref 3.5–5.1)
Sodium: 153 mmol/L — ABNORMAL HIGH (ref 135–145)
Sodium: 153 mmol/L — ABNORMAL HIGH (ref 135–145)
Sodium: 154 mmol/L — ABNORMAL HIGH (ref 135–145)
TCO2: 40 mmol/L — ABNORMAL HIGH (ref 22–32)
TCO2: 41 mmol/L — ABNORMAL HIGH (ref 22–32)
TCO2: 39 mmol/L — ABNORMAL HIGH (ref 22–32)
pCO2 arterial: 96.1 mmHg (ref 32.0–48.0)
pCO2 arterial: 92.9 mmHg (ref 32.0–48.0)
pCO2 arterial: 94.1 mmHg (ref 32.0–48.0)
pH, Arterial: 7.208 — ABNORMAL LOW (ref 7.350–7.450)
pH, Arterial: 7.22 — ABNORMAL LOW (ref 7.350–7.450)
pH, Arterial: 7.214 — ABNORMAL LOW (ref 7.350–7.450)
pO2, Arterial: 73 mmHg — ABNORMAL LOW (ref 83.0–108.0)
pO2, Arterial: 47 mmHg — ABNORMAL LOW (ref 83.0–108.0)
pO2, Arterial: 99 mmHg (ref 83.0–108.0)

## 2019-12-09 LAB — URINALYSIS, ROUTINE W REFLEX MICROSCOPIC
Bilirubin Urine: NEGATIVE
Glucose, UA: NEGATIVE mg/dL
Ketones, ur: NEGATIVE mg/dL
Nitrite: NEGATIVE
Protein, ur: 30 mg/dL — AB
Specific Gravity, Urine: 1.016 (ref 1.005–1.030)
pH: 5 (ref 5.0–8.0)

## 2019-12-09 LAB — COMPREHENSIVE METABOLIC PANEL
ALT: 37 U/L (ref 0–44)
AST: 50 U/L — ABNORMAL HIGH (ref 15–41)
Albumin: 2.8 g/dL — ABNORMAL LOW (ref 3.5–5.0)
Alkaline Phosphatase: 29 U/L — ABNORMAL LOW (ref 38–126)
Anion gap: 6 (ref 5–15)
BUN: 77 mg/dL — ABNORMAL HIGH (ref 6–20)
CO2: 34 mmol/L — ABNORMAL HIGH (ref 22–32)
Calcium: 8.8 mg/dL — ABNORMAL LOW (ref 8.9–10.3)
Chloride: 115 mmol/L — ABNORMAL HIGH (ref 98–111)
Creatinine, Ser: 2.71 mg/dL — ABNORMAL HIGH (ref 0.61–1.24)
GFR calc Af Amer: 33 mL/min — ABNORMAL LOW (ref >60)
GFR calc non Af Amer: 29 mL/min — ABNORMAL LOW (ref >60)
Glucose, Bld: 143 mg/dL — ABNORMAL HIGH (ref 70–99)
Potassium: 5 mmol/L (ref 3.5–5.1)
Sodium: 155 mmol/L — ABNORMAL HIGH (ref 135–145)
Total Bilirubin: 1.3 mg/dL — ABNORMAL HIGH (ref 0.3–1.2)
Total Protein: 6.5 g/dL (ref 6.5–8.1)

## 2019-12-09 LAB — CBC WITH DIFFERENTIAL/PLATELET
Abs Immature Granulocytes: 0.08 10*3/uL — ABNORMAL HIGH (ref 0.00–0.07)
Basophils Absolute: 0 10*3/uL (ref 0.0–0.1)
Basophils Relative: 0 %
Eosinophils Absolute: 0.1 10*3/uL (ref 0.0–0.5)
Eosinophils Relative: 1 %
HCT: 47.5 % (ref 39.0–52.0)
Hemoglobin: 12.6 g/dL — ABNORMAL LOW (ref 13.0–17.0)
Immature Granulocytes: 1 %
Lymphocytes Relative: 15 %
Lymphs Abs: 1.4 10*3/uL (ref 0.7–4.0)
MCH: 30.7 pg (ref 26.0–34.0)
MCHC: 26.5 g/dL — ABNORMAL LOW (ref 30.0–36.0)
MCV: 115.9 fL — ABNORMAL HIGH (ref 80.0–100.0)
Monocytes Absolute: 0.9 10*3/uL (ref 0.1–1.0)
Monocytes Relative: 10 %
Neutro Abs: 6.6 10*3/uL (ref 1.7–7.7)
Neutrophils Relative %: 73 %
Platelets: 165 10*3/uL (ref 150–400)
RBC: 4.1 MIL/uL — ABNORMAL LOW (ref 4.22–5.81)
RDW: 13.2 % (ref 11.5–15.5)
WBC: 9.1 10*3/uL (ref 4.0–10.5)
nRBC: 0.2 % (ref 0.0–0.2)

## 2019-12-09 LAB — BASIC METABOLIC PANEL
Anion gap: 6 (ref 5–15)
BUN: 76 mg/dL — ABNORMAL HIGH (ref 6–20)
CO2: 33 mmol/L — ABNORMAL HIGH (ref 22–32)
Calcium: 8.8 mg/dL — ABNORMAL LOW (ref 8.9–10.3)
Chloride: 116 mmol/L — ABNORMAL HIGH (ref 98–111)
Creatinine, Ser: 2.7 mg/dL — ABNORMAL HIGH (ref 0.61–1.24)
GFR calc Af Amer: 34 mL/min — ABNORMAL LOW (ref >60)
GFR calc non Af Amer: 29 mL/min — ABNORMAL LOW (ref >60)
Glucose, Bld: 212 mg/dL — ABNORMAL HIGH (ref 70–99)
Potassium: 5.5 mmol/L — ABNORMAL HIGH (ref 3.5–5.1)
Sodium: 155 mmol/L — ABNORMAL HIGH (ref 135–145)

## 2019-12-09 LAB — GLUCOSE, CAPILLARY
Glucose-Capillary: 120 mg/dL — ABNORMAL HIGH (ref 70–99)
Glucose-Capillary: 107 mg/dL — ABNORMAL HIGH (ref 70–99)
Glucose-Capillary: 229 mg/dL — ABNORMAL HIGH (ref 70–99)
Glucose-Capillary: 197 mg/dL — ABNORMAL HIGH (ref 70–99)
Glucose-Capillary: 146 mg/dL — ABNORMAL HIGH (ref 70–99)
Glucose-Capillary: 131 mg/dL — ABNORMAL HIGH (ref 70–99)

## 2019-12-09 LAB — C-REACTIVE PROTEIN: CRP: 1 mg/dL — ABNORMAL HIGH (ref <1.0)

## 2019-12-09 LAB — MAGNESIUM: Magnesium: 2.8 mg/dL — ABNORMAL HIGH (ref 1.7–2.4)

## 2019-12-09 LAB — ECHOCARDIOGRAM COMPLETE
Height: 67 in
Weight: 6768 oz

## 2019-12-09 LAB — FERRITIN: Ferritin: 631 ng/mL — ABNORMAL HIGH (ref 24–336)

## 2019-12-09 LAB — D-DIMER, QUANTITATIVE: D-Dimer, Quant: 2.16 ug/mL-FEU — ABNORMAL HIGH (ref 0.00–0.50)

## 2019-12-09 LAB — PHOSPHORUS: Phosphorus: 4.6 mg/dL (ref 2.5–4.6)

## 2019-12-09 LAB — LACTIC ACID, PLASMA: Lactic Acid, Venous: 1.6 mmol/L (ref 0.5–1.9)

## 2019-12-09 MED ORDER — NOREPINEPHRINE 4 MG/250ML-% IV SOLN
INTRAVENOUS | Status: AC
  Administered 2019-12-09: 4 mg via INTRAVENOUS
  Filled 2019-12-09: qty 250

## 2019-12-09 MED ORDER — FENTANYL BOLUS VIA INFUSION
50.0000 ug | INTRAVENOUS | Status: DC | PRN
  Administered 2019-12-12 – 2019-12-23 (×20): 50 ug via INTRAVENOUS
  Filled 2019-12-09: qty 50

## 2019-12-09 MED ORDER — SODIUM CHLORIDE 0.9 % IV SOLN
0.0000 ug/kg/min | INTRAVENOUS | Status: DC
  Administered 2019-12-09: 2 ug/kg/min via INTRAVENOUS
  Administered 2019-12-09: 3 ug/kg/min via INTRAVENOUS
  Administered 2019-12-10 – 2019-12-11 (×4): 1.5 ug/kg/min via INTRAVENOUS
  Administered 2019-12-12: 3 ug/kg/min via INTRAVENOUS
  Administered 2019-12-12 (×2): 6 ug/kg/min via INTRAVENOUS
  Administered 2019-12-12: 4 ug/kg/min via INTRAVENOUS
  Administered 2019-12-13: 8.5 ug/kg/min via INTRAVENOUS
  Administered 2019-12-13: 6 ug/kg/min via INTRAVENOUS
  Administered 2019-12-13: 8.5 ug/kg/min via INTRAVENOUS
  Administered 2019-12-13: 5 ug/kg/min via INTRAVENOUS
  Administered 2019-12-13 (×2): 8.5 ug/kg/min via INTRAVENOUS
  Administered 2019-12-13: 5 ug/kg/min via INTRAVENOUS
  Administered 2019-12-14: 8 ug/kg/min via INTRAVENOUS
  Administered 2019-12-14: 10 ug/kg/min via INTRAVENOUS
  Administered 2019-12-14 (×2): 8.5 ug/kg/min via INTRAVENOUS
  Administered 2019-12-14: 8 ug/kg/min via INTRAVENOUS
  Administered 2019-12-14 (×2): 8.5 ug/kg/min via INTRAVENOUS
  Administered 2019-12-14: 9.5 ug/kg/min via INTRAVENOUS
  Administered 2019-12-14: 10 ug/kg/min via INTRAVENOUS
  Administered 2019-12-14: 9 ug/kg/min via INTRAVENOUS
  Administered 2019-12-14: 20 ug/kg/min via INTRAVENOUS
  Administered 2019-12-15: 7.5 ug/kg/min via INTRAVENOUS
  Administered 2019-12-15: 3 ug/kg/min via INTRAVENOUS
  Administered 2019-12-15: 8.5 ug/kg/min via INTRAVENOUS
  Administered 2019-12-15 (×2): 3 ug/kg/min via INTRAVENOUS
  Administered 2019-12-15: 9 ug/kg/min via INTRAVENOUS
  Administered 2019-12-16 – 2019-12-18 (×12): 3 ug/kg/min via INTRAVENOUS
  Administered 2019-12-19 – 2019-12-20 (×5): 3.5 ug/kg/min via INTRAVENOUS
  Administered 2019-12-20 (×3): 4 ug/kg/min via INTRAVENOUS
  Filled 2019-12-09 (×69): qty 20

## 2019-12-09 MED ORDER — CISATRACURIUM BOLUS VIA INFUSION
10.0000 mg | Freq: Once | INTRAVENOUS | Status: AC
  Administered 2019-12-09: 10 mg via INTRAVENOUS
  Filled 2019-12-09: qty 10

## 2019-12-09 MED ORDER — FREE WATER
300.0000 mL | Status: DC
  Administered 2019-12-09 – 2019-12-10 (×5): 300 mL

## 2019-12-09 MED ORDER — FENTANYL CITRATE (PF) 100 MCG/2ML IJ SOLN
50.0000 ug | Freq: Once | INTRAMUSCULAR | Status: AC
  Administered 2019-12-09: 50 ug via INTRAVENOUS

## 2019-12-09 MED ORDER — LINEZOLID 600 MG/300ML IV SOLN
600.0000 mg | Freq: Two times a day (BID) | INTRAVENOUS | Status: AC
  Administered 2019-12-09 – 2019-12-15 (×13): 600 mg via INTRAVENOUS
  Filled 2019-12-09 (×14): qty 300

## 2019-12-09 MED ORDER — MIDAZOLAM BOLUS VIA INFUSION
1.0000 mg | INTRAVENOUS | Status: DC | PRN
  Administered 2019-12-10 – 2019-12-23 (×13): 2 mg via INTRAVENOUS
  Filled 2019-12-09: qty 2

## 2019-12-09 MED ORDER — SODIUM CHLORIDE 0.9 % IV SOLN
2.0000 g | Freq: Three times a day (TID) | INTRAVENOUS | Status: DC
  Administered 2019-12-10 – 2019-12-12 (×8): 2 g via INTRAVENOUS
  Filled 2019-12-09 (×8): qty 2

## 2019-12-09 MED ORDER — ARTIFICIAL TEARS OPHTHALMIC OINT
1.0000 "application " | TOPICAL_OINTMENT | Freq: Three times a day (TID) | OPHTHALMIC | Status: DC
  Administered 2019-12-09 – 2019-12-21 (×35): 1 via OPHTHALMIC
  Filled 2019-12-09 (×6): qty 3.5

## 2019-12-09 MED ORDER — SODIUM CHLORIDE 0.9 % IV SOLN: 600.0000 mg | Freq: Two times a day (BID) | INTRAVENOUS | Status: DC

## 2019-12-09 MED ORDER — SODIUM CHLORIDE 0.9 % IV SOLN
2.0000 g | Freq: Three times a day (TID) | INTRAVENOUS | Status: DC
  Administered 2019-12-09 (×2): 2 g via INTRAVENOUS
  Filled 2019-12-09 (×2): qty 2

## 2019-12-09 MED ORDER — PERFLUTREN LIPID MICROSPHERE
1.0000 mL | INTRAVENOUS | Status: AC | PRN
  Administered 2019-12-09: 3 mL via INTRAVENOUS
  Filled 2019-12-09: qty 10

## 2019-12-09 MED ORDER — MIDAZOLAM 50MG/50ML (1MG/ML) PREMIX INFUSION
2.0000 mg/h | INTRAVENOUS | Status: DC
  Administered 2019-12-09 – 2019-12-10 (×8): 10 mg/h via INTRAVENOUS
  Administered 2019-12-11: 9 mg/h via INTRAVENOUS
  Administered 2019-12-11: 10 mg/h via INTRAVENOUS
  Administered 2019-12-11 (×2): 9 mg/h via INTRAVENOUS
  Administered 2019-12-11 – 2019-12-12 (×4): 10 mg/h via INTRAVENOUS
  Administered 2019-12-12: 9 mg/h via INTRAVENOUS
  Administered 2019-12-13 – 2019-12-20 (×39): 10 mg/h via INTRAVENOUS
  Administered 2019-12-21: 8 mg/h via INTRAVENOUS
  Administered 2019-12-21: 3 mg/h via INTRAVENOUS
  Administered 2019-12-21: 4 mg/h via INTRAVENOUS
  Administered 2019-12-22: 3 mg/h via INTRAVENOUS
  Administered 2019-12-23 – 2019-12-24 (×2): 2 mg/h via INTRAVENOUS
  Administered 2019-12-24: 4 mg/h via INTRAVENOUS
  Filled 2019-12-09 (×61): qty 50

## 2019-12-09 MED ORDER — FUROSEMIDE 10 MG/ML IJ SOLN
40.0000 mg | Freq: Two times a day (BID) | INTRAMUSCULAR | Status: AC
  Administered 2019-12-09 (×2): 40 mg via INTRAVENOUS
  Filled 2019-12-09 (×2): qty 4

## 2019-12-09 MED ORDER — ALBUMIN HUMAN 25 % IV SOLN
25.0000 g | Freq: Once | INTRAVENOUS | Status: AC
  Administered 2019-12-09: 25 g via INTRAVENOUS
  Filled 2019-12-09: qty 100

## 2019-12-09 MED ORDER — SODIUM CHLORIDE 0.9 % IV SOLN: INTRAVENOUS | Status: DC | PRN

## 2019-12-09 MED ORDER — FENTANYL 2500MCG IN NS 250ML (10MCG/ML) PREMIX INFUSION
50.0000 ug/h | INTRAVENOUS | Status: DC
  Administered 2019-12-09 – 2019-12-14 (×15): 300 ug/h via INTRAVENOUS
  Administered 2019-12-14: 325 ug/h via INTRAVENOUS
  Administered 2019-12-14: 300 ug/h via INTRAVENOUS
  Administered 2019-12-15 – 2019-12-20 (×22): 400 ug/h via INTRAVENOUS
  Administered 2019-12-21: 250 ug/h via INTRAVENOUS
  Administered 2019-12-21: 300 ug/h via INTRAVENOUS
  Administered 2019-12-21: 350 ug/h via INTRAVENOUS
  Administered 2019-12-22: 250 ug/h via INTRAVENOUS
  Administered 2019-12-22 – 2019-12-23 (×2): 225 ug/h via INTRAVENOUS
  Administered 2019-12-23 – 2019-12-24 (×2): 250 ug/h via INTRAVENOUS
  Administered 2019-12-24: 150 ug/h via INTRAVENOUS
  Filled 2019-12-09 (×51): qty 250

## 2019-12-09 NOTE — Procedures (Signed)
Central Venous Catheter Insertion Procedure Note Clifford Nichols [id number] 1981/06/26  Procedure: Insertion of Central Venous Catheter Indications: Assessment of intravascular volume, Drug and/or fluid administration and Frequent blood sampling  Procedure Details Consent: Risks of procedure as well as the alternatives and risks of each were explained to the (patient/caregiver).  Consent for procedure obtained. Time Out: Verified patient identification, verified procedure, site/side was marked, verified correct patient position, special equipment/implants available, medications/allergies/relevent history reviewed, required imaging and test results available.  Performed  Maximum sterile technique was used including antiseptics, cap, gloves, gown, hand hygiene, mask and sheet. Skin prep: Chlorhexidine; local anesthetic administered A antimicrobial bonded/coated triple lumen catheter was placed in the left internal jugular vein using the Seldinger technique.  Evaluation Blood flow good Complications: No apparent complications Patient did tolerate procedure well. Chest X-ray ordered to verify placement.  CXR: pending.  Procedure performed under direct ultrasound guidance for real time vessel cannulation.      Rutherford Guys, [place] [facility] 12/09/2019, 9:46 PM

## 2019-12-09 NOTE — Progress Notes (Signed)
1930- Patients MAP less then 60, SBP 86-92, call to [facility] made aware. No twitches noted with TO$, nimbex on hold for 15 mins.

## 2019-12-09 NOTE — Progress Notes (Signed)
Assisted tele visit to patient with family member.  Geovany Trudo M, RN  

## 2019-12-09 NOTE — Progress Notes (Signed)
Assisted tele visit to patient with family member.  Clifford Nichols M, RN  

## 2019-12-09 NOTE — Progress Notes (Signed)
PHARMACY - PHYSICIAN COMMUNICATION CRITICAL VALUE ALERT - BLOOD CULTURE IDENTIFICATION (BCID)  Clifford Nichols is an 39 y.o. male who presented to [facility] on 12/04/2019 with a chief complaint of COVID  Assessment:  Pt has been here for severe COVID. Lab called with BCID result of 1/4 bottles with staph species likely to be contaminant CNS. Linezolid was already added before the call for severe sepsis.   Name of physician (or Provider) Contacted: Dr Warrick Parisian  Current antibiotics: Cefepime/linezolid  Changes to prescribed antibiotics recommended:  Continue cefepime and linezolid  Results for orders placed or performed during the hospital encounter of 12/04/19  Blood Culture ID Panel (Reflexed) (Collected: 12/09/2019 12:50 AM)  Result Value Ref Range   Enterococcus species NOT DETECTED NOT DETECTED   Listeria monocytogenes NOT DETECTED NOT DETECTED   Staphylococcus species DETECTED (A) NOT DETECTED   Staphylococcus aureus (BCID) NOT DETECTED NOT DETECTED   Methicillin resistance NOT DETECTED NOT DETECTED   Streptococcus species NOT DETECTED NOT DETECTED   Streptococcus agalactiae NOT DETECTED NOT DETECTED   Streptococcus pneumoniae NOT DETECTED NOT DETECTED   Streptococcus pyogenes NOT DETECTED NOT DETECTED   Acinetobacter baumannii NOT DETECTED NOT DETECTED   Enterobacteriaceae species NOT DETECTED NOT DETECTED   Enterobacter cloacae complex NOT DETECTED NOT DETECTED   Escherichia coli NOT DETECTED NOT DETECTED   Klebsiella oxytoca NOT DETECTED NOT DETECTED   Klebsiella pneumoniae NOT DETECTED NOT DETECTED   Proteus species NOT DETECTED NOT DETECTED   Serratia marcescens NOT DETECTED NOT DETECTED   Haemophilus influenzae NOT DETECTED NOT DETECTED   Neisseria meningitidis NOT DETECTED NOT DETECTED   Pseudomonas aeruginosa NOT DETECTED NOT DETECTED   Candida albicans NOT DETECTED NOT DETECTED   Candida glabrata NOT DETECTED NOT DETECTED   Candida krusei NOT DETECTED NOT DETECTED    Candida parapsilosis NOT DETECTED NOT DETECTED   Candida tropicalis NOT DETECTED NOT DETECTED    Ulyses Southward, PharmD, BCIDP, AAHIVP, CPP Infectious Disease Pharmacist 12/09/2019 9:02 PM

## 2019-12-09 NOTE — Consult Note (Signed)
      ECMO TEAM CONSULT NOTE  Patient's case reviewed and discussed in multi-discplinary fashion. Unfortunately, patient not felt to be candidate for ECMO support at this time due to markedly elevated BMI, AKI and low potential for successful physical rehabilitation after prolonged ECMO pump run.  Thank you for the consult. We remain available if we can help in any way.   Dr. Delton Coombes aware.  Arvilla Meres, MD  10:08 PM

## 2019-12-09 NOTE — Progress Notes (Signed)
Pt with low spo2 despite full vent support. Pt put in a recruitment maneuver on ventilator (PC 30/12/14/100 3.0 itime) for 2 minutes. Pt spo2 improved to 90% temporarily but eventually decreased again to 83%. Pt in no distress. No increased WOB. MD made aware

## 2019-12-09 NOTE — Progress Notes (Signed)
Assisted tele visit to patient with family member.  Averie Hornbaker M, RN  

## 2019-12-09 NOTE — Procedures (Signed)
Arterial Catheter Insertion Procedure Note Clifford Nichols [id number] 1981/05/30  Procedure: Insertion of Arterial Catheter  Indications: Blood pressure monitoring and Frequent blood sampling  Procedure Details Consent: Unable to obtain consent because of emergent medical necessity. Time Out: Verified patient identification, verified procedure, site/side was marked, verified correct patient position, special equipment/implants available, medications/allergies/relevent history reviewed, required imaging and test results available.  Performed  Maximum sterile technique was used including antiseptics, cap, gloves, gown, hand hygiene, mask and sheet. Skin prep: Chlorhexidine; local anesthetic administered 20 gauge catheter was inserted into right radial artery using the Seldinger technique. ULTRASOUND GUIDANCE USED: NO Evaluation Blood flow good; BP tracing good. Complications: No apparent complications.   Carolan Shiver 12/09/2019

## 2019-12-09 NOTE — Progress Notes (Signed)
Pharmacy Antibiotic Note  Clifford Nichols is a 39 y.o. male admitted on 12/04/2019 with COVID pneumonia.  Pharmacy has been consulted for Cefepime dosing.  Patient has completed Remdesivir for COVID.  Patient remains febrile up to 103.3. WBC is within normal limits but trending up.  CRP is down. SCr is trending down with improved CrCl.  5/12 Respiratory culture and MRSA pcr were negative.  New blood and sputum cultures are being sent today.   Pt continue to be febrile. His MRSA is neg. Dr. Warrick Parisian would like to broaden coverage for now. Instead of ceftaroline, we are going to add linezolid to cefepime instead.    Plan: Add linezolid 600mg  IV q12 Cefepime 2g IV every 8 hours.  Monitor culture results, clinical status, and renal function.   Height: 5\' 7"  (170.2 cm) Weight: (!) 191.9 kg (423 lb) IBW/kg (Calculated) : 66.1  Temp (24hrs), Avg:102.6 F (39.2 C), Min:101 F (38.3 C), Max:103.3 F (39.6 C)  Recent Labs  Lab 12/05/19 0500 12/05/19 0500 12/06/19 0450 12/07/19 0350 12/08/19 0500 12/09/19 0352 12/09/19 1715  WBC 5.8  --  5.2 5.5 6.1 9.1  --   CREATININE 2.99*   < > 2.65* 2.73* 3.17* 2.71* 2.70*  LATICACIDVEN  --   --   --   --   --   --  1.6   < > = values in this interval not displayed.    Estimated Creatinine Clearance: 62.3 mL/min (A) (by C-G formula based on SCr of 2.7 mg/dL (H)).    No Known Allergies  Antimicrobials this admission: Cefepime 5/17 >>  Dose adjustments this admission:   Microbiology results: 5/12 Resp culture - negative 5/12 MRSA PCR negative 5/17 Blood culture >> 5/17 Resp culture >>   6/17, PharmD, BCIDP, AAHIVP, CPP Infectious Disease Pharmacist 12/09/2019 8:47 PM

## 2019-12-09 NOTE — Progress Notes (Signed)
  Echocardiogram 2D Echocardiogram has been performed.  Clifford Nichols 12/09/2019, 5:12 PM

## 2019-12-09 NOTE — Progress Notes (Signed)
Pharmacy Antibiotic Note  Clifford Nichols is a 39 y.o. male admitted on 12/04/2019 with COVID pneumonia.  Pharmacy has been consulted for Cefepime dosing.  Patient has completed Remdesivir for COVID.  Patient remains febrile up to 103.3. WBC is within normal limits but trending up.  CRP is down. SCr is trending down with improved CrCl.  5/12 Respiratory culture and MRSA pcr were negative.  New blood and sputum cultures are being sent today.   Plan: Cefepime 2g IV every 8 hours.  Monitor culture results, clinical status, and renal function.   Height: 5\' 7"  (170.2 cm) Weight: (!) 191.9 kg (423 lb) IBW/kg (Calculated) : 66.1  Temp (24hrs), Avg:102.4 F (39.1 C), Min:101 F (38.3 C), Max:103.3 F (39.6 C)  Recent Labs  Lab 12/05/19 0500 12/06/19 0450 12/07/19 0350 12/08/19 0500 12/09/19 0352  WBC 5.8 5.2 5.5 6.1 9.1  CREATININE 2.99* 2.65* 2.73* 3.17* 2.71*    Estimated Creatinine Clearance: 62 mL/min (A) (by C-G formula based on SCr of 2.71 mg/dL (H)).    No Known Allergies  Antimicrobials this admission: Cefepime 5/17 >>  Dose adjustments this admission:   Microbiology results: 5/12 Resp culture - negative 5/12 MRSA PCR negative 5/17 Blood culture >> 5/17 Resp culture >>  Thank you for allowing pharmacy to be a part of this patient's care.  6/17, PharmD, BCPS, BCCCP Clinical Pharmacist Please refer to [facility] for [facility] Pharmacy numbers 12/09/2019 8:31 AM

## 2019-12-09 NOTE — Progress Notes (Addendum)
eLink Physician-Brief Progress Note Patient Name: Clifford Nichols DOB: Aug 25, 1980 MRN: [id number]   Date of Service  12/09/2019  HPI/Events of Note  Pt with multiple risk factors and severe Covid pneumonia , now with persistent lung infiltrates, acute hypoxemic respiratory failure, and high fever along with hypotension more recently, this raises concern for a new infection (possibly bacterial super-infection, cultures are pending, serum albumin is 2.8 gm.  eICU Interventions  Will broaden antibiotic coverage, will ask bedside to put in a central line so that his days old femoral line can be discontinued since it is high risk for infection, Pt was aggressively diuresed in the past 24 hours and soft blood pressures may reflect the diuresis, will give patient 25 gm of 25% albumin x 1, there has been mention of possible ECMO but no firm plans, he meets the disease severity criteria. Infectious disease has been consulted due to disease complexity.        Thomasene Lot Arvis Zwahlen 12/09/2019, 8:12 PM

## 2019-12-09 NOTE — Progress Notes (Addendum)
NAME:  Clifford Nichols, MRN:  [id number], DOB:  Sep 14, 1980, LOS: 5 [facility] ADMISSION DATE:  12/04/2019, [facility] Admission DATE:  12/04/19 REFERRING MD:  Dr Belia Heman, CCM, CHIEF COMPLAINT:  Acute respiratory failure   Brief History   39 year old obese man with hypertension, diabetes admitted with acute respiratory failure, ARDS from COVID-19 pneumonia, initial positive test 5/10   Past Medical History   Past Medical History:  Diagnosis Date  . Abscess of upper back excluding scapular region   . Diabetes mellitus without complication (HCC)   . GERD (gastroesophageal reflux disease)    OCC-NO MEDS  . Hypertension   . Sebaceous cyst     Significant Hospital Events   5/10 admitted [facility]  5/11 failed HHFNC, BiPAP.  Required intubation 5/12 transferred to [facility] 5/13 High PEEP and FiO2 requirement through the night Had continued issues with left proptosis, prone position deferred due to this  Consults:  Ophthalmology for proptosis 5/12  Procedures:  ETT 5/11 >>  L femoral CVC 5/11 >>   Significant Diagnostic Tests:  Head CT 5/11 >> no significant intracranial abnormality.  Scattered opacification and air-fluid levels in the frontal, ethmoid, sphenoid and maxillary sinuses of unclear significance. Head CT 5/12 >> no significant intracranial abnormality Lower extremity Doppler 5/14 >> negative for DVT  Micro Data:  SARS CoV2 5/10 >> positive Respiratory 5/12 >> negative Blood 5/17 >> Respiratory 5/17 >>   Antimicrobials:  Remdesivir 5/11 >> 5/15 Dexamethasone 5/11 >> Tocilizumab 5/12 Cefepime 5/17 >>   Interim history/subjective:   Fever reported overnight, 102.7 F.  Blood and respiratory culture sent FiO2 1.00, PEEP 16 Currently on fentanyl 400, ketamine, midazolam  Na 155, slight improvement S Cr I/O+ 3.3 L total, urine output 1.67 L last 24 hours   Objective   Blood pressure 108/61, pulse (!) 118, temperature (!) 102.7 F (39.3 C), temperature source Oral, resp. rate (!) 32,  height 5\' 7"  (1.702 m), weight (!) 191.9 kg, SpO2 91 %.    Vent Mode: PRVC FiO2 (%):  [100 %] 100 % Set Rate:  [30 bmp-35 bmp] 35 bmp Vt Set:  [380 mL] 380 mL PEEP:  [16 cmH20] 16 cmH20 Plateau Pressure:  [33 cmH20-35 cmH20] 34 cmH20   Intake/Output Summary (Last 24 hours) at 12/09/2019 0825 Last data filed at 12/09/2019 0800 Gross per 24 hour  Intake 2732.08 ml  Output 2450 ml  Net 282.08 ml   Filed Weights   12/07/19 0344 12/08/19 0500 12/09/19 0500  Weight: (!) 155 kg (!) 185.5 kg (!) 191.9 kg    Examination: General: Morbidly obese man, sedated, ventilated HENT: Oral ETT, no pallor or icterus mild proptosis noted Lungs: Very distant, coarse, no wheeze Cardiovascular: Regular, distant, no murmur Abdomen: Obese, nondistended, positive bowel sounds Extremities: trace edema in lower extrem. 2+ pulses Neuro: Sedated. PERRL, no dolls eyes (proptosis though), + gag reflex  RASS -4 GU: Foley catheter in place, urine clear   Resolved Hospital Problem list    Left proptosis, noted with coughing early 5/12, resolved.  Apparently per wife he has episodic intermittent proptosis at home as well. Head CT 5/12 reassuring Ophthalmology did evaluate, recommended that he will need outpatient evaluation but no intervention at this time beyond keeping the eyes moist.  Assessment & Plan:   ARDS due to COVID-19 pneumonia  Morbid obesity, possible OSA.  Will certainly impact respiratory mechanics Mechanical ventilation via ARDS protocol, target PRVC 6 cc/kg Wean PEEP and FiO2 as able Goal plateau pressure less than 30,  driving pressure less than 15 Paralytics if necessary for vent synchrony, gas exchange Cycle prone positioning if necessary for oxygenation.  Deferred initially given his proptosis Deep sedation per PAD protocol, goal RASS -4, currently fentanyl, Versed, ketamine infusions Diuresis as blood pressure and renal function can tolerate, goal CVP 5-8 Given her lack of progress  over 6 days, consider utility of ECMO. VAP prevention order set Remdesivir, completed 5 days Dexamethasone plan for 10 days Tocilizumab given on 5/12 Vitamin C, zinc as ordered   Acute toxic metabolic encephalopathy, need for sedation Continue deep sedation per PAD protocol, goal RASS -4, currently fentanyl, midazolam.  Ketamine drip as ordered Enteral clonazepam, oxycodone  Severe sepsis, fever.  Suspect due to COVID-19 pneumonia.  Consider superimposed opportunistic bacterial infection. Follow blood cultures, repeat respiratory culture 5/17 Add empiric cefepime 5/17 and tailor to culture results  AKI, slight improvement 5/17 Follow urine output, BMP Ensure adequate renal perfusion Avoid nephrotoxins. Restart diuresis on 3/17, follow urine output and renal function closely  Hypernatremia Increased free water on 5/17, follow BMP  Diabetes mellitus Sliding scale insulin as per protocol    Best practice:  Diet: resume TF Pain/Anxiety/Delirium protocol (if indicated): 5/11 > fentanyl midazolam ketamine VAP protocol (if indicated): 5/11 DVT prophylaxis: enoxaparin  GI prophylaxis: pepcid Glucose control: SSI protocol Mobility: BR Code Status: Full Family Communication: Updated pt's mother by phone today 5/17. Broached the subject of possible ECMO.   Disposition: ICU   The patient is critically ill with multiple organ systems failure and requires high complexity decision making for assessment and support, frequent evaluation and titration of therapies, application of advanced monitoring technologies and extensive interpretation of multiple databases. Critical Care Time devoted to patient care services described in this note independent of APP/resident  time is 33 minutes.    Baltazar Apo, MD, PhD 12/09/2019, 8:41 AM [facility] [phone] or if no answer [phone]

## 2019-12-10 ENCOUNTER — Inpatient Hospital Stay (HOSPITAL_COMMUNITY): Payer: No Typology Code available for payment source

## 2019-12-10 LAB — POCT I-STAT 7, (LYTES, BLD GAS, ICA,H+H)
Acid-Base Excess: 6 mmol/L — ABNORMAL HIGH (ref 0.0–2.0)
Bicarbonate: 34.6 mmol/L — ABNORMAL HIGH (ref 20.0–28.0)
Calcium, Ion: 1.3 mmol/L (ref 1.15–1.40)
HCT: 35 % — ABNORMAL LOW (ref 39.0–52.0)
Hemoglobin: 11.9 g/dL — ABNORMAL LOW (ref 13.0–17.0)
O2 Saturation: 90 %
Patient temperature: 103.2
Potassium: 5.2 mmol/L — ABNORMAL HIGH (ref 3.5–5.1)
Sodium: 155 mmol/L — ABNORMAL HIGH (ref 135–145)
TCO2: 37 mmol/L — ABNORMAL HIGH (ref 22–32)
pCO2 arterial: 82.9 mmHg (ref 32.0–48.0)
pH, Arterial: 7.242 — ABNORMAL LOW (ref 7.350–7.450)
pO2, Arterial: 83 mmHg (ref 83.0–108.0)

## 2019-12-10 LAB — COMPREHENSIVE METABOLIC PANEL
ALT: 32 U/L (ref 0–44)
AST: 47 U/L — ABNORMAL HIGH (ref 15–41)
Albumin: 3 g/dL — ABNORMAL LOW (ref 3.5–5.0)
Alkaline Phosphatase: 25 U/L — ABNORMAL LOW (ref 38–126)
Anion gap: 8 (ref 5–15)
BUN: 87 mg/dL — ABNORMAL HIGH (ref 6–20)
CO2: 31 mmol/L (ref 22–32)
Calcium: 8.6 mg/dL — ABNORMAL LOW (ref 8.9–10.3)
Chloride: 116 mmol/L — ABNORMAL HIGH (ref 98–111)
Creatinine, Ser: 3.08 mg/dL — ABNORMAL HIGH (ref 0.61–1.24)
GFR calc Af Amer: 29 mL/min — ABNORMAL LOW (ref >60)
GFR calc non Af Amer: 25 mL/min — ABNORMAL LOW (ref >60)
Glucose, Bld: 146 mg/dL — ABNORMAL HIGH (ref 70–99)
Potassium: 5 mmol/L (ref 3.5–5.1)
Sodium: 155 mmol/L — ABNORMAL HIGH (ref 135–145)
Total Bilirubin: 1.4 mg/dL — ABNORMAL HIGH (ref 0.3–1.2)
Total Protein: 6.1 g/dL — ABNORMAL LOW (ref 6.5–8.1)

## 2019-12-10 LAB — GLUCOSE, CAPILLARY
Glucose-Capillary: 136 mg/dL — ABNORMAL HIGH (ref 70–99)
Glucose-Capillary: 147 mg/dL — ABNORMAL HIGH (ref 70–99)
Glucose-Capillary: 197 mg/dL — ABNORMAL HIGH (ref 70–99)
Glucose-Capillary: 275 mg/dL — ABNORMAL HIGH (ref 70–99)
Glucose-Capillary: 188 mg/dL — ABNORMAL HIGH (ref 70–99)
Glucose-Capillary: 133 mg/dL — ABNORMAL HIGH (ref 70–99)

## 2019-12-10 LAB — CBC
HCT: 43.4 % (ref 39.0–52.0)
Hemoglobin: 11.2 g/dL — ABNORMAL LOW (ref 13.0–17.0)
MCH: 29.7 pg (ref 26.0–34.0)
MCHC: 25.8 g/dL — ABNORMAL LOW (ref 30.0–36.0)
MCV: 115.1 fL — ABNORMAL HIGH (ref 80.0–100.0)
Platelets: 130 10*3/uL — ABNORMAL LOW (ref 150–400)
RBC: 3.77 MIL/uL — ABNORMAL LOW (ref 4.22–5.81)
RDW: 13.4 % (ref 11.5–15.5)
WBC: 10.1 10*3/uL (ref 4.0–10.5)
nRBC: 0.4 % — ABNORMAL HIGH (ref 0.0–0.2)

## 2019-12-10 LAB — BASIC METABOLIC PANEL
Anion gap: 7 (ref 5–15)
BUN: 90 mg/dL — ABNORMAL HIGH (ref 6–20)
CO2: 34 mmol/L — ABNORMAL HIGH (ref 22–32)
Calcium: 8.7 mg/dL — ABNORMAL LOW (ref 8.9–10.3)
Chloride: 114 mmol/L — ABNORMAL HIGH (ref 98–111)
Creatinine, Ser: 3.05 mg/dL — ABNORMAL HIGH (ref 0.61–1.24)
GFR calc Af Amer: 29 mL/min — ABNORMAL LOW (ref >60)
GFR calc non Af Amer: 25 mL/min — ABNORMAL LOW (ref >60)
Glucose, Bld: 326 mg/dL — ABNORMAL HIGH (ref 70–99)
Potassium: 6.4 mmol/L (ref 3.5–5.1)
Sodium: 155 mmol/L — ABNORMAL HIGH (ref 135–145)

## 2019-12-10 LAB — MAGNESIUM: Magnesium: 2.4 mg/dL (ref 1.7–2.4)

## 2019-12-10 LAB — INTERLEUKIN-6, SERUM  IL6SL
Interleukin-6, Serum: 275.9 pg/mL — ABNORMAL HIGH (ref 0.0–13.0)
Interleukin-6, Serum: 416.4 pg/mL — ABNORMAL HIGH (ref 0.0–13.0)
Interleukin-6, Serum: 593.4 pg/mL — ABNORMAL HIGH (ref 0.0–13.0)

## 2019-12-10 LAB — PHOSPHORUS: Phosphorus: 3.4 mg/dL (ref 2.5–4.6)

## 2019-12-10 MED ORDER — SODIUM POLYSTYRENE SULFONATE 15 GM/60ML PO SUSP
30.0000 g | Freq: Two times a day (BID) | ORAL | Status: DC
  Administered 2019-12-10 – 2019-12-12 (×4): 30 g
  Filled 2019-12-10 (×4): qty 120

## 2019-12-10 MED ORDER — FUROSEMIDE 10 MG/ML IJ SOLN: 40.0000 mg | Freq: Two times a day (BID) | INTRAMUSCULAR | Status: DC

## 2019-12-10 MED ORDER — SODIUM CHLORIDE 0.9% FLUSH
10.0000 mL | Freq: Two times a day (BID) | INTRAVENOUS | Status: DC
  Administered 2019-12-10 – 2020-01-10 (×56): 10 mL

## 2019-12-10 MED ORDER — FREE WATER
400.0000 mL | Status: DC
  Administered 2019-12-10 – 2019-12-11 (×7): 400 mL

## 2019-12-10 MED ORDER — NOREPINEPHRINE 4 MG/250ML-% IV SOLN
0.0000 ug/min | INTRAVENOUS | Status: DC
  Administered 2019-12-10 – 2019-12-11 (×2): 2 ug/min via INTRAVENOUS
  Filled 2019-12-10: qty 250

## 2019-12-10 MED ORDER — SODIUM ZIRCONIUM CYCLOSILICATE 10 G PO PACK
10.0000 g | PACK | Freq: Once | ORAL | Status: AC
  Administered 2019-12-10: 10 g
  Filled 2019-12-10: qty 1

## 2019-12-10 MED ORDER — SODIUM CHLORIDE 0.9% FLUSH: 10.0000 mL | INTRAVENOUS | Status: DC | PRN

## 2019-12-10 MED ORDER — FUROSEMIDE 10 MG/ML IJ SOLN
40.0000 mg | Freq: Once | INTRAMUSCULAR | Status: AC
  Administered 2019-12-10: 40 mg via INTRAVENOUS
  Filled 2019-12-10: qty 4

## 2019-12-10 NOTE — Progress Notes (Signed)
MD made aware of chest x-ray results. Decision made to leave pt in supine position at this time. Will continue to monitor.

## 2019-12-10 NOTE — Progress Notes (Signed)
Patient placed in prone position.  ETT secured with cloth tape at 25 cm at the lip.  Patient tolerated well with no complications.  

## 2019-12-10 NOTE — Progress Notes (Signed)
eLink Physician-Brief Progress Note Patient Name: Clifford Nichols DOB: 06/27/1981 MRN: [id number]   Date of Service  12/10/2019  HPI/Events of Note  Pt with several watery stools, RN requesting Flexiseal to manage diarrhea.  eICU Interventions  Flexiseal ordered.        [name] 12/10/2019, 11:21 PM

## 2019-12-10 NOTE — Progress Notes (Addendum)
CRITICAL VALUE ALERT  Critical Value: Potassium 6.4  Date & Time Notied:  5/18 1710  Provider Notified: Dr. Delton Coombes  Orders Received/Actions taken:MD to place new orders. Awaiting orders.

## 2019-12-10 NOTE — Progress Notes (Signed)
Assisted tele visit to patient with wife.  Thomas, Allexus Ovens Renee, RN   

## 2019-12-10 NOTE — Progress Notes (Signed)
Assisted tele visit to patient with wife.  Thomas, Abdiel Blackerby Renee, RN   

## 2019-12-10 NOTE — Progress Notes (Signed)
Patients HR up to 130's, PO2 sat down 82-86%, call to RT and call to [facility]. Lung sounds unchanged, suctioned patient from moderate amount of clear white.

## 2019-12-10 NOTE — Progress Notes (Addendum)
NAME:  Clifford Nichols, MRN:  [id number], DOB:  1981/07/25, LOS: 6 [facility] ADMISSION DATE:  12/04/2019, [facility] Admission DATE:  12/04/19 REFERRING MD:  Dr Belia Heman, CCM, CHIEF COMPLAINT:  Acute respiratory failure   Brief History   39 year old obese man with hypertension, diabetes admitted with acute respiratory failure, ARDS from COVID-19 pneumonia, initial positive test 5/10   Past Medical History   Past Medical History:  Diagnosis Date  . Abscess of upper back excluding scapular region   . Diabetes mellitus without complication (HCC)   . GERD (gastroesophageal reflux disease)    OCC-NO MEDS  . Hypertension   . Sebaceous cyst     Significant Hospital Events   5/10 admitted [facility]  5/11 failed HHFNC, BiPAP.  Required intubation 5/12 transferred to [facility] 5/13 High PEEP and FiO2 requirement through the night Had continued issues with left proptosis, prone position deferred due to this  Consults:  Ophthalmology for proptosis 5/12  Procedures:  ETT 5/11 >>  L femoral CVC 5/11 >> 5/17 L IJ CVC 5/17 >>  R radial art line 5/17 >>    Significant Diagnostic Tests:  Head CT 5/11 >> no significant intracranial abnormality.  Scattered opacification and air-fluid levels in the frontal, ethmoid, sphenoid and maxillary sinuses of unclear significance. Head CT 5/12 >> no significant intracranial abnormality Lower extremity Doppler 5/14 >> negative for DVT  Micro Data:  SARS CoV2 5/10 >> positive Respiratory 5/12 >> negative Blood 5/17 >> GPC 1 of 2 >>  Respiratory 5/17 >> GPC clusters >>   Antimicrobials:  Remdesivir 5/11 >> 5/15 Dexamethasone 5/11 >> Tocilizumab 5/12 Cefepime 5/17 >> Linezolid 5/17 >>   Interim history/subjective:   Empiric cefepime added 5/17. Linezolid added later in the day after one of two blood cultures GPC Appreciate ECMO team evaluation. Deemed to be a poor candidate at this time Sedation fentanyl 300, ketamine 0.5, midazolam 10, paralytic sedated on  5/18 Received albumin overnight for relative hypotension, resolved fio2 1.00, PEEP 20 I/O +4.7 L total Sodium remains elevated 155, serum creatinine 2.7 >> 3.08   Objective   Blood pressure 106/60, pulse (!) 121, temperature (!) 103.2 F (39.6 C), temperature source Oral, resp. rate (!) 31, height 5\' 7"  (1.702 m), weight (!) 184.6 kg, SpO2 93 %.    Vent Mode: PRVC FiO2 (%):  [100 %] 100 % Set Rate:  [35 bmp] 35 bmp Vt Set:  [380 mL] 380 mL PEEP:  [16 cmH20-22 cmH20] 20 cmH20 Plateau Pressure:  [34 cmH20-37 cmH20] 34 cmH20   Intake/Output Summary (Last 24 hours) at 12/10/2019 0532 Last data filed at 12/10/2019 0500 Gross per 24 hour  Intake 3766.71 ml  Output 2200 ml  Net 1566.71 ml   Filed Weights   12/08/19 0500 12/09/19 0500 12/10/19 0431  Weight: (!) 185.5 kg (!) 191.9 kg (!) 184.6 kg    Examination: General: Morbidly obese man, sedated, ventilated HENT: Oral ETT, no pallor or icterus mild proptosis noted Lungs: Very distant, coarse, no wheeze Cardiovascular: Regular, distant, no murmur Abdomen: Obese, nondistended, positive bowel sounds Extremities: trace edema in lower extrem. 2+ pulses Neuro: Sedated. PERRL, no dolls eyes (proptosis though), + gag reflex RASS -5 GU: Foley catheter in place, urine clear   Resolved Hospital Problem list    Left proptosis, noted with coughing early 5/12, resolved.  Apparently per wife he has episodic intermittent proptosis at home as well. Head CT 5/12 reassuring Ophthalmology did evaluate, recommended that he will need outpatient evaluation but no intervention  at this time beyond keeping the eyes moist.  Assessment & Plan:   ARDS due to COVID-19 pneumonia  Morbid obesity, possible OSA.  Will certainly impact respiratory mechanics Mechanical ventilation via ARDS protocol, target PRVC 6 cc/kg Wean PEEP and FiO2 as able Goal plateau pressure less than 30, driving pressure less than 15 Paralytics ordered 5/17 for vent synchrony,  gas exchange Cycle prone positioning if necessary for oxygenation.  Deferred initially given his proptosis. We will try to initiate on 5/18 Deep sedation per PAD protocol, goal RASS -4, currently fentanyl, Versed, ketamine infusions. May be able to discontinue the ketamine on 5/18 Diuresis as blood pressure and renal function can tolerate, goal CVP 5-8 Discussed possible ECMO with multidisciplinary team. Felt to be a poor candidate at this time 5/17 VAP prevention order set Remdesivir, completed 5 days Dexamethasone plan for 10 days Tocilizumab given on 5/12 Vitamin C, zinc as ordered   Acute toxic metabolic encephalopathy, need for sedation Continue deep sedation per PAD protocol, goal RASS -4, currently fentanyl, midazolam, and ketamine. Plan to try to wean the ketamine to off on 5/18 Enteral clonazepam, oxycodone as ordered  Severe sepsis, fever.  Suspect due to COVID-19 pneumonia.  Consider superimposed opportunistic bacterial infection.. Suspect that one of two GPC blood culture is coag negative staph, skin contaminant. Line was changed on 5/17, femoral line removed Follow blood cultures and respiratory culture from 5/17 Continue empiric cefepime and linezolid for now, tailor to culture results  AKI, slight improvement 5/17 Tolerated diuresis on 5/17 with stable (but elevated) serum creatinine. Attempt to repeat Lasix dosing on 5/18 and follow BMP closely Ensure adequate renal perfusion Avoid nephrotoxins  Hypernatremia Increase free water again on 5/18 Follow BMP  Diabetes mellitus Sign scale insulin per protocol    Best practice:  Diet: resume TF Pain/Anxiety/Delirium protocol (if indicated): 5/11 > fentanyl midazolam ketamine VAP protocol (if indicated): 5/11 DVT prophylaxis: enoxaparin  GI prophylaxis: pepcid Glucose control: SSI protocol Mobility: BR Code Status: Full Family Communication: Updated pt's mother by phone 5/17. Updated pt's wife on 5/18, including  fact that he is not a good candidate for ECMO Disposition: ICU   The patient is critically ill with multiple organ systems failure and requires high complexity decision making for assessment and support, frequent evaluation and titration of therapies, application of advanced monitoring technologies and extensive interpretation of multiple databases. Critical Care Time devoted to patient care services described in this note independent of APP/resident  time is 33 minutes.    Baltazar Apo, MD, PhD 12/10/2019, 5:32 AM [facility] [phone] or if no answer [phone]

## 2019-12-10 NOTE — Progress Notes (Signed)
Peep increased to 22, per verbal order.

## 2019-12-10 NOTE — Progress Notes (Signed)
Maps 55-67, DBP 30's-40's. Call to [facility], made aware, ok to restart Levophed DTT.

## 2019-12-10 NOTE — Progress Notes (Signed)
Went to bedside to reposition head.  When head was turned, this Rt was unable to pass the suction catheter, and the ventilator was alarming low minute ventilation.  Patient had audible cuff leak.  ETT was in the same position as prior to prone.  RN attempted to give bolus, as patient was biting ETT.  Central line was reading occluded.  RT and RN decided to place patient in the supine position due to ventilator and sedation issues.  Patient was placed in supine position with no complications.  ETT resecured at 26 at the lip.  No audible cuff leak at this time.  Patient tolerated well.

## 2019-12-11 ENCOUNTER — Inpatient Hospital Stay (HOSPITAL_COMMUNITY): Payer: No Typology Code available for payment source

## 2019-12-11 LAB — CULTURE, BLOOD (ROUTINE X 2)
Special Requests: ADEQUATE
Special Requests: ADEQUATE

## 2019-12-11 LAB — COMPREHENSIVE METABOLIC PANEL
ALT: 32 U/L (ref 0–44)
AST: 57 U/L — ABNORMAL HIGH (ref 15–41)
Albumin: 2.7 g/dL — ABNORMAL LOW (ref 3.5–5.0)
Alkaline Phosphatase: 27 U/L — ABNORMAL LOW (ref 38–126)
Anion gap: 6 (ref 5–15)
BUN: 80 mg/dL — ABNORMAL HIGH (ref 6–20)
CO2: 33 mmol/L — ABNORMAL HIGH (ref 22–32)
Calcium: 8.7 mg/dL — ABNORMAL LOW (ref 8.9–10.3)
Chloride: 117 mmol/L — ABNORMAL HIGH (ref 98–111)
Creatinine, Ser: 2.53 mg/dL — ABNORMAL HIGH (ref 0.61–1.24)
GFR calc Af Amer: 36 mL/min — ABNORMAL LOW (ref >60)
GFR calc non Af Amer: 31 mL/min — ABNORMAL LOW (ref >60)
Glucose, Bld: 162 mg/dL — ABNORMAL HIGH (ref 70–99)
Potassium: 5 mmol/L (ref 3.5–5.1)
Sodium: 156 mmol/L — ABNORMAL HIGH (ref 135–145)
Total Bilirubin: 1.1 mg/dL (ref 0.3–1.2)
Total Protein: 5.9 g/dL — ABNORMAL LOW (ref 6.5–8.1)

## 2019-12-11 LAB — CBC
HCT: 39.7 % (ref 39.0–52.0)
Hemoglobin: 10.3 g/dL — ABNORMAL LOW (ref 13.0–17.0)
MCH: 30.1 pg (ref 26.0–34.0)
MCHC: 25.9 g/dL — ABNORMAL LOW (ref 30.0–36.0)
MCV: 116.1 fL — ABNORMAL HIGH (ref 80.0–100.0)
Platelets: 101 10*3/uL — ABNORMAL LOW (ref 150–400)
RBC: 3.42 MIL/uL — ABNORMAL LOW (ref 4.22–5.81)
RDW: 13.2 % (ref 11.5–15.5)
WBC: 10.2 10*3/uL (ref 4.0–10.5)
nRBC: 0.2 % (ref 0.0–0.2)

## 2019-12-11 LAB — T3 UPTAKE: T3 Uptake Ratio: 33 % (ref 24–39)

## 2019-12-11 LAB — BASIC METABOLIC PANEL
Anion gap: 10 (ref 5–15)
BUN: 87 mg/dL — ABNORMAL HIGH (ref 6–20)
CO2: 32 mmol/L (ref 22–32)
Calcium: 8.7 mg/dL — ABNORMAL LOW (ref 8.9–10.3)
Chloride: 115 mmol/L — ABNORMAL HIGH (ref 98–111)
Creatinine, Ser: 2.77 mg/dL — ABNORMAL HIGH (ref 0.61–1.24)
GFR calc Af Amer: 33 mL/min — ABNORMAL LOW (ref >60)
GFR calc non Af Amer: 28 mL/min — ABNORMAL LOW (ref >60)
Glucose, Bld: 183 mg/dL — ABNORMAL HIGH (ref 70–99)
Potassium: 5.5 mmol/L — ABNORMAL HIGH (ref 3.5–5.1)
Sodium: 157 mmol/L — ABNORMAL HIGH (ref 135–145)

## 2019-12-11 LAB — POCT I-STAT 7, (LYTES, BLD GAS, ICA,H+H)
Acid-Base Excess: 9 mmol/L — ABNORMAL HIGH (ref 0.0–2.0)
Bicarbonate: 38.8 mmol/L — ABNORMAL HIGH (ref 20.0–28.0)
Calcium, Ion: 1.31 mmol/L (ref 1.15–1.40)
HCT: 34 % — ABNORMAL LOW (ref 39.0–52.0)
Hemoglobin: 11.6 g/dL — ABNORMAL LOW (ref 13.0–17.0)
O2 Saturation: 87 %
Patient temperature: 102.1
Potassium: 5.2 mmol/L — ABNORMAL HIGH (ref 3.5–5.1)
Sodium: 156 mmol/L — ABNORMAL HIGH (ref 135–145)
TCO2: 41 mmol/L — ABNORMAL HIGH (ref 22–32)
pCO2 arterial: 94.5 mmHg (ref 32.0–48.0)
pH, Arterial: 7.231 — ABNORMAL LOW (ref 7.350–7.450)
pO2, Arterial: 74 mmHg — ABNORMAL LOW (ref 83.0–108.0)

## 2019-12-11 LAB — GLUCOSE, CAPILLARY
Glucose-Capillary: 142 mg/dL — ABNORMAL HIGH (ref 70–99)
Glucose-Capillary: 116 mg/dL — ABNORMAL HIGH (ref 70–99)
Glucose-Capillary: 204 mg/dL — ABNORMAL HIGH (ref 70–99)
Glucose-Capillary: 170 mg/dL — ABNORMAL HIGH (ref 70–99)
Glucose-Capillary: 146 mg/dL — ABNORMAL HIGH (ref 70–99)
Glucose-Capillary: 150 mg/dL — ABNORMAL HIGH (ref 70–99)

## 2019-12-11 LAB — INTERLEUKIN-6, SERUM  IL6SL: Interleukin-6, Serum: 1959 pg/mL — ABNORMAL HIGH (ref 0.0–13.0)

## 2019-12-11 LAB — MAGNESIUM: Magnesium: 2.4 mg/dL (ref 1.7–2.4)

## 2019-12-11 LAB — PHOSPHORUS: Phosphorus: 3.5 mg/dL (ref 2.5–4.6)

## 2019-12-11 LAB — T4: T4, Total: 4.5 ug/dL (ref 4.5–12.0)

## 2019-12-11 MED ORDER — FREE WATER
400.0000 mL | Status: DC
  Administered 2019-12-11 – 2019-12-17 (×24): 400 mL

## 2019-12-11 NOTE — Progress Notes (Signed)
Pt placed into the prone position with RT x1 and RN x4. Pt ETT remains secure at 28cm at the lip. Pt tolerated well with good return volumes and no leak heard. RT will continue to monitor.

## 2019-12-11 NOTE — Progress Notes (Signed)
Wasted 90 ml Ketimine with Ander Purpura RN, wasted in sink.

## 2019-12-11 NOTE — Progress Notes (Signed)
RT NOTE:  Pt in prone position. RT assisted RNx2 with turning patients head to the left. Arms rotated. ETT secured throughout. RT will monitor.

## 2019-12-11 NOTE — Progress Notes (Signed)
NAME:  Clifford Nichols, MRN:  [id number], DOB:  Jun 04, 1981, LOS: 7 [facility] ADMISSION DATE:  12/04/2019, [facility] Admission DATE:  12/04/19 REFERRING MD:  Dr Mortimer Fries, CCM, CHIEF COMPLAINT:  Acute respiratory failure   Brief History   39 year old obese man with hypertension, diabetes admitted with acute respiratory failure, ARDS from COVID-19 pneumonia, initial positive test 5/10   Past Medical History   Past Medical History:  Diagnosis Date  . Abscess of upper back excluding scapular region   . Diabetes mellitus without complication ([place])   . GERD (gastroesophageal reflux disease)    OCC-NO MEDS  . Hypertension   . Sebaceous cyst     Significant Hospital Events   5/10 admitted [facility]  5/11 failed HHFNC, BiPAP.  Required intubation 5/12 transferred to [facility] 5/13 High PEEP and FiO2 requirement through the night Had continued issues with left proptosis, prone position deferred due to this  Consults:  Ophthalmology for proptosis 5/12  Procedures:  ETT 5/11 >>  L femoral CVC 5/11 >> 5/17 L IJ CVC 5/17 >>  R radial art line 5/17 >>    Significant Diagnostic Tests:  Head CT 5/11 >> no significant intracranial abnormality.  Scattered opacification and air-fluid levels in the frontal, ethmoid, sphenoid and maxillary sinuses of unclear significance. Head CT 5/12 >> no significant intracranial abnormality Lower extremity Doppler 5/14 >> negative for DVT  Micro Data:  SARS CoV2 5/10 >> positive MRSA screen 5/12 >> negative Respiratory 5/12 >> negative Blood 5/17 >> 2 of 2 GPC >> 1 speciated as coag negative staph, the other pending Respiratory 5/17 >> GPC clusters >>   Antimicrobials:  Remdesivir 5/11 >> 5/15 Dexamethasone 5/11 >> Tocilizumab 5/12 Cefepime 5/17 >> Linezolid 5/17 >>   Interim history/subjective:   Fever overnight, 102.1 Patient had trouble in the prone position yesterday, had apparent cuff leak with loss of volumes and PEEP.  Also poor flow from his central line and  unclear whether he was getting adequate administration of his continuous medications, sedation. Had been constipated, bowel movement achieved on 5/18.  Flexi-Seal in place Now with 2 out of 2 blood cultures from 5/17 with GPC, 1 shows coag negative staph Ketamine weaned to off Low-dose norepinephrine started overnight Remains paralyzed I/O +3.5 L total Sodium elevated 156, serum creatinine stable 2.53, potassium 5.0 Vt liberalized this morning to 460 cc based on significant hypercapnia ABG 1.00, PEEP 22   Objective   Blood pressure (!) 116/49, pulse (!) 104, temperature (!) 102 F (38.9 C), temperature source Oral, resp. rate (!) 35, height 5\' 7"  (1.702 m), weight (!) 179.6 kg, SpO2 95 %.    Vent Mode: PRVC FiO2 (%):  [100 %] 100 % Set Rate:  [35 bmp] 35 bmp Vt Set:  [380 mL-460 mL] 460 mL PEEP:  [20 cmH20-22 cmH20] 22 cmH20 Plateau Pressure:  [35 cmH20-38 cmH20] 36 cmH20   Intake/Output Summary (Last 24 hours) at 12/11/2019 0753 Last data filed at 12/11/2019 0739 Gross per 24 hour  Intake 5515.26 ml  Output 6125 ml  Net -609.74 ml   Filed Weights   12/09/19 0500 12/10/19 0431 12/11/19 0325  Weight: (!) 191.9 kg (!) 184.6 kg (!) 179.6 kg    Examination: General: Morbidly obese man, sedated, ventilated HENT: Oral ETT, no pallor or icterus mild proptosis noted Lungs: Very distant, coarse, no wheeze Cardiovascular: Regular, distant, no murmur Abdomen: Obese, nondistended, positive bowel sounds Extremities: trace edema in lower extrem. 2+ pulses Neuro: Sedated. PERRL, no dolls eyes (proptosis though), +  gag reflex RASS -5 GU: Foley catheter in place, urine clear   Resolved Hospital Problem list    Left proptosis, noted with coughing early 5/12, resolved.  Apparently per wife he has episodic intermittent proptosis at home as well. Head CT 5/12 reassuring Ophthalmology did evaluate, recommended that he will need outpatient evaluation but no intervention at this time beyond  keeping the eyes moist.  Assessment & Plan:   ARDS due to COVID-19 pneumonia  Morbid obesity, possible OSA.  Will certainly impact respiratory mechanics Mechanical ventilation via ARDS protocol, target PRVC 6 cc/kg Wean PEEP and FiO2 as able Goal plateau pressure less than 30, driving pressure less than 15 Paralytics ordered 5/17 for vent synchrony, gas exchange Cycle prone positioning if necessary for oxygenation.  Deferred initially given his proptosis. We will try to initiate on 5/18 Deep sedation per PAD protocol, goal RASS -4, currently fentanyl, Versed, ketamine infusions. May be able to discontinue the ketamine on 5/18 Diuresis as blood pressure and renal function can tolerate, goal CVP 5-8 Discussed possible ECMO with multidisciplinary team. Felt to be a poor candidate at this time 5/17 VAP prevention order set Remdesivir, completed 5 days Dexamethasone plan for 10 days Tocilizumab given on 5/12 Vitamin C, zinc as ordered   Acute toxic metabolic encephalopathy, need for sedation Continue deep sedation per PAD protocol, goal RASS -4, currently fentanyl, midazolam, and ketamine. Plan to try to wean the ketamine to off on 5/18 Enteral clonazepam, oxycodone as ordered  Septic shock not present on admission, fever.  Due to COVID-19 pneumonia.  Now with evidence for superimposed bacterial infection.  Initially felt that coag negative staph on blood culture was likely skin contaminant but now 2 of 2 with GPC. Line was changed on 5/17 Continue linezolid, cefepime at this time, tailor to culture results when available No evidence for endocarditis on echocardiogram done 5/17.  May ultimately need TEE depending on the course, culture results. Wean norepinephrine Will likely need to defer diuresis due to hemodynamic instability 5/19  AKI, stable 5/19 Dosing diuretics daily depending on renal function, hemodynamics.  Hold on 5/19 given pressor need, evolving sepsis Ensure adequate renal  perfusion Avoid nephrotoxins  Hypernatremia Increase free water again on 5/19 Follow BMP  Diabetes mellitus Continue sliding-scale insulin as ordered    Best practice:  Diet: resume TF Pain/Anxiety/Delirium protocol (if indicated): 5/11 > fentanyl midazolam VAP protocol (if indicated): 5/11 DVT prophylaxis: enoxaparin  GI prophylaxis: pepcid Glucose control: SSI protocol Mobility: BR Code Status: Full Family Communication: Updated pt's mother by phone on 5/19. Updated pt's wife on 5/18, including fact that he is not a good candidate for ECMO Disposition: ICU   The patient is critically ill with multiple organ systems failure and requires high complexity decision making for assessment and support, frequent evaluation and titration of therapies, application of advanced monitoring technologies and extensive interpretation of multiple databases. Critical Care Time devoted to patient care services described in this note independent of APP/resident  time is 34 minutes.    Levy Pupa, MD, PhD 12/11/2019, 7:53 AM [facility] [phone] or if no answer [phone]

## 2019-12-11 NOTE — Progress Notes (Signed)
Assisted tele visit to patient with wife, Jaci Carrel, x 2 today.  Vena Austria, RN

## 2019-12-11 NOTE — Progress Notes (Signed)
RT NOTE:  Pt in prone position. RT assisted RNx2 with turning patients head to the right. Arms rotated. ETT secured throughout. RT will monitor.

## 2019-12-11 NOTE — Progress Notes (Signed)
Dr.Ogan aware of 2300 BMP.

## 2019-12-11 NOTE — Progress Notes (Signed)
Pt moved from bariatric bed to a regular bed with RT x1, NT x1 and RN x4. Pt tolerated well, will allow pt time to recover before placing into the prone position.

## 2019-12-11 NOTE — Progress Notes (Signed)
Pt Clifford Nichols ETT holder removed. Protective barriers placed to pts cheeks and upper lip. No skin breakdown noted at this time. ETT secured in place with cloth tape at 28cm at the center lip. Pt to be proned with staff available. RT will continue to monitor.

## 2019-12-11 NOTE — Progress Notes (Signed)
Assisted tele visit to patient with wife.  [place], Maria Samson, RN  

## 2019-12-11 NOTE — Progress Notes (Signed)
Assisted tele visit to patient with wife.  Horris Speros Samson, RN  

## 2019-12-12 ENCOUNTER — Encounter: Payer: Self-pay | Admitting: Internal Medicine

## 2019-12-12 ENCOUNTER — Inpatient Hospital Stay (HOSPITAL_COMMUNITY): Payer: No Typology Code available for payment source

## 2019-12-12 LAB — POCT I-STAT 7, (LYTES, BLD GAS, ICA,H+H)
Acid-Base Excess: 10 mmol/L — ABNORMAL HIGH (ref 0.0–2.0)
Acid-Base Excess: 10 mmol/L — ABNORMAL HIGH (ref 0.0–2.0)
Bicarbonate: 36.6 mmol/L — ABNORMAL HIGH (ref 20.0–28.0)
Bicarbonate: 35.8 mmol/L — ABNORMAL HIGH (ref 20.0–28.0)
Calcium, Ion: 1.23 mmol/L (ref 1.15–1.40)
Calcium, Ion: 1.28 mmol/L (ref 1.15–1.40)
HCT: 32 % — ABNORMAL LOW (ref 39.0–52.0)
HCT: 34 % — ABNORMAL LOW (ref 39.0–52.0)
Hemoglobin: 10.9 g/dL — ABNORMAL LOW (ref 13.0–17.0)
Hemoglobin: 11.6 g/dL — ABNORMAL LOW (ref 13.0–17.0)
O2 Saturation: 100 %
O2 Saturation: 92 %
Patient temperature: 99.1
Potassium: 3.9 mmol/L (ref 3.5–5.1)
Potassium: 3.6 mmol/L (ref 3.5–5.1)
Sodium: 159 mmol/L — ABNORMAL HIGH (ref 135–145)
Sodium: 156 mmol/L — ABNORMAL HIGH (ref 135–145)
TCO2: 38 mmol/L — ABNORMAL HIGH (ref 22–32)
TCO2: 37 mmol/L — ABNORMAL HIGH (ref 22–32)
pCO2 arterial: 58.8 mmHg — ABNORMAL HIGH (ref 32.0–48.0)
pCO2 arterial: 53.1 mmHg — ABNORMAL HIGH (ref 32.0–48.0)
pH, Arterial: 7.402 (ref 7.350–7.450)
pH, Arterial: 7.437 (ref 7.350–7.450)
pO2, Arterial: 235 mmHg — ABNORMAL HIGH (ref 83.0–108.0)
pO2, Arterial: 63 mmHg — ABNORMAL LOW (ref 83.0–108.0)

## 2019-12-12 LAB — COMPREHENSIVE METABOLIC PANEL
ALT: 33 U/L (ref 0–44)
AST: 60 U/L — ABNORMAL HIGH (ref 15–41)
Albumin: 2.9 g/dL — ABNORMAL LOW (ref 3.5–5.0)
Alkaline Phosphatase: 34 U/L — ABNORMAL LOW (ref 38–126)
Anion gap: 10 (ref 5–15)
BUN: 71 mg/dL — ABNORMAL HIGH (ref 6–20)
CO2: 34 mmol/L — ABNORMAL HIGH (ref 22–32)
Calcium: 8.8 mg/dL — ABNORMAL LOW (ref 8.9–10.3)
Chloride: 115 mmol/L — ABNORMAL HIGH (ref 98–111)
Creatinine, Ser: 2.09 mg/dL — ABNORMAL HIGH (ref 0.61–1.24)
GFR calc Af Amer: 46 mL/min — ABNORMAL LOW (ref >60)
GFR calc non Af Amer: 40 mL/min — ABNORMAL LOW (ref >60)
Glucose, Bld: 151 mg/dL — ABNORMAL HIGH (ref 70–99)
Potassium: 4.4 mmol/L (ref 3.5–5.1)
Sodium: 159 mmol/L — ABNORMAL HIGH (ref 135–145)
Total Bilirubin: 1.5 mg/dL — ABNORMAL HIGH (ref 0.3–1.2)
Total Protein: 6.2 g/dL — ABNORMAL LOW (ref 6.5–8.1)

## 2019-12-12 LAB — CULTURE, RESPIRATORY W GRAM STAIN

## 2019-12-12 LAB — PHOSPHORUS: Phosphorus: 3 mg/dL (ref 2.5–4.6)

## 2019-12-12 LAB — GLUCOSE, CAPILLARY
Glucose-Capillary: 123 mg/dL — ABNORMAL HIGH (ref 70–99)
Glucose-Capillary: 126 mg/dL — ABNORMAL HIGH (ref 70–99)
Glucose-Capillary: 146 mg/dL — ABNORMAL HIGH (ref 70–99)
Glucose-Capillary: 159 mg/dL — ABNORMAL HIGH (ref 70–99)
Glucose-Capillary: 163 mg/dL — ABNORMAL HIGH (ref 70–99)
Glucose-Capillary: 182 mg/dL — ABNORMAL HIGH (ref 70–99)

## 2019-12-12 LAB — CBC
HCT: 39.5 % (ref 39.0–52.0)
Hemoglobin: 10.5 g/dL — ABNORMAL LOW (ref 13.0–17.0)
MCH: 30.3 pg (ref 26.0–34.0)
MCHC: 26.6 g/dL — ABNORMAL LOW (ref 30.0–36.0)
MCV: 114.2 fL — ABNORMAL HIGH (ref 80.0–100.0)
Platelets: 90 10*3/uL — ABNORMAL LOW (ref 150–400)
RBC: 3.46 MIL/uL — ABNORMAL LOW (ref 4.22–5.81)
RDW: 13.5 % (ref 11.5–15.5)
WBC: 12.6 10*3/uL — ABNORMAL HIGH (ref 4.0–10.5)
nRBC: 0 % (ref 0.0–0.2)

## 2019-12-12 LAB — MAGNESIUM: Magnesium: 2.3 mg/dL (ref 1.7–2.4)

## 2019-12-12 MED ORDER — SODIUM CHLORIDE 0.9 % IV SOLN: INTRAVENOUS | Status: DC | PRN

## 2019-12-12 MED ORDER — DEXTROSE 5 % IV SOLN: INTRAVENOUS | Status: DC

## 2019-12-12 MED ORDER — VITAL 1.5 CAL PO LIQD
1000.0000 mL | ORAL | Status: DC
  Administered 2019-12-16: 1000 mL
  Filled 2019-12-12 (×6): qty 1000

## 2019-12-12 MED ORDER — DEXTROSE 5 % IV SOLN
500.0000 mg | Freq: Two times a day (BID) | INTRAVENOUS | Status: AC
  Administered 2019-12-12 (×2): 500 mg via INTRAVENOUS
  Filled 2019-12-12 (×2): qty 500

## 2019-12-12 MED ORDER — METOCLOPRAMIDE HCL 5 MG/ML IJ SOLN
10.0000 mg | Freq: Four times a day (QID) | INTRAMUSCULAR | Status: AC
  Administered 2019-12-12 – 2019-12-14 (×8): 10 mg via INTRAVENOUS
  Filled 2019-12-12 (×9): qty 2

## 2019-12-12 NOTE — Progress Notes (Signed)
  12/12/2019   Clifford Nichols [address] [place] [address]  To whom it may concern:      [name] is critically ill in the hospital with an expected stay of at least 3 weeks.  Please excuse him from work during this time period.  Sincerely,     Levon Hedger MD  Pulmonary Critical Care

## 2019-12-12 NOTE — Progress Notes (Signed)
RT NOTE:  Pt in prone position. RT assisted RNx2 with turning patients head to the left. Arms rotated. ETT secured throughout. RT will monitor.   

## 2019-12-12 NOTE — Progress Notes (Signed)
Pt. Found on these settings.

## 2019-12-12 NOTE — Progress Notes (Signed)
Nutrition Follow-up  DOCUMENTATION CODES:   Morbid obesity  INTERVENTION:   Plan for Post-Pyloric Cortrak tube placement tomorrow  Tube Feeding via OG tube:  Recommend resuming TF at rate of 20 ml/hr once able Goal rate: Change to Vital 1.5 @ 50 ml/hr (1200 ml/day)  60 ml Prostat TID Provides: 2400 kcal, 171 grams protein, and 912 ml free water  Total free water with TF plus water flush of 400 mL q 3 hours: 4112 mL   NUTRITION DIAGNOSIS:   Increased nutrient needs related to acute illness(COVID-19 PNA) as evidenced by estimated needs.  Being addressed via TF   GOAL:   Provide needs based on ASPEN/SCCM guidelines  Met  MONITOR:   Vent status, TF tolerance  REASON FOR ASSESSMENT:   Consult, Ventilator Enteral/tube feeding initiation and management  ASSESSMENT:   Pt with PMH of HTN, DM, and morbid obesity admitted with acute respiratory failure/ARDS from COVID-19 PNA.  5/10 admit to [facility] 5/11 required urgent intubation 5/12 tx to [facility] 5/12; ophthalmology for proptosis. Per notes RN having to push eye back into orbit therefore unable to prone pt.   Continues with prone positioning: prone/supine 16:8 Pt remains on vent support, paralyzed on nimbex gtt, sedated with fentanyl and versed gtt, off levophed  Hypernatremic, noted free water flushes increased to 400 mL q 3 hours.   TF on hold due to vomiting. Per RN, stomach contents pouring from mouth.  Previously tolerating Vital 1.2 AF at 45 ml/hr  No pressure injuries noted  Labs: sodium 159 (H), Creatinine 2.09, BUN 71, potassium wdl, phosphorus wdl, CBGs 123-204 Meds: ss novolog, vit c, zinc sulfate   Diet Order:   Diet Order            Diet NPO time specified  Diet effective now              EDUCATION NEEDS:   No education needs have been identified at this time  Skin:  Skin Assessment: Reviewed RN Assessment  Last BM:  5/20 rectal tube  Height:   Ht Readings from Last 1 Encounters:   12/04/19 5' 7" (1.702 m)    Weight:   Wt Readings from Last 1 Encounters:  12/12/19 (!) 168.8 kg    Ideal Body Weight:  67.2 kg  BMI:  Body mass index is 58.28 kg/m.  Estimated Nutritional Needs:   Kcal:  0174-9449  Protein:  140-175 grams  Fluid:  2 L/day   Kerman Passey MS, RDN, LDN, CNSC RD Pager Number and RD On-Call Pager Number Located in [place]

## 2019-12-12 NOTE — Progress Notes (Signed)
Assisted tele visit to patient with wife, Jaci Carrel.  Vena Austria, RN

## 2019-12-12 NOTE — Progress Notes (Signed)
Pt head turned to right. Arms re-adjusted. No complications.

## 2019-12-12 NOTE — Progress Notes (Signed)
NAME:  Clifford Nichols, MRN:  [id number], DOB:  03-25-1981, LOS: 8 [facility] ADMISSION DATE:  12/04/2019, [facility] Admission DATE:  12/04/19 REFERRING MD:  Dr Belia Heman, CCM, CHIEF COMPLAINT:  Acute respiratory failure   Brief History   39 year old obese man with hypertension, diabetes admitted with acute respiratory failure, ARDS from COVID-19 pneumonia, initial positive test 5/10   Past Medical History   Past Medical History:  Diagnosis Date  . Abscess of upper back excluding scapular region   . Diabetes mellitus without complication (HCC)   . GERD (gastroesophageal reflux disease)    OCC-NO MEDS  . Hypertension   . Sebaceous cyst     Significant Hospital Events   5/10 admitted [facility]  5/11 failed HHFNC, BiPAP.  Required intubation 5/12 transferred to [facility] 5/13 High PEEP and FiO2 requirement through the night Had continued issues with left proptosis, prone position deferred due to this  Consults:  Ophthalmology for proptosis 5/12  Procedures:  ETT 5/11 >>  L femoral CVC 5/11 >> 5/17 L IJ CVC 5/17 >>  R radial art line 5/17 >>    Significant Diagnostic Tests:  Head CT 5/11 >> no significant intracranial abnormality.  Scattered opacification and air-fluid levels in the frontal, ethmoid, sphenoid and maxillary sinuses of unclear significance. Head CT 5/12 >> no significant intracranial abnormality Lower extremity Doppler 5/14 >> negative for DVT  Micro Data:  SARS CoV2 5/10 >> positive MRSA screen 5/12 >> negative Respiratory 5/12 >> negative Blood 5/17 >> 2 of 2 GPC >> 1 speciated as coag negative staph, the other pending Respiratory 5/17 >> GPC clusters >>   Antimicrobials:  Remdesivir 5/11 >> 5/15 Dexamethasone 5/11 >> Tocilizumab 5/12 Cefepime 5/17 >> Linezolid 5/17 >>   Interim history/subjective:  Proned this AM. Improved sats with this. No gas yet. Remains intubated, sedated, paralyzed. Issues with TF-like output from mouth when prone.  Objective   Blood pressure  133/79, pulse (!) 107, temperature 99.4 F (37.4 C), temperature source Axillary, resp. rate (!) 35, height 5\' 7"  (1.702 m), weight (!) 168.8 kg, SpO2 100 %.    Vent Mode: PRVC FiO2 (%):  [100 %] 100 % Set Rate:  [35 bmp] 35 bmp Vt Set:  [460 mL] 460 mL PEEP:  [22 cmH20] 22 cmH20 Plateau Pressure:  [36 cmH20-41 cmH20] 36 cmH20   Intake/Output Summary (Last 24 hours) at 12/12/2019 0931 Last data filed at 12/12/2019 0800 Gross per 24 hour  Intake 5967.88 ml  Output 3350 ml  Net 2617.88 ml   Filed Weights   12/10/19 0431 12/11/19 0325 12/12/19 0500  Weight: (!) 184.6 kg (!) 179.6 kg (!) 168.8 kg    Examination: GEN: proned obese man on vent HEENT: ETT in place, minimal secretions CV: Prone PULM: Diminished BL, passive on vent GI: Proned, rectal tube in place EXT: trace edema NEURO: paralyzed PSYCH: BIS 45 SKIN: No rashes  Sodium up to 159 Cr improved Stable H/H Plts slightly down WBC slightly up Staph epi in sputum and blood 1/4: on zyvox Staph hominus also in blood 1/4 Glucose okay  Resolved Hospital Problem list    Left proptosis, noted with coughing early 5/12, resolved.  Apparently per wife he has episodic intermittent proptosis at home as well. Head CT 5/12 reassuring Ophthalmology did evaluate, recommended that he will need outpatient evaluation but no intervention at this time beyond keeping the eyes moist.  Assessment & Plan:   ARDS due to COVID-19 pneumonia  Morbid obesity, possible OSA.  Will certainly  impact respiratory mechanics - Work on weaning driving pressure today - Continue prone/supine pattern 16:8 hours while P/F ratio <150 - VAP prevention bundle - s/p remdesivir, actemra - dexamethasone to end today  Acute toxic metabolic encephalopathy, need for sedation Continue deep sedation per PAD protocol, goal RASS -4, currently fentanyl, midazolam, and ketamine. Plan to try to wean the ketamine to off on 5/18 Enteral clonazepam, oxycodone as ordered   Septic shock not present on admission, fever.  Due to COVID-19 pneumonia.  Question Staph epi pneumonia.  Fever curve improved. - Zyvox x 7 days  AKI, improved, hypernatremia - Diuril, free water, trend  Best practice:  Diet: resume TF Pain/Anxiety/Delirium protocol (if indicated): 5/11 > fentanyl midazolam VAP protocol (if indicated): 5/11 DVT prophylaxis: enoxaparin 0.4 mg/kg qday GI prophylaxis: pepcid Glucose control: SSI protocol Mobility: BR Code Status: Full Family Communication: called and updated wife 5/20 Disposition: ICU  The patient is critically ill with multiple organ systems failure and requires high complexity decision making for assessment and support, frequent evaluation and titration of therapies, application of advanced monitoring technologies and extensive interpretation of multiple databases. Critical Care Time devoted to patient care services described in this note independent of APP/resident time (if applicable)  is 35 minutes.   [name] MD [place] [facility] 12/12/2019 10:03 AM Personal pager: [phone] If unanswered, please page CCM On-call: [phone]

## 2019-12-12 NOTE — Progress Notes (Signed)
Pt supined without any complications.  Commercial ETT holder applied.

## 2019-12-13 ENCOUNTER — Inpatient Hospital Stay (HOSPITAL_COMMUNITY): Payer: No Typology Code available for payment source

## 2019-12-13 LAB — POCT I-STAT 7, (LYTES, BLD GAS, ICA,H+H)
Acid-Base Excess: 7 mmol/L — ABNORMAL HIGH (ref 0.0–2.0)
Acid-Base Excess: 4 mmol/L — ABNORMAL HIGH (ref 0.0–2.0)
Acid-Base Excess: 6 mmol/L — ABNORMAL HIGH (ref 0.0–2.0)
Bicarbonate: 37 mmol/L — ABNORMAL HIGH (ref 20.0–28.0)
Bicarbonate: 34.2 mmol/L — ABNORMAL HIGH (ref 20.0–28.0)
Bicarbonate: 35.8 mmol/L — ABNORMAL HIGH (ref 20.0–28.0)
Calcium, Ion: 1.28 mmol/L (ref 1.15–1.40)
Calcium, Ion: 1.17 mmol/L (ref 1.15–1.40)
Calcium, Ion: 1.25 mmol/L (ref 1.15–1.40)
HCT: 33 % — ABNORMAL LOW (ref 39.0–52.0)
HCT: 29 % — ABNORMAL LOW (ref 39.0–52.0)
HCT: 32 % — ABNORMAL LOW (ref 39.0–52.0)
Hemoglobin: 11.2 g/dL — ABNORMAL LOW (ref 13.0–17.0)
Hemoglobin: 9.9 g/dL — ABNORMAL LOW (ref 13.0–17.0)
Hemoglobin: 10.9 g/dL — ABNORMAL LOW (ref 13.0–17.0)
O2 Saturation: 97 %
O2 Saturation: 99 %
O2 Saturation: 99 %
Patient temperature: 37.1
Patient temperature: 100.2
Patient temperature: 100.2
Potassium: 4.1 mmol/L (ref 3.5–5.1)
Potassium: 4.1 mmol/L (ref 3.5–5.1)
Potassium: 4.3 mmol/L (ref 3.5–5.1)
Sodium: 155 mmol/L — ABNORMAL HIGH (ref 135–145)
Sodium: 154 mmol/L — ABNORMAL HIGH (ref 135–145)
Sodium: 151 mmol/L — ABNORMAL HIGH (ref 135–145)
TCO2: 40 mmol/L — ABNORMAL HIGH (ref 22–32)
TCO2: 37 mmol/L — ABNORMAL HIGH (ref 22–32)
TCO2: 38 mmol/L — ABNORMAL HIGH (ref 22–32)
pCO2 arterial: 88.4 mmHg (ref 32.0–48.0)
pCO2 arterial: >97 mmHg (ref 32.0–48.0)
pCO2 arterial: 88 mmHg (ref 32.0–48.0)
pH, Arterial: 7.23 — ABNORMAL LOW (ref 7.350–7.450)
pH, Arterial: 7.159 — CL (ref 7.350–7.450)
pH, Arterial: 7.222 — ABNORMAL LOW (ref 7.350–7.450)
pO2, Arterial: 117 mmHg — ABNORMAL HIGH (ref 83.0–108.0)
pO2, Arterial: 160 mmHg — ABNORMAL HIGH (ref 83.0–108.0)
pO2, Arterial: 201 mmHg — ABNORMAL HIGH (ref 83.0–108.0)

## 2019-12-13 LAB — CBC
HCT: 37.3 % — ABNORMAL LOW (ref 39.0–52.0)
Hemoglobin: 9.9 g/dL — ABNORMAL LOW (ref 13.0–17.0)
MCH: 29.7 pg (ref 26.0–34.0)
MCHC: 26.5 g/dL — ABNORMAL LOW (ref 30.0–36.0)
MCV: 112 fL — ABNORMAL HIGH (ref 80.0–100.0)
Platelets: 77 10*3/uL — ABNORMAL LOW (ref 150–400)
RBC: 3.33 MIL/uL — ABNORMAL LOW (ref 4.22–5.81)
RDW: 13.4 % (ref 11.5–15.5)
WBC: 10.4 10*3/uL (ref 4.0–10.5)
nRBC: 0 % (ref 0.0–0.2)

## 2019-12-13 LAB — BASIC METABOLIC PANEL
Anion gap: 9 (ref 5–15)
BUN: 60 mg/dL — ABNORMAL HIGH (ref 6–20)
CO2: 33 mmol/L — ABNORMAL HIGH (ref 22–32)
Calcium: 9 mg/dL (ref 8.9–10.3)
Chloride: 113 mmol/L — ABNORMAL HIGH (ref 98–111)
Creatinine, Ser: 1.82 mg/dL — ABNORMAL HIGH (ref 0.61–1.24)
GFR calc Af Amer: 54 mL/min — ABNORMAL LOW (ref >60)
GFR calc non Af Amer: 47 mL/min — ABNORMAL LOW (ref >60)
Glucose, Bld: 151 mg/dL — ABNORMAL HIGH (ref 70–99)
Potassium: 3.5 mmol/L (ref 3.5–5.1)
Sodium: 155 mmol/L — ABNORMAL HIGH (ref 135–145)

## 2019-12-13 LAB — GLUCOSE, CAPILLARY
Glucose-Capillary: 146 mg/dL — ABNORMAL HIGH (ref 70–99)
Glucose-Capillary: 147 mg/dL — ABNORMAL HIGH (ref 70–99)
Glucose-Capillary: 197 mg/dL — ABNORMAL HIGH (ref 70–99)
Glucose-Capillary: 165 mg/dL — ABNORMAL HIGH (ref 70–99)
Glucose-Capillary: 165 mg/dL — ABNORMAL HIGH (ref 70–99)

## 2019-12-13 LAB — TRIGLYCERIDES: Triglycerides: 642 mg/dL — ABNORMAL HIGH (ref <150)

## 2019-12-13 MED ORDER — DEXTROSE 5 % IV SOLN: INTRAVENOUS | Status: AC

## 2019-12-13 MED ORDER — NOREPINEPHRINE 4 MG/250ML-% IV SOLN
0.0000 ug/min | INTRAVENOUS | Status: DC
  Administered 2019-12-13: 15 ug/min via INTRAVENOUS
  Administered 2019-12-13: 4 ug/min via INTRAVENOUS
  Filled 2019-12-13: qty 250

## 2019-12-13 MED ORDER — METOLAZONE 5 MG PO TABS
5.0000 mg | ORAL_TABLET | Freq: Once | ORAL | Status: AC
  Administered 2019-12-13: 5 mg via ORAL
  Filled 2019-12-13: qty 1

## 2019-12-13 MED ORDER — PIPERACILLIN-TAZOBACTAM 3.375 G IVPB 30 MIN
3.3750 g | Freq: Once | INTRAVENOUS | Status: AC
  Administered 2019-12-13: 3.375 g via INTRAVENOUS
  Filled 2019-12-13: qty 50

## 2019-12-13 MED ORDER — PIPERACILLIN-TAZOBACTAM 3.375 G IVPB
3.3750 g | Freq: Three times a day (TID) | INTRAVENOUS | Status: AC
  Administered 2019-12-14 – 2019-12-19 (×18): 3.375 g via INTRAVENOUS
  Filled 2019-12-13 (×18): qty 50

## 2019-12-13 MED ORDER — PROPOFOL 1000 MG/100ML IV EMUL
INTRAVENOUS | Status: AC
  Administered 2019-12-13: 5 ug/kg/min via INTRAVENOUS
  Filled 2019-12-13: qty 100

## 2019-12-13 MED ORDER — NOREPINEPHRINE 4 MG/250ML-% IV SOLN
INTRAVENOUS | Status: AC
  Filled 2019-12-13: qty 250

## 2019-12-13 MED ORDER — STERILE WATER FOR INJECTION IV SOLN
INTRAVENOUS | Status: DC
  Filled 2019-12-13 (×2): qty 850

## 2019-12-13 MED ORDER — NOREPINEPHRINE 16 MG/250ML-% IV SOLN
0.0000 ug/min | INTRAVENOUS | Status: DC
  Administered 2019-12-13: 15 ug/min via INTRAVENOUS
  Filled 2019-12-13: qty 250

## 2019-12-13 MED ORDER — PROPOFOL 1000 MG/100ML IV EMUL
5.0000 ug/kg/min | INTRAVENOUS | Status: DC
  Administered 2019-12-13 (×2): 70 ug/kg/min via INTRAVENOUS
  Administered 2019-12-13: 60 ug/kg/min via INTRAVENOUS
  Administered 2019-12-13 – 2019-12-14 (×6): 70 ug/kg/min via INTRAVENOUS
  Administered 2019-12-14: 75 ug/kg/min via INTRAVENOUS
  Administered 2019-12-14 (×3): 70 ug/kg/min via INTRAVENOUS
  Administered 2019-12-14: 65 ug/kg/min via INTRAVENOUS
  Filled 2019-12-13 (×4): qty 100
  Filled 2019-12-13: qty 200
  Filled 2019-12-13 (×9): qty 100

## 2019-12-13 MED ORDER — POTASSIUM CHLORIDE 20 MEQ/15ML (10%) PO SOLN
40.0000 meq | ORAL | Status: AC
  Administered 2019-12-13 (×2): 40 meq via ORAL
  Filled 2019-12-13 (×2): qty 30

## 2019-12-13 NOTE — Progress Notes (Signed)
ABG results called to box and given to [place]. Results to be relayed to MD.

## 2019-12-13 NOTE — Procedures (Signed)
Cortrak  Person Inserting Tube:  Osa Craver, RD Tube Type:  Cortrak - 43 inches Tube Location:  Left nare Initial Placement:  Postpyloric Secured by: Bridle Technique Used to Measure Tube Placement:  Documented cm marking at nare/ corner of mouth Cortrak Secured At:  91 cm Procedure Comments:  Cortrak Tube Team Note:  Consult received to place a Cortrak feeding tube.   X-ray is required, abdominal x-ray has been ordered by the Cortrak team. Please confirm tube placement before using the Cortrak tube.   If the tube becomes dislodged please keep the tube and contact the Cortrak team at [email or web address] ([email or web address]) for replacement.  If after hours and replacement cannot be delayed, place a NG tube and confirm placement with an abdominal x-ray.   Romelle Starcher MS, RDN, LDN, CNSC RD Pager Number and RD On-Call Pager Number Located in [place]

## 2019-12-13 NOTE — Progress Notes (Signed)
Pt proned without any complications.  ETT secured with cloth tape and mepilex applied to protect skin.

## 2019-12-13 NOTE — Progress Notes (Signed)
Pharmacy Antibiotic Note  Clifford Nichols is a 39 y.o. male admitted on 12/04/2019 with aspiration PNA.  Pharmacy has been consulted for Zosyn dosing. SCr down to 1.82.  Plan: Zosyn 3.375g IV ( infusion) x1; then 3.375g IV q8h (4h infusion) Monitor clinical progress, c/s, renal function F/u de-escalation plan/LOT   Height: 5\' 7"  (170.2 cm) Weight: (!) 172.6 kg (380 lb 8.2 oz) IBW/kg (Calculated) : 66.1  Temp (24hrs), Avg:99.2 F (37.3 C), Min:98.7 F (37.1 C), Max:100.3 F (37.9 C)  Recent Labs  Lab 12/09/19 0352 12/09/19 0352 12/09/19 1715 12/09/19 1715 12/10/19 0451 12/10/19 0451 12/10/19 1559 12/10/19 2252 12/11/19 0443 12/12/19 0400 12/13/19 0825  WBC 9.1  --   --   --  10.1  --   --   --  10.2 12.6* 10.4  CREATININE 2.71*   < > 2.70*   < > 3.08*   < > 3.05* 2.77* 2.53* 2.09* 1.82*  LATICACIDVEN  --   --  1.6  --   --   --   --   --   --   --   --    < > = values in this interval not displayed.    Estimated Creatinine Clearance: 86.3 mL/min (A) (by C-G formula based on SCr of 1.82 mg/dL (H)).    No Known Allergies  12/15/19, PharmD, BCPS Please check AMION for all [facility] Pharmacy contact numbers Clinical Pharmacist 12/13/2019 4:53 PM

## 2019-12-13 NOTE — Progress Notes (Signed)
NAME:  Clifford Nichols, MRN:  [id number], DOB:  1980/10/31, LOS: 27 [facility] ADMISSION DATE:  12/04/2019, [facility] Admission DATE:  12/04/19 REFERRING MD:  Dr Mortimer Fries, CCM, CHIEF COMPLAINT:  Acute respiratory failure   Brief History   39 year old obese man with hypertension, diabetes admitted with acute respiratory failure, ARDS from COVID-19 pneumonia, initial positive test 5/10   Past Medical History   Past Medical History:  Diagnosis Date  . Abscess of upper back excluding scapular region   . Diabetes mellitus without complication ([place])   . GERD (gastroesophageal reflux disease)    OCC-NO MEDS  . Hypertension   . Sebaceous cyst     Significant Hospital Events   5/10 admitted [facility]  5/11 failed HHFNC, BiPAP.  Required intubation 5/12 transferred to [facility] 5/13 High PEEP and FiO2 requirement through the night Had continued issues with left proptosis, prone position deferred due to this  Consults:  Ophthalmology for proptosis 5/12  Procedures:  ETT 5/11 >>  L femoral CVC 5/11 >> 5/17 L IJ CVC 5/17 >>  R radial art line 5/17 >>    Significant Diagnostic Tests:  Head CT 5/11 >> no significant intracranial abnormality.  Scattered opacification and air-fluid levels in the frontal, ethmoid, sphenoid and maxillary sinuses of unclear significance. Head CT 5/12 >> no significant intracranial abnormality Lower extremity Doppler 5/14 >> negative for DVT  Micro Data:  SARS CoV2 5/10 >> positive MRSA screen 5/12 >> negative Respiratory 5/12 >> negative Blood 5/17 >> 2 of 2 GPC >> 1 speciated as coag negative staph, the other pending Respiratory 5/17 >> GPC clusters >>   Antimicrobials:  Remdesivir 5/11 >> 5/15 Dexamethasone 5/11 >> Tocilizumab 5/12 Cefepime 5/17 >> Linezolid 5/17 >>   Interim history/subjective:  No events, remains more hypoxic when supine. Remains on paralytic.  Objective   Blood pressure 119/77, pulse (!) 107, temperature 99 F (37.2 C), temperature source  Axillary, resp. rate (!) 30, height 5\' 7"  (1.702 m), weight (!) 172.6 kg, SpO2 99 %.    Vent Mode: PRVC FiO2 (%):  [100 %] 100 % Set Rate:  [30 bmp-35 bmp] 30 bmp Vt Set:  [460 mL-560 mL] 560 mL PEEP:  [16 cmH20-22 cmH20] 16 cmH20 Plateau Pressure:  [32 cmH20-38 cmH20] 33 cmH20   Intake/Output Summary (Last 24 hours) at 12/13/2019 0724 Last data filed at 12/13/2019 0600 Gross per 24 hour  Intake 3944.13 ml  Output 3240 ml  Net 704.13 ml   Filed Weights   12/11/19 0325 12/12/19 0500 12/13/19 0500  Weight: (!) 179.6 kg (!) 168.8 kg (!) 172.6 kg    Examination: GEN: proned obese man on vent HEENT: ETT in place, minimal secretions CV: Prone PULM: Diminished BL, passive on vent GI: Proned, rectal tube in place EXT: trace edema NEURO: paralyzed PSYCH: BIS 45 SKIN: No rashes  Sodium, Cr improved CBC stable except slightly decreased platelets CXR stable R>L airspace disease [facility], receiving high amounts of IVF so net even Sugars look good  Resolved Hospital Problem list    Left proptosis, noted with coughing early 5/12, resolved.  Apparently per wife he has episodic intermittent proptosis at home as well. Head CT 5/12 reassuring Ophthalmology did evaluate, recommended that he will need outpatient evaluation but no intervention at this time beyond keeping the eyes moist.  Assessment & Plan:   ARDS due to COVID-19 pneumonia  Morbid obesity, possible OSA.  Will certainly impact respiratory mechanics - Usual lung protective ventilation strategy - Continue prone/supine pattern  16:8 hours while P/F ratio <150 - VAP prevention bundle - s/p remdesivir, actemra, dexamethasone - Not an ECMO candidate due to size  Acute toxic metabolic encephalopathy, need for sedation -Continue deep sedation per PAD protocol, goal RASS -4, currently fentanyl, midazolam, and ketamine. Plan to try to wean the ketamine to off on 5/18 -Enteral clonazepam, oxycodone as ordered  Septic shock not  present on admission, fever.  Due to COVID-19 pneumonia.  Question Staph epi pneumonia.  Fever curve improved. - Zyvox x 7 days  AKI, improved, hypernatremia - enteral free water, giving another 500cc d5w today as well as a dose of metolazone  Thrombocytopenia- mild, keep an eye on  Best practice:  Diet: resume TF Pain/Anxiety/Delirium protocol (if indicated): 5/11 > fentanyl midazolam, ttirate to BIS 40-60 VAP protocol (if indicated): 5/11 DVT prophylaxis: enoxaparin 0.4 mg/kg qday GI prophylaxis: pepcid Glucose control: SSI protocol Mobility: BR Code Status: Full Family Communication: called and updated wife 5/21, there was some question of transfer to [facility], we discussed that unless things headed wrong direction we would continue course for now, she would appreciate a daily update from either nurse or physician Disposition: ICU  The patient is critically ill with multiple organ systems failure and requires high complexity decision making for assessment and support, frequent evaluation and titration of therapies, application of advanced monitoring technologies and extensive interpretation of multiple databases. Critical Care Time devoted to patient care services described in this note independent of APP/resident time (if applicable)  is 38 minutes.   Myrla Halsted MD [place] [facility] 12/13/2019 7:24 AM Personal pager: [phone] If unanswered, please page CCM On-call: #[phone]

## 2019-12-13 NOTE — Progress Notes (Signed)
Patient worsened sats supined. CXR reviewed, new RUL infiltrate Ongoing bile from mouth, likely aspiration event Will start zosyn given how tenuous he is. Vent adjusted to bivent with some recruitment maneuvers.  Myrla Halsted MD PCCM

## 2019-12-13 NOTE — Progress Notes (Signed)
Settings changed per MD ?

## 2019-12-13 NOTE — Progress Notes (Signed)
eLink Physician-Brief Progress Note Patient Name: Clifford Nichols DOB: [birth year] MRN: [id number]   Date of Service  12/13/2019  HPI/Events of Note  ABG on 100%/BiLevel /TV 560/P 20 = 7.159/>97/160.  eICU Interventions  Will order: 1. 100%/PRVC 30/TV 450/P 20. 2. Repeat ABG at 9:30 PM.     Intervention Category Major Interventions: Acid-Base disturbance - evaluation and management;Hypoxemia - evaluation and management;Respiratory failure - evaluation and management  [name] 12/13/2019, 8:09 PM

## 2019-12-13 NOTE — Progress Notes (Signed)
eLink Physician-Brief Progress Note Patient Name: Clifford Nichols DOB: 10-Nov-1980 MRN: [id number]   Date of Service  12/13/2019  HPI/Events of Note  ABG on 100%/PRVC 30/TV 450/P 20 = 7.22/88.0/201.  eICU Interventions  Plan: 1. Continue current ventilator settings.  2. NaHCO3 IV infusion at 50 mL/hour. 3. Repeat ABG at 5 AM.        [name] 12/13/2019, 9:56 PM

## 2019-12-14 ENCOUNTER — Inpatient Hospital Stay (HOSPITAL_COMMUNITY): Payer: No Typology Code available for payment source

## 2019-12-14 LAB — CBC
HCT: 37.3 % — ABNORMAL LOW (ref 39.0–52.0)
Hemoglobin: 10.3 g/dL — ABNORMAL LOW (ref 13.0–17.0)
MCH: 31.8 pg (ref 26.0–34.0)
MCHC: 27.6 g/dL — ABNORMAL LOW (ref 30.0–36.0)
MCV: 115.1 fL — ABNORMAL HIGH (ref 80.0–100.0)
Platelets: 77 10*3/uL — ABNORMAL LOW (ref 150–400)
RBC: 3.24 MIL/uL — ABNORMAL LOW (ref 4.22–5.81)
RDW: 13.3 % (ref 11.5–15.5)
WBC: 14.9 10*3/uL — ABNORMAL HIGH (ref 4.0–10.5)
nRBC: 0.1 % (ref 0.0–0.2)

## 2019-12-14 LAB — BASIC METABOLIC PANEL
Anion gap: 10 (ref 5–15)
BUN: 60 mg/dL — ABNORMAL HIGH (ref 6–20)
CO2: 31 mmol/L (ref 22–32)
Calcium: 8.8 mg/dL — ABNORMAL LOW (ref 8.9–10.3)
Chloride: 111 mmol/L (ref 98–111)
Creatinine, Ser: 1.94 mg/dL — ABNORMAL HIGH (ref 0.61–1.24)
GFR calc Af Amer: 50 mL/min — ABNORMAL LOW (ref >60)
GFR calc non Af Amer: 43 mL/min — ABNORMAL LOW (ref >60)
Glucose, Bld: 177 mg/dL — ABNORMAL HIGH (ref 70–99)
Potassium: 4.2 mmol/L (ref 3.5–5.1)
Sodium: 152 mmol/L — ABNORMAL HIGH (ref 135–145)

## 2019-12-14 LAB — POCT I-STAT 7, (LYTES, BLD GAS, ICA,H+H)
Acid-Base Excess: 7 mmol/L — ABNORMAL HIGH (ref 0.0–2.0)
Acid-Base Excess: 6 mmol/L — ABNORMAL HIGH (ref 0.0–2.0)
Bicarbonate: 35.7 mmol/L — ABNORMAL HIGH (ref 20.0–28.0)
Bicarbonate: 34.7 mmol/L — ABNORMAL HIGH (ref 20.0–28.0)
Calcium, Ion: 1.27 mmol/L (ref 1.15–1.40)
Calcium, Ion: 1.25 mmol/L (ref 1.15–1.40)
HCT: 32 % — ABNORMAL LOW (ref 39.0–52.0)
HCT: 30 % — ABNORMAL LOW (ref 39.0–52.0)
Hemoglobin: 10.9 g/dL — ABNORMAL LOW (ref 13.0–17.0)
Hemoglobin: 10.2 g/dL — ABNORMAL LOW (ref 13.0–17.0)
O2 Saturation: 100 %
O2 Saturation: 96 %
Patient temperature: 99
Patient temperature: 100
Potassium: 4.1 mmol/L (ref 3.5–5.1)
Potassium: 3.9 mmol/L (ref 3.5–5.1)
Sodium: 151 mmol/L — ABNORMAL HIGH (ref 135–145)
Sodium: 149 mmol/L — ABNORMAL HIGH (ref 135–145)
TCO2: 38 mmol/L — ABNORMAL HIGH (ref 22–32)
TCO2: 37 mmol/L — ABNORMAL HIGH (ref 22–32)
pCO2 arterial: 75.6 mmHg (ref 32.0–48.0)
pCO2 arterial: 74.9 mmHg (ref 32.0–48.0)
pH, Arterial: 7.284 — ABNORMAL LOW (ref 7.350–7.450)
pH, Arterial: 7.277 — ABNORMAL LOW (ref 7.350–7.450)
pO2, Arterial: 230 mmHg — ABNORMAL HIGH (ref 83.0–108.0)
pO2, Arterial: 100 mmHg (ref 83.0–108.0)

## 2019-12-14 LAB — MAGNESIUM: Magnesium: 2.3 mg/dL (ref 1.7–2.4)

## 2019-12-14 LAB — GLUCOSE, CAPILLARY
Glucose-Capillary: 143 mg/dL — ABNORMAL HIGH (ref 70–99)
Glucose-Capillary: 154 mg/dL — ABNORMAL HIGH (ref 70–99)
Glucose-Capillary: 155 mg/dL — ABNORMAL HIGH (ref 70–99)
Glucose-Capillary: 165 mg/dL — ABNORMAL HIGH (ref 70–99)
Glucose-Capillary: 167 mg/dL — ABNORMAL HIGH (ref 70–99)
Glucose-Capillary: 173 mg/dL — ABNORMAL HIGH (ref 70–99)
Glucose-Capillary: 193 mg/dL — ABNORMAL HIGH (ref 70–99)

## 2019-12-14 LAB — PHOSPHORUS: Phosphorus: 4.4 mg/dL (ref 2.5–4.6)

## 2019-12-14 MED ORDER — SODIUM CHLORIDE 0.9 % IV SOLN
0.5000 mg/kg/h | INTRAVENOUS | Status: DC
  Administered 2019-12-14 – 2019-12-18 (×9): 0.5 mg/kg/h via INTRAVENOUS
  Filled 2019-12-14: qty 5
  Filled 2019-12-14 (×4): qty 12.5
  Filled 2019-12-14: qty 5
  Filled 2019-12-14: qty 12.5
  Filled 2019-12-14: qty 5
  Filled 2019-12-14 (×5): qty 12.5
  Filled 2019-12-14 (×2): qty 5

## 2019-12-14 NOTE — Progress Notes (Signed)
Patient placed in supine position by RT x 1 and RN x 4 without any complications.  ETT re-secured with a commercial tube holder on the right. FIO2 increased to 100% due to desat following the patient turn.

## 2019-12-14 NOTE — Progress Notes (Signed)
Morning abg reported to box. Results will be relayed to MD.

## 2019-12-14 NOTE — Progress Notes (Signed)
Critical ABG values given to Dr. Delton Coombes.

## 2019-12-14 NOTE — Progress Notes (Signed)
eLink Physician-Brief Progress Note Patient Name: Clifford Nichols DOB: 24-Sep-1980 MRN: [id number]   Date of Service  12/14/2019  HPI/Events of Note  ABG on 100%/PRVC 30/TV 450/P 20 = 7.28/75.6/230.   eICU Interventions  Continue present ventilator management.      Intervention Category Major Interventions: Acid-Base disturbance - evaluation and management;Respiratory failure - evaluation and management  [name] 12/14/2019, 4:58 AM

## 2019-12-14 NOTE — Progress Notes (Signed)
Patient's ETT re-secured by cloth tape at 24 in the center with foam padding placed on cheeks and upper lip.  Patient placed in prone position by RT x 1, NT x 1, and RN x 5 without complications.  Patient's head is turned to the right with his right arm up.

## 2019-12-14 NOTE — Progress Notes (Signed)
NAME:  Clifford Nichols, MRN:  [id number], DOB:  09/27/1980, LOS: 52 [facility] ADMISSION DATE:  12/04/2019, [facility] Admission DATE:  12/04/19 REFERRING MD:  Dr Mortimer Fries, CCM, CHIEF COMPLAINT:  Acute respiratory failure   Brief History   39 year old obese man with hypertension, diabetes admitted with acute respiratory failure, ARDS from COVID-19 pneumonia, initial positive test 5/10   Past Medical History   Past Medical History:  Diagnosis Date  . Abscess of upper back excluding scapular region   . Diabetes mellitus without complication ([place])   . GERD (gastroesophageal reflux disease)    OCC-NO MEDS  . Hypertension   . Sebaceous cyst     Significant Hospital Events   5/10 admitted [facility]  5/11 failed HHFNC, BiPAP.  Required intubation 5/12 transferred to [facility] 5/13 High PEEP and FiO2 requirement through the night Had continued issues with left proptosis, prone position deferred due to this  Consults:  Ophthalmology for proptosis 5/12  Procedures:  ETT 5/11 >>  L femoral CVC 5/11 >> 5/17 L IJ CVC 5/17 >>  R radial art line 5/17 >>    Significant Diagnostic Tests:  Head CT 5/11 >> no significant intracranial abnormality.  Scattered opacification and air-fluid levels in the frontal, ethmoid, sphenoid and maxillary sinuses of unclear significance. Head CT 5/12 >> no significant intracranial abnormality Lower extremity Doppler 5/14 >> negative for DVT  Micro Data:  SARS CoV2 5/10 >> positive MRSA screen 5/12 >> negative Respiratory 5/12 >> negative Blood 5/17 >> 2 of 2 GPC >> 1 speciated as coag negative staph, the other pending Respiratory 5/17 >> GPC clusters >>   Antimicrobials:  Remdesivir 5/11 >> 5/15 Dexamethasone 5/11 >> Tocilizumab 5/12 Cefepime 5/17 >> 5/21 Linezolid 5/17 >> Zosyn 5/21 >>   Interim history/subjective:   I/O+ 9.6 L total Was on bilevel ventilation with recruitment maneuvers overnight, changed to PRVC due to respiratory acidosis.  Bicarbonate infusion  added.  Current vent PRVC 450x30, 1.00, PEEP 20 Fentanyl 300, midazolam 10, norepinephrine 13, propofol 70, cisatracurium  hypertriglyceridemia noted  Objective   Blood pressure 119/77, pulse (!) 103, temperature 99 F (37.2 C), temperature source Axillary, resp. rate (!) 30, height 5\' 7"  (1.702 m), weight (!) 173.8 kg, SpO2 95 %.    Vent Mode: PRVC FiO2 (%):  [100 %] 100 % Set Rate:  [30 bmp] 30 bmp Vt Set:  [450 mL-560 mL] 450 mL PEEP:  [16 cmH20-20 cmH20] 20 cmH20 Plateau Pressure:  [33 cmH20-45 cmH20] 45 cmH20   Intake/Output Summary (Last 24 hours) at 12/14/2019 4098 Last data filed at 12/14/2019 0600 Gross per 24 hour  Intake 6340.98 ml  Output 3730 ml  Net 2610.98 ml   Filed Weights   12/12/19 0500 12/13/19 0500 12/14/19 0500  Weight: (!) 168.8 kg (!) 172.6 kg (!) 173.8 kg    Examination: GEN: Supine position, obese, ill-appearing HEENT: ET tube in good position, OG tube to suction with brownish secretions CV: Distant, regular, tachycardic PULM: Decreased bilateral breath sounds, scattered rhonchi GI:   Obese, nondistended, hypoactive bowel sounds EXT: Trace pretibial edema NEURO: Sedated, paralyzed SKIN: No rash   Resolved Hospital Problem list    Left proptosis, noted with coughing early 5/12, resolved.  Apparently per wife he has episodic intermittent proptosis at home as well. Head CT 5/12 reassuring Ophthalmology did evaluate, recommended that he will need outpatient evaluation but no intervention at this time beyond keeping the eyes moist.  Assessment & Plan:   ARDS due to COVID-19 pneumonia  Morbid obesity, possible OSA.  Will certainly impact respiratory mechanics Continue PRVC and wean FiO2 PEEP as able on 5/22 Continue deep sedation and paralytic Continue cycle prone positioning Completed remdesivir, dexamethasone, Tocilizumab Not an ECMO candidate VAP prevention order set Plan to stop bicarbonate infusion on 5/22 if able, follow ABG  Acute toxic  metabolic encephalopathy, need for sedation Deep sedation: Midazolam, fentanyl, propofol Continue paralytic Enteral clonazepam and oxycodone as ordered  Septic shock not present on admission, fever.  Due to COVID-19 pneumonia.  Question Staph epi pneumonia.  Fever curve improved. Linezolid day 6 of 7 Cefepime transition to Zosyn on 5/21 given concern for possible recurrent aspiration event.  AKI, improved, hypernatremia Follow BMP, urine output Diuresis as he can tolerate based on renal function and hemodynamics Free water as ordered  Thrombocytopenia-  Follow CBC and for any evidence of active bleeding  Hypertriglyceridemia Will need to transition off propofol continue alternative sedation.  May need to consider ketamine  Best practice:  Diet: TF Pain/Anxiety/Delirium protocol (if indicated): 5/11 > fentanyl midazolam, propofol ttirate to BIS 40-60.  Will need to come off propofol 5/22 due to triglycerides VAP protocol (if indicated): 5/11 DVT prophylaxis: enoxaparin 0.4 mg/kg qday GI prophylaxis: pepcid Glucose control: SSI protocol Mobility: BR Code Status: Full Family Communication: discussed with patient's wife by phone on 5/22 Disposition: ICU  The patient is critically ill with multiple organ systems failure and requires high complexity decision making for assessment and support, frequent evaluation and titration of therapies, application of advanced monitoring technologies and extensive interpretation of multiple databases. Critical Care Time devoted to patient care services described in this note independent of APP/resident time (if applicable)  is 33 minutes.    Levy Pupa, MD, PhD 12/14/2019, 6:47 AM [facility] [phone] or if no answer [phone]

## 2019-12-14 NOTE — Progress Notes (Signed)
Assisted tele visit to patient with family member.  Dareld Mcauliffe M, RN  

## 2019-12-14 NOTE — Progress Notes (Signed)
Video call with wife and daughter. Patient paralyzed and sedated, unable to communicate.

## 2019-12-14 NOTE — Progress Notes (Signed)
Patient's head and arms repositioned without complications.  

## 2019-12-14 NOTE — Progress Notes (Signed)
Video call with wife complete.  

## 2019-12-14 NOTE — Progress Notes (Signed)
Assisted tele visit to patient with family member.  Angelino Rumery M, RN  

## 2019-12-14 NOTE — Progress Notes (Signed)
eLink Physician-Brief Progress Note Patient Name: Clifford Nichols DOB: 1980/08/13 MRN: [id number]   Date of Service  12/14/2019  HPI/Events of Note  Nursing request for PRN NMB on top of NMB IV infusion. TOF = 0. He doesn't need more NMB. BIS = 60.   eICU Interventions  Will order: 1. Increase ceiling on Fentanyl IV infusion to 400 mcg/hour.      Intervention Category Major Interventions: Delirium, psychosis, severe agitation - evaluation and management  [name] 12/14/2019, 8:31 PM

## 2019-12-15 ENCOUNTER — Inpatient Hospital Stay (HOSPITAL_COMMUNITY): Payer: No Typology Code available for payment source

## 2019-12-15 LAB — POCT I-STAT 7, (LYTES, BLD GAS, ICA,H+H)
Acid-Base Excess: 7 mmol/L — ABNORMAL HIGH (ref 0.0–2.0)
Acid-Base Excess: 8 mmol/L — ABNORMAL HIGH (ref 0.0–2.0)
Bicarbonate: 35.2 mmol/L — ABNORMAL HIGH (ref 20.0–28.0)
Bicarbonate: 36.1 mmol/L — ABNORMAL HIGH (ref 20.0–28.0)
Calcium, Ion: 1.27 mmol/L (ref 1.15–1.40)
Calcium, Ion: 1.29 mmol/L (ref 1.15–1.40)
HCT: 27 % — ABNORMAL LOW (ref 39.0–52.0)
HCT: 25 % — ABNORMAL LOW (ref 39.0–52.0)
Hemoglobin: 9.2 g/dL — ABNORMAL LOW (ref 13.0–17.0)
Hemoglobin: 8.5 g/dL — ABNORMAL LOW (ref 13.0–17.0)
O2 Saturation: 98 %
O2 Saturation: 98 %
Patient temperature: 100.3
Patient temperature: 99.8
Potassium: 3.8 mmol/L (ref 3.5–5.1)
Potassium: 3.6 mmol/L (ref 3.5–5.1)
Sodium: 153 mmol/L — ABNORMAL HIGH (ref 135–145)
Sodium: 153 mmol/L — ABNORMAL HIGH (ref 135–145)
TCO2: 37 mmol/L — ABNORMAL HIGH (ref 22–32)
TCO2: 38 mmol/L — ABNORMAL HIGH (ref 22–32)
pCO2 arterial: 74.3 mmHg (ref 32.0–48.0)
pCO2 arterial: 73.2 mmHg (ref 32.0–48.0)
pH, Arterial: 7.288 — ABNORMAL LOW (ref 7.350–7.450)
pH, Arterial: 7.305 — ABNORMAL LOW (ref 7.350–7.450)
pO2, Arterial: 134 mmHg — ABNORMAL HIGH (ref 83.0–108.0)
pO2, Arterial: 117 mmHg — ABNORMAL HIGH (ref 83.0–108.0)

## 2019-12-15 LAB — BASIC METABOLIC PANEL
Anion gap: 6 (ref 5–15)
BUN: 58 mg/dL — ABNORMAL HIGH (ref 6–20)
CO2: 32 mmol/L (ref 22–32)
Calcium: 8.6 mg/dL — ABNORMAL LOW (ref 8.9–10.3)
Chloride: 114 mmol/L — ABNORMAL HIGH (ref 98–111)
Creatinine, Ser: 2.18 mg/dL — ABNORMAL HIGH (ref 0.61–1.24)
GFR calc Af Amer: 44 mL/min — ABNORMAL LOW (ref >60)
GFR calc non Af Amer: 38 mL/min — ABNORMAL LOW (ref >60)
Glucose, Bld: 145 mg/dL — ABNORMAL HIGH (ref 70–99)
Potassium: 3.7 mmol/L (ref 3.5–5.1)
Sodium: 152 mmol/L — ABNORMAL HIGH (ref 135–145)

## 2019-12-15 LAB — GLUCOSE, CAPILLARY
Glucose-Capillary: 128 mg/dL — ABNORMAL HIGH (ref 70–99)
Glucose-Capillary: 138 mg/dL — ABNORMAL HIGH (ref 70–99)
Glucose-Capillary: 168 mg/dL — ABNORMAL HIGH (ref 70–99)
Glucose-Capillary: 126 mg/dL — ABNORMAL HIGH (ref 70–99)
Glucose-Capillary: 128 mg/dL — ABNORMAL HIGH (ref 70–99)
Glucose-Capillary: 162 mg/dL — ABNORMAL HIGH (ref 70–99)

## 2019-12-15 LAB — CBC
HCT: 31.9 % — ABNORMAL LOW (ref 39.0–52.0)
Hemoglobin: 8.5 g/dL — ABNORMAL LOW (ref 13.0–17.0)
MCH: 30.2 pg (ref 26.0–34.0)
MCHC: 26.6 g/dL — ABNORMAL LOW (ref 30.0–36.0)
MCV: 113.5 fL — ABNORMAL HIGH (ref 80.0–100.0)
Platelets: 75 10*3/uL — ABNORMAL LOW (ref 150–400)
RBC: 2.81 MIL/uL — ABNORMAL LOW (ref 4.22–5.81)
RDW: 13.4 % (ref 11.5–15.5)
WBC: 13.8 10*3/uL — ABNORMAL HIGH (ref 4.0–10.5)
nRBC: 0.4 % — ABNORMAL HIGH (ref 0.0–0.2)

## 2019-12-15 LAB — PHOSPHORUS: Phosphorus: 4 mg/dL (ref 2.5–4.6)

## 2019-12-15 LAB — MAGNESIUM: Magnesium: 2.2 mg/dL (ref 1.7–2.4)

## 2019-12-15 NOTE — Progress Notes (Signed)
Assisted tele visit to patient with family member.  Terria Deschepper M, RN  

## 2019-12-15 NOTE — Progress Notes (Signed)
Patient's head and arms repositioned without complications.  

## 2019-12-15 NOTE — Progress Notes (Signed)
Patient cleaned up and repositioned. Cuff leak present and patient was not getting tidal volumes. Air added and tube repositioned- cuff leak still present. Patient repositioned and cuff leak no longer present.

## 2019-12-15 NOTE — Progress Notes (Signed)
Patient's ETT re-secured 28 in the center with cloth tape and padding dressing on the cheeks and upper lip.  Patient placed in prone position by RT x 1, NT x 1, and RN x 3 without complications.

## 2019-12-15 NOTE — Progress Notes (Signed)
Patient placed in supine position by MD x 1, RT x 1, RN x 4, and NT x 1 without complications.  Due to desat, FIO2 increased to 100%. Will titrate as able.  ETT re-secured by a commercial tube holder and advanced to 28 due to a large cuff leak.  MD notified and CXR ordered. Patient has bilateral breath sounds.

## 2019-12-15 NOTE — Progress Notes (Signed)
Pt BIS 63 despite receiving maximum sedation, boluses as ordered. Dr. Delton Coombes notified. Dr. Delton Coombes stated to be permissive with BIS at this level for now.

## 2019-12-15 NOTE — Progress Notes (Signed)
Assisted tele visit to patient with family member.  Clifford Nichols M, RN  

## 2019-12-15 NOTE — Progress Notes (Signed)
eLink Physician-Brief Progress Note Patient Name: Clifford Nichols DOB: [birth year] MRN: [id number]   Date of Service  12/15/2019  HPI/Events of Note  Nursing and RT report cuff leak .  eICU Interventions  PCCM ground team notified to evaluate the patient at bedside.      Intervention Category Major Interventions: Other:  Lanesha Azzaro Dennard Nip 12/15/2019, 1:20 AM

## 2019-12-15 NOTE — Progress Notes (Signed)
NAME:  Clifford Nichols, MRN:  [id number], DOB:  1981/03/17, LOS: 56 [facility] ADMISSION DATE:  12/04/2019, [facility] Admission DATE:  12/04/19 REFERRING MD:  Dr Mortimer Fries, CCM, CHIEF COMPLAINT:  Acute respiratory failure   Brief History   39 year old obese man with hypertension, diabetes admitted with acute respiratory failure, ARDS from COVID-19 pneumonia, initial positive test 5/10   Past Medical History   Past Medical History:  Diagnosis Date  . Abscess of upper back excluding scapular region   . Diabetes mellitus without complication ([place])   . GERD (gastroesophageal reflux disease)    OCC-NO MEDS  . Hypertension   . Sebaceous cyst     Significant Hospital Events   5/10 admitted [facility]  5/11 failed HHFNC, BiPAP.  Required intubation 5/12 transferred to [facility] 5/13 High PEEP and FiO2 requirement through the night Had continued issues with left proptosis, prone position deferred due to this  Consults:  Ophthalmology for proptosis 5/12  Procedures:  ETT 5/11 >>  L femoral CVC 5/11 >> 5/17 L IJ CVC 5/17 >>  R radial art line 5/17 >>    Significant Diagnostic Tests:  Head CT 5/11 >> no significant intracranial abnormality.  Scattered opacification and air-fluid levels in the frontal, ethmoid, sphenoid and maxillary sinuses of unclear significance. Head CT 5/12 >> no significant intracranial abnormality Lower extremity Doppler 5/14 >> negative for DVT  Micro Data:  SARS CoV2 5/10 >> positive MRSA screen 5/12 >> negative Respiratory 5/12 >> negative Blood 5/17 >> 2 of 2 GPC >> 1 speciated as coag negative staph, the other pending Respiratory 5/17 >> GPC clusters >>   Antimicrobials:  Remdesivir 5/11 >> 5/15 Dexamethasone 5/11 >> Tocilizumab 5/12 Cefepime 5/17 >> 5/21 Linezolid 5/17 >> 5/23 Zosyn 5/21 >>   Interim history/subjective:   Continues to cycle prone/supine position, moving now to supine position He had intermittent cuff leak that was positional Difficulty with adequate  paralysis last 24 hours Fentanyl 100, ketamine 0.5, midazolam 10, states atracurium Norepinephrine weaned to off FiO2 0.80, PEEP 20 Hypernatremia  Objective   Blood pressure (!) 134/59, pulse (!) 108, temperature 100.1 F (37.8 C), temperature source Axillary, resp. rate (!) 30, height 5\' 7"  (1.702 m), weight (!) 178 kg, SpO2 98 %.    Vent Mode: PRVC FiO2 (%):  [80 %-100 %] 80 % Set Rate:  [30 bmp] 30 bmp Vt Set:  [450 mL] 450 mL PEEP:  [20 cmH20] 20 cmH20 Plateau Pressure:  [30 cmH20-32 cmH20] 31 cmH20   Intake/Output Summary (Last 24 hours) at 12/15/2019 0710 Last data filed at 12/15/2019 0600 Gross per 24 hour  Intake 6219.54 ml  Output 3360 ml  Net 2859.54 ml   Filed Weights   12/13/19 0500 12/14/19 0500 12/15/19 0500  Weight: (!) 172.6 kg (!) 173.8 kg (!) 178 kg    Examination: GEN: Supine position, obese, ill-appearing HEENT: ET tube in good position, OG tube to suction with brownish secretions CV: Distant, regular, tachycardic PULM: Decreased bilateral breath sounds, scattered rhonchi GI:   Obese, nondistended, hypoactive bowel sounds EXT: Trace pretibial edema NEURO: Sedated, paralyzed SKIN: No rash   Resolved Hospital Problem list    Left proptosis, noted with coughing early 5/12, resolved.  Apparently per wife he has episodic intermittent proptosis at home as well. Head CT 5/12 reassuring Ophthalmology did evaluate, recommended that he will need outpatient evaluation but no intervention at this time beyond keeping the eyes moist.  Assessment & Plan:   ARDS due to COVID-19 pneumonia  Morbid obesity, possible OSA.  Will certainly impact respiratory mechanics Continue PRVC, wean FiO2, PEEP as able Cycling prone position Deep sedation with paralytic.  Propofol was changed to ketamine on 5/22 for hypertriglyceridemia Completed remdesivir, dexamethasone, Tocilizumab Not an ECMO candidate VAP prevention order set in place Guarded prognosis given failure to  progress Chest x-ray now to evaluate ET tube position after transition to supine  Acute toxic metabolic encephalopathy, need for sedation Deep sedation: Midazolam, fentanyl, ketamine Continue paralysis Continue enteral clonazepam and oxycodone as ordered  Septic shock not present on admission, fever.  Due to COVID-19 pneumonia. Staph epi pneumonia and possible bacteremia (? Contaminant)  Complete Linezolid, day 7 of 7 Cefepime transition to Zosyn on 5/21 given concern for possible recurrent aspiration event.  Plan to continue Zosyn, 7-day course  AKI, improved, Hypernatremia Following BMP, urine output Free water as ordered Deferring diuresis for now, would restart when he can tolerate based on renal function and hemodynamics.  Pressor needs are improved.  Could consider volume removal with CVVHD if unable to tolerate restart diuresis  Thrombocytopenia- plt stable 75 on 5/23 Follow CBC Follow for any evidence of active bleeding  Hypertriglyceridemia Transitioned off propofol to ketamine Check triglycerides on 5/24  Best practice:  Diet: TF Pain/Anxiety/Delirium protocol (if indicated): 5/11 > fentanyl midazolam, ketamine VAP protocol (if indicated): 5/11 DVT prophylaxis: enoxaparin 0.4 mg/kg qday GI prophylaxis: pepcid Glucose control: SSI protocol Mobility: BR Code Status: Full Family Communication: discussed with patient's wife by phone on 5/23 Disposition: ICU  The patient is critically ill with multiple organ systems failure and requires high complexity decision making for assessment and support, frequent evaluation and titration of therapies, application of advanced monitoring technologies and extensive interpretation of multiple databases. Critical Care Time devoted to patient care services described in this note independent of APP/resident time (if applicable)  is 32 minutes.    Levy Pupa, MD, PhD 12/15/2019, 7:10 AM [facility] [phone]  or if no answer [phone]

## 2019-12-15 NOTE — Progress Notes (Signed)
Assisted tele visit to patient with wife.  Thomas, Ellenie Salome Renee, RN   

## 2019-12-16 ENCOUNTER — Inpatient Hospital Stay (HOSPITAL_COMMUNITY): Payer: No Typology Code available for payment source

## 2019-12-16 LAB — POCT I-STAT 7, (LYTES, BLD GAS, ICA,H+H)
Acid-Base Excess: 10 mmol/L — ABNORMAL HIGH (ref 0.0–2.0)
Acid-Base Excess: 7 mmol/L — ABNORMAL HIGH (ref 0.0–2.0)
Bicarbonate: 36.9 mmol/L — ABNORMAL HIGH (ref 20.0–28.0)
Bicarbonate: 34.5 mmol/L — ABNORMAL HIGH (ref 20.0–28.0)
Calcium, Ion: 1.29 mmol/L (ref 1.15–1.40)
Calcium, Ion: 1.29 mmol/L (ref 1.15–1.40)
HCT: 23 % — ABNORMAL LOW (ref 39.0–52.0)
HCT: 24 % — ABNORMAL LOW (ref 39.0–52.0)
Hemoglobin: 7.8 g/dL — ABNORMAL LOW (ref 13.0–17.0)
Hemoglobin: 8.2 g/dL — ABNORMAL LOW (ref 13.0–17.0)
O2 Saturation: 100 %
O2 Saturation: 90 %
Patient temperature: 101.2
Patient temperature: 99.7
Potassium: 3.5 mmol/L (ref 3.5–5.1)
Potassium: 3.2 mmol/L — ABNORMAL LOW (ref 3.5–5.1)
Sodium: 155 mmol/L — ABNORMAL HIGH (ref 135–145)
Sodium: 155 mmol/L — ABNORMAL HIGH (ref 135–145)
TCO2: 39 mmol/L — ABNORMAL HIGH (ref 22–32)
TCO2: 36 mmol/L — ABNORMAL HIGH (ref 22–32)
pCO2 arterial: 70.4 mmHg (ref 32.0–48.0)
pCO2 arterial: 66.8 mmHg (ref 32.0–48.0)
pH, Arterial: 7.334 — ABNORMAL LOW (ref 7.350–7.450)
pH, Arterial: 7.323 — ABNORMAL LOW (ref 7.350–7.450)
pO2, Arterial: 274 mmHg — ABNORMAL HIGH (ref 83.0–108.0)
pO2, Arterial: 69 mmHg — ABNORMAL LOW (ref 83.0–108.0)

## 2019-12-16 LAB — BASIC METABOLIC PANEL
Anion gap: 7 (ref 5–15)
BUN: 61 mg/dL — ABNORMAL HIGH (ref 6–20)
CO2: 32 mmol/L (ref 22–32)
Calcium: 8.8 mg/dL — ABNORMAL LOW (ref 8.9–10.3)
Chloride: 118 mmol/L — ABNORMAL HIGH (ref 98–111)
Creatinine, Ser: 2.2 mg/dL — ABNORMAL HIGH (ref 0.61–1.24)
GFR calc Af Amer: 43 mL/min — ABNORMAL LOW (ref >60)
GFR calc non Af Amer: 37 mL/min — ABNORMAL LOW (ref >60)
Glucose, Bld: 132 mg/dL — ABNORMAL HIGH (ref 70–99)
Potassium: 3.3 mmol/L — ABNORMAL LOW (ref 3.5–5.1)
Sodium: 157 mmol/L — ABNORMAL HIGH (ref 135–145)

## 2019-12-16 LAB — BODY FLUID CELL COUNT WITH DIFFERENTIAL
Eos, Fluid: 0 %
Lymphs, Fluid: 6 %
Monocyte-Macrophage-Serous Fluid: 5 % — ABNORMAL LOW (ref 50–90)
Neutrophil Count, Fluid: 89 % — ABNORMAL HIGH (ref 0–25)
Total Nucleated Cell Count, Fluid: 350 cu mm (ref 0–1000)

## 2019-12-16 LAB — CBC
HCT: 29.7 % — ABNORMAL LOW (ref 39.0–52.0)
Hemoglobin: 7.7 g/dL — ABNORMAL LOW (ref 13.0–17.0)
MCH: 29.8 pg (ref 26.0–34.0)
MCHC: 25.9 g/dL — ABNORMAL LOW (ref 30.0–36.0)
MCV: 115.1 fL — ABNORMAL HIGH (ref 80.0–100.0)
Platelets: 100 10*3/uL — ABNORMAL LOW (ref 150–400)
RBC: 2.58 MIL/uL — ABNORMAL LOW (ref 4.22–5.81)
RDW: 14.1 % (ref 11.5–15.5)
WBC: 11 10*3/uL — ABNORMAL HIGH (ref 4.0–10.5)
nRBC: 0.5 % — ABNORMAL HIGH (ref 0.0–0.2)

## 2019-12-16 LAB — PHOSPHORUS: Phosphorus: 2.5 mg/dL (ref 2.5–4.6)

## 2019-12-16 LAB — GLUCOSE, CAPILLARY
Glucose-Capillary: 117 mg/dL — ABNORMAL HIGH (ref 70–99)
Glucose-Capillary: 126 mg/dL — ABNORMAL HIGH (ref 70–99)
Glucose-Capillary: 124 mg/dL — ABNORMAL HIGH (ref 70–99)
Glucose-Capillary: 134 mg/dL — ABNORMAL HIGH (ref 70–99)
Glucose-Capillary: 128 mg/dL — ABNORMAL HIGH (ref 70–99)
Glucose-Capillary: 116 mg/dL — ABNORMAL HIGH (ref 70–99)
Glucose-Capillary: 130 mg/dL — ABNORMAL HIGH (ref 70–99)

## 2019-12-16 LAB — TRIGLYCERIDES: Triglycerides: 546 mg/dL — ABNORMAL HIGH (ref <150)

## 2019-12-16 LAB — POTASSIUM: Potassium: 3.4 mmol/L — ABNORMAL LOW (ref 3.5–5.1)

## 2019-12-16 LAB — MAGNESIUM: Magnesium: 2.3 mg/dL (ref 1.7–2.4)

## 2019-12-16 MED ORDER — ACETAMINOPHEN 160 MG/5ML PO SOLN
650.0000 mg | Freq: Four times a day (QID) | ORAL | Status: DC | PRN
  Administered 2019-12-16 – 2020-01-01 (×9): 650 mg
  Filled 2019-12-16 (×10): qty 20.3

## 2019-12-16 MED ORDER — METOPROLOL TARTRATE 5 MG/5ML IV SOLN
2.5000 mg | Freq: Once | INTRAVENOUS | Status: AC
  Administered 2019-12-16: 2.5 mg via INTRAVENOUS
  Filled 2019-12-16: qty 5

## 2019-12-16 MED ORDER — VITAL 1.5 CAL PO LIQD
1000.0000 mL | ORAL | Status: DC
  Filled 2019-12-16: qty 1000

## 2019-12-16 MED ORDER — OXYCODONE HCL 5 MG PO TABS
10.0000 mg | ORAL_TABLET | ORAL | Status: DC
  Administered 2019-12-16 – 2020-01-01 (×96): 10 mg
  Filled 2019-12-16 (×96): qty 2

## 2019-12-16 NOTE — Progress Notes (Signed)
Patient's head and arms repositioned without complications.  

## 2019-12-16 NOTE — Procedures (Signed)
Bronchoscopy Procedure Note Clifford Nichols [id number] 04-Jan-1981  Procedure: Bronchoscopy Indications: Diagnostic evaluation of the airways and Obtain specimens for culture and/or other diagnostic studies. Evaluate for mucus plugs given new left hemithorax opacification.   Procedure Details Consent: Risks of procedure as well as the alternatives and risks of each were explained to the (patient/caregiver).  Consent for procedure obtained. Time Out: Verified patient identification, verified procedure, site/side was marked, verified correct patient position, special equipment/implants available, medications/allergies/relevent history reviewed, required imaging and test results available.  Performed  In preparation for procedure, patient was given 100% FiO2 and bronchoscope lubricated. Sedation: Benzodiazepines  Airway entered and the following bronchi were examined: RUL, RML, RLL, LUL, LLL and Bronchi.   Airways are clean with minimal secretions.  Tip of ET tube noted to be at the carina and was repositioned to 3 cm above the carina.  No mucous plugs noted.  Lavage was performed in the left lower lobe and sent for cultures. Bronchoscope removed, Patient placed back on 100% FiO2 at conclusion of procedure.    Findings No mucus plugs Suspect left lung opacity may be because of displacement of ET tube while prone into the right mainstem.  Will repeat chest x-ray while supine. If ABG is good then he may not need to be proned again.  Evaluation Hemodynamic Status: BP stable throughout; O2 sats: stable throughout Patient's Current Condition: stable Complications: No apparent complications Patient did tolerate procedure well.  [name] MD [facility] Please see [email or web address] for pager details.  12/16/2019, 11:56 AM

## 2019-12-16 NOTE — Progress Notes (Signed)
eLink Physician-Brief Progress Note Patient Name: [name] DOB: 1980/08/11 MRN: [id number]   Date of Service  12/16/2019  HPI/Events of Note  ?Pt is heavily sedated > Fentanyl 400 mcg/hr, Versed 10mg /hr, ketamine 0.5mg /kg/hr, Nimbex 55mcg/kg/min Pt is proned, RN concerned about tachycardia and hypertension HR 110, BP 185/97   Camera: Discussed with bed side RN over phone. Obese, proned. In synchrony with ARDS net vent protocol. PEEP 16. sats 100%. HR > 100, under 115. SBP 180. Making urine fine. No fever.   eICU Interventions  - Lopressor 2.5 mg IV once - last K was low. Check potasium evel and call back if low.      Intervention Category Intermediate Interventions: Hypertension - evaluation and management;Arrhythmia - evaluation and management  1m 12/16/2019, 9:26 PM

## 2019-12-16 NOTE — Progress Notes (Signed)
Assisted tele visit to patient with family member.  Khailee Mick M, RN  

## 2019-12-16 NOTE — Progress Notes (Signed)
Assisted tele visit to patient with family member.  Fidela Cieslak M, RN  

## 2019-12-16 NOTE — Progress Notes (Signed)
Patient placed in supine position by MD x 1, RT x 1, and RN x 4 without complications.  Patient bronched and then ETT re-secured by a commercial tube holder at 28 cm on the right.

## 2019-12-16 NOTE — Progress Notes (Signed)
Pharmacy Antibiotic Note  Clifford Nichols is a 39 y.o. male admitted on 12/04/2019 with aspiration PNA.  Pharmacy has been consulted for Zosyn dosing.  Renal function is worsening and MD is considering CRRT.  Tmax 101.2, WBC 11.  CXR is worsening.  Plan: Continue Zosyn EID 3.375gm IV Q8H Monitor renal fxn, clinical progress   Height: 5\' 7"  (170.2 cm) Weight: (!) 178 kg (392 lb 6.7 oz) IBW/kg (Calculated) : 66.1  Temp (24hrs), Avg:100.9 F (38.3 C), Min:99.8 F (37.7 C), Max:101.3 F (38.5 C)  Recent Labs  Lab 12/09/19 1715 12/10/19 0451 12/12/19 0400 12/13/19 0825 12/14/19 0400 12/15/19 0520 12/16/19 0454  WBC  --    < > 12.6* 10.4 14.9* 13.8* 11.0*  CREATININE 2.70*   < > 2.09* 1.82* 1.94* 2.18* 2.20*  LATICACIDVEN 1.6  --   --   --   --   --   --    < > = values in this interval not displayed.    Estimated Creatinine Clearance: 72.8 mL/min (A) (by C-G formula based on SCr of 2.2 mg/dL (H)).    No Known Allergies   Remdesivir 5/10 >> 5/14 Actemra x1 5/12 Zinc / Vit C Decadron >> 5/20  Cefepime 5/17 >> 5/20 Zyvox 5/17 >> 5/23 Zosyn 5/21 >> (5/27)  5/12 TA - negative 5/12 MRSA - negative 5/17 TA - Staph epidermidis (S Cipro, vanc, clinda) 5/17 BCx - Staph hominis and Staph epidermidis, likely contaminant  Clifford Nichols D. 08-31-1980, PharmD, BCPS, BCCCP 12/16/2019, 10:12 AM

## 2019-12-16 NOTE — Progress Notes (Signed)
Patient placed in prone position by RT x 2 and RN x 4 without complications.  Prior to turning, ETT re-secured with cloth tape and foam padding on the cheeks and upper lip.

## 2019-12-16 NOTE — Progress Notes (Signed)
Patient remains proned. ETT secure 28cm @lip  with cloth tape. Head repositioned. Arms rotated.

## 2019-12-16 NOTE — Progress Notes (Signed)
Assisted tele visit to patient with family member.  Christerpher Clos M, RN  

## 2019-12-16 NOTE — Progress Notes (Addendum)
NAME:  Clifford Nichols, MRN:  [id number], DOB:  May 28, 1981, LOS: 65 [facility] ADMISSION DATE:  12/04/2019, [facility] Admission DATE:  12/04/19 REFERRING MD:  Dr Mortimer Fries, CCM, CHIEF COMPLAINT:  Acute respiratory failure   Brief History   39 year old obese man with hypertension, diabetes admitted with acute respiratory failure, ARDS from COVID-19 pneumonia, initial positive test 5/10  Past Medical History    has a past medical history of Abscess of upper back excluding scapular region, Diabetes mellitus without complication ([place]), GERD (gastroesophageal reflux disease), Hypertension, and Sebaceous cyst.  Significant Hospital Events   5/10 admitted [facility]  5/11 failed HHFNC, BiPAP.  Required intubation 5/12 transferred to [facility], evaluated by opthal for proptosis- no intervention required 5/17 Started paralytics, ECMO team consulted Not a candidate 5/19 Staring proning 5/23 Off pressors  Consults:  Ophthalmology for proptosis 5/12  Procedures:  ETT 5/11 >>  L femoral CVC 5/11 >> 5/17 L IJ CVC 5/17 >>  R radial art line 5/17 >>   Significant Diagnostic Tests:  Head CT 5/11 >> no significant intracranial abnormality.  Scattered opacification and air-fluid levels in the frontal, ethmoid, sphenoid and maxillary sinuses of unclear significance.  Head CT 5/12 >> no significant intracranial abnormality  Lower extremity Doppler 5/14 >> negative for DVT  Micro Data:  SARS CoV2 5/10 >> positive MRSA screen 5/12 >> negative Respiratory 5/12 >> negative Blood 5/17 >> Staph epi and Staph hominis Respiratory 5/17 >> Staph epi  Antimicrobials:  Remdesivir 5/11 >> 5/15 Dexamethasone 5/11 >> 5/20 Tocilizumab 5/12 Cefepime 5/17 >> 5/21 Linezolid 5/17 >> 5/23  Zosyn 5/21 >>    Interim history/subjective:   Continues prone position, paralytics Off pressors.  Objective   Blood pressure (!) 134/57, pulse (!) 111, temperature (!) 101.3 F (38.5 C), temperature source Axillary, resp. rate (!) 30,  height 5\' 7"  (1.702 m), weight (!) 178 kg, SpO2 99 %.    Vent Mode: PRVC FiO2 (%):  [70 %-100 %] 70 % Set Rate:  [30 bmp] 30 bmp Vt Set:  [450 mL] 450 mL PEEP:  [16 cmH20-20 cmH20] 16 cmH20 Plateau Pressure:  [28 cmH20-34 cmH20] 28 cmH20   Intake/Output Summary (Last 24 hours) at 12/16/2019 0943 Last data filed at 12/16/2019 0800 Gross per 24 hour  Intake 3755.46 ml  Output 3305 ml  Net 450.46 ml   Filed Weights   12/13/19 0500 12/14/19 0500 12/15/19 0500  Weight: (!) 172.6 kg (!) 173.8 kg (!) 178 kg    Examination: Gen:      No acute distress, obese HEENT:  EOMI, sclera anicteric, ETT Neck:     No masses; no thyromegaly Lungs:    Clear to auscultation bilaterally; normal respiratory effort CV:         Regular rate and rhythm; no murmurs Abd:      + bowel sounds; soft, non-tender; no palpable masses, no distension Ext:    No edema; adequate peripheral perfusion Skin:      Warm and dry; no rash Neuro: Sedated, paralyzed  Lab significant for sodium 157, potassium 3.3, creatinine 2.20 Chest x-ray 5/24-left lung opacification, right upper lobe atelectasis.  Resolved Hospital Problem list    Left proptosis, noted with coughing early 5/12, resolved.  Apparently per wife he has episodic intermittent proptosis at home as well. Head CT 5/12 reassuring Ophthalmology did evaluate, recommended that he will need outpatient evaluation but no intervention at this time beyond keeping the eyes moist.  Assessment & Plan:   ARDS due to COVID-19  pneumonia  Morbid obesity, possible OSA.  Will certainly impact respiratory mechanics Recheck ABG later today when supine. Continue prone positioning if P/F ratio < 150 Deep sedation with paralytic.  Propofol was changed to ketamine on 5/22 for hypertriglyceridemia Completed remdesivir, dexamethasone, Tocilizumab Not an ECMO candidate VAP prevention order set in place Guarded prognosis given failure to progress Will need bronch today to evaluate  lung collapse, mucous plugging  Acute toxic metabolic encephalopathy, need for sedation Deep sedation: Midazolam, fentanyl, ketamine Continue paralysis Continue enteral clonazepam and oxycodone as ordered  Septic shock not present on admission, fever.  Due to COVID-19 pneumonia. Staph epi pneumonia and possible bacteremia (? Contaminant)  Complete Linezolid, day 7 of 7 he finished and then he is on Zosyn Started Zosyn on 5/21 due to concern for recurrent aspiration event  AKI Hypernatremia Following BMP, urine output Resume free water Holding diuresis. May need CRRT is renal function worsens  Thrombocytopenia- plt stable 75 on 5/23 Follow CBC Follow for any evidence of active bleeding  Hypertriglyceridemia Transitioned off propofol to ketamine Follow triglycerides  Best practice:  Diet: TF Pain/Anxiety/Delirium protocol (if indicated): 5/11 > fentanyl midazolam, ketamine VAP protocol (if indicated): 5/11 DVT prophylaxis: enoxaparin 0.4 mg/kg qday GI prophylaxis: pepcid Glucose control: SSI protocol Mobility: BR Code Status: Full Family Communication: discussed with patient's wife by phone on 5/23, Mom updated 5/24 Disposition: ICU  The patient is critically ill with multiple organ system failure and requires high complexity decision making for assessment and support, frequent evaluation and titration of therapies, advanced monitoring, review of radiographic studies and interpretation of complex data.   Critical Care Time devoted to patient care services, exclusive of separately billable procedures, described in this note is 35 minutes.   [name] MD [place] [facility] Please see [email or web address] for pager details.  12/16/2019, 10:08 AM

## 2019-12-17 ENCOUNTER — Inpatient Hospital Stay (HOSPITAL_COMMUNITY): Payer: No Typology Code available for payment source

## 2019-12-17 LAB — BASIC METABOLIC PANEL
Anion gap: 10 (ref 5–15)
BUN: 54 mg/dL — ABNORMAL HIGH (ref 6–20)
CO2: 30 mmol/L (ref 22–32)
Calcium: 8.8 mg/dL — ABNORMAL LOW (ref 8.9–10.3)
Chloride: 115 mmol/L — ABNORMAL HIGH (ref 98–111)
Creatinine, Ser: 1.94 mg/dL — ABNORMAL HIGH (ref 0.61–1.24)
GFR calc Af Amer: 50 mL/min — ABNORMAL LOW (ref >60)
GFR calc non Af Amer: 43 mL/min — ABNORMAL LOW (ref >60)
Glucose, Bld: 147 mg/dL — ABNORMAL HIGH (ref 70–99)
Potassium: 3.4 mmol/L — ABNORMAL LOW (ref 3.5–5.1)
Sodium: 155 mmol/L — ABNORMAL HIGH (ref 135–145)

## 2019-12-17 LAB — POCT I-STAT 7, (LYTES, BLD GAS, ICA,H+H)
Acid-Base Excess: 10 mmol/L — ABNORMAL HIGH (ref 0.0–2.0)
Acid-Base Excess: 9 mmol/L — ABNORMAL HIGH (ref 0.0–2.0)
Acid-Base Excess: 7 mmol/L — ABNORMAL HIGH (ref 0.0–2.0)
Bicarbonate: 37.6 mmol/L — ABNORMAL HIGH (ref 20.0–28.0)
Bicarbonate: 35.2 mmol/L — ABNORMAL HIGH (ref 20.0–28.0)
Bicarbonate: 32.7 mmol/L — ABNORMAL HIGH (ref 20.0–28.0)
Calcium, Ion: 1.29 mmol/L (ref 1.15–1.40)
Calcium, Ion: 1.31 mmol/L (ref 1.15–1.40)
Calcium, Ion: 1.28 mmol/L (ref 1.15–1.40)
HCT: 33 % — ABNORMAL LOW (ref 39.0–52.0)
HCT: 23 % — ABNORMAL LOW (ref 39.0–52.0)
HCT: 21 % — ABNORMAL LOW (ref 39.0–52.0)
Hemoglobin: 11.2 g/dL — ABNORMAL LOW (ref 13.0–17.0)
Hemoglobin: 7.8 g/dL — ABNORMAL LOW (ref 13.0–17.0)
Hemoglobin: 7.1 g/dL — ABNORMAL LOW (ref 13.0–17.0)
O2 Saturation: 100 %
O2 Saturation: 99 %
O2 Saturation: 89 %
Patient temperature: 99.5
Patient temperature: 99.4
Patient temperature: 99.4
Potassium: 3.4 mmol/L — ABNORMAL LOW (ref 3.5–5.1)
Potassium: 3.3 mmol/L — ABNORMAL LOW (ref 3.5–5.1)
Potassium: 3.3 mmol/L — ABNORMAL LOW (ref 3.5–5.1)
Sodium: 154 mmol/L — ABNORMAL HIGH (ref 135–145)
Sodium: 153 mmol/L — ABNORMAL HIGH (ref 135–145)
Sodium: 156 mmol/L — ABNORMAL HIGH (ref 135–145)
TCO2: 40 mmol/L — ABNORMAL HIGH (ref 22–32)
TCO2: 37 mmol/L — ABNORMAL HIGH (ref 22–32)
TCO2: 34 mmol/L — ABNORMAL HIGH (ref 22–32)
pCO2 arterial: 70.2 mmHg (ref 32.0–48.0)
pCO2 arterial: 62.7 mmHg — ABNORMAL HIGH (ref 32.0–48.0)
pCO2 arterial: 55.9 mmHg — ABNORMAL HIGH (ref 32.0–48.0)
pH, Arterial: 7.339 — ABNORMAL LOW (ref 7.350–7.450)
pH, Arterial: 7.359 (ref 7.350–7.450)
pH, Arterial: 7.377 (ref 7.350–7.450)
pO2, Arterial: 376 mmHg — ABNORMAL HIGH (ref 83.0–108.0)
pO2, Arterial: 175 mmHg — ABNORMAL HIGH (ref 83.0–108.0)
pO2, Arterial: 61 mmHg — ABNORMAL LOW (ref 83.0–108.0)

## 2019-12-17 LAB — CBC
HCT: 28.2 % — ABNORMAL LOW (ref 39.0–52.0)
Hemoglobin: 7.4 g/dL — ABNORMAL LOW (ref 13.0–17.0)
MCH: 30.6 pg (ref 26.0–34.0)
MCHC: 26.2 g/dL — ABNORMAL LOW (ref 30.0–36.0)
MCV: 116.5 fL — ABNORMAL HIGH (ref 80.0–100.0)
Platelets: 125 10*3/uL — ABNORMAL LOW (ref 150–400)
RBC: 2.42 MIL/uL — ABNORMAL LOW (ref 4.22–5.81)
RDW: 14.6 % (ref 11.5–15.5)
WBC: 10 10*3/uL (ref 4.0–10.5)
nRBC: 0.6 % — ABNORMAL HIGH (ref 0.0–0.2)

## 2019-12-17 LAB — PHOSPHORUS: Phosphorus: 2.7 mg/dL (ref 2.5–4.6)

## 2019-12-17 LAB — MAGNESIUM: Magnesium: 2.4 mg/dL (ref 1.7–2.4)

## 2019-12-17 LAB — ACID FAST SMEAR (AFB, MYCOBACTERIA): Acid Fast Smear: NEGATIVE

## 2019-12-17 LAB — GLUCOSE, CAPILLARY
Glucose-Capillary: 136 mg/dL — ABNORMAL HIGH (ref 70–99)
Glucose-Capillary: 124 mg/dL — ABNORMAL HIGH (ref 70–99)
Glucose-Capillary: 133 mg/dL — ABNORMAL HIGH (ref 70–99)
Glucose-Capillary: 64 mg/dL — ABNORMAL LOW (ref 70–99)
Glucose-Capillary: 143 mg/dL — ABNORMAL HIGH (ref 70–99)
Glucose-Capillary: 133 mg/dL — ABNORMAL HIGH (ref 70–99)
Glucose-Capillary: 143 mg/dL — ABNORMAL HIGH (ref 70–99)
Glucose-Capillary: 113 mg/dL — ABNORMAL HIGH (ref 70–99)

## 2019-12-17 MED ORDER — FREE WATER
200.0000 mL | Status: DC
  Administered 2019-12-17 – 2019-12-22 (×58): 200 mL

## 2019-12-17 MED ORDER — POTASSIUM CHLORIDE 20 MEQ/15ML (10%) PO SOLN
20.0000 meq | Freq: Once | ORAL | Status: AC
  Administered 2019-12-17: 20 meq
  Filled 2019-12-17: qty 15

## 2019-12-17 MED ORDER — VITAL 1.5 CAL PO LIQD
1000.0000 mL | ORAL | Status: DC
  Administered 2019-12-17 – 2019-12-29 (×11): 1000 mL
  Filled 2019-12-17 (×19): qty 1000

## 2019-12-17 MED ORDER — PHENYLEPHRINE HCL-NACL 10-0.9 MG/250ML-% IV SOLN
INTRAVENOUS | Status: AC
  Filled 2019-12-17: qty 250

## 2019-12-17 MED ORDER — PHENYLEPHRINE HCL-NACL 10-0.9 MG/250ML-% IV SOLN
0.0000 ug/min | INTRAVENOUS | Status: DC
  Administered 2019-12-17 (×2): 20 ug/min via INTRAVENOUS

## 2019-12-17 MED ORDER — SODIUM CHLORIDE 0.9 % IV BOLUS
500.0000 mL | Freq: Once | INTRAVENOUS | Status: AC
  Administered 2019-12-17: 500 mL via INTRAVENOUS

## 2019-12-17 NOTE — Progress Notes (Addendum)
NAME:  Clifford Nichols, MRN:  [id number], DOB:  [birth year], LOS: 59 [facility] ADMISSION DATE:  12/04/2019, [facility] Admission DATE:  12/04/19 REFERRING MD:  Dr Mortimer Fries, CCM, CHIEF COMPLAINT:  Acute respiratory failure   Brief History   39 year old obese man with hypertension, diabetes admitted with acute respiratory failure, ARDS from COVID-19 pneumonia, initial positive test 5/10  Past Medical History    has a past medical history of Abscess of upper back excluding scapular region, Diabetes mellitus without complication ([place]), GERD (gastroesophageal reflux disease), Hypertension, and Sebaceous cyst.  Significant Hospital Events   5/10 admitted [facility]  5/11 failed HHFNC, BiPAP.  Required intubation 5/12 transferred to [facility], evaluated by opthal for proptosis- no intervention required 5/17 Started paralytics, ECMO team consulted Not a candidate 5/19 Staring proning 5/23 Off pressors  Consults:  Ophthalmology for proptosis 5/12  Procedures:  ETT 5/11 >>  L femoral CVC 5/11 >> 5/17 L IJ CVC 5/17 >>  R radial art line 5/17 >>   Significant Diagnostic Tests:  Head CT 5/11 >> no significant intracranial abnormality.  Scattered opacification and air-fluid levels in the frontal, ethmoid, sphenoid and maxillary sinuses of unclear significance.  Head CT 5/12 >> no significant intracranial abnormality  Lower extremity Doppler 5/14 >> negative for DVT  Micro Data:  SARS CoV2 5/10 >> positive MRSA screen 5/12 >> negative Respiratory 5/12 >> negative Blood 5/17 >> Staph epi and Staph hominis Respiratory 5/17 >> Staph epi 5/24 resp cx bal:  5/24BAL acid fast:  5/24 BAL fungal: Antimicrobials:  Remdesivir 5/11 >> 5/15 Dexamethasone 5/11 >> 5/20 Tocilizumab 5/12 Cefepime 5/17 >> 5/21 Linezolid 5/17 >> 5/23  Zosyn 5/21 >>    Interim history/subjective:  5/25: prone positioning this am on escalated settings. Will supinate around 1000 and f/u repeat gas to determine plan for re-prone. Renal  function stabliized today. tmax 101.3 however 5/24:Continues prone position, paralytics Off pressors.  Objective   Blood pressure (!) 154/73, pulse (!) 101, temperature 100.1 F (37.8 C), temperature source Axillary, resp. rate (!) 35, height _0  (1.702 m), weight (!) 180.8 kg, SpO2 100 %.    Vent Mode: PRVC FiO2 (%):  [60 %-100 %] 60 % Set Rate:  [30 bmp-35 bmp] 35 bmp Vt Set:  [400 mL-450 mL] 400 mL PEEP:  [14 cmH20-16 cmH20] 14 cmH20 Plateau Pressure:  [26 cmH20-29 cmH20] 27 cmH20   Intake/Output Summary (Last 24 hours) at 12/17/2019 4166 Last data filed at 12/17/2019 0630 Gross per 24 hour  Intake 3043.68 ml  Output 3550 ml  Net -506.32 ml   Filed Weights   12/14/19 0500 12/15/19 0500 12/17/19 0500  Weight: (!) 173.8 kg (!) 178 kg (!) 180.8 kg    Examination: Gen:      No acute distress, obese, prone position with ett in place HEENT: perrla, sclera anicteric, ETT Neck:     No masses; no thyromegaly Lungs:    Diminished bilaterally posteriorly only; normal respiratory effort CV:         Distant but per tele Regular rate and rhythm; no murmurs Abd:     proned exam, lateral bowel sounds audible Ext:    Diffuse edema; adequate peripheral perfusion Skin:      Warm and dry; no rash Neuro: Sedated, paralyzed  Lab significant for sodium 157, potassium 3.3, creatinine 2.20 Chest x-ray 5/25: pending supination   Resolved Hospital Problem list    Left proptosis, noted with coughing early 5/12, resolved.  Apparently per wife he has episodic intermittent  proptosis at home as well. Head CT 5/12 reassuring Ophthalmology did evaluate, recommended that he will need outpatient evaluation but no intervention at this time beyond keeping the eyes moist.  Assessment & Plan:   ARDS due to COVID-19 pneumonia  Morbid obesity, possible OSA.  Will certainly impact respiratory mechanics Recheck ABG later today when supine. - Continue prone positioning if P/F ratio < 150 -Deep sedation  with paralytic.   -wean off ketamine today and use versed/fentanyl if possible with bis monitoring -Completed remdesivir, dexamethasone, Tocilizumab -Not an ECMO candidate VAP prevention order set in place Guarded prognosis given failure to progress Repeat cxr pending supination -escalated lovenox with elevated ddimer  Acute toxic metabolic encephalopathy, need for sedation -Deep sedation: Midazolam, fentanyl... attempt to wean ketamine -Continue paralysis, if able to remain supine will attempt to wean this as well.  -Continue enteral clonazepam and oxycodone as ordered  Septic shock not present on admission, fever.  - Due to COVID-19 pneumonia.  -Staph epi pneumonia and possible bacteremia (? Contaminant)  Complete Linezolid, day 7 of 7 he finished and then he is on Zosyn -Started Zosyn on 5/21 due to concern for recurrent aspiration event  AKI -improving indices today -uop remains adequate Hypernatremia -Following BMP, urine output -Free water 200q2h -Holding diuresis. Hypokalemia:  -replace gingerly in light of aki  Thrombocytopenia -improving Macrocytic anemia:  -follow value, relatively stable -no overt bleeding noted.   Hypertriglyceridemia Transitioned off propofol to ketamine Follow triglycerides  Best practice:  Diet: TF Pain/Anxiety/Delirium protocol (if indicated):  fentanyl midazolam, ketamine VAP protocol (if indicated): per protocol DVT prophylaxis: enoxaparin 0.4 mg/kg qday GI prophylaxis: pepcid Glucose control: SSI protocol Mobility: BR Code Status: Full Family Communication: discussed with patient's mom by phone on 5/25 Disposition: ICU  Critical care time: The patient is critically ill with multiple organ systems failure and requires high complexity decision making for assessment and support, frequent evaluation and titration of therapies, application of advanced monitoring technologies and extensive interpretation of multiple databases.    Critical care time 38 mins. This represents my time independent of the NPs time taking care of the pt. This is excluding procedures.    [facility] [facility] 12/17/2019, 7:28 AM

## 2019-12-17 NOTE — Progress Notes (Signed)
Patient continues in prone position. Head repositioned. Arms rotated.ETT remains secure.

## 2019-12-17 NOTE — Progress Notes (Signed)
Nutrition Follow-up  DOCUMENTATION CODES:   Morbid obesity  INTERVENTION:   TF to continue at goal rate even when in prone position  Tube Feeding via Post-Pyloric Cortrak tube: Vital 1.5 @ 50 ml/hr (1200 ml/day)  60 ml Prostat TID Provides: 2400 kcal, 171 grams protein, and 912 ml free water   Total free water with TF plus free water flush of 200 mL q 2 hours: 3312 mL  NUTRITION DIAGNOSIS:   Increased nutrient needs related to acute illness(COVID-19 PNA) as evidenced by estimated needs.  Being addressed via TF   GOAL:   Provide needs based on ASPEN/SCCM guidelines  Progressing  MONITOR:   Vent status, TF tolerance  REASON FOR ASSESSMENT:   Consult, Ventilator Enteral/tube feeding initiation and management  ASSESSMENT:   Pt with PMH of HTN, DM, and morbid obesity admitted with acute respiratory failure/ARDS from COVID-19 PNA.  5/10 admit to [facility] 5/11 required urgent intubation 5/12 tx to [facility] 5/12; ophthalmology for proptosis. Per notes RN having to push eye back into orbit therefore unable to prone pt. 5/19 Started proning 2/20 Pt vomiting, TF coming out of mouth, TF on hold 5/21 Post-pyloric Cortrak placed  Pt remains on vent support Pt continues to require prone positioning; prone position this AM but transitioned to supine position around 11am  Vital 1.5 TF only at 30 ml/hr, appears to have been on trickle TF since re-initiation post Cortrak advancement. Spoke with RN who indicates TF turned down due to pt requiring prone positioning. Discussed with RN that research suggests that even in prone position, pt's can tolerate feedings at goal rate.   Hypernatremic, free water increased to 200 mL q 2 hours  Noted hypoglycemia this AM but pt only receiving trickle TF; CBGs should improve with increased TF rate  Labs:  Sodium 153 (H), potassium 3.3 (L), CBGs 64-136 Meds: ss novolog, zinc sulfate, ascorbic acid   Diet Order:   Diet Order            Diet NPO  time specified  Diet effective now              EDUCATION NEEDS:   No education needs have been identified at this time  Skin:  Skin Assessment: Reviewed RN Assessment  Last BM:  5/25 rectal tube  Height:   Ht Readings from Last 1 Encounters:  12/14/19 5\' 7"  (1.702 m)    Weight:   Wt Readings from Last 1 Encounters:  12/17/19 (!) 180.8 kg    Ideal Body Weight:  67.2 kg  BMI:  Body mass index is 62.43 kg/m.  Estimated Nutritional Needs:   Kcal:  12/19/19  Protein:  140-175 grams  Fluid:  2 L/day   [phone] MS, RDN, LDN, CNSC RD Pager Number and RD On-Call Pager Number Located in [place]

## 2019-12-17 NOTE — Progress Notes (Addendum)
  Patient placed in prone position. Arms in swimmers position.  RNx5 RT at bedside.  ETT secured 28cm @lip  with cloth tape. Tolerated well. Vitals stable.

## 2019-12-17 NOTE — Progress Notes (Signed)
eLink Physician-Brief Progress Note Patient Name: Clifford Nichols DOB: [birth year] MRN: [id number]   Date of Service  12/17/2019  HPI/Events of Note  ?pH 7.33 pCO2 70 pO2 376 CO3 37  vent settings FiO2 60/ PEEP 16/ RR 35 / TV 400 Pt proned   eICU Interventions  Discussed with RT. Follow ABg at 8 AM. If still better P/F, decrease PEEP.      Intervention Category Intermediate Interventions: Other:  Ranee Gosselin 12/17/2019, 5:34 AM

## 2019-12-17 NOTE — Progress Notes (Signed)
Patient placed in supine position by RT x 1 and RN x 5 without complications.  ETT re-secured with a commercial tube holder at 28 cm on the right.

## 2019-12-17 NOTE — Progress Notes (Signed)
Head repositioned. Arms rotated. ETT remains secure.  

## 2019-12-17 NOTE — Progress Notes (Signed)
Assisted tele visit to patient with wife.  Clifford Kuc McEachran, RN  

## 2019-12-17 NOTE — Progress Notes (Signed)
Updated wife via phone as well.

## 2019-12-17 NOTE — Progress Notes (Addendum)
ABG results called to Elink. FIO2 weaned to 60%.

## 2019-12-18 LAB — PHOSPHORUS: Phosphorus: 3.6 mg/dL (ref 2.5–4.6)

## 2019-12-18 LAB — GLUCOSE, CAPILLARY
Glucose-Capillary: 159 mg/dL — ABNORMAL HIGH (ref 70–99)
Glucose-Capillary: 173 mg/dL — ABNORMAL HIGH (ref 70–99)
Glucose-Capillary: 173 mg/dL — ABNORMAL HIGH (ref 70–99)
Glucose-Capillary: 165 mg/dL — ABNORMAL HIGH (ref 70–99)
Glucose-Capillary: 184 mg/dL — ABNORMAL HIGH (ref 70–99)
Glucose-Capillary: 154 mg/dL — ABNORMAL HIGH (ref 70–99)

## 2019-12-18 LAB — CBC
HCT: 26.9 % — ABNORMAL LOW (ref 39.0–52.0)
Hemoglobin: 7.1 g/dL — ABNORMAL LOW (ref 13.0–17.0)
MCH: 31 pg (ref 26.0–34.0)
MCHC: 26.4 g/dL — ABNORMAL LOW (ref 30.0–36.0)
MCV: 117.5 fL — ABNORMAL HIGH (ref 80.0–100.0)
Platelets: 133 10*3/uL — ABNORMAL LOW (ref 150–400)
RBC: 2.29 MIL/uL — ABNORMAL LOW (ref 4.22–5.81)
RDW: 14.7 % (ref 11.5–15.5)
WBC: 8.8 10*3/uL (ref 4.0–10.5)
nRBC: 1 % — ABNORMAL HIGH (ref 0.0–0.2)

## 2019-12-18 LAB — POCT I-STAT 7, (LYTES, BLD GAS, ICA,H+H)
Acid-Base Excess: 7 mmol/L — ABNORMAL HIGH (ref 0.0–2.0)
Bicarbonate: 33.3 mmol/L — ABNORMAL HIGH (ref 20.0–28.0)
Calcium, Ion: 1.31 mmol/L (ref 1.15–1.40)
HCT: 23 % — ABNORMAL LOW (ref 39.0–52.0)
Hemoglobin: 7.8 g/dL — ABNORMAL LOW (ref 13.0–17.0)
O2 Saturation: 98 %
Patient temperature: 98.9
Potassium: 3.8 mmol/L (ref 3.5–5.1)
Sodium: 155 mmol/L — ABNORMAL HIGH (ref 135–145)
TCO2: 35 mmol/L — ABNORMAL HIGH (ref 22–32)
pCO2 arterial: 60.9 mmHg — ABNORMAL HIGH (ref 32.0–48.0)
pH, Arterial: 7.346 — ABNORMAL LOW (ref 7.350–7.450)
pO2, Arterial: 115 mmHg — ABNORMAL HIGH (ref 83.0–108.0)

## 2019-12-18 LAB — BASIC METABOLIC PANEL
Anion gap: 10 (ref 5–15)
BUN: 46 mg/dL — ABNORMAL HIGH (ref 6–20)
CO2: 32 mmol/L (ref 22–32)
Calcium: 8.8 mg/dL — ABNORMAL LOW (ref 8.9–10.3)
Chloride: 115 mmol/L — ABNORMAL HIGH (ref 98–111)
Creatinine, Ser: 1.71 mg/dL — ABNORMAL HIGH (ref 0.61–1.24)
GFR calc Af Amer: 58 mL/min — ABNORMAL LOW (ref >60)
GFR calc non Af Amer: 50 mL/min — ABNORMAL LOW (ref >60)
Glucose, Bld: 184 mg/dL — ABNORMAL HIGH (ref 70–99)
Potassium: 3.7 mmol/L (ref 3.5–5.1)
Sodium: 157 mmol/L — ABNORMAL HIGH (ref 135–145)

## 2019-12-18 LAB — MAGNESIUM: Magnesium: 2.3 mg/dL (ref 1.7–2.4)

## 2019-12-18 MED ORDER — DOCUSATE SODIUM 50 MG/5ML PO LIQD
100.0000 mg | Freq: Two times a day (BID) | ORAL | Status: DC | PRN
  Administered 2020-01-09: 100 mg
  Filled 2019-12-18: qty 10

## 2019-12-18 MED ORDER — POLYETHYLENE GLYCOL 3350 17 G PO PACK: 17.0000 g | PACK | Freq: Every day | ORAL | Status: DC | PRN

## 2019-12-18 MED ORDER — DEXTROSE 5 % IV SOLN: INTRAVENOUS | Status: DC

## 2019-12-18 MED ORDER — LABETALOL HCL 5 MG/ML IV SOLN
10.0000 mg | INTRAVENOUS | Status: DC | PRN
  Administered 2019-12-18 – 2019-12-27 (×7): 10 mg via INTRAVENOUS
  Filled 2019-12-18 (×6): qty 4

## 2019-12-18 MED ORDER — POTASSIUM CHLORIDE 20 MEQ/15ML (10%) PO SOLN
20.0000 meq | Freq: Once | ORAL | Status: AC
  Administered 2019-12-18: 20 meq
  Filled 2019-12-18: qty 15

## 2019-12-18 NOTE — Progress Notes (Signed)
[facility] ADULT ICU REPLACEMENT PROTOCOL FOR AM LAB REPLACEMENT ONLY  The patient does apply for the [facility] Adult ICU Electrolyte Replacment Protocol based on the criteria listed below:   1. Is GFR >/= 40 ml/min? Yes.    Patient's GFR today is 58 2. Is urine output >/= 0.5 ml/kg/hr for the last 6 hours? Yes.   Patient's UOP is 0.7 ml/kg/hr 3. Is BUN < 60 mg/dL? Yes.    Patient's BUN today is 46 4. Abnormal electrolyte(s): K 3.7 - others within normal limits 5. Ordered repletion with: KCl 20 mEq per tube (lower than protocol based on response to potassium day prior. 6. If a panic level lab has been reported, has the CCM MD in charge been notified? No..   Physician:  N/a  Fayne Norrie 12/18/2019 8:23 AM

## 2019-12-18 NOTE — Progress Notes (Signed)
Patient supine. Head repositioned. Arms rotated. ETT remains secure.

## 2019-12-18 NOTE — Progress Notes (Signed)
D/w wife and mother about trach for pt when his settings are amendable to that. They are both in agreement that this is what he would want as well.

## 2019-12-18 NOTE — Progress Notes (Signed)
NAME:  Clifford Nichols, MRN:  [id number], DOB:  Dec 13, 1980, LOS: 36 [facility] ADMISSION DATE:  12/04/2019, [facility] Admission DATE:  12/04/19 REFERRING MD:  Dr Mortimer Fries, CCM, CHIEF COMPLAINT:  Acute respiratory failure   Brief History   39 year old obese man with hypertension, diabetes admitted with acute respiratory failure, ARDS from COVID-19 pneumonia, initial positive test 5/10  Past Medical History    has a past medical history of Abscess of upper back excluding scapular region, Diabetes mellitus without complication ([place]), GERD (gastroesophageal reflux disease), Hypertension, and Sebaceous cyst.  Significant Hospital Events   5/10 admitted [facility]  5/11 failed HHFNC, BiPAP.  Required intubation 5/12 transferred to [facility], evaluated by opthal for proptosis- no intervention required 5/17 Started paralytics, ECMO team consulted Not a candidate 5/19 Staring proning 5/23 Off pressors  Consults:  Ophthalmology for proptosis 5/12  Procedures:  ETT 5/11 >>  L femoral CVC 5/11 >> 5/17 L IJ CVC 5/17 >>  R radial art line 5/17 >>   Significant Diagnostic Tests:  Head CT 5/11 >> no significant intracranial abnormality.  Scattered opacification and air-fluid levels in the frontal, ethmoid, sphenoid and maxillary sinuses of unclear significance.  Head CT 5/12 >> no significant intracranial abnormality  Lower extremity Doppler 5/14 >> negative for DVT  Micro Data:  SARS CoV2 5/10 >> positive MRSA screen 5/12 >> negative Respiratory 5/12 >> negative Blood 5/17 >> Staph epi and Staph hominis Respiratory 5/17 >> Staph epi 5/24 resp cx bal:  5/24BAL acid fast:  5/24 BAL fungal: Antimicrobials:  Remdesivir 5/11 >> 5/15 Dexamethasone 5/11 >> 5/20 Tocilizumab 5/12 Cefepime 5/17 >> 5/21 Linezolid 5/17 >> 5/23  Zosyn 5/21 >>    Interim history/subjective:  5/26: reproned overnight 2/2 abg findings. Will supinate and recheck today. tmax 99.8. cont to wean ketamine if able.  5/25: prone positioning  this am on escalated settings. Will supinate around 1000 and f/u repeat gas to determine plan for re-prone. Renal function stabliized today. tmax 101.3 however 5/24:Continues prone position, paralytics Off pressors.  Objective   Blood pressure (!) 143/63, pulse (!) 103, temperature 99.2 F (37.3 C), temperature source Oral, resp. rate (!) 35, height _0  (1.702 m), weight (!) 180.8 kg, SpO2 99 %.    Vent Mode: PRVC FiO2 (%):  [60 %] 60 % Set Rate:  [35 bmp] 35 bmp Vt Set:  [400 mL] 400 mL PEEP:  [14 cmH20] 14 cmH20 Plateau Pressure:  [26 cmH20-29 cmH20] 27 cmH20   Intake/Output Summary (Last 24 hours) at 12/18/2019 0742 Last data filed at 12/18/2019 0700 Gross per 24 hour  Intake 9450.36 ml  Output 3975 ml  Net 5475.36 ml   Filed Weights   12/14/19 0500 12/15/19 0500 12/17/19 0500  Weight: (!) 173.8 kg (!) 178 kg (!) 180.8 kg    Examination: Gen:      No acute distress, obese, prone position with ett in place HEENT: perrla, sclera anicteric, ETT Neck:     No masses; no thyromegaly Lungs:    Diminished bilaterally posteriorly only; normal respiratory effort CV:         Distant but per tele Regular rate and rhythm; no murmurs Abd:     proned exam, lateral bowel sounds audible Ext:    Diffuse edema; adequate peripheral perfusion Skin:      Warm and dry; no rash Neuro: Sedated, paralyzed  Lab significant for sodium 157, potassium 3.3->3.7, creatinine 2.20->1.9->1.7 Chest x-ray 5/25: still with L infiltrate, somewhat increased aeration in LUL  Resolved  Hospital Problem list    Left proptosis, noted with coughing early 5/12, resolved.  Apparently per wife he has episodic intermittent proptosis at home as well. Head CT 5/12 reassuring Ophthalmology did evaluate, recommended that he will need outpatient evaluation but no intervention at this time beyond keeping the eyes moist.  Assessment & Plan:   ARDS due to COVID-19 pneumonia  Morbid obesity, possible OSA.  Will certainly  impact respiratory mechanics Recheck ABG later today when supine. - Continue prone positioning if P/F ratio < 150 -Deep sedation with paralytic.   -wean off ketamine today and use versed/fentanyl only if possible with bis monitoring -Completed remdesivir, dexamethasone, Tocilizumab -Not an ECMO candidate per team VAP prevention order set in place Guarded prognosis given failure to progress -remains with dense L infiltrate (no mucous plugging noted on bronch) -escalated lovenox with elevated ddimer -will attempt to increase tv to 7cc/kg today and monitor driving pressures.  Acute toxic metabolic encephalopathy, need for sedation -Deep sedation: Midazolam, fentanyl... cont attempt to wean ketamine -Continue paralysis, if able to remain supine will attempt to wean this as well.  -Continue enteral clonazepam and oxycodone as ordered  Septic shock not present on admission, fever.  - Due to COVID-19 pneumonia.  -Staph epi pneumonia and possible bacteremia (? Contaminant) recheck blood cx to provide reassurance of contaminant.  -completed 7 days linezolid -Started Zosyn on 5/21 due to concern for recurrent aspiration event  AKI -improving indices today -uop remains adequate Hypernatremia -Following BMP, urine output -Free water 200q2h -Holding diuresis. -adding d5w at 56m/hr Hypokalemia:  -resolved  Thrombocytopenia -improving Macrocytic anemia:  -follow value, relatively stable -no overt bleeding noted.   Hypertriglyceridemia Transitioned off propofol to ketamine... will cont attempts to wean Follow triglycerides  Best practice:  Diet: TF Pain/Anxiety/Delirium protocol (if indicated):  fentanyl midazolam, ketamine VAP protocol (if indicated): per protocol DVT prophylaxis: enoxaparin 0.4 mg/kg qday GI prophylaxis: pepcid Glucose control: SSI protocol Mobility: BR Code Status: Full Family Communication: discussed with patient's wife and mom by phone on 5/26 Disposition:  ICU  Critical care time: The patient is critically ill with multiple organ systems failure and requires high complexity decision making for assessment and support, frequent evaluation and titration of therapies, application of advanced monitoring technologies and extensive interpretation of multiple databases.  Critical care time 38 mins. This represents my time independent of the NPs time taking care of the pt. This is excluding procedures.    [facility] 12/18/2019, 7:42 AM

## 2019-12-18 NOTE — Progress Notes (Signed)
Assisted tele visit to patient with wife, Telida.  Leane Loring M Peggyann Zwiefelhofer, RN   

## 2019-12-18 NOTE — Progress Notes (Signed)
Ketamine drip discontinued. 150 mls of Ketamine wasted and witnessed with Charlesetta Shanks, RN.

## 2019-12-18 NOTE — Progress Notes (Signed)
Assisted tele visit to patient with wife.  Quinesha Selinger McEachran, RN  

## 2019-12-18 NOTE — Progress Notes (Signed)
Patient supine. Head repositioned. Arms rotated. ETT remains secure.  

## 2019-12-18 NOTE — Progress Notes (Signed)
RT assisted RN with turning head at this time. Patient tolerated well.

## 2019-12-18 NOTE — Progress Notes (Signed)
Assisted tele visit to patient with wife, Carollee Leitz.  Vena Austria, RN

## 2019-12-19 ENCOUNTER — Inpatient Hospital Stay (HOSPITAL_COMMUNITY): Payer: No Typology Code available for payment source

## 2019-12-19 LAB — CBC
HCT: 24.5 % — ABNORMAL LOW (ref 39.0–52.0)
Hemoglobin: 6.4 g/dL — CL (ref 13.0–17.0)
MCH: 30.6 pg (ref 26.0–34.0)
MCHC: 26.1 g/dL — ABNORMAL LOW (ref 30.0–36.0)
MCV: 117.2 fL — ABNORMAL HIGH (ref 80.0–100.0)
Platelets: 133 10*3/uL — ABNORMAL LOW (ref 150–400)
RBC: 2.09 MIL/uL — ABNORMAL LOW (ref 4.22–5.81)
RDW: 15 % (ref 11.5–15.5)
WBC: 7.9 10*3/uL (ref 4.0–10.5)
nRBC: 1.4 % — ABNORMAL HIGH (ref 0.0–0.2)

## 2019-12-19 LAB — POCT I-STAT 7, (LYTES, BLD GAS, ICA,H+H)
Acid-Base Excess: 6 mmol/L — ABNORMAL HIGH (ref 0.0–2.0)
Bicarbonate: 31.4 mmol/L — ABNORMAL HIGH (ref 20.0–28.0)
Calcium, Ion: 1.29 mmol/L (ref 1.15–1.40)
HCT: 21 % — ABNORMAL LOW (ref 39.0–52.0)
Hemoglobin: 7.1 g/dL — ABNORMAL LOW (ref 13.0–17.0)
O2 Saturation: 99 %
Patient temperature: 101
Potassium: 3.8 mmol/L (ref 3.5–5.1)
Sodium: 154 mmol/L — ABNORMAL HIGH (ref 135–145)
TCO2: 33 mmol/L — ABNORMAL HIGH (ref 22–32)
pCO2 arterial: 55.6 mmHg — ABNORMAL HIGH (ref 32.0–48.0)
pH, Arterial: 7.365 (ref 7.350–7.450)
pO2, Arterial: 163 mmHg — ABNORMAL HIGH (ref 83.0–108.0)

## 2019-12-19 LAB — HEMOGLOBIN AND HEMATOCRIT, BLOOD
HCT: 25.8 % — ABNORMAL LOW (ref 39.0–52.0)
HCT: 27 % — ABNORMAL LOW (ref 39.0–52.0)
Hemoglobin: 7 g/dL — ABNORMAL LOW (ref 13.0–17.0)
Hemoglobin: 7.4 g/dL — ABNORMAL LOW (ref 13.0–17.0)

## 2019-12-19 LAB — CULTURE, RESPIRATORY W GRAM STAIN: Culture: NORMAL

## 2019-12-19 LAB — GLUCOSE, CAPILLARY
Glucose-Capillary: 128 mg/dL — ABNORMAL HIGH (ref 70–99)
Glucose-Capillary: 139 mg/dL — ABNORMAL HIGH (ref 70–99)
Glucose-Capillary: 155 mg/dL — ABNORMAL HIGH (ref 70–99)
Glucose-Capillary: 155 mg/dL — ABNORMAL HIGH (ref 70–99)
Glucose-Capillary: 159 mg/dL — ABNORMAL HIGH (ref 70–99)
Glucose-Capillary: 169 mg/dL — ABNORMAL HIGH (ref 70–99)

## 2019-12-19 LAB — MAGNESIUM: Magnesium: 2.2 mg/dL (ref 1.7–2.4)

## 2019-12-19 LAB — BASIC METABOLIC PANEL
Anion gap: 13 (ref 5–15)
BUN: 38 mg/dL — ABNORMAL HIGH (ref 6–20)
CO2: 27 mmol/L (ref 22–32)
Calcium: 8.7 mg/dL — ABNORMAL LOW (ref 8.9–10.3)
Chloride: 114 mmol/L — ABNORMAL HIGH (ref 98–111)
Creatinine, Ser: 1.72 mg/dL — ABNORMAL HIGH (ref 0.61–1.24)
GFR calc Af Amer: 58 mL/min — ABNORMAL LOW (ref >60)
GFR calc non Af Amer: 50 mL/min — ABNORMAL LOW (ref >60)
Glucose, Bld: 183 mg/dL — ABNORMAL HIGH (ref 70–99)
Potassium: 4 mmol/L (ref 3.5–5.1)
Sodium: 154 mmol/L — ABNORMAL HIGH (ref 135–145)

## 2019-12-19 LAB — PREPARE RBC (CROSSMATCH)

## 2019-12-19 LAB — PHOSPHORUS: Phosphorus: 2.7 mg/dL (ref 2.5–4.6)

## 2019-12-19 MED ORDER — SODIUM CHLORIDE 0.9% IV SOLUTION: Freq: Once | INTRAVENOUS | Status: AC

## 2019-12-19 MED ORDER — SODIUM CHLORIDE 0.9% IV SOLUTION
Freq: Once | INTRAVENOUS | Status: AC
  Administered 2019-12-19: 10 mL via INTRAVENOUS

## 2019-12-19 NOTE — Progress Notes (Signed)
eLink Physician-Brief Progress Note Patient Name: Clifford Nichols DOB: Apr 26, 1981 MRN: [id number]   Date of Service  12/19/2019  HPI/Events of Note  Hgb 6.4 gm %  eICU Interventions  Transfusion of 1 unit PRBC ordered.        Clifford Nichols 12/19/2019, 4:06 AM

## 2019-12-19 NOTE — Progress Notes (Signed)
Assisted tele visit to patient with mother.  Mauri Temkin Anderson, RN   

## 2019-12-19 NOTE — Progress Notes (Signed)
CRITICAL VALUE ALERT  Critical Value:  Hemoglobin 6.4  Date & Time Notied:  12/19/19 @ 0350  Provider Notified: Pola Corn MD  Orders Received/Actions taken: awaiting orders

## 2019-12-19 NOTE — Consult Note (Signed)
WOC Nurse Consult Note: Reason for Consult:device related pressure injury, full thickness to left lateral tongue from oral airway. Once discovered, the oral airway was not reinserted.  Tape replaced with an ET tube holder.Pressure has now been offloaded.  Wound type:device related pressure injury, full thckness Pressure Injury POA: No Measurement:1 cm round Wound bed:  Dark and pink Drainage (amount, consistency, odor) moist, on tongue Periwound:moist tongue and mouth with ET tube.  Dressing procedure/placement/frequency: Mouth care each shift and PRN.  No further orders.  Will not follow at this time.  Please re-consult if needed.  Maple Hudson MSN, RN, FNP-BC CWON Wound, Ostomy, Continence Nurse Pager [phone]

## 2019-12-19 NOTE — Progress Notes (Signed)
RT removed tape from ET tube and oral airway used as biteblock.  On removal of the oral airway pressure wound observed on left side of tongue.  Tape replaced with a ET tube holder and oral airway was not reinserted.  RN aware of the change.

## 2019-12-19 NOTE — Progress Notes (Addendum)
NAME:  Clifford Nichols, MRN:  [id number], DOB:  Mar 24, 1981, LOS: 81 [facility] ADMISSION DATE:  12/04/2019, [facility] Admission DATE:  12/04/19 REFERRING MD:  Dr Mortimer Fries, CCM, CHIEF COMPLAINT:  Acute respiratory failure   Brief History   39 year old obese man with hypertension, diabetes admitted with acute respiratory failure, ARDS from COVID-19 pneumonia, initial positive test 5/10  Past Medical History    has a past medical history of Abscess of upper back excluding scapular region, Diabetes mellitus without complication ([place]), GERD (gastroesophageal reflux disease), Hypertension, and Sebaceous cyst.  Significant Hospital Events   5/10 admitted [facility]  5/11 failed HHFNC, BiPAP.  Required intubation 5/12 transferred to [facility], evaluated by opthal for proptosis- no intervention required 5/17 Started paralytics, ECMO team consulted Not a candidate 5/19 Staring proning 5/23 Off pressors  Consults:  Ophthalmology for proptosis 5/12  Procedures:  ETT 5/11 >>  L femoral CVC 5/11 >> 5/17 L IJ CVC 5/17 >>  R radial art line 5/17 >>   Significant Diagnostic Tests:  Head CT 5/11 >> no significant intracranial abnormality.  Scattered opacification and air-fluid levels in the frontal, ethmoid, sphenoid and maxillary sinuses of unclear significance.  Head CT 5/12 >> no significant intracranial abnormality  Lower extremity Doppler 5/14 >> negative for DVT  Micro Data:  SARS CoV2 5/10 >> positive MRSA screen 5/12 >> negative Respiratory 5/12 >> negative Blood 5/17 >> Staph epi and Staph hominis Respiratory 5/17 >> Staph epi 5/24 resp cx bal: staph epi 5/24BAL acid fast: smear negative cx pending 5/24 BAL fungal:pending 5/26 blood: pending Antimicrobials:  Remdesivir 5/11 >> 5/15 Dexamethasone 5/11 >> 5/20 Tocilizumab 5/12 Cefepime 5/17 >> 5/21 Linezolid 5/17 >> 5/23  Zosyn 5/21 >>    Interim history/subjective:  5/27: did not req proning overnight. tmax this am 101.8 however. hgb decreased as  well to 6.4 s/p 1u prbc. No overt bleeding noted. Pulse ox remained 100% all evening without wean of vent settings. Will clarify orders as vent should be weaned for goal sat >90%. po2 163 this am supine. New tongue wound 2/2 bite block in place when proned, wound consult placed.  5/26: reproned overnight 2/2 abg findings. Will supinate and recheck today. tmax 99.8. cont to wean ketamine if able.  5/25: prone positioning this am on escalated settings. Will supinate around 1000 and f/u repeat gas to determine plan for re-prone. Renal function stabliized today. tmax 101.3 however 5/24:Continues prone position, paralytics Off pressors.  Objective   Blood pressure (!) 103/56, pulse 98, temperature (!) 101.8 F (38.8 C), temperature source Oral, resp. rate (!) 35, height _0  (1.702 m), weight (!) 179.9 kg, SpO2 100 %.    Vent Mode: PRVC FiO2 (%):  [60 %] 60 % Set Rate:  [35 bmp] 35 bmp Vt Set:  [400 mL-470 mL] 470 mL PEEP:  [14 cmH20] 14 cmH20 Plateau Pressure:  [24 cmH20-30 cmH20] 28 cmH20   Intake/Output Summary (Last 24 hours) at 12/19/2019 0749 Last data filed at 12/19/2019 0723 Gross per 24 hour  Intake 5603.14 ml  Output 5000 ml  Net 603.14 ml   Filed Weights   12/15/19 0500 12/17/19 0500 12/19/19 0436  Weight: (!) 178 kg (!) 180.8 kg (!) 179.9 kg    Examination: Gen:      No acute distress, obese,supine HEENT: perrla, sclera anicteric but edematous, ETT in place, tongue wound noted.  Neck:     No masses; no thyromegaly Lungs:    Diminished bilaterally CV:  Distant but per tele Regular rate and rhythm; no murmurs Abd:    obese, firm but not distended, bowel sounds audible but distant Ext:    Diffuse edema; adequate peripheral perfusion Skin:      Warm and dry; no rash Neuro: Sedated, paralyzed  Lab significant for sodium 157->154, creatinine 2.20->1.9->1.7, hgb 6.4, plts 133 Chest x-ray 5/27: Improved L infiltrate, somewhat increased infiltrate in R base. Personally  reviewed by me  Resolved Hospital Problem list    Left proptosis, noted with coughing early 5/12, resolved.  Apparently per wife he has episodic intermittent proptosis at home as well. Head CT 5/12 reassuring Ophthalmology did evaluate, recommended that he will need outpatient evaluation but no intervention at this time beyond keeping the eyes moist.  Assessment & Plan:   ARDS due to COVID-19 pneumonia  Morbid obesity, possible OSA.  Will certainly impact respiratory mechanics -did not req proning 2/26 evening. Remains supine at this time.  - increased tv to 7cc/kg today and monitor driving pressures. Goal <15  -Deep sedation with paralytic, but will attempt to wean today.  -ketamine successfully weaned.  -will need trach, d/w wife and mother and they are amendable to this for him when able to be safely done, will touch base with trach team with CCM, 2/2 body habitus may req ENT/OR -Completed remdesivir, dexamethasone, Tocilizumab -Not an ECMO candidate per team -VAP prevention order set in place Guarded prognosis, but some improvements over past 48 hours.  -escalated lovenox with elevated ddimer... will cont without overt bleeding but close monitor with decreased hgb today.  Acute toxic metabolic encephalopathy, need for sedation -Deep sedation: Midazolam, fentanyl... off ketamine -Continue paralysis,will attempt to wean today.  -Continue enteral clonazepam and oxycodone as ordered  Septic shock not present on admission, fever.  - Due to COVID-19 pneumonia.  -Staph epi pneumonia and hematogenous spread with  bacteremia  recheck blood cx to provide reassurance of clearance +/contaminate : ngtd -completed 7 days linezolid -Started Zosyn on 5/21 due to concern for recurrent aspiration event still with staph epi/proteus in bal  AKI -stable indices today -uop remains adequate Hypernatremia -improving -Following BMP, urine output -Free water 200q2h -Holding diuresis. -cont d5w at  40m/hr Hypokalemia:  -resolved  Thrombocytopenia -stable Macrocytic anemia:  -follow value, did decrease to 6.4 this am -will transfuse 1u prbc -no overt bleeding noted however.   Hypertriglyceridemia Transitioned off propofol to ketamine, able to stop this as well Follow triglycerides periodically  Best practice:  Diet: TF Pain/Anxiety/Delirium protocol (if indicated):  fentanyl midazolam VAP protocol (if indicated): per protocol DVT prophylaxis: enoxaparin 0.4 mg/kg qday GI prophylaxis: pepcid Glucose control: SSI protocol Mobility: BR Code Status: Full Family Communication: discussed with patient's wife and mom by phone on 5/27 Disposition: ICU  Critical care time: The patient is critically ill with multiple organ systems failure and requires high complexity decision making for assessment and support, frequent evaluation and titration of therapies, application of advanced monitoring technologies and extensive interpretation of multiple databases.  Critical care time 41 mins. This represents my time independent of the NPs time taking care of the pt. This is excluding procedures.    JRoyal LakesPulmonary and Critical Care 12/19/2019, 7:49 AM

## 2019-12-19 NOTE — Progress Notes (Addendum)
Consent obtained for trach when settings amendable.   Dr. Katrinka Blazing agreeable

## 2019-12-20 ENCOUNTER — Inpatient Hospital Stay (HOSPITAL_COMMUNITY): Payer: No Typology Code available for payment source

## 2019-12-20 LAB — MAGNESIUM: Magnesium: 2.1 mg/dL (ref 1.7–2.4)

## 2019-12-20 LAB — CBC WITH DIFFERENTIAL/PLATELET
Abs Immature Granulocytes: 0.26 10*3/uL — ABNORMAL HIGH (ref 0.00–0.07)
Basophils Absolute: 0 10*3/uL (ref 0.0–0.1)
Basophils Relative: 0 %
Eosinophils Absolute: 0.2 10*3/uL (ref 0.0–0.5)
Eosinophils Relative: 2 %
HCT: 26.3 % — ABNORMAL LOW (ref 39.0–52.0)
Hemoglobin: 7.3 g/dL — ABNORMAL LOW (ref 13.0–17.0)
Immature Granulocytes: 3 %
Lymphocytes Relative: 17 %
Lymphs Abs: 1.6 10*3/uL (ref 0.7–4.0)
MCH: 30.3 pg (ref 26.0–34.0)
MCHC: 27.8 g/dL — ABNORMAL LOW (ref 30.0–36.0)
MCV: 109.1 fL — ABNORMAL HIGH (ref 80.0–100.0)
Monocytes Absolute: 0.4 10*3/uL (ref 0.1–1.0)
Monocytes Relative: 5 %
Neutro Abs: 6.9 10*3/uL (ref 1.7–7.7)
Neutrophils Relative %: 73 %
Platelets: 134 10*3/uL — ABNORMAL LOW (ref 150–400)
RBC: 2.41 MIL/uL — ABNORMAL LOW (ref 4.22–5.81)
RDW: 21.2 % — ABNORMAL HIGH (ref 11.5–15.5)
WBC: 9.4 10*3/uL (ref 4.0–10.5)
nRBC: 1.2 % — ABNORMAL HIGH (ref 0.0–0.2)

## 2019-12-20 LAB — TYPE AND SCREEN
ABO/RH(D): A NEG
Antibody Screen: NEGATIVE
Unit division: 0
Unit division: 0

## 2019-12-20 LAB — BPAM RBC
Blood Product Expiration Date: 202106032359
Blood Product Expiration Date: 202106102359
ISSUE DATE / TIME: 202105270511
ISSUE DATE / TIME: 202105271622
Unit Type and Rh: 600
Unit Type and Rh: 9500

## 2019-12-20 LAB — COMPREHENSIVE METABOLIC PANEL
ALT: 105 U/L — ABNORMAL HIGH (ref 0–44)
AST: 124 U/L — ABNORMAL HIGH (ref 15–41)
Albumin: 2.1 g/dL — ABNORMAL LOW (ref 3.5–5.0)
Alkaline Phosphatase: 34 U/L — ABNORMAL LOW (ref 38–126)
Anion gap: 9 (ref 5–15)
BUN: 36 mg/dL — ABNORMAL HIGH (ref 6–20)
CO2: 26 mmol/L (ref 22–32)
Calcium: 8.3 mg/dL — ABNORMAL LOW (ref 8.9–10.3)
Chloride: 114 mmol/L — ABNORMAL HIGH (ref 98–111)
Creatinine, Ser: 1.73 mg/dL — ABNORMAL HIGH (ref 0.61–1.24)
GFR calc Af Amer: 58 mL/min — ABNORMAL LOW (ref >60)
GFR calc non Af Amer: 50 mL/min — ABNORMAL LOW (ref >60)
Glucose, Bld: 149 mg/dL — ABNORMAL HIGH (ref 70–99)
Potassium: 3.5 mmol/L (ref 3.5–5.1)
Sodium: 149 mmol/L — ABNORMAL HIGH (ref 135–145)
Total Bilirubin: 1.1 mg/dL (ref 0.3–1.2)
Total Protein: 5.3 g/dL — ABNORMAL LOW (ref 6.5–8.1)

## 2019-12-20 LAB — GLUCOSE, CAPILLARY
Glucose-Capillary: 119 mg/dL — ABNORMAL HIGH (ref 70–99)
Glucose-Capillary: 128 mg/dL — ABNORMAL HIGH (ref 70–99)
Glucose-Capillary: 155 mg/dL — ABNORMAL HIGH (ref 70–99)
Glucose-Capillary: 160 mg/dL — ABNORMAL HIGH (ref 70–99)
Glucose-Capillary: 171 mg/dL — ABNORMAL HIGH (ref 70–99)
Glucose-Capillary: 189 mg/dL — ABNORMAL HIGH (ref 70–99)

## 2019-12-20 LAB — BODY FLUID CELL COUNT WITH DIFFERENTIAL
Eos, Fluid: 3 %
Lymphs, Fluid: 5 %
Monocyte-Macrophage-Serous Fluid: 2 % — ABNORMAL LOW (ref 50–90)
Neutrophil Count, Fluid: 90 % — ABNORMAL HIGH (ref 0–25)
Total Nucleated Cell Count, Fluid: 99 cu mm (ref 0–1000)

## 2019-12-20 MED ORDER — VANCOMYCIN HCL 10 G IV SOLR
2500.0000 mg | Freq: Once | INTRAVENOUS | Status: AC
  Administered 2019-12-20: 2500 mg via INTRAVENOUS
  Filled 2019-12-20: qty 2500

## 2019-12-20 MED ORDER — VANCOMYCIN HCL 2000 MG/400ML IV SOLN
2000.0000 mg | INTRAVENOUS | Status: AC
  Administered 2019-12-21 – 2019-12-24 (×4): 2000 mg via INTRAVENOUS
  Filled 2019-12-20 (×4): qty 400

## 2019-12-20 MED ORDER — POTASSIUM CHLORIDE 20 MEQ/15ML (10%) PO SOLN
40.0000 meq | Freq: Once | ORAL | Status: AC
  Administered 2019-12-20: 40 meq
  Filled 2019-12-20: qty 30

## 2019-12-20 NOTE — Progress Notes (Signed)
A-line reading abnormal. RT attempted to resolve issue. A-line removed D/T improper readings

## 2019-12-20 NOTE — Progress Notes (Signed)
Assisted tele visit to patient with wife.  Koichi Platte McEachran, RN  

## 2019-12-20 NOTE — Progress Notes (Addendum)
NAME:  Clifford Nichols, MRN:  [id number], DOB:  [birth year], LOS: 25 [facility] ADMISSION DATE:  12/04/2019, [facility] Admission DATE:  12/04/19 REFERRING MD:  Dr Mortimer Fries, CCM, CHIEF COMPLAINT:  Acute respiratory failure   Brief History   39 year old obese man with hypertension, diabetes admitted with acute respiratory failure, ARDS from COVID-19 pneumonia, initial positive test 5/10  Past Medical History    has a past medical history of Abscess of upper back excluding scapular region, Diabetes mellitus without complication ([place]), GERD (gastroesophageal reflux disease), Hypertension, and Sebaceous cyst.  Significant Hospital Events   5/10 admitted [facility]  5/11 failed HHFNC, BiPAP.  Required intubation 5/12 transferred to [facility], evaluated by opthal for proptosis- no intervention required 5/17 Started paralytics, ECMO team consulted Not a candidate 5/19 Staring proning 5/23 Off pressors 5/26 Proning stopped, continue paralytics 5/27 New tongue wound 2/2 bite block in place, weaned off ketamine. Daily fevers  Consults:  Ophthalmology for proptosis 5/12  Procedures:  ETT 5/11 >>  L femoral CVC 5/11 >> 5/17 L IJ CVC 5/17 >>  R radial art line 5/17 >>   Significant Diagnostic Tests:  Head CT 5/11 >> no significant intracranial abnormality.  Scattered opacification and air-fluid levels in the frontal, ethmoid, sphenoid and maxillary sinuses of unclear significance.  Head CT 5/12 >> no significant intracranial abnormality  Lower extremity Doppler 5/14 >> negative for DVT  Micro Data:  SARS CoV2 5/10 >> positive MRSA screen 5/12 >> negative Respiratory 5/12 >> negative Blood 5/17 >> Staph epi and Staph hominis Respiratory 5/17 >> Staph epi 5/24 resp cx bal: staph epi 5/24 BAL acid fast: smear negative cx pending 5/24 BAL fungal:pending 5/26 blood: No growth  Antimicrobials:  Remdesivir 5/11 >> 5/15 Dexamethasone 5/11 >> 5/20 Tocilizumab 5/12 Cefepime 5/17 >> 5/21 Linezolid 5/17 >>  5/23  Zosyn 5/21 >>    Interim history/subjective:   Continues to be febrile, on deep sedation with paralytics  Objective   Blood pressure 121/69, pulse (!) 105, temperature (!) 102.1 F (38.9 C), temperature source Axillary, resp. rate (!) 35, height _0  (1.702 m), weight (!) 180.5 kg, SpO2 90 %.    Vent Mode: PRVC FiO2 (%):  [40 %-50 %] 40 % Set Rate:  [35 bmp] 35 bmp Vt Set:  [470 mL] 470 mL PEEP:  [10 HUT65-46 cmH20] 10 cmH20 Plateau Pressure:  [30 cmH20-34 cmH20] 33 cmH20   Intake/Output Summary (Last 24 hours) at 12/20/2019 5035 Last data filed at 12/20/2019 0800 Gross per 24 hour  Intake 6220.69 ml  Output 4487 ml  Net 1733.69 ml   Filed Weights   12/17/19 0500 12/19/19 0436 12/20/19 0400  Weight: (!) 180.8 kg (!) 179.9 kg (!) 180.5 kg    Examination: Gen:      No acute distress, obese HEENT:  EOMI, sclera anicteric Neck:     No masses; no thyromegaly, ETT Lungs:    Clear to auscultation bilaterally; normal respiratory effort CV:         Regular rate and rhythm; no murmurs Abd:      + bowel sounds; soft, non-tender; no palpable masses, no distension Ext:    + edema; adequate peripheral perfusion Skin:      Warm and dry; no rash Neuro: Sedated, paralyzed  Lab significant for sodium 149, creatinine 1.73 Slightly elevated transaminases WBC 9.4, hemoglobin 7.3, platelets 134  Resolved Hospital Problem list    Left proptosis, noted with coughing early 5/12, resolved.  Apparently per wife he has episodic intermittent  proptosis at home as well. Head CT 5/12 reassuring Ophthalmology did evaluate, recommended that he will need outpatient evaluation but no intervention at this time beyond keeping the eyes moist.  Assessment & Plan:   ARDS due to COVID-19 pneumonia  Morbid obesity, possible OSA.  Will certainly impact respiratory mechanics Continue vent support, wean FiO2 Will require higher PEEP due to obesity For trach today.  We will try to get him off  paralytics after procedure   Acute toxic metabolic encephalopathy, need for sedation -Deep sedation: Midazolam, fentanyl... off ketamine -Continue paralysis,will attempt to wean after tracheostomy -Continue enteral clonazepam and oxycodone as ordered  Septic shock not present on admission, fever.  - Due to COVID-19 pneumonia.  -Staph epi pneumonia and hematogenous spread with  bacteremia  recheck blood cx to provide reassurance of clearance +/contaminate : ngtd -completed 7 days linezolid -Started Zosyn on 5/21 due to concern for recurrent aspiration event still with staph epi/proteus in bal Since he continues to have fevers.  Vancomycin, remove Foley's.  Can send repeat BAL cultures during tracheostomy  AKI Stable.  Follow labs  Hypernatremia Continue free water, D5W  Thrombocytopenia Macrocytic anemia: Status post transfusion No overt bleeding.  Follow labs.  Hypertriglyceridemia Transitioned off propofol to ketamine, able to stop this as well Follow triglycerides periodically  Best practice:  Diet: TF Pain/Anxiety/Delirium protocol (if indicated):  fentanyl midazolam VAP protocol (if indicated): per protocol DVT prophylaxis: enoxaparin 0.5 mg/kg qday. Intermediate dosing due to obesity GI prophylaxis: pepcid Glucose control: SSI protocol Mobility: BR Code Status: Full Family Communication: Wife updated 5/28 Disposition: ICU  Critical care time:    The patient is critically ill with multiple organ system failure and requires high complexity decision making for assessment and support, frequent evaluation and titration of therapies, advanced monitoring, review of radiographic studies and interpretation of complex data.   Critical Care Time devoted to patient care services, exclusive of separately billable procedures, described in this note is 35 minutes.   Marshell Garfinkel MD [facility] Please see [email or web address] for pager details.  12/20/2019, 8:38  AM

## 2019-12-20 NOTE — Progress Notes (Signed)
Assisted tele visit to patient with wife.  Korianna Washer M, RN  

## 2019-12-20 NOTE — Progress Notes (Signed)
SLP Cancellation Note  Patient Details Name: Clifford Nichols MRN: [id number] DOB: 1980-12-30   Cancelled treatment:       Reason Eval/Treat Not Completed: Other (comment) Patient with new tracheostomy today. Orders for SLP eval and treat for PMSV and swallowing received. Will follow pt closely for readiness for SLP interventions as appropriate.      Mahala Menghini., M.A. CCC-SLP Acute Rehabilitation Services Pager [phone] Office [phone]  12/20/2019, 2:30 PM

## 2019-12-20 NOTE — Procedures (Signed)
Bronchoscopy  Indication: Trach placement, r/o HCAP  Consent: Signed in chart  Anesthesia: See trach note  Procedure - Timeout performed - Bronchoscope advanced through ETT - Airways examined down to subsegmental level - Following airway examination, performed BAL in RML and provided direct visualization of tracheostomy tube placement into trachea  Findings - Thick old secretions partially occluding ETT - BAL with cloudy return - Appropriate positioning of Shiley proximal XLT 8-0 within lumen of trachea  Specimen(s): RML BAL  Complications: None immediate

## 2019-12-20 NOTE — Procedures (Signed)
Procedure: Percutaneous Tracheostomy CPT 31600 Performed by: Dr. [name], Dr. Myrla Halsted Bronchoscopy Assistant: Dr. Myrla Halsted  Indications: Chronic respiratory failure and need for ongoing mechanical ventilation.  Consent: Signed in chart  Preprocedure: Universal protocol was followed for this procedure. Timeout performed. Anterior neck prepped and draped.  Anesthesia: The patient was intubated and sedated prior to the procedure. Additional midazolam, etomidate, fentanyl and vecuronium were given for sedation and paralysis with close attention to vital signs throughout procedure.   Procedure: The patient was placed in the supine position. The anterior neck was prepped and draped in usual sterile fashion. 1% lidocaine was administered approximately 2 fingerbreadths above the sternal notch for local anesthesia. A 1.5-cm vertical incision was then performed 2 fingerbreadths above the sternal notch. Using a curved Kelly, blunt dissection was performed down to the level of the pretracheal fascia. At this point, the bronchoscope was introduced through the endotracheal tube and the trachea was properly visualized. The endotracheal tube was then gradually withdrawn within the trachea under direct bronchoscopic visualization. Proper midline position was confirmed by bouncing the needle from the tracheostomy tray over the trachea with bronchoscopic examination. The needle was advanced into the trachea and proper positioning was confirmed with direct visualization. The needle was then removed leaving a white outer cannula in position. The wire from the tracheostomy tray was then advanced through the white outer cannula. The cannula was then removed. The small, blue dilator was then advanced over the wire into the trachea. Once proper dilatation was achieved, the dilator was removed. The large, tapered dilator was then advanced over the wire into the trachea. The dilator was removed leaving the wire and white  inner cannula in position. A number 8 XLT percutaneous Shiley tracheostomy tube was then advanced over the wire and white inner cannula into the trachea. Proper positioning was confirmed with bronchoscopic visualization. The tracheostomy tube was then sutured in place with four nylon sutures. It was further secured with a tracheostomy tie.  Estimated blood loss: 20cc  Complications: None immediate.  CXR ordered.   [name] MD [place] Please see [email or web address] for pager details.  12/20/2019, 3:20 PM

## 2019-12-20 NOTE — Progress Notes (Signed)
K 3.5 Electrolytes replaced per protocol 

## 2019-12-20 NOTE — Progress Notes (Signed)
Pharmacy Antibiotic Note  PHU RECORD is a 39 y.o. male admitted on 12/04/2019 with sepsis.  Pharmacy has been consulted for Vancomycin dosing. Remains on Zosyn dosing.   Patient is persistently febrile after completing 7 days of Zyvox for Staph Epi pneumonia and completing 7 days of Zosyn for Aspiration pneumonia. Adding Vancomycin for staph coverage while repeat blood cultures pending. SCr elevated but stable at 1.73 with normalizedCrCl ~60 mL/min.   Plan: Vancomycin 2500 mg IV x1, then 2021m IV every 24 hours.  Monitor renal function, culture results, and clinical status.  Monitor fever curve - consider if need to broaden further.  Height: _0  (170.2 cm) Weight: (!) 180.5 kg (397 lb 14.9 oz) IBW/kg (Calculated) : 66.1  Temp (24hrs), Avg:101.7 F (38.7 C), Min:100.4 F (38 C), Max:103.1 F (39.5 C)  Recent Labs  Lab 12/16/19 0454 12/17/19 0615 12/18/19 0310 12/19/19 0311 12/20/19 0229  WBC 11.0* 10.0 8.8 7.9 9.4  CREATININE 2.20* 1.94* 1.71* 1.72* 1.73*    Estimated Creatinine Clearance: 93.4 mL/min (A) (by C-G formula based on SCr of 1.73 mg/dL (H)).    No Known Allergies  Antimicrobials this admission: Cefepime 5/17 >> 5/20 Zyvox 5/17 >> 5/23 Zosyn 5/21 >> (5/27) Vanc 5/28 >>  Dose adjustments this admission:   Microbiology results: 5/12 TA - negative 5/12 MRSA - negative 5/17 TA - Staph epidermidis (S Cipro, vanc, clinda) 5/17 BCx - Staph hominis and Staph epidermidis, likely contaminant 5/24 BAL - Rare Proteus mirabilis, Rare Staph epidermidis 5/26 BCx >>  Thank you for allowing pharmacy to be a part of this patient's care.  JSloan Leiter PharmD, BCPS, BCCCP Clinical Pharmacist Please refer to [facility]for MView Park-Windsor Hillsnumbers 12/20/2019 8:33 AM

## 2019-12-21 ENCOUNTER — Inpatient Hospital Stay (HOSPITAL_COMMUNITY): Payer: No Typology Code available for payment source

## 2019-12-21 DIAGNOSIS — J9622 Acute and chronic respiratory failure with hypercapnia: Secondary | ICD-10-CM

## 2019-12-21 DIAGNOSIS — J9621 Acute and chronic respiratory failure with hypoxia: Secondary | ICD-10-CM

## 2019-12-21 LAB — CBC
HCT: 29.9 % — ABNORMAL LOW (ref 39.0–52.0)
Hemoglobin: 8.3 g/dL — ABNORMAL LOW (ref 13.0–17.0)
MCH: 30.1 pg (ref 26.0–34.0)
MCHC: 27.8 g/dL — ABNORMAL LOW (ref 30.0–36.0)
MCV: 108.3 fL — ABNORMAL HIGH (ref 80.0–100.0)
Platelets: 128 10*3/uL — ABNORMAL LOW (ref 150–400)
RBC: 2.76 MIL/uL — ABNORMAL LOW (ref 4.22–5.81)
RDW: 19 % — ABNORMAL HIGH (ref 11.5–15.5)
WBC: 11.5 10*3/uL — ABNORMAL HIGH (ref 4.0–10.5)
nRBC: 0.3 % — ABNORMAL HIGH (ref 0.0–0.2)

## 2019-12-21 LAB — COMPREHENSIVE METABOLIC PANEL
ALT: 118 U/L — ABNORMAL HIGH (ref 0–44)
AST: 130 U/L — ABNORMAL HIGH (ref 15–41)
Albumin: 2.1 g/dL — ABNORMAL LOW (ref 3.5–5.0)
Alkaline Phosphatase: 37 U/L — ABNORMAL LOW (ref 38–126)
Anion gap: 10 (ref 5–15)
BUN: 39 mg/dL — ABNORMAL HIGH (ref 6–20)
CO2: 26 mmol/L (ref 22–32)
Calcium: 8.2 mg/dL — ABNORMAL LOW (ref 8.9–10.3)
Chloride: 111 mmol/L (ref 98–111)
Creatinine, Ser: 1.47 mg/dL — ABNORMAL HIGH (ref 0.61–1.24)
GFR calc Af Amer: >60 mL/min (ref >60)
GFR calc non Af Amer: >60 mL/min (ref >60)
Glucose, Bld: 205 mg/dL — ABNORMAL HIGH (ref 70–99)
Potassium: 3.9 mmol/L (ref 3.5–5.1)
Sodium: 147 mmol/L — ABNORMAL HIGH (ref 135–145)
Total Bilirubin: 1.1 mg/dL (ref 0.3–1.2)
Total Protein: 5.5 g/dL — ABNORMAL LOW (ref 6.5–8.1)

## 2019-12-21 LAB — GLUCOSE, CAPILLARY
Glucose-Capillary: 190 mg/dL — ABNORMAL HIGH (ref 70–99)
Glucose-Capillary: 176 mg/dL — ABNORMAL HIGH (ref 70–99)
Glucose-Capillary: 182 mg/dL — ABNORMAL HIGH (ref 70–99)
Glucose-Capillary: 148 mg/dL — ABNORMAL HIGH (ref 70–99)
Glucose-Capillary: 167 mg/dL — ABNORMAL HIGH (ref 70–99)
Glucose-Capillary: 199 mg/dL — ABNORMAL HIGH (ref 70–99)

## 2019-12-21 LAB — PHOSPHORUS: Phosphorus: 4.2 mg/dL (ref 2.5–4.6)

## 2019-12-21 LAB — MAGNESIUM: Magnesium: 2 mg/dL (ref 1.7–2.4)

## 2019-12-21 MED ORDER — FUROSEMIDE 10 MG/ML IJ SOLN
40.0000 mg | Freq: Two times a day (BID) | INTRAMUSCULAR | Status: DC
  Administered 2019-12-21 – 2019-12-24 (×7): 40 mg via INTRAVENOUS
  Filled 2019-12-21 (×7): qty 4

## 2019-12-21 NOTE — Progress Notes (Signed)
SLP Cancellation Note  Patient Details Name: [name] MRN: [id number] DOB: Jan 27, 1981   Cancelled treatment:       Reason Eval/Treat Not Completed: Medical issues which prohibited therapy   Tressie Stalker, M.S., CCC-SLP 12/21/2019, 8:46 AM

## 2019-12-21 NOTE — Progress Notes (Signed)
PT Cancellation Note  Patient Details Name: Clifford Nichols MRN: [id number] DOB: January 06, 1981   Cancelled Treatment:    Reason Eval/Treat Not Completed: Medical issues which prohibited therapy  Patient remains sedated post-trach (RASS -4). Spoke with RN and plan is to allow him to begin waking up. Will reassess 5/30 if appropriate to proceed with PT evaluation.    Jerolyn Center, PT Pager [phone]   Zena Amos 12/21/2019, 8:31 AM

## 2019-12-21 NOTE — Progress Notes (Addendum)
NAME:  TAKESHI Nichols, MRN:  [id number], DOB:  Sep 12, 1980, LOS: 73 [facility] ADMISSION DATE:  12/04/2019, [facility] Admission DATE:  12/04/19 REFERRING MD:  Dr Mortimer Fries, CCM, CHIEF COMPLAINT:  Acute respiratory failure   Brief History   39 year old obese man with hypertension, diabetes admitted with acute respiratory failure, ARDS from COVID-19 pneumonia, initial positive test 5/10  Past Medical History    has a past medical history of Abscess of upper back excluding scapular region, Diabetes mellitus without complication ([place]), GERD (gastroesophageal reflux disease), Hypertension, and Sebaceous cyst.  Significant Hospital Events   5/10 admitted [facility]  5/11 failed HHFNC, BiPAP.  Required intubation 5/12 transferred to [facility], evaluated by opthal for proptosis- no intervention required 5/17 Started paralytics, ECMO team consulted Not a candidate 5/19 Staring proning 5/23 Off pressors 5/26 Proning stopped, continue paralytics 5/27 New tongue wound 2/2 bite block in place, weaned off ketamine. Daily fevers 5/28 Trach, stop paralytics  Consults:  Ophthalmology for proptosis 5/12  Procedures:  ETT 5/11 >> 5/28 Trach 5/28 L femoral CVC 5/11 >> 5/17 R radial art line 5/17 >> 5/28  L IJ CVC 5/17 >>   Significant Diagnostic Tests:  Head CT 5/11 >> no significant intracranial abnormality.  Scattered opacification and air-fluid levels in the frontal, ethmoid, sphenoid and maxillary sinuses of unclear significance.  Head CT 5/12 >> no significant intracranial abnormality  Lower extremity Doppler 5/14 >> negative for DVT  Micro Data:  SARS CoV2 5/10 >> positive MRSA screen 5/12 >> negative Respiratory 5/12 >> negative Blood 5/17 >> Staph epi and Staph hominis Respiratory 5/17 >> Staph epi 5/24 resp cx bal: staph epi 5/24 BAL acid fast: smear negative cx pending 5/24 BAL fungal:pending 5/26 blood: No growth  Antimicrobials:  Remdesivir 5/11 >> 5/15 Dexamethasone 5/11 >> 5/20 Tocilizumab  5/12 Cefepime 5/17 >> 5/21 Linezolid 5/17 >> 5/23  Zosyn 5/21 >>   Vanco 5/28 >>  Interim history/subjective:   Underwent tracheostomy yesterday.  Remains on the vent  Objective   Blood pressure 120/71, pulse 98, temperature 99.1 F (37.3 C), temperature source Axillary, resp. rate (!) 35, height _0  (1.702 m), weight (!) 187.7 kg, SpO2 96 %.    Vent Mode: PRVC FiO2 (%):  [40 %-100 %] 65 % Set Rate:  [35 bmp] 35 bmp Vt Set:  [470 mL] 470 mL PEEP:  [10 cmH20] 10 cmH20 Plateau Pressure:  [21 cmH20-30 cmH20] 26 cmH20   Intake/Output Summary (Last 24 hours) at 12/21/2019 0810 Last data filed at 12/21/2019 0700 Gross per 24 hour  Intake 5334.89 ml  Output 3175 ml  Net 2159.89 ml   Filed Weights   12/19/19 0436 12/20/19 0400 12/21/19 0500  Weight: (!) 179.9 kg (!) 180.5 kg (!) 187.7 kg    Examination: Gen:      No acute distress, obese HEENT:  EOMI, sclera anicteric, mild tongue necrosis on left side Neck:     No masses; no thyromegaly, trach Lungs:    Clear to auscultation bilaterally; normal respiratory effort CV:         Regular rate and rhythm; no murmurs Abd:      + bowel sounds; soft, non-tender; no palpable masses, no distension Ext:    2+ edema; adequate peripheral perfusion Skin:      Warm and dry; no rash Neuro: Sedated  Labs significant for glucose 205, BUN/creatinine 39/1.47, slightly improved transaminases, WBC 11.5 Chest x-ray 5/29-trach in good 47, stable bilateral airspace opacities.  Resolved Hospital Problem list  Left proptosis, noted with coughing early 5/12, resolved.  Apparently per wife he has episodic intermittent proptosis at home as well. Head CT 5/12 reassuring Ophthalmology did evaluate, recommended that he will need outpatient evaluation but no intervention at this time beyond keeping the eyes moist.  Assessment & Plan:   ARDS due to COVID-19 pneumonia  Morbid obesity, possible OSA.  Will certainly impact respiratory  mechanics Continue vent support, wean FiO2 Will require higher PEEP due to obesity Off paralytics.  Wean FiO2 first   Acute toxic metabolic encephalopathy, need for sedation Wean sedation.  Change RASS goal to -2 -Continue enteral clonazepam and oxycodone as ordered  Septic shock not present on admission, fever.  - Due to COVID-19 pneumonia.  -Staph epi pneumonia and hematogenous spread with  bacteremia  recheck blood cx to provide reassurance of clearance +/contaminate : ngtd -completed 7 days linezolid Started Zosyn on 5/21 due to concern for recurrent aspiration event still with staph epi/proteus in bal Foley was removed and vancomycin started Follow repeat BAL cultures  AKI Stable.  Follow labs Start diuresis as he is over 20 L positive  Hypernatremia Continue free water, D5W  Thrombocytopenia Macrocytic anemia: Status post transfusion No overt bleeding.  Follow labs.  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  Monitor Now that ET tube is out hopefully it will stabilize and improve.  Best practice:  Diet: TF Pain/Anxiety/Delirium protocol (if indicated):  fentanyl midazolam VAP protocol (if indicated): per protocol DVT prophylaxis: enoxaparin 0.5 mg/kg qday. Intermediate dosing due to obesity GI prophylaxis: pepcid Glucose control: SSI protocol Mobility: BR Code Status: Full Family Communication: Wife updated 5/28, Mom updated 5/29 Disposition: ICU  Critical care time:    The patient is critically ill with multiple organ system failure and requires high complexity decision making for assessment and support, frequent evaluation and titration of therapies, advanced monitoring, review of radiographic studies and interpretation of complex data.   Critical Care Time devoted to patient care services, exclusive of  separately billable procedures, described in this note is 35 minutes.   Marshell Garfinkel MD [place] [facility] Please see [email or web address] for pager details.  12/21/2019, 8:10 AM

## 2019-12-21 NOTE — Progress Notes (Signed)
Assisted tele visit to patient with wife.  Seth Higginbotham M, RN  

## 2019-12-21 NOTE — Progress Notes (Signed)
Bite block placed D/T pt biting tounge

## 2019-12-22 ENCOUNTER — Inpatient Hospital Stay (HOSPITAL_COMMUNITY): Payer: No Typology Code available for payment source

## 2019-12-22 DIAGNOSIS — E1165 Type 2 diabetes mellitus with hyperglycemia: Secondary | ICD-10-CM

## 2019-12-22 LAB — GLUCOSE, CAPILLARY
Glucose-Capillary: 172 mg/dL — ABNORMAL HIGH (ref 70–99)
Glucose-Capillary: 179 mg/dL — ABNORMAL HIGH (ref 70–99)
Glucose-Capillary: 187 mg/dL — ABNORMAL HIGH (ref 70–99)
Glucose-Capillary: 225 mg/dL — ABNORMAL HIGH (ref 70–99)
Glucose-Capillary: 229 mg/dL — ABNORMAL HIGH (ref 70–99)
Glucose-Capillary: 239 mg/dL — ABNORMAL HIGH (ref 70–99)

## 2019-12-22 LAB — CBC
HCT: 31.7 % — ABNORMAL LOW (ref 39.0–52.0)
Hemoglobin: 9.1 g/dL — ABNORMAL LOW (ref 13.0–17.0)
MCH: 30.6 pg (ref 26.0–34.0)
MCHC: 28.7 g/dL — ABNORMAL LOW (ref 30.0–36.0)
MCV: 106.7 fL — ABNORMAL HIGH (ref 80.0–100.0)
Platelets: 122 10*3/uL — ABNORMAL LOW (ref 150–400)
RBC: 2.97 MIL/uL — ABNORMAL LOW (ref 4.22–5.81)
RDW: 18 % — ABNORMAL HIGH (ref 11.5–15.5)
WBC: 11.1 10*3/uL — ABNORMAL HIGH (ref 4.0–10.5)
nRBC: 0.3 % — ABNORMAL HIGH (ref 0.0–0.2)

## 2019-12-22 LAB — COMPREHENSIVE METABOLIC PANEL
ALT: 117 U/L — ABNORMAL HIGH (ref 0–44)
AST: 107 U/L — ABNORMAL HIGH (ref 15–41)
Albumin: 2.1 g/dL — ABNORMAL LOW (ref 3.5–5.0)
Alkaline Phosphatase: 45 U/L (ref 38–126)
Anion gap: 12 (ref 5–15)
BUN: 32 mg/dL — ABNORMAL HIGH (ref 6–20)
CO2: 27 mmol/L (ref 22–32)
Calcium: 8.6 mg/dL — ABNORMAL LOW (ref 8.9–10.3)
Chloride: 107 mmol/L (ref 98–111)
Creatinine, Ser: 1.31 mg/dL — ABNORMAL HIGH (ref 0.61–1.24)
GFR calc Af Amer: >60 mL/min (ref >60)
GFR calc non Af Amer: >60 mL/min (ref >60)
Glucose, Bld: 187 mg/dL — ABNORMAL HIGH (ref 70–99)
Potassium: 3.3 mmol/L — ABNORMAL LOW (ref 3.5–5.1)
Sodium: 146 mmol/L — ABNORMAL HIGH (ref 135–145)
Total Bilirubin: 0.7 mg/dL (ref 0.3–1.2)
Total Protein: 6 g/dL — ABNORMAL LOW (ref 6.5–8.1)

## 2019-12-22 LAB — MAGNESIUM: Magnesium: 2 mg/dL (ref 1.7–2.4)

## 2019-12-22 LAB — PHOSPHORUS: Phosphorus: 3.7 mg/dL (ref 2.5–4.6)

## 2019-12-22 MED ORDER — FREE WATER
200.0000 mL | Status: DC
  Administered 2019-12-22 – 2019-12-23 (×12): 200 mL

## 2019-12-22 MED ORDER — POTASSIUM CHLORIDE 20 MEQ/15ML (10%) PO SOLN
60.0000 meq | Freq: Once | ORAL | Status: AC
  Administered 2019-12-22: 60 meq
  Filled 2019-12-22: qty 45

## 2019-12-22 NOTE — Progress Notes (Addendum)
NAME:  Clifford Nichols, MRN:  [id number], DOB:  1981/07/19, LOS: 37 [facility] ADMISSION DATE:  12/04/2019, [facility] Admission DATE:  12/04/19 REFERRING MD:  Dr Mortimer Fries, CCM, CHIEF COMPLAINT:  Acute respiratory failure   Brief History   39 year old obese man with hypertension, diabetes admitted with acute respiratory failure, ARDS from COVID-19 pneumonia, initial positive test 5/10  Past Medical History    has a past medical history of Abscess of upper back excluding scapular region, Diabetes mellitus without complication ([place]), GERD (gastroesophageal reflux disease), Hypertension, and Sebaceous cyst.  Significant Hospital Events   5/10 admitted [facility]  5/11 failed HHFNC, BiPAP.  Required intubation 5/12 transferred to [facility], evaluated by opthal for proptosis- no intervention required 5/17 Started paralytics, ECMO team consulted Not a candidate 5/19 Started proning 5/23 Off pressors 5/26 Proning stopped, continue paralytics 5/27 New tongue wound 2/2 bite block in place, weaned off ketamine. Daily fevers 5/28 Trach, stop paralytics 5/30 CXR improving with diuresis  Consults:  Ophthalmology for proptosis 5/12  Procedures:  ETT 5/11 >> 5/28 L femoral CVC 5/11 >> 5/17 R radial art line 5/17 >> 5/28  Trach 5/28 L IJ CVC 5/17 >>   Significant Diagnostic Tests:  Head CT 5/11 >> no significant intracranial abnormality.  Scattered opacification and air-fluid levels in the frontal, ethmoid, sphenoid and maxillary sinuses of unclear significance.  Head CT 5/12 >> no significant intracranial abnormality  Lower extremity Doppler 5/14 >> negative for DVT  Micro Data:  SARS CoV2 5/10 >> positive MRSA screen 5/12 >> negative Respiratory 5/12 >> negative Blood 5/17 >> Staph epi and Staph hominis Respiratory 5/17 >> Staph epi 5/24 resp cx bal: staph epi 5/24 BAL acid fast: smear negative cx pending 5/24 BAL fungal:pending 5/26 blood: No growth BAL 5/28-Acinetobacter, Proteus  Antimicrobials:   Remdesivir 5/11 >> 5/15 Dexamethasone 5/11 >> 5/20 Tocilizumab 5/12 Cefepime 5/17 >> 5/21 Linezolid 5/17 >> 5/23 Zosyn 5/21 >>  5/29  Vanco 5/28 >>  Interim history/subjective:   No acute events overnight.  Remains on the ventilator More awake today  Objective   Blood pressure (!) 151/97, pulse (!) 102, temperature 99.3 F (37.4 C), temperature source Core (Comment), resp. rate (!) 34, height _0  (1.702 m), weight (!) 185 kg, SpO2 93 %.    Vent Mode: PRVC FiO2 (%):  [50 %-60 %] 50 % Set Rate:  [35 bmp] 35 bmp Vt Set:  [470 mL] 470 mL PEEP:  [10 NKN39-76 cmH20] 10 cmH20 Plateau Pressure:  [22 cmH20-27 cmH20] 26 cmH20   Intake/Output Summary (Last 24 hours) at 12/22/2019 0854 Last data filed at 12/22/2019 0700 Gross per 24 hour  Intake 5590.93 ml  Output 9250 ml  Net -3659.07 ml   Filed Weights   12/20/19 0400 12/21/19 0500 12/22/19 0315  Weight: (!) 180.5 kg (!) 187.7 kg (!) 185 kg    Examination: Gen:      No acute distress,. obese HEENT:  EOMI, sclera anicteric, tongue swelling improved Neck:     No masses; no thyromegaly, trach Lungs:    Clear to auscultation bilaterally; normal respiratory effort CV:         Regular rate and rhythm; no murmurs Abd:      + bowel sounds; soft, non-tender; no palpable masses, no distension Ext:    2+  edema; adequate peripheral perfusion Skin:      Warm and dry; no rash Neuro: More awake today.Follows commands  Labs significant for sodium 146, potassium 3.3, BUN/creatinine 32/1.31, WBC 11.1 Chest  x-ray 5/30-stable support apparatus.  Improving bilateral airspace opacities.  Resolved Hospital Problem list    Left proptosis, noted with coughing early 5/12, resolved.  Apparently per wife he has episodic intermittent proptosis at home as well. Head CT 5/12 reassuring Ophthalmology did evaluate, recommended that he will need outpatient evaluation but no intervention at this time beyond keeping the eyes moist.  Assessment & Plan:    ARDS due to COVID-19 pneumonia  Morbid obesity, possible OSA.  Will certainly impact respiratory mechanics Acinetobacter HAP Continue vent support, wean FiO2 Will require higher PEEP due to obesity Off paralytics.   Start SBT's when mental status allows Will discuss with ID about appropriate antibiotic coverage.   Acute toxic metabolic encephalopathy, need for sedation Wean sedation.  Change RASS goal to -1 -Continue enteral clonazepam and oxycodone as ordered  Septic shock not present on admission, fever.  - Due to COVID-19 pneumonia.  -Staph epi pneumonia and hematogenous spread with  bacteremia  recheck blood cx to provide reassurance of clearance +/contaminate : ngtd -completed 7 days linezolid Currently on Zosyn, vancomycin ID consult for Acinetobacter  AKI Stable.  Follow labs Continue diuresis  Hypernatremia Continue free water, D5W  Thrombocytopenia Macrocytic anemia: Status post transfusion No overt bleeding.  Follow labs.  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  Monitor Now that ET tube is out hopefully it will stabilize and improve.  Best practice:  Diet: TF Pain/Anxiety/Delirium protocol (if indicated):  fentanyl midazolam VAP protocol (if indicated): per protocol DVT prophylaxis: enoxaparin 0.5 mg/kg qday. Intermediate dosing due to obesity GI prophylaxis: pepcid Glucose control: SSI protocol Mobility: PT consult Code Status: Full Family Communication: Wife updated 5/30, Mom updated 5/29 Disposition: ICU  Critical care time:    The patient is critically ill with multiple organ system failure and requires high complexity decision making for assessment and support, frequent evaluation and titration of therapies, advanced monitoring, review of radiographic studies and interpretation of complex  data.   Critical Care Time devoted to patient care services, exclusive of separately billable procedures, described in this note is 35 minutes.   Marshell Garfinkel MD [facility] Please see [email or web address] for pager details.  12/22/2019, 8:54 AM

## 2019-12-22 NOTE — Progress Notes (Addendum)
Incorrect waste documentation on Mr Clifford Nichols : His Fentenyl was not wasted : waste on fentanyl was supposed to be on 3M11

## 2019-12-22 NOTE — Progress Notes (Signed)
Pharmacy Electrolyte Replacement  Recent Labs:  Recent Labs    12/22/19 0226  K 3.3*  MG 2.0  PHOS 3.7  CREATININE 1.31*    Low Critical Values (K </= 2.5, Phos </= 1, Mg </= 1) Present: No critical values - K 3.3   Plan: Potassium 60 mEq per tube x1   Jettie Pagan, PharmD PGY2 Infectious Disease Pharmacy Resident

## 2019-12-22 NOTE — Progress Notes (Signed)
Assisted tele visit to patient with wife.  Tamala Manzer M, RN  

## 2019-12-22 NOTE — Progress Notes (Signed)
PT Cancellation Note  Patient Details Name: Clifford Nichols MRN: [id number] DOB: [birth year]   Cancelled Treatment:    Reason Eval/Treat Not Completed: Medical issues which prohibited therapy  Called to unit to see if pt would be able to participate in PT evaluation. Advised pt not currently appropriate as just received a bolus and needs about an hour prior to re-assess his appropriateness. Will plan to attempt to see 12/23/19-hopefully earlier in the day as appropriate.   Jerolyn Center, PT Pager [phone]  Clifford Nichols 12/22/2019, 3:39 PM

## 2019-12-22 NOTE — Consult Note (Signed)
Date of Admission:  12/04/2019          Reason for Consult: Multidrug-resistant organisms isolated on BAL   Referring Provider: Dr Vaughan Browner   Assessment:  1. Severe COVID 19 with protracted hospital course and tracheostomy\ 2. Positive blood cultures from the 17th that were discordant and likely represented contamination at 2 sites 3. Possible superimposed pneumonia that seems to be better after treatment with vancomycin  Plan:  1. Would give 2 more days of vancomycin and then stop antibiotics 2. Monitor for recurrence of fevers    We will sign off for now please call with further questions.  Active Problems:   HTN (hypertension)   Acute respiratory failure due to COVID-19 (Clifford Nichols)   Hyperglycemia   Diabetes (Clifford Nichols)   COVID-19   ARDS (adult respiratory distress syndrome) (HCC)   Acute respiratory failure (HCC)   Acute on chronic respiratory failure with hypoxia and hypercapnia (HCC)   Scheduled Meds: . vitamin C  500 mg Per Tube Daily  . chlorhexidine gluconate (MEDLINE KIT)  15 mL Mouth Rinse BID  . Chlorhexidine Gluconate Cloth  6 each Topical Daily  . clonazePAM  1 mg Per Tube BID  . enoxaparin (LOVENOX) injection  0.5 mg/kg Subcutaneous Q24H  . famotidine  20 mg Per Tube BID  . feeding supplement (PRO-STAT SUGAR FREE 64)  60 mL Per Tube TID  . fentaNYL (SUBLIMAZE) injection  50 mcg Intravenous Once  . free water  200 mL Per Tube Q2H  . furosemide  40 mg Intravenous Q12H  . insulin aspart  0-20 Units Subcutaneous Q4H  . mouth rinse  15 mL Mouth Rinse 10 times per day  . oxyCODONE  10 mg Per Tube Q4H  . sodium chloride flush  10-40 mL Intracatheter Q12H  . zinc sulfate  220 mg Per Tube Daily   Continuous Infusions: . sodium chloride    . sodium chloride 10 mL/hr at 12/22/19 0600  . cisatracurium (NIMBEX) infusion Stopped (12/20/19 1939)  . dextrose 50 mL/hr at 12/22/19 1200  . feeding supplement (VITAL 1.5 CAL) 1,000 mL (12/21/19 2100)  . fentaNYL infusion  INTRAVENOUS 225 mcg/hr (12/22/19 1200)  . midazolam 3 mg/hr (12/22/19 1200)  . phenylephrine (NEO-SYNEPHRINE) Adult infusion Stopped (12/17/19 1317)  . vancomycin Stopped (12/22/19 1056)   PRN Meds:.Place/Maintain arterial line **AND** sodium chloride, sodium chloride, acetaminophen (TYLENOL) oral liquid 160 mg/5 mL, albuterol, docusate, fentaNYL, labetalol, midazolam, polyethylene glycol, sodium chloride flush  HPI: Clifford Nichols is a 38 y.o. male with morbid obesity DM who developed COVID 19 infection with ARDS in early May with admissions [facility] [facility] and transferred to [facility].  He had severe ARDS and ultimately required tracheostomy.  He has had a protracted course and remains mechanically ventilated.  In addition to being treated with remdesivir Tocilizumaband steroids for his COVID-19 infection he received cefepime and Zyvox from 17 May through the 23rd he was also on cefepime the 17th the 21st then changed to Zosyn which she was on for 29 May.  Tracheal cultures initially were negative.  He then in the context of fevers had blood cultures performed and on the 17th he grew discordant cultures with a positive culture in 1 site for Staph epidermidis and not culture positive and another site for different coagulase-negative Staphylococcus namely Staphylococcus hominis.  He had a spike in his fever on 24 May that continued through the 27th.  He had tracheal aspirate which grew Staph epidermidis on the 24th.  On the 28th he underwent repeat cultures from tracheal aspirate and was taken off Zosyn and placed on vancomycin which he has been on for the last 3 days.  In the interim his fever curve is improved and his malaria settings have improved.  Cultures from most recent BAL is growing 7000 colony forming units of Proteus mirabilis 5000 colonies per mL of Enterococcus faecalis 7000 colonies of Acinetobacter along with 1000 of Pseudomonas.  The Acinetobacter is multidrug-resistant  with sensitivity only to Unasyn and Bactrim.    Review of Systems: Review of Systems  Unable to perform ROS: Critical illness    Past Medical History:  Diagnosis Date  . Abscess of upper back excluding scapular region   . Diabetes mellitus without complication ([place])   . GERD (gastroesophageal reflux disease)    OCC-NO MEDS  . Hypertension   . Sebaceous cyst     Social History   Tobacco Use  . Smoking status: Former Smoker    Packs/day: 0.50    Types: Cigarettes    Quit date: 12/31/2017    Years since quitting: 1.9  . Smokeless tobacco: Never Used  Substance Use Topics  . Alcohol use: Not Currently  . Drug use: No    Family History  Problem Relation Age of Onset  . Hypertension Father    No Known Allergies  OBJECTIVE: Blood pressure (!) 162/92, pulse (!) 104, temperature 100 F (37.8 C), temperature source Core, resp. rate (!) 40, height _0  (1.702 m), weight (!) 185 kg, SpO2 91 %.  Physical Exam Constitutional:      Appearance: He is obese.     Interventions: He is intubated.  Cardiovascular:     Rate and Rhythm: Tachycardia present.  Pulmonary:     Effort: No respiratory distress. He is intubated.  Abdominal:     General: There is no distension.  Skin:    General: Skin is warm and dry.     Coloration: Skin is not jaundiced or pale.  Neurological:     General: No focal deficit present.     Mental Status: He is unresponsive.     Motor: Motor function is intact.     Lab Results Lab Results  Component Value Date   WBC 11.1 (H) 12/22/2019   HGB 9.1 (L) 12/22/2019   HCT 31.7 (L) 12/22/2019   MCV 106.7 (H) 12/22/2019   PLT 122 (L) 12/22/2019    Lab Results  Component Value Date   CREATININE 1.31 (H) 12/22/2019   BUN 32 (H) 12/22/2019   NA 146 (H) 12/22/2019   K 3.3 (L) 12/22/2019   CL 107 12/22/2019   CO2 27 12/22/2019    Lab Results  Component Value Date   ALT 117 (H) 12/22/2019   AST 107 (H) 12/22/2019   ALKPHOS 45 12/22/2019   BILITOT  0.7 12/22/2019     Microbiology: Recent Results (from the past 240 hour(s))  Culture, respiratory     Status: None   Collection Time: 12/16/19 10:50 AM   Specimen: Bronchoalveolar Lavage; Respiratory  Result Value Ref Range Status   Specimen Description BRONCHIAL ALVEOLAR LAVAGE  Final   Special Requests NONE  Final   Gram Stain   Final    FEW WBC PRESENT, PREDOMINANTLY PMN NO ORGANISMS SEEN    Culture   Final    Consistent with normal respiratory flora. Performed at [facility], [place] [address] [place], [place] [address]    Report Status 12/19/2019 FINAL  Final  Acid Fast Smear (AFB)     Status: None   Collection Time: 12/16/19 11:11 AM   Specimen: Bronchoalveolar Lavage  Result Value Ref Range Status   AFB Specimen Processing Concentration  Final   Acid Fast Smear Negative  Final    Comment: (NOTE) Performed At: [facility] [place], [place] [id number] Rush Farmer MD [id number]    Source (AFB) BRONCHIAL ALVEOLAR LAVAGE  Final  Fungus Culture With Stain     Status: None (Preliminary result)   Collection Time: 12/16/19 11:11 AM   Specimen: Bronchoalveolar Lavage  Result Value Ref Range Status   Fungus Stain Final report  Final    Comment: (NOTE) Performed At: [facility] [place], [place] [id number] Rush Farmer MD UG:[id number]    Fungus (Mycology) Culture PENDING  Incomplete   Fungal Source BRONCHIAL ALVEOLAR LAVAGE  Final  Fungus Culture Result     Status: None   Collection Time: 12/16/19 11:11 AM  Result Value Ref Range Status   Result 1 Comment  Final    Comment: (NOTE) KOH/Calcofluor preparation:  no fungus observed. Performed At: [facility] [place], [place] [id number] Rush Farmer MD ZP:[id number]   Culture, blood (Routine X 2) w Reflex to ID Panel     Status: None (Preliminary result)   Collection Time: 12/18/19  1:10 PM   Specimen: BLOOD  Result Value Ref Range  Status   Specimen Description BLOOD BLOOD LEFT HAND  Final   Special Requests AEROBIC BOTTLE ONLY Blood Culture adequate volume  Final   Culture   Final    NO GROWTH 4 DAYS Performed at [facility], [place] [address], [place], [place] [address]    Report Status PENDING  Incomplete  Culture, blood (Routine X 2) w Reflex to ID Panel     Status: None (Preliminary result)   Collection Time: 12/18/19  1:10 PM   Specimen: BLOOD  Result Value Ref Range Status   Specimen Description BLOOD BLOOD LEFT HAND  Final   Special Requests AEROBIC BOTTLE ONLY Blood Culture adequate volume  Final   Culture   Final    NO GROWTH 4 DAYS Performed at [facility], [place] [address] [place], [place] [address]    Report Status PENDING  Incomplete  Culture, bal-quantitative     Status: Abnormal (Preliminary result)   Collection Time: 12/20/19 12:38 PM   Specimen: Bronchoalveolar Lavage  Result Value Ref Range Status   Specimen Description BRONCHIAL ALVEOLAR LAVAGE  Final   Special Requests NONE  Final   Gram Stain   Final    RARE WBC PRESENT, PREDOMINANTLY PMN NO ORGANISMS SEEN    Culture (A)  Final    7,000 COLONIES/mL PROTEUS MIRABILIS 5,000 COLONIES/mL ENTEROCOCCUS FAECALIS 7,000 COLONIES/mL ACINETOBACTER CALCOACETICUS/BAUMANNII COMPLEX 1,000 COLONIES/mL PSEUDOMONAS AERUGINOSA CULTURE REINCUBATED FOR BETTER GROWTH Performed at [facility], [place] [address], [place], [place] [address]    Report Status PENDING  Incomplete   Organism ID, Bacteria ACINETOBACTER CALCOACETICUS/BAUMANNII COMPLEX (A)  Final      Susceptibility   Acinetobacter calcoaceticus/baumannii complex - MIC*    CEFTAZIDIME >=64 RESISTANT Resistant     CEFTRIAXONE >=64 RESISTANT Resistant     CIPROFLOXACIN >=4 RESISTANT Resistant     GENTAMICIN <=1 SENSITIVE Sensitive     IMIPENEM >=16 RESISTANT Resistant     PIP/TAZO >=128 RESISTANT Resistant     TRIMETH/SULFA <=20 SENSITIVE Sensitive     CEFEPIME >=64 RESISTANT  Resistant     AMPICILLIN/SULBACTAM 4 SENSITIVE Sensitive     * 7,000 COLONIES/mL ACINETOBACTER CALCOACETICUS/BAUMANNII COMPLEX    Alcide Evener, [facility] [phone] pager  12/22/2019, 12:48 PM

## 2019-12-23 ENCOUNTER — Inpatient Hospital Stay (HOSPITAL_COMMUNITY): Payer: No Typology Code available for payment source

## 2019-12-23 LAB — COMPREHENSIVE METABOLIC PANEL
ALT: 104 U/L — ABNORMAL HIGH (ref 0–44)
AST: 100 U/L — ABNORMAL HIGH (ref 15–41)
Albumin: 2 g/dL — ABNORMAL LOW (ref 3.5–5.0)
Alkaline Phosphatase: 52 U/L (ref 38–126)
Anion gap: 12 (ref 5–15)
BUN: 33 mg/dL — ABNORMAL HIGH (ref 6–20)
CO2: 29 mmol/L (ref 22–32)
Calcium: 8.8 mg/dL — ABNORMAL LOW (ref 8.9–10.3)
Chloride: 104 mmol/L (ref 98–111)
Creatinine, Ser: 1.23 mg/dL (ref 0.61–1.24)
GFR calc Af Amer: >60 mL/min (ref >60)
GFR calc non Af Amer: >60 mL/min (ref >60)
Glucose, Bld: 209 mg/dL — ABNORMAL HIGH (ref 70–99)
Potassium: 3.6 mmol/L (ref 3.5–5.1)
Sodium: 145 mmol/L (ref 135–145)
Total Bilirubin: 0.9 mg/dL (ref 0.3–1.2)
Total Protein: 6.4 g/dL — ABNORMAL LOW (ref 6.5–8.1)

## 2019-12-23 LAB — GLUCOSE, CAPILLARY
Glucose-Capillary: 168 mg/dL — ABNORMAL HIGH (ref 70–99)
Glucose-Capillary: 172 mg/dL — ABNORMAL HIGH (ref 70–99)
Glucose-Capillary: 175 mg/dL — ABNORMAL HIGH (ref 70–99)
Glucose-Capillary: 182 mg/dL — ABNORMAL HIGH (ref 70–99)
Glucose-Capillary: 194 mg/dL — ABNORMAL HIGH (ref 70–99)
Glucose-Capillary: 201 mg/dL — ABNORMAL HIGH (ref 70–99)

## 2019-12-23 LAB — CULTURE, BLOOD (ROUTINE X 2)
Culture: NO GROWTH
Culture: NO GROWTH
Special Requests: ADEQUATE
Special Requests: ADEQUATE

## 2019-12-23 LAB — CBC
HCT: 32.9 % — ABNORMAL LOW (ref 39.0–52.0)
Hemoglobin: 9.6 g/dL — ABNORMAL LOW (ref 13.0–17.0)
MCH: 31 pg (ref 26.0–34.0)
MCHC: 29.2 g/dL — ABNORMAL LOW (ref 30.0–36.0)
MCV: 106.1 fL — ABNORMAL HIGH (ref 80.0–100.0)
Platelets: 127 10*3/uL — ABNORMAL LOW (ref 150–400)
RBC: 3.1 MIL/uL — ABNORMAL LOW (ref 4.22–5.81)
RDW: 17.5 % — ABNORMAL HIGH (ref 11.5–15.5)
WBC: 9.7 10*3/uL (ref 4.0–10.5)
nRBC: 0.2 % (ref 0.0–0.2)

## 2019-12-23 LAB — PHOSPHORUS: Phosphorus: 3.4 mg/dL (ref 2.5–4.6)

## 2019-12-23 LAB — MAGNESIUM: Magnesium: 2 mg/dL (ref 1.7–2.4)

## 2019-12-23 MED ORDER — FREE WATER
200.0000 mL | Freq: Four times a day (QID) | Status: DC
  Administered 2019-12-23 – 2019-12-25 (×8): 200 mL

## 2019-12-23 MED ORDER — INSULIN DETEMIR 100 UNIT/ML ~~LOC~~ SOLN
5.0000 [IU] | Freq: Two times a day (BID) | SUBCUTANEOUS | Status: DC
  Administered 2019-12-23 – 2020-01-11 (×40): 5 [IU] via SUBCUTANEOUS
  Filled 2019-12-23 (×44): qty 0.05

## 2019-12-23 NOTE — Evaluation (Signed)
Physical Therapy Evaluation Patient Details Name: Clifford Nichols MRN: [id number] DOB: 1980/09/12 Today's Date: 12/23/2019   History of Present Illness  39 year old obese man with hypertension, diabetes, admitted 12/02/19 to [facility] with acute respiratory failure, ARDS from COVID-19 pneumonia, initial positive test 5/10. Intubated 5/11, required paralytics, proning, pressors, tracheostomy 5/28; Head CT 5/12 no abnormality. Seen by opthalmology due to left eye proptosis (no intervention recommended). Tongue necrosis due to pt biting down when orally intubated  Clinical Impression   Pt admitted with above diagnosis and procedures. 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 He currently has diffuse weakness x 4 extremities. He continues to require ventilator support (on full support during session wit FiO2 50%) and assist to move his extremities. No active movement elicited from bil ankles and toes. Patient very limited in his tolerance for upright sitting/"chair position" in the bed due to large abdomen and feeling he can't breath.  Pt currently with functional limitations due to the deficits listed below (see PT Problem List). Pt will benefit from skilled PT to increase their independence and safety with mobility to allow discharge to the venue listed below.  Anticipate slow progress due to deficits and will likely need post-acute therapies prior to return home.      Follow Up Recommendations CIR(when appropriate/can tolerate)    Equipment Recommendations  Other (comment)(TBA as pt progresses)    Recommendations for Other Services OT consult     Precautions / Restrictions Precautions Precautions: Fall;Other (comment) Precaution Comments: watch RR, HR, sats      Mobility  Bed Mobility               General bed mobility comments: utilized bed function for up to 'chair-like position"  Transfers                 General transfer comment: unable to assess due to  weakness  Ambulation/Gait                Stairs            Wheelchair Mobility    Modified Rankin (Stroke Patients Only)       Balance                                             Pertinent Vitals/Pain Pain Assessment: No/denies pain    Home Living Family/patient expects to be discharged to:: Private residence                 Additional Comments: no family present    Prior Function Level of Independence: Independent         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; 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 [employer]   Dominant Hand: Right    Extremity/Trunk Assessment   Upper Extremity Assessment Upper Extremity Assessment: RUE deficits/detail;LUE deficits/detail RUE Deficits / Details: hands very swollen; able to squeeze weakly and initiate "thumbs up"; AAROM elbow flex/ext; +shoulder shrug LUE Deficits / Details: hands very swollen; able to squeeze weakly and initiate "thumbs up"; AAROM elbow flex/ext; +shoulder shrug    Lower Extremity Assessment Lower Extremity Assessment: RLE deficits/detail;LLE deficits/detail RLE Deficits / Details: PROM toes, ankle WFL (no active movement); pt assists with knee flexion (~15 degrees) and knee extension to 0; hips in external rotation/abduction with PROM to neutral  LLE Deficits / Details: PROM toes, ankle WFL (no  active movement); pt assists with knee flexion (~15 degrees) and knee extension to 0; hips in external rotation/abduction with PROM to 5 degrees external rotation and 10 degrees abduction    Cervical / Trunk Assessment Cervical / Trunk Assessment: Other exceptions Cervical / Trunk Exceptions: morbid obesity with apple shape--this prohibited pt from tolerating full upright "chair" position in bed (HOB up to 55 degrees well tolerated, with attempt to pull torso forward into unsupported sitting pt with incr RR and pt mouthing "can't breathe" and allowed to lie back against the mattress.   Communication    Communication: Tracheostomy;Other (comment)(tongue necrosis/swelling)  Cognition Arousal/Alertness: Awake/alert Behavior During Therapy: WFL for tasks assessed/performed Overall Cognitive Status: Difficult to assess                                 General Comments: difficult to read his lips, cannot write; given choices he knew he was in the hospital, did not know month      General Comments General comments (skin integrity, edema, etc.): Pre-PT HR 122, RR 34, sats 96% on PRVC with FiO2 50%. During session max HR 128, RR 38, sats 92%    Exercises Other Exercises Other Exercises: PROM/AAROM x 4 extremities   Assessment/Plan    PT Assessment Patient needs continued PT services  PT Problem List Decreased strength;Decreased range of motion;Decreased activity tolerance;Decreased balance;Decreased mobility;Decreased cognition;Decreased knowledge of use of DME;Decreased safety awareness;Cardiopulmonary status limiting activity;Obesity       PT Treatment Interventions DME instruction;Gait training;Stair training;Functional mobility training;Therapeutic activities;Therapeutic exercise;Balance training;Neuromuscular re-education;Cognitive remediation;Patient/family education    PT Goals (Current goals can be found in the Care Plan section)  Acute Rehab PT Goals Patient Stated Goal: unable to state; agrees he wants to get stronger and walking PT Goal Formulation: With patient Time For Goal Achievement: 01/06/20 Potential to Achieve Goals: Fair    Frequency Min 4X/week   Barriers to discharge        Co-evaluation               AM-PAC PT "6 Clicks" Mobility  Outcome Measure Help needed turning from your back to your side while in a flat bed without using bedrails?: Total Help needed moving from lying on your back to sitting on the side of a flat bed without using bedrails?: Total Help needed moving to and from a bed to a chair (including a wheelchair)?:  Total Help needed standing up from a chair using your arms (e.g., wheelchair or bedside chair)?: Total Help needed to walk in hospital room?: Total Help needed climbing 3-5 steps with a railing? : Total 6 Click Score: 6    End of Session Equipment Utilized During Treatment: Oxygen Activity Tolerance: Patient limited by fatigue;Treatment limited secondary to medical complications (Comment)(incr RR) Patient left: in bed;with call bell/phone within reach(in chair-like position) Nurse Communication: Mobility status;Need for lift equipment;Other (comment)(considering Kreg bed) PT Visit Diagnosis: Muscle weakness (generalized) (M62.81);Other symptoms and signs involving the nervous system (R29.898);Difficulty in walking, not elsewhere classified (R26.2)    Time: 3149-7026 PT Time Calculation (min) (ACUTE ONLY): 35 min   Charges:   PT Evaluation $PT Eval High Complexity: 1 High PT Treatments $Therapeutic Exercise: 8-22 mins         Jerolyn Center, PT Pager [phone]   Zena Amos 12/23/2019, 2:58 PM

## 2019-12-23 NOTE — Progress Notes (Signed)
Ventilator pt transported from 3M10 to 2M13 without any complications.

## 2019-12-23 NOTE — Progress Notes (Signed)
Assisted tele visit to patient with mother.  Jodine Muchmore P, RN  

## 2019-12-23 NOTE — Progress Notes (Signed)
Assisted tele visit to patient with wife.  Felicia Bloomquist D Ambriel Gorelick, RN   

## 2019-12-23 NOTE — Progress Notes (Signed)
  Speech Language Pathology  Patient Details Name: Clifford Nichols MRN: [id number] DOB: Jun 21, 1981 Today's Date: 12/23/2019 Time:  -     Order received. Reviewed chart and briefly spoke with RN. Pt has trach on ventilator however appears appropriate for inline PMV- worked with PT. Will check tomorrow and coordinate with RT.                    [name] 12/23/2019, 3:17 PM

## 2019-12-23 NOTE — Progress Notes (Signed)
NAME:  Clifford Nichols, MRN:  [id number], DOB:  10/30/1980, LOS: 63 [facility] ADMISSION DATE:  12/04/2019, [facility] Admission DATE:  12/04/19 REFERRING MD:  Dr Mortimer Fries, CCM, CHIEF COMPLAINT:  Acute respiratory failure   Brief History   39 year old obese man with hypertension, diabetes admitted with acute respiratory failure, ARDS from COVID-19 pneumonia, initial positive test 5/10  Past Medical History    has a past medical history of Abscess of upper back excluding scapular region, Diabetes mellitus without complication ([place]), GERD (gastroesophageal reflux disease), Hypertension, and Sebaceous cyst.  Significant Hospital Events   5/10 admitted [facility]  5/11 failed HHFNC, BiPAP.  Required intubation 5/12 transferred to [facility], evaluated by opthal for proptosis- no intervention required 5/17 Started paralytics, ECMO team consulted Not a candidate 5/19 Started proning 5/23 Off pressors 5/26 Proning stopped, continue paralytics 5/27 New tongue wound 2/2 bite block in place, weaned off ketamine. Daily fevers 5/28 Trach, stop paralytics 5/30 CXR improving with diuresis  Consults:  Ophthalmology for proptosis 5/12  Procedures:  ETT 5/11 >> 5/28 L femoral CVC 5/11 >> 5/17 R radial art line 5/17 >> 5/28  Trach 5/28 L IJ CVC 5/17 >>   Significant Diagnostic Tests:  Head CT 5/11 >> no significant intracranial abnormality.  Scattered opacification and air-fluid levels in the frontal, ethmoid, sphenoid and maxillary sinuses of unclear significance.  Head CT 5/12 >> no significant intracranial abnormality  Lower extremity Doppler 5/14 >> negative for DVT  Micro Data:  SARS CoV2 5/10 >> positive MRSA screen 5/12 >> negative Respiratory 5/12 >> negative Blood 5/17 >> Staph epi and Staph hominis Respiratory 5/17 >> Staph epi 5/24 resp cx bal: staph epi 5/24 BAL acid fast: smear negative cx pending 5/24 BAL fungal:pending 5/26 blood: No growth BAL 5/28-Acinetobacter, Proteus, E. coli,  Pseudomonas  Antimicrobials:  Remdesivir 5/11 >> 5/15 Dexamethasone 5/11 >> 5/20 Tocilizumab 5/12 Cefepime 5/17 >> 5/21 Linezolid 5/17 >> 5/23 Zosyn 5/21 >>  5/29  Vanco 5/28 >>  Interim history/subjective:   No acute events overnight.  Remains on the ventilator.  Objective   Blood pressure (!) 142/94, pulse (!) 108, temperature 98.7 F (37.1 C), temperature source Rectal, resp. rate (!) 35, height _0  (1.702 m), weight (!) 179.8 kg, SpO2 94 %.    Vent Mode: PRVC FiO2 (%):  [50 %-100 %] 50 % Set Rate:  [35 bmp] 35 bmp Vt Set:  [470 mL] 470 mL PEEP:  [10 cmH20] 10 cmH20 Plateau Pressure:  [25 cmH20-33 cmH20] 26 cmH20   Intake/Output Summary (Last 24 hours) at 12/23/2019 0737 Last data filed at 12/23/2019 0700 Gross per 24 hour  Intake 5147.74 ml  Output 5450 ml  Net -302.26 ml   Filed Weights   12/21/19 0500 12/22/19 0315 12/23/19 0500  Weight: (!) 187.7 kg (!) 185 kg (!) 179.8 kg    Examination: Gen:      No acute distress, obese HEENT:  EOMI, sclera anicteric Neck:     No masses; no thyromegaly, ETT Lungs:    Clear to auscultation bilaterally; normal respiratory effort CV:         Regular rate and rhythm; no murmurs Abd:      + bowel sounds; soft, non-tender; no palpable masses, no distension Ext:    No edema; adequate peripheral perfusion Skin:      Warm and dry; no rash Neuro: Awake, responsive  Labs significant for glucose 209, BUN/creatinine 33/1.23, WBC 9.7, hemoglobin 9.6 Chest x-ray 5/31-slight worsening of bibasal lung opacities.  Resolved Hospital Problem list    Left proptosis, noted with coughing early 5/12, resolved.  Apparently per wife he has episodic intermittent proptosis at home as well. Head CT 5/12 reassuring Ophthalmology did evaluate, recommended that he will need outpatient evaluation but no intervention at this time beyond keeping the eyes moist.  Assessment & Plan:   ARDS due to COVID-19 pneumonia  Morbid obesity, possible OSA.   Will certainly impact respiratory mechanics Acinetobacter HAP Continue vent support, wean FiO2 Will require higher PEEP due to obesity Off paralytics.   Starting SBT's as tolerated  Acute toxic metabolic encephalopathy, need for sedation Wean sedation. RASS goal to -1 -Continue enteral clonazepam and oxycodone as ordered  Septic shock not present on admission, fever.  - Due to COVID-19 pneumonia.  Staph bacteremia and multiple organisms on recent BAL ID does not feel that the BAL cultures are significant as colony counts are low Recommend continuing Vanco for 2 more days and then stop.  AKI Improving with diuresis  Hypernatremia Continue free water  Thrombocytopenia Anemia: Status post transfusion No overt bleeding.  Follow labs.  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  Best practice:  Diet: TF Pain/Anxiety/Delirium protocol (if indicated):  Fentanyl, midazolam, klonopin and oxycodone per tube VAP protocol (if indicated): per protocol DVT prophylaxis: enoxaparin 0.5 mg/kg qday. Intermediate dosing due to obesity GI prophylaxis: pepcid Glucose control: SSI protocol. Add levemir Mobility: PT consult Code Status: Full Family Communication: Wife updated 5/30, Mom updated 5/29 Disposition: ICU. Can move to 50M when bed is available to allow family visitation  Critical care time:    The patient is critically ill with multiple organ system failure and requires high complexity decision making for assessment and support, frequent evaluation and titration of therapies, advanced monitoring, review of radiographic studies and interpretation of complex data.   Critical Care Time devoted to patient care services, exclusive of separately billable procedures, described in this note is 35 minutes.   Marshell Garfinkel MD [facility] Please see [email or web address] for pager details.  12/23/2019, 7:37 AM

## 2019-12-23 NOTE — Progress Notes (Addendum)
Rehab Admissions Coordinator Note:  Patient was screened by Clois Dupes for appropriateness for an Inpatient Acute Rehab Consult per PT recs. Noted remains vented. His [employer] is non participating. Will need TOC team to clarify what rehab venues are in network to proceed with in network venues when appropriate.  Clois Dupes RN MSN 12/23/2019, 3:19 PM  I can be reached at [phone].

## 2019-12-24 LAB — CBC WITH DIFFERENTIAL/PLATELET
Abs Immature Granulocytes: 0.07 10*3/uL (ref 0.00–0.07)
Basophils Absolute: 0 10*3/uL (ref 0.0–0.1)
Basophils Relative: 0 %
Eosinophils Absolute: 0.3 10*3/uL (ref 0.0–0.5)
Eosinophils Relative: 3 %
HCT: 29.5 % — ABNORMAL LOW (ref 39.0–52.0)
Hemoglobin: 8.3 g/dL — ABNORMAL LOW (ref 13.0–17.0)
Immature Granulocytes: 1 %
Lymphocytes Relative: 14 %
Lymphs Abs: 1.3 10*3/uL (ref 0.7–4.0)
MCH: 30.4 pg (ref 26.0–34.0)
MCHC: 28.1 g/dL — ABNORMAL LOW (ref 30.0–36.0)
MCV: 108.1 fL — ABNORMAL HIGH (ref 80.0–100.0)
Monocytes Absolute: 0.6 10*3/uL (ref 0.1–1.0)
Monocytes Relative: 7 %
Neutro Abs: 6.8 10*3/uL (ref 1.7–7.7)
Neutrophils Relative %: 75 %
Platelets: 141 10*3/uL — ABNORMAL LOW (ref 150–400)
RBC: 2.73 MIL/uL — ABNORMAL LOW (ref 4.22–5.81)
RDW: 17.3 % — ABNORMAL HIGH (ref 11.5–15.5)
WBC: 9.1 10*3/uL (ref 4.0–10.5)
nRBC: 0.4 % — ABNORMAL HIGH (ref 0.0–0.2)

## 2019-12-24 LAB — GLUCOSE, CAPILLARY
Glucose-Capillary: 150 mg/dL — ABNORMAL HIGH (ref 70–99)
Glucose-Capillary: 157 mg/dL — ABNORMAL HIGH (ref 70–99)
Glucose-Capillary: 189 mg/dL — ABNORMAL HIGH (ref 70–99)
Glucose-Capillary: 127 mg/dL — ABNORMAL HIGH (ref 70–99)
Glucose-Capillary: 158 mg/dL — ABNORMAL HIGH (ref 70–99)

## 2019-12-24 LAB — CULTURE, BAL-QUANTITATIVE W GRAM STAIN: Culture: 7000 — AB

## 2019-12-24 LAB — BASIC METABOLIC PANEL
Anion gap: 10 (ref 5–15)
BUN: 48 mg/dL — ABNORMAL HIGH (ref 6–20)
CO2: 30 mmol/L (ref 22–32)
Calcium: 8.8 mg/dL — ABNORMAL LOW (ref 8.9–10.3)
Chloride: 106 mmol/L (ref 98–111)
Creatinine, Ser: 1.83 mg/dL — ABNORMAL HIGH (ref 0.61–1.24)
GFR calc Af Amer: 54 mL/min — ABNORMAL LOW (ref >60)
GFR calc non Af Amer: 46 mL/min — ABNORMAL LOW (ref >60)
Glucose, Bld: 172 mg/dL — ABNORMAL HIGH (ref 70–99)
Potassium: 3.5 mmol/L (ref 3.5–5.1)
Sodium: 146 mmol/L — ABNORMAL HIGH (ref 135–145)

## 2019-12-24 LAB — TRIGLYCERIDES: Triglycerides: 257 mg/dL — ABNORMAL HIGH (ref <150)

## 2019-12-24 LAB — MAGNESIUM: Magnesium: 2.2 mg/dL (ref 1.7–2.4)

## 2019-12-24 LAB — PROCALCITONIN: Procalcitonin: 1.89 ng/mL

## 2019-12-24 MED ORDER — CLONAZEPAM 1 MG PO TABS
2.0000 mg | ORAL_TABLET | Freq: Two times a day (BID) | ORAL | Status: DC
  Administered 2019-12-24 – 2019-12-31 (×16): 2 mg
  Filled 2019-12-24 (×16): qty 2

## 2019-12-24 MED ORDER — GLYCOPYRROLATE 0.2 MG/ML IJ SOLN
0.1000 mg | Freq: Once | INTRAMUSCULAR | Status: AC
  Administered 2019-12-24: 0.1 mg via INTRAVENOUS
  Filled 2019-12-24: qty 1

## 2019-12-24 NOTE — Progress Notes (Signed)
SLP Cancellation Note  Patient Details Name: Clifford Nichols MRN: [id number] DOB: 1981/03/19   Cancelled treatment:        Spoke with RT re: possibility of inline PMV. She reports excessive secretions, changing out HME every few hours. Will check in tomorrow.    [name] 12/24/2019, 11:35 AM   [name] [name].Ed Nurse, children's [phone] Office [phone]

## 2019-12-24 NOTE — Progress Notes (Signed)
Physical Therapy Treatment Patient Details Name: Clifford Nichols MRN: [id number] DOB: 07/31/1980 Today's Date: 12/24/2019    History of Present Illness 39 year old obese man with hypertension, diabetes, admitted 12/02/19 to [facility] with acute respiratory failure, ARDS from COVID-19 pneumonia, initial positive test 5/10. Intubated 5/11, required paralytics, proning, pressors, tracheostomy 5/28; Head CT 5/12 no abnormality. Seen by opthalmology due to left eye proptosis (no intervention recommended). Tongue necrosis due to pt biting down when orally intubated    PT Comments    Pt presently supine on the vent, "spread eagle" on cooling blanket.  He needed significant cuing to get him directed toward task, but lost focus easily.  Pt did follow simple direction with repeated cues.  Deferred sitting EOB in lieu of sitting pt up with the EGRESS function due to relative inability for pt to assist.  Once in sitting, work to get pt forward away from the mattress with significant assist need and no discernible assist from the patient.    Follow Up Recommendations  CIR     Equipment Recommendations  Other (comment)    Recommendations for Other Services       Precautions / Restrictions Precautions Precautions: Fall;Other (comment) Precaution Comments: watch RR, HR, sats    Mobility  Bed Mobility               General bed mobility comments: utilized egress to put in chair position. pt tolerated long sitting with strong production of secretions. RN at bedside to suction secretions at that time. pt nodding Yes that was okay to continue.   Transfers                 General transfer comment: not appropriate at this time  Ambulation/Gait                 Stairs             Wheelchair Mobility    Modified Rankin (Stroke Patients Only)       Balance                                            Cognition Arousal/Alertness: Awake/alert Behavior  During Therapy: WFL for tasks assessed/performed Overall Cognitive Status: Difficult to assess                                 General Comments: pt nodding head at times to indicate a yet. pt following 1 step comamnds      Exercises Other Exercises Other Exercises: AAROM shoulder flexion, extension, elbow flexion / extension, scapula in all planes , wrist flexion extension, AAROM/ PROM digits Other Exercises: aaROM to bil LE in hip/knee flexion/ext with graded resistance of gross extension    General Comments General comments (skin integrity, edema, etc.): HR 130-118 vent PRVC setting RR42- 31 Peep 10 Tidal volume 470 BP 142/80 (97)       Pertinent Vitals/Pain Pain Assessment: No/denies pain    Home Living Family/patient expects to be discharged to:: Inpatient rehab                    Prior Function Level of Independence: Independent      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; per chart working in security   PT Goals (current goals can now be found in the care plan section) Acute  Rehab PT Goals Patient Stated Goal: none stated PT Goal Formulation: With patient Time For Goal Achievement: 01/06/20 Potential to Achieve Goals: Fair Progress towards PT goals: Progressing toward goals(limited)    Frequency    Min 4X/week      PT Plan Current plan remains appropriate    Co-evaluation PT/OT/SLP Co-Evaluation/Treatment: Yes Reason for Co-Treatment: Complexity of the patient's impairments (multi-system involvement) PT goals addressed during session: Mobility/safety with mobility OT goals addressed during session: ADL's and self-care      AM-PAC PT "6 Clicks" Mobility   Outcome Measure  Help needed turning from your back to your side while in a flat bed without using bedrails?: Total Help needed moving from lying on your back to sitting on the side of a flat bed without using bedrails?: Total Help needed moving to and from a bed to a chair (including a wheelchair)?:  Total Help needed standing up from a chair using your arms (e.g., wheelchair or bedside chair)?: Total Help needed to walk in hospital room?: Total Help needed climbing 3-5 steps with a railing? : Total 6 Click Score: 6    End of Session   Activity Tolerance: Patient limited by fatigue;Treatment limited secondary to medical complications (Comment) Patient left: in bed;with call bell/phone within reach Nurse Communication: Mobility status;Need for lift equipment;Other (comment) PT Visit Diagnosis: Muscle weakness (generalized) (M62.81);Other symptoms and signs involving the nervous system (R29.898);Difficulty in walking, not elsewhere classified (R26.2)     Time: 1700-1749 PT Time Calculation (min) (ACUTE ONLY): 28 min  Charges:  $Therapeutic Activity: 8-22 mins                     12/24/2019  Jacinto Halim., PT Acute Rehabilitation Services [phone]  (pager) [phone]  (office)   Eliseo Gum Kean Gautreau 12/24/2019, 5:30 PM

## 2019-12-24 NOTE — Progress Notes (Signed)
4 0eLink Physician-Brief Progress Note Patient Name: Clifford Nichols DOB: 16-Sep-1980 MRN: [id number]   Date of Service  12/24/2019  HPI/Events of Note  Nursing request for AM lab orders.   7eICU Interventions  Plan: 1. CBC with platelets, BMP and Mg++ level In AM.      Intervention Category Major Interventions: Other:  [name] 12/24/2019, 3:43 AM

## 2019-12-24 NOTE — Evaluation (Signed)
Occupational Therapy Evaluation Patient Details Name: Clifford Nichols MRN: [id number] DOB: 1980-12-23 Today's Date: 12/24/2019    History of Present Illness 39 year old obese man with hypertension, diabetes, admitted 12/02/[facility] to [facility] with acute respiratory failure, ARDS from COVID-19 pneumonia, initial positive test 5/10. Intubated 5/11, required paralytics, proning, pressors, tracheostomy 5/28; Head CT 5/12 no abnormality. Seen by opthalmology due to left eye proptosis (no intervention recommended). Tongue necrosis due to pt biting down when orally intubated   Clinical Impression   Pt currently total (A) for all bed level adls and bed mobility. Pt total +2 total to position to top of bed. Pt is showing activation of BIL UE and LE on command. Pt attempting to kick on command this session. Pt could be a candidate for the "Greg Tilt Bed". Pt could benefit from chair egress position x3 per day for at least 30 minutes. Recommend CIR for follow up     Follow Up Recommendations  CIR(to follow for appropriateness)    Equipment Recommendations  Wheelchair cushion (measurements OT);Wheelchair (measurements OT);Hospital bed(bariatric / lift bariatric pad)    Recommendations for Other Services Rehab consult     Precautions / Restrictions Precautions Precautions: Fall;Other (comment) Precaution Comments: watch RR, HR, sats      Mobility Bed Mobility               General bed mobility comments: utilized egress to put in chair position. pt tolerated long sitting with strong production of secretions. RN at bedside to suction secretions at that time. pt nodding Yes that was okay to continue.   Transfers                 General transfer comment: not appropriate at this time    Balance                                           ADL either performed or assessed with clinical judgement   ADL Overall ADL's : Needs assistance/impaired                                        General ADL Comments: total (A) for all adls at this time. pt unable to sustain grasp due to edema in bil hands. pt attempting to move extremities on command and with resistance     Vision         Perception     Praxis      Pertinent Vitals/Pain Pain Assessment: No/denies pain     Hand Dominance Right   Extremity/Trunk Assessment Upper Extremity Assessment Upper Extremity Assessment: RUE deficits/detail;LUE deficits/detail RUE Deficits / Details: AAROM shoulder flexion, bicep activation, tricep activation, wrist activation. Pt with hook grasp with all attempts to squeeze with hands. pt able to scapula elevate with weaker depression. Pt with weak attempts to retract scapula  LUE Deficits / Details: AAROM shoulder flexion, bicep activation, tricep activation, wrist activation. Pt with hook grasp with all attempts to squeeze with hands. pt able to scapula elevate with weaker depression. Pt with weak attempts to retract scapula    Lower Extremity Assessment Lower Extremity Assessment: Defer to PT evaluation RLE Deficits / Details: no activation or ankle or toes this session. pt does demonstrate quad and hamstrings LLE Deficits / Details: no activation of ankle or toes  but demonstrates quad and hamstrings   Cervical / Trunk Assessment Cervical / Trunk Assessment: Other exceptions Cervical / Trunk Exceptions: body habitus with forward positioned head    Communication Communication Communication: Tracheostomy;Other (comment)   Cognition Arousal/Alertness: Awake/alert Behavior During Therapy: WFL for tasks assessed/performed Overall Cognitive Status: Difficult to assess                                 General Comments: pt nodding head at times to indicate a yet. 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; per chart working in security        OT Problem List: Decreased strength;Decreased activity tolerance;Impaired balance (sitting and/or standing);Decreased range of motion;Decreased safety awareness;Decreased knowledge of use of DME or AE;Decreased knowledge of precautions;Obesity;Impaired UE functional use;Cardiopulmonary status limiting activity;Increased edema      OT Treatment/Interventions: Self-care/ADL training;Therapeutic exercise;Neuromuscular education;Energy conservation;DME and/or AE instruction;Manual therapy;Modalities;Splinting;Therapeutic activities;Balance training;Patient/family education    OT Goals(Current goals can be found in the care plan section) Acute Rehab OT Goals Patient Stated Goal: none stated OT Goal Formulation: Patient unable to participate in goal setting Time For Goal Achievement: 01/07/20 Potential to Achieve Goals: Good  OT Frequency: Min 2X/week   Barriers to D/C:            Co-evaluation PT/OT/SLP Co-Evaluation/Treatment: Yes Reason for Co-Treatment: Complexity of the patient's impairments (multi-system involvement);For patient/therapist safety;To address functional/ADL transfers   OT goals addressed during session: ADL's and self-care;Proper use of Adaptive equipment and DME;Strengthening/ROM      AM-PAC OT "6 Clicks" Daily Activity     Outcome Measure Help from another person eating meals?: Total Help from another person taking care of personal grooming?: Total Help from another person toileting, which includes using toliet,  bedpan, or urinal?: Total Help from another person bathing (including washing, rinsing, drying)?: Total Help from another person to put on and taking off regular upper body clothing?: Total Help from another person to put on and taking off regular lower body clothing?: Total 6 Click Score: 6   End of Session Equipment Utilized During Treatment: Oxygen Nurse Communication: Mobility status;Precautions  Activity Tolerance: Patient tolerated treatment well Patient left: in bed;with call bell/phone within reach;with bed alarm set;with nursing/sitter in room(chair position with understanding to sustain 45 minutes)  OT Visit Diagnosis: Unsteadiness on feet (R26.81);Muscle weakness (generalized) (M62.81)                Time: 2423-5361 OT Time Calculation (min): 28 min Charges:  OT General Charges $OT Visit: 1 Visit OT Evaluation $OT Eval Moderate Complexity: 1 Mod   Brynn, OTR/L  Acute Rehabilitation Services Pager: [phone] Office: [phone] .   Mateo Flow 12/24/2019, 4:45 PM

## 2019-12-24 NOTE — Progress Notes (Addendum)
NAME:  Clifford Nichols, MRN:  [id number], DOB:  [birth year], LOS: 39 [facility] ADMISSION DATE:  12/04/2019, [facility] Admission DATE:  12/04/19 REFERRING MD:  Dr Mortimer Fries, CCM, CHIEF COMPLAINT:  Acute respiratory failure   Brief History   39 year old obese man with hypertension, diabetes admitted with acute respiratory failure, ARDS from COVID-19 pneumonia, initial positive test 5/10  Past Medical History    has a past medical history of Abscess of upper back excluding scapular region, Diabetes mellitus without complication ([place]), GERD (gastroesophageal reflux disease), Hypertension, and Sebaceous cyst.  Significant Hospital Events   5/10 admitted [facility]  5/11 failed HHFNC, BiPAP.  Required intubation 5/12 transferred to [facility], evaluated by opthal for proptosis- no intervention required 5/17 Started paralytics, ECMO team consulted Not a candidate 5/19 Started proning 5/23 Off pressors 5/26 Proning stopped, continue paralytics 5/27 New tongue wound 2/2 bite block in place, weaned off ketamine. Daily fevers 5/28 Trach, stop paralytics 5/30 CXR improving with diuresis  Consults:  Ophthalmology for proptosis 5/12  Procedures:  ETT 5/11 >> 5/28 L femoral CVC 5/11 >> 5/17 R radial art line 5/17 >> 5/28  Trach 5/28 L IJ CVC 5/17 >>   Significant Diagnostic Tests:  Head CT 5/11 >> no significant intracranial abnormality.  Scattered opacification and air-fluid levels in the frontal, ethmoid, sphenoid and maxillary sinuses of unclear significance.  Head CT 5/12 >> no significant intracranial abnormality  Lower extremity Doppler 5/14 >> negative for DVT  Micro Data:  SARS CoV2 5/10 >> positive MRSA screen 5/12 >> negative Respiratory 5/12 >> negative Blood 5/17 >> Staph epi and Staph hominis Respiratory 5/17 >> Staph epi 5/24 resp cx bal: staph epi 5/24 BAL acid fast: smear negative cx pending 5/24 BAL fungal:pending 5/26 blood: No growth BAL 5/28-Acinetobacter, Proteus, Pseudomonas,  Enterococcus  Antimicrobials:  Remdesivir 5/11 >> 5/15 Dexamethasone 5/11 >> 5/20 Tocilizumab 5/12 Cefepime 5/17 >> 5/21 Linezolid 5/17 >> 5/23 Zosyn 5/21 >>  5/29  Vanco 5/28 >>  Interim history/subjective:   No acute events.  Moved out of airborne isolation Remains on the ventilator and sedated on Versed drip. Continues to have thick secretions.  Objective   Blood pressure 106/68, pulse (!) 110, temperature 99 F (37.2 C), temperature source Oral, resp. rate (!) 35, height _0  (1.702 m), weight (!) 181.6 kg, SpO2 94 %.    Vent Mode: PRVC FiO2 (%):  [50 %] 50 % Set Rate:  [35 bmp] 35 bmp Vt Set:  [470 mL] 470 mL PEEP:  [10 cmH20] 10 cmH20 Pressure Support:  [16 cmH20] 16 cmH20 Plateau Pressure:  [20 cmH20-25 cmH20] 25 cmH20   Intake/Output Summary (Last 24 hours) at 12/24/2019 0748 Last data filed at 12/24/2019 0600 Gross per 24 hour  Intake 2684.2 ml  Output 3225 ml  Net -540.8 ml   Filed Weights   12/22/19 0315 12/23/19 0500 12/24/19 0410  Weight: (!) 185 kg (!) 179.8 kg (!) 181.6 kg    Examination: Blood pressure 106/68, pulse (!) 110, temperature 99 F (37.2 C), temperature source Oral, resp. rate (!) 35, height _1  (1.702 m), weight (!) 181.6 kg, SpO2 94 %. Gen:      No acute distress HEENT:  EOMI, sclera anicteric Neck:     No masses; no thyromegaly, trach Lungs:    Clear to auscultation bilaterally; normal respiratory effort CV:         Regular rate and rhythm; no murmurs Abd:      + bowel sounds; soft, non-tender; no palpable  masses, no distension Ext:    No edema; adequate peripheral perfusion Skin:      Warm and dry; no rash Neuro: Sedated, arousable  Labs significant for BUN/creatinine 48/1.83 WBC 9.1, hemoglobin 8.3, platelets 141 No new imaging.  Resolved Hospital Problem list    Left proptosis, noted with coughing early 5/12, resolved.  Apparently per wife he has episodic intermittent proptosis at home as well. Head CT 5/12  reassuring Ophthalmology did evaluate, recommended that he will need outpatient evaluation but no intervention at this time beyond keeping the eyes moist.  Assessment & Plan:   ARDS due to COVID-19 pneumonia  Morbid obesity, possible OSA.  Will certainly impact respiratory mechanics Acinetobacter HAP Continue vent support, wean FiO2 Will require higher PEEP due to obesity Off paralytics.   SBT's as tolerated.  Acute toxic metabolic encephalopathy, need for sedation Wean sedation. RASS goal to -1 Continue enteral clonazepam and oxycodone as ordered Increase clonazepam to wean benzos  Septic shock not present on admission, fever.  - Due to COVID-19 pneumonia.  Staph bacteremia and multiple organisms on recent BAL ID does not feel that the BAL cultures are significant as colony counts are low Recommend continuing Vanco for 2 more days and then stop. Will check Pct and repeat trach cultures. Peripheral IV and remove CVL  AKI Worse today. Hold lasix  Hypernatremia Continue free water  Thrombocytopenia Anemia: Status post transfusion No overt bleeding.  Follow labs.  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  Best practice:  Diet: TF Pain/Anxiety/Delirium protocol (if indicated):  Fentanyl, midazolam, klonopin and oxycodone per tube VAP protocol (if indicated): per protocol DVT prophylaxis: enoxaparin 0.5 mg/kg qday. Intermediate dosing due to obesity GI prophylaxis: pepcid Glucose control: SSI protocol. Levemir Mobility: PT consult Code Status: Full Family Communication: Wife updated 6/1 Disposition: ICU.   Critical care time:    The patient is critically ill with multiple organ system failure and requires high complexity decision making for assessment and support, frequent evaluation and titration of therapies, advanced  monitoring, review of radiographic studies and interpretation of complex data.   Critical Care Time devoted to patient care services, exclusive of separately billable procedures, described in this note is 35 minutes.   Marshell Garfinkel MD [facility] Please see [email or web address] for pager details.  12/24/2019, 7:48 AM

## 2019-12-24 NOTE — Progress Notes (Signed)
Assisted tele visit to patient with wife.  Jadelyn Elks Anderson, RN   

## 2019-12-24 NOTE — Progress Notes (Signed)
Assisted tele visit to patient with wife.  Oletta Buehring D Godfrey Tritschler, RN   

## 2019-12-25 ENCOUNTER — Inpatient Hospital Stay (HOSPITAL_COMMUNITY): Payer: No Typology Code available for payment source

## 2019-12-25 LAB — MAGNESIUM: Magnesium: 2.1 mg/dL (ref 1.7–2.4)

## 2019-12-25 LAB — GLUCOSE, CAPILLARY
Glucose-Capillary: 135 mg/dL — ABNORMAL HIGH (ref 70–99)
Glucose-Capillary: 145 mg/dL — ABNORMAL HIGH (ref 70–99)
Glucose-Capillary: 146 mg/dL — ABNORMAL HIGH (ref 70–99)
Glucose-Capillary: 156 mg/dL — ABNORMAL HIGH (ref 70–99)
Glucose-Capillary: 162 mg/dL — ABNORMAL HIGH (ref 70–99)
Glucose-Capillary: 170 mg/dL — ABNORMAL HIGH (ref 70–99)
Glucose-Capillary: 179 mg/dL — ABNORMAL HIGH (ref 70–99)

## 2019-12-25 LAB — BASIC METABOLIC PANEL
Anion gap: 11 (ref 5–15)
BUN: 50 mg/dL — ABNORMAL HIGH (ref 6–20)
CO2: 30 mmol/L (ref 22–32)
Calcium: 9.1 mg/dL (ref 8.9–10.3)
Chloride: 108 mmol/L (ref 98–111)
Creatinine, Ser: 1.65 mg/dL — ABNORMAL HIGH (ref 0.61–1.24)
GFR calc Af Amer: >60 mL/min (ref >60)
GFR calc non Af Amer: 53 mL/min — ABNORMAL LOW (ref >60)
Glucose, Bld: 178 mg/dL — ABNORMAL HIGH (ref 70–99)
Potassium: 3.4 mmol/L — ABNORMAL LOW (ref 3.5–5.1)
Sodium: 149 mmol/L — ABNORMAL HIGH (ref 135–145)

## 2019-12-25 LAB — CBC
HCT: 29.4 % — ABNORMAL LOW (ref 39.0–52.0)
Hemoglobin: 8.2 g/dL — ABNORMAL LOW (ref 13.0–17.0)
MCH: 30.4 pg (ref 26.0–34.0)
MCHC: 27.9 g/dL — ABNORMAL LOW (ref 30.0–36.0)
MCV: 108.9 fL — ABNORMAL HIGH (ref 80.0–100.0)
Platelets: 148 10*3/uL — ABNORMAL LOW (ref 150–400)
RBC: 2.7 MIL/uL — ABNORMAL LOW (ref 4.22–5.81)
RDW: 17.2 % — ABNORMAL HIGH (ref 11.5–15.5)
WBC: 8.2 10*3/uL (ref 4.0–10.5)
nRBC: 0 % (ref 0.0–0.2)

## 2019-12-25 LAB — PHOSPHORUS: Phosphorus: 4.4 mg/dL (ref 2.5–4.6)

## 2019-12-25 LAB — PROCALCITONIN: Procalcitonin: 1.05 ng/mL

## 2019-12-25 MED ORDER — FENTANYL CITRATE (PF) 100 MCG/2ML IJ SOLN
25.0000 ug | INTRAMUSCULAR | Status: DC | PRN
  Administered 2019-12-29 – 2020-01-05 (×5): 50 ug via INTRAVENOUS
  Filled 2019-12-25 (×5): qty 2

## 2019-12-25 MED ORDER — QUETIAPINE FUMARATE 50 MG PO TABS
50.0000 mg | ORAL_TABLET | Freq: Two times a day (BID) | ORAL | Status: DC
  Administered 2019-12-25 – 2020-01-07 (×28): 50 mg
  Filled 2019-12-25 (×28): qty 1

## 2019-12-25 MED ORDER — FUROSEMIDE 10 MG/ML IJ SOLN
40.0000 mg | Freq: Every day | INTRAMUSCULAR | Status: DC
  Administered 2019-12-25 – 2020-01-07 (×14): 40 mg via INTRAVENOUS
  Filled 2019-12-25 (×14): qty 4

## 2019-12-25 MED ORDER — FREE WATER
300.0000 mL | Freq: Four times a day (QID) | Status: DC
  Administered 2019-12-25 – 2019-12-27 (×7): 300 mL

## 2019-12-25 MED ORDER — NALOXONE HCL 0.4 MG/ML IJ SOLN
INTRAMUSCULAR | Status: AC
  Filled 2019-12-25: qty 1

## 2019-12-25 MED ORDER — POTASSIUM CHLORIDE 20 MEQ/15ML (10%) PO SOLN
40.0000 meq | Freq: Once | ORAL | Status: AC
  Administered 2019-12-25: 40 meq
  Filled 2019-12-25: qty 30

## 2019-12-25 NOTE — Evaluation (Signed)
[facility] Speaking Valve - Evaluation Patient Details  Name: Clifford Nichols MRN: [id number] Date of Birth: 1981-01-04  Today's Date: 12/25/2019 Time: 1200-1221 SLP Time Calculation (min) (ACUTE ONLY): 21 min  Past Medical History:  Past Medical History:  Diagnosis Date  . Abscess of upper back excluding scapular region   . Diabetes mellitus without complication (HCC)   . GERD (gastroesophageal reflux disease)    OCC-NO MEDS  . Hypertension   . Sebaceous cyst    Past Surgical History:  Past Surgical History:  Procedure Laterality Date  . IRRIGATION AND DEBRIDEMENT SEBACEOUS CYST Right 07/11/2018   Procedure: EXCISION  SEBACEOUS CYST POSTERIOR SHOULDER AND MID-BACK;  Surgeon: Ancil Linsey, MD;  Location: [facility] ORS;  Service: General;  Laterality: Right;  . NO PAST SURGERIES     HPI:  39 year old obese man with hypertension, diabetes, admitted 12/02/19 to [facility] with acute respiratory failure, ARDS from COVID-19 pneumonia, initial positive test 5/10. Intubated 5/11, required paralytics, proning, pressors, tracheostomy 5/28; Head CT 5/12 no abnormality. Tongue necrosis due to pt biting down when orally intubated   Assessment / Plan / Recommendation Clinical Impression  PMV inline with ventilator assessment completed with RT suctioning, deflating cuff, and lowered PEEP to zero. Pt has significant secretions at baseline which entered oral cavity and suctioned after cuff delation. Respirations were significantly congested and respiratory effort inefficient for an effective cough/throat clear. Attempts at phonation were interrupted by mucous and vocalization audible on 2 attempts. Pt with weak oral movements during mouthing of words. RR 28-40 and HR, SpO2 stable. He was positioned upright in chair position following PT and mom at bedside. PMV use with ST only and continued efforts to establish glottal clearance, supportive respirations and intelligibility.       SLP Visit Diagnosis: Aphonia  (R49.1)    SLP Assessment  Patient needs continued Speech Lanaguage Pathology Services    Follow Up Recommendations  24 hour supervision/assistance(venue TBD)    Frequency and Duration min 2x/week  2 weeks    PMSV Trial PMSV was placed for: 6-8 m in Able to redirect subglottic air through upper airway: Yes Able to Attain Phonation: Yes(x 2) Voice Quality: Wet;Low vocal intensity;Hoarse Able to Expectorate Secretions: No Breath Support for Phonation: Moderately decreased Intelligibility: Intelligibility reduced Word: 25-49% accurate Respirations During Trial: (25-40) SpO2 During Trial: 97 % Pulse During Trial: 105 Behavior: Cooperative(drowsy)   Tracheostomy Tube       Vent Dependency  Vent Mode: PRVC Set Rate: 35 bmp PEEP: 10 cmH20 FiO2 (%): 60 % Vt Set: 470 mL    Cuff Deflation Trial  GO Tolerated Cuff Deflation: Yes Length of Time for Cuff Deflation Trial: 12 min Behavior: Cooperative(drowsy)        [name] 12/25/2019, 1:36 PM  [name] Levaeh Vice M.Ed Nurse, children's [phone] Office [phone]

## 2019-12-25 NOTE — Progress Notes (Signed)
Physical Therapy Treatment Patient Details Name: Clifford Nichols MRN: [id number] DOB: 1981-07-24 Today's Date: 12/25/2019    History of Present Illness 39 year old obese man with hypertension, diabetes, admitted 12/02/19 to [facility] with acute respiratory failure, ARDS from COVID-19 pneumonia, initial positive test 5/10. Intubated 5/11, required paralytics, proning, pressors, tracheostomy 5/28; Head CT 5/12 no abnormality. Seen by opthalmology due to left eye proptosis (no intervention recommended). Tongue necrosis due to pt biting down when orally intubated    PT Comments    Pt a little less sedated today.  He was slow to respond and not consistent, but did follow some direction.  Emphasis on sitting EOB and working on truncal reactions.    Follow Up Recommendations  CIR     Equipment Recommendations  Other (comment)(TBA)    Recommendations for Other Services       Precautions / Restrictions Precautions Precautions: Fall Precaution Comments: watch RR, HR, sats    Mobility  Bed Mobility Overal bed mobility: Needs Assistance Bed Mobility: Rolling;Supine to Sit;Sit to Supine Rolling: Total assist;+2 for physical assistance   Supine to sit: Total assist;+2 for physical assistance Sit to supine: Total assist;+2 for physical assistance(+3)   General bed mobility comments: moved bil LE off the bed on the right, use of pad to assist scooting bottom.  pt assisted up and forward via R elbow and squared up with total assist  Transfers                 General transfer comment: not appropriate at this time  Ambulation/Gait                 Stairs             Wheelchair Mobility    Modified Rankin (Stroke Patients Only)       Balance Overall balance assessment: Needs assistance Sitting-balance support: Single extremity supported;Bilateral upper extremity supported;Feet supported Sitting balance-Leahy Scale: Zero Sitting balance - Comments: minimal attempts  by pt to assist with sitting which faded with fatigue.  Significant posterior and L Lean. Sat EOB for 9-10 min until it was clear pt was done.                                    Cognition Arousal/Alertness: Awake/alert Behavior During Therapy: WFL for tasks assessed/performed Overall Cognitive Status: Impaired/Different from baseline Area of Impairment: Attention;Following commands;Awareness                   Current Attention Level: Focused;Sustained   Following Commands: Follows one step commands inconsistently;Follows one step commands with increased time   Awareness: Intellectual          Exercises Other Exercises Other Exercises: aaROM to bil LE in hip/knee flexion/ext with graded resistance of gross extension    General Comments        Pertinent Vitals/Pain Pain Assessment: Faces Faces Pain Scale: Hurts a little bit Pain Location: general  Pain Descriptors / Indicators: Grimacing Pain Intervention(s): Monitored during session;Repositioned;Limited activity within patient's tolerance    Home Living                      Prior Function            PT Goals (current goals can now be found in the care plan section) Acute Rehab PT Goals PT Goal Formulation: With patient Time For Goal Achievement: 01/06/20 Potential to  Achieve Goals: Fair Progress towards PT goals: Progressing toward goals    Frequency    Min 4X/week      PT Plan Current plan remains appropriate    Co-evaluation              AM-PAC PT "6 Clicks" Mobility   Outcome Measure  Help needed turning from your back to your side while in a flat bed without using bedrails?: Total Help needed moving from lying on your back to sitting on the side of a flat bed without using bedrails?: Total Help needed moving to and from a bed to a chair (including a wheelchair)?: Total Help needed standing up from a chair using your arms (e.g., wheelchair or bedside chair)?:  Total Help needed to walk in hospital room?: Total Help needed climbing 3-5 steps with a railing? : Total 6 Click Score: 6    End of Session Equipment Utilized During Treatment: Oxygen Activity Tolerance: Patient limited by fatigue Patient left: in bed;with SCD's reapplied;with bed alarm set(in a upright chair position) Nurse Communication: Mobility status;Need for lift equipment;Other (comment)(may benefit from a KREG bed at some point.) PT Visit Diagnosis: Muscle weakness (generalized) (M62.81);Other symptoms and signs involving the nervous system (R29.898);Difficulty in walking, not elsewhere classified (R26.2)     Time: 2119-4174 PT Time Calculation (min) (ACUTE ONLY): 27 min  Charges:  $Therapeutic Activity: 23-37 mins                     12/25/2019  Jacinto Halim., PT Acute Rehabilitation Services [phone]  (pager) [phone]  (office)   Eliseo Gum Darius Lundberg 12/25/2019, 12:06 PM

## 2019-12-25 NOTE — Progress Notes (Signed)
Nutrition Follow-up  DOCUMENTATION CODES:   Morbid obesity  INTERVENTION:   Continue Tube Feeding via Post-Pyloric Cortrak tube: Vital 1.5 @ 50 ml/hr (1200 ml/day)  60 ml Prostat TID Provides: 2400 kcal, 171 grams protein, and 917 ml free water daily.  Total free water with TF plus free water flush of 300 mL every 6 hours: 2112 mL.   Given hypernatremia, recommend change free water flushes to 250 ml every 4 hours for a total of 2417 ml free water per day.   NUTRITION DIAGNOSIS:   Increased nutrient needs related to acute illness(COVID-19 PNA) as evidenced by estimated needs.  Ongoing  GOAL:   Provide needs based on ASPEN/SCCM guidelines  Met with TF  MONITOR:   Vent status, TF tolerance  REASON FOR ASSESSMENT:   Consult, Ventilator Enteral/tube feeding initiation and management  ASSESSMENT:   Pt with PMH of HTN, DM, and morbid obesity admitted with acute respiratory failure/ARDS from COVID-19 PNA.  5/10 admit to [facility] 5/11 required urgent intubation 5/12 tx to [facility] 5/12; ophthalmology for proptosis. Per notes RN having to push eye back into orbit therefore unable to prone pt. 5/19 Started proning 2/20 Pt vomiting, TF coming out of mouth, TF on hold 5/21 Post-pyloric Cortrak placed 5/28 trach placed 5/31 transferred to 48M from 63M, no longer requiring COVID restrictions  Discussed patient in ICU rounds and with RN today. Patient is no longer requiring proning. Unable to wean on ventilator today. Sedation has been turned off.   SLP working with patient on PMV use.   Currently receiving Vital 1.5 at 50 ml/h with Pro-stat 60 ml TID to provide 2400 kcal, 171 gm protein, 917 ml free water daily.  Patient remains intubated on ventilator support via trach. MV: 16.4 L/min Temp (24hrs), Avg:99.5 F (37.5 C), Min:98.1 F (36.7 C), Max:101.1 F (38.4 C)  Hypernatremia ongoing, receiving free water 300 ml every 6 hours.  Labs:  Sodium 149 (H), potassium 3.4  (L) CBGs: 470 461 4302 Medications reviewed and include vitamin C, Lasix, ss novolog, levemir, zinc sulfate.  I/O + 16.7 L since admission.  Diet Order:   Diet Order            Diet NPO time specified  Diet effective now              EDUCATION NEEDS:   No education needs have been identified at this time  Skin:  Skin Assessment: Skin Integrity Issues: Skin Integrity Issues:: DTI, Other (Comment) DTI: L side of tongue Other: boil to lower, right breast  Last BM:  5/31 type 7 Rectal tube  Height:   Ht Readings from Last 1 Encounters:  12/14/19 _0  (1.702 m)    Weight:   Wt Readings from Last 1 Encounters:  12/25/19 (!) 179.1 kg    Ideal Body Weight:  67.2 kg  BMI:  Body mass index is 61.84 kg/m.  Estimated Nutritional Needs:   Kcal:  2229-7989  Protein:  140-175 grams  Fluid:  2.4 L   Lucas Mallow, RD, LDN, CNSC Please refer to Amion for contact information.

## 2019-12-25 NOTE — Progress Notes (Signed)
eLink Physician-Brief Progress Note Patient Name: Clifford Nichols DOB: 1981/04/19 MRN: [id number]   Date of Service  12/25/2019  HPI/Events of Note  Agitation - Fentanyl IV infusion D/Ced today.   eICU Interventions  Plan: 1. D/C Fentanyl IV infusion and Fentanyl bolus from infusion orders.  2. Fentanyl 25-50 mcg IV Q 2 hours PRN pain or sedation.      Intervention Category Major Interventions: Delirium, psychosis, severe agitation - evaluation and management  [name] 12/25/2019, 9:34 PM

## 2019-12-25 NOTE — Progress Notes (Signed)
Assisted tele visit to patient with wife, Telida.  Clifford Bradly M Briggette Najarian, RN   

## 2019-12-25 NOTE — Progress Notes (Signed)
Assisted tele visit to patient with wife.  Kaylene Dawn D Kseniya Grunden, RN   

## 2019-12-25 NOTE — Progress Notes (Signed)
K+ 3.4 Replaced per protocol  

## 2019-12-25 NOTE — Progress Notes (Signed)
NAME:  Clifford Nichols, MRN:  [id number], DOB:  1981/03/21, LOS: 80 [facility] ADMISSION DATE:  12/04/2019, [facility] Admission DATE:  12/04/19 REFERRING MD:  Dr Mortimer Fries, CCM, CHIEF COMPLAINT:  Acute respiratory failure   Brief History   39 year old obese man with hypertension, diabetes admitted with acute respiratory failure, ARDS from COVID-19 pneumonia, initial positive test 5/10  Past Medical History    has a past medical history of Abscess of upper back excluding scapular region, Diabetes mellitus without complication ([place]), GERD (gastroesophageal reflux disease), Hypertension, and Sebaceous cyst.  Significant Hospital Events   5/10 admitted [facility]  5/11 failed HHFNC, BiPAP.  Required intubation 5/12 transferred to [facility], evaluated by opthal for proptosis- no intervention required 5/17 Started paralytics, ECMO team consulted Not a candidate 5/19 Started proning 5/23 Off pressors 5/26 Proning stopped, continue paralytics 5/27 New tongue wound 2/2 bite block in place, weaned off ketamine. Daily fevers 5/28 Trach, stop paralytics 6/1 Out of airborne isolation, family visiting, on PSV weans  Consults:  Ophthalmology for proptosis 5/12  Procedures:  ETT 5/11 >> 5/28 L femoral CVC 5/11 >> 5/17 R radial art line 5/17 >> 5/28 L IJ CVC 5/17 >> 6/1  Trach 5/28  Significant Diagnostic Tests:  Head CT 5/11 >> no significant intracranial abnormality.  Scattered opacification and air-fluid levels in the frontal, ethmoid, sphenoid and maxillary sinuses of unclear significance.  Head CT 5/12 >> no significant intracranial abnormality  Lower extremity Doppler 5/14 >> negative for DVT  Micro Data:  SARS CoV2 5/10 >> positive MRSA screen 5/12 >> negative Respiratory 5/12 >> negative Blood 5/17 >> Staph epi and Staph hominis Respiratory 5/17 >> Staph epi 5/24 resp cx bal: staph epi 5/24 BAL acid fast: smear negative cx pending 5/24 BAL fungal:pending 5/26 blood: No growth BAL 5/28-Acinetobacter,  Proteus, Pseudomonas, Enterococcus  Antimicrobials:  Remdesivir 5/11 >> 5/15 Dexamethasone 5/11 >> 5/20 Tocilizumab 5/12 Cefepime 5/17 >> 5/21 Linezolid 5/17 >> 5/23 Zosyn 5/21 >>  5/29 Vanco 5/28 >> 6/1  Interim history/subjective:   No acute events.  Starting pressure support weans Continues to have thick secretions.  Afebrile today but on a cooling blanket  Objective   Blood pressure (!) 141/74, pulse 99, temperature 99.1 F (37.3 C), resp. rate (!) 35, height _0  (1.702 m), weight (!) 179.1 kg, SpO2 95 %.    Vent Mode: PRVC FiO2 (%):  [40 %-100 %] 60 % Set Rate:  [35 bmp] 35 bmp Vt Set:  [470 mL] 470 mL PEEP:  [10 cmH20] 10 cmH20 Pressure Support:  [14 cmH20] 14 cmH20 Plateau Pressure:  [18 cmH20-26 cmH20] 18 cmH20   Intake/Output Summary (Last 24 hours) at 12/25/2019 0807 Last data filed at 12/25/2019 0735 Gross per 24 hour  Intake 3394.89 ml  Output 4475 ml  Net -1080.11 ml   Filed Weights   12/23/19 0500 12/24/19 0410 12/25/19 0332  Weight: (!) 179.8 kg (!) 181.6 kg (!) 179.1 kg    Examination: Gen:      No acute distress, obese HEENT:  EOMI, sclera anicteric Neck:     No masses; no thyromegaly, trach Lungs:    Clear to auscultation bilaterally; normal respiratory effort CV:         Regular rate and rhythm; no murmurs Abd:      + bowel sounds; soft, non-tender; no palpable masses, no distension Ext:    No edema; adequate peripheral perfusion Skin:      Warm and dry; no rash Neuro: Sedated, arousable  Labs  significant for Potassium 3.4, BUN/creatinine 50/1.65 WBC 8.2, hemoglobin 8.2, platelets 148   Resolved Hospital Problem list    Left proptosis, noted with coughing early 5/12, resolved.  Apparently per wife he has episodic intermittent proptosis at home as well. Head CT 5/12 reassuring Ophthalmology did evaluate, recommended that he will need outpatient evaluation but no intervention at this time beyond keeping the eyes moist.  Assessment & Plan:     ARDS due to COVID-19 pneumonia  Morbid obesity, possible OSA.  Will certainly impact respiratory mechanics Acinetobacter HAP Continue vent support, wean FiO2 Will require higher PEEP due to obesity SBT's as tolerated.  Acute toxic metabolic encephalopathy, need for sedation Wean sedation. RASS goal to -1 Continue enteral clonazepam and oxycodone as ordered Increase clonazepam to wean benzos Add seroquel  Septic shock not present on admission, fever.  - Due to COVID-19 pneumonia.  Staph bacteremia and multiple organisms on recent BAL ID does not feel that the BAL cultures are significant as colony counts are low Line holiday  Observe off antibiotics  AKI Resume lasix 40 mg/day to keep I/O even  Hypernatremia Continue free water. Increase dose.   Thrombocytopenia Anemia: Status post transfusion No overt bleeding.  Follow labs.  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   Best practice:  Diet: TF Pain/Anxiety/Delirium protocol (if indicated):  Fentanyl, midazolam, klonopin and oxycodone per tube VAP protocol (if indicated): per protocol DVT prophylaxis: enoxaparin 0.5 mg/kg qday. Intermediate dosing due to obesity GI prophylaxis: pepcid Glucose control: SSI protocol. Levemir Mobility: PT consult Code Status: Full Family Communication: Wife updated 6/1 Disposition: ICU.   Critical care time:    The patient is critically ill with multiple organ system failure and requires high complexity decision making for assessment and support, frequent evaluation and titration of therapies, advanced monitoring, review of radiographic studies and interpretation of complex data.   Critical Care Time devoted to patient care services, exclusive of separately billable procedures, described in this note is 35 minutes.   Marshell Garfinkel MD [facility] Please  see [email or web address] for pager details.  12/25/2019, 8:07 AM

## 2019-12-26 ENCOUNTER — Inpatient Hospital Stay (HOSPITAL_COMMUNITY): Payer: No Typology Code available for payment source

## 2019-12-26 LAB — GLUCOSE, CAPILLARY
Glucose-Capillary: 136 mg/dL — ABNORMAL HIGH (ref 70–99)
Glucose-Capillary: 148 mg/dL — ABNORMAL HIGH (ref 70–99)
Glucose-Capillary: 163 mg/dL — ABNORMAL HIGH (ref 70–99)
Glucose-Capillary: 151 mg/dL — ABNORMAL HIGH (ref 70–99)
Glucose-Capillary: 126 mg/dL — ABNORMAL HIGH (ref 70–99)
Glucose-Capillary: 127 mg/dL — ABNORMAL HIGH (ref 70–99)

## 2019-12-26 LAB — BASIC METABOLIC PANEL
Anion gap: 11 (ref 5–15)
BUN: 45 mg/dL — ABNORMAL HIGH (ref 6–20)
CO2: 29 mmol/L (ref 22–32)
Calcium: 9.1 mg/dL (ref 8.9–10.3)
Chloride: 108 mmol/L (ref 98–111)
Creatinine, Ser: 1.46 mg/dL — ABNORMAL HIGH (ref 0.61–1.24)
GFR calc Af Amer: >60 mL/min (ref >60)
GFR calc non Af Amer: >60 mL/min (ref >60)
Glucose, Bld: 144 mg/dL — ABNORMAL HIGH (ref 70–99)
Potassium: 3.5 mmol/L (ref 3.5–5.1)
Sodium: 148 mmol/L — ABNORMAL HIGH (ref 135–145)

## 2019-12-26 LAB — CULTURE, RESPIRATORY W GRAM STAIN: Special Requests: NORMAL

## 2019-12-26 LAB — CBC
HCT: 27.7 % — ABNORMAL LOW (ref 39.0–52.0)
Hemoglobin: 7.8 g/dL — ABNORMAL LOW (ref 13.0–17.0)
MCH: 30.4 pg (ref 26.0–34.0)
MCHC: 28.2 g/dL — ABNORMAL LOW (ref 30.0–36.0)
MCV: 107.8 fL — ABNORMAL HIGH (ref 80.0–100.0)
Platelets: 156 10*3/uL (ref 150–400)
RBC: 2.57 MIL/uL — ABNORMAL LOW (ref 4.22–5.81)
RDW: 17.1 % — ABNORMAL HIGH (ref 11.5–15.5)
WBC: 7.8 10*3/uL (ref 4.0–10.5)
nRBC: 0.3 % — ABNORMAL HIGH (ref 0.0–0.2)

## 2019-12-26 LAB — PROCALCITONIN: Procalcitonin: 0.7 ng/mL

## 2019-12-26 LAB — PHOSPHORUS: Phosphorus: 4.3 mg/dL (ref 2.5–4.6)

## 2019-12-26 LAB — MAGNESIUM: Magnesium: 2 mg/dL (ref 1.7–2.4)

## 2019-12-26 NOTE — Progress Notes (Signed)
Physical Therapy Treatment Patient Details Name: Clifford Nichols MRN: [id number] DOB: [birth year] Today's Date: 12/26/2019    History of Present Illness 39 year old obese man with hypertension, diabetes, admitted 12/02/19 to [facility] with acute respiratory failure, ARDS from COVID-19 pneumonia, initial positive test 5/10. Intubated 5/11, required paralytics, proning, pressors, tracheostomy 5/28; Head CT 5/12 no abnormality. Seen by opthalmology due to left eye proptosis (no intervention recommended). Tongue necrosis due to pt biting down when orally intubated    PT Comments    Pt was alert, agreeable.  Worked on transitions and sitting balance at EOB.  Pt appears to have the tolerance sitting up to try OOB to chair next session.    Follow Up Recommendations  CIR     Equipment Recommendations  Other (comment)    Recommendations for Other Services       Precautions / Restrictions Precautions Precautions: Fall Precaution Comments: watch RR, HR, sats Restrictions Weight Bearing Restrictions: No    Mobility  Bed Mobility Overal bed mobility: Needs Assistance Bed Mobility: Rolling;Sidelying to Sit;Sit to Sidelying Rolling: Max assist;+2 for physical assistance Sidelying to sit: Total assist;+2 for physical assistance   Sit to supine: Total assist;+2 for physical assistance(+3)      Transfers                    Ambulation/Gait                 Stairs             Wheelchair Mobility    Modified Rankin (Stroke Patients Only)       Balance Overall balance assessment: Needs assistance Sitting-balance support: Bilateral upper extremity supported;Single extremity supported;Feet supported Sitting balance-Leahy Scale: Poor Sitting balance - Comments: pt sat EOB x 12 min.  Initially needed forward assist, but started being able to stay forward without assist and with UE assist/hold.                                    Cognition  Arousal/Alertness: Awake/alert Behavior During Therapy: WFL for tasks assessed/performed Overall Cognitive Status: (NT formally today, nodded approp to [employer])                         Following Commands: Follows one step commands with increased time   Awareness: Intellectual          Exercises Other Exercises Other Exercises: aaROM to bil LE in hip/knee flexion/ext with graded resistance of gross extension    General Comments General comments (skin integrity, edema, etc.): sitting BP 140/80, sats on 40% FiO2 99 %  HR 90's      Pertinent Vitals/Pain Pain Assessment: Faces Faces Pain Scale: Hurts a little bit Pain Location: general  Pain Intervention(s): Monitored during session    Home Living                      Prior Function            PT Goals (current goals can now be found in the care plan section) Acute Rehab PT Goals Patient Stated Goal: none stated PT Goal Formulation: With patient Time For Goal Achievement: 01/06/20 Potential to Achieve Goals: Fair Progress towards PT goals: Progressing toward goals    Frequency    Min 4X/week      PT Plan Current plan remains appropriate    Co-evaluation  AM-PAC PT "6 Clicks" Mobility   Outcome Measure  Help needed turning from your back to your side while in a flat bed without using bedrails?: Total Help needed moving from lying on your back to sitting on the side of a flat bed without using bedrails?: Total Help needed moving to and from a bed to a chair (including a wheelchair)?: Total Help needed standing up from a chair using your arms (e.g., wheelchair or bedside chair)?: Total Help needed to walk in hospital room?: Total Help needed climbing 3-5 steps with a railing? : Total 6 Click Score: 6    End of Session   Activity Tolerance: Patient limited by fatigue Patient left: in bed;with SCD's reapplied;with bed alarm set Nurse Communication: Mobility status;Need  for lift equipment;Other (comment) PT Visit Diagnosis: Muscle weakness (generalized) (M62.81);Other symptoms and signs involving the nervous system (R29.898);Difficulty in walking, not elsewhere classified (R26.2)     Time: 2633-3545 PT Time Calculation (min) (ACUTE ONLY): 30 min  Charges:  $Therapeutic Activity: 23-37 mins                     12/26/2019  [name], PT Acute Rehabilitation Services [phone]  (pager) [phone]  (office)   Clifford Nichols 12/26/2019, 3:18 PM

## 2019-12-26 NOTE — Progress Notes (Signed)
Assisted tele visit to patient with mother.  [name] [name], RN   

## 2019-12-26 NOTE — Progress Notes (Signed)
Assisted tele visit to patient with wife.  Daltyn Degroat D Breland Trouten, RN   

## 2019-12-26 NOTE — Progress Notes (Signed)
NAME:  Clifford Nichols, MRN:  [id number], DOB:  Feb 27, 1981, LOS: 7 [facility] ADMISSION DATE:  12/04/2019, [facility] Admission DATE:  12/04/19 REFERRING MD:  Dr Mortimer Fries, CCM, CHIEF COMPLAINT:  Acute respiratory failure   Brief History   39 year old obese man with hypertension, diabetes admitted with acute respiratory failure, ARDS from COVID-19 pneumonia, initial positive test 5/10  Past Medical History    has a past medical history of Abscess of upper back excluding scapular region, Diabetes mellitus without complication ([place]), GERD (gastroesophageal reflux disease), Hypertension, and Sebaceous cyst.  Significant Hospital Events   5/10 admitted [facility]  5/11 failed HHFNC, BiPAP.  Required intubation 5/12 transferred to [facility], evaluated by opthal for proptosis- no intervention required 5/17 Started paralytics, ECMO team consulted Not a candidate 5/19 Started proning 5/23 Off pressors 5/26 Proning stopped, continue paralytics 5/27 New tongue wound 2/2 bite block in place, weaned off ketamine. Daily fevers 5/28 Trach, stop paralytics 6/1 Out of airborne isolation, family visiting, on PSV weans  Consults:  Ophthalmology for proptosis 5/12  Procedures:  ETT 5/11 >> 5/28 L femoral CVC 5/11 >> 5/17 R radial art line 5/17 >> 5/28 L IJ CVC 5/17 >> 6/1  Trach 5/28  Significant Diagnostic Tests:  Head CT 5/11 >> no significant intracranial abnormality.  Scattered opacification and air-fluid levels in the frontal, ethmoid, sphenoid and maxillary sinuses of unclear significance.  Head CT 5/12 >> no significant intracranial abnormality  Lower extremity Doppler 5/14 >> negative for DVT  Micro Data:  SARS CoV2 5/10 >> positive MRSA screen 5/12 >> negative Respiratory 5/12 >> negative Blood 5/17 >> Staph epi and Staph hominis Respiratory 5/17 >> Staph epi 5/24 resp cx bal: staph epi 5/24 BAL acid fast: smear negative cx pending 5/24 BAL fungal:pending 5/26 blood: No growth BAL 5/28-Acinetobacter,  Proteus, Pseudomonas, Enterococcus Trach asp 6/1- Pseudomonas  Antimicrobials:  Remdesivir 5/11 >> 5/15 Dexamethasone 5/11 >> 5/20 Tocilizumab 5/12 Cefepime 5/17 >> 5/21 Linezolid 5/17 >> 5/23 Zosyn 5/21 >>  5/29 Vanco 5/28 >> 6/1  Interim history/subjective:   No acute events.  Starting pressure support weans Continues to have thick secretions.  Afebrile today but on a cooling blanket  Objective   Blood pressure (!) 145/83, pulse (!) 101, temperature 99.6 F (37.6 C), temperature source Core, resp. rate (!) 35, height _0  (1.702 m), weight (!) 176.2 kg, SpO2 99 %.    Vent Mode: PRVC FiO2 (%):  [50 %-60 %] 50 % Set Rate:  [35 bmp] 35 bmp Vt Set:  [470 mL] 470 mL PEEP:  [10 cmH20] 10 cmH20 Plateau Pressure:  [24 cmH20-26 cmH20] 24 cmH20   Intake/Output Summary (Last 24 hours) at 12/26/2019 0850 Last data filed at 12/26/2019 0700 Gross per 24 hour  Intake 2721.42 ml  Output 4475 ml  Net -1753.58 ml   Filed Weights   12/24/19 0410 12/25/19 0332 12/26/19 0403  Weight: (!) 181.6 kg (!) 179.1 kg (!) 176.2 kg    Examination: Gen:      No acute distress, obese HEENT:  EOMI, sclera anicteric Neck:     No masses; no thyromegaly, trach Lungs:    Clear to auscultation bilaterally; normal respiratory effort CV:         Regular rate and rhythm; no murmurs Abd:      + bowel sounds; soft, non-tender; no palpable masses, no distension Ext:    No edema; adequate peripheral perfusion Skin:      Warm and dry; no rash Neuro: Sedated  Lab  significant for BUN/creatinine 45/1.46, PCT 0.7 Chest x-ray 6/3-vascular congestion, edema left greater than right  Resolved Hospital Problem list    Left proptosis, noted with coughing early 5/12, resolved.  Apparently per wife he has episodic intermittent proptosis at home as well. Head CT 5/12 reassuring Ophthalmology did evaluate, recommended that he will need outpatient evaluation but no intervention at this time beyond keeping the eyes  moist.  Septic shock, present on admission. Tongue necrosis secondary to biting down Hypertriglyceridemia  Assessment & Plan:   ARDS due to COVID-19 pneumonia  Morbid obesity, possible OSA.  Will certainly impact respiratory mechanics Acinetobacter HAP Continue vent support, wean FiO2 Will require higher PEEP due to obesity PSV weans  Acute toxic metabolic encephalopathy, need for sedation Off fentanyl and verse drips Continue enteral clonazepam and oxycodone as ordered Added seroquel  Fevers - Due to COVID-19 pneumonia.  Staph bacteremia and multiple organisms on recent BAL ID does not feel that the BAL cultures are significant as colony counts are low Observe off antibiotics as procalcitonin is improving  AKI. Stable Lasix 40 gm qd  Hypernatremia Continue free water. Increase dose.   Thrombocytopenia Anemia of chronic disease: Status post transfusion No overt bleeding.  Follow labs.   Best practice:  Diet: TF Pain/Anxiety/Delirium protocol (if indicated): Clonazepam, oxycodone, Seroquel VAP protocol (if indicated): per protocol DVT prophylaxis: enoxaparin 0.5 mg/kg qday. Intermediate dosing due to obesity GI prophylaxis: pepcid Glucose control: SSI protocol. Levemir Mobility: PT consult Code Status: Full Family Communication: Pending Disposition: ICU.   Critical care time:    The patient is critically ill with multiple organ system failure and requires high complexity decision making for assessment and support, frequent evaluation and titration of therapies, advanced monitoring, review of radiographic studies and interpretation of complex data.   Critical Care Time devoted to patient care services, exclusive of separately billable procedures, described in this note is 35 minutes.   Marshell Garfinkel MD [facility] Please see [email or web address] for pager details.  12/26/2019, 8:50 AM

## 2019-12-27 DIAGNOSIS — L8990 Pressure ulcer of unspecified site, unspecified stage: Secondary | ICD-10-CM | POA: Insufficient documentation

## 2019-12-27 LAB — CBC
HCT: 30.6 % — ABNORMAL LOW (ref 39.0–52.0)
Hemoglobin: 8.4 g/dL — ABNORMAL LOW (ref 13.0–17.0)
MCH: 29.7 pg (ref 26.0–34.0)
MCHC: 27.5 g/dL — ABNORMAL LOW (ref 30.0–36.0)
MCV: 108.1 fL — ABNORMAL HIGH (ref 80.0–100.0)
Platelets: 167 10*3/uL (ref 150–400)
RBC: 2.83 MIL/uL — ABNORMAL LOW (ref 4.22–5.81)
RDW: 16.9 % — ABNORMAL HIGH (ref 11.5–15.5)
WBC: 7.3 10*3/uL (ref 4.0–10.5)
nRBC: 0 % (ref 0.0–0.2)

## 2019-12-27 LAB — PHOSPHORUS: Phosphorus: 4.4 mg/dL (ref 2.5–4.6)

## 2019-12-27 LAB — GLUCOSE, CAPILLARY
Glucose-Capillary: 111 mg/dL — ABNORMAL HIGH (ref 70–99)
Glucose-Capillary: 131 mg/dL — ABNORMAL HIGH (ref 70–99)
Glucose-Capillary: 134 mg/dL — ABNORMAL HIGH (ref 70–99)
Glucose-Capillary: 137 mg/dL — ABNORMAL HIGH (ref 70–99)
Glucose-Capillary: 139 mg/dL — ABNORMAL HIGH (ref 70–99)
Glucose-Capillary: 146 mg/dL — ABNORMAL HIGH (ref 70–99)

## 2019-12-27 LAB — BASIC METABOLIC PANEL
Anion gap: 12 (ref 5–15)
BUN: 35 mg/dL — ABNORMAL HIGH (ref 6–20)
CO2: 32 mmol/L (ref 22–32)
Calcium: 9.3 mg/dL (ref 8.9–10.3)
Chloride: 105 mmol/L (ref 98–111)
Creatinine, Ser: 1.4 mg/dL — ABNORMAL HIGH (ref 0.61–1.24)
GFR calc Af Amer: >60 mL/min (ref >60)
GFR calc non Af Amer: >60 mL/min (ref >60)
Glucose, Bld: 157 mg/dL — ABNORMAL HIGH (ref 70–99)
Potassium: 3.7 mmol/L (ref 3.5–5.1)
Sodium: 149 mmol/L — ABNORMAL HIGH (ref 135–145)

## 2019-12-27 LAB — MAGNESIUM: Magnesium: 2 mg/dL (ref 1.7–2.4)

## 2019-12-27 MED ORDER — FREE WATER
400.0000 mL | Status: DC
  Administered 2019-12-27 – 2019-12-30 (×20): 400 mL
  Administered 2019-12-31: 300 mL
  Administered 2019-12-31 (×2): 400 mL

## 2019-12-27 NOTE — Progress Notes (Signed)
NAME:  Clifford Nichols, MRN:  [id number], DOB:  1981-03-12, LOS: 66 [facility] ADMISSION DATE:  12/04/2019, [facility] Admission DATE:  12/04/19 REFERRING MD:  Dr Mortimer Fries, CCM, CHIEF COMPLAINT:  Acute respiratory failure   Brief History   39 year old obese man with hypertension, diabetes admitted with acute respiratory failure, ARDS from COVID-19 pneumonia, initial positive test 5/10  Past Medical History    has a past medical history of Abscess of upper back excluding scapular region, Diabetes mellitus without complication ([place]), GERD (gastroesophageal reflux disease), Hypertension, and Sebaceous cyst.  Significant Hospital Events   5/10 admitted [facility]  5/11 failed HHFNC, BiPAP.  Required intubation 5/12 transferred to [facility], evaluated by opthal for proptosis- no intervention required 5/17 Started paralytics, ECMO team consulted Not a candidate 5/19 Started proning 5/23 Off pressors 5/26 Proning stopped, continue paralytics 5/27 New tongue wound 2/2 bite block in place, weaned off ketamine. Daily fevers 5/28 Trach, stop paralytics 6/1 Out of airborne isolation, family visiting, on PSV weans  Consults:  Ophthalmology for proptosis 5/12  Procedures:  ETT 5/11 >> 5/28 L femoral CVC 5/11 >> 5/17 R radial art line 5/17 >> 5/28 L IJ CVC 5/17 >> 6/1  Trach 5/28  Significant Diagnostic Tests:  Head CT 5/11 >> no significant intracranial abnormality.  Scattered opacification and air-fluid levels in the frontal, ethmoid, sphenoid and maxillary sinuses of unclear significance.  Head CT 5/12 >> no significant intracranial abnormality  Lower extremity Doppler 5/14 >> negative for DVT  Micro Data:  SARS CoV2 5/10 >> positive MRSA screen 5/12 >> negative Respiratory 5/12 >> negative Blood 5/17 >> Staph epi and Staph hominis Respiratory 5/17 >> Staph epi 5/24 resp cx bal: staph epi 5/24 BAL acid fast: smear negative cx pending 5/24 BAL fungal:pending 5/26 blood: No growth BAL 5/28-Acinetobacter,  Proteus, Pseudomonas, Enterococcus Trach asp 6/1- Pseudomonas  Antimicrobials:  Remdesivir 5/11 >> 5/15 Dexamethasone 5/11 >> 5/20 Tocilizumab 5/12 Cefepime 5/17 >> 5/21 Linezolid 5/17 >> 5/23 Zosyn 5/21 >>  5/29 Vanco 5/28 >> 6/1  Interim history/subjective:   Tolerating pressure support.  Negative I&O.  Awake alert  Objective   Blood pressure (!) 147/84, pulse (!) 103, temperature 99.4 F (37.4 C), temperature source Core, resp. rate (!) 30, height _0  (1.702 m), weight (!) 173.8 kg, SpO2 97 %.    Vent Mode: CPAP;PSV FiO2 (%):  [40 %-50 %] 40 % Set Rate:  [35 bmp] 35 bmp Vt Set:  [470 mL] 470 mL PEEP:  [8 cmH20-10 cmH20] 8 cmH20 Pressure Support:  [8 cmH20-10 cmH20] 8 cmH20 Plateau Pressure:  [26 cmH20] 26 cmH20   Intake/Output Summary (Last 24 hours) at 12/27/2019 1011 Last data filed at 12/27/2019 0700 Gross per 24 hour  Intake 2020 ml  Output 3800 ml  Net -1780 ml   Filed Weights   12/25/19 0332 12/26/19 0403 12/27/19 0442  Weight: (!) 179.1 kg (!) 176.2 kg (!) 173.8 kg    Examination: General: Morbid obese male who is awake alert no acute distress HEENT tracheostomy in place to pressure support tube feeding via feeding tube Neuro: Grossly intact CV: Heart sounds are regular PULM: Diminished in the bases Vent pressure support ventilation FIO2 40% PEEP 8 RATE VT 460  GI: soft, bsx4 active, obese  Extremities: warm/dry, 2+ edema  Skin: no rashes or lesions   Resolved Hospital Problem list    Left proptosis, noted with coughing early 5/12, resolved.  Apparently per wife he has episodic intermittent proptosis at home as well. Head CT  5/12 reassuring Ophthalmology did evaluate, recommended that he will need outpatient evaluation but no intervention at this time beyond keeping the eyes moist.  Septic shock, present on admission. Tongue necrosis secondary to biting down Hypertriglyceridemia  Assessment & Plan:   ARDS due to COVID-19 pneumonia  Morbid  obesity, possible OSA.  Will certainly impact respiratory mechanics Acinetobacter HAP Wean vent as tolerated Acute toxic metabolic encephalopathy, need for sedation resolved  Fevers - Due to COVID-19 pneumonia.  Monitoring off antibiotics AKI. Stable Lab Results  Component Value Date   CREATININE 1.40 (H) 12/27/2019   CREATININE 1.46 (H) 12/26/2019   CREATININE 1.65 (H) 12/25/2019    Lasix as tolerated  Hypernatremia Recent Labs  Lab 12/25/19 0345 12/26/19 0241 12/27/19 0526  NA 149* 148* 149*    Continue free water monitor sodium  Thrombocytopenia Anemia of chronic disease: Status post transfusion Continue to monitor for bleeding   Best practice:  Diet: TF Pain/Anxiety/Delirium protocol (if indicated): Clonazepam, oxycodone, Seroquel VAP protocol (if indicated): per protocol DVT prophylaxis: enoxaparin 0.5 mg/kg qday. Intermediate dosing due to obesity GI prophylaxis: pepcid Glucose control: SSI protocol. Levemir Mobility: PT consult Code Status: Full Family Communication: family updated via elink Disposition: ICU.   Critical care time:    Richardson Landry Trenton Verne ACNP Acute Care Nurse Practitioner [place] Please consult Amion 12/27/2019, 10:12 AM

## 2019-12-27 NOTE — Care Management (Signed)
CM received verbal consult for LTACH. CM gave referral to both [facility] and [facility].  [facility] declined to offer a bed for pt at this time, only [facility] will offer a bed.  CM spoke with pts wife and informed of pending bed offer for pt.  Wife in agreement for [facility] liaison to contact her tomorrow to discuss option

## 2019-12-27 NOTE — Progress Notes (Signed)
SLP Cancellation Note  Patient Details Name: Clifford Nichols MRN: [id number] DOB: [birth year]   Cancelled treatment:       Reason Eval/Treat Not Completed: Other (comment). Could not successfully schedule in line PMSV with RT due to urgent ICU needs. Will continue efforts potentially over the weekend if possible.    Keileigh Vahey, Riley Nearing 12/27/2019, 2:36 PM

## 2019-12-27 NOTE — Progress Notes (Signed)
Physical Therapy Treatment Patient Details Name: Clifford Nichols MRN: [id number] DOB: [birth year] Today's Date: 12/27/2019    History of Present Illness 39 year old obese man with hypertension, diabetes, admitted 12/02/19 to [facility] with acute respiratory failure, ARDS from COVID-19 pneumonia, initial positive test 5/10. Intubated 5/11, required paralytics, proning, pressors, tracheostomy 5/28; Head CT 5/12 no abnormality. Seen by opthalmology due to left eye proptosis (no intervention recommended). Tongue necrosis due to pt biting down when orally intubated    PT Comments    Pt awake, alert and participative.  Following direction.  Emphasis on getting to the chair via maxisky to work on sitting tolerance, sitting balance and exercises.    Follow Up Recommendations  CIR     Equipment Recommendations  Other (comment)(TBA)    Recommendations for Other Services       Precautions / Restrictions Precautions Precautions: Fall    Mobility  Bed Mobility Overal bed mobility: Needs Assistance Bed Mobility: Rolling Rolling: Max assist         General bed mobility comments: rolled to add a skylift pad  Transfers                 General transfer comment: assisted pt to the chair with the maxisky to allow pt to gain tolerance for sitting up.  Ambulation/Gait                 Stairs             Wheelchair Mobility    Modified Rankin (Stroke Patients Only)       Balance   Sitting-balance support: Single extremity supported;No upper extremity supported Sitting balance-Leahy Scale: Fair Sitting balance - Comments: worked at edge of the chair  on sitting balance                                    Cognition Arousal/Alertness: Awake/alert Behavior During Therapy: WFL for tasks assessed/performed Overall Cognitive Status: Within Functional Limits for tasks assessed                                        Exercises Other  Exercises Other Exercises: hip/knee flex/ext exercise with resisted gross ext.    General Comments General comments (skin integrity, edema, etc.): vss  HR with activity  120's up from 110's, sats 96 to 98% on PRVC with 40% FiO2      Pertinent Vitals/Pain Pain Assessment: No/denies pain    Home Living                      Prior Function            PT Goals (current goals can now be found in the care plan section) Acute Rehab PT Goals Patient Stated Goal: none stated PT Goal Formulation: With patient Time For Goal Achievement: 01/06/20 Potential to Achieve Goals: Fair Progress towards PT goals: Progressing toward goals    Frequency    Min 4X/week      PT Plan Current plan remains appropriate    Co-evaluation              AM-PAC PT "6 Clicks" Mobility   Outcome Measure  Help needed turning from your back to your side while in a flat bed without using bedrails?: Total Help needed moving from lying on  your back to sitting on the side of a flat bed without using bedrails?: Total Help needed moving to and from a bed to a chair (including a wheelchair)?: Total Help needed standing up from a chair using your arms (e.g., wheelchair or bedside chair)?: Total Help needed to walk in hospital room?: Total Help needed climbing 3-5 steps with a railing? : Total 6 Click Score: 6    End of Session Equipment Utilized During Treatment: Oxygen Activity Tolerance: Patient tolerated treatment well Patient left: in chair;with call bell/phone within reach;with chair alarm set;Other (comment)(on maxisky lift.) Nurse Communication: Mobility status;Need for lift equipment;Other (comment) PT Visit Diagnosis: Muscle weakness (generalized) (M62.81);Other symptoms and signs involving the nervous system (R29.898);Difficulty in walking, not elsewhere classified (R26.2)     Time: 1455-1531 PT Time Calculation (min) (ACUTE ONLY): 36 min  Charges:  $Therapeutic Exercise: 8-22  mins $Therapeutic Activity: 8-22 mins                     12/27/2019  [name], PT Acute Rehabilitation Services [phone]  (pager) [phone]  (office)   [name] [name] 12/27/2019, 5:00 PM

## 2019-12-27 NOTE — Progress Notes (Signed)
CSW received call from Lauren at [facility] who states the facility can offer a bed to the patient pending insurance authorization and family discussion.  CSW notified Sam, RN CM of information.  Edwin Dada, MSW, LCSW-A Transitions of Care  Clinical Social Worker  [facility] Emergency Departments  Medical ICU [phone]

## 2019-12-28 ENCOUNTER — Inpatient Hospital Stay (HOSPITAL_COMMUNITY): Payer: No Typology Code available for payment source

## 2019-12-28 LAB — CBC
HCT: 27.6 % — ABNORMAL LOW (ref 39.0–52.0)
Hemoglobin: 7.7 g/dL — ABNORMAL LOW (ref 13.0–17.0)
MCH: 29.7 pg (ref 26.0–34.0)
MCHC: 27.9 g/dL — ABNORMAL LOW (ref 30.0–36.0)
MCV: 106.6 fL — ABNORMAL HIGH (ref 80.0–100.0)
Platelets: 155 10*3/uL (ref 150–400)
RBC: 2.59 MIL/uL — ABNORMAL LOW (ref 4.22–5.81)
RDW: 16.7 % — ABNORMAL HIGH (ref 11.5–15.5)
WBC: 7.2 10*3/uL (ref 4.0–10.5)
nRBC: 0 % (ref 0.0–0.2)

## 2019-12-28 LAB — MAGNESIUM: Magnesium: 1.9 mg/dL (ref 1.7–2.4)

## 2019-12-28 LAB — BASIC METABOLIC PANEL
Anion gap: 11 (ref 5–15)
BUN: 37 mg/dL — ABNORMAL HIGH (ref 6–20)
CO2: 30 mmol/L (ref 22–32)
Calcium: 8.9 mg/dL (ref 8.9–10.3)
Chloride: 104 mmol/L (ref 98–111)
Creatinine, Ser: 1.5 mg/dL — ABNORMAL HIGH (ref 0.61–1.24)
GFR calc Af Amer: >60 mL/min (ref >60)
GFR calc non Af Amer: 59 mL/min — ABNORMAL LOW (ref >60)
Glucose, Bld: 149 mg/dL — ABNORMAL HIGH (ref 70–99)
Potassium: 3.4 mmol/L — ABNORMAL LOW (ref 3.5–5.1)
Sodium: 145 mmol/L (ref 135–145)

## 2019-12-28 LAB — GLUCOSE, CAPILLARY
Glucose-Capillary: 106 mg/dL — ABNORMAL HIGH (ref 70–99)
Glucose-Capillary: 119 mg/dL — ABNORMAL HIGH (ref 70–99)
Glucose-Capillary: 134 mg/dL — ABNORMAL HIGH (ref 70–99)
Glucose-Capillary: 107 mg/dL — ABNORMAL HIGH (ref 70–99)
Glucose-Capillary: 112 mg/dL — ABNORMAL HIGH (ref 70–99)

## 2019-12-28 LAB — PHOSPHORUS: Phosphorus: 5 mg/dL — ABNORMAL HIGH (ref 2.5–4.6)

## 2019-12-28 MED ORDER — DIPHENHYDRAMINE HCL 12.5 MG/5ML PO ELIX
50.0000 mg | ORAL_SOLUTION | Freq: Once | ORAL | Status: AC | PRN
  Administered 2019-12-28: 50 mg

## 2019-12-28 MED ORDER — POTASSIUM CHLORIDE 20 MEQ PO PACK
40.0000 meq | PACK | Freq: Once | ORAL | Status: AC
  Administered 2019-12-28: 40 meq via ORAL
  Filled 2019-12-28: qty 2

## 2019-12-28 MED ORDER — DIPHENHYDRAMINE HCL 12.5 MG/5ML PO ELIX
50.0000 mg | ORAL_SOLUTION | Freq: Once | ORAL | Status: DC | PRN
  Filled 2019-12-28: qty 20

## 2019-12-28 MED ORDER — POTASSIUM CHLORIDE 20 MEQ PO PACK: 40.0000 meq | PACK | Freq: Two times a day (BID) | ORAL | Status: DC

## 2019-12-28 NOTE — Progress Notes (Signed)
PROGRESS NOTE    Clifford Nichols  GBT:[id number] DOB: Jan 10, 1981 DOA: 12/04/2019 PCP: [facility] [place]   Brief Narrative: Patient is a 39 year old with history of hypertension, diabetes type 2, morbid obesity who was admitted with acute respiratory failure from Covid pneumonia on 5/10 at [facility].  He failed high flow nasal cannula, BiPAP.  He developed ARDS and was intubated.  He was transferred to [facility] for evaluation by ophthalmology for proptosis, no intervention required.  He has been hemodynamically stable on vent.  Hospital course was prolonged due to persistent respiratory failure, status post trach.  PCCM following.  Patient transferred to [facility] service on 12/28/2019.  Current plan is to move to LTAC.  Currently not on isolation.  Assessment & Plan:   Active Problems:   HTN (hypertension)   Acute respiratory failure due to COVID-19 ([place])   Hyperglycemia   Diabetes ([place])   COVID-19   ARDS (adult respiratory distress syndrome) (HCC)   Acute respiratory failure (HCC)   Acute on chronic respiratory failure with hypoxia and hypercapnia (HCC)   Pressure injury of skin   Severe ARDS secondary to Covid pneumonia: Status post trach.  Is on vent.  Continue PSV trials.  Follow intermittent chest x-ray.  Currently on diuresis with lasix. Status post PEG placement.  Fever: Found to have multiple bacteria on BAL.  Case was discussed with ID, recommended to observe off antibiotics.  Hypernatremia: Continue free water.  Continue to monitor BMP  Normocytic anemia: Hemoglobin in the range of 7.  Anemia is most likely associated with critical illness.  Continue to monitor.  AKI: Currently stable  Morbid obesity: BMI of 59.1.  Monitor possible OSA.  Diabetes type 2: On sliding scale insulin and Levemir.  Continue to monitor CBGs  Disposition: Prolonged hospitalization.Initially recommended CIR by PT but current plan is to move him to LTAC.  Social worker closely  following.   Nutrition Problem: Increased nutrient needs Etiology: acute illness(COVID-19 PNA)      DVT prophylaxis:Lovenox Code Status: Full Family Communication:  Status is: Inpatient  Remains inpatient appropriate because:Unsafe d/c plan   Dispo: The patient is from: Home              Anticipated d/c is to: LTAC              Anticipated d/c date is: 3 days              Patient currently is not medically stable to d/c.    Consultants: PCCM  Events: 5/10 admitted [facility]  5/11 failed HHFNC, BiPAP.  Required intubation 5/12 transferred to [facility], evaluated by opthal for proptosis- no intervention required 5/17 Started paralytics, ECMO team consulted Not a candidate 5/19 Started proning 5/23 Off pressors 5/26 Proning stopped, continue paralytics 5/27 New tongue wound 2/2 bite block in place, weaned off ketamine. Daily fevers 5/28 Trach, stop paralytics 6/1 Out of airborne isolation, family visiting, on PSV weans  Procedures: ETT 5/11 >> 5/28 L femoral CVC 5/11 >> 5/17 R radial art line 5/17 >> 5/28 L IJ CVC 5/17 >> 6/1 Trach 5/28   Antimicrobials:  Anti-infectives (From admission, onward)   Start     Dose/Rate Route Frequency Ordered Stop   12/21/19 0900  vancomycin (VANCOREADY) IVPB 2000 mg/400 mL     2,000 mg 200 mL/hr over 120 Minutes Intravenous Every 24 hours 12/20/19 0845 12/24/19 1156   12/20/19 0900  vancomycin (VANCOCIN) 2,500 mg in sodium chloride 0.9 % 500 mL  IVPB     2,500 mg 250 mL/hr over 120 Minutes Intravenous  Once 12/20/19 0845 12/20/19 1237   12/14/19 0000  piperacillin-tazobactam (ZOSYN) IVPB 3.375 g     3.375 g 12.5 mL/hr over 240 Minutes Intravenous Every 8 hours 12/13/19 1652 12/19/19 1953   12/13/19 1700  piperacillin-tazobactam (ZOSYN) IVPB 3.375 g     3.375 g 100 mL/hr over 30 Minutes Intravenous  Once 12/13/19 1651 12/13/19 1741   12/10/19 0100  ceFEPIme (MAXIPIME) 2 g in sodium chloride 0.9 % 100 mL IVPB  Status:  Discontinued     2  g 200 mL/hr over 30 Minutes Intravenous Every 8 hours 12/09/19 2044 12/12/19 0942   12/09/19 2200  ceftaroline (TEFLARO) 600 mg in sodium chloride 0.9 % 250 mL IVPB  Status:  Discontinued     600 mg 250 mL/hr over 60 Minutes Intravenous Every 12 hours 12/09/19 2023 12/09/19 2024   12/09/19 2200  ceftaroline (TEFLARO) 600 mg in sodium chloride 0.9 % 250 mL IVPB  Status:  Discontinued    Note to Pharmacy: Pharmacy may modify dose for renal failure.   600 mg 250 mL/hr over 60 Minutes Intravenous Every 12 hours 12/09/19 2024 12/09/19 2044   12/09/19 2130  linezolid (ZYVOX) IVPB 600 mg     600 mg 300 mL/hr over 60 Minutes Intravenous Every 12 hours 12/09/19 2044 12/15/19 2349   12/09/19 0900  ceFEPIme (MAXIPIME) 2 g in sodium chloride 0.9 % 100 mL IVPB  Status:  Discontinued     2 g 200 mL/hr over 30 Minutes Intravenous Every 8 hours 12/09/19 0836 12/09/19 2023   12/05/19 1000  remdesivir 100 mg in sodium chloride 0.9 % 100 mL IVPB     100 mg 200 mL/hr over 30 Minutes Intravenous Daily 12/04/19 1417 12/06/19 1003      Subjective: Patient seen and examined at the bedside this morning.  Comfortable.  Hemodynamically stable. No complains Objective: Vitals:   12/28/19 0410 12/28/19 0426 12/28/19 0500 12/28/19 0600  BP:   (!) 149/126 132/79  Pulse: (!) 101  (!) 104 (!) 103  Resp: (!) 33  (!) 35 (!) 35  Temp: 100.2 F (37.9 C)     TempSrc: Temporal     SpO2: 93%  94% (!) 89%  Weight:  (!) 171.4 kg    Height:        Intake/Output Summary (Last 24 hours) at 12/28/2019 0809 Last data filed at 12/28/2019 0600 Gross per 24 hour  Intake 2545 ml  Output 2550 ml  Net -5 ml   Filed Weights   12/26/19 0403 12/27/19 0442 12/28/19 0426  Weight: (!) 176.2 kg (!) 173.8 kg (!) 171.4 kg    Examination:  General exam: Appears calm and comfortable ,Not in distress, morbidly obese HEENT: Trach,feeding tube Respiratory system: Bilateral equal air entry, normal vesicular breath sounds, no wheezes or  crackles  Cardiovascular system: S1 & S2 heard, RRR. No JVD, murmurs, rubs, gallops or clicks. No pedal edema. Gastrointestinal system: Abdomen is nondistended, soft and nontender. No organomegaly or masses felt. Normal bowel sounds heard. Central nervous system: Alert and oriented. No focal neurological deficits. Extremities: No edema, no clubbing ,no cyanosis Skin: No rashes, lesions or ulcers,no icterus ,no pallor   Data Reviewed: I have personally reviewed following labs and imaging studies  CBC: Recent Labs  Lab 12/24/19 0416 12/25/19 0345 12/26/19 0241 12/27/19 0526 12/28/19 0456  WBC 9.1 8.2 7.8 7.3 7.2  NEUTROABS 6.8  --   --   --   --  HGB 8.3* 8.2* 7.8* 8.4* 7.7*  HCT 29.5* 29.4* 27.7* 30.6* 27.6*  MCV 108.1* 108.9* 107.8* 108.1* 106.6*  PLT 141* 148* 156 167 156   Basic Metabolic Panel: Recent Labs  Lab 12/23/19 0447 12/23/19 0447 12/24/19 0416 12/25/19 0345 12/26/19 0241 12/27/19 0526 12/28/19 0456  NA 145   < > 146* 149* 148* 149* 145  K 3.6   < > 3.5 3.4* 3.5 3.7 3.4*  CL 104   < > 106 108 108 105 104  CO2 29   < > _0 32 30  GLUCOSE 209*   < > 172* 178* 144* 157* 149*  BUN 33*   < > 48* 50* 45* 35* 37*  CREATININE 1.23   < > 1.83* 1.65* 1.46* 1.40* 1.50*  CALCIUM 8.8*   < > 8.8* 9.1 9.1 9.3 8.9  MG 2.0   < > 2.2 2.1 2.0 2.0 1.9  PHOS 3.4  --   --  4.4 4.3 4.4 5.0*   < > = values in this interval not displayed.   GFR: Estimated Creatinine Clearance: 104.2 mL/min (A) (by C-G formula based on SCr of 1.5 mg/dL (H)). Liver Function Tests: Recent Labs  Lab 12/22/19 0226 12/23/19 0447  AST 107* 100*  ALT 117* 104*  ALKPHOS 45 52  BILITOT 0.7 0.9  PROT 6.0* 6.4*  ALBUMIN 2.1* 2.0*   No results for input(s): LIPASE, AMYLASE in the last 168 hours. No results for input(s): AMMONIA in the last 168 hours. Coagulation Profile: No results for input(s): INR, PROTIME in the last 168 hours. Cardiac Enzymes: No results for input(s): CKTOTAL, CKMB,  CKMBINDEX, TROPONINI in the last 168 hours. BNP (last 3 results) No results for input(s): PROBNP in the last 8760 hours. HbA1C: No results for input(s): HGBA1C in the last 72 hours. CBG: Recent Labs  Lab 12/27/19 1159 12/27/19 1538 12/27/19 2014 12/27/19 2341 12/28/19 0408  GLUCAP 134* 111* 131* 137* 106*   Lipid Profile: No results for input(s): CHOL, HDL, LDLCALC, TRIG, CHOLHDL, LDLDIRECT in the last 72 hours. Thyroid Function Tests: No results for input(s): TSH, T4TOTAL, FREET4, T3FREE, THYROIDAB in the last 72 hours. Anemia Panel: No results for input(s): VITAMINB12, FOLATE, FERRITIN, TIBC, IRON, RETICCTPCT in the last 72 hours. Sepsis Labs: Recent Labs  Lab 12/24/19 0852 12/25/19 0345 12/26/19 0241  PROCALCITON 1.89 1.05 0.70    Recent Results (from the past 240 hour(s))  Culture, blood (Routine X 2) w Reflex to ID Panel     Status: None   Collection Time: 12/18/19  1:10 PM   Specimen: BLOOD  Result Value Ref Range Status   Specimen Description BLOOD BLOOD LEFT HAND  Final   Special Requests AEROBIC BOTTLE ONLY Blood Culture adequate volume  Final   Culture   Final    NO GROWTH 5 DAYS Performed at [facility], [place] [address], [place], [place] [address]    Report Status 12/23/2019 FINAL  Final  Culture, blood (Routine X 2) w Reflex to ID Panel     Status: None   Collection Time: 12/18/19  1:10 PM   Specimen: BLOOD  Result Value Ref Range Status   Specimen Description BLOOD BLOOD LEFT HAND  Final   Special Requests AEROBIC BOTTLE ONLY Blood Culture adequate volume  Final   Culture   Final    NO GROWTH 5 DAYS Performed at [facility], [place] [address], [place], [place] [address]    Report Status 12/23/2019 FINAL  Final  Fungus Culture With Stain     Status: None (Preliminary result)   Collection Time: 12/20/19 12:38 PM   Specimen: Bronchial Alveolar Lavage  Result Value Ref Range Status   Fungus Stain Final report  Final    Comment:  (NOTE) Performed At: [facility] [address] [place], [place] [id number] Rush Farmer MD JX:[id number]    Fungus (Mycology) Culture PENDING  Incomplete   Fungal Source BRONCHIAL ALVEOLAR LAVAGE  Final  Culture, bal-quantitative     Status: Abnormal   Collection Time: 12/20/19 12:38 PM   Specimen: Bronchoalveolar Lavage  Result Value Ref Range Status   Specimen Description BRONCHIAL ALVEOLAR LAVAGE  Final   Special Requests NONE  Final   Gram Stain   Final    RARE WBC PRESENT, PREDOMINANTLY PMN NO ORGANISMS SEEN Performed at [facility], [place] [address] [place], [place] [address]    Culture (A)  Final    7,000 COLONIES/mL PROTEUS MIRABILIS 5,000 COLONIES/mL ENTEROCOCCUS FAECALIS 7,000 COLONIES/mL ACINETOBACTER CALCOACETICUS/BAUMANNII COMPLEX 1,000 COLONIES/mL PSEUDOMONAS AERUGINOSA    Report Status 12/24/2019 FINAL  Final   Organism ID, Bacteria ACINETOBACTER CALCOACETICUS/BAUMANNII COMPLEX (A)  Final   Organism ID, Bacteria PROTEUS MIRABILIS (A)  Final   Organism ID, Bacteria ENTEROCOCCUS FAECALIS (A)  Final   Organism ID, Bacteria PSEUDOMONAS AERUGINOSA (A)  Final      Susceptibility   Acinetobacter calcoaceticus/baumannii complex - MIC*    CEFTAZIDIME >=64 RESISTANT Resistant     CEFTRIAXONE >=64 RESISTANT Resistant     CIPROFLOXACIN >=4 RESISTANT Resistant     GENTAMICIN <=1 SENSITIVE Sensitive     IMIPENEM >=16 RESISTANT Resistant     PIP/TAZO >=128 RESISTANT Resistant     TRIMETH/SULFA <=20 SENSITIVE Sensitive     CEFEPIME >=64 RESISTANT Resistant     AMPICILLIN/SULBACTAM 4 SENSITIVE Sensitive     * 7,000 COLONIES/mL ACINETOBACTER CALCOACETICUS/BAUMANNII COMPLEX   Enterococcus faecalis - MIC*    AMPICILLIN <=2 SENSITIVE Sensitive     VANCOMYCIN 1 SENSITIVE Sensitive     GENTAMICIN SYNERGY SENSITIVE Sensitive     * 5,000 COLONIES/mL ENTEROCOCCUS FAECALIS   Pseudomonas aeruginosa - MIC*    CEFTAZIDIME 4 SENSITIVE Sensitive     CIPROFLOXACIN >=4  RESISTANT Resistant     GENTAMICIN >=16 RESISTANT Resistant     IMIPENEM >=16 RESISTANT Resistant     PIP/TAZO 8 SENSITIVE Sensitive     CEFEPIME 4 SENSITIVE Sensitive     * 1,000 COLONIES/mL PSEUDOMONAS AERUGINOSA   Proteus mirabilis - MIC*    AMPICILLIN <=2 SENSITIVE Sensitive     CEFAZOLIN <=4 SENSITIVE Sensitive     CEFEPIME <=1 SENSITIVE Sensitive     CEFTAZIDIME <=1 SENSITIVE Sensitive     CEFTRIAXONE <=1 SENSITIVE Sensitive     CIPROFLOXACIN <=0.25 SENSITIVE Sensitive     GENTAMICIN <=1 SENSITIVE Sensitive     IMIPENEM 2 SENSITIVE Sensitive     TRIMETH/SULFA <=20 SENSITIVE Sensitive     AMPICILLIN/SULBACTAM <=2 SENSITIVE Sensitive     PIP/TAZO <=4 SENSITIVE Sensitive     * 7,000 COLONIES/mL PROTEUS MIRABILIS  Fungus Culture Result     Status: None   Collection Time: 12/20/19 12:38 PM  Result Value Ref Range Status   Result 1 Comment  Final    Comment: (NOTE) KOH/Calcofluor preparation:  no fungus observed. Performed At: [facility] [place], [place] [id number] Rush Farmer MD BM:[id number]   Culture, respiratory (non-expectorated)     Status: None   Collection Time: 12/24/19  9:23 AM   Specimen: Tracheal Aspirate; Respiratory  Result Value Ref Range Status   Specimen Description TRACHEAL ASPIRATE  Final   Special Requests Normal  Final   Gram Stain   Final    ABUNDANT WBC PRESENT,BOTH PMN AND MONONUCLEAR ABUNDANT GRAM POSITIVE RODS Performed at [facility], [place] [address] [place], [place] [address]    Culture   Final    FEW PSEUDOMONAS AERUGINOSA FEW PROTEUS MIRABILIS    Report Status 12/26/2019 FINAL  Final   Organism ID, Bacteria PSEUDOMONAS AERUGINOSA  Final   Organism ID, Bacteria PROTEUS MIRABILIS  Final      Susceptibility   Pseudomonas aeruginosa - MIC*    CEFTAZIDIME 2 SENSITIVE Sensitive     CIPROFLOXACIN 2 INTERMEDIATE Intermediate     GENTAMICIN >=16 RESISTANT Resistant     IMIPENEM >=16 RESISTANT Resistant      PIP/TAZO <=4 SENSITIVE Sensitive     CEFEPIME <=1 SENSITIVE Sensitive     * FEW PSEUDOMONAS AERUGINOSA   Proteus mirabilis - MIC*    AMPICILLIN <=2 SENSITIVE Sensitive     CEFAZOLIN <=4 SENSITIVE Sensitive     CEFEPIME <=1 SENSITIVE Sensitive     CEFTAZIDIME <=1 SENSITIVE Sensitive     CEFTRIAXONE <=1 SENSITIVE Sensitive     CIPROFLOXACIN <=0.25 SENSITIVE Sensitive     GENTAMICIN <=1 SENSITIVE Sensitive     IMIPENEM 2 SENSITIVE Sensitive     TRIMETH/SULFA <=20 SENSITIVE Sensitive     AMPICILLIN/SULBACTAM <=2 SENSITIVE Sensitive     PIP/TAZO <=4 SENSITIVE Sensitive     * FEW PROTEUS MIRABILIS         Radiology Studies: No results found.      Scheduled Meds: . vitamin C  500 mg Per Tube Daily  . chlorhexidine gluconate (MEDLINE KIT)  15 mL Mouth Rinse BID  . Chlorhexidine Gluconate Cloth  6 each Topical Daily  . clonazePAM  2 mg Per Tube BID  . enoxaparin (LOVENOX) injection  0.5 mg/kg Subcutaneous Q24H  . famotidine  20 mg Per Tube BID  . feeding supplement (PRO-STAT SUGAR FREE 64)  60 mL Per Tube TID  . fentaNYL (SUBLIMAZE) injection  50 mcg Intravenous Once  . free water  400 mL Per Tube Q4H  . furosemide  40 mg Intravenous Daily  . insulin aspart  0-20 Units Subcutaneous Q4H  . insulin detemir  5 Units Subcutaneous BID  . mouth rinse  15 mL Mouth Rinse 10 times per day  . oxyCODONE  10 mg Per Tube Q4H  . QUEtiapine  50 mg Per Tube BID  . sodium chloride flush  10-40 mL Intracatheter Q12H  . zinc sulfate  220 mg Per Tube Daily   Continuous Infusions: . sodium chloride 11 mL/hr at 12/25/19 0341  . sodium chloride 10 mL/hr at 12/28/19 0600  . feeding supplement (VITAL 1.5 CAL) 50 mL/hr at 12/27/19 1800     LOS: 24 days    Time spent:35 mins, More than 50% of that time was spent in counseling and/or coordination of care.      Shelly Coss, MD [facility] P6/11/2019, 8:09 AM

## 2019-12-28 NOTE — Progress Notes (Signed)
This note also relates to the following rows which could not be included: SpO2 - Cannot attach notes to unvalidated device data  RT NOTE: RT removed sutures per CCM as patient is 8 days post trach procedure. Both sutures removed, trach care completed and inner cannula changed. Janina Mayo ties changed and secured. Vitals are stable. RT will continue to monitor.

## 2019-12-28 NOTE — Progress Notes (Signed)
eLink Physician-Brief Progress Note Patient Name: Clifford Nichols DOB: Jun 22, 1981 MRN: [id number]   Date of Service  12/28/2019  HPI/Events of Note  Request for sleep aid. Patient seen trached.  eICU Interventions  Benadryl 50 mg per tube ordered     Intervention Category Minor Interventions: Routine modifications to care plan (e.g. PRN medications for pain, fever)  Irving Burton T Malyk Girouard 12/28/2019, 3:00 AM

## 2019-12-29 LAB — CBC WITH DIFFERENTIAL/PLATELET
Abs Immature Granulocytes: 0.05 10*3/uL (ref 0.00–0.07)
Basophils Absolute: 0 10*3/uL (ref 0.0–0.1)
Basophils Relative: 0 %
Eosinophils Absolute: 0.4 10*3/uL (ref 0.0–0.5)
Eosinophils Relative: 5 %
HCT: 28.3 % — ABNORMAL LOW (ref 39.0–52.0)
Hemoglobin: 8.1 g/dL — ABNORMAL LOW (ref 13.0–17.0)
Immature Granulocytes: 1 %
Lymphocytes Relative: 24 %
Lymphs Abs: 1.9 10*3/uL (ref 0.7–4.0)
MCH: 29.9 pg (ref 26.0–34.0)
MCHC: 28.6 g/dL — ABNORMAL LOW (ref 30.0–36.0)
MCV: 104.4 fL — ABNORMAL HIGH (ref 80.0–100.0)
Monocytes Absolute: 0.5 10*3/uL (ref 0.1–1.0)
Monocytes Relative: 6 %
Neutro Abs: 5 10*3/uL (ref 1.7–7.7)
Neutrophils Relative %: 64 %
Platelets: 151 10*3/uL (ref 150–400)
RBC: 2.71 MIL/uL — ABNORMAL LOW (ref 4.22–5.81)
RDW: 16.3 % — ABNORMAL HIGH (ref 11.5–15.5)
WBC: 7.9 10*3/uL (ref 4.0–10.5)
nRBC: 0 % (ref 0.0–0.2)

## 2019-12-29 LAB — BASIC METABOLIC PANEL
Anion gap: 13 (ref 5–15)
BUN: 34 mg/dL — ABNORMAL HIGH (ref 6–20)
CO2: 31 mmol/L (ref 22–32)
Calcium: 9 mg/dL (ref 8.9–10.3)
Chloride: 101 mmol/L (ref 98–111)
Creatinine, Ser: 1.47 mg/dL — ABNORMAL HIGH (ref 0.61–1.24)
GFR calc Af Amer: >60 mL/min (ref >60)
GFR calc non Af Amer: >60 mL/min (ref >60)
Glucose, Bld: 121 mg/dL — ABNORMAL HIGH (ref 70–99)
Potassium: 3.6 mmol/L (ref 3.5–5.1)
Sodium: 145 mmol/L (ref 135–145)

## 2019-12-29 LAB — GLUCOSE, CAPILLARY
Glucose-Capillary: 102 mg/dL — ABNORMAL HIGH (ref 70–99)
Glucose-Capillary: 121 mg/dL — ABNORMAL HIGH (ref 70–99)
Glucose-Capillary: 122 mg/dL — ABNORMAL HIGH (ref 70–99)
Glucose-Capillary: 122 mg/dL — ABNORMAL HIGH (ref 70–99)
Glucose-Capillary: 124 mg/dL — ABNORMAL HIGH (ref 70–99)
Glucose-Capillary: 132 mg/dL — ABNORMAL HIGH (ref 70–99)
Glucose-Capillary: 138 mg/dL — ABNORMAL HIGH (ref 70–99)

## 2019-12-29 LAB — PHOSPHORUS: Phosphorus: 5.2 mg/dL — ABNORMAL HIGH (ref 2.5–4.6)

## 2019-12-29 MED ORDER — COLLAGENASE 250 UNIT/GM EX OINT
TOPICAL_OINTMENT | Freq: Every day | CUTANEOUS | Status: DC
  Administered 2020-01-11: 1 via TOPICAL
  Filled 2019-12-29 (×2): qty 30

## 2019-12-29 MED ORDER — POTASSIUM CHLORIDE 20 MEQ PO PACK
40.0000 meq | PACK | ORAL | Status: AC
  Administered 2019-12-29 (×2): 40 meq
  Filled 2019-12-29: qty 2

## 2019-12-29 MED ORDER — POTASSIUM CHLORIDE 20 MEQ PO PACK: 40.0000 meq | PACK | ORAL | Status: DC

## 2019-12-29 MED ORDER — POTASSIUM CHLORIDE 20 MEQ PO PACK
40.0000 meq | PACK | ORAL | Status: DC
  Filled 2019-12-29: qty 2

## 2019-12-29 MED ORDER — WHITE PETROLATUM EX OINT
TOPICAL_OINTMENT | CUTANEOUS | Status: AC
  Administered 2019-12-29: 0.2
  Filled 2019-12-29: qty 28.35

## 2019-12-29 NOTE — Consult Note (Signed)
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 Patient is turning and repositioning more than previously and is even getting OOB to a chair, so a pressure redistribution chair pad is also provided.  Patient has Network engineer and they are used intermittently.  The heels are intact.  WOC nursing team will follow, seeing patient every 7-10 days and will remain available to this patient, the nursing and medical teams.   Thanks, Ladona Mow, MSN, RN, GNP, [place]  Pager# [phone]

## 2019-12-29 NOTE — Progress Notes (Signed)
Assisted tele visit to patient with family member.  Lakeasha Petion Ann, RN  

## 2019-12-29 NOTE — Progress Notes (Signed)
PROGRESS NOTE    Clifford Nichols  XNA:[id number] DOB: 05-Dec-1980 DOA: 12/04/2019 PCP: [facility] [place]   Brief Narrative: Patient is a 39 year old with history of hypertension, diabetes type 2, morbid obesity who was admitted with acute respiratory failure from Covid pneumonia on 5/10 at [facility].  He failed high flow nasal cannula, BiPAP.  He developed ARDS and was intubated.  He was transferred to [facility] for evaluation by ophthalmology for proptosis, no intervention required.  He has been hemodynamically stable on vent.  Hospital course was prolonged due to persistent respiratory failure, status post trach.  PCCM following.  Patient transferred to [facility] service on 12/28/2019.  Current plan is to move to LTAC.  Currently not on isolation.  Assessment & Plan:   Active Problems:   HTN (hypertension)   Acute respiratory failure due to COVID-19 ([place])   Hyperglycemia   Diabetes ([place])   COVID-19   ARDS (adult respiratory distress syndrome) (HCC)   Acute respiratory failure (HCC)   Acute on chronic respiratory failure with hypoxia and hypercapnia (HCC)   Pressure injury of skin   Severe ARDS secondary to Covid pneumonia: Status post trach.  Is on vent.  Continue PSV trials.  Follow intermittent chest x-ray.  Currently on diuresis with lasix. Status post PEG placement.  Fever: Found to have multiple bacteria on BAL.  Case was discussed with ID, recommended to observe off antibiotics.  Hypernatremia: Continue free water.  Continue to monitor BMP  Normocytic anemia: Hemoglobin in the range of 7.  Anemia is most likely associated with critical illness.  Continue to monitor.  AKI: Currently stable  Morbid obesity: BMI of 59.1.  Monitor possible OSA.  Diabetes type 2: On sliding scale insulin and Levemir.  Continue to monitor CBGs  Disposition: Prolonged hospitalization.Initially recommended CIR by PT but current plan is to move him to LTAC.  Social worker closely  following.   Nutrition Problem: Increased nutrient needs Etiology: acute illness(COVID-19 PNA)      DVT prophylaxis:Lovenox Code Status: Full Family Communication: None present at bedside Status is: Inpatient  Remains inpatient appropriate because:Unsafe d/c plan   Dispo: The patient is from: Home              Anticipated d/c is to: LTAC              Anticipated d/c date is: 3 days              Patient currently is not medically stable to d/c.    Consultants: PCCM  Events: 5/10 admitted [facility]  5/11 failed HHFNC, BiPAP.  Required intubation 5/12 transferred to [facility], evaluated by opthal for proptosis- no intervention required 5/17 Started paralytics, ECMO team consulted Not a candidate 5/19 Started proning 5/23 Off pressors 5/26 Proning stopped, continue paralytics 5/27 New tongue wound 2/2 bite block in place, weaned off ketamine. Daily fevers 5/28 Trach, stop paralytics 6/1 Out of airborne isolation, family visiting, on PSV weans  Procedures: ETT 5/11 >> 5/28 L femoral CVC 5/11 >> 5/17 R radial art line 5/17 >> 5/28 L IJ CVC 5/17 >> 6/1 Trach 5/28   Antimicrobials:  Anti-infectives (From admission, onward)   Start     Dose/Rate Route Frequency Ordered Stop   12/21/19 0900  vancomycin (VANCOREADY) IVPB 2000 mg/400 mL     2,000 mg 200 mL/hr over 120 Minutes Intravenous Every 24 hours 12/20/19 0845 12/24/19 1156   12/20/19 0900  vancomycin (VANCOCIN) 2,500 mg in sodium chloride 0.9 %  500 mL IVPB     2,500 mg 250 mL/hr over 120 Minutes Intravenous  Once 12/20/19 0845 12/20/19 1237   12/14/19 0000  piperacillin-tazobactam (ZOSYN) IVPB 3.375 g     3.375 g 12.5 mL/hr over 240 Minutes Intravenous Every 8 hours 12/13/19 1652 12/19/19 1953   12/13/19 1700  piperacillin-tazobactam (ZOSYN) IVPB 3.375 g     3.375 g 100 mL/hr over 30 Minutes Intravenous  Once 12/13/19 1651 12/13/19 1741   12/10/19 0100  ceFEPIme (MAXIPIME) 2 g in sodium chloride 0.9 % 100 mL IVPB   Status:  Discontinued     2 g 200 mL/hr over 30 Minutes Intravenous Every 8 hours 12/09/19 2044 12/12/19 0942   12/09/19 2200  ceftaroline (TEFLARO) 600 mg in sodium chloride 0.9 % 250 mL IVPB  Status:  Discontinued     600 mg 250 mL/hr over 60 Minutes Intravenous Every 12 hours 12/09/19 2023 12/09/19 2024   12/09/19 2200  ceftaroline (TEFLARO) 600 mg in sodium chloride 0.9 % 250 mL IVPB  Status:  Discontinued    Note to Pharmacy: Pharmacy may modify dose for renal failure.   600 mg 250 mL/hr over 60 Minutes Intravenous Every 12 hours 12/09/19 2024 12/09/19 2044   12/09/19 2130  linezolid (ZYVOX) IVPB 600 mg     600 mg 300 mL/hr over 60 Minutes Intravenous Every 12 hours 12/09/19 2044 12/15/19 2349   12/09/19 0900  ceFEPIme (MAXIPIME) 2 g in sodium chloride 0.9 % 100 mL IVPB  Status:  Discontinued     2 g 200 mL/hr over 30 Minutes Intravenous Every 8 hours 12/09/19 0836 12/09/19 2023   12/05/19 1000  remdesivir 100 mg in sodium chloride 0.9 % 100 mL IVPB     100 mg 200 mL/hr over 30 Minutes Intravenous Daily 12/04/19 1417 12/06/19 1003      Subjective:  Patient seen and examined at the bedside this morning.  Hemodynamically  stable.  Comfortable, no active issues.  Objective: Vitals:   12/29/19 0600 12/29/19 0800 12/29/19 0829 12/29/19 0900  BP: 109/65 137/80  137/78  Pulse: 96 94  100  Resp: 15 (!) 31  (!) 24  Temp:      TempSrc:      SpO2: 91% 99% 95% 95%  Weight:      Height:        Intake/Output Summary (Last 24 hours) at 12/29/2019 1053 Last data filed at 12/29/2019 1000 Gross per 24 hour  Intake 3600 ml  Output 3750 ml  Net -150 ml   Filed Weights   12/27/19 0442 12/28/19 0426 12/29/19 0453  Weight: (!) 173.8 kg (!) 171.4 kg (!) 171.9 kg    Examination:  General exam: Comfortable, morbidly obese HEENT: Trach, feeding tube Respiratory system: Bilateral equal air entry, normal vesicular breath sounds, no wheezes or crackles  Cardiovascular system: S1 & S2  heard, RRR. No JVD, murmurs, rubs, gallops or clicks. Gastrointestinal system: Abdomen is nondistended, soft and nontender. No organomegaly or masses felt. Normal bowel sounds heard. Central nervous system: Alert and oriented. No focal neurological deficits. Extremities: No edema, no clubbing ,no cyanosis Skin: No rashes, lesions or ulcers,no icterus ,no pallor   Data Reviewed: I have personally reviewed following labs and imaging studies  CBC: Recent Labs  Lab 12/24/19 0416 12/24/19 0416 12/25/19 0345 12/26/19 0241 12/27/19 0526 12/28/19 0456 12/29/19 0302  WBC 9.1   < > 8.2 7.8 7.3 7.2 7.9  NEUTROABS 6.8  --   --   --   --   --  5.0  HGB 8.3*   < > 8.2* 7.8* 8.4* 7.7* 8.1*  HCT 29.5*   < > 29.4* 27.7* 30.6* 27.6* 28.3*  MCV 108.1*   < > 108.9* 107.8* 108.1* 106.6* 104.4*  PLT 141*   < > 148* 156 167 155 151   < > = values in this interval not displayed.   Basic Metabolic Panel: Recent Labs  Lab 12/24/19 0416 12/24/19 0416 12/25/19 0345 12/26/19 0241 12/27/19 0526 12/28/19 0456 12/29/19 0302  NA 146*   < > 149* 148* 149* 145 145  K 3.5   < > 3.4* 3.5 3.7 3.4* 3.6  CL 106   < > 108 108 105 104 101  CO2 30   < > 30 29 32 30 31  GLUCOSE 172*   < > 178* 144* 157* 149* 121*  BUN 48*   < > 50* 45* 35* 37* 34*  CREATININE 1.83*   < > 1.65* 1.46* 1.40* 1.50* 1.47*  CALCIUM 8.8*   < > 9.1 9.1 9.3 8.9 9.0  MG 2.2  --  2.1 2.0 2.0 1.9  --   PHOS  --   --  4.4 4.3 4.4 5.0* 5.2*   < > = values in this interval not displayed.   GFR: Estimated Creatinine Clearance: 106.5 mL/min (A) (by C-G formula based on SCr of 1.47 mg/dL (H)). Liver Function Tests: Recent Labs  Lab 12/23/19 0447  AST 100*  ALT 104*  ALKPHOS 52  BILITOT 0.9  PROT 6.4*  ALBUMIN 2.0*   No results for input(s): LIPASE, AMYLASE in the last 168 hours. No results for input(s): AMMONIA in the last 168 hours. Coagulation Profile: No results for input(s): INR, PROTIME in the last 168 hours. Cardiac  Enzymes: No results for input(s): CKTOTAL, CKMB, CKMBINDEX, TROPONINI in the last 168 hours. BNP (last 3 results) No results for input(s): PROBNP in the last 8760 hours. HbA1C: No results for input(s): HGBA1C in the last 72 hours. CBG: Recent Labs  Lab 12/28/19 1558 12/28/19 2015 12/29/19 0000 12/29/19 0434 12/29/19 0808  GLUCAP 107* 112* 124* 122* 132*   Lipid Profile: No results for input(s): CHOL, HDL, LDLCALC, TRIG, CHOLHDL, LDLDIRECT in the last 72 hours. Thyroid Function Tests: No results for input(s): TSH, T4TOTAL, FREET4, T3FREE, THYROIDAB in the last 72 hours. Anemia Panel: No results for input(s): VITAMINB12, FOLATE, FERRITIN, TIBC, IRON, RETICCTPCT in the last 72 hours. Sepsis Labs: Recent Labs  Lab 12/24/19 0852 12/25/19 0345 12/26/19 0241  PROCALCITON 1.89 1.05 0.70    Recent Results (from the past 240 hour(s))  Fungus Culture With Stain     Status: None (Preliminary result)   Collection Time: 12/20/19 12:38 PM   Specimen: Bronchial Alveolar Lavage  Result Value Ref Range Status   Fungus Stain Final report  Final    Comment: (NOTE) Performed At: [facility] [place], [place] [id number] Rush Farmer MD ZL:[id number]    Fungus (Mycology) Culture PENDING  Incomplete   Fungal Source BRONCHIAL ALVEOLAR LAVAGE  Final  Culture, bal-quantitative     Status: Abnormal   Collection Time: 12/20/19 12:38 PM   Specimen: Bronchoalveolar Lavage  Result Value Ref Range Status   Specimen Description BRONCHIAL ALVEOLAR LAVAGE  Final   Special Requests NONE  Final   Gram Stain   Final    RARE WBC PRESENT, PREDOMINANTLY PMN NO ORGANISMS SEEN Performed at [facility], [place] [address] [place], [place] [address]    Culture (A)  Final  7,000 COLONIES/mL PROTEUS MIRABILIS 5,000 COLONIES/mL ENTEROCOCCUS FAECALIS 7,000 COLONIES/mL ACINETOBACTER CALCOACETICUS/BAUMANNII COMPLEX 1,000 COLONIES/mL PSEUDOMONAS AERUGINOSA    Report Status  12/24/2019 FINAL  Final   Organism ID, Bacteria ACINETOBACTER CALCOACETICUS/BAUMANNII COMPLEX (A)  Final   Organism ID, Bacteria PROTEUS MIRABILIS (A)  Final   Organism ID, Bacteria ENTEROCOCCUS FAECALIS (A)  Final   Organism ID, Bacteria PSEUDOMONAS AERUGINOSA (A)  Final      Susceptibility   Acinetobacter calcoaceticus/baumannii complex - MIC*    CEFTAZIDIME >=64 RESISTANT Resistant     CEFTRIAXONE >=64 RESISTANT Resistant     CIPROFLOXACIN >=4 RESISTANT Resistant     GENTAMICIN <=1 SENSITIVE Sensitive     IMIPENEM >=16 RESISTANT Resistant     PIP/TAZO >=128 RESISTANT Resistant     TRIMETH/SULFA <=20 SENSITIVE Sensitive     CEFEPIME >=64 RESISTANT Resistant     AMPICILLIN/SULBACTAM 4 SENSITIVE Sensitive     * 7,000 COLONIES/mL ACINETOBACTER CALCOACETICUS/BAUMANNII COMPLEX   Enterococcus faecalis - MIC*    AMPICILLIN <=2 SENSITIVE Sensitive     VANCOMYCIN 1 SENSITIVE Sensitive     GENTAMICIN SYNERGY SENSITIVE Sensitive     * 5,000 COLONIES/mL ENTEROCOCCUS FAECALIS   Pseudomonas aeruginosa - MIC*    CEFTAZIDIME 4 SENSITIVE Sensitive     CIPROFLOXACIN >=4 RESISTANT Resistant     GENTAMICIN >=16 RESISTANT Resistant     IMIPENEM >=16 RESISTANT Resistant     PIP/TAZO 8 SENSITIVE Sensitive     CEFEPIME 4 SENSITIVE Sensitive     * 1,000 COLONIES/mL PSEUDOMONAS AERUGINOSA   Proteus mirabilis - MIC*    AMPICILLIN <=2 SENSITIVE Sensitive     CEFAZOLIN <=4 SENSITIVE Sensitive     CEFEPIME <=1 SENSITIVE Sensitive     CEFTAZIDIME <=1 SENSITIVE Sensitive     CEFTRIAXONE <=1 SENSITIVE Sensitive     CIPROFLOXACIN <=0.25 SENSITIVE Sensitive     GENTAMICIN <=1 SENSITIVE Sensitive     IMIPENEM 2 SENSITIVE Sensitive     TRIMETH/SULFA <=20 SENSITIVE Sensitive     AMPICILLIN/SULBACTAM <=2 SENSITIVE Sensitive     PIP/TAZO <=4 SENSITIVE Sensitive     * 7,000 COLONIES/mL PROTEUS MIRABILIS  Fungus Culture Result     Status: None   Collection Time: 12/20/19 12:38 PM  Result Value Ref Range Status     Result 1 Comment  Final    Comment: (NOTE) KOH/Calcofluor preparation:  no fungus observed. Performed At: [facility] [place], [place] [id number] Rush Farmer MD [id number]   Culture, respiratory (non-expectorated)     Status: None   Collection Time: 12/24/19  9:23 AM   Specimen: Tracheal Aspirate; Respiratory  Result Value Ref Range Status   Specimen Description TRACHEAL ASPIRATE  Final   Special Requests Normal  Final   Gram Stain   Final    ABUNDANT WBC PRESENT,BOTH PMN AND MONONUCLEAR ABUNDANT GRAM POSITIVE RODS Performed at [facility], [place] [address], [place], [place] [address]    Culture   Final    FEW PSEUDOMONAS AERUGINOSA FEW PROTEUS MIRABILIS    Report Status 12/26/2019 FINAL  Final   Organism ID, Bacteria PSEUDOMONAS AERUGINOSA  Final   Organism ID, Bacteria PROTEUS MIRABILIS  Final      Susceptibility   Pseudomonas aeruginosa - MIC*    CEFTAZIDIME 2 SENSITIVE Sensitive     CIPROFLOXACIN 2 INTERMEDIATE Intermediate     GENTAMICIN >=16 RESISTANT Resistant     IMIPENEM >=16 RESISTANT Resistant     PIP/TAZO <=4 SENSITIVE Sensitive     CEFEPIME <=  1 SENSITIVE Sensitive     * FEW PSEUDOMONAS AERUGINOSA   Proteus mirabilis - MIC*    AMPICILLIN <=2 SENSITIVE Sensitive     CEFAZOLIN <=4 SENSITIVE Sensitive     CEFEPIME <=1 SENSITIVE Sensitive     CEFTAZIDIME <=1 SENSITIVE Sensitive     CEFTRIAXONE <=1 SENSITIVE Sensitive     CIPROFLOXACIN <=0.25 SENSITIVE Sensitive     GENTAMICIN <=1 SENSITIVE Sensitive     IMIPENEM 2 SENSITIVE Sensitive     TRIMETH/SULFA <=20 SENSITIVE Sensitive     AMPICILLIN/SULBACTAM <=2 SENSITIVE Sensitive     PIP/TAZO <=4 SENSITIVE Sensitive     * FEW PROTEUS MIRABILIS         Radiology Studies: DG Chest Port 1 View  Result Date: 12/28/2019 CLINICAL DATA:  Follow-up respiratory distress. EXAM: PORTABLE CHEST 1 VIEW COMPARISON:  12/26/2019 and older studies. FINDINGS: There is opacity in the left  perihilar and lower lung which is unchanged from the most recent prior exam. Right lung is clear. No convincing pleural effusion and no pneumothorax. Tracheostomy tube and enteric tube are stable. IMPRESSION: 1. No significant change from the most recent prior study. 2. Left perihilar and lower lung opacity, which may be due to atelectasis or infection. 3. Stable support apparatus. Electronically Signed   By: Lajean Manes M.D.   On: 12/28/2019 08:23        Scheduled Meds: . vitamin C  500 mg Per Tube Daily  . chlorhexidine gluconate (MEDLINE KIT)  15 mL Mouth Rinse BID  . Chlorhexidine Gluconate Cloth  6 each Topical Daily  . clonazePAM  2 mg Per Tube BID  . enoxaparin (LOVENOX) injection  0.5 mg/kg Subcutaneous Q24H  . famotidine  20 mg Per Tube BID  . feeding supplement (PRO-STAT SUGAR FREE 64)  60 mL Per Tube TID  . fentaNYL (SUBLIMAZE) injection  50 mcg Intravenous Once  . free water  400 mL Per Tube Q4H  . furosemide  40 mg Intravenous Daily  . insulin aspart  0-20 Units Subcutaneous Q4H  . insulin detemir  5 Units Subcutaneous BID  . mouth rinse  15 mL Mouth Rinse 10 times per day  . oxyCODONE  10 mg Per Tube Q4H  . potassium chloride  40 mEq Oral Q2H  . QUEtiapine  50 mg Per Tube BID  . sodium chloride flush  10-40 mL Intracatheter Q12H  . zinc sulfate  220 mg Per Tube Daily   Continuous Infusions: . sodium chloride 11 mL/hr at 12/25/19 0341  . sodium chloride 10 mL/hr at 12/28/19 0600  . feeding supplement (VITAL 1.5 CAL) 50 mL/hr at 12/27/19 1800     LOS: 25 days    Time spent:35 mins, More than 50% of that time was spent in counseling and/or coordination of care.      Shelly Coss, MD [facility] P6/12/2019, 10:53 AM

## 2019-12-30 LAB — GLUCOSE, CAPILLARY
Glucose-Capillary: 136 mg/dL — ABNORMAL HIGH (ref 70–99)
Glucose-Capillary: 122 mg/dL — ABNORMAL HIGH (ref 70–99)
Glucose-Capillary: 159 mg/dL — ABNORMAL HIGH (ref 70–99)
Glucose-Capillary: 117 mg/dL — ABNORMAL HIGH (ref 70–99)
Glucose-Capillary: 124 mg/dL — ABNORMAL HIGH (ref 70–99)

## 2019-12-30 LAB — CBC WITH DIFFERENTIAL/PLATELET
Abs Immature Granulocytes: 0.06 10*3/uL (ref 0.00–0.07)
Basophils Absolute: 0 10*3/uL (ref 0.0–0.1)
Basophils Relative: 0 %
Eosinophils Absolute: 0.5 10*3/uL (ref 0.0–0.5)
Eosinophils Relative: 5 %
HCT: 29 % — ABNORMAL LOW (ref 39.0–52.0)
Hemoglobin: 8.4 g/dL — ABNORMAL LOW (ref 13.0–17.0)
Immature Granulocytes: 1 %
Lymphocytes Relative: 18 %
Lymphs Abs: 1.7 10*3/uL (ref 0.7–4.0)
MCH: 30 pg (ref 26.0–34.0)
MCHC: 29 g/dL — ABNORMAL LOW (ref 30.0–36.0)
MCV: 103.6 fL — ABNORMAL HIGH (ref 80.0–100.0)
Monocytes Absolute: 0.5 10*3/uL (ref 0.1–1.0)
Monocytes Relative: 5 %
Neutro Abs: 6.8 10*3/uL (ref 1.7–7.7)
Neutrophils Relative %: 71 %
Platelets: 149 10*3/uL — ABNORMAL LOW (ref 150–400)
RBC: 2.8 MIL/uL — ABNORMAL LOW (ref 4.22–5.81)
RDW: 15.9 % — ABNORMAL HIGH (ref 11.5–15.5)
WBC: 9.5 10*3/uL (ref 4.0–10.5)
nRBC: 0 % (ref 0.0–0.2)

## 2019-12-30 MED ORDER — SODIUM CHLORIDE 3 % IN NEBU
4.0000 mL | INHALATION_SOLUTION | Freq: Two times a day (BID) | RESPIRATORY_TRACT | Status: AC
  Administered 2019-12-30 – 2020-01-02 (×5): 4 mL via RESPIRATORY_TRACT
  Filled 2019-12-30 (×6): qty 4

## 2019-12-30 MED ORDER — JUVEN PO PACK
1.0000 | PACK | Freq: Two times a day (BID) | ORAL | Status: DC
  Administered 2019-12-30 – 2020-01-09 (×20): 1
  Filled 2019-12-30 (×20): qty 1

## 2019-12-30 MED ORDER — METOLAZONE 5 MG PO TABS
5.0000 mg | ORAL_TABLET | Freq: Once | ORAL | Status: AC
  Administered 2019-12-30: 5 mg via ORAL
  Filled 2019-12-30 (×2): qty 1

## 2019-12-30 MED ORDER — VITAL 1.5 CAL PO LIQD
1000.0000 mL | ORAL | Status: DC
  Administered 2019-12-30 – 2020-01-09 (×9): 1000 mL
  Filled 2019-12-30 (×16): qty 1000

## 2019-12-30 NOTE — Progress Notes (Signed)
PROGRESS NOTE    Clifford Nichols  UXN:[id number] DOB: 03-22-1981 DOA: 12/04/2019 PCP: [facility] [place]   Brief Narrative: Patient is a 39 year old with history of hypertension, diabetes type 2, morbid obesity who was admitted with acute respiratory failure from Covid pneumonia on 5/10 at [facility].  He failed high flow nasal cannula, BiPAP.  He developed ARDS and was intubated.  He was transferred to [facility] for evaluation by ophthalmology for proptosis, no intervention required.  He has been hemodynamically stable on vent.  Hospital course was prolonged due to persistent respiratory failure, status post trach.  PCCM following.  Patient transferred to [facility] service on 12/28/2019.  Current plan is to move to LTAC.  Currently not on isolation.  Assessment & Plan:   Active Problems:   HTN (hypertension)   Acute respiratory failure due to COVID-19 ([place])   Hyperglycemia   Diabetes ([place])   COVID-19   ARDS (adult respiratory distress syndrome) (HCC)   Acute respiratory failure (HCC)   Acute on chronic respiratory failure with hypoxia and hypercapnia (HCC)   Pressure injury of skin   Severe ARDS secondary to Covid pneumonia: Status post trach.  Is on vent.  Continue PSV trials.  Follow intermittent chest x-ray.  Currently on diuresis with lasix. Status post PEG placement.  Fever: Found to have multiple bacteria on BAL.  Case was discussed with ID, recommended to observe off antibiotics.  Hypernatremia: Continue free water.  Continue to monitor BMP  Normocytic anemia: Hemoglobin in the range of 7.  Anemia is most likely associated with critical illness.  Continue to monitor.  AKI: Currently stable  Morbid obesity: BMI of 59.1.  Monitor possible OSA.  Diabetes type 2: On sliding scale insulin and Levemir.  Continue to monitor CBGs  Disposition: Prolonged hospitalization.Initially recommended CIR by PT but current plan is to move him to LTAC.  Social worker closely  following.   Pressure Injury 12/19/19 Other (Comment) Left Deep Tissue Pressure Injury - Purple or maroon localized area of discolored intact skin or blood-filled blister due to damage of underlying soft tissue from pressure and/or shear. DTI tongue (Active)  12/19/19 0315  Location: Other (Comment) (toungue)  Location Orientation: Left  Staging: Deep Tissue Pressure Injury - Purple or maroon localized area of discolored intact skin or blood-filled blister due to damage of underlying soft tissue from pressure and/or shear.  Wound Description (Comments): DTI tongue  Present on Admission: No     Pressure Injury 12/26/19 Sacrum Mid Stage 2 -  Partial thickness loss of dermis presenting as a shallow open injury with a red, pink wound bed without slough. (Active)  12/26/19 1300  Location: Sacrum  Location Orientation: Mid  Staging: Stage 2 -  Partial thickness loss of dermis presenting as a shallow open injury with a red, pink wound bed without slough.  Wound Description (Comments):   Present on Admission: No     Pressure Injury 12/26/19 Ear Right Stage 1 -  Intact skin with non-blanchable redness of a localized area usually over a bony prominence. (Active)  12/26/19 2000  Location: Ear  Location Orientation: Right  Staging: Stage 1 -  Intact skin with non-blanchable redness of a localized area usually over a bony prominence.  Wound Description (Comments):   Present on Admission: No        Nutrition Problem: Increased nutrient needs Etiology: acute illness(COVID-19 PNA)      DVT prophylaxis:Lovenox Code Status: Full Family Communication: None present at bedside Status  is: Inpatient  Remains inpatient appropriate because:Unsafe d/c plan   Dispo: The patient is from: Home              Anticipated d/c is to: LTAC              Anticipated d/c date is: Unknown              Patient currently is stable for discharge to LTAC. He has a bed at [facility] but family doesn't like  [facility].SW trying for [facility]   Consultants: PCCM  Events: 5/10 admitted [facility]  5/11 failed HHFNC, BiPAP.  Required intubation 5/12 transferred to [facility], evaluated by opthal for proptosis- no intervention required 5/17 Started paralytics, ECMO team consulted Not a candidate 5/19 Started proning 5/23 Off pressors 5/26 Proning stopped, continue paralytics 5/27 New tongue wound 2/2 bite block in place, weaned off ketamine. Daily fevers 5/28 Trach, stop paralytics 6/1 Out of airborne isolation, family visiting, on PSV weans  Procedures: ETT 5/11 >> 5/28 L femoral CVC 5/11 >> 5/17 R radial art line 5/17 >> 5/28 L IJ CVC 5/17 >> 6/1 Trach 5/28   Antimicrobials:  Anti-infectives (From admission, onward)   Start     Dose/Rate Route Frequency Ordered Stop   12/21/19 0900  vancomycin (VANCOREADY) IVPB 2000 mg/400 mL     2,000 mg 200 mL/hr over 120 Minutes Intravenous Every 24 hours 12/20/19 0845 12/24/19 1156   12/20/19 0900  vancomycin (VANCOCIN) 2,500 mg in sodium chloride 0.9 % 500 mL IVPB     2,500 mg 250 mL/hr over 120 Minutes Intravenous  Once 12/20/19 0845 12/20/19 1237   12/14/19 0000  piperacillin-tazobactam (ZOSYN) IVPB 3.375 g     3.375 g 12.5 mL/hr over 240 Minutes Intravenous Every 8 hours 12/13/19 1652 12/19/19 1953   12/13/19 1700  piperacillin-tazobactam (ZOSYN) IVPB 3.375 g     3.375 g 100 mL/hr over 30 Minutes Intravenous  Once 12/13/19 1651 12/13/19 1741   12/10/19 0100  ceFEPIme (MAXIPIME) 2 g in sodium chloride 0.9 % 100 mL IVPB  Status:  Discontinued     2 g 200 mL/hr over 30 Minutes Intravenous Every 8 hours 12/09/19 2044 12/12/19 0942   12/09/19 2200  ceftaroline (TEFLARO) 600 mg in sodium chloride 0.9 % 250 mL IVPB  Status:  Discontinued     600 mg 250 mL/hr over 60 Minutes Intravenous Every 12 hours 12/09/19 2023 12/09/19 2024   12/09/19 2200  ceftaroline (TEFLARO) 600 mg in sodium chloride 0.9 % 250 mL IVPB  Status:  Discontinued    Note to Pharmacy:  Pharmacy may modify dose for renal failure.   600 mg 250 mL/hr over 60 Minutes Intravenous Every 12 hours 12/09/19 2024 12/09/19 2044   12/09/19 2130  linezolid (ZYVOX) IVPB 600 mg     600 mg 300 mL/hr over 60 Minutes Intravenous Every 12 hours 12/09/19 2044 12/15/19 2349   12/09/19 0900  ceFEPIme (MAXIPIME) 2 g in sodium chloride 0.9 % 100 mL IVPB  Status:  Discontinued     2 g 200 mL/hr over 30 Minutes Intravenous Every 8 hours 12/09/19 0836 12/09/19 2023   12/05/19 1000  remdesivir 100 mg in sodium chloride 0.9 % 100 mL IVPB     100 mg 200 mL/hr over 30 Minutes Intravenous Daily 12/04/19 1417 12/06/19 1003      Subjective:  Patient seen and examined at the bedside this morning.  Hemodynamically stable.  Comfortable was having some cough during my evaluation and had large  amount of mucus output through his trach. Objective: Vitals:   12/30/19 0500 12/30/19 0600 12/30/19 0700 12/30/19 0800  BP: 129/84 138/81 (!) 142/116 122/77  Pulse: (!) 101 100 (!) 102 99  Resp: (!) 35 (!) 21 (!) 31 (!) 31  Temp:      TempSrc:      SpO2: 96% 96% 100% 96%  Weight:      Height:        Intake/Output Summary (Last 24 hours) at 12/30/2019 0809 Last data filed at 12/30/2019 0600 Gross per 24 hour  Intake 3600 ml  Output 5325 ml  Net -1725 ml   Filed Weights   12/28/19 0426 12/29/19 0453 12/30/19 0416  Weight: (!) 171.4 kg (!) 171.9 kg (!) 167.8 kg    Examination:   General exam: Comfortable, morbidly obese Carol HEENT: Trach,feeding tube Respiratory system: Bilateral equal air entry, normal vesicular breath sounds, no wheezes or crackles  Cardiovascular system: S1 & S2 heard, RRR. No JVD, murmurs, rubs, gallops or clicks. Gastrointestinal system: Abdomen is nondistended, soft and nontender. No organomegaly or masses felt. Normal bowel sounds heard. Central nervous system: Alert and oriented. No focal neurological deficits. Extremities: No edema, no clubbing ,no cyanosis Skin: No rashes,  lesions or ulcers,no icterus ,no pallor   Data Reviewed: I have personally reviewed following labs and imaging studies  CBC: Recent Labs  Lab 12/24/19 0416 12/25/19 0345 12/26/19 0241 12/27/19 0526 12/28/19 0456 12/29/19 0302 12/30/19 0009  WBC 9.1   < > 7.8 7.3 7.2 7.9 9.5  NEUTROABS 6.8  --   --   --   --  5.0 6.8  HGB 8.3*   < > 7.8* 8.4* 7.7* 8.1* 8.4*  HCT 29.5*   < > 27.7* 30.6* 27.6* 28.3* 29.0*  MCV 108.1*   < > 107.8* 108.1* 106.6* 104.4* 103.6*  PLT 141*   < > 156 167 155 151 149*   < > = values in this interval not displayed.   Basic Metabolic Panel: Recent Labs  Lab 12/24/19 0416 12/24/19 0416 12/25/19 0345 12/26/19 0241 12/27/19 0526 12/28/19 0456 12/29/19 0302  NA 146*   < > 149* 148* 149* 145 145  K 3.5   < > 3.4* 3.5 3.7 3.4* 3.6  CL 106   < > 108 108 105 104 101  CO2 30   < > 30 29 32 30 31  GLUCOSE 172*   < > 178* 144* 157* 149* 121*  BUN 48*   < > 50* 45* 35* 37* 34*  CREATININE 1.83*   < > 1.65* 1.46* 1.40* 1.50* 1.47*  CALCIUM 8.8*   < > 9.1 9.1 9.3 8.9 9.0  MG 2.2  --  2.1 2.0 2.0 1.9  --   PHOS  --   --  4.4 4.3 4.4 5.0* 5.2*   < > = values in this interval not displayed.   GFR: Estimated Creatinine Clearance: 104.9 mL/min (A) (by C-G formula based on SCr of 1.47 mg/dL (H)). Liver Function Tests: No results for input(s): AST, ALT, ALKPHOS, BILITOT, PROT, ALBUMIN in the last 168 hours. No results for input(s): LIPASE, AMYLASE in the last 168 hours. No results for input(s): AMMONIA in the last 168 hours. Coagulation Profile: No results for input(s): INR, PROTIME in the last 168 hours. Cardiac Enzymes: No results for input(s): CKTOTAL, CKMB, CKMBINDEX, TROPONINI in the last 168 hours. BNP (last 3 results) No results for input(s): PROBNP in the last 8760 hours. HbA1C: No results for input(s):  HGBA1C in the last 72 hours. CBG: Recent Labs  Lab 12/29/19 1645 12/29/19 1934 12/29/19 2259 12/30/19 0410 12/30/19 0751  GLUCAP 122* 102*  138* 136* 122*   Lipid Profile: No results for input(s): CHOL, HDL, LDLCALC, TRIG, CHOLHDL, LDLDIRECT in the last 72 hours. Thyroid Function Tests: No results for input(s): TSH, T4TOTAL, FREET4, T3FREE, THYROIDAB in the last 72 hours. Anemia Panel: No results for input(s): VITAMINB12, FOLATE, FERRITIN, TIBC, IRON, RETICCTPCT in the last 72 hours. Sepsis Labs: Recent Labs  Lab 12/24/19 0852 12/25/19 0345 12/26/19 0241  PROCALCITON 1.89 1.05 0.70    Recent Results (from the past 240 hour(s))  Fungus Culture With Stain     Status: None (Preliminary result)   Collection Time: 12/20/19 12:38 PM   Specimen: Bronchial Alveolar Lavage  Result Value Ref Range Status   Fungus Stain Final report  Final    Comment: (NOTE) Performed At: [facility] [place], [place] [id number] Rush Farmer MD SU:[id number]    Fungus (Mycology) Culture PENDING  Incomplete   Fungal Source BRONCHIAL ALVEOLAR LAVAGE  Final  Culture, bal-quantitative     Status: Abnormal   Collection Time: 12/20/19 12:38 PM   Specimen: Bronchoalveolar Lavage  Result Value Ref Range Status   Specimen Description BRONCHIAL ALVEOLAR LAVAGE  Final   Special Requests NONE  Final   Gram Stain   Final    RARE WBC PRESENT, PREDOMINANTLY PMN NO ORGANISMS SEEN Performed at [facility], [place] [address] [place], [place] [address]    Culture (A)  Final    7,000 COLONIES/mL PROTEUS MIRABILIS 5,000 COLONIES/mL ENTEROCOCCUS FAECALIS 7,000 COLONIES/mL ACINETOBACTER CALCOACETICUS/BAUMANNII COMPLEX 1,000 COLONIES/mL PSEUDOMONAS AERUGINOSA    Report Status 12/24/2019 FINAL  Final   Organism ID, Bacteria ACINETOBACTER CALCOACETICUS/BAUMANNII COMPLEX (A)  Final   Organism ID, Bacteria PROTEUS MIRABILIS (A)  Final   Organism ID, Bacteria ENTEROCOCCUS FAECALIS (A)  Final   Organism ID, Bacteria PSEUDOMONAS AERUGINOSA (A)  Final      Susceptibility   Acinetobacter calcoaceticus/baumannii complex - MIC*     CEFTAZIDIME >=64 RESISTANT Resistant     CEFTRIAXONE >=64 RESISTANT Resistant     CIPROFLOXACIN >=4 RESISTANT Resistant     GENTAMICIN <=1 SENSITIVE Sensitive     IMIPENEM >=16 RESISTANT Resistant     PIP/TAZO >=128 RESISTANT Resistant     TRIMETH/SULFA <=20 SENSITIVE Sensitive     CEFEPIME >=64 RESISTANT Resistant     AMPICILLIN/SULBACTAM 4 SENSITIVE Sensitive     * 7,000 COLONIES/mL ACINETOBACTER CALCOACETICUS/BAUMANNII COMPLEX   Enterococcus faecalis - MIC*    AMPICILLIN <=2 SENSITIVE Sensitive     VANCOMYCIN 1 SENSITIVE Sensitive     GENTAMICIN SYNERGY SENSITIVE Sensitive     * 5,000 COLONIES/mL ENTEROCOCCUS FAECALIS   Pseudomonas aeruginosa - MIC*    CEFTAZIDIME 4 SENSITIVE Sensitive     CIPROFLOXACIN >=4 RESISTANT Resistant     GENTAMICIN >=16 RESISTANT Resistant     IMIPENEM >=16 RESISTANT Resistant     PIP/TAZO 8 SENSITIVE Sensitive     CEFEPIME 4 SENSITIVE Sensitive     * 1,000 COLONIES/mL PSEUDOMONAS AERUGINOSA   Proteus mirabilis - MIC*    AMPICILLIN <=2 SENSITIVE Sensitive     CEFAZOLIN <=4 SENSITIVE Sensitive     CEFEPIME <=1 SENSITIVE Sensitive     CEFTAZIDIME <=1 SENSITIVE Sensitive     CEFTRIAXONE <=1 SENSITIVE Sensitive     CIPROFLOXACIN <=0.25 SENSITIVE Sensitive     GENTAMICIN <=1 SENSITIVE Sensitive     IMIPENEM 2  SENSITIVE Sensitive     TRIMETH/SULFA <=20 SENSITIVE Sensitive     AMPICILLIN/SULBACTAM <=2 SENSITIVE Sensitive     PIP/TAZO <=4 SENSITIVE Sensitive     * 7,000 COLONIES/mL PROTEUS MIRABILIS  Fungus Culture Result     Status: None   Collection Time: 12/20/19 12:38 PM  Result Value Ref Range Status   Result 1 Comment  Final    Comment: (NOTE) KOH/Calcofluor preparation:  no fungus observed. Performed At: [facility] [place], [place] [id number] Rush Farmer MD ZJ:[id number]   Culture, respiratory (non-expectorated)     Status: None   Collection Time: 12/24/19  9:23 AM   Specimen: Tracheal Aspirate; Respiratory    Result Value Ref Range Status   Specimen Description TRACHEAL ASPIRATE  Final   Special Requests Normal  Final   Gram Stain   Final    ABUNDANT WBC PRESENT,BOTH PMN AND MONONUCLEAR ABUNDANT GRAM POSITIVE RODS Performed at [facility], [place] [address], [place], [place] [address]    Culture   Final    FEW PSEUDOMONAS AERUGINOSA FEW PROTEUS MIRABILIS    Report Status 12/26/2019 FINAL  Final   Organism ID, Bacteria PSEUDOMONAS AERUGINOSA  Final   Organism ID, Bacteria PROTEUS MIRABILIS  Final      Susceptibility   Pseudomonas aeruginosa - MIC*    CEFTAZIDIME 2 SENSITIVE Sensitive     CIPROFLOXACIN 2 INTERMEDIATE Intermediate     GENTAMICIN >=16 RESISTANT Resistant     IMIPENEM >=16 RESISTANT Resistant     PIP/TAZO <=4 SENSITIVE Sensitive     CEFEPIME <=1 SENSITIVE Sensitive     * FEW PSEUDOMONAS AERUGINOSA   Proteus mirabilis - MIC*    AMPICILLIN <=2 SENSITIVE Sensitive     CEFAZOLIN <=4 SENSITIVE Sensitive     CEFEPIME <=1 SENSITIVE Sensitive     CEFTAZIDIME <=1 SENSITIVE Sensitive     CEFTRIAXONE <=1 SENSITIVE Sensitive     CIPROFLOXACIN <=0.25 SENSITIVE Sensitive     GENTAMICIN <=1 SENSITIVE Sensitive     IMIPENEM 2 SENSITIVE Sensitive     TRIMETH/SULFA <=20 SENSITIVE Sensitive     AMPICILLIN/SULBACTAM <=2 SENSITIVE Sensitive     PIP/TAZO <=4 SENSITIVE Sensitive     * FEW PROTEUS MIRABILIS         Radiology Studies: No results found.      Scheduled Meds: . vitamin C  500 mg Per Tube Daily  . chlorhexidine gluconate (MEDLINE KIT)  15 mL Mouth Rinse BID  . Chlorhexidine Gluconate Cloth  6 each Topical Daily  . clonazePAM  2 mg Per Tube BID  . collagenase   Topical Daily  . enoxaparin (LOVENOX) injection  0.5 mg/kg Subcutaneous Q24H  . famotidine  20 mg Per Tube BID  . feeding supplement (PRO-STAT SUGAR FREE 64)  60 mL Per Tube TID  . fentaNYL (SUBLIMAZE) injection  50 mcg Intravenous Once  . free water  400 mL Per Tube Q4H  . furosemide  40 mg  Intravenous Daily  . insulin aspart  0-20 Units Subcutaneous Q4H  . insulin detemir  5 Units Subcutaneous BID  . mouth rinse  15 mL Mouth Rinse 10 times per day  . oxyCODONE  10 mg Per Tube Q4H  . QUEtiapine  50 mg Per Tube BID  . sodium chloride flush  10-40 mL Intracatheter Q12H  . zinc sulfate  220 mg Per Tube Daily   Continuous Infusions: . sodium chloride 11 mL/hr at 12/25/19 0341  . sodium chloride 10 mL/hr at  12/28/19 0600  . feeding supplement (VITAL 1.5 CAL) 1,000 mL (12/29/19 1801)     LOS: 26 days    Time spent:35 mins, More than 50% of that time was spent in counseling and/or coordination of care.      Shelly Coss, MD [facility] P6/01/2020, 8:09 AM

## 2019-12-30 NOTE — Progress Notes (Signed)
Physical Therapy Treatment Patient Details Name: Clifford Nichols MRN: [id number] DOB: February 19, 1981 Today's Date: 12/30/2019    History of Present Illness 39 year old obese man with hypertension, diabetes, admitted 12/02/19 to [facility] with acute respiratory failure, ARDS from COVID-19 pneumonia, initial positive test 5/10. Intubated 5/11, required paralytics, proning, pressors, tracheostomy 5/28; Head CT 5/12 no abnormality. Seen by opthalmology due to left eye proptosis (no intervention recommended). 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  Pt asked to stand and transfer to the chair without the lift.  Emphasis on transition to EOB with less assist, sit to standing x2.  Attempting to w/shift showed weaknesses in unilateral stance that curtailed pivot in favor of placing the chair behind today.    Follow Up Recommendations  CIR     Equipment Recommendations  Other (comment)(TBA)    Recommendations for Other Services       Precautions / Restrictions Precautions Precautions: Fall Precaution Comments: watch RR, HR, sats    Mobility  Bed Mobility Overal bed mobility: Needs Assistance Bed Mobility: Rolling Rolling: Mod assist   Supine to sit: Mod assist     General bed mobility comments: pt using bil UE to pull up into sitting  Transfers Overall transfer level: Needs assistance Equipment used: Rolling walker (2 wheeled) Transfers: Sit to/from Stand Sit to Stand: +2 physical assistance;Mod assist         General transfer comment: pt (with elevated surface) able to power up into standing from bed x2. pt with chair placed behind him due to difficulty taking steps today.. pt asked to power up from chair and able to stand total +2 mod (A). pt sustaining static standing  Ambulation/Gait                 Stairs             Wheelchair Mobility    Modified Rankin (Stroke Patients Only)        Balance Overall balance assessment: Needs assistance Sitting-balance support: Bilateral upper extremity supported;Feet supported Sitting balance-Leahy Scale: Fair     Standing balance support: Bilateral upper extremity supported;During functional activity Standing balance-Leahy Scale: Poor Standing balance comment: reliant on External support from therapist and RW                            Cognition Arousal/Alertness: Awake/alert Behavior During Therapy: WFL for tasks assessed/performed Overall Cognitive Status: Within Functional Limits for tasks assessed                                        Exercises General Exercises - Lower Extremity Ankle Circles/Pumps: AROM;Both;10 reps;Seated Quad Sets: AROM;Both;5 reps;Seated Gluteal Sets: AROM;Both;5 reps;Seated Heel Slides: AROM;Strengthening;Both;10 reps;Supine(heavy resistance in gross extention) Hip ABduction/ADduction: (educated to do in chair ) Straight Leg Raises: Other (comment)(educated to do in cahir ) Other Exercises Other Exercises: pt asked to have theraband. pt educaetd to do the above leg exercises in chair first and we will progress him. pt states okay  Other Exercises: pt listening to tupac for his efforts     General Comments General comments (skin integrity, edema, etc.): VSS-discussed CIR recommendation wtih patient. wife and mother arriving and agreeable      Pertinent Vitals/Pain Pain Assessment: No/denies pain    Home  Living                      Prior Function            PT Goals (current goals can now be found in the care plan section) Acute Rehab PT Goals Patient Stated Goal: to do leg exercises PT Goal Formulation: With patient Time For Goal Achievement: 01/06/20 Potential to Achieve Goals: Good Progress towards PT goals: Progressing toward goals    Frequency    Min 4X/week      PT Plan Discharge plan needs to be updated    Co-evaluation   Reason  for Co-Treatment: Complexity of the patient's impairments (multi-system involvement);Necessary to address cognition/behavior during functional activity;For patient/therapist safety;To address functional/ADL transfers PT goals addressed during session: Mobility/safety with mobility OT goals addressed during session: ADL's and self-care      AM-PAC PT "6 Clicks" Mobility   Outcome Measure  Help needed turning from your back to your side while in a flat bed without using bedrails?: Total Help needed moving from lying on your back to sitting on the side of a flat bed without using bedrails?: Total Help needed moving to and from a bed to a chair (including a wheelchair)?: Total Help needed standing up from a chair using your arms (e.g., wheelchair or bedside chair)?: Total Help needed to walk in hospital room?: Total Help needed climbing 3-5 steps with a railing? : Total 6 Click Score: 6    End of Session Equipment Utilized During Treatment: Oxygen Activity Tolerance: Patient tolerated treatment well Patient left: in chair;with call bell/phone within reach;with family/visitor present Nurse Communication: Mobility status;Need for lift equipment;Other (comment) PT Visit Diagnosis: Muscle weakness (generalized) (M62.81);Other symptoms and signs involving the nervous system (R29.898);Difficulty in walking, not elsewhere classified (R26.2)     Time: 1540-1610 PT Time Calculation (min) (ACUTE ONLY): 30 min  Charges:  $Therapeutic Activity: 8-22 mins                     12/30/2019  Jacinto Halim., PT Acute Rehabilitation Services [phone]  (pager) [phone]  (office)   Eliseo Gum Bianney [place] 12/30/2019, 6:10 PM

## 2019-12-30 NOTE — Progress Notes (Addendum)
12:30pm: CSW spoke with patient's wife who confirmed that she wants to proceed with placement at [facility].  CSW spoke with Leotis Shames at [facility] who will initiate insurance authorization.  12pm: CSW spoke with Dr. Renford Dills regarding this patient's discharge plan and the family's hesitancy to send him to [facility].  CSW spoke with Corina at [facility] who states she is unable to make a bed offer due to the patient's weight and lack of bed availability.   CSW spoke with patient's wife Carollee Leitz and mother Mitzi Davenport to explain to her that [facility] has a bed for the patient. Carollee Leitz and [place] to discuss [facility] once again and return call to CSW with a decision.  8:20am: CSW spoke with Lauren at [facility] regarding patient - per Leotis Shames, the conversation went well this weekend with the patient's wife and she was receptive to placement at [facility]. Lauren states she will speak with the patient's wife once more to obtain consent to initiate insurance authorization this morning.  Lauren to return call to CSW once final approval is obtained.  Edwin Dada, MSW, LCSW-A Transitions of Care  Clinical Social Worker  [facility] Emergency Departments  Medical ICU [phone]

## 2019-12-30 NOTE — Progress Notes (Signed)
Assisted tele visit to patient with wife.  Adolphus Hanf D Danashia Landers, RN   

## 2019-12-30 NOTE — Progress Notes (Signed)
NAME:  Clifford Nichols, MRN:  [id number], DOB:  1981-03-16, LOS: 60 [facility] ADMISSION DATE:  12/04/2019, [facility] Admission DATE:  12/04/19 REFERRING MD:  Dr Mortimer Fries, CCM, CHIEF COMPLAINT:  Acute respiratory failure   Brief History   39 year old obese man with hypertension, diabetes admitted with acute respiratory failure, ARDS from COVID-19 pneumonia, initial positive test 5/10  Past Medical History    has a past medical history of Abscess of upper back excluding scapular region, Diabetes mellitus without complication ([place]), GERD (gastroesophageal reflux disease), Hypertension, and Sebaceous cyst.  Significant Hospital Events   5/10 admitted [facility]  5/11 failed HHFNC, BiPAP.  Required intubation 5/12 transferred to [facility], evaluated by opthal for proptosis- no intervention required 5/17 Started paralytics, ECMO team consulted Not a candidate 5/19 Started proning 5/23 Off pressors 5/26 Proning stopped, continue paralytics 5/27 New tongue wound 2/2 bite block in place, weaned off ketamine. Daily fevers 5/28 Trach, stop paralytics 6/1 Out of airborne isolation, family visiting, on PSV weans  Consults:  Ophthalmology for proptosis 5/12  Procedures:  ETT 5/11 >> 5/28 L femoral CVC 5/11 >> 5/17 R radial art line 5/17 >> 5/28 L IJ CVC 5/17 >> 6/1  Trach 5/28  Significant Diagnostic Tests:  Head CT 5/11 >> no significant intracranial abnormality.  Scattered opacification and air-fluid levels in the frontal, ethmoid, sphenoid and maxillary sinuses of unclear significance.  Head CT 5/12 >> no significant intracranial abnormality  Lower extremity Doppler 5/14 >> negative for DVT  Micro Data:  SARS CoV2 5/10 >> positive MRSA screen 5/12 >> negative Respiratory 5/12 >> negative Blood 5/17 >> Staph epi and Staph hominis Respiratory 5/17 >> Staph epi 5/24 resp cx bal: staph epi 5/24 BAL acid fast: smear negative cx pending 5/24 BAL fungal:pending 5/26 blood: No growth BAL 5/28-Acinetobacter,  Proteus, Pseudomonas, Enterococcus Trach asp 6/1- Pseudomonas  Antimicrobials:  Remdesivir 5/11 >> 5/15 Dexamethasone 5/11 >> 5/20 Tocilizumab 5/12 Cefepime 5/17 >> 5/21 Linezolid 5/17 >> 5/23 Zosyn 5/21 >>  5/29 Vanco 5/28 >> 6/1  Interim history/subjective:  Tmax 98.3 Tolerated ATC yesterday for 12 hrs, back on ATC this morning Denies SOB, c/o of back pain  Objective   Blood pressure 122/77, pulse 99, temperature 98.6 F (37 C), temperature source Oral, resp. rate (!) 31, height _0  (1.702 m), weight (!) 167.8 kg, SpO2 96 %.    Vent Mode: PRVC FiO2 (%):  [40 %] 40 % Set Rate:  [35 bmp] 35 bmp Vt Set:  [470 mL] 470 mL PEEP:  [8 cmH20] 8 cmH20 Plateau Pressure:  [18 cmH20-211 cmH20] 21 cmH20   Intake/Output Summary (Last 24 hours) at 12/30/2019 0831 Last data filed at 12/30/2019 0800 Gross per 24 hour  Intake 3700 ml  Output 5325 ml  Net -1625 ml   Filed Weights   12/28/19 0426 12/29/19 0453 12/30/19 0416  Weight: (!) 171.4 kg (!) 171.9 kg (!) 167.8 kg    Examination: General:  Morbidly obese male sitting upright in bed in NAD HEENT: MM pink/moist, midline 8 shiley XLT proximal trach Neuro: Awake, mouth words, f/c, MAE CV: rr PULM:  Non labored on ATC 35%, moderate amount of thick tan/yellow/ green secretions, strong cough, scattered rhonchi, diminished in bases GI: soft, bs active, NT Extremities: warm/dry, trace pedal edema  Skin: no rashes   6/5 CXR reviewed, stable  Labs reviewed, stable renal function, stable H/H, normal WBC, no fevers   Resolved Hospital Problem list    Left proptosis, noted with coughing early 5/12, resolved.  Apparently per wife he has episodic intermittent proptosis at home as well. Head CT 5/12 reassuring Ophthalmology did evaluate, recommended that he will need outpatient evaluation but no intervention at this time beyond keeping the eyes moist.  Septic shock, present on admission. Tongue necrosis secondary to biting  down Hypertriglyceridemia Acinetobacter HAP Acute toxic metabolic encephalopathy  Assessment & Plan:   Severe ARDS due to COVID-19 pneumonia  S/p trach  Morbid obesity, possible OSA.  Will certainly impact respiratory mechanics - full MV support prn, continue daily ATC trials - consider downsizing trach once on ATC > 24 hrs - trach care  - prn BDs - aggressive pulmonary hygiene/ mobilize as able   Remainder per primary.  Awaiting LTAC placement.  PCCM will continue to follow.    Best practice:  Diet: TF Pain/Anxiety/Delirium protocol (if indicated): Clonazepam, oxycodone, Seroquel VAP protocol (if indicated): per protocol DVT prophylaxis: enoxaparin GI prophylaxis: pepcid Glucose control: SSI protocol. Levemir Mobility: PT  Code Status: Full Family Communication: per primary  Disposition: ICU until liberated from MV     Kennieth Rad, MSN, AGACNP-BC [facility] 12/30/2019, 8:56 AM  See Shea Evans for personal pager PCCM on call pager [phone]

## 2019-12-30 NOTE — Progress Notes (Signed)
  Speech Language Pathology Treatment: Clifford Nichols Speaking valve  Patient Details Name: Clifford Nichols MRN: [id number] DOB: [birth year] Today's Date: 12/30/2019 Time: 0175-1025 SLP Time Calculation (min) (ACUTE ONLY): 24 min  Assessment / Plan / Recommendation Clinical Impression  Pt on trach collar during treatment with PMV. Cuff deflated and pt able to produce a strong cough ejecting mucous via trach and oral cavity for oral suctioning. His voice was audible for a few seconds x 2 but mucous would return which he could not manage. PMV placed following inhalation for more effective cough however secretions could not be cleared/mobilized for pt to vocalize again. Mucous thick and causing him to gag. RT provided deep suctioning extracting phlegm but only temporarily. SpO2 90-92%, RR 20-35. Air trapping noted when valve removed on one ocassion however his secretions are a barrier presently for phonation purposes and not significant this session to clear secretions. He was more awake and engaged this session. If/when he does not need vent, could he switch to cuffless and smaller trach. ST to continue.    HPI HPI: 39 year old obese man with hypertension, diabetes, admitted 12/02/19 to [facility] with acute respiratory failure, ARDS from COVID-19 pneumonia, initial positive test 5/10. Intubated 5/11, required paralytics, proning, pressors, tracheostomy 5/28; Head CT 5/12 no abnormality. Tongue necrosis due to pt biting down when orally intubated      SLP Plan  Continue with current plan of care       Recommendations         Patient may use Passy-Muir Speech Valve: with SLP only PMSV Supervision: Full MD: Please consider changing trach tube to : Smaller size;Cuffless         Oral Care Recommendations: Oral care QID Follow up Recommendations: LTACH SLP Visit Diagnosis: Aphonia (R49.1) Plan: Continue with current plan of care       GO                Clifford Nichols 12/30/2019, 3:47  PM  Clifford Nichols Clifford Nichols M.Ed Nurse, children's [phone] Office [phone]

## 2019-12-30 NOTE — Progress Notes (Signed)
Nutrition Follow-up  DOCUMENTATION CODES:   Morbid obesity  INTERVENTION:   Continue tube feeding via post-pyloric Cortrak tube: - Increase Vital 1.5to 72m/hr (1320 ml/day) - Continue Pro-stat 60 ml TID - Free water per MD, currently 400 ml q 4 hours  Provides:2580kcal, 179 grams protein, and 9151mfree water daily.  Total free water with TF plus free water flush of 400 mL every 4 hours: 3408 mL  - Add 1 packet Juven BID per tube, each packet provides 95 calories, 2.5 grams of protein, and 9.8 grams of carbohydrate; also contains 7 grams of L-arginine and L-glutamine, 300 mg vitamin C, 15 mg vitamin E, 1.2 mcg vitamin B-12, 9.5 mg zinc, 200 mg calcium, and 1.5 g  Calcium Beta-hydroxy-Beta-methylbutyrate to support wound healing  NUTRITION DIAGNOSIS:   Increased nutrient needs related to acute illness (COVID-19 PNA) as evidenced by estimated needs.  Ongoing, being addressed via TF  GOAL:   Patient will meet greater than or equal to 90% of their needs  Met via TF  MONITOR:   Weight trends, TF tolerance, Skin, I & O's, Labs  REASON FOR ASSESSMENT:   Consult, Ventilator Enteral/tube feeding initiation and management  ASSESSMENT:   Pt with PMH of HTN, DM, and morbid obesity admitted with acute respiratory failure/ARDS from COVID-19 PNA.  [facility] - required urgent int[facility]; ophthalmology for proptosis. Per notes, RN having to push eye back into orbit therefore unable to prone pt. 5/19 - started proning 2/20 - pt vomiting, TF coming out of mouth, TF on hold 5/21 - post-pyloric Cortrak placed 5/28 - trach placed 5/31 - transferred to 18M from 44M, no longer requiring COVID restrictions  Discussed patient in ICU rounds and with RN today. Pt now on indefinite trial of trach collar. Per RN, pt with stage III to sacrum. Will add Juven.  Weight trending down over the last week. Per RN edema assessment, pt with moderate  pitting generalized edema, moderate pitting edema to BUE, and moderate pitting edema to BLE.  Medications reviewed and include: vitamin C, Pepcid, Lasix, SSI q 4 hours, Levemir 5 units BID, zinc sulfate  Labs reviewed: phosphorus 5.2, hemoglobin 8.4 CBG's: 102-159 x 24 hours  UOP: 5325 ml x 24 hours  Diet Order:   Diet Order            Diet NPO time specified  Diet effective now              EDUCATION NEEDS:   No education needs have been identified at this time  Skin:  Skin Assessment: Skin Integrity Issues: DTI: left side of tongue Stage I: right ear Stage III: sacrum  Last BM:  12/29/19 type 6  Height:   Ht Readings from Last 1 Encounters:  12/14/19 5' 7"  (1.702 m)    Weight:   Wt Readings from Last 1 Encounters:  12/30/19 (!) 167.8 kg    Ideal Body Weight:  67.2 kg  BMI:  Body mass index is 57.94 kg/m.  Estimated Nutritional Needs:   Kcal:  2600-3000  Protein:  150-175 grams  Fluid:  2.4 L    KaGaynell FaceMS, RD, LDN Inpatient Clinical Dietitian Pager: [phone]/After Hours: [phone]

## 2019-12-30 NOTE — Progress Notes (Signed)
Occupational Therapy Treatment Patient Details Name: Clifford Nichols MRN: [id number] DOB: Oct 09, 1980 Today's Date: 12/30/2019    History of present illness 39 year old obese man with hypertension, diabetes, admitted 12/02/19 to [facility] with acute respiratory failure, ARDS from COVID-19 pneumonia, initial positive test 5/10. Intubated 5/11, required paralytics, proning, pressors, tracheostomy 5/28; Head CT 5/12 no abnormality. Seen by opthalmology due to left eye proptosis (no intervention recommended). Tongue necrosis due to pt biting down when orally intubated   OT comments  Pt progressing to sit<.>stand this session to progress to chair. Pt with recommendation update to CIR. Family arrivign and very agreeable to CIR. Pt motivated by his children. Pt enjoys tupac and wow in room used to play music to help enrich his environment.    Follow Up Recommendations  CIR    Equipment Recommendations  Wheelchair (measurements OT);Hospital bed;Wheelchair cushion (measurements OT)    Recommendations for Other Services Rehab consult    Precautions / Restrictions Precautions Precautions: Fall Precaution Comments: watch RR, HR, sats       Mobility Bed Mobility Overal bed mobility: Needs Assistance Bed Mobility: Rolling Rolling: Mod assist   Supine to sit: Mod assist     General bed mobility comments: pt using bil UE to pull up into sitting  Transfers Overall transfer level: Needs assistance Equipment used: Rolling walker (2 wheeled) Transfers: Sit to/from Stand Sit to Stand: +2 physical assistance;Mod assist         General transfer comment: pt with elevated surface able to power up into standing from bed x2. pt with chair placed. pt asked to power up from chair and able to stand total +2 mod (A). pt sustaining static standing    Balance Overall balance assessment: Needs assistance Sitting-balance support: Bilateral upper extremity supported;Feet supported Sitting balance-Leahy  Scale: Fair     Standing balance support: Bilateral upper extremity supported;During functional activity Standing balance-Leahy Scale: Poor Standing balance comment: reliant on External support from therapist and RW                           ADL either performed or assessed with clinical judgement   ADL Overall ADL's : Needs assistance/impaired Eating/Feeding: NPO   Grooming: Minimal assistance               Lower Body Dressing: Total assistance Lower Body Dressing Details (indicate cue type and reason): requires socks cut due to edema in feet               General ADL Comments: pt transferred to chair with sit<.stand. Pt completed sit<>stand x3 this session     Vision       Perception     Praxis      Cognition Arousal/Alertness: Awake/alert Behavior During Therapy: WFL for tasks assessed/performed Overall Cognitive Status: Within Functional Limits for tasks assessed                                          Exercises Exercises: Other exercises;General Lower Extremity General Exercises - Lower Extremity Ankle Circles/Pumps: AROM;Both;10 reps;Seated Quad Sets: AROM;Both;5 reps;Seated Gluteal Sets: AROM;Both;5 reps;Seated Hip ABduction/ADduction: (educated to do in chair ) Straight Leg Raises: Other (comment)(educated to do in cahir ) Other Exercises Other Exercises: pt asked to have theraband. pt educaetd to do the above leg exercises in chair first and we will progress him. pt  states okay  Other Exercises: pt listening to tupac for his efforts    Shoulder Instructions       General Comments VSS-discussed CIR recommendation wtih patient. wife and mother arriving and agreeable    Pertinent Vitals/ Pain       Pain Assessment: No/denies pain Faces Pain Scale: Hurts a little bit Pain Intervention(s): Monitored during session  Home Living                                          Prior  Functioning/Environment              Frequency  Min 2X/week        Progress Toward Goals  OT Goals(current goals can now be found in the care plan section)  Progress towards OT goals: Progressing toward goals  Acute Rehab OT Goals Patient Stated Goal: to do leg exercises OT Goal Formulation: With patient Time For Goal Achievement: 01/13/20 Potential to Achieve Goals: Good ADL Goals Pt Will Perform Grooming: with min assist;sitting Pt Will Perform Upper Body Bathing: with min assist;sitting Additional ADL Goal #1: pt will demonstrates eob sitting for 10 minutes supervision level  Additional ADL Goal #2: pt will complete 3 step command Additional ADL Goal #3: pt will participate in UE HEP with min (A)   Plan Discharge plan remains appropriate    Co-evaluation    PT/OT/SLP Co-Evaluation/Treatment: Yes Reason for Co-Treatment: Complexity of the patient's impairments (multi-system involvement);Necessary to address cognition/behavior during functional activity;For patient/therapist safety;To address functional/ADL transfers   OT goals addressed during session: ADL's and self-care;Strengthening/ROM      AM-PAC OT "6 Clicks" Daily Activity     Outcome Measure   Help from another person eating meals?: A Lot Help from another person taking care of personal grooming?: A Lot Help from another person toileting, which includes using toliet, bedpan, or urinal?: A Lot Help from another person bathing (including washing, rinsing, drying)?: A Lot Help from another person to put on and taking off regular upper body clothing?: A Lot Help from another person to put on and taking off regular lower body clothing?: Total 6 Click Score: 11    End of Session Equipment Utilized During Treatment: Oxygen  OT Visit Diagnosis: Unsteadiness on feet (R26.81);Muscle weakness (generalized) (M62.81)   Activity Tolerance Patient tolerated treatment well   Patient Left with call bell/phone  within reach;with nursing/sitter in room;in chair;with chair alarm set;with family/visitor present   Nurse Communication Mobility status;Precautions        Time: 1540-1610 OT Time Calculation (min): 30 min  Charges: OT General Charges $OT Visit: 1 Visit OT Treatments $Therapeutic Activity: 8-22 mins   Brynn, OTR/L  Acute Rehabilitation Services Pager: [phone] Office: [phone] .    Jeri Modena 12/30/2019, 5:15 PM

## 2019-12-31 LAB — BASIC METABOLIC PANEL
Anion gap: 11 (ref 5–15)
BUN: 35 mg/dL — ABNORMAL HIGH (ref 6–20)
CO2: 33 mmol/L — ABNORMAL HIGH (ref 22–32)
Calcium: 9.1 mg/dL (ref 8.9–10.3)
Chloride: 96 mmol/L — ABNORMAL LOW (ref 98–111)
Creatinine, Ser: 1.39 mg/dL — ABNORMAL HIGH (ref 0.61–1.24)
GFR calc Af Amer: >60 mL/min (ref >60)
GFR calc non Af Amer: >60 mL/min (ref >60)
Glucose, Bld: 134 mg/dL — ABNORMAL HIGH (ref 70–99)
Potassium: 3.8 mmol/L (ref 3.5–5.1)
Sodium: 140 mmol/L (ref 135–145)

## 2019-12-31 LAB — GLUCOSE, CAPILLARY
Glucose-Capillary: 110 mg/dL — ABNORMAL HIGH (ref 70–99)
Glucose-Capillary: 120 mg/dL — ABNORMAL HIGH (ref 70–99)
Glucose-Capillary: 128 mg/dL — ABNORMAL HIGH (ref 70–99)
Glucose-Capillary: 138 mg/dL — ABNORMAL HIGH (ref 70–99)
Glucose-Capillary: 140 mg/dL — ABNORMAL HIGH (ref 70–99)
Glucose-Capillary: 140 mg/dL — ABNORMAL HIGH (ref 70–99)

## 2019-12-31 LAB — MAGNESIUM: Magnesium: 2 mg/dL (ref 1.7–2.4)

## 2019-12-31 MED ORDER — FREE WATER
300.0000 mL | Freq: Three times a day (TID) | Status: DC
  Administered 2019-12-31 – 2020-01-08 (×24): 300 mL

## 2019-12-31 MED ORDER — LIP MEDEX EX OINT
TOPICAL_OINTMENT | CUTANEOUS | Status: DC | PRN
  Filled 2019-12-31: qty 7

## 2019-12-31 NOTE — Progress Notes (Signed)
Assisted tele visit to patient with wife.  Clifford Nichols D Lenville Hibberd, RN   

## 2019-12-31 NOTE — Progress Notes (Signed)
Inpatient Rehab Admissions:  Inpatient Rehab Consult received.  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 AC has notified TOC team to check into in-network IP Rehab programs at this time.   Please call me if questions.   Cheri Rous, OTR/L  Rehab Admissions Coordinator  [phone] 12/31/2019 2:16 PM

## 2019-12-31 NOTE — Progress Notes (Signed)
Occupational Therapy Treatment Patient Details Name: Clifford Nichols MRN: [id number] DOB: [birth year] Today's Date: 12/31/2019    History of present illness 39 year old obese man with hypertension, diabetes, admitted 12/02/19 to [facility] with acute respiratory failure, ARDS from COVID-19 pneumonia, initial positive test 5/10. Intubated 5/11, required paralytics, proning, pressors, tracheostomy 5/28; Head CT 5/12 no abnormality. Seen by opthalmology due to left eye proptosis (no intervention recommended). Tongue necrosis due to pt biting down when orally intubated   OT comments  Pt making great progress and remains appropriate for CIR at this time. Ot reaching out to [facility] coordinator regarding progress. Pt tolerated 40% FIO2 with all mobility this session with HR max 141. Pt able to cough up secretions during session with inflated cuff trach at this time. Pt total +2 min (A) for sit<>Stand with bariatric RW.   Follow Up Recommendations  CIR    Equipment Recommendations  Wheelchair (measurements OT);Hospital bed;Wheelchair cushion (measurements OT)    Recommendations for Other Services Rehab consult    Precautions / Restrictions Precautions Precautions: Fall Precaution Comments: watch RR, HR, sats       Mobility Bed Mobility Overal bed mobility: Needs Assistance Bed Mobility: Rolling Rolling: Min assist   Supine to sit: Min assist     General bed mobility comments: hob elevated with min cues able to progress to the eob  Transfers Overall transfer level: Needs assistance Equipment used: Rolling walker (2 wheeled) Transfers: Sit to/from Stand Sit to Stand: +2 physical assistance;Min assist         General transfer comment: pt requires cues for hand placement and cues for scooting to the front of chair. Pt showing ability to power up with legs    Balance   Sitting-balance support: Bilateral upper extremity supported;Feet supported Sitting balance-Leahy Scale: Fair      Standing balance support: Bilateral upper extremity supported;During functional activity Standing balance-Leahy Scale: Fair Standing balance comment: relies on bil UE on RW                           ADL either performed or assessed with clinical judgement   ADL Overall ADL's : Needs assistance/impaired Eating/Feeding: NPO   Grooming: Minimal assistance;Sitting Grooming Details (indicate cue type and reason): attempting to wipe trach and trach collar after coughing secretions             Lower Body Dressing: Maximal assistance Lower Body Dressing Details (indicate cue type and reason): pt pushing down on command to help don new tennis shoes Toilet Transfer: +2 for physical assistance;Minimal assistance;RW Toilet Transfer Details (indicate cue type and reason): simulated from recliner. power up with bil Ue          Functional mobility during ADLs: +2 for physical assistance;Maximal assistance;Rolling walker General ADL Comments: Pt taking steps this session with bil feet supinating then clearing to place foot on ground. pt requires (A) to weight shift to clear foot. pt taking ~4-6 steps then rest break. pt sustaining oxygen at 40% FIO2 and HR 141 max during session. static sitting 12o HR in chair     Vision       Perception     Praxis      Cognition Arousal/Alertness: Awake/alert Behavior During Therapy: WFL for tasks assessed/performed Overall Cognitive Status: Within Functional Limits for tasks assessed  Exercises     Shoulder Instructions       General Comments HR 141 - 120  VSS. pt required multiple rest breaks. pt pushed in chair around unit     Pertinent Vitals/ Pain       Pain Assessment: No/denies pain  Home Living                                          Prior Functioning/Environment              Frequency  Min 2X/week        Progress Toward  Goals  OT Goals(current goals can now be found in the care plan section)  Progress towards OT goals: Progressing toward goals  Acute Rehab OT Goals Patient Stated Goal: to do leg exercises OT Goal Formulation: With patient Time For Goal Achievement: 01/13/20 Potential to Achieve Goals: Good ADL Goals Pt Will Perform Grooming: with min assist;sitting Pt Will Perform Upper Body Bathing: with min assist;sitting Additional ADL Goal #1: pt will demonstrates eob sitting for 10 minutes supervision level  Additional ADL Goal #2: pt will complete 3 step command Additional ADL Goal #3: pt will participate in UE HEP with min (A)   Plan Discharge plan remains appropriate    Co-evaluation    PT/OT/SLP Co-Evaluation/Treatment: Yes Reason for Co-Treatment: Complexity of the patient's impairments (multi-system involvement);For patient/therapist safety   OT goals addressed during session: ADL's and self-care;Strengthening/ROM      AM-PAC OT "6 Clicks" Daily Activity     Outcome Measure   Help from another person eating meals?: A Little Help from another person taking care of personal grooming?: A Lot Help from another person toileting, which includes using toliet, bedpan, or urinal?: A Lot Help from another person bathing (including washing, rinsing, drying)?: A Lot Help from another person to put on and taking off regular upper body clothing?: A Lot Help from another person to put on and taking off regular lower body clothing?: A Lot 6 Click Score: 13    End of Session Equipment Utilized During Treatment: Oxygen  OT Visit Diagnosis: Unsteadiness on feet (R26.81);Muscle weakness (generalized) (M62.81)   Activity Tolerance Patient tolerated treatment well   Patient Left in chair;with call bell/phone within reach;with chair alarm set;with nursing/sitter in room   Nurse Communication Mobility status;Precautions        Time: 1445-1540 OT Time Calculation (min): 55 min  Charges: OT  General Charges $OT Visit: 1 Visit OT Treatments $Self Care/Home Management : 8-22 mins $Therapeutic Activity: 8-22 mins   Brynn, OTR/L  Acute Rehabilitation Services Pager: [phone] Office: [phone] .    Jeri Modena 12/31/2019, 4:04 PM

## 2019-12-31 NOTE — Progress Notes (Signed)
Inpatient Rehabilitation-Admissions Coordinator   Received information from [facility] team that this patient's [facility] insurance should be primary. I contacted [place] who verified that both of his BCBS and his [employer] are active but that [employer] will act as his primary. [employer] insurance now listed as primary in Epic with BCBS secondary).   CIR consult received. Met with pt bedside for rehab assessment. Upon arrival to his room, pt transferring with nursing staff back to bed after being up in the recliner for 2 hours. Pt able to answer my questions regarding PLOF and indicated an interest in CIR program after discussion. Anticipate pt will be a good candidate for CIR once he shows improvement in secretion management and FiO2 usage. Noted possible plans for downsizing, pending secretion management. Will discuss case with PM&R MD tomorrow to see if CIR is the most appropriate post acute venue for this patient.   Will follow up with pt and his family tomorrow.   Raechel Ache, OTR/L  Rehab Admissions Coordinator  [phone] 12/31/2019 5:11 PM

## 2019-12-31 NOTE — Progress Notes (Signed)
Physical Therapy Treatment Patient Details Name: Clifford Nichols MRN: [id number] DOB: 1981/04/28 Today's Date: 12/31/2019    History of Present Illness 39 year old obese man with hypertension, diabetes, admitted 12/02/19 to [facility] with acute respiratory failure, ARDS from COVID-19 pneumonia, initial positive test 5/10. Intubated 5/11, required paralytics, proning, pressors, tracheostomy 5/28; Head CT 5/12 no abnormality. Seen by opthalmology due to left eye proptosis (no intervention recommended). Tongue necrosis due to pt biting down when orally intubated    PT Comments    Emphasis on strengthening exercise for LE's, letting pt struggle to get to EOB with little assist, sit to stand from bed and chair, pre-gait at EOB progressing to short distance gait with a RW and mod +2 assist.    Follow Up Recommendations  [facility]     Equipment Recommendations  Other (comment)    Recommendations for Other Services       Precautions / Restrictions Precautions Precautions: Fall Precaution Comments: watch RR, HR, sats    Mobility  Bed Mobility Overal bed mobility: Needs Assistance Bed Mobility: Rolling Rolling: Min assist   Supine to sit: Min assist     General bed mobility comments: hob elevated with min cues able to progress to the eob  Transfers Overall transfer level: Needs assistance Equipment used: Rolling walker (2 wheeled) Transfers: Sit to/from Stand Sit to Stand: +2 physical assistance;Min assist         General transfer comment: pt requires cues for hand placement and cues for scooting to the front of chair. Pt showing ability to power up with legs  Ambulation/Gait Ambulation/Gait assistance: Mod assist;+2 physical assistance Gait Distance (Feet): 4 Feet(x2) Assistive device: Rolling walker (2 wheeled) Gait Pattern/deviations: Step-to pattern;Decreased stride length   Gait velocity interpretation: <1.31 ft/sec, indicative of household ambulator General Gait Details:  Eradic, variable steps.  Pt with weak df and supinating each foot and dragging the toes through to advance legs.  Significant w/shift assist /support needed.   Stairs             Wheelchair Mobility    Modified Rankin (Stroke Patients Only)       Balance Overall balance assessment: Needs assistance Sitting-balance support: Bilateral upper extremity supported;Feet supported;No upper extremity supported Sitting balance-Leahy Scale: Fair     Standing balance support: Bilateral upper extremity supported;During functional activity Standing balance-Leahy Scale: Poor Standing balance comment: relies on bil UE on RW                            Cognition Arousal/Alertness: Awake/alert Behavior During Therapy: WFL for tasks assessed/performed Overall Cognitive Status: Within Functional Limits for tasks assessed                                        Exercises General Exercises - Lower Extremity Heel Slides: AROM;Strengthening;Both;5 reps(strong resistance in gross extension)    General Comments General comments (skin integrity, edema, etc.): HR as high as mid 130's during activity.  sats maintained in the mid 90's on 40% FiO2, but dropped at 30% FiO2      Pertinent Vitals/Pain Pain Assessment: No/denies pain    Home Living                      Prior Function            PT Goals (current goals can  now be found in the care plan section) Acute Rehab PT Goals Patient Stated Goal: to do leg exercises PT Goal Formulation: With patient Time For Goal Achievement: 01/06/20 Potential to Achieve Goals: Good Progress towards PT goals: Progressing toward goals    Frequency    Min 4X/week      PT Plan Discharge plan needs to be updated    Co-evaluation PT/OT/SLP Co-Evaluation/Treatment: Yes Reason for Co-Treatment: Complexity of the patient's impairments (multi-system involvement) PT goals addressed during session: Mobility/safety  with mobility OT goals addressed during session: ADL's and self-care      AM-PAC PT "6 Clicks" Mobility   Outcome Measure  Help needed turning from your back to your side while in a flat bed without using bedrails?: A Little Help needed moving from lying on your back to sitting on the side of a flat bed without using bedrails?: A Little Help needed moving to and from a bed to a chair (including a wheelchair)?: A Lot Help needed standing up from a chair using your arms (e.g., wheelchair or bedside chair)?: A Lot Help needed to walk in hospital room?: A Lot Help needed climbing 3-5 steps with a railing? : Total 6 Click Score: 13    End of Session Equipment Utilized During Treatment: Oxygen Activity Tolerance: Patient tolerated treatment well Patient left: in chair;with call bell/phone within reach;with family/visitor present Nurse Communication: Mobility status;Need for lift equipment PT Visit Diagnosis: Muscle weakness (generalized) (M62.81);Other symptoms and signs involving the nervous system (R29.898);Difficulty in walking, not elsewhere classified (R26.2)     Time: 1445-1540 PT Time Calculation (min) (ACUTE ONLY): 55 min  Charges:  $Therapeutic Activity: 8-22 mins $Neuromuscular Re-education: 8-22 mins                     12/31/2019  Jacinto Halim., PT Acute Rehabilitation Services [phone]  (pager) [phone]  (office)   Eliseo Gum Anaria Kroner 12/31/2019, 6:33 PM

## 2019-12-31 NOTE — Progress Notes (Signed)
NAME:  Clifford Nichols, MRN:  [id number], DOB:  30-Aug-1980, LOS: 24 [facility] ADMISSION DATE:  12/04/2019, [facility] Admission DATE:  12/04/19 REFERRING MD:  Dr Mortimer Fries, CCM, CHIEF COMPLAINT:  Acute respiratory failure   Brief History   39 year old obese man with hypertension, diabetes admitted with acute respiratory failure, ARDS from COVID-19 pneumonia, initial positive test 5/10  Past Medical History    has a past medical history of Abscess of upper back excluding scapular region, Diabetes mellitus without complication ([place]), GERD (gastroesophageal reflux disease), Hypertension, and Sebaceous cyst.  Significant Hospital Events   5/10 admitted [facility]  5/11 failed HHFNC, BiPAP.  Required intubation 5/12 transferred to [facility], evaluated by opthal for proptosis- no intervention required 5/17 Started paralytics, ECMO team consulted Not a candidate 5/19 Started proning 5/23 Off pressors 5/26 Proning stopped, continue paralytics 5/27 New tongue wound 2/2 bite block in place, weaned off ketamine. Daily fevers 5/28 Trach, stop paralytics 6/1 Out of airborne isolation, family visiting, on PSV weans 6/6 Tolerated ATC yesterday for 12 hrs 6/7 remained on ATC  Consults:  Ophthalmology for proptosis 5/12  Procedures:  ETT 5/11 >> 5/28 L femoral CVC 5/11 >> 5/17 R radial art line 5/17 >> 5/28 L IJ CVC 5/17 >> 6/1  Trach 5/28  Significant Diagnostic Tests:  Head CT 5/11 >> no significant intracranial abnormality.  Scattered opacification and air-fluid levels in the frontal, ethmoid, sphenoid and maxillary sinuses of unclear significance.  Head CT 5/12 >> no significant intracranial abnormality  Lower extremity Doppler 5/14 >> negative for DVT  Micro Data:  SARS CoV2 5/10 >> positive MRSA screen 5/12 >> negative Respiratory 5/12 >> negative Blood 5/17 >> Staph epi and Staph hominis Respiratory 5/17 >> Staph epi 5/24 resp cx bal: staph epi 5/24 BAL acid fast: smear negative cx pending 5/24 BAL  fungal:pending 5/26 blood: No growth BAL 5/28-Acinetobacter, Proteus, Pseudomonas, Enterococcus Trach asp 6/1- Pseudomonas  Antimicrobials:  Remdesivir 5/11 >> 5/15 Dexamethasone 5/11 >> 5/20 Tocilizumab 5/12 Cefepime 5/17 >> 5/21 Linezolid 5/17 >> 5/23 Zosyn 5/21 >>  5/29 Vanco 5/28 >> 6/1  Interim history/subjective:  tmax 99.1 Remained on ATC since 6/7 am Patient denies any SOB Complains of some leg pain  Objective   Blood pressure 117/62, pulse (!) 102, temperature 98 F (36.7 C), temperature source Oral, resp. rate 20, height _0  (1.702 m), weight (!) 165.8 kg, SpO2 98 %.    FiO2 (%):  [40 %] 40 %   Intake/Output Summary (Last 24 hours) at 12/31/2019 0956 Last data filed at 12/31/2019 0847 Gross per 24 hour  Intake 3623.17 ml  Output 4125 ml  Net -501.83 ml   Filed Weights   12/29/19 0453 12/30/19 0416 12/31/19 0421  Weight: (!) 171.9 kg (!) 167.8 kg (!) 165.8 kg    Examination: General:  Morbidly obese male sitting upright in bed in NAD HEENT: MM pink/moist, midline 8 shiley XLT prox, cortrak left nare Neuro: Awake, mouths words, f/c, MAE CV: rr PULM:  Non labored, scattered rhonchi, diminished in bases, strong cough, ongoing mild to moderate thick secretions- yellow for me GI: obese, soft, NT  Extremities: warm/dry, no LE edema  Skin: no rashes   Labs reviewed, improving renal function, stable H/H  Resolved Hospital Problem list    Left proptosis, noted with coughing early 5/12, resolved.  Apparently per wife he has episodic intermittent proptosis at home as well. Head CT 5/12 reassuring Ophthalmology did evaluate, recommended that he will need outpatient evaluation but no intervention at  this time beyond keeping the eyes moist.  Septic shock, present on admission. Tongue necrosis secondary to biting down Hypertriglyceridemia Acinetobacter HAP Acute toxic metabolic encephalopathy  Assessment & Plan:   Severe ARDS due to COVID-19 pneumonia  S/p  trach  Morbid obesity, possible OSA - continue ATC trials (last on MV overnight 6/6) - hold off down sizing trach for now given ongoing secretions - SLP following  - routine trach care - prn BDs - ongoing aggressive pulmonary hygiene/ mobilize   Remainder per primary.  Awaiting LTAC placement.  PCCM will continue to follow.   Best practice:  Diet: TF Pain/Anxiety/Delirium protocol (if indicated): Clonazepam, oxycodone, Seroquel VAP protocol (if indicated): per protocol DVT prophylaxis: enoxaparin GI prophylaxis: pepcid Glucose control: SSI protocol. Levemir Mobility: PT  Code Status: Full Family Communication: per primary  Disposition: ICU until liberated from MV     Kennieth Rad, MSN, AGACNP-BC [facility] 12/31/2019, 9:56 AM  See Shea Evans for personal pager PCCM on call pager [phone]

## 2019-12-31 NOTE — Progress Notes (Addendum)
2:30pm: CSW spoke with Tresa Endo of [facility] who states this patient's insurance is not in network with [facility].  CSW spoke with patient's wife and mother who report the patient's [employer] plan is no longer active. Both report his active insurance is through [facility]. 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 Patient's wife will bring his insurance card when she comes to visit tomorrow.  CSW informed Tresa Endo of [facility] of information.  11:55am: CSW spoke with patient's mother and wife at bedside to discuss the change in the discharge plan for [facility]. Both wife and mother report they do not want the patient going to [facility]. Both wife and mother state they want the patient to go to [facility] if he is eligible. Both wife and mother state they would prefer to have the patient come home with [facility] services rather than discharge to [facility].  CSW spoke with Dr. Katrinka Blazing to have [facility] consult placed to have a formal evaluation completed by [facility] staff to determine eligibility.  CSW spoke with Dr. Renford Dills regarding the change in plans.   Edwin Dada, MSW, LCSW-A Transitions of Care  Clinical Social Worker  [facility]  Medical ICU [phone]

## 2019-12-31 NOTE — Progress Notes (Signed)
Assisted tele visit to patient with family member.  Audiel Scheiber P, RN  

## 2019-12-31 NOTE — Progress Notes (Signed)
PROGRESS NOTE    Clifford Nichols  GGY:[id number] DOB: 1980/09/05 DOA: 12/04/2019 PCP: [facility] [place]   Brief Narrative: Patient is a 39 year old with history of hypertension, diabetes type 2, morbid obesity who was admitted with acute respiratory failure from Covid pneumonia on 5/10 at [facility].  He failed high flow nasal cannula, BiPAP.  He developed ARDS and was intubated.  He was transferred to [facility] for evaluation by ophthalmology for proptosis, no intervention required.Hospital course was prolonged due to persistent respiratory failure, status post trach.  He has been hemodynamically stable on vent and now on Trach collar.   PCCM following.  Patient transferred to [facility] service on 12/28/2019.  Current plan is to move to LTAC.  Placement issue is ongoing.  Assessment & Plan:   Active Problems:   HTN (hypertension)   Acute respiratory failure due to COVID-19 ([place])   Hyperglycemia   Diabetes ([place])   COVID-19   ARDS (adult respiratory distress syndrome) (HCC)   Acute respiratory failure (HCC)   Acute on chronic respiratory failure with hypoxia and hypercapnia (HCC)   Pressure injury of skin   Severe ARDS secondary to Covid pneumonia: Status post trach. He was on vent ,now on trach collar.  Continue PSV trials.  Follow intermittent chest x-ray.  He is also on IV Lasix daily for volume overload, we can send the Lasix to oral on discharge. He is on tube feeding, speech therapy is following.  Fever: Resolved.  Found to have multiple bacteria on BAL.  Case was discussed with ID, recommended to observe off antibiotics.  Of antibiotics  Hypernatremia: Much improved.  Normocytic anemia: Hemoglobin in the range of 7.  Anemia is most likely associated with critical illness.  Continue to monitor.  AKI: Currently improving and stable  Morbid obesity: BMI of 59.1.  He might have possible underlying OSA.  Diabetes type 2: On sliding scale insulin and Levemir.   Last HbA1c of 6.6 .Monitor CBGs  Disposition: Prolonged hospitalization.Initially recommended CIR by PT but current plan is to move him to LTAC.  Social worker closely following.  He was accepted by [facility] but family not agreeable and want CIR or [facility]    Pressure Injury 12/19/19 Other (Comment) Left Deep Tissue Pressure Injury - Purple or maroon localized area of discolored intact skin or blood-filled blister due to damage of underlying soft tissue from pressure and/or shear. DTI tongue (Active)  12/19/19 0315  Location: Other (Comment) (toungue)  Location Orientation: Left  Staging: Deep Tissue Pressure Injury - Purple or maroon localized area of discolored intact skin or blood-filled blister due to damage of underlying soft tissue from pressure and/or shear.  Wound Description (Comments): DTI tongue  Present on Admission: No     Pressure Injury 12/26/19 Sacrum Mid Stage 3 -  Full thickness tissue loss. Subcutaneous fat may be visible but bone, tendon or muscle are NOT exposed. (Active)  12/26/19 1300  Location: Sacrum  Location Orientation: Mid  Staging: Stage 3 -  Full thickness tissue loss. Subcutaneous fat may be visible but bone, tendon or muscle are NOT exposed.  Wound Description (Comments):   Present on Admission: No     Pressure Injury 12/26/19 Ear Right Stage 1 -  Intact skin with non-blanchable redness of a localized area usually over a bony prominence. (Active)  12/26/19 2000  Location: Ear  Location Orientation: Right  Staging: Stage 1 -  Intact skin with non-blanchable redness of a localized area usually over a  bony prominence.  Wound Description (Comments):   Present on Admission: No        Nutrition Problem: Increased nutrient needs Etiology: acute illness(COVID-19 PNA)      DVT prophylaxis:Lovenox Code Status: Full Family Communication: None present at bedside Status is: Inpatient  Remains inpatient appropriate because:Unsafe d/c plan   Dispo: The  patient is from: Home              Anticipated d/c is to: LTAC              Anticipated d/c date is: Unknown              Patient currently is stable for discharge to LTAC.    Consultants: PCCM  Events: 5/10 admitted [facility]  5/11 failed HHFNC, BiPAP.  Required intubation 5/12 transferred to [facility], evaluated by opthal for proptosis- no intervention required 5/17 Started paralytics, ECMO team consulted Not a candidate 5/19 Started proning 5/23 Off pressors 5/26 Proning stopped, continue paralytics 5/27 New tongue wound 2/2 bite block in place, weaned off ketamine. Daily fevers 5/28 Trach, stop paralytics 6/1 Out of airborne isolation, family visiting, on PSV weans  Procedures: ETT 5/11 >> 5/28 L femoral CVC 5/11 >> 5/17 R radial art line 5/17 >> 5/28 L IJ CVC 5/17 >> 6/1 Trach 5/28   Antimicrobials:  Anti-infectives (From admission, onward)   Start     Dose/Rate Route Frequency Ordered Stop   12/21/19 0900  vancomycin (VANCOREADY) IVPB 2000 mg/400 mL     2,000 mg 200 mL/hr over 120 Minutes Intravenous Every 24 hours 12/20/19 0845 12/24/19 1156   12/20/19 0900  vancomycin (VANCOCIN) 2,500 mg in sodium chloride 0.9 % 500 mL IVPB     2,500 mg 250 mL/hr over 120 Minutes Intravenous  Once 12/20/19 0845 12/20/19 1237   12/14/19 0000  piperacillin-tazobactam (ZOSYN) IVPB 3.375 g     3.375 g 12.5 mL/hr over 240 Minutes Intravenous Every 8 hours 12/13/19 1652 12/19/19 1953   12/13/19 1700  piperacillin-tazobactam (ZOSYN) IVPB 3.375 g     3.375 g 100 mL/hr over 30 Minutes Intravenous  Once 12/13/19 1651 12/13/19 1741   12/10/19 0100  ceFEPIme (MAXIPIME) 2 g in sodium chloride 0.9 % 100 mL IVPB  Status:  Discontinued     2 g 200 mL/hr over 30 Minutes Intravenous Every 8 hours 12/09/19 2044 12/12/19 0942   12/09/19 2200  ceftaroline (TEFLARO) 600 mg in sodium chloride 0.9 % 250 mL IVPB  Status:  Discontinued     600 mg 250 mL/hr over 60 Minutes Intravenous Every 12 hours 12/09/19 2023  12/09/19 2024   12/09/19 2200  ceftaroline (TEFLARO) 600 mg in sodium chloride 0.9 % 250 mL IVPB  Status:  Discontinued    Note to Pharmacy: Pharmacy may modify dose for renal failure.   600 mg 250 mL/hr over 60 Minutes Intravenous Every 12 hours 12/09/19 2024 12/09/19 2044   12/09/19 2130  linezolid (ZYVOX) IVPB 600 mg     600 mg 300 mL/hr over 60 Minutes Intravenous Every 12 hours 12/09/19 2044 12/15/19 2349   12/09/19 0900  ceFEPIme (MAXIPIME) 2 g in sodium chloride 0.9 % 100 mL IVPB  Status:  Discontinued     2 g 200 mL/hr over 30 Minutes Intravenous Every 8 hours 12/09/19 0836 12/09/19 2023   12/05/19 1000  remdesivir 100 mg in sodium chloride 0.9 % 100 mL IVPB     100 mg 200 mL/hr over 30 Minutes Intravenous Daily 12/04/19 1417 12/06/19  1003      Subjective:  Patient seen and examined the bedside this morning.  Hemodynamically stable.  Comfortable.  Today he is on trach collar at 5 L/min.  Not in any kind of respiratory distress.  No new complaints.   Objective: Vitals:   12/31/19 0421 12/31/19 0500 12/31/19 0600 12/31/19 0748  BP:  119/69 113/69 134/76  Pulse:  (!) 105 (!) 104 (!) 103  Resp:  (!) 28 (!) 23 20  Temp:    98 F (36.7 C)  TempSrc:    Oral  SpO2:  95% 95% 100%  Weight: (!) 165.8 kg     Height:        Intake/Output Summary (Last 24 hours) at 12/31/2019 0803 Last data filed at 12/31/2019 0600 Gross per 24 hour  Intake 3513.17 ml  Output 3650 ml  Net -136.83 ml   Filed Weights   12/29/19 0453 12/30/19 0416 12/31/19 0421  Weight: (!) 171.9 kg (!) 167.8 kg (!) 165.8 kg    Examination:    General exam: Comfortable, morbidly obese HEENT: Trach, feeding tube Respiratory system: Bilateral equal air entry, normal vesicular breath sounds, no wheezes or crackles  Cardiovascular system: S1 & S2 heard, RRR. No JVD, murmurs, rubs, gallops or clicks. Gastrointestinal system: Abdomen is nondistended, soft and nontender. No organomegaly or masses felt. Normal  bowel sounds heard. Central nervous system: Alert and oriented. No focal neurological deficits. Extremities: Trace lower extremity edema, no clubbing ,no cyanosis Skin: Pressure ulcer on the sacrum.    Data Reviewed: I have personally reviewed following labs and imaging studies  CBC: Recent Labs  Lab 12/26/19 0241 12/27/19 0526 12/28/19 0456 12/29/19 0302 12/30/19 0009  WBC 7.8 7.3 7.2 7.9 9.5  NEUTROABS  --   --   --  5.0 6.8  HGB 7.8* 8.4* 7.7* 8.1* 8.4*  HCT 27.7* 30.6* 27.6* 28.3* 29.0*  MCV 107.8* 108.1* 106.6* 104.4* 103.6*  PLT 156 167 155 151 540*   Basic Metabolic Panel: Recent Labs  Lab 12/25/19 0345 12/25/19 0345 12/26/19 0241 12/27/19 0526 12/28/19 0456 12/29/19 0302 12/31/19 0242  NA 149*   < > 148* 149* 145 145 140  K 3.4*   < > 3.5 3.7 3.4* 3.6 3.8  CL 108   < > 108 105 104 101 96*  CO2 30   < > 29 32 30 31 33*  GLUCOSE 178*   < > 144* 157* 149* 121* 134*  BUN 50*   < > 45* 35* 37* 34* 35*  CREATININE 1.65*   < > 1.46* 1.40* 1.50* 1.47* 1.39*  CALCIUM 9.1   < > 9.1 9.3 8.9 9.0 9.1  MG 2.1  --  2.0 2.0 1.9  --  2.0  PHOS 4.4  --  4.3 4.4 5.0* 5.2*  --    < > = values in this interval not displayed.   GFR: Estimated Creatinine Clearance: 110.2 mL/min (A) (by C-G formula based on SCr of 1.39 mg/dL (H)). Liver Function Tests: No results for input(s): AST, ALT, ALKPHOS, BILITOT, PROT, ALBUMIN in the last 168 hours. No results for input(s): LIPASE, AMYLASE in the last 168 hours. No results for input(s): AMMONIA in the last 168 hours. Coagulation Profile: No results for input(s): INR, PROTIME in the last 168 hours. Cardiac Enzymes: No results for input(s): CKTOTAL, CKMB, CKMBINDEX, TROPONINI in the last 168 hours. BNP (last 3 results) No results for input(s): PROBNP in the last 8760 hours. HbA1C: No results for input(s): HGBA1C in  the last 72 hours. CBG: Recent Labs  Lab 12/30/19 1153 12/30/19 1523 12/30/19 1954 12/31/19 0009 12/31/19 0355    GLUCAP 159* 117* 124* 120* 140*   Lipid Profile: No results for input(s): CHOL, HDL, LDLCALC, TRIG, CHOLHDL, LDLDIRECT in the last 72 hours. Thyroid Function Tests: No results for input(s): TSH, T4TOTAL, FREET4, T3FREE, THYROIDAB in the last 72 hours. Anemia Panel: No results for input(s): VITAMINB12, FOLATE, FERRITIN, TIBC, IRON, RETICCTPCT in the last 72 hours. Sepsis Labs: Recent Labs  Lab 12/24/19 0852 12/25/19 0345 12/26/19 0241  PROCALCITON 1.89 1.05 0.70    Recent Results (from the past 240 hour(s))  Culture, respiratory (non-expectorated)     Status: None   Collection Time: 12/24/19  9:23 AM   Specimen: Tracheal Aspirate; Respiratory  Result Value Ref Range Status   Specimen Description TRACHEAL ASPIRATE  Final   Special Requests Normal  Final   Gram Stain   Final    ABUNDANT WBC PRESENT,BOTH PMN AND MONONUCLEAR ABUNDANT GRAM POSITIVE RODS Performed at [facility], [place] [address], [place], [place] [address]    Culture   Final    FEW PSEUDOMONAS AERUGINOSA FEW PROTEUS MIRABILIS    Report Status 12/26/2019 FINAL  Final   Organism ID, Bacteria PSEUDOMONAS AERUGINOSA  Final   Organism ID, Bacteria PROTEUS MIRABILIS  Final      Susceptibility   Pseudomonas aeruginosa - MIC*    CEFTAZIDIME 2 SENSITIVE Sensitive     CIPROFLOXACIN 2 INTERMEDIATE Intermediate     GENTAMICIN >=16 RESISTANT Resistant     IMIPENEM >=16 RESISTANT Resistant     PIP/TAZO <=4 SENSITIVE Sensitive     CEFEPIME <=1 SENSITIVE Sensitive     * FEW PSEUDOMONAS AERUGINOSA   Proteus mirabilis - MIC*    AMPICILLIN <=2 SENSITIVE Sensitive     CEFAZOLIN <=4 SENSITIVE Sensitive     CEFEPIME <=1 SENSITIVE Sensitive     CEFTAZIDIME <=1 SENSITIVE Sensitive     CEFTRIAXONE <=1 SENSITIVE Sensitive     CIPROFLOXACIN <=0.25 SENSITIVE Sensitive     GENTAMICIN <=1 SENSITIVE Sensitive     IMIPENEM 2 SENSITIVE Sensitive     TRIMETH/SULFA <=20 SENSITIVE Sensitive     AMPICILLIN/SULBACTAM <=2 SENSITIVE  Sensitive     PIP/TAZO <=4 SENSITIVE Sensitive     * FEW PROTEUS MIRABILIS         Radiology Studies: No results found.      Scheduled Meds: . vitamin C  500 mg Per Tube Daily  . chlorhexidine gluconate (MEDLINE KIT)  15 mL Mouth Rinse BID  . Chlorhexidine Gluconate Cloth  6 each Topical Daily  . clonazePAM  2 mg Per Tube BID  . collagenase   Topical Daily  . enoxaparin (LOVENOX) injection  0.5 mg/kg Subcutaneous Q24H  . famotidine  20 mg Per Tube BID  . feeding supplement (PRO-STAT SUGAR FREE 64)  60 mL Per Tube TID  . fentaNYL (SUBLIMAZE) injection  50 mcg Intravenous Once  . free water  400 mL Per Tube Q4H  . furosemide  40 mg Intravenous Daily  . insulin aspart  0-20 Units Subcutaneous Q4H  . insulin detemir  5 Units Subcutaneous BID  . mouth rinse  15 mL Mouth Rinse 10 times per day  . nutrition supplement (JUVEN)  1 packet Per Tube BID BM  . oxyCODONE  10 mg Per Tube Q4H  . QUEtiapine  50 mg Per Tube BID  . sodium chloride flush  10-40 mL Intracatheter Q12H  . sodium  chloride HYPERTONIC  4 mL Nebulization BID  . zinc sulfate  220 mg Per Tube Daily   Continuous Infusions: . sodium chloride 11 mL/hr at 12/25/19 0341  . sodium chloride 10 mL/hr at 12/28/19 0600  . feeding supplement (VITAL 1.5 CAL) 1,000 mL (12/30/19 1732)     LOS: 27 days    Time spent:35 mins, More than 50% of that time was spent in counseling and/or coordination of care.      Shelly Coss, MD [facility] P6/02/2020, 8:03 AM

## 2020-01-01 DIAGNOSIS — E0810 Diabetes mellitus due to underlying condition with ketoacidosis without coma: Secondary | ICD-10-CM

## 2020-01-01 LAB — GLUCOSE, CAPILLARY
Glucose-Capillary: 127 mg/dL — ABNORMAL HIGH (ref 70–99)
Glucose-Capillary: 128 mg/dL — ABNORMAL HIGH (ref 70–99)
Glucose-Capillary: 134 mg/dL — ABNORMAL HIGH (ref 70–99)
Glucose-Capillary: 137 mg/dL — ABNORMAL HIGH (ref 70–99)
Glucose-Capillary: 140 mg/dL — ABNORMAL HIGH (ref 70–99)
Glucose-Capillary: 141 mg/dL — ABNORMAL HIGH (ref 70–99)
Glucose-Capillary: 141 mg/dL — ABNORMAL HIGH (ref 70–99)

## 2020-01-01 LAB — CBC WITH DIFFERENTIAL/PLATELET
Abs Immature Granulocytes: 0.08 10*3/uL — ABNORMAL HIGH (ref 0.00–0.07)
Basophils Absolute: 0 10*3/uL (ref 0.0–0.1)
Basophils Relative: 0 %
Eosinophils Absolute: 0.5 10*3/uL (ref 0.0–0.5)
Eosinophils Relative: 5 %
HCT: 30.7 % — ABNORMAL LOW (ref 39.0–52.0)
Hemoglobin: 8.9 g/dL — ABNORMAL LOW (ref 13.0–17.0)
Immature Granulocytes: 1 %
Lymphocytes Relative: 18 %
Lymphs Abs: 2 10*3/uL (ref 0.7–4.0)
MCH: 29.7 pg (ref 26.0–34.0)
MCHC: 29 g/dL — ABNORMAL LOW (ref 30.0–36.0)
MCV: 102.3 fL — ABNORMAL HIGH (ref 80.0–100.0)
Monocytes Absolute: 0.6 10*3/uL (ref 0.1–1.0)
Monocytes Relative: 5 %
Neutro Abs: 7.8 10*3/uL — ABNORMAL HIGH (ref 1.7–7.7)
Neutrophils Relative %: 71 %
Platelets: 146 10*3/uL — ABNORMAL LOW (ref 150–400)
RBC: 3 MIL/uL — ABNORMAL LOW (ref 4.22–5.81)
RDW: 15.8 % — ABNORMAL HIGH (ref 11.5–15.5)
WBC: 11 10*3/uL — ABNORMAL HIGH (ref 4.0–10.5)
nRBC: 0 % (ref 0.0–0.2)

## 2020-01-01 LAB — BASIC METABOLIC PANEL
Anion gap: 13 (ref 5–15)
BUN: 36 mg/dL — ABNORMAL HIGH (ref 6–20)
CO2: 34 mmol/L — ABNORMAL HIGH (ref 22–32)
Calcium: 9.4 mg/dL (ref 8.9–10.3)
Chloride: 91 mmol/L — ABNORMAL LOW (ref 98–111)
Creatinine, Ser: 1.37 mg/dL — ABNORMAL HIGH (ref 0.61–1.24)
GFR calc Af Amer: >60 mL/min (ref >60)
GFR calc non Af Amer: >60 mL/min (ref >60)
Glucose, Bld: 132 mg/dL — ABNORMAL HIGH (ref 70–99)
Potassium: 3.6 mmol/L (ref 3.5–5.1)
Sodium: 138 mmol/L (ref 135–145)

## 2020-01-01 MED ORDER — ORAL CARE MOUTH RINSE
15.0000 mL | Freq: Two times a day (BID) | OROMUCOSAL | Status: DC
  Administered 2020-01-01 – 2020-01-10 (×18): 15 mL via OROMUCOSAL

## 2020-01-01 MED ORDER — OXYCODONE HCL 5 MG PO TABS
10.0000 mg | ORAL_TABLET | Freq: Four times a day (QID) | ORAL | Status: DC
  Administered 2020-01-01 – 2020-01-03 (×8): 10 mg
  Filled 2020-01-01 (×8): qty 2

## 2020-01-01 MED ORDER — CHLORHEXIDINE GLUCONATE 0.12 % MT SOLN
15.0000 mL | Freq: Two times a day (BID) | OROMUCOSAL | Status: DC
  Administered 2020-01-01 – 2020-01-10 (×16): 15 mL via OROMUCOSAL
  Filled 2020-01-01 (×14): qty 15

## 2020-01-01 MED ORDER — CLONAZEPAM 1 MG PO TABS
1.0000 mg | ORAL_TABLET | Freq: Two times a day (BID) | ORAL | Status: DC
  Administered 2020-01-01 – 2020-01-07 (×14): 1 mg
  Filled 2020-01-01 (×14): qty 1

## 2020-01-01 NOTE — Procedures (Signed)
Tracheostomy Change Note  Patient Details:   Name: Clifford Nichols DOB: 1981/02/21 MRN: [id number]    Airway Documentation:     Evaluation  O2 sats: stable throughout Complications: No apparent complications Patient did tolerate procedure well. Bilateral Breath Sounds: Rhonchi, Diminished    Patients trach changed from #8 XLT to #6 shiley cuffless per MD order. Good color change on CO2. Vitals stable throughout procedure. New trach ties secured.   Harmon Dun Sarahelizabeth Conway 01/01/2020, 2:12 PM

## 2020-01-01 NOTE — Progress Notes (Signed)
  Speech Language Pathology Treatment: Clifford Nichols Speaking valve  Patient Details Name: Clifford Nichols MRN: [id number] DOB: 10-Aug-1980 Today's Date: 01/01/2020 Time: 5465-0354 SLP Time Calculation (min) (ACUTE ONLY): 20 min  Assessment / Plan / Recommendation Clinical Impression  Skilled ST intervention using PMV on trach collar respiratory support. Plans to change trach to smaller, cuffless later today. Drowsy, participatory today; receives daily Seroquel and Klonopin. Cuff deflation was unremarkable with less auditory secretions overall. Periods of effective throat clearing resulting in clearer phonation. Secretions would return preventing audible phonation in combination with reduced diaphragmatic support. All vitals stable during session. Pt's cognition is continuing to improve from last week. He is significantly deconditioned and weak affecting movement and all aspects of phonatory tract. He is close to initiating swallow assessment but would not recommend today given sleepiness. Downsizing of trach and cuffless this afternoon will help allow greater airflow for phonation.      HPI HPI: 39 year old obese man with hypertension, diabetes, admitted 12/02/19 to [facility] with acute respiratory failure, ARDS from COVID-19 pneumonia, initial positive test 5/10. Intubated 5/11, required paralytics, proning, pressors, tracheostomy 5/28; Head CT 5/12 no abnormality. 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

## 2020-01-01 NOTE — PMR Pre-admission (Signed)
PMR Admission Coordinator Pre-Admission Assessment  Patient: Clifford Nichols is an 39 y.o., male MRN: [id number] DOB: 1980/12/19 Height: 5' 7" (170.2 cm) Weight: (!) 157.8 kg  Insurance Information HMO:     PPO: yes     PCP:      IPA:      80/20:      OTHER:  PRIMARY: [employer]      Policy#: [id number]      Subscriber: patient CM Name: Heywood Iles via automated fax      Phone#: [phone]     Fax#: [phone] Pre-Cert#: [id number]      Employer:  [employer]aufmann provided by Heywood Iles via fax for admit to CIR on 6/21. Pt is approved for 7 days with service from 6/21-6/28. Clinical updates are due to Heywood Iles on 6/28 at (f): [phone] with "attention to auth # [id number]"  (p): [phone] Benefits:  Phone #: [phone]     Name: Jenny Reichmann ref #[id number] Eff. Date: 07/26/19-still active      Deduct: $0 (doesn't have deductible)      Out of Pocket Max: $2,500 ($0 met)      Life Max: NA CIR: $750 co-pay/admission      SNF: 80% coverage, 20% co-insurance; with a limit of 120 days/cal yr Outpatient: $40 co-pay/visit      Home Health: 80% coverage, 20% co-insurance      DME: 80% coverage, 20% co-insurance     Providers:  SECONDARY: BCBS non-partiticapting       Policy#: [id number]  [id number]   Phone#: [phone]  Financial Counselor:       Phone#:   The "Data Collection Information Summary" for patients in Inpatient Rehabilitation Facilities with attached "Privacy Act [place] Records" was provided and verbally reviewed with: N/A  Emergency Contact Information Contact Information    Name Relation Home Work [place], [place] Spouse [phone]  [phone]   Corlis Leak Father [phone]     Emi Holes Mother [phone]  [phone]      Current Medical History  Patient Admitting Diagnosis: 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 Pt was transferred to [facility] on 12/04/19 for a diagnosis of severe acute hypoxic respiratory failure due to COVID-19 PNA and ARDS. Pt required paralytics and proning. Pt developed new tongue wound due to bite block which led to necrosis; this issue has since resolved after supportive care. Pt continued with daily fevers. His septic shock with bacterial infection has been resolved and pt currently not on antibiotics. Pt required tracheostomy on 5/28 and was eventually extubated on 12/30/19. Pt came off of airborne isolation for COVID-19 on 6/1. Pt intermittantly required assist from CPAP at nighttime due to desaturations but has remained off since 6/18. Recommendations remain for sleep study due to severe OSA. Pt continues to have overall debility from hospital stay and CIR has been recommended. The patient is to admit to CIR on 01/13/20.    Patient's medical record from [facility] has been reviewed by the rehabilitation admission coordinator and physician.  Past Medical History  Past Medical History:  Diagnosis Date  . Abscess of upper back excluding scapular region   . Diabetes mellitus without complication ([place])   . GERD (gastroesophageal reflux disease)    OCC-NO MEDS  . Hypertension   . Sebaceous cyst  Family History   family history includes Hypertension in his father.  Prior Rehab/Hospitalizations Has the patient had prior rehab or hospitalizations prior to admission? No  Has the patient had major surgery during 100 days prior to admission? No   Current Medications  Current Facility-Administered Medications:  .  0.9 % NaCl with KCl 40 mEq / L  infusion, , Intravenous, Continuous, Thurnell Lose, MD, Last Rate: 100 mL/hr at 01/13/20 0555, New Bag at 01/13/20 0555 .  acetaminophen (TYLENOL) 160 MG/5ML solution 650 mg, 650 mg, Per Tube, Q6H PRN, Agarwala, Ravi, MD, 650 mg at 01/01/20 0010 .  ascorbic acid (VITAMIN C)  tablet 500 mg, 500 mg, Oral, Daily, Steenwyk, Yujing Z, RPH, 500 mg at 01/13/20 0806 .  Chlorhexidine Gluconate Cloth 2 % PADS 6 each, 6 each, Topical, Daily, Kipp Brood, MD, 6 each at 01/12/20 0925 .  collagenase (SANTYL) ointment, , Topical, Daily, Kipp Brood, MD, Given at 01/13/20 [phone] .  enoxaparin (LOVENOX) injection 80 mg, 80 mg, Subcutaneous, Q12H, Thurnell Lose, MD, 80 mg at 01/13/20 0806 .  gabapentin (NEURONTIN) capsule 300 mg, 300 mg, Oral, BID, Merlene Laughter F, NP, 300 mg at 01/13/20 0807 .  guaiFENesin (ROBITUSSIN) 100 MG/5ML solution 100 mg, 5 mL, Oral, Q4H PRN, Agarwala, Ravi, MD, 100 mg at 01/03/20 1956 .  insulin aspart (novoLOG) injection 0-15 Units, 0-15 Units, Subcutaneous, TID WC, Thurnell Lose, MD, 2 Units at 01/13/20 0805 .  insulin detemir (LEVEMIR) injection 5 Units, 5 Units, Subcutaneous, Daily, Thurnell Lose, MD, 5 Units at 01/13/20 0805 .  lip balm (CARMEX) ointment, , Topical, PRN, Agarwala, Ravi, MD .  melatonin tablet 3 mg, 3 mg, Oral, QHS, Agarwala, Ravi, MD, 3 mg at 01/12/20 2100 .  oxyCODONE (Oxy IR/ROXICODONE) immediate release tablet 10 mg, 10 mg, Oral, Q4H, Steenwyk, Yujing Z, RPH, 10 mg at 01/13/20 1210 .  potassium chloride SA (KLOR-CON) CR tablet 20 mEq, 20 mEq, Oral, Daily, Thurnell Lose, MD, 20 mEq at 01/13/20 0806 .  zinc sulfate capsule 220 mg, 220 mg, Oral, Daily, Steenwyk, Yujing Z, RPH, 220 mg at 01/13/20 0805 .  zolpidem (AMBIEN) tablet 5 mg, 5 mg, Per Tube, QHS PRN, Kipp Brood, MD  Patients Current Diet:  Diet Order            Diet regular Room service appropriate? Yes with Assist; Fluid consistency: Thin  Diet effective now                 Precautions / Restrictions Precautions Precautions: Fall Precaution Comments: watch RR, HR, sats Restrictions Weight Bearing Restrictions: No   Has the patient had 2 or more falls or a fall with injury in the past year? No  Prior Activity Level Community (5-7x/wk): pt  was independent PTA, worked full time as Presenter, broadcasting at [employer]. drove, active  Prior Functional Level Self Care: Did the patient need help bathing, dressing, using the toilet or eating? Independent  Indoor Mobility: Did the patient need assistance with walking from room to room (with or without device)? Independent  Stairs: Did the patient need assistance with internal or external stairs (with or without device)? Independent  Functional Cognition: Did the patient need help planning regular tasks such as shopping or remembering to take medications? Independent  Home Assistive Devices / Equipment    Prior Device Use: Indicate devices/aids used by the patient prior to current illness, exacerbation or injury? None of the above  Current Functional Level Cognition  Overall Cognitive  Status: Within Functional Limits for tasks assessed Difficult to assess due to: Tracheostomy Current Attention Level: Focused, Sustained Orientation Level: Oriented X4 Following Commands: Follows one step commands with increased time General Comments: pt nodding head at times to indicate a yet. pt following 1 step comamnds    Extremity Assessment (includes Sensation/Coordination)  Upper Extremity Assessment: RUE deficits/detail, LUE deficits/detail RUE Deficits / Details: AAROM shoulder flexion, bicep activation, tricep activation, wrist activation. Pt with hook grasp with all attempts to squeeze with hands. pt able to scapula elevate with weaker depression. Pt with weak attempts to retract scapula  LUE Deficits / Details: AAROM shoulder flexion, bicep activation, tricep activation, wrist activation. Pt with hook grasp with all attempts to squeeze with hands. pt able to scapula elevate with weaker depression. Pt with weak attempts to retract scapula   Lower Extremity Assessment: Defer to PT evaluation RLE Deficits / Details: no activation or ankle or toes this session. pt does demonstrate quad and  hamstrings LLE Deficits / Details: no activation of ankle or toes but demonstrates quad and hamstrings    ADLs  Overall ADL's : Needs assistance/impaired Eating/Feeding: NPO Grooming: Wash/dry face, Brushing hair, Minimal assistance, Moderate assistance, Standing Grooming Details (indicate cue type and reason): +2 for safety with standing; min-modA for balance without UE support  Lower Body Dressing: Maximal assistance Lower Body Dressing Details (indicate cue type and reason): pt pushing down on command to help don new tennis shoes Toilet Transfer: +2 for physical assistance, Minimal assistance, RW Toilet Transfer Details (indicate cue type and reason): simulated from recliner. power up with bil Ue  Functional mobility during ADLs: Minimal assistance, Moderate assistance, +2 for safety/equipment, Rolling walker General ADL Comments: pt with steady progress towards OT goals; practicing functional tasks in standing while challenging balance - including standing grooming ADL, donning/doffing gloves, reaching and placing items on table, reaching and reading aloud card from family    Mobility  Overal bed mobility: Needs Assistance Bed Mobility: Sit to Sidelying Rolling: Min guard Sidelying to sit: Min guard Supine to sit: Min guard Sit to supine: Mod assist Sit to sidelying: Mod assist General bed mobility comments: sitting EOB on arrival    Transfers  Overall transfer level: Needs assistance Equipment used: Rolling walker (2 wheeled) Transfer via Lift Equipment: [place] Transfers: Sit to/from Stand Sit to Stand: [place]man assist General transfer comment: needs stability assist due to his limited ankle/foot function.    Ambulation / Gait / Stairs / Wheelchair Mobility  Ambulation/Gait Ambulation/Gait assistance: Min assist, Mod assist, +2 safety/equipment Gait Distance (Feet): 40 Feet (70 feet and 50 feet) Assistive device: Rolling walker (2 wheeled) Gait Pattern/deviations: Step-through  pattern, Decreased stride length, Decreased dorsiflexion - right, Decreased dorsiflexion - left General Gait Details: more stability assist needed due to pt with shoes on today making his feet heavier, use of everters to df are weak and drag with swing through and then mildly more unsteady contact. Gait velocity: slower Gait velocity interpretation: <1.8 ft/sec, indicate of risk for recurrent falls    Posture / Balance Dynamic Sitting Balance Sitting balance - Comments: worked at edge of the chair  on sitting balance Balance Overall balance assessment: Needs assistance Sitting-balance support: Bilateral upper extremity supported, No upper extremity supported, Feet supported Sitting balance-Leahy Scale: Fair Sitting balance - Comments: worked at edge of the chair  on sitting balance Standing balance support: Bilateral upper extremity supported, During functional activity Standing balance-Leahy Scale: Poor Standing balance comment: relies on bil UE on RW.  There is little to very weak ankle and foot function.    Special needs/care consideration Continuous Drip IV: 0.9% NaCl with KCl 40 mE1/L infusion  Oxygen: 5L/min (28% FiO2),   Special Bed: air mattress due to sacral wound  Trach size: 2m cuffed  Skin: abrasion to bil breasts, MASD bilateral breasts; left deep tissue pressure injury to tonge, pressure injury to mid sacrum (instageable)  Diabetic management: yes   Special service needs: hydrotherapy   and Designated visitor: TLevander Campion(wife) and SWilburn Cornelia(mother)   Previous HEnvironmental health practitioner(from acute therapy documentation) Additional Comments: no family present  Discharge Living Setting Plans for Discharge Living Setting: Other (Comment) (will go to his mom's house at [place]) Type of Home at Discharge: House Discharge Home Layout: One level Discharge Home Access: Stairs to enter Entrance Stairs-Rails: Left Entrance Stairs-Number of Steps: 3-4 Discharge Bathroom Shower/Tub: Walk-in  shower Discharge Bathroom Toilet: Standard Discharge Bathroom Accessibility: Yes How Accessible: Accessible via walker Does the patient have any problems obtaining your medications?: No  Social/Family/Support Systems Patient Roles: Spouse, Parent, Other (Comment) (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[employer]) Contact Information: wife: TLevander [phone] mother (Wilburn Cornelia: [phone] Caregiver: TLevander Campionand SWilburn Cornelia Anticipated Caregiver's Contact Information: see above Ability/Limitations of Caregiver: Min A Caregiver Availability: 24/7 Discharge Plan Discussed with Primary Caregiver: Yes (with pt and his wife TLevander Campion Is Caregiver In Agreement with Plan?: Yes Does Caregiver/Family have Issues with Lodging/Transportation while Pt is in Rehab?: No  Goals Patient/Family Goal for Rehab: PT/OT: Mod I/Supervision; SLP: Mod I/Supervision Expected length of stay: 12-16 days Cultural Considerations: NA Pt/Family Agrees to Admission and willing to participate: Yes Program Orientation Provided & Reviewed with Pt/Caregiver Including Roles  & Responsibilities: Yes(pt and his wife )  Barriers to Discharge: Home environment access/layout, TTherapist, nutritional Nutrition means, New oxygen  Barriers to Discharge Comments: steps to enter home, new to O2, new trach  Decrease burden of Care through IP rehab admission: NA  Possible need for SNF placement upon discharge: Not anticipated. Pt has great family support at [place] and has a good prognosis for further progress through CIR given his PLOF, age, current progress, and motivation.   Patient Condition: [facility] spoken with RN, and patient and spouse. I met with patient at the bedside for inpatient rehabilitation assessment.  Patient will benefit from ongoing PT, OT and SLP, can actively participate in 3 hours of therapy a day 5 days of the week, and can make measurable gains during the admission.  Patient  will also benefit from the coordinated team approach during an Inpatient Acute Rehabilitation admission.  The patient will receive intensive therapy as well as Rehabilitation physician, nursing, social worker, and care management interventions.  Due to safety, skin/wound care, disease management, medication administration, pain management and patient education the patient requires 24 hour a day rehabilitation nursing.  The patient is currently Min; Mod A +2 for ambulation and Min/Mod A for basic ADLs.  Discharge setting and therapy post discharge at home with home health is anticipated.  Patient has agreed to participate in the Acute Inpatient Rehabilitation Program and will admit 01/13/20.  Preadmission Screen Completed By:  KRaechel Ache 01/13/2020 12:22 PM ______________________________________________________________________   Discussed status with Dr. KLetta Pateon 01/13/20 at 12:21PM and received approval for admission today.  Admission Coordinator:  KRaechel Ache OT, time 12:21PM/Date 01/13/20   Assessment/Plan: Diagnosis:Debility post respiratory failure 1. Does the need for close, 24 hr/day Medical supervision in concert with  the patient's rehab needs make it unreasonable for this patient to be served in a less intensive setting? Yes 2. Co-Morbidities requiring supervision/potential complications: morbid obesity, HTN, DM 2 3. Due to bladder management, bowel management, safety, skin/wound care, disease management, medication administration, pain management and patient education, does the patient require 24 hr/day rehab nursing? Yes 4. Does the patient require coordinated care of a physician, rehab nurse, PT, OT, and SLP to address physical and functional deficits in the context of the above medical diagnosis(es)? Yes Addressing deficits in the following areas: balance, endurance, locomotion, strength, transferring, bowel/bladder control, bathing, dressing, feeding, grooming, toileting and  psychosocial support 5. Can the patient actively participate in an intensive therapy program of at least 3 hrs of therapy 5 days a week? Yes 6. The potential for patient to make measurable gains while on inpatient rehab is good 7. Anticipated functional outcomes upon discharge from inpatient rehab: modified independent PT, supervision OT, modified independent SLP 8. Estimated rehab length of stay to reach the above functional goals is: 12-16d 9. Anticipated discharge destination: Home 10. Overall Rehab/Functional Prognosis: good   MD Signature: [name] M.D. [facility] FAAPM&R (Neuromuscular Med) Diplomate Am Board of Electrodiagnostic Med Fellow Am Board of Interventional Pain

## 2020-01-01 NOTE — Progress Notes (Signed)
PROGRESS NOTE    JOREY DOLLARD  DGL:[id number] DOB: 13-Jul-1981 DOA: 12/04/2019 PCP: [facility] [place]    Brief Narrative:  Patient is a 39 year old with history of hypertension, diabetes type 2, morbid obesity who was admitted with acute respiratory failure from Covid pneumonia on 5/10 at [facility].  He failed high flow nasal cannula, BiPAP.  He developed ARDS and was intubated.  He was transferred to [facility] for evaluation by ophthalmology for proptosis, no intervention required.Hospital course was prolonged due to persistent respiratory failure, status post trach.  He has been hemodynamically stable on vent and now on Trach collar.   PCCM following.  Patient transferred to [facility] service on 12/28/2019.  Current plan is to move to LTAC.  Placement issue is ongoing. Events: 5/10 admitted [facility]  5/11 failed HHFNC, BiPAP. Required intubation 5/12 transferred to [facility], evaluated by opthal for proptosis- no intervention required 5/17 Started paralytics, ECMO team consulted Not a candidate 5/19 Started proning 5/23 Off pressors 5/26 Proning stopped, continue paralytics 5/27 New tongue wound 2/2 bite block in place, weaned off ketamine. Daily fevers 5/28 Trach, stop paralytics 6/1 Out of airborne isolation, family visiting, on PSV weans   Consultants:   PCCM  Procedures: ETT 5/11 >> 5/28 L femoral CVC 5/11 >> 5/17 R radial art line 5/17 >> 5/28 L IJ CVC 5/17 >> 6/1 Trach 5/28  Antimicrobials:  Remdesivir 5/11 >> 5/15 Dexamethasone 5/11 >> 5/20 Tocilizumab 5/12 Cefepime 5/17 >> 5/21 Linezolid 5/17 >> 5/23 Zosyn 5/21 >>  5/29 Vanco 5/28 >> 6/1   Subjective: Patient seen and examined.  Speech therapy by bedside.  On trach collar.  T-max 101 . Per speech, pt able to talk slowly now. This is my first day with the pt thus unable to compare Objective: Vitals:   01/01/20 0800 01/01/20 0903 01/01/20 1108 01/01/20 1157  BP: 98/63 109/77    Pulse: (!) 107 (!)  108 (!) 111   Resp: (!) 32  20   Temp:    98.4 F (36.9 C)  TempSrc:    Oral  SpO2: (!) 88% 94%    Weight:      Height:        Intake/Output Summary (Last 24 hours) at 01/01/2020 1237 Last data filed at 01/01/2020 0900 Gross per 24 hour  Intake 2385 ml  Output 3570 ml  Net -1185 ml   Filed Weights   12/30/19 0416 12/31/19 0421 01/01/20 0411  Weight: (!) 167.8 kg (!) 165.8 kg (!) 160.9 kg    Examination:  General exam: Appears calm and comfortable , on trach collar Respiratory system: anteriorly , scattered bibasilar rhonchi, no wheezing.  Cardiovascular system: S1 & S2 heard, RRR. No JVD, murmurs, rubs, gallops or clicks Gastrointestinal system: Abdomen is nondistended, soft and nontender. Normal bowel sounds heard. Central nervous system: Alert and oriented Extremities: no edema Skin: warm, dry     Data Reviewed: I have personally reviewed following labs and imaging studies  CBC: Recent Labs  Lab 12/27/19 0526 12/28/19 0456 12/29/19 0302 12/30/19 0009 01/01/20 0301  WBC 7.3 7.2 7.9 9.5 11.0*  NEUTROABS  --   --  5.0 6.8 7.8*  HGB 8.4* 7.7* 8.1* 8.4* 8.9*  HCT 30.6* 27.6* 28.3* 29.0* 30.7*  MCV 108.1* 106.6* 104.4* 103.6* 102.3*  PLT 167 155 151 149* 518*   Basic Metabolic Panel: Recent Labs  Lab 12/26/19 0241 12/26/19 0241 12/27/19 0526 12/28/19 0456 12/29/19 0302 12/31/19 0242 01/01/20 0301  NA 148*   < >  149* 145 145 140 138  K 3.5   < > 3.7 3.4* 3.6 3.8 3.6  CL 108   < > 105 104 101 96* 91*  CO2 29   < > 32 30 31 33* 34*  GLUCOSE 144*   < > 157* 149* 121* 134* 132*  BUN 45*   < > 35* 37* 34* 35* 36*  CREATININE 1.46*   < > 1.40* 1.50* 1.47* 1.39* 1.37*  CALCIUM 9.1   < > 9.3 8.9 9.0 9.1 9.4  MG 2.0  --  2.0 1.9  --  2.0  --   PHOS 4.3  --  4.4 5.0* 5.2*  --   --    < > = values in this interval not displayed.   GFR: Estimated Creatinine Clearance: 109.7 mL/min (A) (by C-G formula based on SCr of 1.37 mg/dL (H)). Liver Function Tests: No  results for input(s): AST, ALT, ALKPHOS, BILITOT, PROT, ALBUMIN in the last 168 hours. No results for input(s): LIPASE, AMYLASE in the last 168 hours. No results for input(s): AMMONIA in the last 168 hours. Coagulation Profile: No results for input(s): INR, PROTIME in the last 168 hours. Cardiac Enzymes: No results for input(s): CKTOTAL, CKMB, CKMBINDEX, TROPONINI in the last 168 hours. BNP (last 3 results) No results for input(s): PROBNP in the last 8760 hours. HbA1C: No results for input(s): HGBA1C in the last 72 hours. CBG: Recent Labs  Lab 12/31/19 1954 12/31/19 2343 01/01/20 0336 01/01/20 0720 01/01/20 1138  GLUCAP 110* 128* 141* 141* 137*   Lipid Profile: No results for input(s): CHOL, HDL, LDLCALC, TRIG, CHOLHDL, LDLDIRECT in the last 72 hours. Thyroid Function Tests: No results for input(s): TSH, T4TOTAL, FREET4, T3FREE, THYROIDAB in the last 72 hours. Anemia Panel: No results for input(s): VITAMINB12, FOLATE, FERRITIN, TIBC, IRON, RETICCTPCT in the last 72 hours. Sepsis Labs: Recent Labs  Lab 12/26/19 0241  PROCALCITON 0.70    Recent Results (from the past 240 hour(s))  Culture, respiratory (non-expectorated)     Status: None   Collection Time: 12/24/19  9:23 AM   Specimen: Tracheal Aspirate; Respiratory  Result Value Ref Range Status   Specimen Description TRACHEAL ASPIRATE  Final   Special Requests Normal  Final   Gram Stain   Final    ABUNDANT WBC PRESENT,BOTH PMN AND MONONUCLEAR ABUNDANT GRAM POSITIVE RODS Performed at [facility], [place] [address], [place], [place] [address]    Culture   Final    FEW PSEUDOMONAS AERUGINOSA FEW PROTEUS MIRABILIS    Report Status 12/26/2019 FINAL  Final   Organism ID, Bacteria PSEUDOMONAS AERUGINOSA  Final   Organism ID, Bacteria PROTEUS MIRABILIS  Final      Susceptibility   Pseudomonas aeruginosa - MIC*    CEFTAZIDIME 2 SENSITIVE Sensitive     CIPROFLOXACIN 2 INTERMEDIATE Intermediate     GENTAMICIN >=16  RESISTANT Resistant     IMIPENEM >=16 RESISTANT Resistant     PIP/TAZO <=4 SENSITIVE Sensitive     CEFEPIME <=1 SENSITIVE Sensitive     * FEW PSEUDOMONAS AERUGINOSA   Proteus mirabilis - MIC*    AMPICILLIN <=2 SENSITIVE Sensitive     CEFAZOLIN <=4 SENSITIVE Sensitive     CEFEPIME <=1 SENSITIVE Sensitive     CEFTAZIDIME <=1 SENSITIVE Sensitive     CEFTRIAXONE <=1 SENSITIVE Sensitive     CIPROFLOXACIN <=0.25 SENSITIVE Sensitive     GENTAMICIN <=1 SENSITIVE Sensitive     IMIPENEM 2 SENSITIVE Sensitive  TRIMETH/SULFA <=20 SENSITIVE Sensitive     AMPICILLIN/SULBACTAM <=2 SENSITIVE Sensitive     PIP/TAZO <=4 SENSITIVE Sensitive     * FEW PROTEUS MIRABILIS         Radiology Studies: No results found.      Scheduled Meds:  vitamin C  500 mg Per Tube Daily   chlorhexidine  15 mL Mouth Rinse BID   chlorhexidine gluconate (MEDLINE KIT)  15 mL Mouth Rinse BID   Chlorhexidine Gluconate Cloth  6 each Topical Daily   clonazePAM  1 mg Per Tube BID   collagenase   Topical Daily   enoxaparin (LOVENOX) injection  0.5 mg/kg Subcutaneous Q24H   famotidine  20 mg Per Tube BID   feeding supplement (PRO-STAT SUGAR FREE 64)  60 mL Per Tube TID   fentaNYL (SUBLIMAZE) injection  50 mcg Intravenous Once   free water  300 mL Per Tube Q8H   furosemide  40 mg Intravenous Daily   insulin aspart  0-20 Units Subcutaneous Q4H   insulin detemir  5 Units Subcutaneous BID   mouth rinse  15 mL Mouth Rinse q12n4p   nutrition supplement (JUVEN)  1 packet Per Tube BID BM   oxyCODONE  10 mg Per Tube Q6H   QUEtiapine  50 mg Per Tube BID   sodium chloride flush  10-40 mL Intracatheter Q12H   sodium chloride HYPERTONIC  4 mL Nebulization BID   zinc sulfate  220 mg Per Tube Daily   Continuous Infusions:  sodium chloride 11 mL/hr at 12/25/19 0341   sodium chloride 10 mL/hr at 12/28/19 0600   feeding supplement (VITAL 1.5 CAL) 1,000 mL (12/31/19 1700)    Assessment & Plan:     Active Problems:   HTN (hypertension)   Acute respiratory failure due to COVID-19 ([place])   Hyperglycemia   Diabetes ([place])   COVID-19   ARDS (adult respiratory distress syndrome) (HCC)   Acute respiratory failure (HCC)   Acute on chronic respiratory failure with hypoxia and hypercapnia (HCC)   Pressure injury of skin  Severe ARDS secondary to Covid pneumonia: Status post trach. He was on vent ,now on trach collar.  Continue PSV trials.  Follow intermittent chest x-ray.  Continue  IV Lasix daily for volume overload, we can send the Lasix to oral on discharge. He is on tube feeding, speech therapy is following. PCCM following Continue hypertonic saline and CPT Hoping will be candidate for CIR  Fever: Resolved.  Found to have multiple bacteria on BAL.  Case was previously discussed with ID, recommended to observe off antibiotics.  Tmax 101, Wbc up now. Will obtain ua and bcx.  Hypernatremia: Much improved.  Normocytic anemia:   Anemia is most likely associated with critical illness.  Continue to monitor. No transfusion necessary at this time   AKI: Currently improving and stable.  Morbid obesity: BMI of 59.1.  He might have possible underlying OSA.  Diabetes type 2: On sliding scale insulin and Levemir.  Last HbA1c of 6.6 Monitor CBGs  Disposition: Prolonged hospitalization.Initially recommended CIR by PT but current plan is to move him to LTAC.  Social worker closely following.  He was accepted by [facility] but family not agreeable and want CIR or [facility]    Pressure Injury 12/19/19 Other (Comment) Left Deep Tissue Pressure Injury - Purple or maroon localized area of discolored intact skin or blood-filled blister due to damage of underlying soft tissue from pressure and/or shear. DTI tongue (Active)  12/19/19 0315  Location: Other (  Comment) (toungue)  Location Orientation: Left  Staging: Deep Tissue Pressure Injury - Purple or maroon localized area of discolored intact skin or  blood-filled blister due to damage of underlying soft tissue from pressure and/or shear.  Wound Description (Comments): DTI tongue  Present on Admission: No     Pressure Injury 12/26/19 Sacrum Mid Stage 3 -  Full thickness tissue loss. Subcutaneous fat may be visible but bone, tendon or muscle are NOT exposed. (Active)  12/26/19 1300  Location: Sacrum  Location Orientation: Mid  Staging: Stage 3 -  Full thickness tissue loss. Subcutaneous fat may be visible but bone, tendon or muscle are NOT exposed.  Wound Description (Comments):   Present on Admission: No     Pressure Injury 12/26/19 Ear Right Stage 1 -  Intact skin with non-blanchable redness of a localized area usually over a bony prominence. (Active)  12/26/19 2000  Location: Ear  Location Orientation: Right  Staging: Stage 1 -  Intact skin with non-blanchable redness of a localized area usually over a bony prominence.  Wound Description (Comments):   Present on Admission: No    Nutrition Problem: Increased nutrient needs Etiology: acute illness(COVID-19 PNA)     DVT prophylaxis:Lovenox Code Status: Full Family Communication: None present at bedside Status is: Inpatient  Remains inpatient appropriate because:Unsafe d/c plan   Dispo: The patient is from: Home  Anticipated d/c is to: LTAC  Anticipated d/c date is: Unknown  Patient currently is stable for discharge to LTAC.       LOS: 28 days   Time spent: 45 min with >50% on coc    Nolberto Hanlon, MD [facility] Pager [phone]  If 7PM-7AM, please contact night-coverage [email or web address] Password [facility] 01/01/2020, 12:37 PM

## 2020-01-01 NOTE — Progress Notes (Signed)
NAME:  Clifford Nichols, MRN:  [id number], DOB:  09-10-1980, LOS: 58 [facility] ADMISSION DATE:  12/04/2019, [facility] Admission DATE:  12/04/19 REFERRING MD:  Dr Mortimer Fries, CCM, CHIEF COMPLAINT:  Acute respiratory failure   Brief History   39 year old obese man with hypertension, diabetes admitted with acute respiratory failure, ARDS from COVID-19 pneumonia, initial positive test 5/10  Past Medical History    has a past medical history of Abscess of upper back excluding scapular region, Diabetes mellitus without complication ([place]), GERD (gastroesophageal reflux disease), Hypertension, and Sebaceous cyst.  Significant Hospital Events   5/10 admitted [facility]  5/11 failed HHFNC, BiPAP.  Required intubation 5/12 transferred to [facility], evaluated by opthal for proptosis- no intervention required 5/17 Started paralytics, ECMO team consulted Not a candidate 5/19 Started proning 5/23 Off pressors 5/26 Proning stopped, continue paralytics 5/27 New tongue wound 2/2 bite block in place, weaned off ketamine. Daily fevers 5/28 Trach, stop paralytics 6/1 Out of airborne isolation, family visiting, on PSV weans 6/6 Tolerated ATC yesterday for 12 hrs 6/7 remained on ATC  Consults:  Ophthalmology for proptosis 5/12  Procedures:  ETT 5/11 >> 5/28 L femoral CVC 5/11 >> 5/17 R radial art line 5/17 >> 5/28 L IJ CVC 5/17 >> 6/1  Trach 5/28  Significant Diagnostic Tests:  Head CT 5/11 >> no significant intracranial abnormality.  Scattered opacification and air-fluid levels in the frontal, ethmoid, sphenoid and maxillary sinuses of unclear significance.  Head CT 5/12 >> no significant intracranial abnormality  Lower extremity Doppler 5/14 >> negative for DVT  Micro Data:  SARS CoV2 5/10 >> positive MRSA screen 5/12 >> negative Respiratory 5/12 >> negative Blood 5/17 >> Staph epi and Staph hominis Respiratory 5/17 >> Staph epi 5/24 resp cx bal: staph epi 5/24 BAL acid fast: smear negative cx pending 5/24 BAL  fungal:pending 5/26 blood: No growth BAL 5/28-Acinetobacter, Proteus, Pseudomonas, Enterococcus Trach asp 6/1- Pseudomonas  Antimicrobials:  Remdesivir 5/11 >> 5/15 Dexamethasone 5/11 >> 5/20 Tocilizumab 5/12 Cefepime 5/17 >> 5/21 Linezolid 5/17 >> 5/23 Zosyn 5/21 >>  5/29 Vanco 5/28 >> 6/1  Interim history/subjective:  No events, has some burning pain in feet.  Objective   Blood pressure 115/77, pulse (!) 109, temperature 98.1 F (36.7 C), temperature source Oral, resp. rate (!) 32, height _0  (1.702 m), weight (!) 160.9 kg, SpO2 91 %.    FiO2 (%):  [35 %-40 %] 35 %   Intake/Output Summary (Last 24 hours) at 01/01/2020 0750 Last data filed at 01/01/2020 0700 Gross per 24 hour  Intake 2430 ml  Output 4895 ml  Net -2465 ml   Filed Weights   12/30/19 0416 12/31/19 0421 01/01/20 0411  Weight: (!) 167.8 kg (!) 165.8 kg (!) 160.9 kg    Examination: General:  Morbidly obese male sitting upright in bed in NAD HEENT: trach in place, moderate thick green secretions Neuro: Awake, mouths words, moves all 4 ext CV: tachycardic, regular, ext warm PULM:  Non labored, scattered rhonchi, diminished in bases GI: obese, soft, NT  Extremities: warm/dry, no LE edema  Skin: no rashes   Labs stable Neg 2.5L  Resolved Hospital Problem list    Left proptosis, noted with coughing early 5/12, resolved.  Apparently per wife he has episodic intermittent proptosis at home as well. Head CT 5/12 reassuring Ophthalmology did evaluate, recommended that he will need outpatient evaluation but no intervention at this time beyond keeping the eyes moist.  Septic shock, present on admission. Tongue necrosis secondary to biting down  Hypertriglyceridemia Acinetobacter HAP Acute toxic metabolic encephalopathy  Assessment & Plan:   Severe ARDS due to COVID-19 pneumonia  S/p trach  Morbid obesity, possible OSA - switch to 6-0 shiley regular size (longer=more resistance), see if tolerates -  progressive mobility - push diuresis as tolerated by renal function - continue hypertonic saline and CPT - hopefully a candidate for CIR  [name] MD [facility] 01/01/2020 7:57 AM Personal pager: [phone] If unanswered, please page CCM On-call: [phone]

## 2020-01-01 NOTE — Progress Notes (Signed)
Physical Therapy Evaluation Patient Details Name: Clifford Nichols MRN: [id number] DOB: 1980/10/16 Today's Date: 01/01/2020   History of Present Illness  39 year old obese man with hypertension, diabetes, admitted 12/02/19 to [facility] with acute respiratory failure, ARDS from COVID-19 pneumonia, initial positive test 5/10. Intubated 5/11, required paralytics, proning, pressors, tracheostomy 5/28; Head CT 5/12 no abnormality. Seen by opthalmology due to left eye proptosis (no intervention recommended). Tongue necrosis due to pt biting down when orally intubated  Clinical Impression  Pt progressing well toward goals.  Goals updated.  Emphasis on warm up/strengthening exercises, transtion to EOB, scooting, sit to stand with focus on hand placement and safety, pre gait and progressing gait.     Follow Up Recommendations CIR    Equipment Recommendations  Other (comment)    Recommendations for Other Services       Precautions / Restrictions Precautions Precautions: Fall Precaution Comments: watch RR, HR, sats      Mobility  Bed Mobility Overal bed mobility: Needs Assistance Bed Mobility: Rolling Rolling: Min guard(with rail)   Supine to sit: Min guard     General bed mobility comments: use of the rail, mild struggle, but no assist  Transfers Overall transfer level: Needs assistance Equipment used: Rolling walker (2 wheeled) Transfers: Sit to/from Stand Sit to Stand: +2 physical assistance;Min assist         General transfer comment: pt requires cues for hand placement and cues for scooting to the front of chair. Pt showing ability to power up with legs  Ambulation/Gait Ambulation/Gait assistance: Mod assist;+2 physical assistance Gait Distance (Feet): 4 Feet(the 5 feet with rest in between) Assistive device: Rolling walker (2 wheeled) Gait Pattern/deviations: Step-to pattern;Decreased stride length     General Gait Details: Eradic, variable steps.  Pt with weak df and  supinating each foot and dragging the toes through to advance legs.  Significant w/shift assist /support needed.  Stairs            Wheelchair Mobility    Modified Rankin (Stroke Patients Only)       Balance Overall balance assessment: Needs assistance Sitting-balance support: Bilateral upper extremity supported;Feet supported;No upper extremity supported Sitting balance-Leahy Scale: Fair     Standing balance support: Bilateral upper extremity supported;During functional activity Standing balance-Leahy Scale: Poor Standing balance comment: relies on bil UE on RW                             Pertinent Vitals/Pain Pain Assessment: Faces Pain Score: 0-No pain    Home Living                        Prior Function                 Hand Dominance        Extremity/Trunk Assessment                Communication      Cognition Arousal/Alertness: Awake/alert Behavior During Therapy: WFL for tasks assessed/performed Overall Cognitive Status: Within Functional Limits for tasks assessed                                        General Comments      Exercises General Exercises - Lower Extremity Heel Slides: (strong resistance in gross extension) Other Exercises Other Exercises: bicep/tricep presses, resisted  x 10 reps bil   Assessment/Plan    PT Assessment    PT Problem List         PT Treatment Interventions      PT Goals (Current goals can be found in the Care Plan section)  Acute Rehab PT Goals Patient Stated Goal: to do leg exercises PT Goal Formulation: With patient Time For Goal Achievement: 01/06/20 Potential to Achieve Goals: Good    Frequency Min 4X/week   Barriers to discharge        Co-evaluation PT/OT/SLP Co-Evaluation/Treatment: Yes             AM-PAC PT "6 Clicks" Mobility  Outcome Measure Help needed turning from your back to your side while in a flat bed without using bedrails?: A  Little Help needed moving from lying on your back to sitting on the side of a flat bed without using bedrails?: A Little Help needed moving to and from a bed to a chair (including a wheelchair)?: A Lot Help needed standing up from a chair using your arms (e.g., wheelchair or bedside chair)?: A Lot Help needed to walk in hospital room?: A Lot Help needed climbing 3-5 steps with a railing? : Total 6 Click Score: 13    End of Session Equipment Utilized During Treatment: Oxygen Activity Tolerance: Patient tolerated treatment well Patient left: in chair;with call bell/phone within reach;with family/visitor present Nurse Communication: Mobility status;Need for lift equipment PT Visit Diagnosis: Muscle weakness (generalized) (M62.81);Other symptoms and signs involving the nervous system (R29.898);Difficulty in walking, not elsewhere classified (R26.2)    Time: 1884-1660 PT Time Calculation (min) (ACUTE ONLY): 36 min   Charges:     PT Treatments $Gait Training: 8-22 mins $Therapeutic Exercise: 8-22 mins        01/01/2020  Jacinto Halim., PT Acute Rehabilitation Services [phone]  (pager) [phone]  (office)  Eliseo Gum Merina Behrendt 01/01/2020, 4:33 PM

## 2020-01-01 NOTE — Progress Notes (Signed)
Assisted tele visit to patient with wife.  Alexiz Cothran D Copelyn Widmer, RN   

## 2020-01-01 NOTE — Progress Notes (Signed)
Inpatient Rehabilitation-Admissions Coordinator   Met with pt bedside. Pt was up in the chair and had been for over 5 hours today. He is still wanting to pursue CIR at this time.   Per RN, his trach was downsized today. FiO2 still remains at 60%. Pt would need to tolerate 35% FiO2 and continue to manage secretions prior to possible CIR admission. Discussed case with PM&R MD who states as long as those parameters are met, pt is an appropriate candidate.   AC spoke with pt's wife (with his permission) about CIR program. She has confirmed DC support and is in favor of CIR. I discussed that we will continue to follow his progress with therapies and his tolerance of the above criteria prior to possible admission. Will also need to seek insurance approval prior to placement.   Will continue to follow.   Please call if questions.   Raechel Ache, OTR/L  Rehab Admissions Coordinator  [phone] 01/01/2020 1:30 PM

## 2020-01-02 ENCOUNTER — Inpatient Hospital Stay (HOSPITAL_COMMUNITY): Payer: No Typology Code available for payment source

## 2020-01-02 LAB — GLUCOSE, CAPILLARY
Glucose-Capillary: 102 mg/dL — ABNORMAL HIGH (ref 70–99)
Glucose-Capillary: 138 mg/dL — ABNORMAL HIGH (ref 70–99)
Glucose-Capillary: 138 mg/dL — ABNORMAL HIGH (ref 70–99)
Glucose-Capillary: 139 mg/dL — ABNORMAL HIGH (ref 70–99)
Glucose-Capillary: 143 mg/dL — ABNORMAL HIGH (ref 70–99)
Glucose-Capillary: 152 mg/dL — ABNORMAL HIGH (ref 70–99)

## 2020-01-02 LAB — BASIC METABOLIC PANEL
Anion gap: 12 (ref 5–15)
BUN: 43 mg/dL — ABNORMAL HIGH (ref 6–20)
CO2: 37 mmol/L — ABNORMAL HIGH (ref 22–32)
Calcium: 9.3 mg/dL (ref 8.9–10.3)
Chloride: 88 mmol/L — ABNORMAL LOW (ref 98–111)
Creatinine, Ser: 1.37 mg/dL — ABNORMAL HIGH (ref 0.61–1.24)
GFR calc Af Amer: >60 mL/min (ref >60)
GFR calc non Af Amer: >60 mL/min (ref >60)
Glucose, Bld: 154 mg/dL — ABNORMAL HIGH (ref 70–99)
Potassium: 3.6 mmol/L (ref 3.5–5.1)
Sodium: 137 mmol/L (ref 135–145)

## 2020-01-02 MED ORDER — POTASSIUM CHLORIDE 20 MEQ/15ML (10%) PO SOLN
20.0000 meq | Freq: Every day | ORAL | Status: DC
  Administered 2020-01-02 – 2020-01-11 (×10): 20 meq
  Filled 2020-01-02 (×10): qty 15

## 2020-01-02 MED ORDER — POTASSIUM CHLORIDE 20 MEQ/15ML (10%) PO SOLN: 20.0000 meq | Freq: Once | ORAL | Status: DC

## 2020-01-02 MED ORDER — POTASSIUM CHLORIDE 20 MEQ/15ML (10%) PO SOLN
40.0000 meq | Freq: Once | ORAL | Status: AC
  Administered 2020-01-02: 40 meq
  Filled 2020-01-02: qty 30

## 2020-01-02 MED ORDER — POTASSIUM CHLORIDE 20 MEQ/15ML (10%) PO SOLN: 40.0000 meq | Freq: Every day | ORAL | Status: DC

## 2020-01-02 NOTE — Progress Notes (Signed)
Physical Therapy Treatment Patient Details Name: Clifford Nichols MRN: [id number] DOB: 01/04/1981 Today's Date: 01/02/2020    History of Present Illness 39 year old obese man with hypertension, diabetes, admitted 12/02/19 to [facility] with acute respiratory failure, ARDS from COVID-19 pneumonia, initial positive test 5/10. Intubated 5/11, required paralytics, proning, pressors, tracheostomy 5/28; Head CT 5/12 no abnormality. Seen by opthalmology due to left eye proptosis (no intervention recommended). Tongue necrosis due to pt biting down when orally intubated    PT Comments    Pt is always interested and motivated.  Pt concerned about no movement of his feet as expected.  Pt making good progress toward goals.  Emphasis on sit to stand, pregait with w/shifting and advancing feet and gait.   Follow Up Recommendations  CIR     Equipment Recommendations  Other (comment)    Recommendations for Other Services       Precautions / Restrictions Precautions Precautions: Fall Precaution Comments: watch RR, HR, sats Restrictions Weight Bearing Restrictions: No    Mobility  Bed Mobility Overal bed mobility: Needs Assistance Bed Mobility: Sit to Supine Rolling: Min guard     Sit to supine: Mod assist   General bed mobility comments: pt now using a lot more momentum, but legs still difficult to get back up into bed.  Transfers Overall transfer level: Needs assistance Equipment used: Rolling walker (2 wheeled) Transfers: Sit to/from Stand Sit to Stand: Min assist;+2 physical assistance         General transfer comment: min for lower surfaces with arm rests or bed height  Ambulation/Gait Ambulation/Gait assistance: Mod assist;+2 safety/equipment   Assistive device: Rolling walker (2 wheeled) Gait Pattern/deviations: Step-to pattern Gait velocity: slower and uncoordinated   General Gait Details: sidestepping with w/shift and stability assist.  Cues for sequencing   Stairs              Wheelchair Mobility    Modified Rankin (Stroke Patients Only)       Balance Overall balance assessment: Needs assistance Sitting-balance support: Bilateral upper extremity supported;No upper extremity supported;Feet supported Sitting balance-Leahy Scale: Fair     Standing balance support: Bilateral upper extremity supported;During functional activity Standing balance-Leahy Scale: Poor Standing balance comment: relies on bil UE on RW                            Cognition Arousal/Alertness: Awake/alert Behavior During Therapy: WFL for tasks assessed/performed Overall Cognitive Status: Within Functional Limits for tasks assessed                                        Exercises      General Comments General comments (skin integrity, edema, etc.): HR 135, VSS,  no overt df/pf      Pertinent Vitals/Pain Faces Pain Scale: No hurt    Home Living                      Prior Function            PT Goals (current goals can now be found in the care plan section) Acute Rehab PT Goals Patient Stated Goal: get stronger, get to go to rehab PT Goal Formulation: With patient Time For Goal Achievement: 01/06/20 Potential to Achieve Goals: Good Progress towards PT goals: Progressing toward goals    Frequency    Min 4X/week  PT Plan Current plan remains appropriate    Co-evaluation              AM-PAC PT "6 Clicks" Mobility   Outcome Measure  Help needed turning from your back to your side while in a flat bed without using bedrails?: A Little Help needed moving from lying on your back to sitting on the side of a flat bed without using bedrails?: A Little Help needed moving to and from a bed to a chair (including a wheelchair)?: A Lot Help needed standing up from a chair using your arms (e.g., wheelchair or bedside chair)?: A Lot Help needed to walk in hospital room?: A Lot Help needed climbing 3-5 steps with a  railing? : Total 6 Click Score: 13    End of Session Equipment Utilized During Treatment: Oxygen Activity Tolerance: Patient tolerated treatment well Patient left: in chair;with call bell/phone within reach;with family/visitor present Nurse Communication: Mobility status PT Visit Diagnosis: Unsteadiness on feet (R26.81);Other abnormalities of gait and mobility (R26.89);Muscle weakness (generalized) (M62.81);Difficulty in walking, not elsewhere classified (R26.2)     Time: 7035-0093 PT Time Calculation (min) (ACUTE ONLY): 26 min  Charges:  $Gait Training: 8-22 mins $Therapeutic Activity: 8-22 mins                     01/02/2020  [name], PT Acute Rehabilitation Services [phone]  (pager) [phone]  (office)   Clifford Nichols 01/02/2020, 10:46 PM

## 2020-01-02 NOTE — Progress Notes (Addendum)
PROGRESS NOTE    Clifford Nichols  WUJ:[id number] DOB: 1980-08-08 DOA: 12/04/2019 PCP: [facility] [place] [facility]    Brief Narrative:  Patient is a 39 year old with history of hypertension, diabetes type 2, morbid obesity who was admitted with acute respiratory failure from Covid pneumonia on 5/10 at [facility].  He failed high flow nasal cannula, BiPAP.  He developed ARDS and was intubated.  He was transferred to [facility] for evaluation by ophthalmology for proptosis, no intervention required.Hospital course was prolonged due to persistent respiratory failure, status post trach.  He has been hemodynamically stable on vent and now on Trach collar.   PCCM following.  Patient transferred to [facility] service on 12/28/2019.  Current plan is to move to LTAC.  Placement issue is ongoing. Events: 5/10 admitted [facility]  5/11 failed HHFNC, BiPAP. Required intubation 5/12 transferred to [facility], evaluated by opthal for proptosis- no intervention required 5/17 Started paralytics, ECMO team consulted Not a candidate 5/19 Started proning 5/23 Off pressors 5/26 Proning stopped, continue paralytics 5/27 New tongue wound 2/2 bite block in place, weaned off ketamine. Daily fevers 5/28 Trach, stop paralytics 6/1 Out of airborne isolation, family visiting, on PSV weans   Consultants:   PCCM  Procedures: ETT 5/11 >> 5/28 L femoral CVC 5/11 >> 5/17 R radial art line 5/17 >> 5/28 L IJ CVC 5/17 >> 6/1 Trach 5/28  Antimicrobials:  Remdesivir 5/11 >> 5/15 Dexamethasone 5/11 >> 5/20 Tocilizumab 5/12 Cefepime 5/17 >> 5/21 Linezolid 5/17 >> 5/23 Zosyn 5/21 >>  5/29 Vanco 5/28 >> 6/1   Subjective:  Objective: Vitals:   01/02/20 0700 01/02/20 0730 01/02/20 0740 01/02/20 0741  BP: 107/77     Pulse: (!) 109  (!) 110   Resp:   (!) 25   Temp:  98.1 F (36.7 C)    TempSrc:  Oral    SpO2: 100%  100% 100%  Weight:      Height:        Intake/Output Summary (Last 24 hours) at 01/02/2020  0811 Last data filed at 01/02/2020 0700 Gross per 24 hour  Intake 780 ml  Output 1900 ml  Net -1120 ml   Filed Weights   12/30/19 0416 12/31/19 0421 01/01/20 0411  Weight: (!) 167.8 kg (!) 165.8 kg (!) 160.9 kg    Examination:  General exam: Appears calm and comfortable , on trach collar, answers questions. Respiratory system: anteriorly , scattered bibasilar rhonchi, no wheezing.  Cardiovascular system: S1 & S2 heard, RRR. No JVD, murmurs, rubs, gallops or clicks Gastrointestinal system: Abdomen is nondistended, soft and nontender. Normal bowel sounds heard. Central nervous system: Alert and oriented Extremities: no edema Skin: warm, dry     Data Reviewed: I have personally reviewed following labs and imaging studies  CBC: Recent Labs  Lab 12/27/19 0526 12/28/19 0456 12/29/19 0302 12/30/19 0009 01/01/20 0301  WBC 7.3 7.2 7.9 9.5 11.0*  NEUTROABS  --   --  5.0 6.8 7.8*  HGB 8.4* 7.7* 8.1* 8.4* 8.9*  HCT 30.6* 27.6* 28.3* 29.0* 30.7*  MCV 108.1* 106.6* 104.4* 103.6* 102.3*  PLT 167 155 151 149* 956*   Basic Metabolic Panel: Recent Labs  Lab 12/27/19 0526 12/27/19 0526 12/28/19 0456 12/29/19 0302 12/31/19 0242 01/01/20 0301 01/02/20 0333  NA 149*   < > 145 145 140 138 137  K 3.7   < > 3.4* 3.6 3.8 3.6 3.6  CL 105   < > 104 101 96* 91* 88*  CO2 32   < >  30 31 33* 34* 37*  GLUCOSE 157*   < > 149* 121* 134* 132* 154*  BUN 35*   < > 37* 34* 35* 36* 43*  CREATININE 1.40*   < > 1.50* 1.47* 1.39* 1.37* 1.37*  CALCIUM 9.3   < > 8.9 9.0 9.1 9.4 9.3  MG 2.0  --  1.9  --  2.0  --   --   PHOS 4.4  --  5.0* 5.2*  --   --   --    < > = values in this interval not displayed.   GFR: Estimated Creatinine Clearance: 109.7 mL/min (A) (by C-G formula based on SCr of 1.37 mg/dL (H)). Liver Function Tests: No results for input(s): AST, ALT, ALKPHOS, BILITOT, PROT, ALBUMIN in the last 168 hours. No results for input(s): LIPASE, AMYLASE in the last 168 hours. No results for  input(s): AMMONIA in the last 168 hours. Coagulation Profile: No results for input(s): INR, PROTIME in the last 168 hours. Cardiac Enzymes: No results for input(s): CKTOTAL, CKMB, CKMBINDEX, TROPONINI in the last 168 hours. BNP (last 3 results) No results for input(s): PROBNP in the last 8760 hours. HbA1C: No results for input(s): HGBA1C in the last 72 hours. CBG: Recent Labs  Lab 01/01/20 1546 01/01/20 1940 01/01/20 2352 01/02/20 0425 01/02/20 0732  GLUCAP 134* 128* 140* 143* 138*   Lipid Profile: No results for input(s): CHOL, HDL, LDLCALC, TRIG, CHOLHDL, LDLDIRECT in the last 72 hours. Thyroid Function Tests: No results for input(s): TSH, T4TOTAL, FREET4, T3FREE, THYROIDAB in the last 72 hours. Anemia Panel: No results for input(s): VITAMINB12, FOLATE, FERRITIN, TIBC, IRON, RETICCTPCT in the last 72 hours. Sepsis Labs: No results for input(s): PROCALCITON, LATICACIDVEN in the last 168 hours.  Recent Results (from the past 240 hour(s))  Culture, respiratory (non-expectorated)     Status: None   Collection Time: 12/24/19  9:23 AM   Specimen: Tracheal Aspirate; Respiratory  Result Value Ref Range Status   Specimen Description TRACHEAL ASPIRATE  Final   Special Requests Normal  Final   Gram Stain   Final    ABUNDANT WBC PRESENT,BOTH PMN AND MONONUCLEAR ABUNDANT GRAM POSITIVE RODS Performed at [facility], [place] [address], [place], [place] [address]    Culture   Final    FEW PSEUDOMONAS AERUGINOSA FEW PROTEUS MIRABILIS    Report Status 12/26/2019 FINAL  Final   Organism ID, Bacteria PSEUDOMONAS AERUGINOSA  Final   Organism ID, Bacteria PROTEUS MIRABILIS  Final      Susceptibility   Pseudomonas aeruginosa - MIC*    CEFTAZIDIME 2 SENSITIVE Sensitive     CIPROFLOXACIN 2 INTERMEDIATE Intermediate     GENTAMICIN >=16 RESISTANT Resistant     IMIPENEM >=16 RESISTANT Resistant     PIP/TAZO <=4 SENSITIVE Sensitive     CEFEPIME <=1 SENSITIVE Sensitive     * FEW  PSEUDOMONAS AERUGINOSA   Proteus mirabilis - MIC*    AMPICILLIN <=2 SENSITIVE Sensitive     CEFAZOLIN <=4 SENSITIVE Sensitive     CEFEPIME <=1 SENSITIVE Sensitive     CEFTAZIDIME <=1 SENSITIVE Sensitive     CEFTRIAXONE <=1 SENSITIVE Sensitive     CIPROFLOXACIN <=0.25 SENSITIVE Sensitive     GENTAMICIN <=1 SENSITIVE Sensitive     IMIPENEM 2 SENSITIVE Sensitive     TRIMETH/SULFA <=20 SENSITIVE Sensitive     AMPICILLIN/SULBACTAM <=2 SENSITIVE Sensitive     PIP/TAZO <=4 SENSITIVE Sensitive     * FEW PROTEUS MIRABILIS  Radiology Studies: No results found.      Scheduled Meds:  vitamin C  500 mg Per Tube Daily   chlorhexidine  15 mL Mouth Rinse BID   chlorhexidine gluconate (MEDLINE KIT)  15 mL Mouth Rinse BID   Chlorhexidine Gluconate Cloth  6 each Topical Daily   clonazePAM  1 mg Per Tube BID   collagenase   Topical Daily   enoxaparin (LOVENOX) injection  0.5 mg/kg Subcutaneous Q24H   famotidine  20 mg Per Tube BID   feeding supplement (PRO-STAT SUGAR FREE 64)  60 mL Per Tube TID   fentaNYL (SUBLIMAZE) injection  50 mcg Intravenous Once   free water  300 mL Per Tube Q8H   furosemide  40 mg Intravenous Daily   insulin aspart  0-20 Units Subcutaneous Q4H   insulin detemir  5 Units Subcutaneous BID   mouth rinse  15 mL Mouth Rinse q12n4p   nutrition supplement (JUVEN)  1 packet Per Tube BID BM   oxyCODONE  10 mg Per Tube Q6H   QUEtiapine  50 mg Per Tube BID   sodium chloride flush  10-40 mL Intracatheter Q12H   sodium chloride HYPERTONIC  4 mL Nebulization BID   zinc sulfate  220 mg Per Tube Daily   Continuous Infusions:  sodium chloride 11 mL/hr at 12/25/19 0341   sodium chloride 10 mL/hr at 12/28/19 0600   feeding supplement (VITAL 1.5 CAL) 1,000 mL (01/01/20 1314)    Assessment & Plan:   Active Problems:   HTN (hypertension)   Acute respiratory failure due to COVID-19 ([place])   Hyperglycemia   Diabetes ([place])   COVID-19   ARDS  (adult respiratory distress syndrome) (HCC)   Acute respiratory failure (HCC)   Acute on chronic respiratory failure with hypoxia and hypercapnia (HCC)   Pressure injury of skin  Severe ARDS secondary to Covid pneumonia: Status post trach. He was on vent ,now on trach collar.  Continue PSV trials.   Per pccm-. check CXR may need to switch from 6-0 to 6-0 prox XLT to prevent night-time desaturations Continue  IV Lasix daily for volume overload, we can send the Lasix to oral on discharge. He is on tube feeding, speech therapy is following. PCCM following Continue hypertonic saline and CPT Hoping will be candidate for CIR  Fever: Resolved.  Found to have multiple bacteria on BAL.  Case was previously discussed with ID, recommended to observe off antibiotics.  Tmax 101, Wbc up now. Will obtain ua and bcx.  Hypernatremia: Much improved.  Normocytic anemia:   Anemia is most likely associated with critical illness.  Continue to monitor. No transfusion necessary at this time   AKI: Currently improving and stable.  Morbid obesity: BMI of 59.1.  He might have possible underlying OSA.  Diabetes type 2: On sliding scale insulin and Levemir.  Last HbA1c of 6.6 Monitor CBGs  Disposition: Prolonged hospitalization.Initially recommended CIR by PT but current plan is to move him to LTAC.  Social worker closely following.  He was accepted by [facility] but family not agreeable and want CIR or [facility]    Pressure Injury 12/19/19 Other (Comment) Left Deep Tissue Pressure Injury - Purple or maroon localized area of discolored intact skin or blood-filled blister due to damage of underlying soft tissue from pressure and/or shear. DTI tongue (Active)  12/19/19 0315  Location: Other (Comment) (toungue)  Location Orientation: Left  Staging: Deep Tissue Pressure Injury - Purple or maroon localized area of discolored intact skin or  blood-filled blister due to damage of underlying soft tissue from pressure  and/or shear.  Wound Description (Comments): DTI tongue  Present on Admission: No     Pressure Injury 12/26/19 Sacrum Mid Stage 3 -  Full thickness tissue loss. Subcutaneous fat may be visible but bone, tendon or muscle are NOT exposed. (Active)  12/26/19 1300  Location: Sacrum  Location Orientation: Mid  Staging: Stage 3 -  Full thickness tissue loss. Subcutaneous fat may be visible but bone, tendon or muscle are NOT exposed.  Wound Description (Comments):   Present on Admission: No     Pressure Injury 12/26/19 Ear Right Stage 1 -  Intact skin with non-blanchable redness of a localized area usually over a bony prominence. (Active)  12/26/19 2000  Location: Ear  Location Orientation: Right  Staging: Stage 1 -  Intact skin with non-blanchable redness of a localized area usually over a bony prominence.  Wound Description (Comments):   Present on Admission: No    Nutrition Problem: Increased nutrient needs Etiology: acute illness(COVID-19 PNA)     DVT prophylaxis:Lovenox Code Status: Full Family Communication: None present at bedside Status is: Inpatient  Remains inpatient appropriate because:Unsafe d/c plan   Dispo: The patient is from: Home  Anticipated d/c is to: LTAC  Anticipated d/c date is: Unknown  Patient currently is stable for discharge to LTAC.   ADDENDUM: I ordered ucx but  was cancelled by PCCM.    LOS: 29 days   Time spent: 45 min with >50% on coc    Nolberto Hanlon, MD [facility] Pager [phone]  If 7PM-7AM, please contact night-coverage [email or web address] Password [facility] 01/02/2020, 8:11 AM Patient ID: Clifford Nichols, male   DOB: June 18, 1981, 39 y.o.   MRN: [id number]

## 2020-01-02 NOTE — Procedures (Addendum)
Tracheostomy Change Note  Patient Details:   Name: Clifford Nichols DOB: 03-Feb-1981 MRN: [id number]    Airway Documentation:     Evaluation  O2 sats: stable throughout Complications: No apparent complications Patient did tolerate procedure well. Bilateral Breath Sounds: Rhonchi, Diminished    Patients trach changed per MD order from #6 shiley cuffless to a #6 XLT proximal cuffless trach. Good color change on CO2. Vitals stable throughout the procedure. Janina Mayo ties new and secure.  Icy Fuhrmann H Ryszard Socarras 01/02/2020, 2:50 PM

## 2020-01-02 NOTE — Progress Notes (Signed)
NAME:  Clifford Nichols, MRN:  [id number], DOB:  [birth year], LOS: 32 [facility] ADMISSION DATE:  12/04/2019, [facility] Admission DATE:  12/04/19 REFERRING MD:  Dr Mortimer Fries, CCM, CHIEF COMPLAINT:  Acute respiratory failure   Brief History   39 year old obese man with hypertension, diabetes admitted with acute respiratory failure, ARDS from COVID-19 pneumonia, initial positive test 5/10  Past Medical History    has a past medical history of Abscess of upper back excluding scapular region, Diabetes mellitus without complication ([place]), GERD (gastroesophageal reflux disease), Hypertension, and Sebaceous cyst.  Significant Hospital Events   5/10 admitted [facility]  5/11 failed HHFNC, BiPAP.  Required intubation 5/12 transferred to [facility], evaluated by opthal for proptosis- no intervention required 5/17 Started paralytics, ECMO team consulted Not a candidate 5/19 Started proning 5/23 Off pressors 5/26 Proning stopped, continue paralytics 5/27 New tongue wound 2/2 bite block in place, weaned off ketamine. Daily fevers 5/28 Trach, stop paralytics 6/1 Out of airborne isolation, family visiting, on PSV weans 6/6 Tolerated ATC yesterday for 12 hrs 6/7 remained on ATC  Consults:  Ophthalmology for proptosis 5/12  Procedures:  ETT 5/11 >> 5/28 L femoral CVC 5/11 >> 5/17 R radial art line 5/17 >> 5/28 L IJ CVC 5/17 >> 6/1  Trach 5/28  Significant Diagnostic Tests:  Head CT 5/11 >> no significant intracranial abnormality.  Scattered opacification and air-fluid levels in the frontal, ethmoid, sphenoid and maxillary sinuses of unclear significance.  Head CT 5/12 >> no significant intracranial abnormality  Lower extremity Doppler 5/14 >> negative for DVT  Micro Data:  SARS CoV2 5/10 >> positive MRSA screen 5/12 >> negative Respiratory 5/12 >> negative Blood 5/17 >> Staph epi and Staph hominis Respiratory 5/17 >> Staph epi 5/24 resp cx bal: staph epi 5/24 BAL acid fast: smear negative cx pending 5/24 BAL  fungal:pending 5/26 blood: No growth BAL 5/28-Acinetobacter, Proteus, Pseudomonas, Enterococcus Trach asp 6/1- Pseudomonas  Antimicrobials:  Remdesivir 5/11 >> 5/15 Dexamethasone 5/11 >> 5/20 Tocilizumab 5/12 Cefepime 5/17 >> 5/21 Linezolid 5/17 >> 5/23 Zosyn 5/21 >>  5/29 Vanco 5/28 >> 6/1  Interim history/subjective:  Sounds like occluding trach at night resulting in FiO2 being increased. Denies pain.  Objective   Blood pressure 107/77, pulse (!) 110, temperature 98.1 F (36.7 C), temperature source Oral, resp. rate (!) 25, height _0  (1.702 m), weight (!) 160.9 kg, SpO2 100 %.    FiO2 (%):  [40 %-100 %] 40 %   Intake/Output Summary (Last 24 hours) at 01/02/2020 0831 Last data filed at 01/02/2020 0800 Gross per 24 hour  Intake 835 ml  Output 2100 ml  Net -1265 ml   Filed Weights   12/30/19 0416 12/31/19 0421 01/01/20 0411  Weight: (!) 167.8 kg (!) 165.8 kg (!) 160.9 kg    Examination: General:  Morbidly obese male sitting upright in bed in NAD HEENT: trach in place, secretions less today Neuro: Awake, mouths words, moves all 4 ext CV: tachycardic, regular, ext warm PULM:  Non labored, scattered rhonchi, diminished in bases GI: obese, soft, NT  Extremities: warm/dry, no LE edema  Skin: no rashes   Labs stable Neg 1.3L  Resolved Hospital Problem list    Left proptosis, noted with coughing early 5/12, resolved.  Apparently per wife he has episodic intermittent proptosis at home as well. Head CT 5/12 reassuring Ophthalmology did evaluate, recommended that he will need outpatient evaluation but no intervention at this time beyond keeping the eyes moist.  Septic shock, present on admission. Tongue necrosis  secondary to biting down Hypertriglyceridemia Acinetobacter HAP Acute toxic metabolic encephalopathy  Assessment & Plan:   Severe ARDS due to COVID-19 pneumonia  S/p trach  Morbid obesity, possible OSA - check CXR may need to switch from 6-0 to 6-0 prox  XLT to prevent night-time desaturations - progressive mobility - push diuresis as tolerated by renal function as is being done - continue hypertonic saline and CPT - hopefully a candidate for CIR  [name] MD [facility] 01/02/2020 8:31 AM Personal pager: [phone] If unanswered, please page CCM On-call: [phone]

## 2020-01-02 NOTE — Progress Notes (Signed)
  Speech Language Pathology Treatment: [name] Speaking valve  Patient Details Name: Clifford Nichols MRN: [id number] DOB: Dec 07, 1980 Today's Date: 01/02/2020 Time: 1511-1530 SLP Time Calculation (min) (ACUTE ONLY): 19 min  Assessment / Plan / Recommendation Clinical Impression  Pt's wife and mother at bedside. RT changed trach #6 XLT cuffless and pt able to phonate in short phrases increasing when PMV donned. Pt's exhalatory volume adequate to support conversation with SLP and family with appropriate intensity and quality. No signs of respiratory distress, valve worn for 18 min and removed without evidence of trapped air. His HR 120, RR 20-38 and SpO2 88-93%. Mom and wife observed donn and doff then mom demonstrated accurately. Recommend he wear valve all waking hours.  Plan to perform FEES tomorrow to assess swallow function and discussed with pt/family.   HPI HPI: 39 year old obese man with hypertension, diabetes, admitted 12/02/19 to [facility] with acute respiratory failure, ARDS from COVID-19 pneumonia, initial positive test 5/10. Intubated 5/11, required paralytics, proning, pressors, tracheostomy 5/28; Head CT 5/12 no abnormality. Tongue necrosis due to pt biting down when orally intubated      SLP Plan  Continue with current plan of care       Recommendations         Patient may use Passy-Muir Speech Valve: During all waking hours (remove during sleep) PMSV Supervision: Intermittent         Plan: Continue with current plan of care       GO                [name] 01/02/2020, 3:46 PM

## 2020-01-02 NOTE — Progress Notes (Signed)
Notified by hospitalist regarding cancelled cultures. I see no evidence of infection. Will dc culture, take back over as primary, and trend fever curve, hemodynamics, and WBCs. Discussed with primary.  Myrla Halsted MD PCCM

## 2020-01-02 NOTE — Progress Notes (Signed)
Pt's falling sleep, SpO2 dropped to the low 70's.  Fi02 increased to 60% on ATC.

## 2020-01-02 NOTE — Progress Notes (Signed)
RN and NT were transferring pt. From bed to chair with front wheel walker. Pt. Clifford Nichols got caught on the side of the bed and patient proceeded to sit on the floor. Pt. Wife was called and notified of event. MD aware.  Pt. Stated he was in no pain.

## 2020-01-02 NOTE — Progress Notes (Addendum)
Pt's SATS dropped to the low 80's.  Pt suctioned and Fi02 increased to, Sp02 increased to upper 90's.

## 2020-01-03 DIAGNOSIS — I10 Essential (primary) hypertension: Secondary | ICD-10-CM

## 2020-01-03 DIAGNOSIS — R739 Hyperglycemia, unspecified: Secondary | ICD-10-CM

## 2020-01-03 LAB — CBC
HCT: 31.5 % — ABNORMAL LOW (ref 39.0–52.0)
Hemoglobin: 9.3 g/dL — ABNORMAL LOW (ref 13.0–17.0)
MCH: 30.1 pg (ref 26.0–34.0)
MCHC: 29.5 g/dL — ABNORMAL LOW (ref 30.0–36.0)
MCV: 101.9 fL — ABNORMAL HIGH (ref 80.0–100.0)
Platelets: 147 10*3/uL — ABNORMAL LOW (ref 150–400)
RBC: 3.09 MIL/uL — ABNORMAL LOW (ref 4.22–5.81)
RDW: 15.7 % — ABNORMAL HIGH (ref 11.5–15.5)
WBC: 10.3 10*3/uL (ref 4.0–10.5)
nRBC: 0 % (ref 0.0–0.2)

## 2020-01-03 LAB — BASIC METABOLIC PANEL
Anion gap: 21 — ABNORMAL HIGH (ref 5–15)
BUN: 51 mg/dL — ABNORMAL HIGH (ref 6–20)
CO2: 27 mmol/L (ref 22–32)
Calcium: 8.7 mg/dL — ABNORMAL LOW (ref 8.9–10.3)
Chloride: 90 mmol/L — ABNORMAL LOW (ref 98–111)
Creatinine, Ser: 1.4 mg/dL — ABNORMAL HIGH (ref 0.61–1.24)
GFR calc Af Amer: >60 mL/min (ref >60)
GFR calc non Af Amer: >60 mL/min (ref >60)
Glucose, Bld: 139 mg/dL — ABNORMAL HIGH (ref 70–99)
Potassium: 4.2 mmol/L (ref 3.5–5.1)
Sodium: 138 mmol/L (ref 135–145)

## 2020-01-03 LAB — GLUCOSE, CAPILLARY
Glucose-Capillary: 115 mg/dL — ABNORMAL HIGH (ref 70–99)
Glucose-Capillary: 131 mg/dL — ABNORMAL HIGH (ref 70–99)
Glucose-Capillary: 138 mg/dL — ABNORMAL HIGH (ref 70–99)
Glucose-Capillary: 148 mg/dL — ABNORMAL HIGH (ref 70–99)
Glucose-Capillary: 148 mg/dL — ABNORMAL HIGH (ref 70–99)
Glucose-Capillary: 189 mg/dL — ABNORMAL HIGH (ref 70–99)

## 2020-01-03 MED ORDER — OXYCODONE HCL 5 MG PO TABS
10.0000 mg | ORAL_TABLET | ORAL | Status: DC
  Administered 2020-01-03 – 2020-01-11 (×47): 10 mg
  Filled 2020-01-03 (×47): qty 2

## 2020-01-03 MED ORDER — GUAIFENESIN 100 MG/5ML PO SOLN
5.0000 mL | ORAL | Status: DC | PRN
  Administered 2020-01-03 (×2): 100 mg via ORAL
  Filled 2020-01-03 (×2): qty 5

## 2020-01-03 MED ORDER — MELATONIN 3 MG PO TABS
3.0000 mg | ORAL_TABLET | Freq: Every day | ORAL | Status: DC
  Administered 2020-01-03 – 2020-01-12 (×10): 3 mg via ORAL
  Filled 2020-01-03 (×11): qty 1

## 2020-01-03 MED ORDER — MIDAZOLAM HCL 2 MG/2ML IJ SOLN
1.0000 mg | Freq: Once | INTRAMUSCULAR | Status: AC
  Administered 2020-01-03: 1 mg via INTRAVENOUS
  Filled 2020-01-03: qty 2

## 2020-01-03 MED ORDER — FENTANYL CITRATE (PF) 100 MCG/2ML IJ SOLN
50.0000 ug | Freq: Once | INTRAMUSCULAR | Status: AC
  Administered 2020-01-03: 50 ug via INTRAVENOUS
  Filled 2020-01-03: qty 2

## 2020-01-03 NOTE — Progress Notes (Signed)
Patient has had multiple episodes of decreased SpO2 stats throughout the night. O2 stats have dropped to the low 70s while patient is asleep. O2 stats increase to the 90s once patient is woken up. Respiratory to bedside multiple times throughout the night. Fi02 has increased from 35% to 60%. Will continue to monitor.

## 2020-01-03 NOTE — Progress Notes (Signed)
Assisted tele visit to patient with family member.  Avaneesh Pepitone M, RN  

## 2020-01-03 NOTE — Progress Notes (Signed)
  Speech Language Pathology  Patient Details Name: CAROLD EISNER MRN: [id number] DOB: [birth year] Today's Date: 01/03/2020 Time:  -     Pt will have FEES swallow assessment in his room today at 10:00                                 [name] 01/03/2020, 8:05 AM   [name] [name].Ed Nurse, children's [phone] Office [phone]

## 2020-01-03 NOTE — Progress Notes (Signed)
Assisted tele visit to patient with family member.  Jesse Nosbisch M, RN  

## 2020-01-03 NOTE — Progress Notes (Addendum)
Attending addendum 01/03/2020   Seen in f/u for COVID-induced respiratory failure. Continues to desaturate at night. Has back and leg pain. To have swallow screen this am.  On exam, trach in place with PMV, minimal secretions Heart sounds regular Ext warm Abd soft, +BS Foley in place  A -Improving respiratory failure post-COVID 19 and tracheostomy Class 3 obesity - Multiple HCAPs, no current evidence of infection -Muscular deconditioning -Diabetes mellitus with hyperglycemia: improved -Stage 3 pressure ulcer - Nocturnal desaturations suspect secondary to less diaphragm tone at night as well as lingering bronchial damage from COVID (+/- obesity associated dynamic airway collapse), will need PAP of some sort at night to prevent  P - Continue aggressive PT/OT - Switch back to cuffed trach with CPAP vs. BIPAP at night - Remove foley, place condom catheter, bladder scans PRN - Continue lasix as tolerated by renal function, f/u am bmp - I think he would be a great CIR candidate - Go back up on oxycodone given reports of pain - Add melatonin for sleep - Klonipin: keep current level today, eventually taper  [name] MD PCCM    NAME:  Clifford Nichols, MRN:  [id number], DOB:  1981-03-29, LOS: 59 [facility] ADMISSION DATE:  12/04/2019, [facility] Admission DATE:  12/04/19 REFERRING MD:  Dr Mortimer Fries, CCM, CHIEF COMPLAINT:  Acute respiratory failure   Brief History   39 year old obese man with hypertension, diabetes admitted with acute respiratory failure, ARDS from COVID-19 pneumonia, initial positive test 5/10  Past Medical History    has a past medical history of Abscess of upper back excluding scapular region, Diabetes mellitus without complication ([place]), GERD (gastroesophageal reflux disease), Hypertension, and Sebaceous cyst.  Significant Hospital Events   5/10 admitted [facility]  5/11 failed HHFNC, BiPAP.  Required intubation 5/12 transferred to [facility], evaluated by opthal for proptosis- no  intervention required 5/17 Started paralytics, ECMO team consulted Not a candidate 5/19 Started proning 5/23 Off pressors 5/26 Proning stopped, continue paralytics 5/27 New tongue wound 2/2 bite block in place, weaned off ketamine. Daily fevers 5/28 Trach, stop paralytics 6/1 Out of airborne isolation, family visiting, on PSV weans 6/6 Tolerated ATC yesterday for 12 hrs 6/7 remained on ATC 6/11 remains on ATC  Consults:  Ophthalmology for proptosis 5/12  Procedures:  ETT 5/11 >> 5/28 L femoral CVC 5/11 >> 5/17 R radial art line 5/17 >> 5/28 L IJ CVC 5/17 >> 6/1  Trach 5/28  Significant Diagnostic Tests:  Head CT 5/11 >> no significant intracranial abnormality.  Scattered opacification and air-fluid levels in the frontal, ethmoid, sphenoid and maxillary sinuses of unclear significance.  Head CT 5/12 >> no significant intracranial abnormality  Lower extremity Doppler 5/14 >> negative for DVT  Micro Data:  SARS CoV2 5/10 >> positive MRSA screen 5/12 >> negative Respiratory 5/12 >> negative Blood 5/17 >> Staph epi and Staph hominis Respiratory 5/17 >> Staph epi 5/24 resp cx bal: staph epi 5/24 BAL acid fast: smear negative cx pending 5/24 BAL fungal:pending 5/26 blood: No growth BAL 5/28-Acinetobacter, Proteus, Pseudomonas, Enterococcus Trach asp 6/1- Pseudomonas  Antimicrobials:  Remdesivir 5/11 >> 5/15 Dexamethasone 5/11 >> 5/20 Tocilizumab 5/12 Cefepime 5/17 >> 5/21 Linezolid 5/17 >> 5/23 Zosyn 5/21 >>  5/29 Vanco 5/28 >> 6/1  Interim history/subjective:  Despite tracheostomy he continues to have desaturations overnight. As low as the low 70s. This results in his Fio2 being increased. He was on 35% yesterday afternoon and now is back to up 60%.   Objective   Blood pressure  132/80, pulse (!) 106, temperature 98.2 F (36.8 C), temperature source Oral, resp. rate (!) 26, height _0  (1.702 m), weight (!) 161.2 kg, SpO2 94 %.    FiO2 (%):  [35 %-60 %] 60 %    Intake/Output Summary (Last 24 hours) at 01/03/2020 0802 Last data filed at 01/03/2020 0606 Gross per 24 hour  Intake 905 ml  Output 1800 ml  Net -895 ml   Filed Weights   12/31/19 0421 01/01/20 0411 01/03/20 0416  Weight: (!) 165.8 kg (!) 160.9 kg (!) 161.2 kg    Examination: General:  Morbidly obese Mcdaid adult male resting comfortably in bed.  HEENT: trach in place, minimal secretions.  Neuro: Awake, alert, phonates around trach.  CV: Tachycardic, regular, ext warm PULM:  Non labored, diminished GI: obese, soft, NT  Extremities: warm/dry, no LE edema  Skin: no rashes    Resolved Hospital Problem list    Left proptosis, noted with coughing early 5/12, resolved.  Apparently per wife he has episodic intermittent proptosis at home as well. Head CT 5/12 reassuring Ophthalmology did evaluate, recommended that he will need outpatient evaluation but no intervention at this time beyond keeping the eyes moist.  Septic shock, present on admission. Tongue necrosis secondary to biting down Hypertriglyceridemia Acinetobacter HAP Acute toxic metabolic encephalopathy  Assessment & Plan:   Severe ARDS due to COVID-19 pneumonia  S/p trach  Morbid obesity, possible OSA - Still having nocturnal desaturations despite change to #6 proximal XLT trach yesterday.  - Will likely need nocturnal positive pressure. Change trach out for cuffed.  - progressive mobility - push diuresis as tolerated by renal function as is being done - continue hypertonic saline and CPT - hopefully a candidate for CIR  AKI: improving - BMP pending - Continue lasix 78m daily assuming renal function continues to tolerate.  - DC foley  DM2 -Levemir  -SSI  Dispo: - Pending LTACH palcement -[facility].    PGeorgann Housekeeper AGACNP-BC LRichland See Amion for personal pager PCCM on call pager [phone] 01/03/2020 8:06  AM

## 2020-01-03 NOTE — Progress Notes (Signed)
Physical Therapy Treatment Patient Details Name: Clifford Nichols MRN: [id number] DOB: 09-21-1980 Today's Date: 01/03/2020    History of Present Illness 39 year old obese man with hypertension, diabetes, admitted 12/02/19 to [facility] with acute respiratory failure, ARDS from COVID-19 pneumonia, initial positive test 5/10. Intubated 5/11, required paralytics, proning, pressors, tracheostomy 5/28; Head CT 5/12 no abnormality. Seen by opthalmology due to left eye proptosis (no intervention recommended). Tongue necrosis due to pt biting down when orally intubated    PT Comments    Pt progressing well toward goals.  Gait pattern/stability/stamina improving with each session.  Continue to work on UE's and trunk exercise in addition to LE strengthening   Follow Up Recommendations  CIR     Equipment Recommendations  Other (comment)    Recommendations for Other Services       Precautions / Restrictions Precautions Precautions: Fall Precaution Comments: watch RR, HR, sats Restrictions Weight Bearing Restrictions: No    Mobility  Bed Mobility Overal bed mobility: Needs Assistance Bed Mobility: Sit to Supine Rolling: Min guard         General bed mobility comments: pt now can use momentum to assist coming up via right elbow  Transfers Overall transfer level: Needs assistance Equipment used: Rolling walker (2 wheeled) Transfers: Sit to/from Stand Sit to Stand: Min assist;+2 safety/equipment         General transfer comment: min for lower surfaces with arm rests or bed height  Ambulation/Gait Ambulation/Gait assistance: Mod assist;+2 safety/equipment;Min assist Gait Distance (Feet): 24 Feet (x2) Assistive device: Rolling walker (2 wheeled) Gait Pattern/deviations: Step-through pattern Gait velocity: improved speed Gait velocity interpretation: 1.31 - 2.62 ft/sec, indicative of limited community ambulator General Gait Details: pt still having trouble coordinating w/shifts and  step length/width, but pt able to clear his feet without toe drag, was able to better control foot placement and general cadence was improved overall.   Stairs             Wheelchair Mobility    Modified Rankin (Stroke Patients Only)       Balance Overall balance assessment: Needs assistance Sitting-balance support: Bilateral upper extremity supported;No upper extremity supported;Feet supported Sitting balance-Leahy Scale: Fair     Standing balance support: Bilateral upper extremity supported;During functional activity Standing balance-Leahy Scale: Poor (to fair) Standing balance comment: relies on bil UE on RW                            Cognition Arousal/Alertness: Awake/alert Behavior During Therapy: WFL for tasks assessed/performed Overall Cognitive Status: Within Functional Limits for tasks assessed                                        Exercises Other Exercises Other Exercises: bicep/tricep presses, resisted x 10 reps bil    General Comments General comments (skin integrity, edema, etc.): sats on 50% FiO2 was 99-100%, HT in the 110's      Pertinent Vitals/Pain Pain Assessment: No/denies pain Faces Pain Scale: No hurt Pain Intervention(s): Monitored during session    Home Living                      Prior Function            PT Goals (current goals can now be found in the care plan section) Acute Rehab PT Goals Patient Stated Goal:  get stronger, get to go to rehab PT Goal Formulation: With patient Time For Goal Achievement: 01/06/20 Potential to Achieve Goals: Good Progress towards PT goals: Progressing toward goals    Frequency    Min 4X/week      PT Plan Current plan remains appropriate    Co-evaluation              AM-PAC PT "6 Clicks" Mobility   Outcome Measure  Help needed turning from your back to your side while in a flat bed without using bedrails?: A Little Help needed moving from  lying on your back to sitting on the side of a flat bed without using bedrails?: A Little Help needed moving to and from a bed to a chair (including a wheelchair)?: A Lot Help needed standing up from a chair using your arms (e.g., wheelchair or bedside chair)?: A Little Help needed to walk in hospital room?: A Lot Help needed climbing 3-5 steps with a railing? : Total 6 Click Score: 14    End of Session Equipment Utilized During Treatment: Oxygen Activity Tolerance: Patient tolerated treatment well Patient left: in chair;with call bell/phone within reach;with family/visitor present Nurse Communication: Mobility status PT Visit Diagnosis: Unsteadiness on feet (R26.81);Other abnormalities of gait and mobility (R26.89);Muscle weakness (generalized) (M62.81);Difficulty in walking, not elsewhere classified (R26.2)     Time: 2841-3244 PT Time Calculation (min) (ACUTE ONLY): 28 min  Charges:  $Gait Training: 8-22 mins $Therapeutic Exercise: 8-22 mins                     01/03/2020  [name], PT Acute Rehabilitation Services [phone]  (pager) [phone]  (office)   [name] Briana Newman 01/03/2020, 5:10 PM

## 2020-01-03 NOTE — Procedures (Signed)
Tracheostomy Change Note  Patient Details:   Name: Clifford Nichols DOB: 25-Aug-1980 MRN: [id number]    Airway Documentation:     Evaluation  O2 sats: stable throughout Complications: No apparent complications Patient did tolerate procedure well. Bilateral Breath Sounds: Clear, Diminished   Patients trach changed per MD order from #6 XLT uncuffed to #6 XLT cuffed. Good color change on CO2. Vitals stable throughout the procedure. RT will continue to monitor.    Harmon Dun Eladio Dentremont 01/03/2020, 11:53 AM

## 2020-01-03 NOTE — Progress Notes (Signed)
Assisted tele visit to patient with wife.  Rosalyn Archambault M, RN  

## 2020-01-03 NOTE — Progress Notes (Signed)
Assisted tele visit to patient with wife.  Evadna Donaghy M, RN  

## 2020-01-03 NOTE — Progress Notes (Signed)
  Speech Language Pathology Treatment: Dysphagia  Patient Details Name: Clifford Nichols MRN: [id number] DOB: 29-Aug-1980 Today's Date: 01/03/2020 Time: 0998-3382 SLP Time Calculation (min) (ACUTE ONLY): 13 min  Assessment / Plan / Recommendation Clinical Impression  Following FEES assessment SLP introduced exercises to facilitate tongue base retraction, pharyngeal contraction and laryngeal closure. He performed effortful swallow, Masako technique, falsetto and laryngeal closure with 70% accuracy requiring moderate verbal and visual assist. ST to continue dysphagia tx moving toward po initiation.    HPI HPI: 39 year old obese man with hypertension, diabetes, admitted 12/02/19 to [facility] with acute respiratory failure, ARDS from COVID-19 pneumonia, initial positive test 5/10. Intubated 5/11, required paralytics, proning, pressors, tracheostomy 5/28; Head CT 5/12 no abnormality. Tongue necrosis due to pt biting down when orally intubated      SLP Plan  Continue with current plan of care       Recommendations                   Oral Care Recommendations: Oral care QID Follow up Recommendations: LTACH SLP Visit Diagnosis: Dysphagia, pharyngeal phase (R13.13) Plan: Continue with current plan of care                       [name] 01/03/2020, 3:53 PM   [name] Deeanne Deininger M.Ed Nurse, children's [phone] Office [phone]

## 2020-01-03 NOTE — Procedures (Signed)
Objective Swallowing Evaluation: Type of Study: FEES-Fiberoptic Endoscopic Evaluation of Swallow   Patient Details  Name: Clifford Nichols MRN: [id number] Date of Birth: [birth year]  Today's Date: 01/03/2020 Time: SLP Start Time (ACUTE ONLY): 1015 -SLP Stop Time (ACUTE ONLY): 1050  SLP Time Calculation (min) (ACUTE ONLY): 35 min   Past Medical History:  Past Medical History:  Diagnosis Date  . Abscess of upper back excluding scapular region   . Diabetes mellitus without complication ([place])   . GERD (gastroesophageal reflux disease)    OCC-NO MEDS  . Hypertension   . Sebaceous cyst    Past Surgical History:  Past Surgical History:  Procedure Laterality Date  . IRRIGATION AND DEBRIDEMENT SEBACEOUS CYST Right 07/11/2018   Procedure: EXCISION  SEBACEOUS CYST POSTERIOR SHOULDER AND MID-BACK;  Surgeon: [name], MD;  Location: [facility] ORS;  Service: General;  Laterality: Right;  . NO PAST SURGERIES     HPI: 39 year old obese man with hypertension, diabetes, admitted 12/02/19 to [facility] with acute respiratory failure, ARDS from COVID-19 pneumonia, initial positive test 5/10. Intubated 5/11, required paralytics, proning, pressors, tracheostomy 5/28; Head CT 5/12 no abnormality. Tongue necrosis due to pt biting down when orally intubated   No data recorded   Assessment / Plan / Recommendation  CHL IP CLINICAL IMPRESSIONS 01/03/2020  Clinical Impression PMV donned prior to FEES which revealed decreased pharyngeal wall movement during non swallow tasks. He did not have significant erythema and no significant edema. No laryngeal/pharyngeal secretions visible. Timing of laryngeal swallow initiaton was adequate however duration of apneic phase appeared shorter and suspect reduced tongue base and contraction leading to frank penetration above vocal cords. He was unable to clear vestibule with cues to cough and throat clear multiple times. Unable to determine if puree significantly added to  penetration. He continues to have periods of lethargy, was sleepier today and overall deconditioning thus recommend continue NPO. Continue ice chips. ST will continue with pharyngeal strengthening exercises for advancement to po's       SLP Visit Diagnosis Dysphagia, pharyngeal phase (R13.13)  Attention and concentration deficit following --  Frontal lobe and executive function deficit following --  Impact on safety and function Moderate aspiration risk;Severe aspiration risk      CHL IP TREATMENT RECOMMENDATION 01/03/2020  Treatment Recommendations Therapy as outlined in treatment plan below     Prognosis 01/03/2020  Prognosis for Safe Diet Advancement Good  Barriers to Reach Goals --  Barriers/Prognosis Comment --    CHL IP DIET RECOMMENDATION 01/03/2020  SLP Diet Recommendations NPO;Other (Comment)  Liquid Administration via --  Medication Administration Via alternative means  Compensations --  Postural Changes --      CHL IP OTHER RECOMMENDATIONS 01/03/2020  Recommended Consults --  Oral Care Recommendations Oral care QID  Other Recommendations --      CHL IP FOLLOW UP RECOMMENDATIONS 01/03/2020  Follow up Recommendations LTACH      CHL IP FREQUENCY AND DURATION 01/03/2020  Speech Therapy Frequency (ACUTE ONLY) min 2x/week  Treatment Duration 2 weeks           CHL IP ORAL PHASE 01/03/2020  Oral Phase WFL  Oral - Pudding Teaspoon --  Oral - Pudding Cup --  Oral - Honey Teaspoon --  Oral - Honey Cup --  Oral - Nectar Teaspoon --  Oral - Nectar Cup --  Oral - Nectar Straw --  Oral - Thin Teaspoon --  Oral - Thin Cup --  Oral - Thin Straw --  Oral - Puree --  Oral - Mech Soft --  Oral - Regular --  Oral - Multi-Consistency --  Oral - Pill --  Oral Phase - Comment --    CHL IP PHARYNGEAL PHASE 01/03/2020  Pharyngeal Phase Impaired  Pharyngeal- Pudding Teaspoon --  Pharyngeal --  Pharyngeal- Pudding Cup --  Pharyngeal --  Pharyngeal- Honey Teaspoon  Penetration/Aspiration during swallow;Reduced tongue base retraction;Reduced airway/laryngeal closure  Pharyngeal Material enters airway, remains ABOVE vocal cords and not ejected out  Pharyngeal- Honey Cup Penetration/Aspiration during swallow;Pharyngeal residue - pyriform;Reduced tongue base retraction;Reduced airway/laryngeal closure  Pharyngeal Material enters airway, remains ABOVE vocal cords and not ejected out  Pharyngeal- Nectar Teaspoon Penetration/Aspiration during swallow;Reduced airway/laryngeal closure  Pharyngeal Material enters airway, remains ABOVE vocal cords and not ejected out  Pharyngeal- Nectar Cup Penetration/Aspiration during swallow  Pharyngeal Material enters airway, remains ABOVE vocal cords and not ejected out  Pharyngeal- Nectar Straw --  Pharyngeal --  Pharyngeal- Thin Teaspoon --  Pharyngeal --  Pharyngeal- Thin Cup --  Pharyngeal --  Pharyngeal- Thin Straw --  Pharyngeal --  Pharyngeal- Puree Pharyngeal residue - pyriform  Pharyngeal --  Pharyngeal- Mechanical Soft --  Pharyngeal --  Pharyngeal- Regular --  Pharyngeal --  Pharyngeal- Multi-consistency --  Pharyngeal --  Pharyngeal- Pill --  Pharyngeal --  Pharyngeal Comment --     No flowsheet data found.   [name] 01/03/2020, 3:45 PM  Clifford Nichols, children's [phone] Office [phone]

## 2020-01-03 NOTE — Care Management (Signed)
Confirmed w CSW that dispo goal is [facility] not [facility]. Confirmed w Tresa Endo at [facility] that they are following and awaiting patient to wean to 35% to be medically appropriate.

## 2020-01-03 NOTE — Progress Notes (Signed)
Inpatient Rehabilitation-Admissions Coordinator   Continuing to follow for possible CIR admit. Note pt is currently on 60% FiO2. Pt will need to be weaned to 35% FiO2 before medically appropriate for IP rehab. TOC aware.   Will follow up Monday.   Cheri Rous, OTR/L  Rehab Admissions Coordinator  [phone] 01/03/2020 12:22 PM

## 2020-01-04 DIAGNOSIS — Z794 Long term (current) use of insulin: Secondary | ICD-10-CM

## 2020-01-04 DIAGNOSIS — E0800 Diabetes mellitus due to underlying condition with hyperosmolarity without nonketotic hyperglycemic-hyperosmolar coma (NKHHC): Secondary | ICD-10-CM

## 2020-01-04 LAB — BASIC METABOLIC PANEL
Anion gap: 13 (ref 5–15)
BUN: 52 mg/dL — ABNORMAL HIGH (ref 6–20)
CO2: 36 mmol/L — ABNORMAL HIGH (ref 22–32)
Calcium: 9.1 mg/dL (ref 8.9–10.3)
Chloride: 88 mmol/L — ABNORMAL LOW (ref 98–111)
Creatinine, Ser: 1.4 mg/dL — ABNORMAL HIGH (ref 0.61–1.24)
GFR calc Af Amer: >60 mL/min (ref >60)
GFR calc non Af Amer: >60 mL/min (ref >60)
Glucose, Bld: 156 mg/dL — ABNORMAL HIGH (ref 70–99)
Potassium: 3.1 mmol/L — ABNORMAL LOW (ref 3.5–5.1)
Sodium: 137 mmol/L (ref 135–145)

## 2020-01-04 LAB — GLUCOSE, CAPILLARY
Glucose-Capillary: 107 mg/dL — ABNORMAL HIGH (ref 70–99)
Glucose-Capillary: 134 mg/dL — ABNORMAL HIGH (ref 70–99)
Glucose-Capillary: 148 mg/dL — ABNORMAL HIGH (ref 70–99)
Glucose-Capillary: 151 mg/dL — ABNORMAL HIGH (ref 70–99)
Glucose-Capillary: 160 mg/dL — ABNORMAL HIGH (ref 70–99)
Glucose-Capillary: 164 mg/dL — ABNORMAL HIGH (ref 70–99)

## 2020-01-04 LAB — MAGNESIUM: Magnesium: 2.1 mg/dL (ref 1.7–2.4)

## 2020-01-04 MED ORDER — POTASSIUM CHLORIDE CRYS ER 20 MEQ PO TBCR
40.0000 meq | EXTENDED_RELEASE_TABLET | Freq: Once | ORAL | Status: AC
  Administered 2020-01-04: 40 meq via ORAL
  Filled 2020-01-04: qty 2

## 2020-01-04 NOTE — Progress Notes (Signed)
Assisted tele visit to patient with family member.  Kahla Risdon M, RN  

## 2020-01-04 NOTE — Progress Notes (Signed)
Patient very sleepy at this time. Would like to continue to wear BiPAP. Will place on trach collar once more awake.

## 2020-01-04 NOTE — Progress Notes (Signed)
eLink Physician-Brief Progress Note Patient Name: Clifford Nichols DOB: 03-Jul-1981 MRN: [id number]   Date of Service  01/04/2020  HPI/Events of Note  KCL 3.1, Pt is receiving scheduled K+ supplementation.  eICU Interventions  KCL 40 meq po x 1         Zamaya Rapaport U [place] Gustafson 01/04/2020, 6:29 AM

## 2020-01-04 NOTE — Progress Notes (Signed)
NAME:  Clifford Nichols, MRN:  [id number], DOB:  1981-01-17, LOS: 25 [facility] ADMISSION DATE:  12/04/2019, [facility] Admission DATE:  12/04/19 REFERRING MD:  Dr Mortimer Fries, CCM, CHIEF COMPLAINT:  Acute respiratory failure   Brief History   39 year old obese man with hypertension, diabetes admitted with acute respiratory failure, ARDS from COVID-19 pneumonia, initial positive test 5/10  Past Medical History    has a past medical history of Abscess of upper back excluding scapular region, Diabetes mellitus without complication ([place]), GERD (gastroesophageal reflux disease), Hypertension, and Sebaceous cyst.  Significant Hospital Events   5/10 admitted [facility]  5/11 failed HHFNC, BiPAP.  Required intubation 5/12 transferred to [facility], evaluated by opthal for proptosis- no intervention required 5/17 Started paralytics, ECMO team consulted Not a candidate 5/19 Started proning 5/23 Off pressors 5/26 Proning stopped, continue paralytics 5/27 New tongue wound 2/2 bite block in place, weaned off ketamine. Daily fevers 5/28 Trach, stop paralytics 6/1 Out of airborne isolation, family visiting, on PSV weans 6/6 Tolerated ATC yesterday for 12 hrs 6/7 remained on ATC 6/11 remains on ATC  Consults:  Ophthalmology for proptosis 5/12  Procedures:  ETT 5/11 >> 5/28 L femoral CVC 5/11 >> 5/17 R radial art line 5/17 >> 5/28 L IJ CVC 5/17 >> 6/1  Trach 5/28  Significant Diagnostic Tests:  Head CT 5/11 >> no significant intracranial abnormality.  Scattered opacification and air-fluid levels in the frontal, ethmoid, sphenoid and maxillary sinuses of unclear significance.  Head CT 5/12 >> no significant intracranial abnormality  Lower extremity Doppler 5/14 >> negative for DVT  Micro Data:  SARS CoV2 5/10 >> positive MRSA screen 5/12 >> negative Respiratory 5/12 >> negative Blood 5/17 >> Staph epi and Staph hominis Respiratory 5/17 >> Staph epi 5/24 resp cx bal: staph epi 5/24 BAL acid fast: smear negative cx  pending 5/24 BAL fungal:pending 5/26 blood: No growth BAL 5/28-Acinetobacter, Proteus, Pseudomonas, Enterococcus Trach asp 6/1- Pseudomonas  Antimicrobials:  Remdesivir 5/11 >> 5/15 Dexamethasone 5/11 >> 5/20 Tocilizumab 5/12 Cefepime 5/17 >> 5/21 Linezolid 5/17 >> 5/23 Zosyn 5/21 >>  5/29 Vanco 5/28 >> 6/1  Interim history/subjective:  Desaturated overnight On BiPAP this morning Will try ATM today  Despite tracheostomy he continues to have desaturations overnight. As low as the low 70s.   Objective   Blood pressure 121/71, pulse (!) 102, temperature 98.2 F (36.8 C), temperature source Oral, resp. rate 17, height _0  (1.702 m), weight (!) 159.5 kg, SpO2 94 %.    Vent Mode: BIPAP FiO2 (%):  [40 %-60 %] 40 % Set Rate:  [8 bmp] 8 bmp PEEP:  [5 cmH20] 5 cmH20   Intake/Output Summary (Last 24 hours) at 01/04/2020 1009 Last data filed at 01/04/2020 0100 Gross per 24 hour  Intake 720 ml  Output 1250 ml  Net -530 ml   Filed Weights   01/01/20 0411 01/03/20 0416 01/04/20 0500  Weight: (!) 160.9 kg (!) 161.2 kg (!) 159.5 kg    Examination: General:  Morbidly obese [place] adult male resting comfortably in bed.  HEENT: Trach in place, on vent Neuro: Awake, alert, phonates around trach.  CV: Tachycardic, regular, ext warm PULM:  Non labored, diminished   Resolved Hospital Problem list    Left proptosis, noted with coughing early 5/12, resolved.  Apparently per wife he has episodic intermittent proptosis at home as well. Head CT 5/12 reassuring Ophthalmology did evaluate, recommended that he will need outpatient evaluation but no intervention at this time beyond keeping the eyes moist.  Septic shock, present on admission. Tongue necrosis secondary to biting down Hypertriglyceridemia Acinetobacter HAP Acute toxic metabolic encephalopathy  Chest x-ray reviewed by myself last 01/02/2020-slight improvement in haziness at the bases Labs on 612 and 611 reviewed-creatinine  of 1.40  Assessment & Plan:   Severe ARDS due to COVID-19 pneumonia  S/p trach  Morbid obesity, possible OSA Multiple pneumonias in the past Deconditioning - Still having nocturnal desaturations despite change to #6 proximal XLT trach yesterday.  - Will likely need nocturnal positive pressure. Change trach out for cuffed.  - On BiPAP - progressive mobility - push diuresis as tolerated by renal function as is being done - continue hypertonic saline and CPT - hopefully a candidate for CIR  AKI: improving - BMP pending - Continue lasix 19m daily assuming renal function continues to tolerate.   DM2 -Levemir  -SSI  Stage III pressure ulcer  Patient may have a component of obesity hypoventilation  Dispo: - Pending LTACH palcement -[facility]   The patient is critically ill with multiple organ systems failure and requires high complexity decision making for assessment and support, frequent evaluation and titration of therapies, application of advanced monitoring technologies and extensive interpretation of multiple databases. Critical Care Time devoted to patient care services described in this note independent of APP/resident time (if applicable)  is 30 minutes.   ASherrilyn RistMD LScotchtownPulmonary Critical Care Personal pager: [phone] unanswered, please page CCM On-call: #[phone]

## 2020-01-05 ENCOUNTER — Inpatient Hospital Stay (HOSPITAL_COMMUNITY): Payer: No Typology Code available for payment source

## 2020-01-05 LAB — BASIC METABOLIC PANEL
Anion gap: 16 — ABNORMAL HIGH (ref 5–15)
BUN: 47 mg/dL — ABNORMAL HIGH (ref 6–20)
CO2: 34 mmol/L — ABNORMAL HIGH (ref 22–32)
Calcium: 9.2 mg/dL (ref 8.9–10.3)
Chloride: 87 mmol/L — ABNORMAL LOW (ref 98–111)
Creatinine, Ser: 1.19 mg/dL (ref 0.61–1.24)
GFR calc Af Amer: >60 mL/min (ref >60)
GFR calc non Af Amer: >60 mL/min (ref >60)
Glucose, Bld: 145 mg/dL — ABNORMAL HIGH (ref 70–99)
Potassium: 3.5 mmol/L (ref 3.5–5.1)
Sodium: 137 mmol/L (ref 135–145)

## 2020-01-05 LAB — GLUCOSE, CAPILLARY
Glucose-Capillary: 150 mg/dL — ABNORMAL HIGH (ref 70–99)
Glucose-Capillary: 146 mg/dL — ABNORMAL HIGH (ref 70–99)
Glucose-Capillary: 132 mg/dL — ABNORMAL HIGH (ref 70–99)
Glucose-Capillary: 124 mg/dL — ABNORMAL HIGH (ref 70–99)
Glucose-Capillary: 144 mg/dL — ABNORMAL HIGH (ref 70–99)
Glucose-Capillary: 145 mg/dL — ABNORMAL HIGH (ref 70–99)

## 2020-01-05 LAB — MAGNESIUM: Magnesium: 2.2 mg/dL (ref 1.7–2.4)

## 2020-01-05 MED ORDER — ZOLPIDEM TARTRATE 5 MG PO TABS: 5.0000 mg | ORAL_TABLET | Freq: Every evening | ORAL | Status: DC | PRN

## 2020-01-05 NOTE — Progress Notes (Addendum)
NAME:  Clifford Nichols, MRN:  [id number], DOB:  12-23-1980, LOS: 19 [facility] ADMISSION DATE:  12/04/2019, [facility] Admission DATE:  12/04/19 REFERRING MD:  Dr Mortimer Fries, CCM, CHIEF COMPLAINT:  Acute respiratory failure   Brief History   39 year old obese man with hypertension, diabetes admitted with acute respiratory failure, ARDS from COVID-19 pneumonia, initial positive test 5/10  Past Medical History    has a past medical history of Abscess of upper back excluding scapular region, Diabetes mellitus without complication ([place]), GERD (gastroesophageal reflux disease), Hypertension, and Sebaceous cyst.  Significant Hospital Events   5/10 admitted [facility]  5/11 failed HHFNC, BiPAP.  Required intubation 5/12 transferred to [facility], evaluated by opthal for proptosis- no intervention required 5/17 Started paralytics, ECMO team consulted Not a candidate 5/19 Started proning 5/23 Off pressors 5/26 Proning stopped, continue paralytics 5/27 New tongue wound 2/2 bite block in place, weaned off ketamine. Daily fevers 5/28 Trach, stop paralytics 6/1 Out of airborne isolation, family visiting, on PSV weans 6/6 Tolerated ATC yesterday for 12 hrs 6/7 remained on ATC 6/11 remains on ATC  Consults:  Ophthalmology for proptosis 5/12  Procedures:  ETT 5/11 >> 5/28 L femoral CVC 5/11 >> 5/17 R radial art line 5/17 >> 5/28 L IJ CVC 5/17 >> 6/1  Trach 5/28  Significant Diagnostic Tests:  Head CT 5/11 >> no significant intracranial abnormality.  Scattered opacification and air-fluid levels in the frontal, ethmoid, sphenoid and maxillary sinuses of unclear significance.  Head CT 5/12 >> no significant intracranial abnormality  Lower extremity Doppler 5/14 >> negative for DVT  Micro Data:  SARS CoV2 5/10 >> positive MRSA screen 5/12 >> negative Respiratory 5/12 >> negative Blood 5/17 >> Staph epi and Staph hominis Respiratory 5/17 >> Staph epi 5/24 resp cx bal: staph epi 5/24 BAL acid fast: smear negative cx  pending 5/24 BAL fungal:pending 5/26 blood: No growth BAL 5/28-Acinetobacter, Proteus, Pseudomonas, Enterococcus Trach asp 6/1- Pseudomonas  Antimicrobials:  Remdesivir 5/11 >> 5/15 Dexamethasone 5/11 >> 5/20 Tocilizumab 5/12 Cefepime 5/17 >> 5/21 Linezolid 5/17 >> 5/23 Zosyn 5/21 >>  5/29 Vanco 5/28 >> 6/1  Interim history/subjective:  Tolerated ATM most of the day yesterday Did not sleep well through the night Feels well otherwise, denies significant pain or discomfort  Objective   Blood pressure 103/70, pulse (!) 101, temperature 98.2 F (36.8 C), temperature source Oral, resp. rate 18, height _0  (1.702 m), weight (!) 159.5 kg, SpO2 96 %.    Vent Mode: CPAP;PSV FiO2 (%):  [40 %] 40 % PEEP:  [5 cmH20] 5 cmH20 Pressure Support:  [20 cmH20] 20 cmH20   Intake/Output Summary (Last 24 hours) at 01/05/2020 0850 Last data filed at 01/05/2020 0800 Gross per 24 hour  Intake 1265 ml  Output 2475 ml  Net -1210 ml   Filed Weights   01/01/20 0411 01/03/20 0416 01/04/20 0500  Weight: (!) 160.9 kg (!) 161.2 kg (!) 159.5 kg    Examination: General:  Morbidly obese [place] adult male resting comfortably in bed.  HEENT: Trach in place, on ATM Neuro: Awake, alert, phonates around trach.  CV: Tachycardic, regular, ext warm PULM:  Non labored, decreased air movement bilaterally, no added sounds Abdomen: Nontender   Resolved Hospital Problem list    Left proptosis, noted with coughing early 5/12, resolved.  Apparently per wife he has episodic intermittent proptosis at home as well. Head CT 5/12 reassuring Ophthalmology did evaluate, recommended that he will need outpatient evaluation but no intervention at this time beyond keeping  the eyes moist.  Septic shock, present on admission. Tongue necrosis secondary to biting down Hypertriglyceridemia Acinetobacter HAP Acute toxic metabolic encephalopathy  Chest x-ray reviewed by myself last 01/02/2020-slight improvement in haziness  at the bases Labs on 612 and 611 reviewed-creatinine of 1.40  Assessment & Plan:   Severe ARDS due to COVID-19 pneumonia  S/p trach  Morbid obesity, possible OSA Multiple pneumonias in the past Deconditioning - Still having nocturnal desaturations despite change to #6 proximal XLT trach yesterday.  -Trach switched to cuffed trach to allow for positive pressure ventilation at night -BiPAP at night - progressive mobility - push diuresis as tolerated by renal function as is being done - continue hypertonic saline and CPT - hopefully a candidate for CIR  AKI: improving -BUN/creatinine continues to improve - Continue lasix 68m daily assuming renal function continues to tolerate.   DM2 -Levemir  -SSI  Stage III pressure ulcer  Patient may have a component of obesity hypoventilation  May leave off BiPAP tonight to assess whether he continues to desaturate nocturnally  Insomnia -Ambien 5 mg nightly  Dispo: - Pending LTACH placement -[facility]   The patient is critically ill with multiple organ systems failure and requires high complexity decision making for assessment and support, frequent evaluation and titration of therapies, application of advanced monitoring technologies and extensive interpretation of multiple databases. Critical Care Time devoted to patient care services described in this note independent of APP/resident time (if applicable)  is 31 minutes.   ASherrilyn RistMD LIolaPulmonary Critical Care Personal pager: #[phone] unanswered, please page CCM On-call: #[phone]

## 2020-01-06 LAB — GLUCOSE, CAPILLARY
Glucose-Capillary: 133 mg/dL — ABNORMAL HIGH (ref 70–99)
Glucose-Capillary: 131 mg/dL — ABNORMAL HIGH (ref 70–99)
Glucose-Capillary: 126 mg/dL — ABNORMAL HIGH (ref 70–99)
Glucose-Capillary: 194 mg/dL — ABNORMAL HIGH (ref 70–99)
Glucose-Capillary: 129 mg/dL — ABNORMAL HIGH (ref 70–99)
Glucose-Capillary: 125 mg/dL — ABNORMAL HIGH (ref 70–99)
Glucose-Capillary: 147 mg/dL — ABNORMAL HIGH (ref 70–99)

## 2020-01-06 LAB — BASIC METABOLIC PANEL
Anion gap: 12 (ref 5–15)
BUN: 44 mg/dL — ABNORMAL HIGH (ref 6–20)
CO2: 39 mmol/L — ABNORMAL HIGH (ref 22–32)
Calcium: 9.5 mg/dL (ref 8.9–10.3)
Chloride: 89 mmol/L — ABNORMAL LOW (ref 98–111)
Creatinine, Ser: 1.19 mg/dL (ref 0.61–1.24)
GFR calc Af Amer: >60 mL/min (ref >60)
GFR calc non Af Amer: >60 mL/min (ref >60)
Glucose, Bld: 125 mg/dL — ABNORMAL HIGH (ref 70–99)
Potassium: 4 mmol/L (ref 3.5–5.1)
Sodium: 140 mmol/L (ref 135–145)

## 2020-01-06 LAB — MAGNESIUM: Magnesium: 2.1 mg/dL (ref 1.7–2.4)

## 2020-01-06 NOTE — Progress Notes (Signed)
NAME:  Clifford Nichols, MRN:  [id number], DOB:  1980/10/16, LOS: 52 [facility] ADMISSION DATE:  12/04/2019, [facility] Admission DATE:  12/04/19 REFERRING MD:  Dr Mortimer Fries, CCM, CHIEF COMPLAINT:  Acute respiratory failure   Brief History   39 year old obese man with hypertension, diabetes admitted with acute respiratory failure, ARDS from COVID-19 pneumonia, initial positive test 5/10  Past Medical History    has a past medical history of Abscess of upper back excluding scapular region, Diabetes mellitus without complication ([place]), GERD (gastroesophageal reflux disease), Hypertension, and Sebaceous cyst.  Significant Hospital Events   5/10 admitted [facility]  5/11 failed HHFNC, BiPAP.  Required intubation 5/12 transferred to [facility], evaluated by opthal for proptosis- no intervention required 5/17 Started paralytics, ECMO team consulted Not a candidate 5/19 Started proning 5/23 Off pressors 5/26 Proning stopped, continue paralytics 5/27 New tongue wound 2/2 bite block in place, weaned off ketamine. Daily fevers 5/28 Trach, stop paralytics 6/1 Out of airborne isolation, family visiting, on PSV weans 6/6 Tolerated ATC yesterday for 12 hrs 6/7 remained on ATC 6/11 remains on ATC  Consults:  Ophthalmology for proptosis 5/12  Procedures:  ETT 5/11 >> 5/28 L femoral CVC 5/11 >> 5/17 R radial art line 5/17 >> 5/28 L IJ CVC 5/17 >> 6/1  Trach 5/28  Significant Diagnostic Tests:  Head CT 5/11 >> no significant intracranial abnormality.  Scattered opacification and air-fluid levels in the frontal, ethmoid, sphenoid and maxillary sinuses of unclear significance.  Head CT 5/12 >> no significant intracranial abnormality  Lower extremity Doppler 5/14 >> negative for DVT  Micro Data:  SARS CoV2 5/10 >> positive MRSA screen 5/12 >> negative Respiratory 5/12 >> negative Blood 5/17 >> Staph epi and Staph hominis Respiratory 5/17 >> Staph epi 5/24 resp cx bal: staph epi 5/24 BAL acid fast: smear negative cx  pending 5/24 BAL fungal:pending 5/26 blood: No growth BAL 5/28-Acinetobacter, Proteus, Pseudomonas, Enterococcus Trach asp 6/1- Pseudomonas  Antimicrobials:  Remdesivir 5/11 >> 5/15 Dexamethasone 5/11 >> 5/20 Tocilizumab 5/12 Cefepime 5/17 >> 5/21 Linezolid 5/17 >> 5/23 Zosyn 5/21 >>  5/29 Vanco 5/28 >> 6/1  Interim history/subjective:  Tolerated trach collar, did require bipap again overnight for desaturations  AKI resolving Cr at 1.19 this morning.   Feels well otherwise, denies significant pain or discomfort Awaiting placement Objective   Blood pressure (!) 124/48, pulse (!) 101, temperature 97.7 F (36.5 C), temperature source Oral, resp. rate 19, height _0  (1.702 m), weight (!) 159.5 kg, SpO2 100 %.    Vent Mode: BIPAP;PCV FiO2 (%):  [40 %] 40 % Set Rate:  [10 bmp-12 bmp] 12 bmp PEEP:  [5 cmH20] 5 cmH20   Intake/Output Summary (Last 24 hours) at 01/06/2020 1025 Last data filed at 01/05/2020 1800 Gross per 24 hour  Intake 330 ml  Output 600 ml  Net -270 ml   Filed Weights   01/01/20 0411 01/03/20 0416 01/04/20 0500  Weight: (!) 160.9 kg (!) 161.2 kg (!) 159.5 kg    Examination: General:  Morbidly obese [place] adult male resting comfortably in bed.  HEENT: Trach in place, on ATM Neuro: Awake, alert, phonates around trach.  CV: Tachycardic, regular, ext warm PULM:  Non labored, decreased air movement bilaterally, no added sounds Abdomen: Nontender   Resolved Hospital Problem list    Left proptosis, noted with coughing early 5/12, resolved.  Apparently per wife he has episodic intermittent proptosis at home as well. Head CT 5/12 reassuring Ophthalmology did evaluate, recommended that he will need outpatient  evaluation but no intervention at this time beyond keeping the eyes moist.  Septic shock, present on admission. Tongue necrosis secondary to biting down Hypertriglyceridemia Acinetobacter HAP Acute toxic metabolic encephalopathy    Assessment &  Plan:   Severe ARDS due to COVID-19 pneumonia  S/p trach  Morbid obesity, possible OSA Multiple pneumonias in the past Deconditioning - Still having nocturnal desaturations despite change to #6 proximal XLT trach 6/12.  -Trach switched to cuffed trach to allow for positive pressure ventilation at night - BiPAP at night - progressive mobility - push diuresis as tolerated by renal function as is being done - continue hypertonic saline and CPT - hopefully a candidate for CIR, exploring options for placement  AKI: improving -BUN/creatinine continues to improve - Continue lasix 57m daily assuming renal function continues to tolerate.   DM2 -Levemir  -SSI  Stage III pressure ulcer - Unstageable pressure injury  - WOCN following, appreciate input  - Hydrotherapy Q Mon-Sat by PT, continue santyl for enzymatic debridement - Continue to monitor   Hypoventilation, nocturnal desaturation: The patient has been requiring nightly BiPAP support for desaturations. Suspect this is related to some degree of obesity hypoventilation vs central apnea vs deconditioning.  - CTM - BiPAP at night as indicated for desaturations  Insomnia -Ambien 5 mg nightly  Dispo: - Pending LTACH placement -[facility]   PLorne Skeens Medical Student

## 2020-01-06 NOTE — Consult Note (Addendum)
WOC Nurse Consult Note: Sacrum with Unstageable pressure injury; 100% slough to outer wound is beginning to loosen and evolve into full thickness tissue loss. 5X3cm, swab instered to 3 cm in the center of the wound.  Pt is critically ill with multiple systemic factors which can impair healing.  Mod amt tan drainage, some odor.  Pressure Injury POA: No Dressing procedure/placement/frequency: Pt has been receiving Santyl for enzymatic debridement. Begin hydrotherapy which will be performed Q Mon-Sat by physical therapy to assist with removal of extensive amt nonviable tissue and continue Santyl. Pt is on a low airloss mattress to reduce pressure.  WOC nursing team will follow, seeing patient every 7-10 days and determine if a change in the plan of care is indicated at that time.  Cammie Mcgee MSN, RN, CWOCN, [place], CNS [phone]

## 2020-01-06 NOTE — Progress Notes (Signed)
Pt placed on cpap/PS ventilation. Will continue to monitor.

## 2020-01-06 NOTE — Progress Notes (Signed)
  Speech Language Pathology Treatment: Dysphagia;Passy Muir Speaking valve  Patient Details Name: Clifford Nichols MRN: [id number] DOB: February 21, 1981 Today's Date: 01/06/2020 Time: 1450-1520 SLP Time Calculation (min) (ACUTE ONLY): 30 min  Assessment / Plan / Recommendation Clinical Impression  SLP followed up for PMSV tx and dysphagia intervention. Pt s/p FEES 6/11 with continued NPO recommendations. Initiated pharyngeal strengthening exercises at bedside with pt, pts mother also present for training.  Pt completed exercises with SLP instruction; written copies detailed at bedside. Pt completed effortful swallow, Mendelson maneuver with PO ices chips. No s/sx of aspiration exhibited. Pt completed pitch glides and masako maneuver.    SLP continued assessment of PMSV use, pt on trach collar 28 percent oxygen. No distress noted with PMSV, RN suctioned prior to session; mild frothy secretions; cuff deflated. Pt with strong volitional cough, able to expel secretions at tracheal site and orally. Vitals remained stable. Pt with adequate intelligibility, continue PMSV during wake hours as tolerated. SLP to follow up      HPI HPI: 39 year old obese man with hypertension, diabetes, admitted 12/02/19 to [facility] with acute respiratory failure, ARDS from COVID-19 pneumonia, initial positive test 5/10. Intubated 5/11, required paralytics, proning, pressors, tracheostomy 5/28; Head CT 5/12 no abnormality. Tongue necrosis due to pt biting down when orally intubated      SLP Plan  Continue with current plan of care       Recommendations  Diet recommendations: NPO      Patient may use Passy-Muir Speech Valve: During all waking hours (remove during sleep) PMSV Supervision: Intermittent MD: Please consider changing trach tube to : Smaller size;Cuffless         Oral Care Recommendations: Oral care QID Follow up Recommendations: LTACH SLP Visit Diagnosis: Dysphagia, pharyngeal phase (R13.13) Plan: Continue  with current plan of care       GO                Adriell Polansky E Michaelah Credeur MA, CCC-SLP  Acute Rehabilitation Services  01/06/2020, 3:28 PM

## 2020-01-06 NOTE — Progress Notes (Signed)
Pt placed back on noninvasive mechanical ventilation through trache, due to continuous 02 desaturation. Rt will continue to monitor.

## 2020-01-06 NOTE — Progress Notes (Signed)
Assisted tele visit to patient with wife.  Sharleen Szczesny M Zuri Bradway, RN   

## 2020-01-06 NOTE — Progress Notes (Signed)
Assisted tele visit to patient with mother.  Princella Jaskiewicz M Jodi Criscuolo, RN   

## 2020-01-06 NOTE — Progress Notes (Signed)
Nutrition Follow-up  DOCUMENTATION CODES:   Morbid obesity  INTERVENTION:   If pt unable to have diet advanced, recommend considering G-tube placement.  Continuetube feeding via post-pyloric Cortrak tube: - Vital 1.5@ 54m/hr (1320 ml/day) - Pro-stat 60 ml TID - Free water per MD, currently 300 ml q 8 hours  Provides:2580kcal, 179 grams protein, and 9110mfree waterdaily.  Total free water with TF plus free water flush of300 mLevery 8hours: 181754m- Continue 1 packet Juven BID per tube, each packet provides 95 calories, 2.5 grams of protein, and 9.8 grams of carbohydrate; also contains 7 grams of L-arginine and L-glutamine, 300 mg vitamin C, 15 mg vitamin E, 1.2 mcg vitamin B-12, 9.5 mg zinc, 200 mg calcium, and 1.5 g  Calcium Beta-hydroxy-Beta-methylbutyrate to support wound healing  NUTRITION DIAGNOSIS:   Increased nutrient needs related to acute illness (COVID-19 PNA) as evidenced by estimated needs.  Ongoing, being addressed via TF  GOAL:   Patient will meet greater than or equal to 90% of their needs  Met via TF  MONITOR:   Weight trends, TF tolerance, Skin, I & O's, Labs  REASON FOR ASSESSMENT:   Consult, Ventilator Enteral/tube feeding initiation and management  ASSESSMENT:   Pt with PMH of HTN, DM, and morbid obesity admitted with acute respiratory failure/ARDS from COVID-19 PNA.  [facility] required [facility] 5/12 - [facility]; ophthalmology for proptosis. Per notes, RN having to push eye back into orbit therefore unable to prone pt. 5/19 - started proning 2/20 - pt vomiting, TF coming out of mouth, TF on hold 5/21 - post-pyloric Cortrak placed 5/28 - trach placed 5/31 - transferred to 16M from 42M, no longer requiring COVID restrictions 6/05 - trach collar  6/11 - FEES recommending NPO  Discussed patient in ICU rounds and with RN today. Pt continues on trach collar with BiPAP at night. Plan is for d/c to  CIR once pt is tolerating 35% FiO2.  Post-pyloric Cortrak remains in place with TF infusing. Pt failed FEES last week. If pt unable to have diet advanced, recommend considering G-tube placement for more permanent enteral access.  Pt receiving hydrotherapy per RN.  Weight continues to trend down overall. No new weights since 01/04/20. Pt with moderate pitting generalized edema per nursing documentation.  Current TF: Vital 1.5 @ 55 ml/hr, Pro-stat 60 ml TID, free water 300 ml q 8 hours  IVF: NS @ 10 ml/hr  Medications reviewed and include: vitamin C 500 mg daily, pepcid, Lasix, SSI q 4 hours, Levemir 5 units BID, Juven BID, KCl 20 mEq daily, zinc sulfate 220 mg daily  Labs reviewed. CBG's: 126-145 x 24 hours  UOP: 875 ml x 24 hours  Diet Order:   Diet Order            Diet NPO time specified  Diet effective now                 EDUCATION NEEDS:   No education needs have been identified at this time  Skin:  Skin Assessment: Skin Integrity Issues: DTI: left side of tongue Stage I: right ear Unstageable: sacrum Other: boil to lower right breast  Last BM:  01/04/20 medium type 2  Height:   Ht Readings from Last 1 Encounters:  12/14/19 5' 7"  (1.702 m)    Weight:   Wt Readings from Last 1 Encounters:  01/04/20 (!) 159.5 kg    Ideal Body Weight:  67.2 kg  BMI:  Body mass index is 55.07 kg/m.  Estimated Nutritional Needs:   Kcal:  2600-3000  Protein:  150-175 grams  Fluid:  2.4 L    Gaynell Face, MS, RD, LDN Inpatient Clinical Dietitian Pager: [phone] Weekend/After Hours: [phone]

## 2020-01-06 NOTE — Progress Notes (Signed)
Inpatient Rehabilitation-Admissions Coordinator   [facility] continues to follow this patient for medical readiness. Pt will need to be tolerating 35% FiO2 prior to being admitted to CIR.   Will continue to follow up.   Cheri Rous, OTR/L  Rehab Admissions Coordinator  [phone] 01/06/2020 10:39 AM

## 2020-01-06 NOTE — Progress Notes (Signed)
Assisted tele visit to patient with wife.  Jakayden Cancio M, RN  

## 2020-01-07 LAB — GLUCOSE, CAPILLARY
Glucose-Capillary: 133 mg/dL — ABNORMAL HIGH (ref 70–99)
Glucose-Capillary: 148 mg/dL — ABNORMAL HIGH (ref 70–99)
Glucose-Capillary: 181 mg/dL — ABNORMAL HIGH (ref 70–99)
Glucose-Capillary: 121 mg/dL — ABNORMAL HIGH (ref 70–99)
Glucose-Capillary: 117 mg/dL — ABNORMAL HIGH (ref 70–99)
Glucose-Capillary: 162 mg/dL — ABNORMAL HIGH (ref 70–99)

## 2020-01-07 LAB — BASIC METABOLIC PANEL
Anion gap: 13 (ref 5–15)
BUN: 50 mg/dL — ABNORMAL HIGH (ref 6–20)
CO2: 38 mmol/L — ABNORMAL HIGH (ref 22–32)
Calcium: 9.4 mg/dL (ref 8.9–10.3)
Chloride: 89 mmol/L — ABNORMAL LOW (ref 98–111)
Creatinine, Ser: 1.37 mg/dL — ABNORMAL HIGH (ref 0.61–1.24)
GFR calc Af Amer: >60 mL/min (ref >60)
GFR calc non Af Amer: >60 mL/min (ref >60)
Glucose, Bld: 142 mg/dL — ABNORMAL HIGH (ref 70–99)
Potassium: 3.5 mmol/L (ref 3.5–5.1)
Sodium: 140 mmol/L (ref 135–145)

## 2020-01-07 LAB — MAGNESIUM: Magnesium: 2.1 mg/dL (ref 1.7–2.4)

## 2020-01-07 NOTE — Progress Notes (Addendum)
Occupational Therapy Treatment Patient Details Name: Clifford Nichols MRN: [id number] DOB: 1980/10/05 Today's Date: 01/07/2020    History of present illness 39 year old obese man with hypertension, diabetes, admitted 12/02/19 to [facility] with acute respiratory failure, ARDS from COVID-19 pneumonia, initial positive test 5/10. Intubated 5/11, required paralytics, proning, pressors, tracheostomy 5/28; Head CT 5/12 no abnormality. Seen by opthalmology due to left eye proptosis (no intervention recommended). Tongue necrosis due to pt biting down when orally intubated   OT comments  Pt making steady progress towards OT goals, demonstrating great motivation to work and progress with therapies. Seen in conjunction with PT today to ensure safety with addition of increased balance/activity tolerance challenges during session. Pt practicing bimanual tasks in standing at tray table (to simulate standing at sink). Pt tolerating multiple tasks in standing, requiring min-modA for standing balance with use of single UE support and without any UE support. Pt intermittently requiring standing/seated rest with activity during session completion but overall tolerating well. Pt on trach collar initially on 35% FiO2 but able to titrate down to 30% FiO2 with SpO2 overall sustaining >/=88% with activity (PMSV also donned during this time). Pt's family present and very supportive throughout. Feel he remains an excellent candidate for CIR level therapies at time of discharge. Will continue to follow acutely.   Follow Up Recommendations  CIR    Equipment Recommendations  Tub/shower seat;Other (comment) (TBD)          Precautions / Restrictions Precautions Precautions: Fall Precaution Comments: watch RR, HR, sats Restrictions Weight Bearing Restrictions: No       Mobility Bed Mobility Overal bed mobility: Needs Assistance Bed Mobility: Supine to Sit     Supine to sit: Min guard     General bed mobility comments:  pt now can use momentum to assist coming up via right elbow  Transfers Overall transfer level: Needs assistance Equipment used: Rolling walker (2 wheeled) Transfers: Sit to/from Stand Sit to Stand: Min assist;+2 safety/equipment         General transfer comment: min for lower surfaces with arm rests or bed height    Balance Overall balance assessment: Needs assistance Sitting-balance support: Bilateral upper extremity supported;No upper extremity supported;Feet supported Sitting balance-Leahy Scale: Fair     Standing balance support: Bilateral upper extremity supported;During functional activity Standing balance-Leahy Scale: Poor (to fair) Standing balance comment: relies on bil UE on RW - practiced bimanual functional tasks today - pt more unsteady without UE support but tolerating challenge well - min-modA for balance without UE support                            ADL either performed or assessed with clinical judgement   ADL Overall ADL's : Needs assistance/impaired     Grooming: Wash/dry face;Brushing hair;Minimal assistance;Moderate assistance;Standing Grooming Details (indicate cue type and reason): +2 for safety with standing; min-modA for balance without UE support                              Functional mobility during ADLs: Minimal assistance;Moderate assistance;+2 for safety/equipment;Rolling walker General ADL Comments: pt with steady progress towards OT goals; practicing functional tasks in standing while challenging balance - including standing grooming ADL, donning/doffing gloves, reaching and placing items on table, reaching and reading aloud card from family  Cognition Arousal/Alertness: Awake/alert Behavior During Therapy: WFL for tasks assessed/performed Overall Cognitive Status: Within Functional Limits for tasks assessed                                          Exercises     Shoulder  Instructions       General Comments      Pertinent Vitals/ Pain       Pain Assessment: No/denies pain  Home Living                                          Prior Functioning/Environment              Frequency  Min 2X/week        Progress Toward Goals  OT Goals(current goals can now be found in the care plan section)  Progress towards OT goals: Progressing toward goals  Acute Rehab OT Goals Patient Stated Goal: get stronger, get to go to rehab OT Goal Formulation: With patient/family Time For Goal Achievement: 01/13/20 Potential to Achieve Goals: Good ADL Goals Pt Will Perform Grooming: with min assist;sitting Pt Will Perform Upper Body Bathing: with min assist;sitting Additional ADL Goal #1: pt will demonstrates eob sitting for 10 minutes supervision level  Additional ADL Goal #2: pt will complete 3 step command Additional ADL Goal #3: pt will participate in UE HEP with min (A)   Plan Discharge plan remains appropriate    Co-evaluation    PT/OT/SLP Co-Evaluation/Treatment: Yes Reason for Co-Treatment: For patient/therapist safety;To address functional/ADL transfers   OT goals addressed during session: ADL's and self-care      AM-PAC OT "6 Clicks" Daily Activity     Outcome Measure   Help from another person eating meals?: Total (NPO) Help from another person taking care of personal grooming?: A Little Help from another person toileting, which includes using toliet, bedpan, or urinal?: A Lot Help from another person bathing (including washing, rinsing, drying)?: A Lot Help from another person to put on and taking off regular upper body clothing?: A Lot Help from another person to put on and taking off regular lower body clothing?: A Lot 6 Click Score: 12    End of Session Equipment Utilized During Treatment: Oxygen;Rolling walker  OT Visit Diagnosis: Unsteadiness on feet (R26.81);Muscle weakness (generalized) (M62.81)   Activity  Tolerance Patient tolerated treatment well   Patient Left in chair;with call bell/phone within reach;with chair alarm set;with family/visitor present   Nurse Communication Mobility status        Time: 2725-3664 OT Time Calculation (min): 46 min  Charges: OT General Charges $OT Visit: 1 Visit OT Treatments $Self Care/Home Management : 23-37 mins  Marcy Siren, OT Acute Rehabilitation Services Pager [phone] Office [phone]    Orlando Penner 01/07/2020, 4:54 PM

## 2020-01-07 NOTE — Progress Notes (Signed)
NAME:  TROYE HIEMSTRA, MRN:  [id number], DOB:  1980/09/11, LOS: 6 [facility] ADMISSION DATE:  12/04/2019, [facility] Admission DATE:  12/04/19 REFERRING MD:  Dr Mortimer Fries, CCM, CHIEF COMPLAINT:  Acute respiratory failure   Brief History   39 year old obese man with hypertension, diabetes admitted with acute respiratory failure, ARDS from COVID-19 pneumonia, initial positive test 5/10  Past Medical History    has a past medical history of Abscess of upper back excluding scapular region, Diabetes mellitus without complication ([place]), GERD (gastroesophageal reflux disease), Hypertension, and Sebaceous cyst.  Significant Hospital Events   5/10 admitted [facility]  5/11 failed HHFNC, BiPAP.  Required intubation 5/12 transferred to [facility], evaluated by opthal for proptosis- no intervention required 5/17 Started paralytics, ECMO team consulted Not a candidate 5/19 Started proning 5/23 Off pressors 5/26 Proning stopped, continue paralytics 5/27 New tongue wound 2/2 bite block in place, weaned off ketamine. Daily fevers 5/28 Trach, stop paralytics 6/1 Out of airborne isolation, family visiting, on PSV weans 6/6 Tolerated ATC yesterday for 12 hrs 6/7 remained on ATC 6/11 remains on ATC  Consults:  Ophthalmology for proptosis 5/12  Procedures:  ETT 5/11 >> 5/28 L femoral CVC 5/11 >> 5/17 R radial art line 5/17 >> 5/28 L IJ CVC 5/17 >> 6/1  Trach 5/28  Significant Diagnostic Tests:  Head CT 5/11 >> no significant intracranial abnormality.  Scattered opacification and air-fluid levels in the frontal, ethmoid, sphenoid and maxillary sinuses of unclear significance.  Head CT 5/12 >> no significant intracranial abnormality  Lower extremity Doppler 5/14 >> negative for DVT  Micro Data:  SARS CoV2 5/10 >> positive MRSA screen 5/12 >> negative Respiratory 5/12 >> negative Blood 5/17 >> Staph epi and Staph hominis Respiratory 5/17 >> Staph epi 5/24 resp cx bal: staph epi 5/24 BAL acid fast: smear negative cx  pending 5/24 BAL fungal:pending 5/26 blood: No growth BAL 5/28-Acinetobacter, Proteus, Pseudomonas, Enterococcus Trach asp 6/1- Pseudomonas  Antimicrobials:  Remdesivir 5/11 >> 5/15 Dexamethasone 5/11 >> 5/20 Tocilizumab 5/12 Cefepime 5/17 >> 5/21 Linezolid 5/17 >> 5/23 Zosyn 5/21 >>  5/29 Vanco 5/28 >> 6/1  Interim history/subjective:  Tolerated trach collar, did require cpap/ pressure support again overnight for desaturations. Pt worked w/ Speech therapy and PT/OT yesterday and tolerated OOB. Speech is recommending smaller size, cuffless trach tube.   Per CIR, pt will need to tolerate 32% FiO2 prior to admission.   AKI worse this morning Cr 1.37, BUN 50.    Patient sleeping comfortably this morning on PS.    Objective   Blood pressure 130/79, pulse (!) 101, temperature 98.2 F (36.8 C), temperature source Oral, resp. rate 13, height _0  (1.702 m), weight (!) 158.5 kg, SpO2 97 %.    Vent Mode: CPAP;PSV FiO2 (%):  [28 %-40 %] 35 % PEEP:  [4 cmH20-5 cmH20] 4 cmH20 Pressure Support:  [20 cmH20] 20 cmH20   Intake/Output Summary (Last 24 hours) at 01/07/2020 0903 Last data filed at 01/07/2020 0600 Gross per 24 hour  Intake 1295 ml  Output 1580 ml  Net -285 ml   Filed Weights   01/03/20 0416 01/04/20 0500 01/07/20 0600  Weight: (!) 161.2 kg (!) 159.5 kg (!) 158.5 kg    Examination: General:  Morbidly obese [place] adult male resting comfortably in bed.  HEENT: Trach in place, on ATM Neuro: Awake, alert, phonates around trach.  CV: Tachycardic, regular, ext warm PULM:  Non labored, decreased air movement bilaterally, no added sounds Abdomen: Nontender   Resolved Hospital Problem  list    Left proptosis, noted with coughing early 5/12, resolved.  Apparently per wife he has episodic intermittent proptosis at home as well. Head CT 5/12 reassuring Ophthalmology did evaluate, recommended that he will need outpatient evaluation but no intervention at this time beyond  keeping the eyes moist.  Septic shock, present on admission. Tongue necrosis secondary to biting down Hypertriglyceridemia Acinetobacter HAP Acute toxic metabolic encephalopathy    Assessment & Plan:   Severe ARDS due to COVID-19 pneumonia  S/p trach  Morbid obesity, possible OSA Multiple pneumonias in the past Deconditioning - Still having nocturnal desaturations despite change to #6 proximal XLT trach 6/12.  -Trach switched to cuffed trach to allow for positive pressure ventilation at night - CPAP/PS at night - progressive mobility - push diuresis as tolerated by renal function as is being done - continue hypertonic saline and CPT - hopefully a candidate for CIR, exploring options for placement  AKI: Cr 1.37, BUN 50 - BUN/Cr worse to day at 36. Possible postrenal obstruction, less likely overdiuresis given that the patient has only been receiving 79m IV lasix and only had 1500cc UOP yesterday.  - Consider bladder scan to evaluate for post-void residual volume   - Continue lasix 476mdaily assuming renal function continues to tolerate.   DM2 -Levemir  -SSI  Stage III pressure ulcer - Unstageable pressure injury  - WOCN following, appreciate input  - Hydrotherapy Q Mon-Sat by PT, continue santyl for enzymatic debridement - Continue to monitor   Hypoventilation, nocturnal desaturation: Continues to require nightly CPAP/ PS. Suspect this is related to some degree of obesity hypoventilation vs central apnea vs deconditioning.  - CTM - BiPAP at night as indicated for desaturations  Insomnia -Ambien 5 mg nightly  Dispo: - Pending LTACH placement -[facility]  - Pt will need to tolerate FiO2 32% prior to CIR admission  PaLorne SkeensMedical Student

## 2020-01-07 NOTE — Progress Notes (Addendum)
Physical Therapy Treatment Patient Details Name: Clifford Nichols MRN: [id number] DOB: [birth year] Today's Date: 01/07/2020    History of Present Illness 39 year old obese man with hypertension, diabetes, admitted 12/02/19 to [facility] with acute respiratory failure, ARDS from COVID-19 pneumonia, initial positive test 5/10. Intubated 5/11, required paralytics, proning, pressors, tracheostomy 5/28; Head CT 5/12 no abnormality. Seen by opthalmology due to left eye proptosis (no intervention recommended). Tongue necrosis due to pt biting down when orally intubated    PT Comments    Pt eager to do activity OOB up on his feet.  Emphasis on standing/endurance activity that forced pt to work on balance with release hands to complete an ADL task.  Also progressed gait distance, stability and stamina.     Follow Up Recommendations  CIR     Equipment Recommendations  Other (comment)    Recommendations for Other Services       Precautions / Restrictions Precautions Precautions: Fall Precaution Comments: watch RR, HR, sats Restrictions Weight Bearing Restrictions: No    Mobility  Bed Mobility Overal bed mobility: Needs Assistance Bed Mobility: Supine to Sit Rolling: Min guard   Supine to sit: Min guard     General bed mobility comments: pt now can use momentum to assist coming up via right elbow  Transfers Overall transfer level: Needs assistance Equipment used: Rolling walker (2 wheeled) Transfers: Sit to/from Stand Sit to Stand: Min assist;+2 safety/equipment         General transfer comment: min for lower surfaces with arm rests or bed height  Ambulation/Gait Ambulation/Gait assistance: Mod assist;Min assist;+2 safety/equipment Gait Distance (Feet): 80 Feet (then 40 after a sitting rest) Assistive device: Rolling walker (2 wheeled) Gait Pattern/deviations: Step-through pattern Gait velocity: improved speed Gait velocity interpretation: <1.8 ft/sec, indicate of risk for  recurrent falls General Gait Details: unsteady overall, but improved ability to coordinate advancing each foot to an appropriate spot within the RW.  BOS is wide, contact in foot flat. pt able to clear toes with less height until fatigue when toes drag and knees become less stabile.   Stairs             Wheelchair Mobility    Modified Rankin (Stroke Patients Only)       Balance Overall balance assessment: Needs assistance Sitting-balance support: Bilateral upper extremity supported;No upper extremity supported;Feet supported Sitting balance-Leahy Scale: Fair     Standing balance support: Bilateral upper extremity supported;During functional activity Standing balance-Leahy Scale: Poor (to fair) Standing balance comment: relies on bil UE on RW - practiced bimanual functional tasks today - pt more unsteady without UE support but tolerating challenge well - min-modA for balance without UE support                             Cognition Arousal/Alertness: Awake/alert Behavior During Therapy: WFL for tasks assessed/performed Overall Cognitive Status: Within Functional Limits for tasks assessed                                        Exercises      General Comments General comments (skin integrity, edema, etc.): sats on 35% at upper 90's%, sats on 30% FiO2 between 88% and 95% during and between activities.  HR consistently up in the 120's and max into the 140's      Pertinent Vitals/Pain Pain Assessment: No/denies pain Faces  Pain Scale: Hurts a little bit Pain Location: sacral wound Pain Descriptors / Indicators: Grimacing;Discomfort Pain Intervention(s): Monitored during session    Home Living                      Prior Function            PT Goals (current goals can now be found in the care plan section) Acute Rehab PT Goals Patient Stated Goal: get stronger, get to go to rehab PT Goal Formulation: With patient Time For Goal  Achievement: 01/15/20 Potential to Achieve Goals: Good Progress towards PT goals: Progressing toward goals    Frequency    Min 4X/week      PT Plan Current plan remains appropriate    Co-evaluation PT/OT/SLP Co-Evaluation/Treatment: Yes Reason for Co-Treatment: For patient/therapist safety;To address functional/ADL transfers PT goals addressed during session: Mobility/safety with mobility OT goals addressed during session: ADL's and self-care      AM-PAC PT "6 Clicks" Mobility   Outcome Measure  Help needed turning from your back to your side while in a flat bed without using bedrails?: A Little Help needed moving from lying on your back to sitting on the side of a flat bed without using bedrails?: A Little Help needed moving to and from a bed to a chair (including a wheelchair)?: A Little Help needed standing up from a chair using your arms (e.g., wheelchair or bedside chair)?: A Little Help needed to walk in hospital room?: A Lot Help needed climbing 3-5 steps with a railing? : Total 6 Click Score: 15    End of Session Equipment Utilized During Treatment: Oxygen Activity Tolerance: Patient tolerated treatment well Patient left: in chair;with call bell/phone within reach;with family/visitor present;with chair alarm set Nurse Communication: Mobility status PT Visit Diagnosis: Unsteadiness on feet (R26.81);Other abnormalities of gait and mobility (R26.89);Muscle weakness (generalized) (M62.81);Difficulty in walking, not elsewhere classified (R26.2)     Time: 2248-2500 PT Time Calculation (min) (ACUTE ONLY): 46 min  Charges:  $Gait Training: 8-22 mins                     01/07/2020  Jacinto Halim., PT Acute Rehabilitation Services [phone]  (pager) [phone]  (office)   Eliseo Gum Eian Vandervelden 01/07/2020, 5:08 PM

## 2020-01-07 NOTE — Progress Notes (Signed)
Pt placed back on vent for night time rest CP/PS

## 2020-01-08 LAB — CBC
HCT: 31.6 % — ABNORMAL LOW (ref 39.0–52.0)
Hemoglobin: 9.5 g/dL — ABNORMAL LOW (ref 13.0–17.0)
MCH: 30 pg (ref 26.0–34.0)
MCHC: 30.1 g/dL (ref 30.0–36.0)
MCV: 99.7 fL (ref 80.0–100.0)
Platelets: 189 10*3/uL (ref 150–400)
RBC: 3.17 MIL/uL — ABNORMAL LOW (ref 4.22–5.81)
RDW: 14.8 % (ref 11.5–15.5)
WBC: 8.1 10*3/uL (ref 4.0–10.5)
nRBC: 0 % (ref 0.0–0.2)

## 2020-01-08 LAB — BASIC METABOLIC PANEL
Anion gap: 13 (ref 5–15)
BUN: 50 mg/dL — ABNORMAL HIGH (ref 6–20)
CO2: 38 mmol/L — ABNORMAL HIGH (ref 22–32)
Calcium: 9.3 mg/dL (ref 8.9–10.3)
Chloride: 88 mmol/L — ABNORMAL LOW (ref 98–111)
Creatinine, Ser: 1.31 mg/dL — ABNORMAL HIGH (ref 0.61–1.24)
GFR calc Af Amer: >60 mL/min (ref >60)
GFR calc non Af Amer: >60 mL/min (ref >60)
Glucose, Bld: 144 mg/dL — ABNORMAL HIGH (ref 70–99)
Potassium: 3.3 mmol/L — ABNORMAL LOW (ref 3.5–5.1)
Sodium: 139 mmol/L (ref 135–145)

## 2020-01-08 LAB — GLUCOSE, CAPILLARY
Glucose-Capillary: 107 mg/dL — ABNORMAL HIGH (ref 70–99)
Glucose-Capillary: 128 mg/dL — ABNORMAL HIGH (ref 70–99)
Glucose-Capillary: 129 mg/dL — ABNORMAL HIGH (ref 70–99)
Glucose-Capillary: 134 mg/dL — ABNORMAL HIGH (ref 70–99)
Glucose-Capillary: 140 mg/dL — ABNORMAL HIGH (ref 70–99)
Glucose-Capillary: 157 mg/dL — ABNORMAL HIGH (ref 70–99)

## 2020-01-08 LAB — MAGNESIUM: Magnesium: 2.1 mg/dL (ref 1.7–2.4)

## 2020-01-08 MED ORDER — POTASSIUM CHLORIDE 20 MEQ/15ML (10%) PO SOLN
40.0000 meq | Freq: Once | ORAL | Status: AC
  Administered 2020-01-08: 40 meq via ORAL
  Filled 2020-01-08: qty 30

## 2020-01-08 MED ORDER — CALCIUM GLUCONATE-NACL 1-0.675 GM/50ML-% IV SOLN: 1.0000 g | Freq: Once | INTRAVENOUS | Status: DC

## 2020-01-08 MED ORDER — CLONAZEPAM 0.5 MG PO TABS
0.5000 mg | ORAL_TABLET | Freq: Two times a day (BID) | ORAL | Status: DC
  Administered 2020-01-08 – 2020-01-10 (×5): 0.5 mg
  Filled 2020-01-08 (×5): qty 1

## 2020-01-08 MED ORDER — ACETAZOLAMIDE ER 500 MG PO CP12
500.0000 mg | ORAL_CAPSULE | Freq: Two times a day (BID) | ORAL | Status: AC
  Administered 2020-01-08 – 2020-01-09 (×3): 500 mg via ORAL
  Filled 2020-01-08 (×3): qty 1

## 2020-01-08 NOTE — Consult Note (Signed)
   [facility] CM Inpatient Consult   01/08/2020  Clifford Nichols 1980/10/11 [id number]  [place]: Focus plan:  day 34 ICU status to Progressive status  Patient is currently a pending pending status with [facility] [employer] [facility] [[facility]] Care Management for post hospital follow up with [facility] plan.  Patient has been assigned to [facility] RN Care Coordinator for support and benefits needs.  Our community based telephonic plan of care will focus on disease management and community resource support for post hospital disposition/transition of care follow up needs.  Plan:  Will follow for progress periodically.  Medical record reviewed for latest progress and Physical Therapy evaluations and patient is being recommended to an inpatient rehabilitation level of care.  Charlesetta Shanks, RN BSN CCM [facility]  [phone] business mobile phone Toll free office [phone]  Fax number: [phone] [email or web address]

## 2020-01-08 NOTE — Progress Notes (Signed)
Physical Therapy Treatment Patient Details Name: Clifford Nichols MRN: [id number] DOB: [birth year] Today's Date: 01/08/2020    History of Present Illness 39 year old obese man with hypertension, diabetes, admitted 12/02/19 to [facility] with acute respiratory failure, ARDS from COVID-19 pneumonia, initial positive test 5/10. Intubated 5/11, required paralytics, proning, pressors, tracheostomy 5/28; Head CT 5/12 no abnormality. Seen by opthalmology due to left eye proptosis (no intervention recommended). Tongue necrosis due to pt biting down when orally intubated    PT Comments     Improving toward goals.  Pt starting to feet great about how things are going.  Eager to get to CIR.  Emphasis on gait stability, stamina and quality   Follow Up Recommendations  CIR     Equipment Recommendations  Other (comment) (TBA)    Recommendations for Other Services       Precautions / Restrictions Precautions Precautions: Fall Precaution Comments: watch RR, HR, sats    Mobility  Bed Mobility Overal bed mobility: Needs Assistance Bed Mobility: Rolling;Sidelying to Sit Rolling: Min guard Sidelying to sit: Min guard       General bed mobility comments: becoming more effortless each session  Transfers Overall transfer level: Needs assistance Equipment used: Rolling walker (2 wheeled) Transfers: Sit to/from Stand Sit to Stand: Min assist         General transfer comment: min for stability less for boost and more once pt has attained stance and has trouble balancing.  Ambulation/Gait Ambulation/Gait assistance: Mod assist;Min assist;+2 physical assistance;+2 safety/equipment Gait Distance (Feet): 50 Feet (then 50 feet then a longer 70 feet to get back to the room.) Assistive device: Rolling walker (2 wheeled) Gait Pattern/deviations: Step-through pattern;Decreased stride length Gait velocity: slower Gait velocity interpretation: <1.8 ft/sec, indicate of risk for recurrent falls General Gait  Details: continues to be unsteady, but with improving step quality, better clearance, yet with fatigue gait pattern degrades and loses coordination.   Stairs             Wheelchair Mobility    Modified Rankin (Stroke Patients Only)       Balance   Sitting-balance support: Bilateral upper extremity supported;No upper extremity supported;Feet supported Sitting balance-Leahy Scale: Fair     Standing balance support: Bilateral upper extremity supported;During functional activity Standing balance-Leahy Scale: Poor Standing balance comment: relies on bil UE on RW                             Cognition Arousal/Alertness: Awake/alert Behavior During Therapy: WFL for tasks assessed/performed Overall Cognitive Status: Within Functional Limits for tasks assessed                                        Exercises      General Comments General comments (skin integrity, edema, etc.): HT up to 133 bpm , Sats on 28% TC dropped ito 86% with return to 94-96% at 30% TC.      Pertinent Vitals/Pain Pain Assessment: Faces Pain Score: 0-No pain Faces Pain Scale: Hurts a little bit Pain Location: sacral wound Pain Descriptors / Indicators: Discomfort Pain Intervention(s): Monitored during session    Home Living                      Prior Function            PT Goals (current goals can  now be found in the care plan section) Acute Rehab PT Goals Patient Stated Goal: get stronger, get to go to rehab PT Goal Formulation: With patient Time For Goal Achievement: 01/15/20 Potential to Achieve Goals: Good Progress towards PT goals: Progressing toward goals    Frequency    Min 4X/week      PT Plan Current plan remains appropriate    Co-evaluation              AM-PAC PT "6 Clicks" Mobility   Outcome Measure  Help needed turning from your back to your side while in a flat bed without using bedrails?: A Little Help needed moving from  lying on your back to sitting on the side of a flat bed without using bedrails?: A Little Help needed moving to and from a bed to a chair (including a wheelchair)?: A Little Help needed standing up from a chair using your arms (e.g., wheelchair or bedside chair)?: A Little Help needed to walk in hospital room?: A Lot Help needed climbing 3-5 steps with a railing? : Total 6 Click Score: 15    End of Session Equipment Utilized During Treatment: Oxygen Activity Tolerance: Patient tolerated treatment well Patient left: in chair;with call bell/phone within reach;with family/visitor present;with chair alarm set Nurse Communication: Mobility status PT Visit Diagnosis: Unsteadiness on feet (R26.81);Other abnormalities of gait and mobility (R26.89);Muscle weakness (generalized) (M62.81);Difficulty in walking, not elsewhere classified (R26.2)     Time: 4235-3614 PT Time Calculation (min) (ACUTE ONLY): 26 min  Charges:  $Gait Training: 8-22 mins $Therapeutic Activity: 8-22 mins                     01/08/2020  Jacinto Halim., PT Acute Rehabilitation Services [phone]  (pager) [phone]  (office)   Clifford Nichols 01/08/2020, 6:39 PM

## 2020-01-08 NOTE — Progress Notes (Signed)
Physical Therapy Wound Treatment Patient Details  Name: Clifford Nichols MRN: [id number] Date of Birth: 01-03-1981  Today's Date: 01/08/2020 Time: 8413-2440 Time Calculation (min): 43 min  Subjective  Subjective: Pt laying on belly ready for hydro on entry  Patient and Family Stated Goals: get back to his family Prior Treatments: dressing change, santyl  Pain Score:  Pt with 4/10 pain with treatment  Wound Assessment  Pressure Injury 12/19/19 Other (Comment) Left Deep Tissue Pressure Injury - Purple or maroon localized area of discolored intact skin or blood-filled blister due to damage of underlying soft tissue from pressure and/or shear. DTI tongue (Active)  Dressing Type None 01/08/20 0800  State of Healing Other (Comment) 01/07/20 2025  Site / Wound Assessment Clean;Dry 01/08/20 0800  Peri-wound Assessment Intact 01/07/20 2025  Wound Length (cm) 0.5 cm 01/01/20 1000  Wound Width (cm) 0.5 cm 01/01/20 1000  Wound Depth (cm) 0 cm 12/25/19 0835  Wound Surface Area (cm^2) 0.25 cm^2 01/01/20 1000  Wound Volume (cm^3) 0 cm^3 12/25/19 0835  Margins Attached edges (approximated) 01/07/20 1248  Drainage Amount None 01/07/20 1600  Treatment Other (Comment) 01/07/20 2025     Pressure Injury 12/26/19 Sacrum Mid Unstageable - Full thickness tissue loss in which the base of the injury is covered by slough (yellow, tan, gray, green or brown) and/or eschar (tan, brown or black) in the wound bed. (Active)  Wound Image   01/08/20 1400  Dressing Type ABD;Barrier Film (skin prep);Gauze (Comment);Moist to dry;Other (Comment) 01/08/20 1400  Dressing Changed 01/08/20 1400  Dressing Change Frequency Daily 01/08/20 1400  State of Healing Non-healing 01/08/20 1400  Site / Wound Assessment Yellow;Dusky;Brown;Friable 01/08/20 1400  % Wound base Yellow/Fibrinous Exudate 100% 01/08/20 1400  Peri-wound Assessment Maceration;Erythema (blanchable) 01/08/20 1400  Wound Length (cm) 7.5 cm 01/08/20 1400  Wound  Width (cm) 3.8 cm 01/08/20 1400  Wound Depth (cm) 2.8 cm 01/08/20 1400  Wound Surface Area (cm^2) 28.5 cm^2 01/08/20 1400  Wound Volume (cm^3) 79.8 cm^3 01/08/20 1400  Margins Unattached edges (unapproximated) 01/08/20 1400  Drainage Amount Minimal 01/08/20 1400  Drainage Description Serous 01/08/20 1400  Treatment Other (Comment);Debridement (Selective);Hydrotherapy (Pulse lavage);Packing (Saline gauze) 01/08/20 1400   Santyl applied to nonviable tissue  Hydrotherapy Pulsed lavage therapy - wound location: sacrum Pulsed Lavage with Suction (psi): 12 psi Pulsed Lavage with Suction - Normal Saline Used: 1000 mL Pulsed Lavage Tip: Tip with splash shield Selective Debridement Selective Debridement - Location: sacrum Selective Debridement - Tools Used: Forceps;Scissors;Scalpel Selective Debridement - Tissue Removed: yellow slough material    Wound Assessment and Plan  Wound Therapy - Assess/Plan/Recommendations Wound Therapy - Clinical Statement: Pt edcuated on hydrotherapy process and goals, Eschar layer has been removed from the distal portion of the wound and underlying yellow sloughy material has been exposed. Once wound bed is cleaned up, hopeful for placement of wound vac.Pt will benefit from pulsed lavage and selective debridement to decrease bioburden and improve wound healing.  Wound Therapy - Functional Problem List: continues to need mechanical ventilation at night Factors Delaying/Impairing Wound Healing: Multiple medical problems;Immobility Hydrotherapy Plan: Debridement;Dressing change;Patient/family education;Pulsatile lavage with suction Wound Therapy - Frequency: 6X / week Wound Therapy - Follow Up Recommendations: Diagonal Wound Plan: see above  Wound Therapy Goals- Improve the function of patient's integumentary system by progressing the wound(s) through the phases of wound healing (inflammation - proliferation - remodeling) by: Decrease Necrotic Tissue to:  75 Decrease Necrotic Tissue - Progress: Goal set today Increase Granulation Tissue to:  25 Increase Granulation Tissue - Progress: Goal set today Goals/treatment plan/discharge plan were made with and agreed upon by patient/family: Yes Time For Goal Achievement: 7 days Wound Therapy - Potential for Goals: Excellent  Goals will be updated until maximal potential achieved or discharge criteria met.  Discharge criteria: when goals achieved, discharge from hospital, MD decision/surgical intervention, no progress towards goals, refusal/missing three consecutive treatments without notification or medical reason.  GP    Dani Gobble. Migdalia Dk PT, DPT Acute Rehabilitation Services Pager [phone] Office [phone]  [place] 01/08/2020, 2:54 PM

## 2020-01-08 NOTE — Progress Notes (Signed)
NAME:  Clifford Nichols, MRN:  [id number], DOB:  Oct 21, 1980, LOS: 59 [facility] ADMISSION DATE:  12/04/2019, [facility] Admission DATE:  12/04/19 REFERRING MD:  Dr Mortimer Fries, CCM, CHIEF COMPLAINT:  Acute respiratory failure   Brief History   39 year old obese man with hypertension, diabetes admitted with acute respiratory failure, ARDS from COVID-19 pneumonia, initial positive test 5/10  Past Medical History    has a past medical history of Abscess of upper back excluding scapular region, Diabetes mellitus without complication ([place]), GERD (gastroesophageal reflux disease), Hypertension, and Sebaceous cyst.  Significant Hospital Events   5/10 admitted [facility]  5/11 failed HHFNC, BiPAP.  Required intubation 5/12 transferred to [facility], evaluated by opthal for proptosis- no intervention required 5/17 Started paralytics, ECMO team consulted Not a candidate 5/19 Started proning 5/23 Off pressors 5/26 Proning stopped, continue paralytics 5/27 New tongue wound 2/2 bite block in place, weaned off ketamine. Daily fevers 5/28 Trach, stop paralytics 6/1 Out of airborne isolation, family visiting, on PSV weans 6/6 Tolerated ATC yesterday for 12 hrs 6/7 remained on ATC 6/11 remains on ATC  Consults:  Ophthalmology for proptosis 5/12  Procedures:  ETT 5/11 >> 5/28 L femoral CVC 5/11 >> 5/17 R radial art line 5/17 >> 5/28 L IJ CVC 5/17 >> 6/1  Trach 5/28  Significant Diagnostic Tests:  Head CT 5/11 >> no significant intracranial abnormality.  Scattered opacification and air-fluid levels in the frontal, ethmoid, sphenoid and maxillary sinuses of unclear significance.  Head CT 5/12 >> no significant intracranial abnormality  Lower extremity Doppler 5/14 >> negative for DVT  Micro Data:  SARS CoV2 5/10 >> positive MRSA screen 5/12 >> negative Respiratory 5/12 >> negative Blood 5/17 >> Staph epi and Staph hominis Respiratory 5/17 >> Staph epi 5/24 resp cx bal: staph epi 5/24 BAL acid fast: smear negative cx  pending 5/24 BAL fungal:pending 5/26 blood: No growth BAL 5/28-Acinetobacter, Proteus, Pseudomonas, Enterococcus Trach asp 6/1- Pseudomonas  Antimicrobials:  Remdesivir 5/11 >> 5/15 Dexamethasone 5/11 >> 5/20 Tocilizumab 5/12 Cefepime 5/17 >> 5/21 Linezolid 5/17 >> 5/23 Zosyn 5/21 >>  5/29 Vanco 5/28 >> 6/1  Interim history/subjective:  Patient tolerating nightly CPAP/PS   Pt worked w/ Speech therapy and PT/OT yesterday and tolerated OOB. Walked in the hallway well. Patient continues to work on re-conditioning. Will move to progressive care today.  Per CIR, pt will need to tolerate 32% FiO2 prior to admission.     AKI stable from yesterday.   Objective   Blood pressure (!) 115/39, pulse (!) 113, temperature 98.2 F (36.8 C), temperature source Oral, resp. rate (!) 21, height _0  (1.702 m), weight (!) 158.5 kg, SpO2 94 %.    Vent Mode: CPAP FiO2 (%):  [35 %] 35 % Set Rate:  [12 bmp] 12 bmp Pressure Support:  [20 cmH20] 20 cmH20   Intake/Output Summary (Last 24 hours) at 01/08/2020 0848 Last data filed at 01/08/2020 0615 Gross per 24 hour  Intake 1954 ml  Output 1150 ml  Net 804 ml   Filed Weights   01/03/20 0416 01/04/20 0500 01/07/20 0600  Weight: (!) 161.2 kg (!) 159.5 kg (!) 158.5 kg    Examination: General:  Morbidly obese [name] adult male resting comfortably in bed.  HEENT: Trach in place, on ATM Neuro: Awake, alert, phonates around trach.  CV: Tachycardic, regular, ext warm PULM:  Non labored, decreased air movement bilaterally, no added sounds Abdomen: Nontender   Resolved Hospital Problem list    Left proptosis, noted with coughing early  5/12, resolved.  Apparently per wife he has episodic intermittent proptosis at home as well. Head CT 5/12 reassuring Ophthalmology did evaluate, recommended that he will need outpatient evaluation but no intervention at this time beyond keeping the eyes moist.  Septic shock, present on admission. Tongue necrosis  secondary to biting down Hypertriglyceridemia Acinetobacter HAP Acute toxic metabolic encephalopathy    Assessment & Plan:   Severe ARDS due to COVID-19 pneumonia  S/p trach  Morbid obesity, possible OSA Multiple pneumonias in the past Deconditioning  Still having nocturnal desaturations despite change to #6 proximal XLT trach 6/12. Trach switched to cuffed trach to allow for positive pressure ventilation at night. Patient tolerating CPAP/PS at night.  - CPAP/PS at night - progressive mobility - push diuresis as tolerated by renal function as is being done - continue hypertonic saline and CPT - hopefully a candidate for CIR, exploring options for placement  AKI: Cr 1.31, BUN 50 - BUN/Cr stable today.  - Continue lasix 68m daily assuming renal function continues to tolerate.   DM2 -Levemir  -SSI  Stage III pressure ulcer - Unstageable pressure injury  - WOCN following, appreciate input  - Hydrotherapy Q Mon-Sat by PT, continue santyl for enzymatic debridement - Continue to monitor   Hypoventilation, nocturnal desaturation: Continues to require nightly CPAP/ PS. Suspect this is related to some degree of obesity hypoventilation vs central apnea vs deconditioning. We are also considering contraction alkalosis as a possible cause for decreased respiratory drive overnight.  - Start acetazolamide 5063mBID - CTM - BiPAP at night as indicated for desaturations  Insomnia -Ambien 5 mg nightly  Dispo: - Pending LTACH placement -[facility]  - Pt will need to tolerate FiO2 32% prior to CIR admission  PaLorne SkeensMedical Student

## 2020-01-08 NOTE — Progress Notes (Signed)
°  Speech Language Pathology Treatment: Dysphagia  Patient Details Name: Clifford Nichols MRN: [id number] DOB: 08/24/1980 Today's Date: 01/08/2020 Time: 6222-9798 SLP Time Calculation (min) (ACUTE ONLY): 8 min  Assessment / Plan / Recommendation Clinical Impression  Pt alert, independent in reposition, tolerating trach collar and PMSV all waking hours. Vocal quality is clear, pt independently clears secretions orally with a hard cough. He has been consuming ice chips and completing swallow exercises. SLP observed pt consume 4 oz of puree and 8 oz of water. 3 oz consumed consecutively via straw with no cough response, no wet vocal quality, swift swallow with no multiple swallows. Overall, pt appears much improved since last week, particularly in arousal and strength. He is ambulating with PT. Pt is ready to initiate a PO diet with supervision. He is only interested in full liquids, which is appropriate given his healing lingual wound. Will trial full liquid diet and f/u for tolerance.   HPI HPI: 39 year old obese man with hypertension, diabetes, admitted 12/02/19 to [facility] with acute respiratory failure, ARDS from COVID-19 pneumonia, initial positive test 5/10. Intubated 5/11, required paralytics, proning, pressors, tracheostomy 5/28; Head CT 5/12 no abnormality. Tongue necrosis due to pt biting down when orally intubated      SLP Plan  Continue with current plan of care       Recommendations  Diet recommendations: Thin liquid Liquids provided via: Cup;Straw Medication Administration: Whole meds with liquid Supervision: Patient able to self feed Postural Changes and/or Swallow Maneuvers: Seated upright 90 degrees      Patient may use Passy-Muir Speech Valve: During all waking hours (remove during sleep);During PO intake/meals PMSV Supervision: Intermittent MD: Please consider changing trach tube to : Smaller size;Cuffless         SLP Visit Diagnosis: Dysphagia, pharyngeal phase  (R13.13) Plan: Continue with current plan of care       GO                [name], Riley Nearing 01/08/2020, 9:42 AM

## 2020-01-08 NOTE — Progress Notes (Signed)
eLink Physician-Brief Progress Note Patient Name: Clifford Nichols DOB: 10-09-1980 MRN: [id number]   Date of Service  01/08/2020  HPI/Events of Note  Hypokalemia - K+ = 3.3 and Creatinine = 1.31.   eICU Interventions  Will replace K+.      Intervention Category Major Interventions: Electrolyte abnormality - evaluation and management  Donnelle Rubey Eugene 01/08/2020, 6:00 AM

## 2020-01-09 LAB — BASIC METABOLIC PANEL
Anion gap: 12 (ref 5–15)
BUN: 32 mg/dL — ABNORMAL HIGH (ref 6–20)
CO2: 31 mmol/L (ref 22–32)
Calcium: 9.5 mg/dL (ref 8.9–10.3)
Chloride: 92 mmol/L — ABNORMAL LOW (ref 98–111)
Creatinine, Ser: 1.1 mg/dL (ref 0.61–1.24)
GFR calc Af Amer: >60 mL/min (ref >60)
GFR calc non Af Amer: >60 mL/min (ref >60)
Glucose, Bld: 157 mg/dL — ABNORMAL HIGH (ref 70–99)
Potassium: 3.6 mmol/L (ref 3.5–5.1)
Sodium: 135 mmol/L (ref 135–145)

## 2020-01-09 LAB — GLUCOSE, CAPILLARY
Glucose-Capillary: 130 mg/dL — ABNORMAL HIGH (ref 70–99)
Glucose-Capillary: 162 mg/dL — ABNORMAL HIGH (ref 70–99)
Glucose-Capillary: 150 mg/dL — ABNORMAL HIGH (ref 70–99)
Glucose-Capillary: 192 mg/dL — ABNORMAL HIGH (ref 70–99)
Glucose-Capillary: 150 mg/dL — ABNORMAL HIGH (ref 70–99)
Glucose-Capillary: 115 mg/dL — ABNORMAL HIGH (ref 70–99)

## 2020-01-09 MED ORDER — ENSURE ENLIVE PO LIQD
237.0000 mL | Freq: Three times a day (TID) | ORAL | Status: DC
  Administered 2020-01-09 – 2020-01-10 (×3): 237 mL via ORAL

## 2020-01-09 MED ORDER — JUVEN PO PACK
1.0000 | PACK | Freq: Two times a day (BID) | ORAL | Status: DC
  Administered 2020-01-09 – 2020-01-10 (×3): 1 via ORAL
  Filled 2020-01-09 (×2): qty 1

## 2020-01-09 NOTE — Progress Notes (Signed)
Nutrition Follow-up  RD working remotely.  DOCUMENTATION CODES:   Morbid obesity  INTERVENTION:   - d/c tube feeding orders  - 1 packet Juven po BID, each packet provides 95 calories, 2.5 grams of protein (collagen), and 9.8 grams of carbohydrate (3 grams sugar); also contains 7 grams of L-arginine and L-glutamine, 300 mg vitamin C, 15 mg vitamin E, 1.2 mcg vitamin B-12, 9.5 mg zinc, 200 mg calcium, and 1.5 g  Calcium Beta-hydroxy-Beta-methylbutyrate to support wound healing  - Ensure Enlive po TID, each supplement provides 350 kcal and 20 grams of protein  - Magic Cup TID with meals, each supplement provides 290 kcal and 9 grams of protein  - Encourage adequate PO intake  NUTRITION DIAGNOSIS:   Increased nutrient needs related to acute illness (COVID-19 PNA) as evidenced by estimated needs.  Ongoing  GOAL:   Patient will meet greater than or equal to 90% of their needs  Progressing  MONITOR:   PO intake, Supplement acceptance, Labs, Weight trends, Skin, I & O's  REASON FOR ASSESSMENT:   Consult, Ventilator Enteral/tube feeding initiation and management  ASSESSMENT:   Pt with PMH of HTN, DM, and morbid obesity admitted with acute respiratory failure/ARDS from COVID-19 PNA.  5/10-admit to [facility] 5/11-required urgent intubation 5/12- transferto [facility] 5/12; ophthalmology for proptosis. Per notes,RN having to push eye back into orbit therefore unable to prone pt. 5/19- started proning 2/20- pt vomiting, TF coming out of mouth, TF on hold 5/21- post-pyloric Cortrak placed 5/28-trach placed 5/31-transferred to 81M from 24M, no longer requiring COVID restrictions 6/05 - trach collar  6/11 - FEES recommending NPO 6/16 - diet advanced to full liquids 6/17 - diet advanced to Regular  Pt receiving hydrotherapy for sacral pressure injury.  Spoke with MD. Plan is to d/c Cortrak today. RD will d/c tube feeding orders and order oral nutrition supplements to aid pt  in meeting kcal and protein needs. Discussed plan with RN.  Current TF: Vital 1.5 @ 55 ml/hr, Pro-stat 60 ml TID  Medications reviewed and include: vitamin C 500 mg daily, SSI q 4 hours, Levemir 5 units BID, Juven BID, KCl 20 mEq daily, zinc sulfate  Labs reviewed: potassium 3.3, hemoglobin 9.5 CBG's: 107-162 x 24 hours  UOP: 1300 ml x 24 hours  Diet Order:   Diet Order            Diet regular Room service appropriate? Yes with Assist; Fluid consistency: Thin  Diet effective now                 EDUCATION NEEDS:   No education needs have been identified at this time  Skin:  Skin Assessment: Skin Integrity Issues: DTI: left side of tongue Stage I: right ear Unstageable: sacrum Other: boil to lower right breast  Last BM:  01/06/20  Height:   Ht Readings from Last 1 Encounters:  12/14/19 5\' 7"  (1.702 m)    Weight:   Wt Readings from Last 1 Encounters:  01/07/20 (!) 158.5 kg    Ideal Body Weight:  67.2 kg  BMI:  Body mass index is 54.73 kg/m.  Estimated Nutritional Needs:   Kcal:  2600-3000  Protein:  150-175 grams  Fluid:  2.4 L    01/09/20, MS, RD, LDN Inpatient Clinical Dietitian Pager: [phone] Weekend/After Hours: [phone]

## 2020-01-09 NOTE — Progress Notes (Signed)
Early Progress Note  Pt had been sitting waiting for therapy for hours, so need more assist for stability.  Pt chose to go to the bathroom, so therapy was based on manipulating the RW and stepping, w/shifting ans turning in a confined space.    01/09/20 1600  PT Visit Information  Last PT Received On 01/09/20  Assistance Needed +2  History of Present Illness 39 year old obese man with hypertension, diabetes, admitted 12/02/19 to [facility] with acute respiratory failure, ARDS from COVID-19 pneumonia, initial positive test 5/10. Intubated 5/11, required paralytics, proning, pressors, tracheostomy 5/28; Head CT 5/12 no abnormality. Seen by opthalmology due to left eye proptosis (no intervention recommended). Tongue necrosis due to pt biting down when orally intubated  Subjective Data  Patient Stated Goal get stronger, get to go to rehab  Precautions  Precautions Fall  Precaution Comments watch RR, HR, sats  Pain Assessment  Pain Assessment Faces  Faces Pain Scale 2  Pain Location sacral wound  Pain Descriptors / Indicators Discomfort  Pain Intervention(s) Monitored during session  Cognition  Arousal/Alertness Awake/alert  Behavior During Therapy [facility] for tasks assessed/performed  Overall Cognitive Status Within Functional Limits for tasks assessed  Bed Mobility  Overal bed mobility Needs Assistance  Bed Mobility Rolling;Sidelying to Sit  Rolling Min guard  Sidelying to sit Min guard  General bed mobility comments pt more safe and coordinated on this ICU bed than the low air loss bed.  Transfers  Overall transfer level Needs assistance  Equipment used Rolling walker (2 wheeled)  Transfers Sit to/from Stand  Sit to Stand Min assist  General transfer comment needs stability assist due to his limited ankle/foot function.  Ambulation/Gait  Ambulation/Gait assistance Min assist;Mod assist;+2 safety/equipment  Gait Distance (Feet) 15 Feet  Assistive device Rolling walker (2 wheeled)  Gait  Pattern/deviations Step-through pattern;Decreased stride length;Decreased dorsiflexion - right;Decreased dorsiflexion - left  General Gait Details initially stiff needing more assist due to sitting too long.  Worked on turning/stepping and w/shifts in a confined space of the bathroom.  Gait velocity slower  Balance  Sitting-balance support Bilateral upper extremity supported;No upper extremity supported;Feet supported  Sitting balance-Leahy Scale Fair  Standing balance support Bilateral upper extremity supported;During functional activity  Standing balance-Leahy Scale Poor  Standing balance comment relies on bil UE on RW   General Comments  General comments (skin integrity, edema, etc.) VSS, going to the bathroom on RA.  PT - End of Session  Activity Tolerance Patient tolerated treatment well  Patient left with call bell/phone within reach;Other (comment);with family/visitor present Agricultural consultant)  Nurse Communication Mobility status   PT - Assessment/Plan  PT Plan Current plan remains appropriate  PT Visit Diagnosis Unsteadiness on feet (R26.81);Other abnormalities of gait and mobility (R26.89);Muscle weakness (generalized) (M62.81);Difficulty in walking, not elsewhere classified (R26.2)  PT Frequency (ACUTE ONLY) Min 4X/week  Follow Up Recommendations CIR  PT equipment Other (comment) (TBA)  AM-PAC PT "6 Clicks" Mobility Outcome Measure (Version 2)  Help needed turning from your back to your side while in a flat bed without using bedrails? 3  Help needed moving from lying on your back to sitting on the side of a flat bed without using bedrails? 3  Help needed moving to and from a bed to a chair (including a wheelchair)? 3  Help needed standing up from a chair using your arms (e.g., wheelchair or bedside chair)? 3  Help needed to walk in hospital room? 2  Help needed climbing 3-5 steps with a railing?  1  6 Click Score 15  Consider Recommendation of Discharge To: CIR/SNF/LTACH  PT Goal  Progression  Progress towards PT goals Progressing toward goals  Acute Rehab PT Goals  PT Goal Formulation With patient  Time For Goal Achievement 01/15/20  Potential to Achieve Goals Good  PT Time Calculation  PT Start Time (ACUTE ONLY) 1440  PT Stop Time (ACUTE ONLY) 1449  PT Time Calculation (min) (ACUTE ONLY) 9 min  PT General Charges  $$ ACUTE PT VISIT 1 Visit  PT Treatments  $Gait Training 8-22 mins   01/09/2020  [name], PT Acute Rehabilitation Services [phone]  (pager) [phone]  (office)

## 2020-01-09 NOTE — Progress Notes (Signed)
Later Progress Note  Pt eager to get out in the halls.  As in other notes, pt's gait quality tells the therapist (and pt) when he is fatigued and ready to sit by a noticeable decline in coordination and knee stability.    01/09/20 1702  PT Visit Information  Last PT Received On 01/09/20  Assistance Needed +2  History of Present Illness 39 year old obese man with hypertension, diabetes, admitted 12/02/19 to [facility] with acute respiratory failure, ARDS from COVID-19 pneumonia, initial positive test 5/10. Intubated 5/11, required paralytics, proning, pressors, tracheostomy 5/28; Head CT 5/12 no abnormality. Seen by opthalmology due to left eye proptosis (no intervention recommended). Tongue necrosis due to pt biting down when orally intubated  Subjective Data  Patient Stated Goal get stronger, get to go to rehab  Precautions  Precautions Fall  Precaution Comments watch RR, HR, sats  Pain Assessment  Pain Assessment Faces  Faces Pain Scale 2  Pain Location sacral wound  Pain Descriptors / Indicators Discomfort  Cognition  Arousal/Alertness Awake/alert  Behavior During Therapy WFL for tasks assessed/performed  Overall Cognitive Status Within Functional Limits for tasks assessed  Bed Mobility  Overal bed mobility Needs Assistance  Bed Mobility Sit to Sidelying  Sit to sidelying Mod assist  General bed mobility comments pt more safe and coordinated on this ICU bed than the low air loss bed.  Transfers  Overall transfer level Needs assistance  Equipment used Rolling walker (2 wheeled)  Transfers Sit to/from Stand  Sit to Stand Min assist  General transfer comment needs stability assist due to his limited ankle/foot function.  Ambulation/Gait  Ambulation/Gait assistance Min assist;Mod assist;+2 safety/equipment  Gait Distance (Feet) 40 Feet (then 70 feet and finally an additional 40 feet)  Assistive device Rolling walker (2 wheeled)  Gait Pattern/deviations Step-through pattern;Decreased  stride length;Decreased dorsiflexion - right;Decreased dorsiflexion - left  General Gait Details continues to be mildly unsteady, generally has improved foot placement, heel/toe is a work in progress.  Muscle fatigue is noticeable as it come on invariably. but pt no longer is tired.   Gait velocity slower  Balance  Sitting-balance support Bilateral upper extremity supported;No upper extremity supported;Feet supported  Sitting balance-Leahy Scale Fair  Standing balance support Bilateral upper extremity supported;During functional activity  Standing balance-Leahy Scale Poor  Standing balance comment relies on bil UE on RW   General Comments  General comments (skin integrity, edema, etc.) EHR rising into the 130's, Sats maintained between 95 and 98% on RA during gait with a PMV in place otherwise on 28% TC (P)   PT - End of Session  Activity Tolerance Patient tolerated treatment well  Patient left with call bell/phone within reach;Other (comment);with family/visitor present  Nurse Communication Mobility status   PT - Assessment/Plan  PT Plan Current plan remains appropriate  PT Visit Diagnosis Unsteadiness on feet (R26.81);Other abnormalities of gait and mobility (R26.89);Muscle weakness (generalized) (M62.81);Difficulty in walking, not elsewhere classified (R26.2)  PT Frequency (ACUTE ONLY) Min 4X/week  Follow Up Recommendations CIR  PT equipment  (TBA)  AM-PAC PT "6 Clicks" Mobility Outcome Measure (Version 2)  Help needed turning from your back to your side while in a flat bed without using bedrails? 3  Help needed moving from lying on your back to sitting on the side of a flat bed without using bedrails? 3  Help needed moving to and from a bed to a chair (including a wheelchair)? 3  Help needed standing up from a chair using your arms (e.g.,  wheelchair or bedside chair)? 3  Help needed to walk in hospital room? 2  Help needed climbing 3-5 steps with a railing?  1  6 Click Score 15   Consider Recommendation of Discharge To: CIR/SNF/LTACH  PT Goal Progression  Progress towards PT goals Progressing toward goals  Acute Rehab PT Goals  PT Goal Formulation With patient  Time For Goal Achievement 01/15/20  Potential to Achieve Goals Good  PT Time Calculation  PT Start Time (ACUTE ONLY) 1545  PT Stop Time (ACUTE ONLY) 1610  PT Time Calculation (min) (ACUTE ONLY) 25 min  PT General Charges  $$ ACUTE PT VISIT 1 Visit  PT Treatments  $Gait Training 8-22 mins  $Therapeutic Activity 8-22 mins   01/09/2020  [name], PT Acute Rehabilitation Services [phone]  (pager) [phone]  (office)

## 2020-01-09 NOTE — Progress Notes (Signed)
  Speech Language Pathology Treatment: Dysphagia;Passy Muir Speaking valve  Patient Details Name: Clifford Nichols MRN: [id number] DOB: 09/19/1980 Today's Date: 01/09/2020 Time: 8347-5830 SLP Time Calculation (min) (ACUTE ONLY): 10 min  Assessment / Plan / Recommendation Clinical Impression  RN reports pt has tolerated full liquid diet well and has reported interest in regular food. Pt found wearing PMSV appropriately with cuff deflated. Provided further education about PMSV use with cuff - that staff should be the first to place in the am. Pt verbalized understanding of removal for sleep. Trials of thin liquids and regular solids tolerated without incident. Pt ready to advance, likely ready for Cortrak removal. Will advance diet and sign off for swallowing. Will follow up for increased independence with PMSV, which is more appropriate with a cuffless trach. RN also providing good education to Pt.   HPI HPI: 39 year old obese man with hypertension, diabetes, admitted 12/02/19 to [facility] with acute respiratory failure, ARDS from COVID-19 pneumonia, initial positive test 5/10. Intubated 5/11, required paralytics, proning, pressors, tracheostomy 5/28; Head CT 5/12 no abnormality. Tongue necrosis due to pt biting down when orally intubated      SLP Plan  All goals met       Recommendations  Diet recommendations: Regular;Thin liquid Liquids provided via: Cup;Straw Medication Administration: Whole meds with liquid Supervision: Patient able to self feed      Patient may use Passy-Muir Speech Valve: During PO intake/meals;During all waking hours (remove during sleep) PMSV Supervision: Intermittent MD: Please consider changing trach tube to : Smaller size;Cuffless         Plan: All goals met       GO               Herbie Baltimore, MA [place] Pager [phone] Office [phone]  Lynann Beaver 01/09/2020, 9:28 AM

## 2020-01-09 NOTE — Progress Notes (Signed)
Inpatient Rehabilitation-Admissions Coordinator   Spoke with pt's RN this AM to confirm that the patient was able to tolerate the entire night without the vent. He is tolerating 35% Fi02. AC will begin insurance authorization request for possible IP Rehab admission.  Will update once there has been a determination.   Cheri Rous, OTR/L  Rehab Admissions Coordinator  [phone] 01/09/2020 11:36 AM

## 2020-01-09 NOTE — Progress Notes (Signed)
Physical Therapy Wound Treatment Patient Details  Name: Clifford Nichols MRN: [id number] Date of Birth: [birth year]  Today's Date: 01/09/2020 Time: 6734-1937 Time Calculation (min): 24 min  Subjective  Subjective: Pt in sidelying upon arrival, agreeable to hydrotherapy  Patient and Family Stated Goals: get back to his family Prior Treatments: dressing change, santyl  Pain Score: 4/10; premedicated  Wound Assessment  Pressure Injury 12/26/19 Sacrum Mid Unstageable - Full thickness tissue loss in which the base of the injury is covered by slough (yellow, tan, gray, green or brown) and/or eschar (tan, brown or black) in the wound bed. (Active)  Wound Image   01/08/20 1400  Dressing Type ABD;Gauze (Comment);Moist to moist;Barrier Film (skin prep) 01/09/20 1043  Dressing Changed;Clean;Dry;Intact 01/09/20 1043  Dressing Change Frequency Daily 01/09/20 1043  State of Healing Non-healing 01/09/20 1043  Site / Wound Assessment Yellow;Dusky;Brown;Friable 01/09/20 1043  % Wound base Yellow/Fibrinous Exudate 100% 01/09/20 1043  Peri-wound Assessment Erythema (blanchable);Maceration 01/09/20 1043  Wound Length (cm) 7.5 cm 01/09/20 1043  Wound Width (cm) 3.8 cm 01/09/20 1043  Wound Depth (cm) 2.8 cm 01/09/20 1043  Wound Surface Area (cm^2) 28.5 cm^2 01/09/20 1043  Wound Volume (cm^3) 79.8 cm^3 01/09/20 1043  Margins Unattached edges (unapproximated) 01/09/20 1043  Drainage Amount Minimal 01/09/20 1043  Drainage Description Serous 01/09/20 1043  Treatment Debridement (Selective);Hydrotherapy (Pulse lavage);Packing (Saline gauze) 01/09/20 1043   Santyl applied to wound bed prior to applying dressing.   Hydrotherapy Pulsed lavage therapy - wound location: sacrum Pulsed Lavage with Suction (psi): 12 psi Pulsed Lavage with Suction - Normal Saline Used: 1000 mL Pulsed Lavage Tip: Tip with splash shield Selective Debridement Selective Debridement - Location: sacrum Selective Debridement - Tools  Used: Forceps;Scissors;Scalpel Selective Debridement - Tissue Removed: yellow slough material    Wound Assessment and Plan  Wound Therapy - Assess/Plan/Recommendations Wound Therapy - Clinical Statement: Debrided minimal yellow slough. Once wound bed is cleaned up, hopeful for placement of wound vac.Pt will benefit from pulsed lavage and selective debridement to decrease bioburden and improve wound healing.  Wound Therapy - Functional Problem List: continues to need mechanical ventilation at night Factors Delaying/Impairing Wound Healing: Multiple medical problems;Immobility Hydrotherapy Plan: Debridement;Dressing change;Patient/family education;Pulsatile lavage with suction Wound Therapy - Frequency: 6X / week Wound Therapy - Follow Up Recommendations: [place] Wound Plan: see above  Wound Therapy Goals- Improve the function of patient's integumentary system by progressing the wound(s) through the phases of wound healing (inflammation - proliferation - remodeling) by: Decrease Necrotic Tissue to: 75 Decrease Necrotic Tissue - Progress: Progressing toward goal Increase Granulation Tissue to: 25 Increase Granulation Tissue - Progress: Progressing toward goal Goals/treatment plan/discharge plan were made with and agreed upon by patient/family: Yes Time For Goal Achievement: 7 days Wound Therapy - Potential for Goals: Excellent  Goals will be updated until maximal potential achieved or discharge criteria met.  Discharge criteria: when goals achieved, discharge from hospital, MD decision/surgical intervention, no progress towards goals, refusal/missing three consecutive treatments without notification or medical reason.  GP      Wyona Almas, PT, DPT Acute Rehabilitation Services Pager [phone] Office [phone]   Deno Etienne 01/09/2020, 10:47 AM

## 2020-01-09 NOTE — Progress Notes (Signed)
Found pt still on ATC and pt laying on stomach/side.  I asked pt to roll over so that he could be placed on vent.  Per pt, he doesn't want to be on vent currently.  VSS now, sat 100% and no distress noted.

## 2020-01-10 LAB — GLUCOSE, CAPILLARY
Glucose-Capillary: 130 mg/dL — ABNORMAL HIGH (ref 70–99)
Glucose-Capillary: 125 mg/dL — ABNORMAL HIGH (ref 70–99)
Glucose-Capillary: 138 mg/dL — ABNORMAL HIGH (ref 70–99)
Glucose-Capillary: 118 mg/dL — ABNORMAL HIGH (ref 70–99)
Glucose-Capillary: 123 mg/dL — ABNORMAL HIGH (ref 70–99)

## 2020-01-10 MED ORDER — GABAPENTIN 300 MG PO CAPS
300.0000 mg | ORAL_CAPSULE | Freq: Two times a day (BID) | ORAL | Status: DC
  Administered 2020-01-10 – 2020-01-13 (×6): 300 mg via ORAL
  Filled 2020-01-10 (×6): qty 1

## 2020-01-10 MED ORDER — IBUPROFEN 400 MG PO TABS
400.0000 mg | ORAL_TABLET | Freq: Four times a day (QID) | ORAL | Status: DC | PRN
  Administered 2020-01-10: 400 mg via ORAL
  Filled 2020-01-10: qty 1

## 2020-01-10 NOTE — Progress Notes (Signed)
Pt has 1 min run of V-tach. Hemodynamically stable. Unsure is some was artifact. EICU notified. Also spoke with EICU about pts bil feet pain. Ibuprofen was ordered earlier and has not been effective. Gabapentin and EKG ordered at this time via EICU.

## 2020-01-10 NOTE — Progress Notes (Signed)
NAME:  Clifford Nichols, MRN:  [id number], DOB:  1980-09-11, LOS: 102 [facility] ADMISSION DATE:  12/04/2019, [facility] Admission DATE:  12/04/19 REFERRING MD:  Dr Mortimer Fries, CCM, CHIEF COMPLAINT:  Acute respiratory failure   Brief History   39 year old obese man with hypertension, diabetes admitted with acute respiratory failure, ARDS from COVID-19 pneumonia, initial positive test 5/10  Past Medical History    has a past medical history of Abscess of upper back excluding scapular region, Diabetes mellitus without complication ([place]), GERD (gastroesophageal reflux disease), Hypertension, and Sebaceous cyst.  Significant Hospital Events   5/10 admitted [facility] [place]  5/11 failed HHFNC, BiPAP.  Required intubation 5/12 transferred to [facility], evaluated by opthal for proptosis- no intervention required 5/17 Started paralytics, ECMO team consulted Not a candidate 5/19 Started proning 5/23 Off pressors 5/26 Proning stopped, continue paralytics 5/27 New tongue wound 2/2 bite block in place, weaned off ketamine. Daily fevers 5/28 Trach, stop paralytics 6/1 Out of airborne isolation, family visiting, on PSV weans 6/6 Tolerated ATC yesterday for 12 hrs 6/7 remained on ATC 6/11 remains on ATC  Consults:  Ophthalmology for proptosis 5/12  Procedures:  ETT 5/11 >> 5/28 L femoral CVC 5/11 >> 5/17 R radial art line 5/17 >> 5/28 L IJ CVC 5/17 >> 6/1  Trach 5/28  Significant Diagnostic Tests:  Head CT 5/11 >> no significant intracranial abnormality.  Scattered opacification and air-fluid levels in the frontal, ethmoid, sphenoid and maxillary sinuses of unclear significance.  Head CT 5/12 >> no significant intracranial abnormality  Lower extremity Doppler 5/14 >> negative for DVT  Micro Data:  SARS CoV2 5/10 >> positive MRSA screen 5/12 >> negative Respiratory 5/12 >> negative Blood 5/17 >> Staph epi and Staph hominis Respiratory 5/17 >> Staph epi 5/24 resp cx bal: staph epi 5/24 BAL acid fast: smear negative cx  pending 5/24 BAL fungal:pending 5/26 blood: No growth BAL 5/28-Acinetobacter, Proteus, Pseudomonas, Enterococcus Trach asp 6/1- Pseudomonas  Antimicrobials:  Remdesivir 5/11 >> 5/15 Dexamethasone 5/11 >> 5/20 Tocilizumab 5/12 Cefepime 5/17 >> 5/21 Linezolid 5/17 >> 5/23 Zosyn 5/21 >>  5/29 Vanco 5/28 >> 6/1  Interim history/subjective:  Slept without CPAP for two nights consecutively. Complains of plantar pain with walking.    Objective   Blood pressure 115/60, pulse (!) 117, temperature 98.1 F (36.7 C), temperature source Axillary, resp. rate 17, height _0  (1.702 m), weight (!) 158.5 kg, SpO2 96 %.    FiO2 (%):  [28 %-35 %] 28 %   Intake/Output Summary (Last 24 hours) at 01/10/2020 1126 Last data filed at 01/10/2020 0400 Gross per 24 hour  Intake --  Output 1000 ml  Net -1000 ml   Filed Weights   01/04/20 0500 01/07/20 0600 01/10/20 0417  Weight: (!) 159.5 kg (!) 158.5 kg (!) 158.5 kg    Examination: General:  Morbidly obese [place] adult male resting comfortably in bed.  HEENT: Trach in place, on ATM Neuro: Awake, alert, phonates around trach.  CV: Tachycardic, regular, ext warm PULM:  Non labored, decreased air movement bilaterally, no added sounds Abdomen: Nontender MSK: tenderness to plantar fascia and with foot dorsiflexion.    Resolved Hospital Problem list    Left proptosis, noted with coughing early 5/12, resolved.  Apparently per wife he has episodic intermittent proptosis at home as well. Head CT 5/12 reassuring Ophthalmology did evaluate, recommended that he will need outpatient evaluation but no intervention at this time beyond keeping the eyes moist.  Septic shock, present on admission. Tongue necrosis secondary to  biting down Hypertriglyceridemia Acinetobacter HAP Acute toxic metabolic encephalopathy    Assessment & Plan:   Severe ARDS due to COVID-19 pneumonia  S/p trach  Morbid obesity, possible OSA Multiple pneumonias in the  past Deconditioning DM 2 Stage III pressure ulcer Insomnia  PLAN:  Nocturnal apnea and desaturation have resolved with correction of contraction alkalosis and reduction in sedative medications.  Foot pain consistent with plantar fasciitis - will improve with mobilization.   Ready for transfer to Floor pending reassessment by CIR. Medication reconciliation completed and hospitalist informed.    Daily Goals Checklist  Pain/Anxiety/Delirium protocol (if indicated): prn only. Tapering clonazepam and seroquel off VAP protocol (if indicated): not intubated. Respiratory support goals: trach collar with PMV. As patient is more mobile, will look at starting capping trials Blood pressure target: MAP>65 - allow autoregulation.  DVT prophylaxis: Lovenox Nutrition Status: Cortrak removed and patient eating full diet.  GI prophylaxis: not indicated.  Fluid status goals: Euvolvemic.  Urinary catheter: Voiding spontaneously.  Central lines: PIV only Glucose control: DM type 2 euglycemic on Detemir and meal coverage.  Mobility/therapy needs: PT- mobilize independently Antibiotic de-escalation: none Home medication reconciliation: none Daily labs: as needed Code Status: full Family Communication: Patient updated Disposition: To floor pending CIR.  35 min spent with >50% in counseling and coordination of care.   Kipp Brood, MD [facility] ICU Physician [place]  Pager: [phone] Mobile: [phone] After hours: [phone].  01/10/2020, 11:26 AM

## 2020-01-10 NOTE — Progress Notes (Signed)
AC has approved pts Dad to come visit on 12pm-2pm on 01/10/20. Other than tomorrow Mother and Wife are the 2 designated visitors.

## 2020-01-10 NOTE — Progress Notes (Signed)
Patient stated that he does not want to wear CPAP for the evening. Vitals are stable, no distress noted. RT will continue to monitor.

## 2020-01-10 NOTE — Progress Notes (Signed)
Inpatient Rehabilitation-Admissions Coordinator   Still awaiting an insurance determination. Anticipate I will not hear until Monday. Discussed with pt and he verbalized understanding as well as a continued interest in the program. Will follow up with pt Monday for possible admit, pending bed availability, insurance authorization, and medical stability.   Cheri Rous, OTR/L  Rehab Admissions Coordinator  [phone] 01/10/2020 12:11 PM

## 2020-01-10 NOTE — Progress Notes (Signed)
Physical Therapy Treatment Patient Details Name: Clifford Nichols MRN: [id number] DOB: 1981/05/10 Today's Date: 01/10/2020    History of Present Illness 39 year old obese man with hypertension, diabetes, admitted 12/02/19 to [facility] with acute respiratory failure, ARDS from COVID-19 pneumonia, initial positive test 5/10. Intubated 5/11, required paralytics, proning, pressors, tracheostomy 5/28; Head CT 5/12 no abnormality. Seen by opthalmology due to left eye proptosis (no intervention recommended). Tongue necrosis due to pt biting down when orally intubated    PT Comments    Pt continues to improve toward goals.  Today revisited putting on shoes to ambulate.  Pt had a slightly more difficult time consistently advancing his feet due to weak df from ankle "everters" and rubber on the toes of his shoes rubbing on the floor.   Follow Up Recommendations  CIR     Equipment Recommendations   (TBA)    Recommendations for Other Services       Precautions / Restrictions Precautions Precautions: Fall Precaution Comments: watch RR, HR, sats Restrictions Weight Bearing Restrictions: No    Mobility  Bed Mobility               General bed mobility comments: sitting EOB on arrival  Transfers Overall transfer level: Needs assistance Equipment used: Rolling walker (2 wheeled) Transfers: Sit to/from Stand Sit to Stand: Min assist         General transfer comment: needs stability assist due to his limited ankle/foot function.  Ambulation/Gait Ambulation/Gait assistance: Min assist;Mod assist;+2 safety/equipment Gait Distance (Feet): 40 Feet (70 feet and 50 feet) Assistive device: Rolling walker (2 wheeled) Gait Pattern/deviations: Step-through pattern;Decreased stride length;Decreased dorsiflexion - right;Decreased dorsiflexion - left Gait velocity: slower   General Gait Details: more stability assist needed due to pt with shoes on today making his feet heavier, use of everters to  df are weak and drag with swing through and then mildly more unsteady contact.   Stairs             Wheelchair Mobility    Modified Rankin (Stroke Patients Only)       Balance   Sitting-balance support: Bilateral upper extremity supported;No upper extremity supported;Feet supported Sitting balance-Leahy Scale: Fair     Standing balance support: Bilateral upper extremity supported;During functional activity Standing balance-Leahy Scale: Poor Standing balance comment: relies on bil UE on RW.  There is little to very weak ankle and foot function.                            Cognition Arousal/Alertness: Awake/alert Behavior During Therapy: WFL for tasks assessed/performed Overall Cognitive Status: Within Functional Limits for tasks assessed                                        Exercises      General Comments General comments (skin integrity, edema, etc.): sats on RA with PMV  95% or greater      Pertinent Vitals/Pain Pain Assessment: 0-10 Pain Score: 6  (to 10) Faces Pain Scale: Hurts a little bit Pain Location: feet, then sacral Pain Descriptors / Indicators: Discomfort;Pins and needles;Burning Pain Intervention(s): Monitored during session;Premedicated before session    Home Living                      Prior Function  PT Goals (current goals can now be found in the care plan section) Acute Rehab PT Goals Patient Stated Goal: get stronger, get to go to rehab PT Goal Formulation: With patient Time For Goal Achievement: 01/15/20 Potential to Achieve Goals: Good    Frequency    Min 4X/week      PT Plan Current plan remains appropriate    Co-evaluation              AM-PAC PT "6 Clicks" Mobility   Outcome Measure  Help needed turning from your back to your side while in a flat bed without using bedrails?: A Little Help needed moving from lying on your back to sitting on the side of a flat bed  without using bedrails?: A Little Help needed moving to and from a bed to a chair (including a wheelchair)?: A Little Help needed standing up from a chair using your arms (e.g., wheelchair or bedside chair)?: A Little Help needed to walk in hospital room?: A Lot Help needed climbing 3-5 steps with a railing? : Total 6 Click Score: 15    End of Session   Activity Tolerance: Patient tolerated treatment well Patient left: with call bell/phone within reach;Other (comment);with family/visitor present Nurse Communication: Mobility status PT Visit Diagnosis: Unsteadiness on feet (R26.81);Other abnormalities of gait and mobility (R26.89);Muscle weakness (generalized) (M62.81);Difficulty in walking, not elsewhere classified (R26.2)     Time: 9924-2683 PT Time Calculation (min) (ACUTE ONLY): 41 min  Charges:  $Gait Training: 23-37 mins $Therapeutic Activity: 8-22 mins                     01/10/2020  Clifford Carne., PT Acute Rehabilitation Services [phone]  (pager) [phone]  (office)   Clifford Nichols 01/10/2020, 4:48 PM

## 2020-01-10 NOTE — Progress Notes (Signed)
Physical Therapy Wound Treatment Patient Details  Name: NIC LAMPE MRN: [id number] Date of Birth: 1981-03-13  Today's Date: 01/10/2020 Time: 1040-1101 Time Calculation (min): 21 min  Subjective  Subjective: Pt agreeable to hydrotherapy, able to position self Patient and Family Stated Goals: get back to his family Prior Treatments: dressing change, santyl  Pain Score:  6/10  Wound Assessment  Pressure Injury 12/26/19 Sacrum Mid Unstageable - Full thickness tissue loss in which the base of the injury is covered by slough (yellow, tan, gray, green or brown) and/or eschar (tan, brown or black) in the wound bed. (Active)  Wound Image   01/08/20 1400  Dressing Type ABD;Barrier Film (skin prep);Gauze (Comment) 01/10/20 1316  Dressing Changed;Clean;Dry;Intact 01/10/20 1316  Dressing Change Frequency Daily 01/10/20 1316  State of Healing Non-healing 01/10/20 1316  Site / Wound Assessment Yellow;Dusky;Brown;Friable 01/09/20 1043  % Wound base Yellow/Fibrinous Exudate 100% 01/10/20 1316  Peri-wound Assessment Erythema (blanchable);Maceration 01/10/20 1316  Wound Length (cm) 7.5 cm 01/09/20 1043  Wound Width (cm) 3.8 cm 01/09/20 1043  Wound Depth (cm) 2.8 cm 01/09/20 1043  Wound Surface Area (cm^2) 28.5 cm^2 01/09/20 1043  Wound Volume (cm^3) 79.8 cm^3 01/09/20 1043  Margins Unattached edges (unapproximated) 01/10/20 1316  Drainage Amount Minimal 01/10/20 1316  Drainage Description Serous 01/10/20 1316  Treatment Debridement (Selective);Hydrotherapy (Pulse lavage);Packing (Saline gauze) 01/10/20 1316   Santyl applied to wound bed prior to applying dressing.    Hydrotherapy Pulsed lavage therapy - wound location: sacrum Pulsed Lavage with Suction (psi): 12 psi Pulsed Lavage with Suction - Normal Saline Used: 1000 mL Pulsed Lavage Tip: Tip with splash shield Selective Debridement Selective Debridement - Location: sacrum Selective Debridement - Tools Used: Forceps;Scalpel Selective  Debridement - Tissue Removed: yellow slough material    Wound Assessment and Plan  Wound Therapy - Assess/Plan/Recommendations Wound Therapy - Clinical Statement: Debrided minimal yellow slough. Once wound bed is cleaned up, hopeful for placement of wound vac.Pt will benefit from pulsed lavage and selective debridement to decrease bioburden and improve wound healing.  Wound Therapy - Functional Problem List: continues to need mechanical ventilation at night Factors Delaying/Impairing Wound Healing: Multiple medical problems;Immobility Hydrotherapy Plan: Debridement;Dressing change;Patient/family education;Pulsatile lavage with suction Wound Therapy - Frequency: 6X / week Wound Therapy - Follow Up Recommendations: [place] Wound Plan: see above  Wound Therapy Goals- Improve the function of patient's integumentary system by progressing the wound(s) through the phases of wound healing (inflammation - proliferation - remodeling) by: Decrease Necrotic Tissue to: 75 Decrease Necrotic Tissue - Progress: Progressing toward goal Increase Granulation Tissue to: 25 Increase Granulation Tissue - Progress: Progressing toward goal Goals/treatment plan/discharge plan were made with and agreed upon by patient/family: Yes Time For Goal Achievement: 7 days Wound Therapy - Potential for Goals: Excellent  Goals will be updated until maximal potential achieved or discharge criteria met.  Discharge criteria: when goals achieved, discharge from hospital, MD decision/surgical intervention, no progress towards goals, refusal/missing three consecutive treatments without notification or medical reason.  GP      Wyona Almas, PT, DPT Acute Rehabilitation Services Pager [phone] Office [phone]   Deno Etienne 01/10/2020, 1:18 PM

## 2020-01-10 NOTE — Progress Notes (Addendum)
NAME:  Clifford Nichols, MRN:  [id number], DOB:  [birth year], LOS: 70 [facility] ADMISSION DATE:  12/04/2019, [facility] Admission DATE:  12/04/19 REFERRING MD:  Dr Mortimer Fries, CCM, CHIEF COMPLAINT:  Acute respiratory failure   Brief History   39 year old obese man with hypertension, diabetes admitted with acute respiratory failure, ARDS from COVID-19 pneumonia, initial positive test 5/10  Past Medical History    has a past medical history of Abscess of upper back excluding scapular region, Diabetes mellitus without complication ([place]), GERD (gastroesophageal reflux disease), Hypertension, and Sebaceous cyst.  Significant Hospital Events   5/10 admitted [facility]  5/11 failed HHFNC, BiPAP.  Required intubation 5/12 transferred to [facility], evaluated by opthal for proptosis- no intervention required 5/17 Started paralytics, ECMO team consulted Not a candidate 5/19 Started proning 5/23 Off pressors 5/26 Proning stopped, continue paralytics 5/27 New tongue wound 2/2 bite block in place, weaned off ketamine. Daily fevers 5/28 Trach, stop paralytics 6/1 Out of airborne isolation, family visiting, on PSV weans 6/6 Tolerated ATC yesterday for 12 hrs 6/7 remained on ATC 6/11 remains on ATC  Consults:  Ophthalmology for proptosis 5/12  Procedures:  ETT 5/11 >> 5/28 L femoral CVC 5/11 >> 5/17 R radial art line 5/17 >> 5/28 L IJ CVC 5/17 >> 6/1  Trach 5/28  Significant Diagnostic Tests:  Head CT 5/11 >> no significant intracranial abnormality.  Scattered opacification and air-fluid levels in the frontal, ethmoid, sphenoid and maxillary sinuses of unclear significance.  Head CT 5/12 >> no significant intracranial abnormality  Lower extremity Doppler 5/14 >> negative for DVT  Micro Data:  SARS CoV2 5/10 >> positive MRSA screen 5/12 >> negative Respiratory 5/12 >> negative Blood 5/17 >> Staph epi and Staph hominis Respiratory 5/17 >> Staph epi 5/24 resp cx bal: staph epi 5/24 BAL acid fast: smear negative cx  pending 5/24 BAL fungal:pending 5/26 blood: No growth BAL 5/28-Acinetobacter, Proteus, Pseudomonas, Enterococcus Trach asp 6/1- Pseudomonas  Antimicrobials:  Remdesivir 5/11 >> 5/15 Dexamethasone 5/11 >> 5/20 Tocilizumab 5/12 Cefepime 5/17 >> 5/21 Linezolid 5/17 >> 5/23 Zosyn 5/21 >>  5/29 Vanco 5/28 >> 6/1  Interim history/subjective:  Slept without CPAP   Objective   Blood pressure (!) 109/96, pulse (!) 102, temperature 98 F (36.7 C), temperature source Oral, resp. rate (!) 24, height _0  (1.702 m), weight (!) 158.5 kg, SpO2 96 %.    FiO2 (%):  [28 %-35 %] 28 %   Intake/Output Summary (Last 24 hours) at 01/10/2020 0748 Last data filed at 01/10/2020 0400 Gross per 24 hour  Intake --  Output 1400 ml  Net -1400 ml   Filed Weights   01/04/20 0500 01/07/20 0600 01/10/20 0417  Weight: (!) 159.5 kg (!) 158.5 kg (!) 158.5 kg    Examination: General:  Morbidly obese [place] adult male resting comfortably in bed.  HEENT: Trach in place, on ATM Neuro: Awake, alert, phonates around trach.  CV: Tachycardic, regular, ext warm PULM:  Non labored, decreased air movement bilaterally, no added sounds Abdomen: Nontender   Resolved Hospital Problem list    Left proptosis, noted with coughing early 5/12, resolved.  Apparently per wife he has episodic intermittent proptosis at home as well. Head CT 5/12 reassuring Ophthalmology did evaluate, recommended that he will need outpatient evaluation but no intervention at this time beyond keeping the eyes moist.  Septic shock, present on admission. Tongue necrosis secondary to biting down Hypertriglyceridemia Acinetobacter HAP Acute toxic metabolic encephalopathy    Assessment & Plan:   Severe ARDS  due to COVID-19 pneumonia  S/p trach  Morbid obesity, possible OSA Multiple pneumonias in the past Deconditioning  Still having nocturnal desaturations despite change to #6 proximal XLT trach 6/12. Trach switched to cuffed trach to  allow for positive pressure ventilation at night.  Now off nocturnal CPAP since alkalosis corrected.  AKI: Cr 1.31, BUN 50 - BUN/Cr stable today.  - Continue lasix 74m daily assuming renal function continues to tolerate.   DM2 -Levemir  -SSI  Stage III pressure ulcer - Unstageable pressure injury  - WOCN following, appreciate input  - Hydrotherapy Q Mon-Sat by PT, continue santyl for enzymatic debridement - Continue to monitor    Insomnia -Ambien 5 mg nightly  Dispo: - Ready for transfer to floor  RKipp Brood MD FEst[facility] CenterICU Physician [place] Pager: [phone]: [phone] hours: [phone].  01/10/2020, 7:50 AM

## 2020-01-10 NOTE — Progress Notes (Signed)
Pt arrived to unit. Reports nerve pain, MD made aware. Ibuprofen ordered. Pt in no resp distress. RT contacted for trach collar equipment. No other needs at this time.

## 2020-01-11 LAB — GLUCOSE, CAPILLARY
Glucose-Capillary: 107 mg/dL — ABNORMAL HIGH (ref 70–99)
Glucose-Capillary: 115 mg/dL — ABNORMAL HIGH (ref 70–99)
Glucose-Capillary: 118 mg/dL — ABNORMAL HIGH (ref 70–99)
Glucose-Capillary: 119 mg/dL — ABNORMAL HIGH (ref 70–99)
Glucose-Capillary: 128 mg/dL — ABNORMAL HIGH (ref 70–99)
Glucose-Capillary: 138 mg/dL — ABNORMAL HIGH (ref 70–99)

## 2020-01-11 LAB — CBC
HCT: 33.9 % — ABNORMAL LOW (ref 39.0–52.0)
Hemoglobin: 10.1 g/dL — ABNORMAL LOW (ref 13.0–17.0)
MCH: 29 pg (ref 26.0–34.0)
MCHC: 29.8 g/dL — ABNORMAL LOW (ref 30.0–36.0)
MCV: 97.4 fL (ref 80.0–100.0)
Platelets: 209 10*3/uL (ref 150–400)
RBC: 3.48 MIL/uL — ABNORMAL LOW (ref 4.22–5.81)
RDW: 14.6 % (ref 11.5–15.5)
WBC: 7.4 10*3/uL (ref 4.0–10.5)
nRBC: 0 % (ref 0.0–0.2)

## 2020-01-11 MED ORDER — ZINC SULFATE 220 (50 ZN) MG PO CAPS
220.0000 mg | ORAL_CAPSULE | Freq: Every day | ORAL | Status: DC
  Administered 2020-01-12 – 2020-01-13 (×2): 220 mg via ORAL
  Filled 2020-01-11 (×2): qty 1

## 2020-01-11 MED ORDER — OXYCODONE HCL 5 MG PO TABS
10.0000 mg | ORAL_TABLET | ORAL | Status: DC
  Administered 2020-01-11 – 2020-01-13 (×11): 10 mg via ORAL
  Filled 2020-01-11 (×11): qty 2

## 2020-01-11 MED ORDER — ASCORBIC ACID 500 MG PO TABS
500.0000 mg | ORAL_TABLET | Freq: Every day | ORAL | Status: DC
  Administered 2020-01-12 – 2020-01-13 (×2): 500 mg via ORAL
  Filled 2020-01-11 (×2): qty 1

## 2020-01-11 MED ORDER — POTASSIUM CHLORIDE CRYS ER 20 MEQ PO TBCR: 20.0000 meq | EXTENDED_RELEASE_TABLET | Freq: Every day | ORAL | Status: DC

## 2020-01-11 MED ORDER — ENOXAPARIN SODIUM 80 MG/0.8ML ~~LOC~~ SOLN
80.0000 mg | Freq: Two times a day (BID) | SUBCUTANEOUS | Status: DC
  Administered 2020-01-11 – 2020-01-13 (×4): 80 mg via SUBCUTANEOUS
  Filled 2020-01-11 (×4): qty 0.8

## 2020-01-11 MED ORDER — INSULIN ASPART 100 UNIT/ML ~~LOC~~ SOLN: 0.0000 [IU] | Freq: Three times a day (TID) | SUBCUTANEOUS | Status: DC

## 2020-01-11 NOTE — Progress Notes (Signed)
Physical Therapy Wound Treatment Patient Details  Name: Clifford Nichols MRN: [id number] Date of Birth: 12/03/1980  Today's Date: 01/11/2020 Time: 1023-1050 Time Calculation (min): 27 min  Subjective  Subjective: Pt agreeable to hydrotherapy, getting washed up prior to treatment Patient and Family Stated Goals: get back to his family Prior Treatments: dressing change, santyl  Pain Score: 4/10  Wound Assessment  Pressure Injury 12/26/19 Sacrum Mid Unstageable - Full thickness tissue loss in which the base of the injury is covered by slough (yellow, tan, gray, green or brown) and/or eschar (tan, brown or black) in the wound bed. (Active)  Dressing Type ABD;Barrier Film (skin prep);Gauze (Comment) 01/11/20 1056  Dressing Changed;Clean;Dry;Intact 01/11/20 1056  Dressing Change Frequency Daily 01/11/20 1056  State of Healing Early/partial granulation 01/11/20 1056  Site / Wound Assessment Pink;Red;Yellow;Pale 01/11/20 1056  % Wound base Red or Granulating 20% 01/11/20 1056  % Wound base Yellow/Fibrinous Exudate 80% 01/11/20 1056  % Wound base Black/Eschar 0% 01/11/20 1056  % Wound base Other/Granulation Tissue (Comment) 0% 01/11/20 1056  Peri-wound Assessment Erythema (blanchable);Maceration 01/11/20 1056  Wound Length (cm) 7.5 cm 01/09/20 1043  Wound Width (cm) 3.8 cm 01/09/20 1043  Wound Depth (cm) 2.8 cm 01/09/20 1043  Wound Surface Area (cm^2) 28.5 cm^2 01/09/20 1043  Wound Volume (cm^3) 79.8 cm^3 01/09/20 1043  Margins Unattached edges (unapproximated) 01/11/20 1056  Drainage Amount Minimal 01/11/20 1056  Drainage Description Serosanguineous 01/11/20 1056  Treatment Debridement (Selective);Hydrotherapy (Pulse lavage);Packing (Saline gauze) 01/11/20 1056   Santyl applied to wound bed prior to applying dressing.    Hydrotherapy Pulsed lavage therapy - wound location: sacrum Pulsed Lavage with Suction (psi): 12 psi Pulsed Lavage with Suction - Normal Saline Used: 1000 mL Pulsed  Lavage Tip: Tip with splash shield Selective Debridement Selective Debridement - Location: sacrum Selective Debridement - Tools Used: Scalpel Selective Debridement - Tissue Removed: cross hatched yellow adherent tissue on proximal portion of wound   Wound Assessment and Plan  Wound Therapy - Assess/Plan/Recommendations Wound Therapy - Clinical Statement: Pt with increased sanguineous drainage with dressing removal. Early/partial granulation noted. Once wound is cleaned up, hopeful for placement of wound vac.Pt will benefit from pulsed lavage and selective debridement to decrease bioburden and improve wound healing.  Wound Therapy - Functional Problem List: continues to need mechanical ventilation at night Factors Delaying/Impairing Wound Healing: Multiple medical problems;Immobility Hydrotherapy Plan: Debridement;Dressing change;Patient/family education;Pulsatile lavage with suction Wound Therapy - Frequency: 6X / week Wound Therapy - Follow Up Recommendations: [facility] Wound Plan: see above  Wound Therapy Goals- Improve the function of patient's integumentary system by progressing the wound(s) through the phases of wound healing (inflammation - proliferation - remodeling) by: Decrease Necrotic Tissue to: 75 Decrease Necrotic Tissue - Progress: Progressing toward goal Increase Granulation Tissue to: 25 Increase Granulation Tissue - Progress: Progressing toward goal Goals/treatment plan/discharge plan were made with and agreed upon by patient/family: Yes Time For Goal Achievement: 7 days Wound Therapy - Potential for Goals: Excellent  Goals will be updated until maximal potential achieved or discharge criteria met.  Discharge criteria: when goals achieved, discharge from hospital, MD decision/surgical intervention, no progress towards goals, refusal/missing three consecutive treatments without notification or medical reason.  GP     Brown, PT, DPT Acute Rehabilitation  Services Pager [phone] Office [phone]      Carloine T Brown 01/11/2020, 10:59 AM   

## 2020-01-11 NOTE — Progress Notes (Signed)
PROGRESS NOTE                                                                                                                                                                                                             Patient Demographics:    Clifford Nichols, is a 39 y.o. male, DOB - 28-Jul-1980, VCB:[id number]  Outpatient Primary MD for the [facility] is [facility] [place]    LOS - 51  Admit date - 12/04/2019    No chief complaint on file.      Brief Narrative  - 39 year old morbidly obese African-American gentleman with history of hypertension, DM type II, admitted to the hospital with severe shortness of breath diagnosed with severe acute hypoxic respiratory failure due to COVID-19 pneumonia and ARDS, required intubation and tracheostomy, was stabilized, placed on trach collar and transferred to my care on 01/11/2020 on day 38 of his hospital stay.  Significant events   5/10 admitted [facility]  5/11 failed HHFNC, BiPAP.  Required intubation 5/12 transferred to [facility], evaluated by opthal for proptosis- no intervention required 5/17 Started paralytics, ECMO team consulted Not a candidate 5/19 Started proning 5/23 Off pressors 5/26 Proning stopped, continue paralytics 5/27 New tongue wound 2/2 bite block in place, weaned off ketamine. Daily fevers 5/28 Trach, stop paralytics 6/1 Out of airborne isolation, family visiting, on PSV weans 6/6 Tolerated ATC yesterday for 12 hrs 6/7 remained on ATC 6/11 remains on ATC  Procedures ETT 5/11 >> 5/28 L femoral CVC 5/11 >> 5/17 R radial art line 5/17 >> 5/28 L IJ CVC 5/17 >> 6/1    Subjective:    Clifford Nichols today has, No headache, No chest pain, No abdominal pain - No Nausea, No new weakness tingling or numbness, no  Cough - SOB.    Assessment  & Plan :      1. Acute Hypoxic Resp. Failure due to Acute Covid 19 Viral Pneumonitis initially presented with severe  sepsis and ARDS -   He had severe disease, required intubation and eventual tracheostomy.  Received treatment under the care of pulmonary critical care, has been stabilized on trach collar, he has finished his COVID-19 treatment under pulmonary critical care and has been transferred to hospitalist service on 01/11/2020 on day 38 of his hospital stay.  Currently stable on trach collar, pulmonary critical care  monitoring and following him for that.  Hopefully he can get decannulated soon.  Encouraged the patient to sit up in chair in the daytime use I-S for pulmonary toiletry.  Will advance activity and titrate down oxygen as possible.    SpO2: 99 % O2 Flow Rate (L/min): 5 L/min FiO2 (%): 28 %  No results for input(s): CRP, DDIMER, BNP, PROCALCITON, SARSCOV2NAA in the last 168 hours.  Invalid input(s): LACTICACID  Hepatic Function Latest Ref Rng & Units 12/23/2019 12/22/2019 12/21/2019  Total Protein 6.5 - 8.1 g/dL 6.4(L) 6.0(L) 5.5(L)  Albumin 3.5 - 5.0 g/dL 2.0(L) 2.1(L) 2.1(L)  AST 15 - 41 U/L 100(H) 107(H) 130(H)  ALT 0 - 44 U/L 104(H) 117(H) 118(H)  Alk Phosphatase 38 - 126 U/L 52 45 37(L)  Total Bilirubin 0.3 - 1.2 mg/dL 0.9 0.7 1.1      2.  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  Chronic problem needs outpatient ophthalmology follow-up post discharge.  3.  Septic shock with bacterial infection.  Resolved, treated under critical care service.  Currently not on antibiotics.  4.  Morbid obesity with BMI of 54.  Follow with PCP for weight loss.  5.  Mild to moderate elevation of D-dimer.  In the setting of morbid obesity, moderate dose Lovenox and monitor.  6.  Tongue bite with necrosis.  Resolved after supportive care under the care of critical care.  No active issue.  7.  Acute toxic and metabolic encephalopathy.  Resolved.  8.  Plantar fasciitis.  Supportive care.  9.  DM type II.  On Levemir and scale.  Monitor and adjust.  Lab Results  Component Value Date     HGBA1C 6.6 (H) 12/03/2019   CBG (last 3)  Recent Labs    01/11/20 0010 01/11/20 0440 01/11/20 0803  GLUCAP 118* 128* 138*    Condition - Fair  Family Communication  :  None  Code Status :  Full  Consults  :  Optho, PCCM  PUD Prophylaxis : None  Disposition Plan  :    Status is: Inpatient  Remains inpatient appropriate because:Inpatient level of care appropriate due to severity of illness   Dispo: The patient is from: Home              Anticipated d/c is to: SNF              Anticipated d/c date is: 3 days              Patient currently is not medically stable to d/c.   DVT Prophylaxis  :  Lovenox    Lab Results  Component Value Date   PLT 209 01/11/2020    Diet :  Diet Order            Diet regular Room service appropriate? Yes with Assist; Fluid consistency: Thin  Diet effective now                  Inpatient Medications  Scheduled Meds: . vitamin C  500 mg Per Tube Daily  . Chlorhexidine Gluconate Cloth  6 each Topical Daily  . collagenase   Topical Daily  . enoxaparin (LOVENOX) injection  0.5 mg/kg Subcutaneous Q12H  . gabapentin  300 mg Oral BID  . insulin aspart  0-20 Units Subcutaneous Q4H  . insulin detemir  5 Units Subcutaneous BID  . melatonin  3 mg Oral QHS  . oxyCODONE  10 mg Per Tube Q4H  . potassium chloride  20 mEq Per Tube Daily  . zinc sulfate  220 mg Per Tube Daily   Continuous Infusions: PRN Meds:.acetaminophen (TYLENOL) oral liquid 160 mg/5 mL, guaiFENesin, lip balm, zolpidem  Antibiotics  :    Anti-infectives (From admission, onward)   Start     Dose/Rate Route Frequency Ordered Stop   12/21/19 0900  vancomycin (VANCOREADY) IVPB 2000 mg/400 mL        2,000 mg 200 mL/hr over 120 Minutes Intravenous Every 24 hours 12/20/19 0845 12/24/19 1156   12/20/19 0900  vancomycin (VANCOCIN) 2,500 mg in sodium chloride 0.9 % 500 mL IVPB        2,500 mg 250 mL/hr over 120 Minutes Intravenous  Once 12/20/19 0845 12/20/19 1237    12/14/19 0000  piperacillin-tazobactam (ZOSYN) IVPB 3.375 g        3.375 g 12.5 mL/hr over 240 Minutes Intravenous Every 8 hours 12/13/19 1652 12/19/19 1953   12/13/19 1700  piperacillin-tazobactam (ZOSYN) IVPB 3.375 g        3.375 g 100 mL/hr over 30 Minutes Intravenous  Once 12/13/19 1651 12/13/19 1741   12/10/19 0100  ceFEPIme (MAXIPIME) 2 g in sodium chloride 0.9 % 100 mL IVPB  Status:  Discontinued        2 g 200 mL/hr over 30 Minutes Intravenous Every 8 hours 12/09/19 2044 12/12/19 0942   12/09/19 2200  ceftaroline (TEFLARO) 600 mg in sodium chloride 0.9 % 250 mL IVPB  Status:  Discontinued        600 mg 250 mL/hr over 60 Minutes Intravenous Every 12 hours 12/09/19 2023 12/09/19 2024   12/09/19 2200  ceftaroline (TEFLARO) 600 mg in sodium chloride 0.9 % 250 mL IVPB  Status:  Discontinued       Note to Pharmacy: Pharmacy may modify dose for renal failure.   600 mg 250 mL/hr over 60 Minutes Intravenous Every 12 hours 12/09/19 2024 12/09/19 2044   12/09/19 2130  linezolid (ZYVOX) IVPB 600 mg        600 mg 300 mL/hr over 60 Minutes Intravenous Every 12 hours 12/09/19 2044 12/15/19 2349   12/09/19 0900  ceFEPIme (MAXIPIME) 2 g in sodium chloride 0.9 % 100 mL IVPB  Status:  Discontinued        2 g 200 mL/hr over 30 Minutes Intravenous Every 8 hours 12/09/19 0836 12/09/19 2023   12/05/19 1000  remdesivir 100 mg in sodium chloride 0.9 % 100 mL IVPB        100 mg 200 mL/hr over 30 Minutes Intravenous Daily 12/04/19 1417 12/06/19 1003       Time Spent in minutes  30   Lala Lund M.D on 01/11/2020 at 10:47 AM  To page go to [email or web address] - password [facility]  [facility] -  Office  [phone]     See all Orders from today for further details    Objective:   Vitals:   01/11/20 0306 01/11/20 0440 01/11/20 0740 01/11/20 0800  BP:  (!) 141/78 (!) 90/22 110/80  Pulse: 100 98 (!) 105 (!) 103  Resp: (!) _0 Temp:  98.2 F (36.8 C) 98.8 F (37.1 C) 98.8 F (37.1  C)  TempSrc:  Oral Oral Oral  SpO2: 95% 100% 95% 99%  Weight:      Height:        Wt Readings from Last 3 Encounters:  01/10/20 (!) 158.5 kg  12/04/19 (!) 172 kg  07/26/18 (!) 178.8 kg     Intake/Output  Summary (Last 24 hours) at 01/11/2020 1047 Last data filed at 01/11/2020 1026 Gross per 24 hour  Intake 480 ml  Output 1400 ml  Net -920 ml     Physical Exam  Awake Alert, No new F.N deficits, Normal affect [place].AT,PERRAL  Supple Neck,No JVD, No cervical lymphadenopathy appriciated. Trach collar in place Symmetrical Chest wall movement, Good air movement bilaterally, CTAB RRR,No Gallops,Rubs or new Murmurs, No Parasternal Heave +ve B.Sounds, Abd Soft, No tenderness, No organomegaly appriciated, No rebound - guarding or rigidity. No Cyanosis, Clubbing or edema, No new Rash or bruise      Data Review:    CBC Recent Labs  Lab 01/08/20 0413 01/11/20 0708  WBC 8.1 7.4  HGB 9.5* 10.1*  HCT 31.6* 33.9*  PLT 189 209  MCV 99.7 97.4  MCH 30.0 29.0  MCHC 30.1 29.8*  RDW 14.8 14.6    Chemistries  Recent Labs  Lab 01/05/20 0553 01/06/20 0302 01/07/20 0241 01/08/20 0413 01/09/20 1151  NA 137 140 140 139 135  K 3.5 4.0 3.5 3.3* 3.6  CL 87* 89* 89* 88* 92*  CO2 34* 39* 38* 38* 31  GLUCOSE 145* 125* 142* 144* 157*  BUN 47* 44* 50* 50* 32*  CREATININE 1.19 1.19 1.37* 1.31* 1.10  CALCIUM 9.2 9.5 9.4 9.3 9.5  MG 2.2 2.1 2.1 2.1  --      ------------------------------------------------------------------------------------------------------------------ No results for input(s): CHOL, HDL, LDLCALC, TRIG, CHOLHDL, LDLDIRECT in the last 72 hours.  Lab Results  Component Value Date   HGBA1C 6.6 (H) 12/03/2019   ------------------------------------------------------------------------------------------------------------------ No results for input(s): TSH, T4TOTAL, T3FREE, THYROIDAB in the last 72 hours.  Invalid input(s): FREET3  Cardiac Enzymes No results for  input(s): CKMB, TROPONINI, MYOGLOBIN in the last 168 hours.  Invalid input(s): CK ------------------------------------------------------------------------------------------------------------------    Component Value Date/Time   BNP 35.0 12/02/2019 1537    Micro Results No results found for this or any previous visit (from the past 240 hour(s)).  Radiology Reports DG Chest 1 View  Result Date: 01/02/2020 CLINICAL DATA:  Check tracheostomy placement EXAM: CHEST  1 VIEW COMPARISON:  12/28/2019 FINDINGS: Tracheostomy tube is noted in satisfactory position. Feeding catheter is noted extending into the stomach. The lungs are well aerated. Persistent mild left perihilar density is noted improved from the prior exam. No focal bony abnormality is noted. IMPRESSION: Slight improvement in aeration in the left lung. Tubes and lines as described. Electronically Signed   By: Inez Catalina M.D.   On: 01/02/2020 11:41   DG Chest 1 View  Result Date: 12/14/2019 CLINICAL DATA:  COVID-19 positive. EXAM: CHEST  1 VIEW COMPARISON:  Dec 13, 2019 FINDINGS: Diffuse opacity throughout the right upper lobe persists but is less dense in the interval. There is mild opacity in left base, more focal than the right base. The remainder of the left lung is clear. Feeding tube terminates below today's film. The NG tube terminates below today's film. A left central line terminates in the SVC. The ETT is in good position. No other acute abnormalities. IMPRESSION: 1. Support apparatus as above. 2. Diffuse opacity throughout the right lung persists but is less dense in the interval. Bibasilar opacities, left greater than right are also identified. 3. No other abnormalities. Electronically Signed   By: Dorise Bullion III M.D   On: 12/14/2019 12:27   DG Chest Port 1 View  Result Date: 01/05/2020 CLINICAL DATA:  Respiratory failure EXAM: PORTABLE CHEST 1 VIEW COMPARISON:  Three days ago FINDINGS:  Tracheostomy tube in place. There is  a feeding tube which at least reaches the stomach. Extensive artifact from EKG leads. Low volume chest with hazy opacity likely reflecting atelectasis, improved compared to more remote priors. No visible effusion or pneumothorax. Cardiopericardial enlargement IMPRESSION: Stable low volume chest. Electronically Signed   By: Monte Fantasia M.D.   On: 01/05/2020 07:04   DG Chest Port 1 View  Result Date: 12/28/2019 CLINICAL DATA:  Follow-up respiratory distress. EXAM: PORTABLE CHEST 1 VIEW COMPARISON:  12/26/2019 and older studies. FINDINGS: There is opacity in the left perihilar and lower lung which is unchanged from the most recent prior exam. Right lung is clear. No convincing pleural effusion and no pneumothorax. Tracheostomy tube and enteric tube are stable. IMPRESSION: 1. No significant change from the most recent prior study. 2. Left perihilar and lower lung opacity, which may be due to atelectasis or infection. 3. Stable support apparatus. Electronically Signed   By: Lajean Manes M.D.   On: 12/28/2019 08:23   DG Chest Port 1 View  Result Date: 12/26/2019 CLINICAL DATA:  Hypertension.  Diabetes.  ARDS EXAM: PORTABLE CHEST 1 VIEW COMPARISON:  Yesterday FINDINGS: Tracheostomy tube in place.  The feeding tube reaches the stomach. Cardiomegaly with left more than right pulmonary opacity. There could be layering pleural fluid. No visible pneumothorax. IMPRESSION: 1. Unchanged hardware positioning. 2. Vascular congestion and more asymmetric left-sided pulmonary opacity today. Electronically Signed   By: Monte Fantasia M.D.   On: 12/26/2019 08:15   DG Chest Port 1 View  Result Date: 12/25/2019 CLINICAL DATA:  Acute respiratory failure EXAM: PORTABLE CHEST 1 VIEW COMPARISON:  Three days ago FINDINGS: Increased hazy and interstitial opacity on both sides with cephalized blood flow. Cardiomegaly and vascular pedicle widening. Tracheostomy tube in place. Feeding tube that at least reaches the stomach. Artifact  from soft tissue attenuation and hardware. IMPRESSION: Increased vascular congestion/edema. Electronically Signed   By: Monte Fantasia M.D.   On: 12/25/2019 08:48   DG Chest Port 1 View  Result Date: 12/23/2019 CLINICAL DATA:  COVID-19 positive, respiratory failure EXAM: PORTABLE CHEST 1 VIEW COMPARISON:  Chest radiograph from one day prior. FINDINGS: Tracheostomy tube tip is well positioned over the tracheal air column at the thoracic inlet. Enteric tube enters stomach with the tip not seen on this image. Left internal jugular central venous catheter terminates over the left brachiocephalic vein near the junction with the SVC. Stable cardiomediastinal silhouette with top-normal heart size. No pneumothorax. Possible stable small left pleural effusion. No right pleural effusion. Patchy parahilar and bibasilar lung opacities appear worsened at the left lung base. IMPRESSION: 1. Well-positioned support structures. No pneumothorax. 2. Patchy parahilar and bibasilar lung opacities, worsened at the left lung base, probably increased atelectasis on a background of pneumonia. Electronically Signed   By: Ilona Sorrel M.D.   On: 12/23/2019 05:19   DG Chest Port 1 View  Result Date: 12/22/2019 CLINICAL DATA:  COVID-19 positive, acute respiratory failure EXAM: PORTABLE CHEST 1 VIEW COMPARISON:  Chest radiograph from one day prior. FINDINGS: Tracheostomy tube tip overlies the tracheal air column at the thoracic inlet. Enteric tube enters stomach with the tip not seen on this image. Stable cardiomediastinal silhouette with top-normal heart size. No pneumothorax. No pleural effusion. Patchy opacities throughout both lungs, similar, with overall improved lung volumes. IMPRESSION: Patchy opacities throughout both lungs, similar, with overall improved lung volumes, compatible with COVID-19 pneumonia. Electronically Signed   By: Ilona Sorrel M.D.   On: 12/22/2019  05:30   DG Chest Port 1 View  Result Date:  12/21/2019 CLINICAL DATA:  COVID-19 positive.  Acute respiratory failure. EXAM: PORTABLE CHEST 1 VIEW COMPARISON:  Chest radiograph from one day prior. FINDINGS: Tracheostomy tube tip overlies the tracheal air column is below the thoracic inlet. Enteric tube enters stomach with the tip not seen on this image. Left internal jugular central venous catheter terminates over the left brachiocephalic vein. Stable cardiomediastinal silhouette with top-normal heart size. No pneumothorax. No right pleural effusion. Possible stable small left pleural effusion. Hazy patchy opacities throughout both lungs, similar, with improved aeration in the upper left lung. IMPRESSION: Well-positioned support structures.  No pneumothorax. Improving left upper lung aeration. Persistent hazy patchy opacities throughout both lungs, compatible with COVID-19 pneumonia and atelectasis. Possible stable small left pleural effusion. Electronically Signed   By: Ilona Sorrel M.D.   On: 12/21/2019 07:51   DG Chest Port 1 View  Result Date: 12/20/2019 CLINICAL DATA:  Tracheostomy placement. EXAM: PORTABLE CHEST 1 VIEW COMPARISON:  October 19, 2019. FINDINGS: Stable cardiomegaly. Tracheostomy and feeding tubes are in good position. No pneumothorax is noted. Large left lung opacity is noted concerning for pneumonia or atelectasis with associated pleural effusion. Right basilar atelectasis or infiltrate is noted. Bony thorax is unremarkable. IMPRESSION: Tracheostomy and feeding tubes are in good position. Large left lung opacity is noted concerning for pneumonia or atelectasis with associated pleural effusion. Right basilar atelectasis or infiltrate is noted. Electronically Signed   By: Marijo Conception M.D.   On: 12/20/2019 12:57   DG CHEST PORT 1 VIEW  Result Date: 12/19/2019 CLINICAL DATA:  Acute respiratory failure. EXAM: PORTABLE CHEST 1 VIEW COMPARISON:  12/17/2019. FINDINGS: Endotracheal tube, left IJ line, feeding tube in stable position. NG  tube noted with tip below left hemidiaphragm. Previously identified loop in the NG tube is not identified however the pharyngeal region is not imaged. Cardiomegaly with pulmonary venous congestion and bilateral interstitial prominence. Findings consistent CHF. Small left pleural effusion. Pleural effusion is decreased in size from prior exam. No pneumothorax. Thoracic spine scoliosis. IMPRESSION: 1. Endotracheal tube, left IJ line, feeding tube stable position. NG tube noted with tip below left hemidiaphragm. Previously identified looped in the NG tube is not identified however the pharyngeal region is not imaged. 2. Findings consistent congestive heart failure bilateral interstitial edema. Small left pleural effusion. Left pleural effusion is decreased in size from prior exam. Electronically Signed   By: Marcello Moores  Register   On: 12/19/2019 08:14   DG Chest Port 1 View  Result Date: 12/17/2019 CLINICAL DATA:  History of COVID-19 positivity, a recently reposition to supine from prone EXAM: PORTABLE CHEST 1 VIEW COMPARISON:  12/16/2019 FINDINGS: Cardiac shadow is stable. Endotracheal tube and feeding catheter are seen. Gastric catheter extends into the stomach although is looped within the neck. Right lung remains clear. Persistent consolidation is noted on the left. IMPRESSION: Stable appearance of the chest when compared with the prior day. The gastric catheter is looped within the pharynx and could be withdrawn slightly as clinically indicated. Electronically Signed   By: Inez Catalina M.D.   On: 12/17/2019 12:30   DG CHEST PORT 1 VIEW  Result Date: 12/16/2019 CLINICAL DATA:  Hypoxia EXAM: PORTABLE CHEST 1 VIEW COMPARISON:  Dec 16, 2019 study obtained earlier in the day FINDINGS: Endotracheal tube tip is 3.8 cm above the carina. Nasogastric tube tip and side port are below the diaphragm. Left jugular catheter tip is in the left innominate vein,  stable. No pneumothorax. There is airspace opacity throughout the  left lung with left pleural effusion. There is ill-defined opacity in the medial right base. Right lung is otherwise clear. Heart is mildly enlarged with pulmonary vascularity normal. No adenopathy. No bone lesions. IMPRESSION: Tube and catheter positions as described without pneumothorax. Persistent consolidation and airspace opacity throughout most of the left lung with small left pleural effusion, stable from earlier in the day. Ill-defined airspace opacity right base, likely a focus of pneumonia in this area as well. Stable cardiac silhouette. Electronically Signed   By: Lowella Grip III M.D.   On: 12/16/2019 12:11   DG Chest Port 1 View  Result Date: 12/16/2019 CLINICAL DATA:  COVID-19 positive EXAM: PORTABLE CHEST 1 VIEW COMPARISON:  Radiograph 12/15/2019, CT 04/26/2018 FINDINGS: *Endotracheal tube in the mid trachea. *Transesophageal feeding and some tubes terminate below the level of imaging, terminating beyond the GE junction. *Left IJ approach insert venous catheter tip terminates in the brachiocephalic vein just proximal to the brachiocephalic-caval confluence. There is markedly increased opacification of the left hemithorax with now complete opacification of the left lung with some associated air bronchograms. There is partial atelectatic collapse of the right upper lobe with some additional patchy airspace disease in the right upper lung as well. Difficult to exclude a left effusion. No visible right effusion the portion of the costophrenic sulcus is collimated. No pneumothorax is evident. Cardiomediastinal contours are largely obscured. No acute osseous or soft tissue abnormality. IMPRESSION: 1. Markedly increased opacification of the left hemithorax with now complete opacification of the left hemithorax with some associated air bronchograms. Findings are compatible with worsening consolidation and/or collapse. 2. Partial atelectatic collapse of the right upper lobe with some additional patchy  airspace disease in the right upper lung as well. 3. Lines and tubes as above. Electronically Signed   By: Lovena Le M.D.   On: 12/16/2019 06:21   DG Chest Port 1 View  Result Date: 12/15/2019 CLINICAL DATA:  Supine positioning in COVID-19 patient EXAM: PORTABLE CHEST 1 VIEW COMPARISON:  Film from earlier in the same day. FINDINGS: Endotracheal tube is seen in satisfactory position. Gastric catheter and left jugular central line are noted. There is improved aeration within the lungs bilaterally although persistent opacity is noted primarily within the right upper lobe and throughout the left lung. No sizable effusion is seen. No bony abnormality is noted. IMPRESSION: Improved aeration in supine positioning. Persistent airspace opacities are again noted bilaterally however. Tubes and lines as described. Electronically Signed   By: Inez Catalina M.D.   On: 12/15/2019 10:38   DG CHEST PORT 1 VIEW  Result Date: 12/15/2019 CLINICAL DATA:  Endotracheal tube placement. EXAM: PORTABLE CHEST 1 VIEW COMPARISON:  Dec 14, 2019 FINDINGS: The study is nearly nondiagnostic. The endotracheal tube may terminate at the level of the thoracic inlet, however adequate detection is severely limited on this study. The enteric tube appears to extend below the left hemidiaphragm. There is a left-sided central venous catheter. There is diffuse opacification of both lung fields which appears to progressed since the prior study. IMPRESSION: 1. Very limited study. 2. It appears that the endotracheal tube terminates at the thoracic inlet, however exact positioning is difficult to determine on this study. Consider short repeat interval exam for further evaluation. 3. Worsening interval airspace opacities bilaterally. Electronically Signed   By: Constance Holster M.D.   On: 12/15/2019 02:39   DG CHEST PORT 1 VIEW  Result Date: 12/13/2019 CLINICAL DATA:  Acute respiratory failure. EXAM: PORTABLE CHEST 1 VIEW COMPARISON:  12/13/2019.  FINDINGS: Endotracheal tube in satisfactory position. Nasogastric tube extending into the stomach. Left jugular catheter tip in the proximal superior vena cava. Interval dense opacity in the right mid and upper lung zones. Mild-to-moderate prominence of the interstitial markings with mild improvement on images with an improved inspiration. No pneumothorax. Unremarkable bones. IMPRESSION: 1. Interval dense probable pneumonia in the right mid and upper lung zones. 2. Mildly improved interstitial pulmonary edema. Electronically Signed   By: Claudie Revering M.D.   On: 12/13/2019 14:37   DG Chest Port 1 View  Result Date: 12/13/2019 CLINICAL DATA:  Acute respiratory failure. EXAM: PORTABLE CHEST 1 VIEW COMPARISON:  12/11/2019 FINDINGS: The endotracheal tube, NG tube and left IJ catheters are stable. Persistent bilateral interstitial and airspace process in the lungs but slight improved aeration of the upper lung fields when compared to prior study. IMPRESSION: 1. Stable support apparatus. 2. Persistent bilateral interstitial and airspace process in the lungs but slight improved aeration of the upper lung fields. Electronically Signed   By: Marijo Sanes M.D.   On: 12/13/2019 06:59   DG Abd Portable 1V  Result Date: 12/13/2019 CLINICAL DATA:  Feeding tube EXAM: PORTABLE ABDOMEN - 1 VIEW COMPARISON:  None. FINDINGS: Enteric tube passes into the distal stomach and likely within into the duodenum. Bowel gas pattern is not well evaluated. IMPRESSION: Enteric tube passes into the duodenum. Electronically Signed   By: Macy Mis M.D.   On: 12/13/2019 11:01

## 2020-01-12 DIAGNOSIS — Z930 Tracheostomy status: Secondary | ICD-10-CM

## 2020-01-12 LAB — COMPREHENSIVE METABOLIC PANEL
ALT: 30 U/L (ref 0–44)
AST: 24 U/L (ref 15–41)
Albumin: 3 g/dL — ABNORMAL LOW (ref 3.5–5.0)
Alkaline Phosphatase: 58 U/L (ref 38–126)
Anion gap: 12 (ref 5–15)
BUN: 24 mg/dL — ABNORMAL HIGH (ref 6–20)
CO2: 27 mmol/L (ref 22–32)
Calcium: 9.3 mg/dL (ref 8.9–10.3)
Chloride: 98 mmol/L (ref 98–111)
Creatinine, Ser: 1.3 mg/dL — ABNORMAL HIGH (ref 0.61–1.24)
GFR calc Af Amer: >60 mL/min (ref >60)
GFR calc non Af Amer: >60 mL/min (ref >60)
Glucose, Bld: 130 mg/dL — ABNORMAL HIGH (ref 70–99)
Potassium: 2.9 mmol/L — ABNORMAL LOW (ref 3.5–5.1)
Sodium: 137 mmol/L (ref 135–145)
Total Bilirubin: 0.3 mg/dL (ref 0.3–1.2)
Total Protein: 7.6 g/dL (ref 6.5–8.1)

## 2020-01-12 LAB — CBC WITH DIFFERENTIAL/PLATELET
Abs Immature Granulocytes: 0.02 10*3/uL (ref 0.00–0.07)
Basophils Absolute: 0 10*3/uL (ref 0.0–0.1)
Basophils Relative: 0 %
Eosinophils Absolute: 0.1 10*3/uL (ref 0.0–0.5)
Eosinophils Relative: 2 %
HCT: 32.2 % — ABNORMAL LOW (ref 39.0–52.0)
Hemoglobin: 9.8 g/dL — ABNORMAL LOW (ref 13.0–17.0)
Immature Granulocytes: 0 %
Lymphocytes Relative: 26 %
Lymphs Abs: 2 10*3/uL (ref 0.7–4.0)
MCH: 29.2 pg (ref 26.0–34.0)
MCHC: 30.4 g/dL (ref 30.0–36.0)
MCV: 95.8 fL (ref 80.0–100.0)
Monocytes Absolute: 0.5 10*3/uL (ref 0.1–1.0)
Monocytes Relative: 6 %
Neutro Abs: 5.1 10*3/uL (ref 1.7–7.7)
Neutrophils Relative %: 66 %
Platelets: 194 10*3/uL (ref 150–400)
RBC: 3.36 MIL/uL — ABNORMAL LOW (ref 4.22–5.81)
RDW: 14.6 % (ref 11.5–15.5)
WBC: 7.7 10*3/uL (ref 4.0–10.5)
nRBC: 0 % (ref 0.0–0.2)

## 2020-01-12 LAB — BRAIN NATRIURETIC PEPTIDE: B Natriuretic Peptide: 18.9 pg/mL (ref 0.0–100.0)

## 2020-01-12 LAB — GLUCOSE, CAPILLARY
Glucose-Capillary: 136 mg/dL — ABNORMAL HIGH (ref 70–99)
Glucose-Capillary: 121 mg/dL — ABNORMAL HIGH (ref 70–99)
Glucose-Capillary: 96 mg/dL (ref 70–99)
Glucose-Capillary: 119 mg/dL — ABNORMAL HIGH (ref 70–99)

## 2020-01-12 LAB — POTASSIUM: Potassium: 3.5 mmol/L (ref 3.5–5.1)

## 2020-01-12 LAB — MAGNESIUM: Magnesium: 2.1 mg/dL (ref 1.7–2.4)

## 2020-01-12 LAB — D-DIMER, QUANTITATIVE: D-Dimer, Quant: 1.49 ug/mL-FEU — ABNORMAL HIGH (ref 0.00–0.50)

## 2020-01-12 MED ORDER — INSULIN ASPART 100 UNIT/ML ~~LOC~~ SOLN: 0.0000 [IU] | Freq: Every day | SUBCUTANEOUS | Status: DC

## 2020-01-12 MED ORDER — INSULIN ASPART 100 UNIT/ML ~~LOC~~ SOLN
0.0000 [IU] | Freq: Three times a day (TID) | SUBCUTANEOUS | Status: DC
  Administered 2020-01-12 – 2020-01-13 (×3): 2 [IU] via SUBCUTANEOUS

## 2020-01-12 MED ORDER — INSULIN DETEMIR 100 UNIT/ML ~~LOC~~ SOLN
5.0000 [IU] | Freq: Every day | SUBCUTANEOUS | Status: DC
  Administered 2020-01-12 – 2020-01-13 (×2): 5 [IU] via SUBCUTANEOUS
  Filled 2020-01-12 (×2): qty 0.05

## 2020-01-12 MED ORDER — SODIUM CHLORIDE 0.9 % IV SOLN
INTRAVENOUS | Status: DC
  Filled 2020-01-12: qty 1000

## 2020-01-12 MED ORDER — POTASSIUM CHLORIDE IN NACL 40-0.9 MEQ/L-% IV SOLN
INTRAVENOUS | Status: DC
  Filled 2020-01-12 (×4): qty 1000

## 2020-01-12 MED ORDER — POTASSIUM CHLORIDE CRYS ER 20 MEQ PO TBCR
40.0000 meq | EXTENDED_RELEASE_TABLET | Freq: Once | ORAL | Status: AC
  Administered 2020-01-12: 40 meq via ORAL
  Filled 2020-01-12: qty 2

## 2020-01-12 MED ORDER — POTASSIUM CHLORIDE CRYS ER 20 MEQ PO TBCR
20.0000 meq | EXTENDED_RELEASE_TABLET | Freq: Every day | ORAL | Status: DC
  Administered 2020-01-13: 20 meq via ORAL
  Filled 2020-01-12: qty 1

## 2020-01-12 NOTE — Progress Notes (Signed)
PROGRESS NOTE                                                                                                                                                                                                             Patient Demographics:    Clifford Nichols, is a 39 y.o. male, DOB - 06/15/1981, ZOX:[id number]  Outpatient Primary [facility] the [facility]    LOS - 39  Admit date - 12/04/2019    No chief complaint on file.      Brief Narrative  - 39 year old morbidly obese African-American gentleman with history of hypertension, DM type II, admitted to the hospital with severe shortness of breath diagnosed with severe acute hypoxic respiratory failure due to COVID-19 pneumonia and ARDS, required intubation and tracheostomy, was stabilized, placed on trach collar and transferred to my care on 01/11/2020 on day 38 of his hospital stay.  Significant events   [facility]  5/11 failed [facility], BiPAP.  Required intubation [facility] evaluated by opthal for proptosis- no intervention required 5/17 Started paralytics, ECMO team consulted Not a candidate 5/19 Started proning 5/23 Off pressors 5/26 Proning stopped, continue paralytics 5/27 New tongue wound 2/2 bite block in place, weaned off ketamine. Daily fevers 5/28 Trach, stop paralytics 6/1 Out of airborne isolation, family visiting, on PSV weans 6/6 Tolerated ATC yesterday for 12 hrs 6/7 remained on ATC 6/11 remains on ATC  Procedures ETT 5/11 >> 5/28 L femoral CVC 5/11 >> 5/17 R radial art line 5/17 >> 5/28 L IJ CVC 5/17 >> 6/1    Subjective:   Patient in bed, appears comfortable, denies any headache, no fever, no chest pain or pressure, no shortness of breath , no abdominal pain. No focal weakness.   Assessment  & Plan :    1. Acute Hypoxic Resp. Failure due to Acute Covid 19 Viral Pneumonitis initially  presented with severe sepsis and ARDS -   He had severe disease, required intubation and eventual tracheostomy.  Received treatment under the care of pulmonary critical care, has been stabilized on trach collar, he has finished his COVID-19 treatment under pulmonary critical care and has been transferred to hospitalist service on 01/11/2020 on day 38 of his hospital stay.  Currently stable on trach collar, pulmonary critical care monitoring and following him  for that.  Hopefully he can get decannulated soon.  Encouraged the patient to sit up in chair in the daytime use I-S for pulmonary toiletry.  Will advance activity and titrate down oxygen as possible.    SpO2: 99 % O2 Flow Rate (L/min): 5 L/min FiO2 (%): 28 %   Recent Labs  Lab 01/06/20 0302 01/07/20 0241 01/08/20 0413 01/09/20 1151 01/12/20 0244  NA 140 140 139 135 137  K 4.0 3.5 3.3* 3.6 2.9*  CL 89* 89* 88* 92* 98  CO2 39* 38* 38* 31 27  GLUCOSE 125* 142* 144* 157* 130*  BUN 44* 50* 50* 32* 24*  CREATININE 1.19 1.37* 1.31* 1.10 1.30*  CALCIUM 9.5 9.4 9.3 9.5 9.3  AST  --   --   --   --  24  ALT  --   --   --   --  30  ALKPHOS  --   --   --   --  58  BILITOT  --   --   --   --  0.3  ALBUMIN  --   --   --   --  3.0*  MG 2.1 2.1 2.1  --  2.1  DDIMER  --   --   --   --  1.49*  BNP  --   --   --   --  18.9         2.  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  Chronic problem needs outpatient ophthalmology follow-up post discharge.  3.  Septic shock with bacterial infection.  Resolved, treated under critical care service.  Currently not on antibiotics.  4.  Morbid obesity with BMI of 54.  Follow with PCP for weight loss.  5.  Mild to moderate elevation of D-dimer.  In the setting of morbid obesity, moderate dose Lovenox and monitor.  6.  Tongue bite with necrosis.  Resolved after supportive care under the care of critical care.  No active issue.  7.  Acute toxic and metabolic encephalopathy.   Resolved.  8.  Plantar fasciitis.  Supportive care.  9.  Hypokalemia.  Replaced.    10. DM type II.  On Levimir + ISS  Lab Results  Component Value Date   HGBA1C 6.6 (H) 12/03/2019   CBG (last 3)  Recent Labs    01/11/20 1133 01/11/20 1543 01/11/20 2147  GLUCAP 119* 107* 115*    Condition - Fair  Family Communication  :  Wife Carollee Leitz [phone] 01/12/20  Code Status :  Full  Consults  :  Optho, PCCM  PUD Prophylaxis : None  Disposition Plan  :    Status is: Inpatient  Remains inpatient appropriate because:Inpatient level of care appropriate due to severity of illness   Dispo: The patient is from: Home              Anticipated d/c is to: SNF              Anticipated d/c date is: 3 days              Patient currently is not medically stable to d/c.   DVT Prophylaxis  :  Lovenox    Lab Results  Component Value Date   PLT 194 01/12/2020    Diet :  Diet Order            Diet regular Room service appropriate? Yes with Assist; Fluid consistency: Thin  Diet effective now  Inpatient Medications  Scheduled Meds: . vitamin C  500 mg Oral Daily  . Chlorhexidine Gluconate Cloth  6 each Topical Daily  . collagenase   Topical Daily  . enoxaparin (LOVENOX) injection  80 mg Subcutaneous Q12H  . gabapentin  300 mg Oral BID  . insulin aspart  0-15 Units Subcutaneous TID WC  . insulin aspart  0-5 Units Subcutaneous QHS  . insulin detemir  5 Units Subcutaneous BID  . melatonin  3 mg Oral QHS  . oxyCODONE  10 mg Oral Q4H  . [START ON 01/13/2020] potassium chloride  20 mEq Oral Daily  . potassium chloride  40 mEq Oral Once  . zinc sulfate  220 mg Oral Daily   Continuous Infusions: . 0.9 % NaCl with KCl 40 mEq / L     PRN Meds:.acetaminophen (TYLENOL) oral liquid 160 mg/5 mL, guaiFENesin, lip balm, zolpidem  Antibiotics  :    Anti-infectives (From admission, onward)   Start     Dose/Rate Route Frequency Ordered Stop   12/21/19 0900   vancomycin (VANCOREADY) IVPB 2000 mg/400 mL        2,000 mg 200 mL/hr over 120 Minutes Intravenous Every 24 hours 12/20/19 0845 12/24/19 1156   12/20/19 0900  vancomycin (VANCOCIN) 2,500 mg in sodium chloride 0.9 % 500 mL IVPB        2,500 mg 250 mL/hr over 120 Minutes Intravenous  Once 12/20/19 0845 12/20/19 1237   12/14/19 0000  piperacillin-tazobactam (ZOSYN) IVPB 3.375 g        3.375 g 12.5 mL/hr over 240 Minutes Intravenous Every 8 hours 12/13/19 1652 12/19/19 1953   12/13/19 1700  piperacillin-tazobactam (ZOSYN) IVPB 3.375 g        3.375 g 100 mL/hr over 30 Minutes Intravenous  Once 12/13/19 1651 12/13/19 1741   12/10/19 0100  ceFEPIme (MAXIPIME) 2 g in sodium chloride 0.9 % 100 mL IVPB  Status:  Discontinued        2 g 200 mL/hr over 30 Minutes Intravenous Every 8 hours 12/09/19 2044 12/12/19 0942   12/09/19 2200  ceftaroline (TEFLARO) 600 mg in sodium chloride 0.9 % 250 mL IVPB  Status:  Discontinued        600 mg 250 mL/hr over 60 Minutes Intravenous Every 12 hours 12/09/19 2023 12/09/19 2024   12/09/19 2200  ceftaroline (TEFLARO) 600 mg in sodium chloride 0.9 % 250 mL IVPB  Status:  Discontinued       Note to Pharmacy: Pharmacy may modify dose for renal failure.   600 mg 250 mL/hr over 60 Minutes Intravenous Every 12 hours 12/09/19 2024 12/09/19 2044   12/09/19 2130  linezolid (ZYVOX) IVPB 600 mg        600 mg 300 mL/hr over 60 Minutes Intravenous Every 12 hours 12/09/19 2044 12/15/19 2349   12/09/19 0900  ceFEPIme (MAXIPIME) 2 g in sodium chloride 0.9 % 100 mL IVPB  Status:  Discontinued        2 g 200 mL/hr over 30 Minutes Intravenous Every 8 hours 12/09/19 0836 12/09/19 2023   12/05/19 1000  remdesivir 100 mg in sodium chloride 0.9 % 100 mL IVPB        100 mg 200 mL/hr over 30 Minutes Intravenous Daily 12/04/19 1417 12/06/19 1003       Time Spent in minutes  30   Susa Raring M.D on 01/12/2020 at 9:01 AM  To page go to [email or web address] - password [email or web address]  [facility] -  Office  [phone]     See all Orders from today for further details    Objective:   Vitals:   01/11/20 2100 01/12/20 0030 01/12/20 0521 01/12/20 0855  BP: 111/76  (!) 132/91   Pulse: (!) 107  100 98  Resp: (!) 25 20  18   Temp: 98.7 F (37.1 C)  98.8 F (37.1 C)   TempSrc: Oral  Oral   SpO2: 92%  100% 99%  Weight:      Height:        Wt Readings from Last 3 Encounters:  01/10/20 (!) 158.5 kg  12/04/19 (!) 172 kg  07/26/18 (!) 178.8 kg     Intake/Output Summary (Last 24 hours) at 01/12/2020 0901 Last data filed at 01/12/2020 0523 Gross per 24 hour  Intake 240 ml  Output 1450 ml  Net -1210 ml     Physical Exam  Awake Alert, No new F.N deficits, Normal affect [place].AT,PERRAL Supple Neck,No JVD, No cervical lymphadenopathy appriciated. Trach collar Symmetrical Chest wall movement, Good air movement bilaterally, CTAB RRR,No Gallops, Rubs or new Murmurs, No Parasternal Heave +ve B.Sounds, Abd Soft, No tenderness, No organomegaly appriciated, No rebound - guarding or rigidity. No Cyanosis, Clubbing or edema, No new Rash or bruise    Data Review:    CBC Recent Labs  Lab 01/08/20 0413 01/11/20 0708 01/12/20 0244  WBC 8.1 7.4 7.7  HGB 9.5* 10.1* 9.8*  HCT 31.6* 33.9* 32.2*  PLT 189 209 194  MCV 99.7 97.4 95.8  MCH 30.0 29.0 29.2  MCHC 30.1 29.8* 30.4  RDW 14.8 14.6 14.6  LYMPHSABS  --   --  2.0  MONOABS  --   --  0.5  EOSABS  --   --  0.1  BASOSABS  --   --  0.0    Chemistries  Recent Labs  Lab 01/06/20 0302 01/07/20 0241 01/08/20 0413 01/09/20 1151 01/12/20 0244  NA 140 140 139 135 137  K 4.0 3.5 3.3* 3.6 2.9*  CL 89* 89* 88* 92* 98  CO2 39* 38* 38* 31 27  GLUCOSE 125* 142* 144* 157* 130*  BUN 44* 50* 50* 32* 24*  CREATININE 1.19 1.37* 1.31* 1.10 1.30*  CALCIUM 9.5 9.4 9.3 9.5 9.3  AST  --   --   --   --  24  ALT  --   --   --   --  30  ALKPHOS  --   --   --   --  58  BILITOT  --   --   --   --  0.3  MG 2.1 2.1 2.1  --   2.1     ------------------------------------------------------------------------------------------------------------------ No results for input(s): CHOL, HDL, LDLCALC, TRIG, CHOLHDL, LDLDIRECT in the last 72 hours.  Lab Results  Component Value Date   HGBA1C 6.6 (H) 12/03/2019   ------------------------------------------------------------------------------------------------------------------ No results for input(s): TSH, T4TOTAL, T3FREE, THYROIDAB in the last 72 hours.  Invalid input(s): FREET3  Cardiac Enzymes No results for input(s): CKMB, TROPONINI, MYOGLOBIN in the last 168 hours.  Invalid input(s): CK ------------------------------------------------------------------------------------------------------------------    Component Value Date/Time   BNP 18.9 01/12/2020 0244    Micro Results No results found for this or any previous visit (from the past 240 hour(s)).  Radiology Reports DG Chest 1 View  Result Date: 01/02/2020 CLINICAL DATA:  Check tracheostomy placement EXAM: CHEST  1 VIEW COMPARISON:  12/28/2019 FINDINGS: Tracheostomy tube is noted in satisfactory position. Feeding catheter is noted extending into the stomach. The lungs  are well aerated. Persistent mild left perihilar density is noted improved from the prior exam. No focal bony abnormality is noted. IMPRESSION: Slight improvement in aeration in the left lung. Tubes and lines as described. Electronically Signed   By: Alcide Clever M.D.   On: 01/02/2020 11:41   DG Chest 1 View  Result Date: 12/14/2019 CLINICAL DATA:  COVID-19 positive. EXAM: CHEST  1 VIEW COMPARISON:  Dec 13, 2019 FINDINGS: Diffuse opacity throughout the right upper lobe persists but is less dense in the interval. There is mild opacity in left base, more focal than the right base. The remainder of the left lung is clear. Feeding tube terminates below today's film. The NG tube terminates below today's film. A left central line terminates in the SVC.  The ETT is in good position. No other acute abnormalities. IMPRESSION: 1. Support apparatus as above. 2. Diffuse opacity throughout the right lung persists but is less dense in the interval. Bibasilar opacities, left greater than right are also identified. 3. No other abnormalities. Electronically Signed   By: Gerome Sam III M.D   On: 12/14/2019 12:27   DG Chest Port 1 View  Result Date: 01/05/2020 CLINICAL DATA:  Respiratory failure EXAM: PORTABLE CHEST 1 VIEW COMPARISON:  Three days ago FINDINGS: Tracheostomy tube in place. There is a feeding tube which at least reaches the stomach. Extensive artifact from EKG leads. Low volume chest with hazy opacity likely reflecting atelectasis, improved compared to more remote priors. No visible effusion or pneumothorax. Cardiopericardial enlargement IMPRESSION: Stable low volume chest. Electronically Signed   By: Marnee Spring M.D.   On: 01/05/2020 07:04   DG Chest Port 1 View  Result Date: 12/28/2019 CLINICAL DATA:  Follow-up respiratory distress. EXAM: PORTABLE CHEST 1 VIEW COMPARISON:  12/26/2019 and older studies. FINDINGS: There is opacity in the left perihilar and lower lung which is unchanged from the most recent prior exam. Right lung is clear. No convincing pleural effusion and no pneumothorax. Tracheostomy tube and enteric tube are stable. IMPRESSION: 1. No significant change from the most recent prior study. 2. Left perihilar and lower lung opacity, which may be due to atelectasis or infection. 3. Stable support apparatus. Electronically Signed   By: Amie Portland M.D.   On: 12/28/2019 08:23   DG Chest Port 1 View  Result Date: 12/26/2019 CLINICAL DATA:  Hypertension.  Diabetes.  ARDS EXAM: PORTABLE CHEST 1 VIEW COMPARISON:  Yesterday FINDINGS: Tracheostomy tube in place.  The feeding tube reaches the stomach. Cardiomegaly with left more than right pulmonary opacity. There could be layering pleural fluid. No visible pneumothorax. IMPRESSION: 1.  Unchanged hardware positioning. 2. Vascular congestion and more asymmetric left-sided pulmonary opacity today. Electronically Signed   By: Marnee Spring M.D.   On: 12/26/2019 08:15   DG Chest Port 1 View  Result Date: 12/25/2019 CLINICAL DATA:  Acute respiratory failure EXAM: PORTABLE CHEST 1 VIEW COMPARISON:  Three days ago FINDINGS: Increased hazy and interstitial opacity on both sides with cephalized blood flow. Cardiomegaly and vascular pedicle widening. Tracheostomy tube in place. Feeding tube that at least reaches the stomach. Artifact from soft tissue attenuation and hardware. IMPRESSION: Increased vascular congestion/edema. Electronically Signed   By: Marnee Spring M.D.   On: 12/25/2019 08:48   DG Chest Port 1 View  Result Date: 12/23/2019 CLINICAL DATA:  COVID-19 positive, respiratory failure EXAM: PORTABLE CHEST 1 VIEW COMPARISON:  Chest radiograph from one day prior. FINDINGS: Tracheostomy tube tip is well positioned over the tracheal air  column at the thoracic inlet. Enteric tube enters stomach with the tip not seen on this image. Left internal jugular central venous catheter terminates over the left brachiocephalic vein near the junction with the SVC. Stable cardiomediastinal silhouette with top-normal heart size. No pneumothorax. Possible stable small left pleural effusion. No right pleural effusion. Patchy parahilar and bibasilar lung opacities appear worsened at the left lung base. IMPRESSION: 1. Well-positioned support structures. No pneumothorax. 2. Patchy parahilar and bibasilar lung opacities, worsened at the left lung base, probably increased atelectasis on a background of pneumonia. Electronically Signed   By: Delbert Phenix M.D.   On: 12/23/2019 05:19   DG Chest Port 1 View  Result Date: 12/22/2019 CLINICAL DATA:  COVID-19 positive, acute respiratory failure EXAM: PORTABLE CHEST 1 VIEW COMPARISON:  Chest radiograph from one day prior. FINDINGS: Tracheostomy tube tip overlies the  tracheal air column at the thoracic inlet. Enteric tube enters stomach with the tip not seen on this image. Stable cardiomediastinal silhouette with top-normal heart size. No pneumothorax. No pleural effusion. Patchy opacities throughout both lungs, similar, with overall improved lung volumes. IMPRESSION: Patchy opacities throughout both lungs, similar, with overall improved lung volumes, compatible with COVID-19 pneumonia. Electronically Signed   By: Delbert Phenix M.D.   On: 12/22/2019 05:30   DG Chest Port 1 View  Result Date: 12/21/2019 CLINICAL DATA:  COVID-19 positive.  Acute respiratory failure. EXAM: PORTABLE CHEST 1 VIEW COMPARISON:  Chest radiograph from one day prior. FINDINGS: Tracheostomy tube tip overlies the tracheal air column is below the thoracic inlet. Enteric tube enters stomach with the tip not seen on this image. Left internal jugular central venous catheter terminates over the left brachiocephalic vein. Stable cardiomediastinal silhouette with top-normal heart size. No pneumothorax. No right pleural effusion. Possible stable small left pleural effusion. Hazy patchy opacities throughout both lungs, similar, with improved aeration in the upper left lung. IMPRESSION: Well-positioned support structures.  No pneumothorax. Improving left upper lung aeration. Persistent hazy patchy opacities throughout both lungs, compatible with COVID-19 pneumonia and atelectasis. Possible stable small left pleural effusion. Electronically Signed   By: Delbert Phenix M.D.   On: 12/21/2019 07:51   DG Chest Port 1 View  Result Date: 12/20/2019 CLINICAL DATA:  Tracheostomy placement. EXAM: PORTABLE CHEST 1 VIEW COMPARISON:  October 19, 2019. FINDINGS: Stable cardiomegaly. Tracheostomy and feeding tubes are in good position. No pneumothorax is noted. Large left lung opacity is noted concerning for pneumonia or atelectasis with associated pleural effusion. Right basilar atelectasis or infiltrate is noted. Bony thorax  is unremarkable. IMPRESSION: Tracheostomy and feeding tubes are in good position. Large left lung opacity is noted concerning for pneumonia or atelectasis with associated pleural effusion. Right basilar atelectasis or infiltrate is noted. Electronically Signed   By: Lupita Raider M.D.   On: 12/20/2019 12:57   DG CHEST PORT 1 VIEW  Result Date: 12/19/2019 CLINICAL DATA:  Acute respiratory failure. EXAM: PORTABLE CHEST 1 VIEW COMPARISON:  12/17/2019. FINDINGS: Endotracheal tube, left IJ line, feeding tube in stable position. NG tube noted with tip below left hemidiaphragm. Previously identified loop in the NG tube is not identified however the pharyngeal region is not imaged. Cardiomegaly with pulmonary venous congestion and bilateral interstitial prominence. Findings consistent CHF. Small left pleural effusion. Pleural effusion is decreased in size from prior exam. No pneumothorax. Thoracic spine scoliosis. IMPRESSION: 1. Endotracheal tube, left IJ line, feeding tube stable position. NG tube noted with tip below left hemidiaphragm. Previously identified looped in the NG  tube is not identified however the pharyngeal region is not imaged. 2. Findings consistent congestive heart failure bilateral interstitial edema. Small left pleural effusion. Left pleural effusion is decreased in size from prior exam. Electronically Signed   By: Maisie Fus  Register   On: 12/19/2019 08:14   DG Chest Port 1 View  Result Date: 12/17/2019 CLINICAL DATA:  History of COVID-19 positivity, a recently reposition to supine from prone EXAM: PORTABLE CHEST 1 VIEW COMPARISON:  12/16/2019 FINDINGS: Cardiac shadow is stable. Endotracheal tube and feeding catheter are seen. Gastric catheter extends into the stomach although is looped within the neck. Right lung remains clear. Persistent consolidation is noted on the left. IMPRESSION: Stable appearance of the chest when compared with the prior day. The gastric catheter is looped within the  pharynx and could be withdrawn slightly as clinically indicated. Electronically Signed   By: Alcide Clever M.D.   On: 12/17/2019 12:30   DG CHEST PORT 1 VIEW  Result Date: 12/16/2019 CLINICAL DATA:  Hypoxia EXAM: PORTABLE CHEST 1 VIEW COMPARISON:  Dec 16, 2019 study obtained earlier in the day FINDINGS: Endotracheal tube tip is 3.8 cm above the carina. Nasogastric tube tip and side port are below the diaphragm. Left jugular catheter tip is in the left innominate vein, stable. No pneumothorax. There is airspace opacity throughout the left lung with left pleural effusion. There is ill-defined opacity in the medial right base. Right lung is otherwise clear. Heart is mildly enlarged with pulmonary vascularity normal. No adenopathy. No bone lesions. IMPRESSION: Tube and catheter positions as described without pneumothorax. Persistent consolidation and airspace opacity throughout most of the left lung with small left pleural effusion, stable from earlier in the day. Ill-defined airspace opacity right base, likely a focus of pneumonia in this area as well. Stable cardiac silhouette. Electronically Signed   By: Bretta Bang III M.D.   On: 12/16/2019 12:11   DG Chest Port 1 View  Result Date: 12/16/2019 CLINICAL DATA:  COVID-19 positive EXAM: PORTABLE CHEST 1 VIEW COMPARISON:  Radiograph 12/15/2019, CT 04/26/2018 FINDINGS: *Endotracheal tube in the mid trachea. *Transesophageal feeding and some tubes terminate below the level of imaging, terminating beyond the GE junction. *Left IJ approach insert venous catheter tip terminates in the brachiocephalic vein just proximal to the brachiocephalic-caval confluence. There is markedly increased opacification of the left hemithorax with now complete opacification of the left lung with some associated air bronchograms. There is partial atelectatic collapse of the right upper lobe with some additional patchy airspace disease in the right upper lung as well. Difficult to  exclude a left effusion. No visible right effusion the portion of the costophrenic sulcus is collimated. No pneumothorax is evident. Cardiomediastinal contours are largely obscured. No acute osseous or soft tissue abnormality. IMPRESSION: 1. Markedly increased opacification of the left hemithorax with now complete opacification of the left hemithorax with some associated air bronchograms. Findings are compatible with worsening consolidation and/or collapse. 2. Partial atelectatic collapse of the right upper lobe with some additional patchy airspace disease in the right upper lung as well. 3. Lines and tubes as above. Electronically Signed   By: Kreg Shropshire M.D.   On: 12/16/2019 06:21   DG Chest Port 1 View  Result Date: 12/15/2019 CLINICAL DATA:  Supine positioning in COVID-19 patient EXAM: PORTABLE CHEST 1 VIEW COMPARISON:  Film from earlier in the same day. FINDINGS: Endotracheal tube is seen in satisfactory position. Gastric catheter and left jugular central line are noted. There is improved aeration within  the lungs bilaterally although persistent opacity is noted primarily within the right upper lobe and throughout the left lung. No sizable effusion is seen. No bony abnormality is noted. IMPRESSION: Improved aeration in supine positioning. Persistent airspace opacities are again noted bilaterally however. Tubes and lines as described. Electronically Signed   By: Alcide Clever M.D.   On: 12/15/2019 10:38   DG CHEST PORT 1 VIEW  Result Date: 12/15/2019 CLINICAL DATA:  Endotracheal tube placement. EXAM: PORTABLE CHEST 1 VIEW COMPARISON:  Dec 14, 2019 FINDINGS: The study is nearly nondiagnostic. The endotracheal tube may terminate at the level of the thoracic inlet, however adequate detection is severely limited on this study. The enteric tube appears to extend below the left hemidiaphragm. There is a left-sided central venous catheter. There is diffuse opacification of both lung fields which appears to  progressed since the prior study. IMPRESSION: 1. Very limited study. 2. It appears that the endotracheal tube terminates at the thoracic inlet, however exact positioning is difficult to determine on this study. Consider short repeat interval exam for further evaluation. 3. Worsening interval airspace opacities bilaterally. Electronically Signed   By: Katherine Mantle M.D.   On: 12/15/2019 02:39   DG CHEST PORT 1 VIEW  Result Date: 12/13/2019 CLINICAL DATA:  Acute respiratory failure. EXAM: PORTABLE CHEST 1 VIEW COMPARISON:  12/13/2019. FINDINGS: Endotracheal tube in satisfactory position. Nasogastric tube extending into the stomach. Left jugular catheter tip in the proximal superior vena cava. Interval dense opacity in the right mid and upper lung zones. Mild-to-moderate prominence of the interstitial markings with mild improvement on images with an improved inspiration. No pneumothorax. Unremarkable bones. IMPRESSION: 1. Interval dense probable pneumonia in the right mid and upper lung zones. 2. Mildly improved interstitial pulmonary edema. Electronically Signed   By: Beckie Salts M.D.   On: 12/13/2019 14:37   DG Abd Portable 1V  Result Date: 12/13/2019 CLINICAL DATA:  Feeding tube EXAM: PORTABLE ABDOMEN - 1 VIEW COMPARISON:  None. FINDINGS: Enteric tube passes into the distal stomach and likely within into the duodenum. Bowel gas pattern is not well evaluated. IMPRESSION: Enteric tube passes into the duodenum. Electronically Signed   By: Guadlupe Spanish M.D.   On: 12/13/2019 11:01

## 2020-01-12 NOTE — Progress Notes (Signed)
NAME:  Clifford Nichols, MRN:  [id number], DOB:  1981-07-18, LOS: 77 [facility] ADMISSION DATE:  12/04/2019, [facility] Admission DATE:  12/04/19 REFERRING MD:  Dr Mortimer Fries, CCM, CHIEF COMPLAINT:  Acute respiratory failure   Brief History   39 year old obese man with hypertension, diabetes admitted with acute respiratory failure, ARDS from COVID-19 pneumonia, initial positive test 5/10  Past Medical History    has a past medical history of Abscess of upper back excluding scapular region, Diabetes mellitus without complication ([place]), GERD (gastroesophageal reflux disease), Hypertension, and Sebaceous cyst.  Significant Hospital Events   5/10 admitted [facility]  5/11 failed HHFNC, BiPAP.  Required intubation 5/12 transferred to [facility], evaluated by opthal for proptosis- no intervention required 5/17 Started paralytics, ECMO team consulted Not a candidate 5/19 Started proning 5/23 Off pressors 5/26 Proning stopped, continue paralytics 5/27 New tongue wound 2/2 bite block in place, weaned off ketamine. Daily fevers 5/28 Trach, stop paralytics 6/1 Out of airborne isolation, family visiting, on PSV weans 6/6 Tolerated ATC yesterday for 12 hrs 6/7 remained on ATC 6/11 remains on ATC  Consults:  Ophthalmology for proptosis 5/12  Procedures:  ETT 5/11 >> 5/28 L femoral CVC 5/11 >> 5/17 R radial art line 5/17 >> 5/28 L IJ CVC 5/17 >> 6/1  Trach 5/28  Significant Diagnostic Tests:  Head CT 5/11 >> no significant intracranial abnormality.  Scattered opacification and air-fluid levels in the frontal, ethmoid, sphenoid and maxillary sinuses of unclear significance.  Head CT 5/12 >> no significant intracranial abnormality  Lower extremity Doppler 5/14 >> negative for DVT  Micro Data:  SARS CoV2 5/10 >> positive MRSA screen 5/12 >> negative Respiratory 5/12 >> negative Blood 5/17 >> Staph epi and Staph hominis Respiratory 5/17 >> Staph epi 5/24 resp cx bal: staph epi 5/24 BAL acid fast: smear negative cx  pending 5/24 BAL fungal:pending 5/26 blood: No growth BAL 5/28-Acinetobacter, Proteus, Pseudomonas, Enterococcus Trach asp 6/1- Pseudomonas  Antimicrobials:  Remdesivir 5/11 >> 5/15 Dexamethasone 5/11 >> 5/20 Tocilizumab 5/12 Cefepime 5/17 >> 5/21 Linezolid 5/17 >> 5/23 Zosyn 5/21 >>  5/29 Vanco 5/28 >> 6/1  Scheduled Meds: . vitamin C  500 mg Oral Daily  . Chlorhexidine Gluconate Cloth  6 each Topical Daily  . collagenase   Topical Daily  . enoxaparin (LOVENOX) injection  80 mg Subcutaneous Q12H  . gabapentin  300 mg Oral BID  . insulin aspart  0-15 Units Subcutaneous TID WC  . insulin detemir  5 Units Subcutaneous Daily  . melatonin  3 mg Oral QHS  . oxyCODONE  10 mg Oral Q4H  . [START ON 01/13/2020] potassium chloride  20 mEq Oral Daily  . zinc sulfate  220 mg Oral Daily   Continuous Infusions: . 0.9 % NaCl with KCl 40 mEq / L 100 mL/hr at 01/12/20 0911   PRN Meds:.acetaminophen (TYLENOL) oral liquid 160 mg/5 mL, guaiFENesin, lip balm, zolpidem   Interim history/subjective:  Doing great on t collar hs and PMV daytime  - says sleeping fine and no daytime drowsiness dating back years "been told I have sleep apnea" (never studied)    Objective   Blood pressure (!) 132/91, pulse 96, temperature 98.8 F (37.1 C), temperature source Oral, resp. rate 18, height _0  (1.702 m), weight (!) 158.5 kg, SpO2 97 %.    FiO2 (%):  [28 %] 28 %   Intake/Output Summary (Last 24 hours) at 01/12/2020 1344 Last data filed at 01/12/2020 1057 Gross per 24 hour  Intake --  Output  1500 ml  Net -1500 ml   Filed Weights   01/07/20 0600 01/10/20 0417 01/10/20 1526  Weight: (!) 158.5 kg (!) 158.5 kg (!) 158.5 kg    Examination: Tax 98.8  Pt alert, approp nad @ sitting in chair  Trach in place, PMV in place and balloon down > can' t tolerate full occlusion s sense of sob though no stridor noted  Oropharynx clear,  mucosa nl Neck supple Lungs with distant  BSbilaterally RRR no s3 or or  sign murmur Abd obese with limited excursion  Extr warm with no edema or clubbing noted Neuro  Sensorium intact,  no apparent motor deficits    Resolved Hospital Problem list    Left proptosis, noted with coughing early 5/12, resolved.  Apparently per wife he has episodic intermittent proptosis at home as well. Head CT 5/12 reassuring Ophthalmology did evaluate, recommended that he will need outpatient evaluation but no intervention at this time beyond keeping the eyes moist.  Septic shock, present on admission. Tongue necrosis secondary to biting down Hypertriglyceridemia Acinetobacter HAP Acute toxic metabolic encephalopathy    Assessment & Plan:   Severe ARDS due to COVID-19 pneumonia  S/p trach  Morbid obesity,  OSA with pre admit hypersomnolence resolved with trach so need to be careful with removal until  Multiple pneumonias in the past Deconditioning DM 2 Stage III pressure ulcer Insomnia      Daily Goals Checklist  Pain/Anxiety/Delirium protocol (if indicated): prn only. Tapering clonazepam and seroquel off VAP protocol (if indicated): not intubated. Respiratory support goals: trach collar with PMV. As patient is more mobile, will look at starting capping trials Blood pressure target: MAP>65 - allow autoregulation.  DVT prophylaxis: Lovenox Nutrition Status:  patient eating full diet.  GI prophylaxis: not indicated.  Fluid status goals: Euvolvemic.  Urinary catheter: Voiding spontaneously.  Central lines: PIV only Glucose control: DM type 2 euglycemic on Detemir and meal coverage.  Mobility/therapy needs: PT- mobilize independently Antibiotic de-escalation: none Home medication reconciliation: none Daily labs: as needed Code Status: full Family Communication: Patient updated Disposition: floor     He would be fine with downsize to a smaller trach s balloon but would be careful about extubation s sleep medicine input first and clinically sounds to me like  severe OSA   Christinia Gully, MD [facility] [place] [phone] After 6:00 PM or weekends, use Beeper [phone]  After 7:00 pm call Elink  [phone]

## 2020-01-13 ENCOUNTER — Other Ambulatory Visit: Payer: Self-pay

## 2020-01-13 ENCOUNTER — Encounter (HOSPITAL_COMMUNITY): Payer: Self-pay | Admitting: Physical Medicine & Rehabilitation

## 2020-01-13 ENCOUNTER — Inpatient Hospital Stay (HOSPITAL_COMMUNITY)
Admission: RE | Admit: 2020-01-13 | Discharge: 2020-01-24 | DRG: 073 | Disposition: A | Discharge status: Home / Self Care | Payer: No Typology Code available for payment source | Source: Intra-hospital | Attending: Physical Medicine & Rehabilitation | Admitting: Physical Medicine & Rehabilitation

## 2020-01-13 DIAGNOSIS — Z8249 Family history of ischemic heart disease and other diseases of the circulatory system: Secondary | ICD-10-CM

## 2020-01-13 DIAGNOSIS — E119 Type 2 diabetes mellitus without complications: Secondary | ICD-10-CM | POA: Diagnosis present

## 2020-01-13 DIAGNOSIS — G6281 Critical illness polyneuropathy: Principal | ICD-10-CM | POA: Diagnosis present

## 2020-01-13 DIAGNOSIS — L89153 Pressure ulcer of sacral region, stage 3: Secondary | ICD-10-CM | POA: Diagnosis present

## 2020-01-13 DIAGNOSIS — D6489 Other specified anemias: Secondary | ICD-10-CM | POA: Diagnosis present

## 2020-01-13 DIAGNOSIS — G4700 Insomnia, unspecified: Secondary | ICD-10-CM | POA: Diagnosis present

## 2020-01-13 DIAGNOSIS — J9601 Acute respiratory failure with hypoxia: Secondary | ICD-10-CM | POA: Diagnosis present

## 2020-01-13 DIAGNOSIS — J9622 Acute and chronic respiratory failure with hypercapnia: Secondary | ICD-10-CM

## 2020-01-13 DIAGNOSIS — Z9889 Other specified postprocedural states: Secondary | ICD-10-CM | POA: Diagnosis not present

## 2020-01-13 DIAGNOSIS — Z6841 Body Mass Index (BMI) 40.0 and over, adult: Secondary | ICD-10-CM

## 2020-01-13 DIAGNOSIS — Z794 Long term (current) use of insulin: Secondary | ICD-10-CM

## 2020-01-13 DIAGNOSIS — M722 Plantar fascial fibromatosis: Secondary | ICD-10-CM | POA: Diagnosis present

## 2020-01-13 DIAGNOSIS — D62 Acute posthemorrhagic anemia: Secondary | ICD-10-CM | POA: Diagnosis present

## 2020-01-13 DIAGNOSIS — F17210 Nicotine dependence, cigarettes, uncomplicated: Secondary | ICD-10-CM | POA: Diagnosis present

## 2020-01-13 DIAGNOSIS — I10 Essential (primary) hypertension: Secondary | ICD-10-CM | POA: Diagnosis present

## 2020-01-13 DIAGNOSIS — M21372 Foot drop, left foot: Secondary | ICD-10-CM | POA: Diagnosis present

## 2020-01-13 DIAGNOSIS — L8990 Pressure ulcer of unspecified site, unspecified stage: Secondary | ICD-10-CM | POA: Diagnosis present

## 2020-01-13 DIAGNOSIS — Z8616 Personal history of COVID-19: Secondary | ICD-10-CM | POA: Diagnosis not present

## 2020-01-13 DIAGNOSIS — K219 Gastro-esophageal reflux disease without esophagitis: Secondary | ICD-10-CM | POA: Diagnosis present

## 2020-01-13 DIAGNOSIS — H0520 Unspecified exophthalmos: Secondary | ICD-10-CM | POA: Diagnosis present

## 2020-01-13 DIAGNOSIS — R000 Tachycardia, unspecified: Secondary | ICD-10-CM | POA: Diagnosis present

## 2020-01-13 DIAGNOSIS — Z79899 Other long term (current) drug therapy: Secondary | ICD-10-CM

## 2020-01-13 DIAGNOSIS — D696 Thrombocytopenia, unspecified: Secondary | ICD-10-CM | POA: Diagnosis present

## 2020-01-13 DIAGNOSIS — Z930 Tracheostomy status: Secondary | ICD-10-CM | POA: Diagnosis not present

## 2020-01-13 DIAGNOSIS — G4733 Obstructive sleep apnea (adult) (pediatric): Secondary | ICD-10-CM | POA: Diagnosis present

## 2020-01-13 DIAGNOSIS — M21371 Foot drop, right foot: Secondary | ICD-10-CM | POA: Diagnosis present

## 2020-01-13 DIAGNOSIS — R5381 Other malaise: Secondary | ICD-10-CM | POA: Diagnosis present

## 2020-01-13 LAB — CBC WITH DIFFERENTIAL/PLATELET
Abs Immature Granulocytes: 0.03 10*3/uL (ref 0.00–0.07)
Basophils Absolute: 0 10*3/uL (ref 0.0–0.1)
Basophils Relative: 0 %
Eosinophils Absolute: 0.1 10*3/uL (ref 0.0–0.5)
Eosinophils Relative: 1 %
HCT: 32 % — ABNORMAL LOW (ref 39.0–52.0)
Hemoglobin: 9.6 g/dL — ABNORMAL LOW (ref 13.0–17.0)
Immature Granulocytes: 0 %
Lymphocytes Relative: 28 %
Lymphs Abs: 2.2 10*3/uL (ref 0.7–4.0)
MCH: 29.2 pg (ref 26.0–34.0)
MCHC: 30 g/dL (ref 30.0–36.0)
MCV: 97.3 fL (ref 80.0–100.0)
Monocytes Absolute: 0.6 10*3/uL (ref 0.1–1.0)
Monocytes Relative: 7 %
Neutro Abs: 4.9 10*3/uL (ref 1.7–7.7)
Neutrophils Relative %: 64 %
Platelets: 197 10*3/uL (ref 150–400)
RBC: 3.29 MIL/uL — ABNORMAL LOW (ref 4.22–5.81)
RDW: 14.6 % (ref 11.5–15.5)
WBC: 7.8 10*3/uL (ref 4.0–10.5)
nRBC: 0 % (ref 0.0–0.2)

## 2020-01-13 LAB — COMPREHENSIVE METABOLIC PANEL
ALT: 30 U/L (ref 0–44)
AST: 25 U/L (ref 15–41)
Albumin: 2.8 g/dL — ABNORMAL LOW (ref 3.5–5.0)
Alkaline Phosphatase: 57 U/L (ref 38–126)
Anion gap: 11 (ref 5–15)
BUN: 17 mg/dL (ref 6–20)
CO2: 25 mmol/L (ref 22–32)
Calcium: 8.7 mg/dL — ABNORMAL LOW (ref 8.9–10.3)
Chloride: 103 mmol/L (ref 98–111)
Creatinine, Ser: 1.09 mg/dL (ref 0.61–1.24)
GFR calc Af Amer: >60 mL/min (ref >60)
GFR calc non Af Amer: >60 mL/min (ref >60)
Glucose, Bld: 137 mg/dL — ABNORMAL HIGH (ref 70–99)
Potassium: 3.5 mmol/L (ref 3.5–5.1)
Sodium: 139 mmol/L (ref 135–145)
Total Bilirubin: 0.4 mg/dL (ref 0.3–1.2)
Total Protein: 6.8 g/dL (ref 6.5–8.1)

## 2020-01-13 LAB — GLUCOSE, CAPILLARY
Glucose-Capillary: 123 mg/dL — ABNORMAL HIGH (ref 70–99)
Glucose-Capillary: 115 mg/dL — ABNORMAL HIGH (ref 70–99)
Glucose-Capillary: 99 mg/dL (ref 70–99)
Glucose-Capillary: 124 mg/dL — ABNORMAL HIGH (ref 70–99)

## 2020-01-13 LAB — MAGNESIUM: Magnesium: 2 mg/dL (ref 1.7–2.4)

## 2020-01-13 LAB — D-DIMER, QUANTITATIVE: D-Dimer, Quant: 1.5 ug/mL-FEU — ABNORMAL HIGH (ref 0.00–0.50)

## 2020-01-13 LAB — BRAIN NATRIURETIC PEPTIDE: B Natriuretic Peptide: 22.9 pg/mL (ref 0.0–100.0)

## 2020-01-13 MED ORDER — MELATONIN 3 MG PO TABS
3.0000 mg | ORAL_TABLET | Freq: Every day | ORAL | Status: DC
  Administered 2020-01-15 – 2020-01-23 (×9): 3 mg via ORAL
  Filled 2020-01-13 (×9): qty 1

## 2020-01-13 MED ORDER — GABAPENTIN 300 MG PO CAPS
300.0000 mg | ORAL_CAPSULE | Freq: Two times a day (BID) | ORAL | Status: DC
  Administered 2020-01-13: 300 mg via ORAL
  Filled 2020-01-13: qty 1

## 2020-01-13 MED ORDER — ASCORBIC ACID 500 MG PO TABS
500.0000 mg | ORAL_TABLET | Freq: Every day | ORAL | Status: DC
  Administered 2020-01-14 – 2020-01-24 (×11): 500 mg via ORAL
  Filled 2020-01-13 (×11): qty 1

## 2020-01-13 MED ORDER — GABAPENTIN 300 MG PO CAPS: 300.0000 mg | ORAL_CAPSULE | Freq: Three times a day (TID) | ORAL | Status: DC

## 2020-01-13 MED ORDER — CHLORHEXIDINE GLUCONATE CLOTH 2 % EX PADS
6.0000 | MEDICATED_PAD | Freq: Every day | CUTANEOUS | Status: DC
  Administered 2020-01-14 – 2020-01-19 (×5): 6 via TOPICAL

## 2020-01-13 MED ORDER — GUAIFENESIN 100 MG/5ML PO SOLN: 5.0000 mL | ORAL | Status: DC | PRN

## 2020-01-13 MED ORDER — ACETAMINOPHEN 325 MG PO TABS
325.0000 mg | ORAL_TABLET | ORAL | Status: DC | PRN
  Administered 2020-01-16 (×2): 650 mg via ORAL
  Filled 2020-01-13 (×2): qty 2

## 2020-01-13 MED ORDER — INSULIN ASPART 100 UNIT/ML ~~LOC~~ SOLN: 0.0000 [IU] | Freq: Every day | SUBCUTANEOUS | Status: DC

## 2020-01-13 MED ORDER — PROCHLORPERAZINE MALEATE 5 MG PO TABS: 5.0000 mg | ORAL_TABLET | Freq: Four times a day (QID) | ORAL | Status: DC | PRN

## 2020-01-13 MED ORDER — TRAZODONE HCL 50 MG PO TABS: 25.0000 mg | ORAL_TABLET | Freq: Every evening | ORAL | Status: DC | PRN

## 2020-01-13 MED ORDER — FLEET ENEMA 7-19 GM/118ML RE ENEM: 1.0000 | ENEMA | Freq: Once | RECTAL | Status: DC | PRN

## 2020-01-13 MED ORDER — POLYETHYLENE GLYCOL 3350 17 G PO PACK: 17.0000 g | PACK | Freq: Every day | ORAL | Status: DC | PRN

## 2020-01-13 MED ORDER — INSULIN ASPART 100 UNIT/ML ~~LOC~~ SOLN
0.0000 [IU] | Freq: Three times a day (TID) | SUBCUTANEOUS | Status: DC
  Administered 2020-01-14 (×2): 2 [IU] via SUBCUTANEOUS
  Administered 2020-01-20: 3 [IU] via SUBCUTANEOUS
  Administered 2020-01-22: 2 [IU] via SUBCUTANEOUS

## 2020-01-13 MED ORDER — PANTOPRAZOLE SODIUM 40 MG PO TBEC
40.0000 mg | DELAYED_RELEASE_TABLET | Freq: Every day | ORAL | Status: DC
Start: 2020-01-13 — End: 2020-01-23

## 2020-01-13 MED ORDER — ZOLPIDEM TARTRATE 5 MG PO TABS
5.0000 mg | ORAL_TABLET | Freq: Every evening | ORAL | Status: DC | PRN
  Filled 2020-01-13: qty 1

## 2020-01-13 MED ORDER — INSULIN ASPART 100 UNIT/ML ~~LOC~~ SOLN: SUBCUTANEOUS | Status: DC

## 2020-01-13 MED ORDER — GUAIFENESIN-DM 100-10 MG/5ML PO SYRP: 5.0000 mL | ORAL_SOLUTION | Freq: Four times a day (QID) | ORAL | Status: DC | PRN

## 2020-01-13 MED ORDER — PROCHLORPERAZINE EDISYLATE 10 MG/2ML IJ SOLN: 5.0000 mg | Freq: Four times a day (QID) | INTRAMUSCULAR | Status: DC | PRN

## 2020-01-13 MED ORDER — ENOXAPARIN SODIUM 80 MG/0.8ML ~~LOC~~ SOLN: 80.0000 mg | Freq: Two times a day (BID) | SUBCUTANEOUS | Status: DC

## 2020-01-13 MED ORDER — ZINC SULFATE 220 (50 ZN) MG PO CAPS
220.0000 mg | ORAL_CAPSULE | Freq: Every day | ORAL | Status: DC
  Administered 2020-01-14 – 2020-01-24 (×11): 220 mg via ORAL
  Filled 2020-01-13 (×10): qty 1

## 2020-01-13 MED ORDER — PROCHLORPERAZINE 25 MG RE SUPP: 12.5000 mg | Freq: Four times a day (QID) | RECTAL | Status: DC | PRN

## 2020-01-13 MED ORDER — ALUM & MAG HYDROXIDE-SIMETH 200-200-20 MG/5ML PO SUSP: 30.0000 mL | ORAL | Status: DC | PRN

## 2020-01-13 MED ORDER — COLLAGENASE 250 UNIT/GM EX OINT
TOPICAL_OINTMENT | Freq: Every day | CUTANEOUS | Status: AC
  Filled 2020-01-13: qty 30

## 2020-01-13 MED ORDER — ENOXAPARIN SODIUM 80 MG/0.8ML ~~LOC~~ SOLN
80.0000 mg | Freq: Two times a day (BID) | SUBCUTANEOUS | Status: DC
  Administered 2020-01-13 – 2020-01-15 (×5): 80 mg via SUBCUTANEOUS
  Filled 2020-01-13 (×6): qty 0.8

## 2020-01-13 MED ORDER — DIPHENHYDRAMINE HCL 12.5 MG/5ML PO ELIX: 12.5000 mg | ORAL_SOLUTION | Freq: Four times a day (QID) | ORAL | Status: DC | PRN

## 2020-01-13 MED ORDER — POTASSIUM CHLORIDE CRYS ER 20 MEQ PO TBCR
20.0000 meq | EXTENDED_RELEASE_TABLET | Freq: Every day | ORAL | Status: DC
  Administered 2020-01-14 – 2020-01-24 (×11): 20 meq via ORAL
  Filled 2020-01-13 (×11): qty 1

## 2020-01-13 MED ORDER — OXYCODONE HCL 5 MG PO TABS
10.0000 mg | ORAL_TABLET | ORAL | Status: DC
  Administered 2020-01-13 – 2020-01-24 (×63): 10 mg via ORAL
  Filled 2020-01-13 (×63): qty 2

## 2020-01-13 MED ORDER — LIP MEDEX EX OINT
TOPICAL_OINTMENT | CUTANEOUS | Status: DC | PRN
  Filled 2020-01-13: qty 7

## 2020-01-13 MED ORDER — BISACODYL 10 MG RE SUPP: 10.0000 mg | Freq: Every day | RECTAL | Status: DC | PRN

## 2020-01-13 MED ORDER — INSULIN DETEMIR 100 UNIT/ML ~~LOC~~ SOLN
5.0000 [IU] | Freq: Every day | SUBCUTANEOUS | Status: DC
  Administered 2020-01-14 – 2020-01-15 (×2): 5 [IU] via SUBCUTANEOUS
  Filled 2020-01-13 (×2): qty 0.05

## 2020-01-13 NOTE — Progress Notes (Signed)
Report given to Trish RN on 

## 2020-01-13 NOTE — Progress Notes (Signed)
PROGRESS NOTE                                                                                                                                                                                                             Patient Demographics:    Clifford Nichols, is a 39 y.o. male, DOB - 19-Nov-1980, ZPH:[id number]  Outpatient Primary MD for the patient is [facility]    LOS - 40  Admit date - 12/04/2019    No chief complaint on file.      Brief Narrative  - 39 year old morbidly obese African-American gentleman with history of hypertension, DM type II, admitted to the hospital with severe shortness of breath diagnosed with severe acute hypoxic respiratory failure due to COVID-19 pneumonia and ARDS, required intubation and tracheostomy, was stabilized, placed on trach collar and transferred to my care on 01/11/2020 on day 38 of his hospital stay.  Significant events   5/10 admitted [facility]  5/11 failed HHFNC, BiPAP.  Required intubation 5/12 transferred to [facility], evaluated by opthal for proptosis- no intervention required 5/17 Started paralytics, ECMO team consulted Not a candidate 5/19 Started proning 5/23 Off pressors 5/26 Proning stopped, continue paralytics 5/27 New tongue wound 2/2 bite block in place, weaned off ketamine. Daily fevers 5/28 Trach, stop paralytics 6/1 Out of airborne isolation, family visiting, on PSV weans 6/6 Tolerated ATC yesterday for 12 hrs 6/7 remained on ATC 6/11 remains on ATC  Procedures ETT 5/11 >> 5/28 L femoral CVC 5/11 >> 5/17 R radial art line 5/17 >> 5/28 L IJ CVC 5/17 >> 6/1    Subjective:   Patient in bed, appears comfortable, denies any headache, no fever, no chest pain or pressure, no shortness of breath , no abdominal pain. No focal weakness.   Assessment  & Plan :    1. Acute Hypoxic Resp. Failure due to Acute Covid 19 Viral Pneumonitis initially  presented with severe sepsis and ARDS -   He had severe disease, required intubation and eventual tracheostomy.  Received treatment under the care of pulmonary critical care, has been stabilized on trach collar, he has finished his COVID-19 treatment under pulmonary critical care and has been transferred to hospitalist service on 01/11/2020 on day 38 of his hospital stay.  Currently stable on trach collar, pulmonary critical care monitoring and following him  for that, he is morbidly obese likely has undiagnosed underlying OSA will defer decannulation to pulmonary service.  Encouraged the patient to sit up in chair in the daytime use I-S for pulmonary toiletry.  Will advance activity and titrate down oxygen as possible.    SpO2: 99 % O2 Flow Rate (L/min): 5 L/min FiO2 (%): 28 %   Recent Labs  Lab 01/07/20 0241 01/07/20 0241 01/08/20 0413 01/09/20 1151 01/12/20 0244 01/12/20 2105 01/13/20 0216  NA 140  --  139 135 137  --  139  K 3.5   < > 3.3* 3.6 2.9* 3.5 3.5  CL 89*  --  88* 92* 98  --  103  CO2 38*  --  38* 31 27  --  25  GLUCOSE 142*  --  144* 157* 130*  --  137*  BUN 50*  --  50* 32* 24*  --  17  CREATININE 1.37*  --  1.31* 1.10 1.30*  --  1.09  CALCIUM 9.4  --  9.3 9.5 9.3  --  8.7*  AST  --   --   --   --  24  --  25  ALT  --   --   --   --  30  --  30  ALKPHOS  --   --   --   --  58  --  57  BILITOT  --   --   --   --  0.3  --  0.4  ALBUMIN  --   --   --   --  3.0*  --  2.8*  MG 2.1  --  2.1  --  2.1  --  2.0  DDIMER  --   --   --   --  1.49*  --  1.50*  BNP  --   --   --   --  18.9  --  22.9   < > = values in this interval not displayed.         2.  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  Chronic problem needs outpatient ophthalmology follow-up post discharge.  3.  Septic shock with bacterial infection.  Resolved, treated under critical care service.  Currently not on antibiotics.  4.  Morbid obesity with BMI of 54.  Follow with PCP for weight  loss.  5.  Mild to moderate elevation of D-dimer.  In the setting of morbid obesity, moderate dose Lovenox and monitor.  6.  Tongue bite with necrosis.  Resolved after supportive care under the care of critical care.  No active issue.  7.  Acute toxic and metabolic encephalopathy.  Resolved.  8.  Plantar fasciitis.  Supportive care.  9.  Hypokalemia.  Replaced.    10.  Sacral pressure ulcer.  Unstageable, see wound care notes for details.  Supportive care.    11. DM type II.  On Levimir + ISS  Lab Results  Component Value Date   HGBA1C 6.6 (H) 12/03/2019   CBG (last 3)  Recent Labs    01/12/20 1635 01/12/20 2119 01/13/20 0748  GLUCAP 96 119* 123*   Pressure Injury 12/19/19 Other (Comment) Left Deep Tissue Pressure Injury - Purple or maroon localized area of discolored intact skin or blood-filled blister due to damage of underlying soft tissue from pressure and/or shear. DTI tongue (Active)  12/19/19 0315  Location: Other (Comment) (toungue)  Location Orientation: Left  Staging: Deep Tissue Pressure Injury - Purple or maroon  localized area of discolored intact skin or blood-filled blister due to damage of underlying soft tissue from pressure and/or shear.  Wound Description (Comments): DTI tongue  Present on Admission: No     Pressure Injury 12/26/19 Sacrum Mid Unstageable - Full thickness tissue loss in which the base of the injury is covered by slough (yellow, tan, gray, green or brown) and/or eschar (tan, brown or black) in the wound bed. (Active)  12/26/19 1300  Location: Sacrum  Location Orientation: Mid  Staging: Unstageable - Full thickness tissue loss in which the base of the injury is covered by slough (yellow, tan, gray, green or brown) and/or eschar (tan, brown or black) in the wound bed.  Wound Description (Comments):   Present on Admission: No    Condition - Fair  Family Communication  :  Wife Carollee Leitztelida [phone] 01/12/20  Code Status :  Full  Consults  :   Optho, PCCM  PUD Prophylaxis : None  Disposition Plan  :    Status is: Inpatient  Remains inpatient appropriate because:Inpatient level of care appropriate due to severity of illness   Dispo: The patient is from: Home              Anticipated d/c is to: CIR              Anticipated d/c date is: 3 days              Patient currently is not medically stable to d/c.   DVT Prophylaxis  :  Lovenox    Lab Results  Component Value Date   PLT 197 01/13/2020    Diet :  Diet Order            Diet regular Room service appropriate? Yes with Assist; Fluid consistency: Thin  Diet effective now                  Inpatient Medications  Scheduled Meds: . vitamin C  500 mg Oral Daily  . Chlorhexidine Gluconate Cloth  6 each Topical Daily  . collagenase   Topical Daily  . enoxaparin (LOVENOX) injection  80 mg Subcutaneous Q12H  . gabapentin  300 mg Oral BID  . insulin aspart  0-15 Units Subcutaneous TID WC  . insulin detemir  5 Units Subcutaneous Daily  . melatonin  3 mg Oral QHS  . oxyCODONE  10 mg Oral Q4H  . potassium chloride  20 mEq Oral Daily  . zinc sulfate  220 mg Oral Daily   Continuous Infusions: . 0.9 % NaCl with KCl 40 mEq / L 100 mL/hr at 01/13/20 0555   PRN Meds:.acetaminophen (TYLENOL) oral liquid 160 mg/5 mL, guaiFENesin, lip balm, zolpidem  Antibiotics  :    Anti-infectives (From admission, onward)   Start     Dose/Rate Route Frequency Ordered Stop   12/21/19 0900  vancomycin (VANCOREADY) IVPB 2000 mg/400 mL        2,000 mg 200 mL/hr over 120 Minutes Intravenous Every 24 hours 12/20/19 0845 12/24/19 1156   12/20/19 0900  vancomycin (VANCOCIN) 2,500 mg in sodium chloride 0.9 % 500 mL IVPB        2,500 mg 250 mL/hr over 120 Minutes Intravenous  Once 12/20/19 0845 12/20/19 1237   12/14/19 0000  piperacillin-tazobactam (ZOSYN) IVPB 3.375 g        3.375 g 12.5 mL/hr over 240 Minutes Intravenous Every 8 hours 12/13/19 1652 12/19/19 1953   12/13/19 1700   piperacillin-tazobactam (ZOSYN) IVPB 3.375 g  3.375 g 100 mL/hr over 30 Minutes Intravenous  Once 12/13/19 1651 12/13/19 1741   12/10/19 0100  ceFEPIme (MAXIPIME) 2 g in sodium chloride 0.9 % 100 mL IVPB  Status:  Discontinued        2 g 200 mL/hr over 30 Minutes Intravenous Every 8 hours 12/09/19 2044 12/12/19 0942   12/09/19 2200  ceftaroline (TEFLARO) 600 mg in sodium chloride 0.9 % 250 mL IVPB  Status:  Discontinued        600 mg 250 mL/hr over 60 Minutes Intravenous Every 12 hours 12/09/19 2023 12/09/19 2024   12/09/19 2200  ceftaroline (TEFLARO) 600 mg in sodium chloride 0.9 % 250 mL IVPB  Status:  Discontinued       Note to Pharmacy: Pharmacy may modify dose for renal failure.   600 mg 250 mL/hr over 60 Minutes Intravenous Every 12 hours 12/09/19 2024 12/09/19 2044   12/09/19 2130  linezolid (ZYVOX) IVPB 600 mg        600 mg 300 mL/hr over 60 Minutes Intravenous Every 12 hours 12/09/19 2044 12/15/19 2349   12/09/19 0900  ceFEPIme (MAXIPIME) 2 g in sodium chloride 0.9 % 100 mL IVPB  Status:  Discontinued        2 g 200 mL/hr over 30 Minutes Intravenous Every 8 hours 12/09/19 0836 12/09/19 2023   12/05/19 1000  remdesivir 100 mg in sodium chloride 0.9 % 100 mL IVPB        100 mg 200 mL/hr over 30 Minutes Intravenous Daily 12/04/19 1417 12/06/19 1003       Time Spent in minutes  30   Susa Raring M.D on 01/13/2020 at 9:30 AM  To page go to [email or web address] - password [facility]  [facility] -  Office  [phone]     See all Orders from today for further details    Objective:   Vitals:   01/13/20 0536 01/13/20 0623 01/13/20 0725 01/13/20 0751  BP: 125/75     Pulse: (!) 103  99   Resp: 20  (!) 27 20  Temp: 98.5 F (36.9 C)     TempSrc: Oral     SpO2: 100% 98% 99%   Weight:  (!) 157.8 kg    Height:        Wt Readings from Last 3 Encounters:  01/13/20 (!) 157.8 kg  12/04/19 (!) 172 kg  07/26/18 (!) 178.8 kg     Intake/Output Summary (Last 24 hours)  at 01/13/2020 0930 Last data filed at 01/13/2020 0814 Gross per 24 hour  Intake 2765.02 ml  Output 1150 ml  Net 1615.02 ml     Physical Exam  Awake Alert, No new F.N deficits, Normal affect [place].AT,PERRAL Supple Neck,No JVD, No cervical lymphadenopathy appriciated.  Symmetrical Chest wall movement, Good air movement bilaterally, CTAB, Trach collar in place RRR,No Gallops, Rubs or new Murmurs, No Parasternal Heave +ve B.Sounds, Abd Soft, No tenderness, No organomegaly appriciated, No rebound - guarding or rigidity. No Cyanosis, Clubbing or edema, No new Rash or bruise     Data Review:    CBC Recent Labs  Lab 01/08/20 0413 01/11/20 0708 01/12/20 0244 01/13/20 0216  WBC 8.1 7.4 7.7 7.8  HGB 9.5* 10.1* 9.8* 9.6*  HCT 31.6* 33.9* 32.2* 32.0*  PLT 189 209 194 197  MCV 99.7 97.4 95.8 97.3  MCH 30.0 29.0 29.2 29.2  MCHC 30.1 29.8* 30.4 30.0  RDW 14.8 14.6 14.6 14.6  LYMPHSABS  --   --  2.0 2.2  MONOABS  --   --  0.5 0.6  EOSABS  --   --  0.1 0.1  BASOSABS  --   --  0.0 0.0    Chemistries  Recent Labs  Lab 01/07/20 0241 01/07/20 0241 01/08/20 0413 01/09/20 1151 01/12/20 0244 01/12/20 2105 01/13/20 0216  NA 140  --  139 135 137  --  139  K 3.5   < > 3.3* 3.6 2.9* 3.5 3.5  CL 89*  --  88* 92* 98  --  103  CO2 38*  --  38* 31 27  --  25  GLUCOSE 142*  --  144* 157* 130*  --  137*  BUN 50*  --  50* 32* 24*  --  17  CREATININE 1.37*  --  1.31* 1.10 1.30*  --  1.09  CALCIUM 9.4  --  9.3 9.5 9.3  --  8.7*  AST  --   --   --   --  24  --  25  ALT  --   --   --   --  30  --  30  ALKPHOS  --   --   --   --  58  --  57  BILITOT  --   --   --   --  0.3  --  0.4  MG 2.1  --  2.1  --  2.1  --  2.0   < > = values in this interval not displayed.     ------------------------------------------------------------------------------------------------------------------ No results for input(s): CHOL, HDL, LDLCALC, TRIG, CHOLHDL, LDLDIRECT in the last 72 hours.  Lab Results   Component Value Date   HGBA1C 6.6 (H) 12/03/2019   ------------------------------------------------------------------------------------------------------------------ No results for input(s): TSH, T4TOTAL, T3FREE, THYROIDAB in the last 72 hours.  Invalid input(s): FREET3  Cardiac Enzymes No results for input(s): CKMB, TROPONINI, MYOGLOBIN in the last 168 hours.  Invalid input(s): CK ------------------------------------------------------------------------------------------------------------------    Component Value Date/Time   BNP 22.9 01/13/2020 0216    Micro Results No results found for this or any previous visit (from the past 240 hour(s)).  Radiology Reports DG Chest 1 View  Result Date: 01/02/2020 CLINICAL DATA:  Check tracheostomy placement EXAM: CHEST  1 VIEW COMPARISON:  12/28/2019 FINDINGS: Tracheostomy tube is noted in satisfactory position. Feeding catheter is noted extending into the stomach. The lungs are well aerated. Persistent mild left perihilar density is noted improved from the prior exam. No focal bony abnormality is noted. IMPRESSION: Slight improvement in aeration in the left lung. Tubes and lines as described. Electronically Signed   By: Alcide Clever M.D.   On: 01/02/2020 11:41   DG Chest 1 View  Result Date: 12/14/2019 CLINICAL DATA:  COVID-19 positive. EXAM: CHEST  1 VIEW COMPARISON:  Dec 13, 2019 FINDINGS: Diffuse opacity throughout the right upper lobe persists but is less dense in the interval. There is mild opacity in left base, more focal than the right base. The remainder of the left lung is clear. Feeding tube terminates below today's film. The NG tube terminates below today's film. A left central line terminates in the SVC. The ETT is in good position. No other acute abnormalities. IMPRESSION: 1. Support apparatus as above. 2. Diffuse opacity throughout the right lung persists but is less dense in the interval. Bibasilar opacities, left greater than right  are also identified. 3. No other abnormalities. Electronically Signed   By: Gerome Sam III M.D   On: 12/14/2019 12:27   DG Chest Port 1 View  Result Date:  01/05/2020 CLINICAL DATA:  Respiratory failure EXAM: PORTABLE CHEST 1 VIEW COMPARISON:  Three days ago FINDINGS: Tracheostomy tube in place. There is a feeding tube which at least reaches the stomach. Extensive artifact from EKG leads. Low volume chest with hazy opacity likely reflecting atelectasis, improved compared to more remote priors. No visible effusion or pneumothorax. Cardiopericardial enlargement IMPRESSION: Stable low volume chest. Electronically Signed   By: Marnee Spring M.D.   On: 01/05/2020 07:04   DG Chest Port 1 View  Result Date: 12/28/2019 CLINICAL DATA:  Follow-up respiratory distress. EXAM: PORTABLE CHEST 1 VIEW COMPARISON:  12/26/2019 and older studies. FINDINGS: There is opacity in the left perihilar and lower lung which is unchanged from the most recent prior exam. Right lung is clear. No convincing pleural effusion and no pneumothorax. Tracheostomy tube and enteric tube are stable. IMPRESSION: 1. No significant change from the most recent prior study. 2. Left perihilar and lower lung opacity, which may be due to atelectasis or infection. 3. Stable support apparatus. Electronically Signed   By: Amie Portland M.D.   On: 12/28/2019 08:23   DG Chest Port 1 View  Result Date: 12/26/2019 CLINICAL DATA:  Hypertension.  Diabetes.  ARDS EXAM: PORTABLE CHEST 1 VIEW COMPARISON:  Yesterday FINDINGS: Tracheostomy tube in place.  The feeding tube reaches the stomach. Cardiomegaly with left more than right pulmonary opacity. There could be layering pleural fluid. No visible pneumothorax. IMPRESSION: 1. Unchanged hardware positioning. 2. Vascular congestion and more asymmetric left-sided pulmonary opacity today. Electronically Signed   By: Marnee Spring M.D.   On: 12/26/2019 08:15   DG Chest Port 1 View  Result Date:  12/25/2019 CLINICAL DATA:  Acute respiratory failure EXAM: PORTABLE CHEST 1 VIEW COMPARISON:  Three days ago FINDINGS: Increased hazy and interstitial opacity on both sides with cephalized blood flow. Cardiomegaly and vascular pedicle widening. Tracheostomy tube in place. Feeding tube that at least reaches the stomach. Artifact from soft tissue attenuation and hardware. IMPRESSION: Increased vascular congestion/edema. Electronically Signed   By: Marnee Spring M.D.   On: 12/25/2019 08:48   DG Chest Port 1 View  Result Date: 12/23/2019 CLINICAL DATA:  COVID-19 positive, respiratory failure EXAM: PORTABLE CHEST 1 VIEW COMPARISON:  Chest radiograph from one day prior. FINDINGS: Tracheostomy tube tip is well positioned over the tracheal air column at the thoracic inlet. Enteric tube enters stomach with the tip not seen on this image. Left internal jugular central venous catheter terminates over the left brachiocephalic vein near the junction with the SVC. Stable cardiomediastinal silhouette with top-normal heart size. No pneumothorax. Possible stable small left pleural effusion. No right pleural effusion. Patchy parahilar and bibasilar lung opacities appear worsened at the left lung base. IMPRESSION: 1. Well-positioned support structures. No pneumothorax. 2. Patchy parahilar and bibasilar lung opacities, worsened at the left lung base, probably increased atelectasis on a background of pneumonia. Electronically Signed   By: Delbert Phenix M.D.   On: 12/23/2019 05:19   DG Chest Port 1 View  Result Date: 12/22/2019 CLINICAL DATA:  COVID-19 positive, acute respiratory failure EXAM: PORTABLE CHEST 1 VIEW COMPARISON:  Chest radiograph from one day prior. FINDINGS: Tracheostomy tube tip overlies the tracheal air column at the thoracic inlet. Enteric tube enters stomach with the tip not seen on this image. Stable cardiomediastinal silhouette with top-normal heart size. No pneumothorax. No pleural effusion. Patchy  opacities throughout both lungs, similar, with overall improved lung volumes. IMPRESSION: Patchy opacities throughout both lungs, similar, with overall improved lung volumes,  compatible with COVID-19 pneumonia. Electronically Signed   By: Delbert Phenix M.D.   On: 12/22/2019 05:30   DG Chest Port 1 View  Result Date: 12/21/2019 CLINICAL DATA:  COVID-19 positive.  Acute respiratory failure. EXAM: PORTABLE CHEST 1 VIEW COMPARISON:  Chest radiograph from one day prior. FINDINGS: Tracheostomy tube tip overlies the tracheal air column is below the thoracic inlet. Enteric tube enters stomach with the tip not seen on this image. Left internal jugular central venous catheter terminates over the left brachiocephalic vein. Stable cardiomediastinal silhouette with top-normal heart size. No pneumothorax. No right pleural effusion. Possible stable small left pleural effusion. Hazy patchy opacities throughout both lungs, similar, with improved aeration in the upper left lung. IMPRESSION: Well-positioned support structures.  No pneumothorax. Improving left upper lung aeration. Persistent hazy patchy opacities throughout both lungs, compatible with COVID-19 pneumonia and atelectasis. Possible stable small left pleural effusion. Electronically Signed   By: Delbert Phenix M.D.   On: 12/21/2019 07:51   DG Chest Port 1 View  Result Date: 12/20/2019 CLINICAL DATA:  Tracheostomy placement. EXAM: PORTABLE CHEST 1 VIEW COMPARISON:  October 19, 2019. FINDINGS: Stable cardiomegaly. Tracheostomy and feeding tubes are in good position. No pneumothorax is noted. Large left lung opacity is noted concerning for pneumonia or atelectasis with associated pleural effusion. Right basilar atelectasis or infiltrate is noted. Bony thorax is unremarkable. IMPRESSION: Tracheostomy and feeding tubes are in good position. Large left lung opacity is noted concerning for pneumonia or atelectasis with associated pleural effusion. Right basilar atelectasis or  infiltrate is noted. Electronically Signed   By: Lupita Raider M.D.   On: 12/20/2019 12:57   DG CHEST PORT 1 VIEW  Result Date: 12/19/2019 CLINICAL DATA:  Acute respiratory failure. EXAM: PORTABLE CHEST 1 VIEW COMPARISON:  12/17/2019. FINDINGS: Endotracheal tube, left IJ line, feeding tube in stable position. NG tube noted with tip below left hemidiaphragm. Previously identified loop in the NG tube is not identified however the pharyngeal region is not imaged. Cardiomegaly with pulmonary venous congestion and bilateral interstitial prominence. Findings consistent CHF. Small left pleural effusion. Pleural effusion is decreased in size from prior exam. No pneumothorax. Thoracic spine scoliosis. IMPRESSION: 1. Endotracheal tube, left IJ line, feeding tube stable position. NG tube noted with tip below left hemidiaphragm. Previously identified looped in the NG tube is not identified however the pharyngeal region is not imaged. 2. Findings consistent congestive heart failure bilateral interstitial edema. Small left pleural effusion. Left pleural effusion is decreased in size from prior exam. Electronically Signed   By: Maisie Fus  Register   On: 12/19/2019 08:14   DG Chest Port 1 View  Result Date: 12/17/2019 CLINICAL DATA:  History of COVID-19 positivity, a recently reposition to supine from prone EXAM: PORTABLE CHEST 1 VIEW COMPARISON:  12/16/2019 FINDINGS: Cardiac shadow is stable. Endotracheal tube and feeding catheter are seen. Gastric catheter extends into the stomach although is looped within the neck. Right lung remains clear. Persistent consolidation is noted on the left. IMPRESSION: Stable appearance of the chest when compared with the prior day. The gastric catheter is looped within the pharynx and could be withdrawn slightly as clinically indicated. Electronically Signed   By: Alcide Clever M.D.   On: 12/17/2019 12:30   DG CHEST PORT 1 VIEW  Result Date: 12/16/2019 CLINICAL DATA:  Hypoxia EXAM:  PORTABLE CHEST 1 VIEW COMPARISON:  Dec 16, 2019 study obtained earlier in the day FINDINGS: Endotracheal tube tip is 3.8 cm above the carina. Nasogastric tube tip  and side port are below the diaphragm. Left jugular catheter tip is in the left innominate vein, stable. No pneumothorax. There is airspace opacity throughout the left lung with left pleural effusion. There is ill-defined opacity in the medial right base. Right lung is otherwise clear. Heart is mildly enlarged with pulmonary vascularity normal. No adenopathy. No bone lesions. IMPRESSION: Tube and catheter positions as described without pneumothorax. Persistent consolidation and airspace opacity throughout most of the left lung with small left pleural effusion, stable from earlier in the day. Ill-defined airspace opacity right base, likely a focus of pneumonia in this area as well. Stable cardiac silhouette. Electronically Signed   By: Lowella Grip III M.D.   On: 12/16/2019 12:11   DG Chest Port 1 View  Result Date: 12/16/2019 CLINICAL DATA:  COVID-19 positive EXAM: PORTABLE CHEST 1 VIEW COMPARISON:  Radiograph 12/15/2019, CT 04/26/2018 FINDINGS: *Endotracheal tube in the mid trachea. *Transesophageal feeding and some tubes terminate below the level of imaging, terminating beyond the GE junction. *Left IJ approach insert venous catheter tip terminates in the brachiocephalic vein just proximal to the brachiocephalic-caval confluence. There is markedly increased opacification of the left hemithorax with now complete opacification of the left lung with some associated air bronchograms. There is partial atelectatic collapse of the right upper lobe with some additional patchy airspace disease in the right upper lung as well. Difficult to exclude a left effusion. No visible right effusion the portion of the costophrenic sulcus is collimated. No pneumothorax is evident. Cardiomediastinal contours are largely obscured. No acute osseous or soft tissue  abnormality. IMPRESSION: 1. Markedly increased opacification of the left hemithorax with now complete opacification of the left hemithorax with some associated air bronchograms. Findings are compatible with worsening consolidation and/or collapse. 2. Partial atelectatic collapse of the right upper lobe with some additional patchy airspace disease in the right upper lung as well. 3. Lines and tubes as above. Electronically Signed   By: Lovena Le M.D.   On: 12/16/2019 06:21   DG Chest Port 1 View  Result Date: 12/15/2019 CLINICAL DATA:  Supine positioning in COVID-19 patient EXAM: PORTABLE CHEST 1 VIEW COMPARISON:  Film from earlier in the same day. FINDINGS: Endotracheal tube is seen in satisfactory position. Gastric catheter and left jugular central line are noted. There is improved aeration within the lungs bilaterally although persistent opacity is noted primarily within the right upper lobe and throughout the left lung. No sizable effusion is seen. No bony abnormality is noted. IMPRESSION: Improved aeration in supine positioning. Persistent airspace opacities are again noted bilaterally however. Tubes and lines as described. Electronically Signed   By: Inez Catalina M.D.   On: 12/15/2019 10:38   DG CHEST PORT 1 VIEW  Result Date: 12/15/2019 CLINICAL DATA:  Endotracheal tube placement. EXAM: PORTABLE CHEST 1 VIEW COMPARISON:  Dec 14, 2019 FINDINGS: The study is nearly nondiagnostic. The endotracheal tube may terminate at the level of the thoracic inlet, however adequate detection is severely limited on this study. The enteric tube appears to extend below the left hemidiaphragm. There is a left-sided central venous catheter. There is diffuse opacification of both lung fields which appears to progressed since the prior study. IMPRESSION: 1. Very limited study. 2. It appears that the endotracheal tube terminates at the thoracic inlet, however exact positioning is difficult to determine on this study.  Consider short repeat interval exam for further evaluation. 3. Worsening interval airspace opacities bilaterally. Electronically Signed   By: Jamie Kato.D.  On: 12/15/2019 02:39

## 2020-01-13 NOTE — Consult Note (Signed)
WOC Nurse wound follow up Patient receiving care in [facility] 5W37. Wound type: sacral/coccyx wound cleaning up nicely with hydrotherapy and santyl Measurement: please see PTs notes for all wound details. Wound bed: Drainage (amount, consistency, odor)  Periwound: Dressing procedure/placement/frequency: Continue current treatment.  I have added a mattress with low air loss feature for pressure relief. Monitor the wound area(s) for worsening of condition such as: Signs/symptoms of infection,  Increase in size,  Development of or worsening of odor, Development of pain, or increased pain at the affected locations.  Notify the medical team if any of these develop. Helmut Muster, RN, MSN, CWOCN, CNS-BC, pager [phone]

## 2020-01-13 NOTE — Progress Notes (Signed)
Inpatient Rehabilitation Medication Review by a Pharmacist  A complete drug regimen review was completed for this patient to identify any potential clinically significant medication issues.  Clinically significant medication issues were identified:  no  Time spent performing this drug regimen review (minutes):  15 minutes   [name], PharmD, BCPS 01/13/2020 4:25 PM

## 2020-01-13 NOTE — Progress Notes (Signed)
Pt refusing to use the BSC. Pt requesting to ambulate to the bathroom w/o the trach collar. Pt was educated on unit policy about ambulating pts before therapy has assessed them. Pt states " I will walk to the bathroom without the O2, I will be fine." Pt was assisted by the NT and this nurse, pts gait was unsteady ad pt appeared to be SOB. Pt denies SOB. Pt requested to be left alone In the bathroom. Pt was informed about the unit policy, and not leaving pts alone in the bathroom. Pt did not care and wanted his privacy.

## 2020-01-13 NOTE — Progress Notes (Signed)
Inpatient Rehabilitation-Admissions Coordinator   I have received insurance approval and medical clearance from Dr. Thedore Mins for admit to CIR today. Pt notified of bed offer and has accepted. Reviewed insurance benefits letter and consent form. All questions answered. RN and [facility] team notified of plan for admit today.   Please call if questions.   Cheri Rous, OTR/L  Rehab Admissions Coordinator  [phone] 01/13/2020 12:19 PM

## 2020-01-13 NOTE — Progress Notes (Signed)
Physical Therapy Treatment Patient Details Name: Clifford Nichols MRN: [id number] DOB: 06-Oct-1980 Today's Date: 01/13/2020    History of Present Illness 39 year old obese man with hypertension, diabetes, admitted 12/02/19 to [facility] with acute respiratory failure, ARDS from COVID-19 pneumonia, initial positive test 5/10. Intubated 5/11, required paralytics, proning, pressors, tracheostomy 5/28; Head CT 5/12 no abnormality. 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  Continues to fatigue easily and require chair follow.  Family had brought in high top shoes for ankle stability; however, still limited stability due to weakness and unable to secure shoes tight enough around ankle.  May need to consider AFOs if not improved - discussed with family and pt (pt going to CIR at d/c - recommend continued assessment).   Follow Up Recommendations  CIR     Equipment Recommendations  Other (comment);3in1 (PT) (bariatric RW; to be further assessed next venue)    Recommendations for Other Services       Precautions / Restrictions Precautions Precautions: Fall Precaution Comments: watch RR, HR, sats    Mobility  Bed Mobility Overal bed mobility: Needs Assistance Bed Mobility: Sidelying to Sit   Sidelying to sit: Min guard          Transfers Overall transfer level: Needs assistance Equipment used: Rolling walker (2 wheeled) Transfers: Sit to/from Stand Sit to Stand: Min assist         General transfer comment: needs stability assist due to his limited ankle/foot function; sit to stand x 4  Ambulation/Gait Ambulation/Gait assistance: Min assist;+2 safety/equipment Gait Distance (Feet): 50 Feet (25', 40', 50') Assistive device: Rolling walker (2 wheeled) Gait Pattern/deviations: Step-through pattern;Decreased stride length;Decreased  dorsiflexion - right;Decreased dorsiflexion - left;Steppage Gait velocity: slower   General Gait Details: Close chair follow for safety; 2 seated rest breaks; Continues to have foot drop and ankle instability requiring increased hip flexion for foot clearance.  Family had brought in pair of high top boots to provide ankle stability but provided little extra support due to too small to securly tighten around ankle.  Discussed may benefit from AFOs if not improved.    Stairs             Wheelchair Mobility    Modified Rankin (Stroke Patients Only)       Balance Overall balance assessment: Needs assistance Sitting-balance support: No upper extremity supported;Feet supported Sitting balance-Leahy Scale: Good       Standing balance-Leahy Scale: Fair Standing balance comment: Require use of UE; fatigued easily; unable to tolerate challenges.  Did stand at sink and brush teeth but required use of UE and leaning on counter                            Cognition Arousal/Alertness: Awake/alert Behavior During Therapy: WFL for tasks assessed/performed Overall Cognitive Status: Within Functional Limits for tasks assessed                                        Exercises General Exercises - Lower Extremity Ankle Circles/Pumps: PROM;Both;10 reps;Supine Long Arc Quad: AROM;Both;10 reps;Seated    General Comments General comments (skin integrity, edema, etc.): O2 sats stable with PMV on RA.  HR max 135 bpm with ambulaiton  Pertinent Vitals/Pain Pain Assessment: Faces Faces Pain Scale: Hurts a little bit Pain Location: feet, then sacral Pain Descriptors / Indicators: Discomfort;Pins and needles;Burning Pain Intervention(s): Limited activity within patient's tolerance;Monitored during session    Home Living                      Prior Function            PT Goals (current goals can now be found in the care plan section) Acute Rehab PT  Goals Patient Stated Goal: get stronger, get to go to rehab PT Goal Formulation: With patient Time For Goal Achievement: 01/15/20 Potential to Achieve Goals: Good Progress towards PT goals: Progressing toward goals    Frequency    Min 4X/week      PT Plan Current plan remains appropriate    Co-evaluation PT/OT/SLP Co-Evaluation/Treatment: Yes Reason for Co-Treatment: Complexity of the patient's impairments (multi-system involvement);For patient/therapist safety PT goals addressed during session: Mobility/safety with mobility OT goals addressed during session: ADL's and self-care      AM-PAC PT "6 Clicks" Mobility   Outcome Measure  Help needed turning from your back to your side while in a flat bed without using bedrails?: A Little Help needed moving from lying on your back to sitting on the side of a flat bed without using bedrails?: A Little Help needed moving to and from a bed to a chair (including a wheelchair)?: A Little   Help needed to walk in hospital room?: A Lot Help needed climbing 3-5 steps with a railing? : Total 6 Click Score: 12    End of Session Equipment Utilized During Treatment: Gait belt Activity Tolerance: Patient tolerated treatment well Patient left: with call bell/phone within reach;with family/visitor present;in chair Nurse Communication: Mobility status PT Visit Diagnosis: Unsteadiness on feet (R26.81);Other abnormalities of gait and mobility (R26.89);Muscle weakness (generalized) (M62.81);Difficulty in walking, not elsewhere classified (R26.2)     Time: 2703-5009 PT Time Calculation (min) (ACUTE ONLY): 40 min  Charges:  $Gait Training: 8-22 mins $Therapeutic Activity: 8-22 mins                     Anise Salvo, PT Acute Rehab Services Pager [phone] [facility] [phone]     Rayetta Humphrey 01/13/2020, 3:23 PM

## 2020-01-13 NOTE — Progress Notes (Signed)
[name], MD  Physician  Physical Medicine and Rehabilitation  PMR Pre-admission      Signed  Date of Service:  01/01/2020  2:29 PM      Related encounter: Admission (Discharged) from 12/04/2019 in [facility]      Signed       Show:Clear all _0 Manual_1 Template_2 Copied  Added by: _3 Raechel Ache, OT_4 [name], MD  _5 Hover for details PMR Admission Coordinator Pre-Admission Assessment   Patient: Clifford Nichols is an 39 y.o., male MRN: [id number] DOB: 01/30/1981 Height: _6  (170.2 cm) Weight: (!) 157.8 kg   Insurance Information HMO:     PPO: yes     PCP:      IPA:      80/20:      OTHER:  PRIMARY: [employer]      Policy#: [id number]      Subscriber: patient CM Name: Heywood Iles via automated fax      Phone#: [phone]     Fax#: [phone] Pre-Cert#: [id number]      Employer:  Josem Kaufmann provided by Heywood Iles via fax for admit to CIR on 6/21. Pt is approved for 7 days with service from 6/21-6/28. Clinical updates are due to Heywood Iles on 6/28 at (f): [phone] with "attention to auth # [id number]"  (p): [phone] Benefits:  Phone #: [phone]     Name: Jenny Reichmann ref #[id number] Eff. Date: 07/26/19-still active      Deduct: $0 (doesn't have deductible)      Out of Pocket Max: $2,500 ($0 met)      Life Max: NA CIR: $750 co-pay/admission      SNF: 80% coverage, 20% co-insurance; with a limit of 120 days/cal yr Outpatient: $40 co-pay/visit      Home Health: 80% coverage, 20% co-insurance      DME: 80% coverage, 20% co-insurance     Providers:  SECONDARY: BCBS non-partiticapting       Policy#: [id number]  [id number]   Phone#: [phone]   Financial Counselor:       Phone#:    The "Data Collection Information Summary" for patients in Inpatient Rehabilitation Facilities with attached "Privacy Act [place] Records" was provided and verbally reviewed with: N/A   Emergency Contact Information         Contact  Information     Name Relation Home Work [place], [place] Spouse [phone]   [phone]    Corlis Leak Father [phone]        Emi Holes Mother [phone]   [phone]         Current Medical History  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 Pt was transferred to [facility] on 12/04/19 for a diagnosis of severe acute hypoxic respiratory failure due to COVID-19 PNA and ARDS. Pt required paralytics and proning. Pt developed new tongue wound due to bite block which led to necrosis; this issue has since resolved after supportive care. Pt continued with daily fevers. His septic shock with bacterial infection has been resolved and pt currently not on antibiotics. Pt required tracheostomy on 5/28 and was eventually extubated on 12/30/19. Pt came off of airborne isolation for COVID-19 on 6/1. Pt intermittantly required assist from CPAP at nighttime due to desaturations but has remained off since 6/18. Recommendations remain for sleep  study due to severe OSA. Pt continues to have overall debility from hospital stay and CIR has been recommended. The patient is to admit to CIR on 01/13/20.   Patient's medical record from [facility] has been reviewed by the rehabilitation admission coordinator and physician.   Past Medical History      Past Medical History:  Diagnosis Date  . Abscess of upper back excluding scapular region    . Diabetes mellitus without complication ([place])    . GERD (gastroesophageal reflux disease)      OCC-NO MEDS  . Hypertension    . Sebaceous cyst        Family History   family history includes Hypertension in his father.   Prior Rehab/Hospitalizations Has the patient had prior rehab or hospitalizations prior to admission? No   Has the  patient had major surgery during 100 days prior to admission? No               Current Medications   Current Facility-Administered Medications:  .  0.9 % NaCl with KCl 40 mEq / L  infusion, , Intravenous, Continuous, Thurnell Lose, MD, Last Rate: 100 mL/hr at 01/13/20 0555, New Bag at 01/13/20 0555 .  acetaminophen (TYLENOL) 160 MG/5ML solution 650 mg, 650 mg, Per Tube, Q6H PRN, Agarwala, Ravi, MD, 650 mg at 01/01/20 0010 .  ascorbic acid (VITAMIN C) tablet 500 mg, 500 mg, Oral, Daily, [place]en[place], Yujing Z, RPH, 500 mg at 01/13/20 0806 .  Chlorhexidine Gluconate Cloth 2 % PADS 6 each, 6 each, Topical, Daily, Kipp Brood, MD, 6 each at 01/12/20 0925 .  collagenase (SANTYL) ointment, , Topical, Daily, Kipp Brood, MD, Given at 01/13/20 [phone] .  enoxaparin (LOVENOX) injection 80 mg, 80 mg, Subcutaneous, Q12H, Thurnell Lose, MD, 80 mg at 01/13/20 0806 .  gabapentin (NEURONTIN) capsule 300 mg, 300 mg, Oral, BID, Merlene Laughter F, NP, 300 mg at 01/13/20 0807 .  guaiFENesin (ROBITUSSIN) 100 MG/5ML solution 100 mg, 5 mL, Oral, Q4H PRN, Agarwala, Ravi, MD, 100 mg at 01/03/20 1956 .  insulin aspart (novoLOG) injection 0-15 Units, 0-15 Units, Subcutaneous, TID WC, Thurnell Lose, MD, 2 Units at 01/13/20 0805 .  insulin detemir (LEVEMIR) injection 5 Units, 5 Units, Subcutaneous, Daily, Thurnell Lose, MD, 5 Units at 01/13/20 0805 .  lip balm (CARMEX) ointment, , Topical, PRN, Agarwala, Ravi, MD .  melatonin tablet 3 mg, 3 mg, Oral, QHS, Agarwala, Ravi, MD, 3 mg at 01/12/20 2100 .  oxyCODONE (Oxy IR/ROXICODONE) immediate release tablet 10 mg, 10 mg, Oral, Q4H, [place]en[place]k, Yujing Z, RPH, 10 mg at 01/13/20 1210 .  potassium chloride SA (KLOR-CON) CR tablet 20 mEq, 20 mEq, Oral, Daily, Thurnell Lose, MD, 20 mEq at 01/13/20 0806 .  zinc sulfate capsule 220 mg, 220 mg, Oral, Daily, [place]k, Yujing Z, RPH, 220 mg at 01/13/20 0805 .  zolpidem (AMBIEN) tablet 5 mg, 5 mg, Per Tube, QHS PRN,  Kipp Brood, MD   Patients Current Diet:     Diet Order                      Diet regular Room service appropriate? Yes with Assist; Fluid consistency: Thin  Diet effective now                      Precautions / Restrictions Precautions Precautions: Fall Precaution Comments: watch RR, HR, sats Restrictions Weight Bearing Restrictions: No    Has the  patient had 2 or more falls or a fall with injury in the past year? No   Prior Activity Level Community (5-7x/wk): pt was independent PTA, worked full time as Presenter, broadcasting at [employer]. drove, active   Prior Functional Level Self Care: Did the patient need help bathing, dressing, using the toilet or eating? Independent   Indoor Mobility: Did the patient need assistance with walking from room to room (with or without device)? Independent   Stairs: Did the patient need assistance with internal or external stairs (with or without device)? Independent   Functional Cognition: Did the patient need help planning regular tasks such as shopping or remembering to take medications? Independent   Home Assistive Devices / Equipment   Prior Device Use: Indicate devices/aids used by the patient prior to current illness, exacerbation or injury? None of the above   Current Functional Level Cognition   Overall Cognitive Status: Within Functional Limits for tasks assessed Difficult to assess due to: Tracheostomy Current Attention Level: Focused, Sustained Orientation Level: Oriented X4 Following Commands: Follows one step commands with increased time General Comments: pt nodding head at times to indicate a yet. pt following 1 step comamnds    Extremity Assessment (includes Sensation/Coordination)   Upper Extremity Assessment: RUE deficits/detail, LUE deficits/detail RUE Deficits / Details: AAROM shoulder flexion, bicep activation, tricep activation, wrist activation. Pt with hook grasp with all attempts to squeeze with hands. pt able to  scapula elevate with weaker depression. Pt with weak attempts to retract scapula  LUE Deficits / Details: AAROM shoulder flexion, bicep activation, tricep activation, wrist activation. Pt with hook grasp with all attempts to squeeze with hands. pt able to scapula elevate with weaker depression. Pt with weak attempts to retract scapula   Lower Extremity Assessment: Defer to PT evaluation RLE Deficits / Details: no activation or ankle or toes this session. pt does demonstrate quad and hamstrings LLE Deficits / Details: no activation of ankle or toes but demonstrates quad and hamstrings     ADLs   Overall ADL's : Needs assistance/impaired Eating/Feeding: NPO Grooming: Wash/dry face, Brushing hair, Minimal assistance, Moderate assistance, Standing Grooming Details (indicate cue type and reason): +2 for safety with standing; min-modA for balance without UE support  Lower Body Dressing: Maximal assistance Lower Body Dressing Details (indicate cue type and reason): pt pushing down on command to help don new tennis shoes Toilet Transfer: +2 for physical assistance, Minimal assistance, RW Toilet Transfer Details (indicate cue type and reason): simulated from recliner. power up with bil Ue  Functional mobility during ADLs: Minimal assistance, Moderate assistance, +2 for safety/equipment, Rolling walker General ADL Comments: pt with steady progress towards OT goals; practicing functional tasks in standing while challenging balance - including standing grooming ADL, donning/doffing gloves, reaching and placing items on table, reaching and reading aloud card from family     Mobility   Overal bed mobility: Needs Assistance Bed Mobility: Sit to Sidelying Rolling: Min guard Sidelying to sit: Min guard Supine to sit: Min guard Sit to supine: Mod assist Sit to sidelying: Mod assist General bed mobility comments: sitting EOB on arrival     Transfers   Overall transfer level: Needs assistance Equipment  used: Rolling walker (2 wheeled) Transfer via Lift Equipment: [place] Transfers: Sit to/from Stand Sit to Stand: [place] assist General transfer comment: needs stability assist due to his limited ankle/foot function.     Ambulation / Gait / Stairs / Wheelchair Mobility   Ambulation/Gait Ambulation/Gait assistance: Min assist, Mod assist, +2  safety/equipment Gait Distance (Feet): 40 Feet (70 feet and 50 feet) Assistive device: Rolling walker (2 wheeled) Gait Pattern/deviations: Step-through pattern, Decreased stride length, Decreased dorsiflexion - right, Decreased dorsiflexion - left General Gait Details: more stability assist needed due to pt with shoes on today making his feet heavier, use of everters to df are weak and drag with swing through and then mildly more unsteady contact. Gait velocity: slower Gait velocity interpretation: <1.8 ft/sec, indicate of risk for recurrent falls     Posture / Balance Dynamic Sitting Balance Sitting balance - Comments: worked at edge of the chair  on sitting balance Balance Overall balance assessment: Needs assistance Sitting-balance support: Bilateral upper extremity supported, No upper extremity supported, Feet supported Sitting balance-Leahy Scale: Fair Sitting balance - Comments: worked at edge of the chair  on sitting balance Standing balance support: Bilateral upper extremity supported, During functional activity Standing balance-Leahy Scale: Poor Standing balance comment: relies on bil UE on RW.  There is little to very weak ankle and foot function.     Special needs/care consideration Continuous Drip IV: 0.9% NaCl with KCl 40 mE1/L infusion   Oxygen: 5L/min (28% FiO2),    Special Bed: air mattress due to sacral wound   Trach size: 60m cuffed   Skin: abrasion to bil breasts, MASD bilateral breasts; left deep tissue pressure injury to tonge, pressure injury to mid sacrum (instageable)   Diabetic management: yes    Special service needs:  hydrotherapy    and Designated visitor: TLevander Campion(wife) and SWilburn Cornelia(mother)    Previous HEnvironmental health practitioner(from acute therapy documentation) Additional Comments: no family present   Discharge Living Setting Plans for Discharge Living Setting: Other (Comment) (will go to his mom's house at [place]) Type of Home at Discharge: House Discharge Home Layout: One level Discharge Home Access: Stairs to enter Entrance Stairs-Rails: Left Entrance Stairs-Number of Steps: 3-4 Discharge Bathroom Shower/Tub: Walk-in shower Discharge Bathroom Toilet: Standard Discharge Bathroom Accessibility: Yes How Accessible: Accessible via walker Does the patient have any problems obtaining your medications?: No   Social/Family/Support Systems Patient Roles: Spouse, Parent, Other (Comment) (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) Contact Information: wife: TLevander [phone] mother (Wilburn Cornelia: [phone] Caregiver: TLevander Campionand SWilburn Cornelia Anticipated Caregiver's Contact Information: see above Ability/Limitations of Caregiver: Min A Caregiver Availability: 24/7 Discharge Plan Discussed with Primary Caregiver: Yes (with pt and his wife TLevander Campion Is Caregiver In Agreement with Plan?: Yes Does Caregiver/Family have Issues with Lodging/Transportation while Pt is in Rehab?: No   Goals Patient/Family Goal for Rehab: PT/OT: Mod I/Supervision; SLP: Mod I/Supervision Expected length of stay: 12-16 days Cultural Considerations: NA Pt/Family Agrees to Admission and willing to participate: Yes Program Orientation Provided & Reviewed with Pt/Caregiver Including Roles  & Responsibilities: Yes(pt and his wife )  Barriers to Discharge: Home environment access/layout, TTherapist, nutritional Nutrition means, New oxygen  Barriers to Discharge Comments: steps to enter home, new to O2, new trach   Decrease burden of Care through IP rehab admission: NA   Possible need for SNF placement upon discharge: Not anticipated. Pt has great  family support at [place] and has a good prognosis for further progress through CIR given his PLOF, age, current progress, and motivation.    Patient Condition: [facility] spoken with RN, and patient and spouse. I met with patient at the bedside for inpatient rehabilitation assessment.  Patient will benefit from ongoing PT, OT and SLP, can actively participate in 3 hours of  therapy a day 5 days of the week, and can make measurable gains during the admission.  Patient will also benefit from the coordinated team approach during an Inpatient Acute Rehabilitation admission.  The patient will receive intensive therapy as well as Rehabilitation physician, nursing, social worker, and care management interventions.  Due to safety, skin/wound care, disease management, medication administration, pain management and patient education the patient requires 24 hour a day rehabilitation nursing.  The patient is currently Min; Mod A +2 for ambulation and Min/Mod A for basic ADLs.  Discharge setting and therapy post discharge at home with home health is anticipated.  Patient has agreed to participate in the Acute Inpatient Rehabilitation Program and will admit 01/13/20.   Preadmission Screen Completed By:  Raechel Ache, 01/13/2020 12:22 PM ______________________________________________________________________   Discussed status with Dr. Letta Pate on 01/13/20 at 12:21PM and received approval for admission today.   Admission Coordinator:  Raechel Ache, OT, time 12:21PM/Date 01/13/20    Assessment/Plan: Diagnosis:Debility post respiratory failure 1. Does the need for close, 24 hr/day Medical supervision in concert with the patient's rehab needs make it unreasonable for this patient to be served in a less intensive setting? Yes 2. Co-Morbidities requiring supervision/potential complications: morbid obesity, HTN, DM 2 3. Due to bladder management, bowel management, safety,  skin/wound care, disease management, medication administration, pain management and patient education, does the patient require 24 hr/day rehab nursing? Yes 4. Does the patient require coordinated care of a physician, rehab nurse, PT, OT, and SLP to address physical and functional deficits in the context of the above medical diagnosis(es)? Yes Addressing deficits in the following areas: balance, endurance, locomotion, strength, transferring, bowel/bladder control, bathing, dressing, feeding, grooming, toileting and psychosocial support 5. Can the patient actively participate in an intensive therapy program of at least 3 hrs of therapy 5 days a week? Yes 6. The potential for patient to make measurable gains while on inpatient rehab is good 7. Anticipated functional outcomes upon discharge from inpatient rehab: modified independent PT, supervision OT, modified independent SLP 8. Estimated rehab length of stay to reach the above functional goals is: 12-16d 9. Anticipated discharge destination: Home 10. Overall Rehab/Functional Prognosis: good     MD Signature: [name] M.D. [facility] FAAPM&R (Neuromuscular Med) Diplomate Am Board of Electrodiagnostic Med Fellow Am Board of Interventional Pain            Revision History                     Note Details  Author [name], MD File Time 01/13/2020  1:40 PM  Author Type Physician Status Signed  Last Editor [name], MD Service Physical Medicine and [place] # [id number] Admit Date 01/13/2020

## 2020-01-13 NOTE — Plan of Care (Signed)
  Problem: RH SAFETY Goal: RH STG ADHERE TO SAFETY PRECAUTIONS W/ASSISTANCE/DEVICE Description: STG Adhere to Safety Precautions With Mod I  Assistance/Device. 01/13/2020 1651 by Marland Mcalpine, RN Outcome: Progressing 01/13/2020 1651 by Marland Mcalpine, RN Outcome: Not Progressing   Problem: RH PAIN MANAGEMENT Goal: RH STG PAIN MANAGED AT OR BELOW PT'S PAIN GOAL Description: Pain goal less than 5 01/13/2020 1651 by Marland Mcalpine, RN Outcome: Progressing 01/13/2020 1651 by Marland Mcalpine, RN Outcome: Not Progressing   Problem: RH KNOWLEDGE DEFICIT GENERAL Goal: RH STG INCREASE KNOWLEDGE OF SELF CARE AFTER HOSPITALIZATION 01/13/2020 1651 by Marland Mcalpine, RN Outcome: Progressing 01/13/2020 1651 by Marland Mcalpine, RN Outcome: Not Progressing

## 2020-01-13 NOTE — Progress Notes (Signed)
Physical Therapy Wound Treatment Patient Details  Name: Clifford Nichols MRN: [id number] Date of Birth: 07/22/1981  Today's Date: 01/13/2020 Time: 1107-1139 Time Calculation (min): 32 min  Subjective  Subjective: Pleasant and agreeable to hydrotherapy Patient and Family Stated Goals: get back to his family Prior Treatments: dressing change, santyl  Pain Score:  Pt reports "it hurts" prior to session beginning, however did not complain of pain throughout treatment. Premedicated hours prior to - unable to get more meds immediately before session but was willing to proceed.   Wound Assessment  Pressure Injury 12/26/19 Sacrum Mid Unstageable - Full thickness tissue loss in which the base of the injury is covered by slough (yellow, tan, gray, green or brown) and/or eschar (tan, brown or black) in the wound bed. (Active)  Dressing Type ABD;Barrier Film (skin prep);Gauze (Comment);Moist to dry 01/13/20 1330  Dressing Changed;Clean;Dry;Intact 01/13/20 1330  Dressing Change Frequency Daily 01/13/20 1330  State of Healing Non-healing 01/13/20 1330  Site / Wound Assessment Pink;Yellow 01/13/20 1330  % Wound base Red or Granulating 20% 01/13/20 1330  % Wound base Yellow/Fibrinous Exudate 80% 01/13/20 1330  % Wound base Black/Eschar 0% 01/13/20 1330  % Wound base Other/Granulation Tissue (Comment) 0% 01/13/20 1330  Peri-wound Assessment Intact 01/13/20 1330  Wound Length (cm) 7.5 cm 01/09/20 1043  Wound Width (cm) 3.8 cm 01/09/20 1043  Wound Depth (cm) 2.8 cm 01/09/20 1043  Wound Surface Area (cm^2) 28.5 cm^2 01/09/20 1043  Wound Volume (cm^3) 79.8 cm^3 01/09/20 1043  Margins Unattached edges (unapproximated) 01/13/20 1330  Drainage Amount Minimal 01/13/20 1330  Drainage Description Serosanguineous 01/13/20 1330  Treatment Debridement (Selective);Hydrotherapy (Pulse lavage);Packing (Saline gauze) 01/13/20 1330   Santyl applied to wound bed prior to applying dressing.    Hydrotherapy Pulsed  lavage therapy - wound location: sacrum Pulsed Lavage with Suction (psi): 12 psi Pulsed Lavage with Suction - Normal Saline Used: 1000 mL Pulsed Lavage Tip: Tip with splash shield Selective Debridement Selective Debridement - Location: sacrum Selective Debridement - Tools Used: Scalpel;Forceps Selective Debridement - Tissue Removed: yellow necrotic tissue   Wound Assessment and Plan  Wound Therapy - Assess/Plan/Recommendations Wound Therapy - Clinical Statement: Pt with an episode of shaking (tremor?) and not verbally responding to therapist when he positioned himself in prone. Therapist initiated roll to supine and pt states he is fine. When therapist inquired about the episode pt did not acknowedge it. Wound appears to be slowly progressing with some yellow tissue still very adherent. This patient will benefit from continued hydrotherapy for selective removal of unviable tissue, to decrease bioburden and promote wound bed healing.  Wound Therapy - Functional Problem List: continues to need mechanical ventilation at night Factors Delaying/Impairing Wound Healing: Multiple medical problems;Immobility Hydrotherapy Plan: Debridement;Dressing change;Patient/family education;Pulsatile lavage with suction Wound Therapy - Frequency: 6X / week Wound Therapy - Follow Up Recommendations: Wound Care Center Wound Plan: see above  Wound Therapy Goals- Improve the function of patient's integumentary system by progressing the wound(s) through the phases of wound healing (inflammation - proliferation - remodeling) by: Decrease Necrotic Tissue to: 25 Decrease Necrotic Tissue - Progress: Progressing toward goal Increase Granulation Tissue to: 75 Increase Granulation Tissue - Progress: Progressing toward goal Goals/treatment plan/discharge plan were made with and agreed upon by patient/family: Yes Time For Goal Achievement: 7 days Wound Therapy - Potential for Goals: Excellent  Goals will be updated until  maximal potential achieved or discharge criteria met.  Discharge criteria: when goals achieved, discharge from hospital, MD decision/surgical intervention, no progress   towards goals, refusal/missing three consecutive treatments without notification or medical reason.  GP      D  01/13/2020, 1:45 PM   , PT, DPT Acute Rehabilitation Services Pager: [phone] Office: [phone]   

## 2020-01-13 NOTE — Progress Notes (Signed)
Occupational Therapy Treatment Patient Details Name: Clifford Nichols MRN: [id number] DOB: 05-Jun-1981 Today's Date: 01/13/2020    History of present illness 39 year old obese man with hypertension, diabetes, admitted 12/02/19 to [facility] with acute respiratory failure, ARDS from COVID-19 pneumonia, initial positive test 5/10. Intubated 5/11, required paralytics, proning, pressors, tracheostomy 5/28; Head CT 5/12 no abnormality. Seen by opthalmology due to left eye proptosis (no intervention recommended). 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 Overall, pt requires MIN A for functional mobility with RW and chair follow. Pt with decreased activity tolerance needing x2 seated rest breaks during household distance functional mobility. Pt requries MIN - MOD A for standing balance during standing ADLs d/t impaired dynamic balance. Continue to recommend CIR, will follow.   Follow Up Recommendations  CIR    Equipment Recommendations  Tub/shower seat;Other (comment) (TBD)    Recommendations for Other Services      Precautions / Restrictions Precautions Precautions: Fall Precaution Comments: watch RR, HR, sats Restrictions Weight Bearing Restrictions: No       Mobility Bed Mobility Overal bed mobility: Needs Assistance Bed Mobility: Sidelying to Sit   Sidelying to sit: Min guard       General bed mobility comments: minguard for safety as pt already in sidelying position upon arrival  Transfers Overall transfer level: Needs assistance Equipment used: Rolling walker (2 wheeled) Transfers: Sit to/from Stand Sit to Stand: Min assist         General transfer comment: needs stability assist due to his limited ankle/foot function; sit to stand x 4    Balance Overall balance assessment: Needs assistance Sitting-balance support: No upper extremity supported;Feet  supported Sitting balance-Leahy Scale: Good     Standing balance support: Bilateral upper extremity supported;During functional activity;No upper extremity supported Standing balance-Leahy Scale: Poor Standing balance comment: Require use of UE; fatigued easily; unable to tolerate challenges.  Did stand at sink and brush teeth but required use of UE and leaning on counter                           ADL either performed or assessed with clinical judgement   ADL Overall ADL's : Needs assistance/impaired     Grooming: Oral care;Standing;Minimal assistance;Moderate assistance Grooming Details (indicate cue type and reason): MIN - MOD A for standing balance during dynamic tasks; +2 for safety         Upper Body Dressing : Minimal assistance;Sitting Upper Body Dressing Details (indicate cue type and reason): to don hospital gown as back side cover Lower Body Dressing: Total assistance;Sitting/lateral leans Lower Body Dressing Details (indicate cue type and reason): to don shoes EOB Toilet Transfer: +2 for physical assistance;Minimal assistance;RW;+2 for safety/equipment;Ambulation Toilet Transfer Details (indicate cue type and reason): simulated via functional mobility with RW         Functional mobility during ADLs: Minimal assistance;+2 for safety/equipment;Rolling walker General ADL Comments: pt continues to make good progress towards OT goals. Pt continues to present with impaired standing balance, decreased activity tolerance and impaired ability to care for self impacting pts ability to engage in BADLs     Vision       Perception     Praxis      Cognition Arousal/Alertness: Awake/alert Behavior During Therapy: [facility] for tasks assessed/performed Overall Cognitive Status: Within Functional Limits for tasks assessed  General Comments: asking appropriate questions about rehab        Exercises General Exercises -  Lower Extremity Ankle Circles/Pumps: PROM;Both;10 reps;Supine Long Arc Quad: AROM;Both;10 reps;Seated   Shoulder Instructions       General Comments O2 sats stable with PMV on RA. HR max 135 bpm with ambulation    Pertinent Vitals/ Pain       Pain Assessment: Faces Faces Pain Scale: Hurts a little bit Pain Location: feet, then sacral Pain Descriptors / Indicators: Discomfort;Pins and needles;Burning Pain Intervention(s): Limited activity within patient's tolerance;Monitored during session  Home Living                                          Prior Functioning/Environment              Frequency  Min 2X/week        Progress Toward Goals  OT Goals(current goals can now be found in the care plan section)  Progress towards OT goals: Progressing toward goals  Acute Rehab OT Goals Patient Stated Goal: get stronger, get to go to rehab OT Goal Formulation: With patient/family Time For Goal Achievement: 01/13/20 Potential to Achieve Goals: Good  Plan Discharge plan remains appropriate;Frequency remains appropriate    Co-evaluation    PT/OT/SLP Co-Evaluation/Treatment: Yes Reason for Co-Treatment: For patient/therapist safety PT goals addressed during session: Mobility/safety with mobility OT goals addressed during session: ADL's and self-care      AM-PAC OT "6 Clicks" Daily Activity     Outcome Measure   Help from another person eating meals?: None Help from another person taking care of personal grooming?: A Lot Help from another person toileting, which includes using toliet, bedpan, or urinal?: A Lot Help from another person bathing (including washing, rinsing, drying)?: A Lot Help from another person to put on and taking off regular upper body clothing?: A Lot Help from another person to put on and taking off regular lower body clothing?: A Lot 6 Click Score: 14    End of Session Equipment Utilized During Treatment: Oxygen;Rolling  walker  OT Visit Diagnosis: Unsteadiness on feet (R26.81);Muscle weakness (generalized) (M62.81)   Activity Tolerance Patient tolerated treatment well   Patient Left in chair;with call bell/phone within reach;with family/visitor present   Nurse Communication          Time: 7741-2878 OT Time Calculation (min): 38 min  Charges: OT General Charges $OT Visit: 1 Visit OT Treatments $Self Care/Home Management : 8-22 mins  Lanier Clam., COTA/L Acute Rehabilitation Services [phone] [place] 01/13/2020, 4:18 PM

## 2020-01-13 NOTE — TOC Progression Note (Signed)
Transition of Care [facility]) - Progression Note    Patient Details  Name: Clifford Nichols MRN: [id number] Date of Birth: 25-Feb-1981  Transition of [facility]) CM/SW Contact  Beckie Busing, RN Phone Number: [phone]  01/13/2020, 3:43 PM  Clinical Narrative:    Patient discharging to CIR. No CM needs. CM will sign off.        Expected Discharge Plan and Services           Expected Discharge Date: 01/13/20                                     Social Determinants of Health (SDOH) Interventions    Readmission Risk Interventions No flowsheet data found.

## 2020-01-13 NOTE — Discharge Summary (Signed)
Clifford Nichols LID:[id number] DOB: 06-01-1981 DOA: 12/04/2019  [facility] [place] date: 12/04/2019  Discharge date: 01/13/2020  Admitted From: Home   Disposition:  CIR   Recommendations for Outpatient Follow-up:   Follow up with PCP in 1-2 weeks  PCP Please obtain BMP/CBC, 2 view CXR in 1week,  (see Discharge instructions)   PCP Please follow up on the following pending results:    Home Health: None   Equipment/Devices: None  Consultations: PCCM Discharge Condition: Stable    CODE STATUS: Full    Diet Recommendation: Heart Healthy Low Carb     CC - SOB   Brief history of present illness from the day of admission and additional interim summary    Brief Narrative  - 39 year old morbidly obese African-American gentleman with history of hypertension, DM type II, admitted to the hospital with severe shortness of breath diagnosed with severe acute hypoxic respiratory failure due to COVID-19 pneumonia and ARDS, required intubation and tracheostomy, was stabilized, placed on trach collar and transferred to my care on 01/11/2020 on day 38 of his hospital stay.  Significant events   5/10 admitted [facility]  5/11 failed HHFNC, BiPAP. Required intubation 5/12 transferred to [facility], evaluated by opthal for proptosis- no intervention required 5/17 Started paralytics, ECMO team consulted Not a candidate 5/19 Started proning 5/23 Off pressors 5/26 Proning stopped, continue paralytics 5/27 New tongue wound 2/2 bite block in place, weaned off ketamine. Daily fevers 5/28 Trach, stop paralytics 6/1 Out of airborne isolation, family visiting, on PSV weans 6/6 Tolerated ATC yesterday for 12 hrs 6/7 remained on ATC 6/11 remains on ATC  Procedures ETT 5/11 >> 5/28 L femoral CVC 5/11 >> 5/17 R  radial art line 5/17 >> 5/28 L IJ CVC 5/17 >> 6/1                                                                 Hospital Course   1. Acute Hypoxic Resp. Failure due to Acute Covid 19 Viral Pneumonitis initially presented with severe sepsis and ARDS -   He had severe disease, required intubation and eventual tracheostomy.  Received treatment under the care of pulmonary critical care, has been stabilized on trach collar, he has finished his COVID-19 treatment under pulmonary critical care and has been transferred to hospitalist service on 01/11/2020 on day 38 of his hospital stay.  Currently stable on trach collar, pulmonary critical care monitoring and following him for that, he is morbidly obese likely has undiagnosed underlying OSA will defer decannulation to pulmonary service.    2.  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  Chronic problem needs outpatient ophthalmology follow-up post discharge.  3.  Septic shock with bacterial infection.  Resolved, treated under critical care service.  Currently not on  antibiotics.  4.  Morbid obesity with BMI of 54.  Follow with PCP for weight loss.  He is deconditioned and has qualified for CIR and would like to go there.  5.  Mild to moderate elevation of D-dimer.  In the setting of morbid obesity, was on moderate dose Lovenox here will defer it to CIR to continue DVT prophylaxis if they deem appropriate.  6.  Tongue bite with necrosis.  Resolved after supportive care under the care of critical care.  No active issue.  7.  Acute toxic and metabolic encephalopathy.  Resolved.  8.  Plantar fasciitis.  Supportive care.  9.  Hypokalemia.  Replaced.    10.  Sacral pressure ulcer.  Unstageable, see wound care notes for details.  Supportive care.    11. DM type II.    On sliding scale only.  Due to steroid use which has been stopped, monitor CBGs before every meal at bedtime with sliding scale.  Lab Results  Component Value Date    HGBA1C 6.6 (H) 12/03/2019     Discharge diagnosis     Active Problems:   HTN (hypertension)   Acute respiratory failure due to COVID-19 ([place])   Hyperglycemia   Diabetes ([place])   COVID-19   ARDS (adult respiratory distress syndrome) (H[facility])   Acute respiratory failure ([facility])   Acute on chronic respiratory failure with hypoxia and hypercapnia ([facility]CC)   Pressure injury of skin   Status post tracheostomy [facility])    Discharge instructions    Discharge Instructions    Discharge wound care:   Complete by: As directed    As per wound care at the receiving facility   Increase activity slowly   Complete by: As directed       Discharge Medications   Allergies as of 01/13/2020   No Known Allergies     Medication List    STOP taking these medications   ascorbic acid 500 MG tablet Commonly known as: VITAMIN C   chlorhexidine gluconate (MEDLINE KIT) 0.12 % solution Commonly known as: PERIDEX   Chlorhexidine Gluconate Cloth 2 % Pads   chlorpheniramine-HYDROcodone 10-8 MG/5ML Suer Commonly known as: TUSSIONEX   dexamethasone 10 MG/ML injection Commonly known as: DECADRON   dexmedetomidine 400 MCG/100ML Soln Commonly known as: PRECEDEX   docusate 50 MG/5ML liquid Commonly known as: COLACE   enoxaparin 120 MG/0.8ML injection Commonly known as: LOVENOX   famotidine 20-0.9 MG/50ML-% Commonly known as: PEPCID   fentaNYL 10 mcg/ml Soln infusion   fentaNYL Soln Commonly known as: SUBLIMAZE   guaiFENesin-dextromethorphan 100-10 MG/5ML syrup Commonly known as: ROBITUSSIN DM   Ipratropium-Albuterol 20-100 MCG/ACT Aers respimat Commonly known as: COMBIVENT   ivermectin 3 MG Tabs tablet Commonly known as: STROMECTOL   midazolam 2 MG/2ML Soln injection Commonly known as: VERSED   mouth rinse Liqd solution   norepinephrine 16-5 MG/250ML-% Soln Commonly known as: LEVOPHED   norepinephrine 4-5 MG/250ML-% Soln Commonly known as: LEVOPHED   ondansetron 4 MG  tablet Commonly known as: ZOFRAN   polyethylene glycol 17 g packet Commonly known as: MIRALAX / GLYCOLAX   propofol 1000 MG/100ML Emul injection Commonly known as: DIPRIVAN   sodium chloride flush 0.9 % Soln Commonly known as: NS   vecuronium 10 MG injection Commonly known as: NORCURON   zinc sulfate 220 (50 Zn) MG capsule     TAKE these medications   gabapentin 300 MG capsule Commonly known as: NEURONTIN Take 1 capsule (300 mg total) by mouth 3 (three) times daily.  insulin aspart 100 UNIT/ML injection Commonly known as: NovoLOG Substitute to any brand approved.Before each meal 3 times a day, 140-199 - 2 units, 200-250 - 4 units, 251-299 - 6 units,  300-349 - 8 units,  350 or above 10 units. Dispense syringes and needles as needed, Ok to switch to PEN if approved. DX DM2, Code E11.65 What changed:   how much to take  how to take this  when to take this  additional instructions   pantoprazole 40 MG tablet Commonly known as: Protonix Take 1 tablet (40 mg total) by mouth daily.            Discharge Care Instructions  (From admission, onward)         Start     Ordered   01/13/20 0000  Discharge wound care:       Comments: As per wound care at the receiving facility   01/13/20 [place], [place]. Schedule an appointment as soon as possible for a visit in 1 week(s).   Specialty: General Practice Contact information: [address] [place] [address] [phone]               Major procedures and Radiology Reports - PLEASE review detailed and final reports thoroughly  -        DG Chest 1 View  Result Date: 01/02/2020 CLINICAL DATA:  Check tracheostomy placement EXAM: CHEST  1 VIEW COMPARISON:  12/28/2019 FINDINGS: Tracheostomy tube is noted in satisfactory position. Feeding catheter is noted extending into the stomach. The lungs are well aerated. Persistent mild left  perihilar density is noted improved from the prior exam. No focal bony abnormality is noted. IMPRESSION: Slight improvement in aeration in the left lung. Tubes and lines as described. Electronically Signed   By: Inez Catalina M.D.   On: 01/02/2020 11:41   DG Chest Port 1 View  Result Date: 01/05/2020 CLINICAL DATA:  Respiratory failure EXAM: PORTABLE CHEST 1 VIEW COMPARISON:  Three days ago FINDINGS: Tracheostomy tube in place. There is a feeding tube which at least reaches the stomach. Extensive artifact from EKG leads. Low volume chest with hazy opacity likely reflecting atelectasis, improved compared to more remote priors. No visible effusion or pneumothorax. Cardiopericardial enlargement IMPRESSION: Stable low volume chest. Electronically Signed   By: Monte Fantasia M.D.   On: 01/05/2020 07:04   DG Chest Port 1 View  Result Date: 12/28/2019 CLINICAL DATA:  Follow-up respiratory distress. EXAM: PORTABLE CHEST 1 VIEW COMPARISON:  12/26/2019 and older studies. FINDINGS: There is opacity in the left perihilar and lower lung which is unchanged from the most recent prior exam. Right lung is clear. No convincing pleural effusion and no pneumothorax. Tracheostomy tube and enteric tube are stable. IMPRESSION: 1. No significant change from the most recent prior study. 2. Left perihilar and lower lung opacity, which may be due to atelectasis or infection. 3. Stable support apparatus. Electronically Signed   By: Lajean Manes M.D.   On: 12/28/2019 08:23   DG Chest Port 1 View  Result Date: 12/26/2019 CLINICAL DATA:  Hypertension.  Diabetes.  ARDS EXAM: PORTABLE CHEST 1 VIEW COMPARISON:  Yesterday FINDINGS: Tracheostomy tube in place.  The feeding tube reaches the stomach. Cardiomegaly with left more than right pulmonary opacity. There could be layering pleural fluid. No visible pneumothorax. IMPRESSION: 1. Unchanged hardware positioning. 2. Vascular congestion and more asymmetric left-sided  pulmonary opacity  today. Electronically Signed   By: Monte Fantasia M.D.   On: 12/26/2019 08:15   DG Chest Port 1 View  Result Date: 12/25/2019 CLINICAL DATA:  Acute respiratory failure EXAM: PORTABLE CHEST 1 VIEW COMPARISON:  Three days ago FINDINGS: Increased hazy and interstitial opacity on both sides with cephalized blood flow. Cardiomegaly and vascular pedicle widening. Tracheostomy tube in place. Feeding tube that at least reaches the stomach. Artifact from soft tissue attenuation and hardware. IMPRESSION: Increased vascular congestion/edema. Electronically Signed   By: Monte Fantasia M.D.   On: 12/25/2019 08:48   DG Chest Port 1 View  Result Date: 12/23/2019 CLINICAL DATA:  COVID-19 positive, respiratory failure EXAM: PORTABLE CHEST 1 VIEW COMPARISON:  Chest radiograph from one day prior. FINDINGS: Tracheostomy tube tip is well positioned over the tracheal air column at the thoracic inlet. Enteric tube enters stomach with the tip not seen on this image. Left internal jugular central venous catheter terminates over the left brachiocephalic vein near the junction with the SVC. Stable cardiomediastinal silhouette with top-normal heart size. No pneumothorax. Possible stable small left pleural effusion. No right pleural effusion. Patchy parahilar and bibasilar lung opacities appear worsened at the left lung base. IMPRESSION: 1. Well-positioned support structures. No pneumothorax. 2. Patchy parahilar and bibasilar lung opacities, worsened at the left lung base, probably increased atelectasis on a background of pneumonia. Electronically Signed   By: Ilona Sorrel M.D.   On: 12/23/2019 05:19   DG Chest Port 1 View  Result Date: 12/22/2019 CLINICAL DATA:  COVID-19 positive, acute respiratory failure EXAM: PORTABLE CHEST 1 VIEW COMPARISON:  Chest radiograph from one day prior. FINDINGS: Tracheostomy tube tip overlies the tracheal air column at the thoracic inlet. Enteric tube enters stomach with the tip not seen on this  image. Stable cardiomediastinal silhouette with top-normal heart size. No pneumothorax. No pleural effusion. Patchy opacities throughout both lungs, similar, with overall improved lung volumes. IMPRESSION: Patchy opacities throughout both lungs, similar, with overall improved lung volumes, compatible with COVID-19 pneumonia. Electronically Signed   By: Ilona Sorrel M.D.   On: 12/22/2019 05:30   DG Chest Port 1 View  Result Date: 12/21/2019 CLINICAL DATA:  COVID-19 positive.  Acute respiratory failure. EXAM: PORTABLE CHEST 1 VIEW COMPARISON:  Chest radiograph from one day prior. FINDINGS: Tracheostomy tube tip overlies the tracheal air column is below the thoracic inlet. Enteric tube enters stomach with the tip not seen on this image. Left internal jugular central venous catheter terminates over the left brachiocephalic vein. Stable cardiomediastinal silhouette with top-normal heart size. No pneumothorax. No right pleural effusion. Possible stable small left pleural effusion. Hazy patchy opacities throughout both lungs, similar, with improved aeration in the upper left lung. IMPRESSION: Well-positioned support structures.  No pneumothorax. Improving left upper lung aeration. Persistent hazy patchy opacities throughout both lungs, compatible with COVID-19 pneumonia and atelectasis. Possible stable small left pleural effusion. Electronically Signed   By: Ilona Sorrel M.D.   On: 12/21/2019 07:51   DG Chest Port 1 View  Result Date: 12/20/2019 CLINICAL DATA:  Tracheostomy placement. EXAM: PORTABLE CHEST 1 VIEW COMPARISON:  October 19, 2019. FINDINGS: Stable cardiomegaly. Tracheostomy and feeding tubes are in good position. No pneumothorax is noted. Large left lung opacity is noted concerning for pneumonia or atelectasis with associated pleural effusion. Right basilar atelectasis or infiltrate is noted. Bony thorax is unremarkable. IMPRESSION: Tracheostomy and feeding tubes are in good position. Large left lung  opacity is noted concerning for pneumonia or atelectasis  with associated pleural effusion. Right basilar atelectasis or infiltrate is noted. Electronically Signed   By: Marijo Conception M.D.   On: 12/20/2019 12:57   DG CHEST PORT 1 VIEW  Result Date: 12/19/2019 CLINICAL DATA:  Acute respiratory failure. EXAM: PORTABLE CHEST 1 VIEW COMPARISON:  12/17/2019. FINDINGS: Endotracheal tube, left IJ line, feeding tube in stable position. NG tube noted with tip below left hemidiaphragm. Previously identified loop in the NG tube is not identified however the pharyngeal region is not imaged. Cardiomegaly with pulmonary venous congestion and bilateral interstitial prominence. Findings consistent CHF. Small left pleural effusion. Pleural effusion is decreased in size from prior exam. No pneumothorax. Thoracic spine scoliosis. IMPRESSION: 1. Endotracheal tube, left IJ line, feeding tube stable position. NG tube noted with tip below left hemidiaphragm. Previously identified looped in the NG tube is not identified however the pharyngeal region is not imaged. 2. Findings consistent congestive heart failure bilateral interstitial edema. Small left pleural effusion. Left pleural effusion is decreased in size from prior exam. Electronically Signed   By: Marcello Moores  Register   On: 12/19/2019 08:14   DG Chest Port 1 View  Result Date: 12/17/2019 CLINICAL DATA:  History of COVID-19 positivity, a recently reposition to supine from prone EXAM: PORTABLE CHEST 1 VIEW COMPARISON:  12/16/2019 FINDINGS: Cardiac shadow is stable. Endotracheal tube and feeding catheter are seen. Gastric catheter extends into the stomach although is looped within the neck. Right lung remains clear. Persistent consolidation is noted on the left. IMPRESSION: Stable appearance of the chest when compared with the prior day. The gastric catheter is looped within the pharynx and could be withdrawn slightly as clinically indicated. Electronically Signed   By: Inez Catalina M.D.   On: 12/17/2019 12:30   DG CHEST PORT 1 VIEW  Result Date: 12/16/2019 CLINICAL DATA:  Hypoxia EXAM: PORTABLE CHEST 1 VIEW COMPARISON:  Dec 16, 2019 study obtained earlier in the day FINDINGS: Endotracheal tube tip is 3.8 cm above the carina. Nasogastric tube tip and side port are below the diaphragm. Left jugular catheter tip is in the left innominate vein, stable. No pneumothorax. There is airspace opacity throughout the left lung with left pleural effusion. There is ill-defined opacity in the medial right base. Right lung is otherwise clear. Heart is mildly enlarged with pulmonary vascularity normal. No adenopathy. No bone lesions. IMPRESSION: Tube and catheter positions as described without pneumothorax. Persistent consolidation and airspace opacity throughout most of the left lung with small left pleural effusion, stable from earlier in the day. Ill-defined airspace opacity right base, likely a focus of pneumonia in this area as well. Stable cardiac silhouette. Electronically Signed   By: Lowella Grip III M.D.   On: 12/16/2019 12:11   DG Chest Port 1 View  Result Date: 12/16/2019 CLINICAL DATA:  COVID-19 positive EXAM: PORTABLE CHEST 1 VIEW COMPARISON:  Radiograph 12/15/2019, CT 04/26/2018 FINDINGS: *Endotracheal tube in the mid trachea. *Transesophageal feeding and some tubes terminate below the level of imaging, terminating beyond the GE junction. *Left IJ approach insert venous catheter tip terminates in the brachiocephalic vein just proximal to the brachiocephalic-caval confluence. There is markedly increased opacification of the left hemithorax with now complete opacification of the left lung with some associated air bronchograms. There is partial atelectatic collapse of the right upper lobe with some additional patchy airspace disease in the right upper lung as well. Difficult to exclude a left effusion. No visible right effusion the portion of the costophrenic sulcus is  collimated. No  pneumothorax is evident. Cardiomediastinal contours are largely obscured. No acute osseous or soft tissue abnormality. IMPRESSION: 1. Markedly increased opacification of the left hemithorax with now complete opacification of the left hemithorax with some associated air bronchograms. Findings are compatible with worsening consolidation and/or collapse. 2. Partial atelectatic collapse of the right upper lobe with some additional patchy airspace disease in the right upper lung as well. 3. Lines and tubes as above. Electronically Signed   By: Lovena Le M.D.   On: 12/16/2019 06:21   DG Chest Port 1 View  Result Date: 12/15/2019 CLINICAL DATA:  Supine positioning in COVID-19 patient EXAM: PORTABLE CHEST 1 VIEW COMPARISON:  Film from earlier in the same day. FINDINGS: Endotracheal tube is seen in satisfactory position. Gastric catheter and left jugular central line are noted. There is improved aeration within the lungs bilaterally although persistent opacity is noted primarily within the right upper lobe and throughout the left lung. No sizable effusion is seen. No bony abnormality is noted. IMPRESSION: Improved aeration in supine positioning. Persistent airspace opacities are again noted bilaterally however. Tubes and lines as described. Electronically Signed   By: Inez Catalina M.D.   On: 12/15/2019 10:38   DG CHEST PORT 1 VIEW  Result Date: 12/15/2019 CLINICAL DATA:  Endotracheal tube placement. EXAM: PORTABLE CHEST 1 VIEW COMPARISON:  Dec 14, 2019 FINDINGS: The study is nearly nondiagnostic. The endotracheal tube may terminate at the level of the thoracic inlet, however adequate detection is severely limited on this study. The enteric tube appears to extend below the left hemidiaphragm. There is a left-sided central venous catheter. There is diffuse opacification of both lung fields which appears to progressed since the prior study. IMPRESSION: 1. Very limited study. 2. It appears that the  endotracheal tube terminates at the thoracic inlet, however exact positioning is difficult to determine on this study. Consider short repeat interval exam for further evaluation. 3. Worsening interval airspace opacities bilaterally. Electronically Signed   By: Constance Holster M.D.   On: 12/15/2019 02:39    Micro Results     No results found for this or any previous visit (from the past 240 hour(s)).  Today   Subjective    Clifford Nichols today has no headache,no chest abdominal pain,no new weakness tingling or numbness, feels much better     Objective   Blood pressure 125/75, pulse 99, temperature 98.5 F (36.9 C), temperature source Oral, resp. rate 20, height 5' 7"  (1.702 m), weight (!) 157.8 kg, SpO2 99 %.   Intake/Output Summary (Last 24 hours) at 01/13/2020 1147 Last data filed at 01/13/2020 1028 Gross per 24 hour  Intake 3005.02 ml  Output 950 ml  Net 2055.02 ml    Exam  Awake Alert, No new F.N deficits, Normal affect [place].AT,PERRAL Supple Neck,No JVD, No cervical lymphadenopathy appriciated.  Symmetrical Chest wall movement, Good air movement bilaterally, CTAB RRR,No Gallops,Rubs or new Murmurs, No Parasternal Heave +ve B.Sounds, Abd Soft, Non tender, No organomegaly appriciated, No rebound -guarding or rigidity. No Cyanosis, Clubbing or edema, No new Rash or bruise   Data Review   CBC w Diff:  Lab Results  Component Value Date   WBC 7.8 01/13/2020   HGB 9.6 (L) 01/13/2020   HCT 32.0 (L) 01/13/2020   PLT 197 01/13/2020   LYMPHOPCT 28 01/13/2020   MONOPCT 7 01/13/2020   EOSPCT 1 01/13/2020   BASOPCT 0 01/13/2020    CMP:  Lab Results  Component Value Date   NA 139 01/13/2020  K 3.5 01/13/2020   CL 103 01/13/2020   CO2 25 01/13/2020   BUN 17 01/13/2020   CREATININE 1.09 01/13/2020   PROT 6.8 01/13/2020   ALBUMIN 2.8 (L) 01/13/2020   BILITOT 0.4 01/13/2020   ALKPHOS 57 01/13/2020   AST 25 01/13/2020   ALT 30 01/13/2020  .   Total Time in  preparing paper work, data evaluation and todays exam - 67 minutes  Lala Lund M.D on 01/13/2020 at 11:47 AM  [facility]   Office  [phone]

## 2020-01-13 NOTE — H&P (Signed)
Physical Medicine and Rehabilitation Admission H&P     CC: Debility.      HPI: Clifford Nichols is a 39 year old male with history of HTN--no meds X couple of months, untreated OSA, morbid obesity who was admitted on 12/02/19 with acute respiratory failure and ARDS from Covid 19 PNA. He required intubation and was treated with Tocilizumab, Remdesivir, Decadron as well as broad spectrum antibiotics. He was not felt to be a candidate for ECMO due to markedly elevated BMI and treated with proning as well as Ketamine. Septic shock with BC positive for strep hominis and staph epi as well as HCAP--BAL 5/28 positive for proteus, pseudomonas, acinetobacter and enterobacter . He was treated with multiple antibiotics since admission ---last Zosyn/Vanc. ID consulted for input on ongoing fevers and felt that +BC likely due to contaminant and Vancomycin d/c by 6/01. He has had intermittent left proptosis noted and CT head negative-->has been a chronic problem per wife. To follow up with opthalmology post discharge.  He developed anemia with thrombocytopenia and was transfused with 2 unita PRBC for drop in H/H to 6.4/24.5   He required tracheostomy 05/28 and extubated to ATC. He was downsized to cuffless but changed back to cuffed #6 XLT on 06/11 due need for PPS vent at nights. Respiratory status stable and he has been weaned off BIPAP--to sleep with HOB elevated per Pulmonary. He is tolerating PMSV during the day with 5L oxygen per ATC and has been refusing CPAP.  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 Pain bilateral feet felt to be due to plantar fascitis and on oxycodone every 4 hours for pain control? Has been advanced to regular with thin liquids and tolerating this without difficulty. Therapy ongoing and CIR recommended due to functional deficits.       Review of Systems  Constitutional: Negative for chills and fever.  HENT: Negative for hearing loss and tinnitus.   Eyes: Negative for  blurred vision and double vision.  Respiratory: Positive for cough. Negative for shortness of breath.   Cardiovascular: Negative for chest pain and palpitations.  Gastrointestinal: Negative for constipation, heartburn and nausea.  Genitourinary: Negative for dysuria and urgency.  Musculoskeletal: Negative for back pain, joint pain and myalgias.  Skin: Negative for itching and rash.  Neurological: Positive for focal weakness (bilateral foot drop). Negative for dizziness and headaches.  Psychiatric/Behavioral: The patient is not nervous/anxious and does not have insomnia.           Past Medical History:  Diagnosis Date  . Abscess of upper back excluding scapular region    . Diabetes mellitus without complication ([place])    . GERD (gastroesophageal reflux disease)      OCC-NO MEDS  . Hypertension    . Sebaceous cyst             Past Surgical History:  Procedure Laterality Date  . IRRIGATION AND DEBRIDEMENT SEBACEOUS CYST Right 07/11/2018    Procedure: EXCISION  SEBACEOUS CYST POSTERIOR SHOULDER AND MID-BACK;  Surgeon: [name], MD;  Location: [facility] ORS;  Service: General;  Laterality: Right;  . NO PAST SURGERIES               Family History  Problem Relation Age of Onset  . Hypertension Father        Social History:  Married. Works in Land at [employer]. He was smoking about 5 cigarettes daily. He has never used smokeless tobacco. He reports uses alcohol---beer "here and there".  He  reports that he does not use drugs.      Allergies: No Known Allergies            Medications Prior to Admission  Medication Sig Dispense Refill  . ascorbic acid (VITAMIN C) 500 MG tablet Take 1 tablet (500 mg total) by mouth daily.      . Chlorhexidine Gluconate Cloth 2 % PADS Apply 6 each topically daily.      . chlorhexidine gluconate, MEDLINE KIT, (PERIDEX) 0.12 % solution 15 mLs by Mouth Rinse route 2 (two) times daily. 120 mL 0  . chlorpheniramine-HYDROcodone (TUSSIONEX) 10-8 MG/5ML  SUER Take 5 mLs by mouth every 12 (twelve) hours as needed for cough. 140 mL    . dexamethasone (DECADRON) 10 MG/ML injection Inject 0.6 mLs (6 mg total) into the vein daily. 1 mL 0  . dexmedetomidine (PRECEDEX) 400 MCG/100ML SOLN Inject 90.72-272.16 mcg/hr into the vein continuous. 1000 mL 0  . docusate (COLACE) 50 MG/5ML liquid Take 10 mLs (100 mg total) by mouth 2 (two) times daily. 100 mL 0  . enoxaparin (LOVENOX) 120 MG/0.8ML injection Inject 0.76 mLs (115 mg total) into the skin daily. 0 mL    . famotidine (PEPCID) 20-0.9 MG/50ML-% Inject 50 mLs (20 mg total) into the vein daily. 50 mL    . fentaNYL (SUBLIMAZE) SOLN Inject 50 mcg into the vein every 15 (fifteen) minutes as needed (to maintain RASS & CPOT goal.).   0  . fentaNYL 10 mcg/ml SOLN infusion Inject 0-400 mcg/hr into the vein continuous.      Marland Kitchen guaiFENesin-dextromethorphan (ROBITUSSIN DM) 100-10 MG/5ML syrup Take 10 mLs by mouth every 4 (four) hours as needed for cough. 118 mL 0  . insulin aspart (NOVOLOG) 100 UNIT/ML injection Inject 0-20 Units into the skin every 4 (four) hours. 10 mL 11  . Ipratropium-Albuterol (COMBIVENT) 20-100 MCG/ACT AERS respimat Inhale 1 puff into the lungs every 6 (six) hours.      Marland Kitchen ivermectin (STROMECTOL) 3 MG TABS tablet Place 6.5 tablets (19,500 mcg total) into feeding tube every evening. 4 tablet    . midazolam (VERSED) 2 MG/2ML SOLN injection Inject 2 mLs (2 mg total) into the vein every 15 (fifteen) minutes as needed (to acheive RASS goal.). 2.5 mL 0  . midazolam (VERSED) 2 MG/2ML SOLN injection Inject 2 mLs (2 mg total) into the vein every 2 (two) hours as needed (to maintain RASS goal.). 2.5 mL 0  . mouth rinse LIQD solution 15 mLs by Mouth Rinse route every morning.   0  . norepinephrine (LEVOPHED) 16-5 MG/250ML-% SOLN Inject 0-40 mcg/min into the vein continuous.      . norepinephrine (LEVOPHED) 4-5 MG/250ML-% SOLN Inject 0-40 mcg/min into the vein continuous.      . ondansetron (ZOFRAN) 4 MG  tablet Take 1 tablet (4 mg total) by mouth every 6 (six) hours as needed for nausea. 20 tablet 0  . polyethylene glycol (MIRALAX / GLYCOLAX) 17 g packet Take 17 g by mouth daily. 14 each 0  . propofol (DIPRIVAN) 1000 MG/100ML EMUL injection Inject 0-11,340 mcg/min into the vein continuous.      . [EXPIRED] sodium chloride flush (NS) 0.9 % SOLN Inject 3 mLs into the vein once for 1 dose. 3 mL 0  . vecuronium (NORCURON) 10 MG injection Inject 10 mLs (10 mg total) into the vein every hour as needed (vent asynchrony). 1 each    . zinc sulfate 220 (50 Zn) MG capsule Take 1 capsule (220 mg total) by  mouth daily.          Drug Regimen Review  Drug regimen was reviewed and remains appropriate with no significant issues identified   Home: Home Living Family/patient expects to be discharged to:: Inpatient rehab Additional Comments: no family present   Functional History: Prior Function Level of Independence: Independent 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; per chart working in security   Functional Status:  Mobility: Bed Mobility Overal bed mobility: Needs Assistance Bed Mobility: Sit to Sidelying Rolling: Min guard Sidelying to sit: Min guard Supine to sit: Min guard Sit to supine: Mod assist Sit to sidelying: Mod assist General bed mobility comments: sitting EOB on arrival Transfers Overall transfer level: Needs assistance Equipment used: Rolling walker (2 wheeled) Transfer via Lift Equipment: [place] Transfers: Sit to/from Stand Sit to Stand: [place] assist General transfer comment: needs stability assist due to his limited ankle/foot function. Ambulation/Gait Ambulation/Gait assistance: Min assist, Mod assist, +2 safety/equipment Gait Distance (Feet): 40 Feet (70 feet and 50 feet) Assistive device: Rolling walker (2 wheeled) Gait Pattern/deviations: Step-through pattern, Decreased stride length, Decreased dorsiflexion - right, Decreased dorsiflexion - left General Gait Details: more stability assist  needed due to pt with shoes on today making his feet heavier, use of everters to df are weak and drag with swing through and then mildly more unsteady contact. Gait velocity: slower Gait velocity interpretation: <1.8 ft/sec, indicate of risk for recurrent falls   ADL: ADL Overall ADL's : Needs assistance/impaired Eating/Feeding: NPO Grooming: Wash/dry face, Brushing hair, Minimal assistance, Moderate assistance, Standing Grooming Details (indicate cue type and reason): +2 for safety with standing; min-modA for balance without UE support  Lower Body Dressing: Maximal assistance Lower Body Dressing Details (indicate cue type and reason): pt pushing down on command to help don new tennis shoes Toilet Transfer: +2 for physical assistance, Minimal assistance, RW Toilet Transfer Details (indicate cue type and reason): simulated from recliner. power up with bil Ue  Functional mobility during ADLs: Minimal assistance, Moderate assistance, +2 for safety/equipment, Rolling walker General ADL Comments: pt with steady progress towards OT goals; practicing functional tasks in standing while challenging balance - including standing grooming ADL, donning/doffing gloves, reaching and placing items on table, reaching and reading aloud card from family   Cognition: Cognition Overall Cognitive Status: Within Functional Limits for tasks assessed Orientation Level: Oriented X4 Cognition Arousal/Alertness: Awake/alert Behavior During Therapy: WFL for tasks assessed/performed Overall Cognitive Status: Within Functional Limits for tasks assessed Area of Impairment: Attention, Following commands, Awareness Current Attention Level: Focused, Sustained Following Commands: Follows one step commands with increased time Awareness: Intellectual General Comments: pt nodding head at times to indicate a yet. pt following 1 step comamnds Difficult to assess due to: Tracheostomy     Blood pressure 125/75, pulse 99,  temperature 98.5 F (36.9 C), temperature source Oral, resp. rate 20, height _0  (1.702 m), weight (!) 157.8 kg, SpO2 99 %. Physical Exam  Nursing note and vitals reviewed. Constitutional: He is oriented to person, place, and time. He does not appear ill.  Lying in bed on left side. Occasional dry cough noted. NAD.   Neck:  Cuffed #6 trach in place with PMSV. On 5 L oxygen per ATC.   Neurological: He is alert and oriented to person, place, and time.    General: No acute distress Mood and affect are appropriate Heart: Regular rate and rhythm no rubs murmurs or extra sounds Lungs: Clear to auscultation, breathing unlabored, no rales or wheezes Abdomen: Positive bowel sounds, soft nontender to  palpation, nondistended Extremities: No clubbing, cyanosis, or edema Skin: No evidence of breakdown, no evidence of rash Neurologic: Cranial nerves II through XII intact, motor strength is 5/5 in bilateral deltoid, bicep, tricep, grip, 4/5 hip flexor, knee extensors,0/5 ankle dorsiflexor and plantar flexor Sensory exam absent light touch and proprioception right foot, absent light touch with intact proprioception left foot cerebellar exam normal finger to nose to finger as well as heel to shin in bilateral upper and lower extremities Musculoskeletal: Full range of motion in all 4 extremities. No joint swelling   Lab Results Last 48 Hours        Results for orders placed or performed during the hospital encounter of 12/04/19 (from the past 48 hour(s))  Glucose, capillary     Status: Abnormal    Collection Time: 01/11/20  3:43 PM  Result Value Ref Range    Glucose-Capillary 107 (H) 70 - 99 mg/dL      Comment: Glucose reference range applies only to samples taken after fasting for at least 8 hours.  Glucose, capillary     Status: Abnormal    Collection Time: 01/11/20  9:47 PM  Result Value Ref Range    Glucose-Capillary 115 (H) 70 - 99 mg/dL      Comment: Glucose reference range applies only to  samples taken after fasting for at least 8 hours.  CBC with Differential/Platelet     Status: Abnormal    Collection Time: 01/12/20  2:44 AM  Result Value Ref Range    WBC 7.7 4.0 - 10.5 K/uL    RBC 3.36 (L) 4.22 - 5.81 MIL/uL    Hemoglobin 9.8 (L) 13.0 - 17.0 g/dL    HCT 32.2 (L) 39 - 52 %    MCV 95.8 80.0 - 100.0 fL    MCH 29.2 26.0 - 34.0 pg    MCHC 30.4 30.0 - 36.0 g/dL    RDW 14.6 11.5 - 15.5 %    Platelets 194 150 - 400 K/uL    nRBC 0.0 0.0 - 0.2 %    Neutrophils Relative % 66 %    Neutro Abs 5.1 1.7 - 7.7 K/uL    Lymphocytes Relative 26 %    Lymphs Abs 2.0 0.7 - 4.0 K/uL    Monocytes Relative 6 %    Monocytes Absolute 0.5 0 - 1 K/uL    Eosinophils Relative 2 %    Eosinophils Absolute 0.1 0 - 0 K/uL    Basophils Relative 0 %    Basophils Absolute 0.0 0 - 0 K/uL    Immature Granulocytes 0 %    Abs Immature Granulocytes 0.02 0.00 - 0.07 K/uL      Comment: Performed at [facility], [address], [place], [place] [address]  Brain natriuretic peptide     Status: None    Collection Time: 01/12/20  2:44 AM  Result Value Ref Range    B Natriuretic Peptide 18.9 0.0 - 100.0 pg/mL      Comment: Performed at [facility] [address], [place], [place] [address]  Comprehensive metabolic panel     Status: Abnormal    Collection Time: 01/12/20  2:44 AM  Result Value Ref Range    Sodium 137 135 - 145 mmol/L    Potassium 2.9 (L) 3.5 - 5.1 mmol/L    Chloride 98 98 - 111 mmol/L    CO2 27 22 - 32 mmol/L    Glucose, Bld 130 (H) 70 - 99 mg/dL  Comment: Glucose reference range applies only to samples taken after fasting for at least 8 hours.    BUN 24 (H) 6 - 20 mg/dL    Creatinine, Ser 1.30 (H) 0.61 - 1.24 mg/dL    Calcium 9.3 8.9 - 10.3 mg/dL    Total Protein 7.6 6.5 - 8.1 g/dL    Albumin 3.0 (L) 3.5 - 5.0 g/dL    AST 24 15 - 41 U/L    ALT 30 0 - 44 U/L    Alkaline Phosphatase 58 38 - 126 U/L    Total Bilirubin 0.3 0.3 - 1.2 mg/dL    GFR calc non Af Amer >60  >60 mL/min    GFR calc Af Amer >60 >60 mL/min    Anion gap 12 5 - 15      Comment: Performed at [facility] [address] [place], [place] [address]  Magnesium     Status: None    Collection Time: 01/12/20  2:44 AM  Result Value Ref Range    Magnesium 2.1 1.7 - 2.4 mg/dL      Comment: Performed at [facility] [address] [place], [place] [address]  D-dimer, quantitative (not at [facility])     Status: Abnormal    Collection Time: 01/12/20  2:44 AM  Result Value Ref Range    D-Dimer, Quant 1.49 (H) 0.00 - 0.50 ug/mL-FEU      Comment: (NOTE) At the manufacturer cut-off of 0.50 ug/mL FEU, this assay has been documented to exclude PE with a sensitivity and negative predictive value of 97 to 99%.  At this time, this assay has not been approved by the FDA to exclude DVT/VTE. Results should be correlated with clinical presentation. Performed at [facility], [place] [address], [place], [place] [address]    Glucose, capillary     Status: Abnormal    Collection Time: 01/12/20  8:02 AM  Result Value Ref Range    Glucose-Capillary 136 (H) 70 - 99 mg/dL      Comment: Glucose reference range applies only to samples taken after fasting for at least 8 hours.  Glucose, capillary     Status: Abnormal    Collection Time: 01/12/20 12:20 PM  Result Value Ref Range    Glucose-Capillary 121 (H) 70 - 99 mg/dL      Comment: Glucose reference range applies only to samples taken after fasting for at least 8 hours.  Glucose, capillary     Status: None    Collection Time: 01/12/20  4:35 PM  Result Value Ref Range    Glucose-Capillary 96 70 - 99 mg/dL      Comment: Glucose reference range applies only to samples taken after fasting for at least 8 hours.  Potassium     Status: None    Collection Time: 01/12/20  9:05 PM  Result Value Ref Range    Potassium 3.5 3.5 - 5.1 mmol/L      Comment: Performed at [facility], [place] [address] [place], [place] [address]  Glucose, capillary      Status: Abnormal    Collection Time: 01/12/20  9:19 PM  Result Value Ref Range    Glucose-Capillary 119 (H) 70 - 99 mg/dL      Comment: Glucose reference range applies only to samples taken after fasting for at least 8 hours.  CBC with Differential/Platelet     Status: Abnormal    Collection Time: 01/13/20  2:16 AM  Result Value  Ref Range    WBC 7.8 4.0 - 10.5 K/uL    RBC 3.29 (L) 4.22 - 5.81 MIL/uL    Hemoglobin 9.6 (L) 13.0 - 17.0 g/dL    HCT 32.0 (L) 39 - 52 %    MCV 97.3 80.0 - 100.0 fL    MCH 29.2 26.0 - 34.0 pg    MCHC 30.0 30.0 - 36.0 g/dL    RDW 14.6 11.5 - 15.5 %    Platelets 197 150 - 400 K/uL      Comment: REPEATED TO VERIFY    nRBC 0.0 0.0 - 0.2 %    Neutrophils Relative % 64 %    Neutro Abs 4.9 1.7 - 7.7 K/uL    Lymphocytes Relative 28 %    Lymphs Abs 2.2 0.7 - 4.0 K/uL    Monocytes Relative 7 %    Monocytes Absolute 0.6 0 - 1 K/uL    Eosinophils Relative 1 %    Eosinophils Absolute 0.1 0 - 0 K/uL    Basophils Relative 0 %    Basophils Absolute 0.0 0 - 0 K/uL    Immature Granulocytes 0 %    Abs Immature Granulocytes 0.03 0.00 - 0.07 K/uL      Comment: Performed at [facility], [address], [place], [place] [address]  Brain natriuretic peptide     Status: None    Collection Time: 01/13/20  2:16 AM  Result Value Ref Range    B Natriuretic Peptide 22.9 0.0 - 100.0 pg/mL      Comment: Performed at [facility] [address], [place], [place] [address]  Comprehensive metabolic panel     Status: Abnormal    Collection Time: 01/13/20  2:16 AM  Result Value Ref Range    Sodium 139 135 - 145 mmol/L    Potassium 3.5 3.5 - 5.1 mmol/L    Chloride 103 98 - 111 mmol/L    CO2 25 22 - 32 mmol/L    Glucose, Bld 137 (H) 70 - 99 mg/dL      Comment: Glucose reference range applies only to samples taken after fasting for at least 8 hours.    BUN 17 6 - 20 mg/dL    Creatinine, Ser 1.09 0.61 - 1.24 mg/dL    Calcium 8.7 (L) 8.9 - 10.3 mg/dL    Total Protein  6.8 6.5 - 8.1 g/dL    Albumin 2.8 (L) 3.5 - 5.0 g/dL    AST 25 15 - 41 U/L    ALT 30 0 - 44 U/L    Alkaline Phosphatase 57 38 - 126 U/L    Total Bilirubin 0.4 0.3 - 1.2 mg/dL    GFR calc non Af Amer >60 >60 mL/min    GFR calc Af Amer >60 >60 mL/min    Anion gap 11 5 - 15      Comment: Performed at [facility], [place] [address], [place], [place] [address]  Magnesium     Status: None    Collection Time: 01/13/20  2:16 AM  Result Value Ref Range    Magnesium 2.0 1.7 - 2.4 mg/dL      Comment: Performed at [facility] [address], [place], [place] [address]  D-dimer, quantitative (not at [facility])     Status: Abnormal    Collection Time: 01/13/20  2:16 AM  Result Value Ref Range    D-Dimer, Quant 1.50 (H) 0.00 - 0.50 ug/mL-FEU  Comment: (NOTE) At the manufacturer cut-off of 0.50 ug/mL FEU, this assay has been documented to exclude PE with a sensitivity and negative predictive value of 97 to 99%.  At this time, this assay has not been approved by the FDA to exclude DVT/VTE. Results should be correlated with clinical presentation. Performed at [facility], [place] [address], [place], [place] [address]    Glucose, capillary     Status: Abnormal    Collection Time: 01/13/20  7:48 AM  Result Value Ref Range    Glucose-Capillary 123 (H) 70 - 99 mg/dL      Comment: Glucose reference range applies only to samples taken after fasting for at least 8 hours.  Glucose, capillary     Status: Abnormal    Collection Time: 01/13/20 11:48 AM  Result Value Ref Range    Glucose-Capillary 115 (H) 70 - 99 mg/dL      Comment: Glucose reference range applies only to samples taken after fasting for at least 8 hours.      Imaging Results (Last 48 hours)  No results found.           Medical Problem List and Plan: 1.    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   Antithrombotics: -DVT/anticoagulation:  Pharmaceutical: Lovenox bid             -antiplatelet therapy: N/A 3. Pain Management: ON oxycodone 10 mg every 4 hours--denies any issues with pain. Attempt to start wean over next few days. Continue gabapentin bid for neuropathy.  4. Mood: LCSW to follow for evaluation and support.              -antipsychotic agents: N/A 5. Neuropsych: This patient is capable of making decisions on his own behalf. 6. Sacral decub/Skin/Wound Care: Hydrotherapy to sacral wound 5 X week with santyl wet to dry dressing changes. Continue Zinc and vitamin C. Will add protein supplement to promote healing.  7. Fluids/Electrolytes/Nutrition: Monitor I/O. Check lytes in am.  8. HTN: Monitor BP tid--controlled without meds.    9. Covid PNA with hypoxemic respiratory failure: Keep HOB >35 degrees at nights. On 5 L oxygen per ATC.  10. New diagnosis T2DM: Hgb A1C-6.6. Will transition from levemir to oral hypoglycemic regimen. Change diet to CM. Continue to monitor BS ac/hs and titrate medication as indicated.  11. Anemia of critical illness: Is improving from drop to  7.7-->9.6 12.  Morbid obesity:  Educated on weight loss and healthy diet. 13. OSA: Has had study years ago but could not afford CPAP per mother. Will need sleep study after discharge. 14. Resting Tachycardia: Likely due to debility--HR in 110-120. If BP is up can add BB       Bary Leriche, PA-C 01/13/2020        "I have personally performed a face to face diagnostic evaluation of this patient.  Additionally, I have reviewed and concur with the physician assistant's documentation above." [name] M.D. [facility] FAAPM&R (Neuromuscular Med) Diplomate Am Board of Electrodiagnostic Med Fellow Am Board of Interventional Pain

## 2020-01-14 ENCOUNTER — Inpatient Hospital Stay (HOSPITAL_COMMUNITY): Payer: BLUE CROSS/BLUE SHIELD | Admitting: Occupational Therapy

## 2020-01-14 ENCOUNTER — Inpatient Hospital Stay (HOSPITAL_COMMUNITY): Payer: BLUE CROSS/BLUE SHIELD | Admitting: Speech Pathology

## 2020-01-14 ENCOUNTER — Inpatient Hospital Stay (HOSPITAL_COMMUNITY): Payer: BLUE CROSS/BLUE SHIELD | Admitting: Physical Therapy

## 2020-01-14 LAB — COMPREHENSIVE METABOLIC PANEL
ALT: 30 U/L (ref 0–44)
AST: 24 U/L (ref 15–41)
Albumin: 2.8 g/dL — ABNORMAL LOW (ref 3.5–5.0)
Alkaline Phosphatase: 54 U/L (ref 38–126)
Anion gap: 9 (ref 5–15)
BUN: 10 mg/dL (ref 6–20)
CO2: 26 mmol/L (ref 22–32)
Calcium: 9.1 mg/dL (ref 8.9–10.3)
Chloride: 106 mmol/L (ref 98–111)
Creatinine, Ser: 1.03 mg/dL (ref 0.61–1.24)
GFR calc Af Amer: >60 mL/min (ref >60)
GFR calc non Af Amer: >60 mL/min (ref >60)
Glucose, Bld: 127 mg/dL — ABNORMAL HIGH (ref 70–99)
Potassium: 3.9 mmol/L (ref 3.5–5.1)
Sodium: 141 mmol/L (ref 135–145)
Total Bilirubin: 0.4 mg/dL (ref 0.3–1.2)
Total Protein: 6.8 g/dL (ref 6.5–8.1)

## 2020-01-14 LAB — CBC WITH DIFFERENTIAL/PLATELET
Abs Immature Granulocytes: 0.04 10*3/uL (ref 0.00–0.07)
Basophils Absolute: 0 10*3/uL (ref 0.0–0.1)
Basophils Relative: 0 %
Eosinophils Absolute: 0.1 10*3/uL (ref 0.0–0.5)
Eosinophils Relative: 1 %
HCT: 34.2 % — ABNORMAL LOW (ref 39.0–52.0)
Hemoglobin: 10.1 g/dL — ABNORMAL LOW (ref 13.0–17.0)
Immature Granulocytes: 1 %
Lymphocytes Relative: 23 %
Lymphs Abs: 1.6 10*3/uL (ref 0.7–4.0)
MCH: 29.4 pg (ref 26.0–34.0)
MCHC: 29.5 g/dL — ABNORMAL LOW (ref 30.0–36.0)
MCV: 99.7 fL (ref 80.0–100.0)
Monocytes Absolute: 0.6 10*3/uL (ref 0.1–1.0)
Monocytes Relative: 8 %
Neutro Abs: 4.6 10*3/uL (ref 1.7–7.7)
Neutrophils Relative %: 67 %
Platelets: 202 10*3/uL (ref 150–400)
RBC: 3.43 MIL/uL — ABNORMAL LOW (ref 4.22–5.81)
RDW: 14.8 % (ref 11.5–15.5)
WBC: 6.9 10*3/uL (ref 4.0–10.5)
nRBC: 0 % (ref 0.0–0.2)

## 2020-01-14 LAB — GLUCOSE, CAPILLARY
Glucose-Capillary: 114 mg/dL — ABNORMAL HIGH (ref 70–99)
Glucose-Capillary: 121 mg/dL — ABNORMAL HIGH (ref 70–99)
Glucose-Capillary: 121 mg/dL — ABNORMAL HIGH (ref 70–99)
Glucose-Capillary: 96 mg/dL (ref 70–99)
Glucose-Capillary: 100 mg/dL — ABNORMAL HIGH (ref 70–99)

## 2020-01-14 LAB — FUNGUS CULTURE WITH STAIN

## 2020-01-14 LAB — MAGNESIUM: Magnesium: 1.9 mg/dL (ref 1.7–2.4)

## 2020-01-14 LAB — FUNGAL ORGANISM REFLEX

## 2020-01-14 LAB — FUNGUS CULTURE RESULT

## 2020-01-14 LAB — BRAIN NATRIURETIC PEPTIDE: B Natriuretic Peptide: 54.9 pg/mL (ref 0.0–100.0)

## 2020-01-14 MED ORDER — ADULT MULTIVITAMIN W/MINERALS CH
1.0000 | ORAL_TABLET | Freq: Every day | ORAL | Status: DC
  Administered 2020-01-15 – 2020-01-24 (×10): 1 via ORAL
  Filled 2020-01-14 (×10): qty 1

## 2020-01-14 MED ORDER — ENSURE MAX PROTEIN PO LIQD
11.0000 [oz_av] | Freq: Two times a day (BID) | ORAL | Status: DC
  Administered 2020-01-14 – 2020-01-19 (×8): 11 [oz_av] via ORAL
  Filled 2020-01-14 (×15): qty 330

## 2020-01-14 MED ORDER — LIVING WELL WITH DIABETES BOOK
Freq: Once | Status: AC
  Filled 2020-01-14: qty 1

## 2020-01-14 MED ORDER — JUVEN PO PACK
1.0000 | PACK | Freq: Two times a day (BID) | ORAL | Status: DC
  Administered 2020-01-15 – 2020-01-24 (×15): 1 via ORAL
  Filled 2020-01-14 (×15): qty 1

## 2020-01-14 MED ORDER — GABAPENTIN 300 MG PO CAPS
300.0000 mg | ORAL_CAPSULE | Freq: Three times a day (TID) | ORAL | Status: DC
  Administered 2020-01-14 – 2020-01-15 (×3): 300 mg via ORAL
  Filled 2020-01-14 (×3): qty 1

## 2020-01-14 NOTE — Progress Notes (Signed)
[facility] PHYSICAL MEDICINE & REHABILITATION PROGRESS NOTE   Subjective/Complaints:  Labs reviewed stable/improving   ROS - CP, SOB, N/V/D  Objective:   No results found. Recent Labs    01/13/20 0216 01/14/20 0659  WBC 7.8 6.9  HGB 9.6* 10.1*  HCT 32.0* 34.2*  PLT 197 202   Recent Labs    01/12/20 0244 01/12/20 0244 01/12/20 2105 01/13/20 0216  NA 137  --   --  139  K 2.9*   < > 3.5 3.5  CL 98  --   --  103  CO2 27  --   --  25  GLUCOSE 130*  --   --  137*  BUN 24*  --   --  17  CREATININE 1.30*  --   --  1.09  CALCIUM 9.3  --   --  8.7*   < > = values in this interval not displayed.    Intake/Output Summary (Last 24 hours) at 01/14/2020 0756 Last data filed at 01/14/2020 0651 Gross per 24 hour  Intake 480 ml  Output 1200 ml  Net -720 ml     Physical Exam: Vital Signs Blood pressure 124/74, pulse (!) 103, temperature 98.8 F (37.1 C), temperature source Oral, resp. rate 18, weight (!) 160.1 kg, SpO2 100 %.  General: No acute distress Mood and affect are appropriate Heart: Regular rate and rhythm no rubs murmurs or extra sounds Lungs: Clear to auscultation, breathing unlabored, no rales or wheezes Abdomen: Positive bowel sounds, soft nontender to palpation, nondistended Extremities: No clubbing, cyanosis, or edema Skin: No evidence of breakdown, no evidence of rash Neurologic: Cranial nerves II through XII intact, motor strength is 5/5 in bilateral deltoid, bicep, tricep, grip, hip flexor, knee extensors, ankle dorsiflexor and plantar flexor Sensory examabsent sensation to light touch and proprioception right foot , absent LT left foot but intact proprio Musculoskeletal: Full range of motion in all 4 extremities. No joint swelling     Assessment/Plan: 1. Functional deficits secondary to Critical illness polyneuropathy  which require 3+ hours per day of interdisciplinary therapy in a comprehensive inpatient rehab setting.  Physiatrist is providing  close team supervision and 24 hour management of active medical problems listed below.  Physiatrist and rehab team continue to assess barriers to discharge/monitor patient progress toward functional and medical goals  Care Tool:  Bathing              Bathing assist       Upper Body Dressing/Undressing Upper body dressing        Upper body assist      Lower Body Dressing/Undressing Lower body dressing            Lower body assist       Toileting Toileting    Toileting assist Assist for toileting: Independent with assistive device Assistive Device Comment: urinal   Transfers Chair/bed transfer  Transfers assist           Locomotion Ambulation   Ambulation assist      Assist level: Minimal Assistance - Patient > 75%       Walk 10 feet activity   Assist           Walk 50 feet activity   Assist           Walk 150 feet activity   Assist           Walk 10 feet on uneven surface  activity   Assist  Wheelchair     Assist               Wheelchair 50 feet with 2 turns activity    Assist            Wheelchair 150 feet activity     Assist          Blood pressure 124/74, pulse (!) 103, temperature 98.8 F (37.1 C), temperature source Oral, resp. rate 18, weight (!) 160.1 kg, SpO2 100 %.  Medical Problem List and Plan: 1.   Reduced mobility and self-care skills secondary to critical illness polyneuropathy -patient may not shower -ELOS/Goals: 7 to 10 days 2. Antithrombotics: -DVT/anticoagulation:Pharmaceutical:Lovenoxbid -antiplatelet therapy: N/A 3. Pain Management:ON oxycodone 10 mg every 4 hours--denies any issues with pain. Attempt to start wean over next few days. Continue gabapentin bid for neuropathy. 4. Mood:LCSW to follow for evaluation and support. -antipsychotic agents: N/A 5. Neuropsych: This patientiscapable  of making decisions onhisown behalf. 6.Sacral decub/Skin/Wound Care:Hydrotherapy to sacral wound 5 X week with santyl wet to dry dressing changes. Continue Zinc and vitamin C. Will add protein supplement to promote healing. 7. Fluids/Electrolytes/Nutrition:Monitor I/O. Check lytes in am.  8. HTN: Monitor BP tid--controlled without meds. Vitals:   01/14/20 0538 01/14/20 0734  BP: 124/74   Pulse: (!) 105 (!) 103  Resp:  18  Temp:    SpO2:  100%   9. Covid PNA with hypoxemic respiratory failure:Keep HOB >35 degrees at nights.On 5 L oxygen per ATC. 10. New diagnosis T2DM: Hgb A1C-6.6. Will transition from levemir to oral hypoglycemic regimen. Change diet to CM. Continue to monitor BS ac/hs and titrate medication as indicated.  CBG (last 3)  Recent Labs    01/13/20 1713 01/13/20 2035 01/14/20 0635  GLUCAP 99 124* 114*    11. Anemia of critical illness: Is improving from drop to 7.7-->9.6 12. Morbid obesity: Educated on weight loss and healthy diet. 13. OSA: Has had study years ago but could not afford CPAP per mother. Will need sleep study after discharge. 14. Resting Tachycardia: Likely due to debility--HR in 110-120.If BP is up can add BB    LOS: 1 days A FACE TO FACE EVALUATION WAS PERFORMED  [name] 01/14/2020, 7:56 AM

## 2020-01-14 NOTE — Progress Notes (Signed)
Inpatient Rehabilitation  Patient information reviewed and entered into eRehab system by Ziaire Bieser M. Jisele Price, M.A., CCC/SLP, PPS Coordinator.  Information including medical coding, functional ability and quality indicators will be reviewed and updated through discharge.    

## 2020-01-14 NOTE — Care Management (Signed)
Patient ID: Clifford Nichols, male   DOB: 1980/11/17, 39 y.o.   MRN: [id number] Met with the patient to review role of CM and collaboration with SW Margreta Journey) to facilitate prep for discharge. Avatar updated to reflect sacral wound and wound to tongue and trach-PMSV during the day; trach collar. Patient is a new diagnosed diabetic on Levamir. Patient was unaware of diabetes diagnosis and does not have a PCP. Patient given information regarding living well with diabetes, carb counting and deconditioning. Patient will need additional instruction on insulin administration and family education on wound care for discharge. Continue to follow along to address nursing issues.

## 2020-01-14 NOTE — Evaluation (Signed)
Speech Language Pathology Assessment and Plan  Patient Details  Name: HELMUTH RECUPERO MRN: [id number] Date of Birth: [birth year]  SLP Diagnosis: Voice disorder  Rehab Potential: Excellent ELOS: 3-5 days for ST only - longer for OT/PT LOS    Today's Date: 01/14/2020 SLP Individual Time: 0730-0825 SLP Individual Time Calculation (min): 55 min   Problem List:  Patient Active Problem List   Diagnosis Date Noted  . Critical illness polyneuropathy ([place]) 01/13/2020  . Debility 01/13/2020  . Status post tracheostomy ([place])   . Pressure injury of skin 12/27/2019  . Acute on chronic respiratory failure with hypoxia and hypercapnia ([facility])   . Acute respiratory failure ([place])   . ARDS (adult respiratory distress syndrome) ([place]) 12/04/2019  . COVID-19 12/03/2019  . Acute respiratory failure due to COVID-19 ([place]) 12/02/2019  . Hyperglycemia 12/02/2019  . Diabetes ([place]) 12/02/2019  . HTN (hypertension) 04/27/2018   Past Medical History:  Past Medical History:  Diagnosis Date  . Abscess of upper back excluding scapular region   . Diabetes mellitus without complication ([place])   . GERD (gastroesophageal reflux disease)    OCC-NO MEDS  . Hypertension   . Sebaceous cyst    Past Surgical History:  Past Surgical History:  Procedure Laterality Date  . IRRIGATION AND DEBRIDEMENT SEBACEOUS CYST Right 07/11/2018   Procedure: EXCISION  SEBACEOUS CYST POSTERIOR SHOULDER AND MID-BACK;  Surgeon: [name], MD;  Location: [facility] ORS;  Service: General;  Laterality: Right;  . NO PAST SURGERIES      Assessment / Plan / Recommendation Clinical Impression   MLJ:QGBEEFEO L. Drew is a 39 year old male with history of HTN--no meds X couple of months, untreated OSA, morbid obesity who was admitted on05/10/21 with acute respiratory failure and ARDS from Covid 19 PNA. He required intubation and was treated with Tocilizumab, Remdesivir, Decadron as well as broad spectrum antibiotics. He was not felt to be  a candidate for ECMO due to markedly elevated BMI and treated with proning as well as Ketamine. Septic shock with BC positive for strep hominis and staph epi as well as HCAP--BAL 5/28 positive for proteus, pseudomonas, acinetobacter and enterobacter . He was treated with multiple antibiotics since admission ---last Zosyn/Vanc. ID consulted for input on ongoing fevers and felt that +BC likely due to contaminant and Vancomycin d/c by 6/01. He has had intermittent left proptosis noted and CT head negative-->has been a chronic problem per wife. To follow up with opthalmology post discharge. He developed anemia with thrombocytopenia and was transfused with 2 unita PRBC for drop in H/H to 6.4/24.5  He required tracheostomy 05/28 and extubated to ATC. He was downsized to cuffless but changed back to cuffed #6 XLT on 06/11 due need for PPS vent at nights. Respiratory status stable and he has been weaned off BIPAP--to sleep with HOB elevated per Pulmonary. He is tolerating PMSV during the day with 5L oxygen per ATC and has been refusing CPAP. 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 Pain bilateral feet felt to be due to plantar fascitis and on oxycodone every 4 hours for pain control? Has been advanced to regular with thin liquids and tolerating this without difficulty. Pt admitted to [facility] CIR 01/13/20 and SLP evaluations were completed 01/14/20 with results as follows:  Pt's cognition was largely [facility] and determined to be at baseline level of functioning. Mild deficits in basic calculations noted, however this is baseline for pt and he identified and demonstrated strategies to compensate Mod I. Pt  WFL for delayed recall task and Mod I for recall of CIR safety precautions and other new information since admission. Pt demonstrated appropriate selective attention and awareness of current deficits, as well as verbal safety awareness. Pt also exhibited in tact functional problem solving, planning, and  organization. Expressive and receptive language skills were [facility] throughout.   Brief screen of pt's swallowing also conducted, given recent upgrade to regular/thin textures with no follow up for tolerance on acute. Pt with efficient masticatio and oral clearance, independent with safe swallow precautions nad no overt s/sx noted across any textures. Continue regular diet - no skilled ST follow up indicated for swallowing.   Pt with Shiley 7m cuffed trach; cuff was deflated at baseline. PMSV was placed at beginning of session; pt able to redirect air through upper airway to achieve phonation and was 100% intelligible in conversation. No evidence of CO2 retention/air trapping noted after 1, 3, 10, or 15 minute intervals with PMSV donned. After extended wear (~20-25 minutes), audible rush of air indicative of mild CO2 retention noted when PMSV doffed at end of session, however pt did not demonstrate or report any SOB. Would recommend pt continue to wear PMSV during all waking hours and PO intake, but with staff supervision.   Given that MD indicated pt is likely to d/c home with trach, would recommend consideration of cuffless and/or smaller trach if able to tolerate from pulmonary standpoint, as CO2 retention may be eliminated with PMSV use with smaller/cuffless options. Pt also expressed interest in learning more about PMSV care, therefore, would recommend brief follow up in ST to complete PMSV education and ensure continued tolerance of PMSV for verbal communication.    Skilled Therapeutic Interventions          Cognitive-linguistic and Passy Muir Speaking Valve (PMSV) evaluations, as well as a screen for swallowing were completed and results were reviewed with pt (please see above for details regarding results).    SLP Assessment  Patient will need skilled Speech Lanaguage Pathology Services during CIR admission    Recommendations  Patient may use Passy-Muir Speech Valve: During PO intake/meals;During  all waking hours (remove during sleep) PMSV Supervision: Intermittent MD: Please consider changing trach tube to : Smaller size;Cuffless SLP Diet Recommendations: Age appropriate regular solids;Thin Liquid Administration via: Cup;Straw Medication Administration: Whole meds with liquid Supervision: Patient able to self feed Postural Changes and/or Swallow Maneuvers: Seated upright 90 degrees Oral Care Recommendations: Oral care QID Patient destination: Home Follow up Recommendations: None Equipment Recommended: Other (comment) (PMSV (pt already has one at bedside))    SLP Frequency 3 to 5 out of 7 days   SLP Duration  SLP Intensity  SLP Treatment/Interventions 3-5 days for ST only - longer for OT/PT LOS  Minumum of 1-2 x/day, 30 to 90 minutes  Patient/family education    Pain Pain Assessment Pain Scale: Faces Faces Pain Scale: Hurts little more Pain Type: Acute pain Pain Location: Foot Pain Orientation: Right;Left Pain Descriptors / Indicators: Constant;Shooting Pain Onset: On-going Pain Intervention(s): MD notified (Comment)     SLP Evaluation Cognition Overall Cognitive Status: Within Functional Limits for tasks assessed Arousal/Alertness: Awake/alert Orientation Level: Oriented X4 Attention: Selective Safety/Judgment: Appears intact  Comprehension Auditory Comprehension Overall Auditory Comprehension: Appears within functional limits for tasks assessed Yes/No Questions: Within Functional Limits Commands: Within Functional Limits Conversation: Complex Visual Recognition/Discrimination Discrimination: Within Function Limits Reading Comprehension Reading Status: Within funtional limits Expression Expression Primary Mode of Expression: Verbal Verbal Expression Overall Verbal Expression: Appears within  functional limits for tasks assessed Written Expression Written Expression: Not tested Oral Motor Oral Motor/Sensory Function Overall Oral Motor/Sensory  Function: Within functional limits Motor Speech Overall Motor Speech: Appears within functional limits for tasks assessed Phonation: Normal Intelligibility: Intelligible Word: 75-100% accurate Phrase: 75-100% accurate Sentence: 75-100% accurate Conversation: 75-100% accurate Motor Planning: Witnin functional limits Motor Speech Errors: Not applicable   PMSV Assessment  PMSV Trial PMSV was placed for: 1 hr (intermittent removal to check for air trapping) Able to redirect subglottic air through upper airway: Yes Able to Attain Phonation: Yes Voice Quality: Normal Able to Expectorate Secretions: No attempts Level of Secretion Expectoration with PMSV: Not observed Breath Support for Phonation: Adequate Intelligibility: Intelligible Word: 75-100% accurate Phrase: 75-100% accurate Sentence: 75-100% accurate Conversation: 75-100% accurate Behavior: Alert;Controlled;Cooperative;Expresses self well;Responsive to questions;Good eye contact  Bedside Swallowing Assessment General Previous Swallow Assessment: FEES 01/03/20 Diet Prior to this Study: Regular;Thin liquids Temperature Spikes Noted: No Respiratory Status: Trach Trach Size and Type: Cuff;#6;Deflated;With PMSV in place History of Recent Intubation: Yes Length of Intubations (days): 17 days Date extubated:  (intubated 12/03/19 - trach placed 12/20/19) Behavior/Cognition: Alert;Cooperative;Pleasant mood Oral Cavity - Dentition: Adequate natural dentition Self-Feeding Abilities: Able to feed self Patient Positioning: Upright in bed Baseline Vocal Quality: Normal Volitional Cough: Strong Volitional Swallow: Able to elicit  Oral Care Assessment Does patient have any of the following "high(er) risk" factors?: Tracheostomy with trach collar 24 hrs./day Patient is HIGH RISK: Non-ventilated: Order set for Adult Oral Care Protocol initiated - "High Risk Patients - Non-Ventilated" option selected  (see row information) Ice Chips Ice  chips: Not tested Thin Liquid Thin Liquid: Within functional limits Presentation: Cup;Self Fed Nectar Thick Nectar Thick Liquid: Not tested Honey Thick Honey Thick Liquid: Not tested Puree Puree: Not tested Solid Solid: Within functional limits Presentation: Self Fed BSE Assessment Risk for Aspiration Impact on safety and function: Mild aspiration risk Other Related Risk Factors: Deconditioning;Decreased respiratory status  Short Term Goals: Week 1: SLP Short Term Goal 1 (Week 1): STG=LTG due to ELOS for ST  Refer to Care Plan for Long Term Goals  Recommendations for other services: None   Discharge Criteria: Patient will be discharged from SLP if patient refuses treatment 3 consecutive times without medical reason, if treatment goals not met, if there is a change in medical status, if patient makes no progress towards goals or if patient is discharged from hospital.  The above assessment, treatment plan, treatment alternatives and goals were discussed and mutually agreed upon: by patient  Arbutus Leas 01/14/2020, 10:22 AM

## 2020-01-14 NOTE — Progress Notes (Signed)
Inpatient Rehabilitation Care Coordinator Assessment and Plan  Patient Details  Name: Clifford Nichols MRN: [id number] Date of Birth: 1980-09-26  Today's Date: 01/14/2020  Problem List:  Patient Active Problem List   Diagnosis Date Noted  . Critical illness polyneuropathy (HCC) 01/13/2020  . Debility 01/13/2020  . Status post tracheostomy (HCC)   . Pressure injury of skin 12/27/2019  . Acute on chronic respiratory failure with hypoxia and hypercapnia (HCC)   . Acute respiratory failure (HCC)   . ARDS (adult respiratory distress syndrome) (HCC) 12/04/2019  . COVID-19 12/03/2019  . Acute respiratory failure due to COVID-19 (HCC) 12/02/2019  . Hyperglycemia 12/02/2019  . Diabetes (HCC) 12/02/2019  . HTN (hypertension) 04/27/2018   Past Medical History:  Past Medical History:  Diagnosis Date  . Abscess of upper back excluding scapular region   . Diabetes mellitus without complication (HCC)   . GERD (gastroesophageal reflux disease)    OCC-NO MEDS  . Hypertension   . Sebaceous cyst    Past Surgical History:  Past Surgical History:  Procedure Laterality Date  . IRRIGATION AND DEBRIDEMENT SEBACEOUS CYST Right 07/11/2018   Procedure: EXCISION  SEBACEOUS CYST POSTERIOR SHOULDER AND MID-BACK;  Surgeon: Ancil Linsey, MD;  Location: [facility] ORS;  Service: General;  Laterality: Right;  . NO PAST SURGERIES     Social History:  reports that he quit smoking about 2 years ago. His smoking use included cigarettes. He smoked 0.50 packs per day. He has never used smokeless tobacco. He reports previous alcohol use. He reports that he does not use drugs.  Family / Support Systems Patient Roles: Spouse Spouse/Significant Other: [place] Children: 3 (2 teens) Anticipated Caregiver: spouse Ability/Limitations of Caregiver: none Caregiver Availability: 24/7  Social History Preferred language: English Religion: None Read: Yes Write: Yes   Abuse/Neglect Abuse/Neglect Assessment Can Be  Completed: Yes Physical Abuse: Denies Verbal Abuse: Denies Sexual Abuse: Denies Exploitation of patient/patient's resources: Denies Self-Neglect: Denies  Emotional Status Pt's affect, behavior and adjustment status: patient in good spirits Recent Psychosocial Issues: no Psychiatric History: no Substance Abuse History: no  Patient / Family Perceptions, Expectations & Goals Pt/Family understanding of illness & functional limitations: yes Pt/family expectations/goals: Goal to discharge home with spouse  [employer]: None Transportation available at discharge: Family able to transport  Discharge Planning Living Arrangements: Spouse/significant other, Children Support Systems: Spouse/significant other, Children Type of Residence: Private residence (Patient lives on 10th floor) Insurance Resources:  Herbalist) Does the patient have any problems obtaining your medications?: No Care Coordinator Barriers to Discharge: Trach Care Coordinator Barriers to Discharge Comments: patient lives on 10th floor Care Coordinator Anticipated Follow Up Needs: HH/OP Expected length of stay: 12-16 Days  Clinical Impression Patient entered room, introduced self, explained role and process. Patient at bedside having lunch.No questions or concerns, will follow up with patient tomorrow.  Andria Rhein 01/14/2020, 2:12 PM

## 2020-01-14 NOTE — Evaluation (Signed)
Physical Therapy Assessment and Plan  Patient Details  Name: Clifford Nichols MRN: [id number] Date of Birth: 06-Dec-1980  PT Diagnosis: Abnormal posture, Abnormality of gait, Difficulty walking, Hypotonia, Impaired sensation, Muscle weakness and Paraplegia Rehab Potential: Good ELOS: ~ 2 weeks   Today's Date: 01/14/2020 PT Individual Time: 8242-3536 PT Individual Time Calculation (min): 81 min    Problem List:  Patient Active Problem List   Diagnosis Date Noted  . Critical illness polyneuropathy ([place]) 01/13/2020  . Debility 01/13/2020  . Status post tracheostomy ([place])   . Pressure injury of skin 12/27/2019  . Acute on chronic respiratory failure with hypoxia and hypercapnia (HCC)   . Acute respiratory failure ([place])   . ARDS (adult respiratory distress syndrome) ([place]) 12/04/2019  . COVID-19 12/03/2019  . Acute respiratory failure due to COVID-19 ([place]) 12/02/2019  . Hyperglycemia 12/02/2019  . Diabetes ([place]) 12/02/2019  . HTN (hypertension) 04/27/2018    Past Medical History:  Past Medical History:  Diagnosis Date  . Abscess of upper back excluding scapular region   . Diabetes mellitus without complication ([place])   . GERD (gastroesophageal reflux disease)    OCC-NO MEDS  . Hypertension   . Sebaceous cyst    Past Surgical History:  Past Surgical History:  Procedure Laterality Date  . IRRIGATION AND DEBRIDEMENT SEBACEOUS CYST Right 07/11/2018   Procedure: EXCISION  SEBACEOUS CYST POSTERIOR SHOULDER AND MID-BACK;  Surgeon: [name], MD;  Location: [facility] ORS;  Service: General;  Laterality: Right;  . NO PAST SURGERIES      Assessment & Plan Clinical Impression: Patient is a 39 y.o. year old male with history of HTN--no meds X couple of months, untreated OSA, morbid obesity who was admitted on05/10/21 with acute respiratory failure and ARDS from Covid 19 PNA. He required intubation and was treated with Tocilizumab, Remdesivir, Decadron as well as broad spectrum  antibiotics. He was not felt to be a candidate for ECMO due to markedly elevated BMI and treated with proning as well as Ketamine. Septic shock with BC positive for strep hominis and staph epi as well as HCAP--BAL 5/28 positive for proteus, pseudomonas, acinetobacter and enterobacter . He was treated with multiple antibiotics since admission ---last Zosyn/Vanc. ID consulted for input on ongoing fevers and felt that +BC likely due to contaminant and Vancomycin d/c by 6/01. He has had intermittent left proptosis noted and CT head negative-->has been a chronic problem per wife. To follow up with opthalmology post discharge. He developed anemia with thrombocytopenia and was transfused with 2 unita PRBC for drop in H/H to 6.4/24.5  He required tracheostomy 05/28 and extubated to ATC. He was downsized to cuffless but changed back to cuffed #6 XLT on 06/11 due need for PPS vent at nights. Respiratory status stable and he has been weaned off BIPAP--to sleep with HOB elevated per Pulmonary. He is tolerating PMSV during the day with 5L oxygen per ATC and has been refusing CPAP. 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 Pain bilateral feet felt to be due to plantar fascitis and on oxycodone every 4 hours for pain control? Has been advanced to regular with thin liquids and tolerating this without difficulty. Therapy ongoing and CIR recommended due to functional deficits. Patient transferred to CIR on 01/13/2020 .   Patient currently requires mod assist with mobility secondary to muscle weakness and muscle paralysis, decreased cardiorespiratoy endurance and decreased oxygen support, impaired timing and sequencing, abnormal tone, unbalanced muscle activation, decreased coordination and decreased motor planning and  decreased sitting balance, decreased standing balance, decreased postural control and decreased balance strategies.  Prior to hospitalization, patient was independent  with mobility and lived with  Family, Spouse (wife & 3 children (54, 64, and 69y.o.)) in a Apartment.  Home access is 20Stairs to enter.  Patient will benefit from skilled PT intervention to maximize safe functional mobility, minimize fall risk and decrease caregiver burden for planned discharge home with 24 hour supervision.  Anticipate patient will benefit from follow up [place] at discharge.  PT - End of Session Activity Tolerance: Tolerates 30+ min activity with multiple rests Endurance Deficit: Yes Endurance Deficit Description: requires frequent seated rest breaks due to fatigue PT Assessment Rehab Potential (ACUTE/IP ONLY): Good PT Barriers to Discharge: [place] home environment;Decreased caregiver support;Home environment access/layout;Trach;Medical stability;New oxygen;Weight;Wound Care PT Barriers to Discharge Comments: 20 STE home PT Patient demonstrates impairments in the following area(s): Balance;Perception;Behavior;Safety;Edema;Sensory;Endurance;Skin Integrity;Motor;Nutrition;Pain PT Transfers Functional Problem(s): Bed Mobility;Bed to Chair;Furniture;Car PT Locomotion Functional Problem(s): Ambulation;Wheelchair Mobility;Stairs PT Plan PT Intensity: Minimum of 1-2 x/day ,45 to 90 minutes PT Frequency: 5 out of 7 days PT Duration Estimated Length of Stay: ~ 2 weeks PT Treatment/Interventions: Ambulation/gait training;Community reintegration;DME/adaptive equipment instruction;Neuromuscular re-education;Psychosocial support;Stair training;UE/LE Strength taining/ROM;Wheelchair propulsion/positioning;Balance/vestibular training;Discharge planning;Pain management;Functional electrical stimulation;Skin care/wound management;Therapeutic Activities;UE/LE Coordination activities;Cognitive remediation/compensation;Disease management/prevention;Functional mobility training;Patient/family education;Splinting/orthotics;Therapeutic Exercise;Visual/perceptual remediation/compensation PT Transfers Anticipated Outcome(s):  supervision PT Locomotion Anticipated Outcome(s): supervision with LRAD PT Recommendation Recommendations for Other Services: Therapeutic Recreation consult Therapeutic Recreation Interventions: Stress management;Outing/community reintergration Follow Up Recommendations: Home health PT;24 hour supervision/assistance Patient destination: Home Equipment Recommended: To be determined;Rolling walker with 5" wheels  Skilled Therapeutic Intervention Evaluation completed (see details above and below) with education on PT POC and goals and individual treatment initiated with focus on activity tolerance/cardiovascular endurance, bed mobility, functional transfers,, gait training, stair navigation, and pt education regarding daily therapy schedule, weekly team meetings, purpose of PT evaluation, and other CIR information. Pt received prone in bed and agreeable to therapy session. Pt received on 5L via trach collar and PMV donned throughout session; maintained on portable O2 tank - SpO2 >99% throughout and HR increasing to 114bpm with activity and 103bpm at rest at beginning of session. Prone>sidelying>sitting EOB with distant supervision demonstrating safe mobility with this task. Patient noted to have 0/5 MMT in bilateral ankle DF and PF - educated on importance of maintaining neutral ankle alignment during the day to maintain ROM with plan for bracing as appropriate for mobility tasks. Stand pivot EOB>w/c, no AD, with min assist for balance.  Transported to/from gym in w/c for time management and energy conservation. Gait training 150f using RW with min assist for balance and +2 assist for O2 line management and w/c follow - demonstrates high steppage gait due to bilateral foot drop in order to clear feet during swing phase and with fatigue has instability noted in knees with pt locking into extension for stability but no buckling - increased B UE support on RW. Ascended/descended 2 steps (6" height) using B HRs  with mod assist of 1 for lifting/lowering and balance with +2 present for safety and line management - pt has significant difficulty placing R LE onto and off of step requiring multiple attempts then manual facilitation to move - ascends forward then descends backwards. Stepped up/down 1 step (6" height) using B HRs with same assist as before. Pt reports that his R LE has gone "numb" after performing these tasks - remains numb for remainder of session. Ambulatory simulated car transfer (sedan height) using  RW with min assist of 1 for balance - pt realized stepping into car sideways may not be safe but required increased time to problem solve alternate technique. Gait training 34f using RW with min assist of 1 and +2 for O2 line management and w/c follow - demonstrates same gait deviations as above. Transported back to room. R stand pivot w/c>EOB using RW with min assist for balance. Demonstrates ability to perform sit>supine via use of bed features mod-I. Pt left seated EOB with needs in reach, bed alarm on, and RN present.  PT Evaluation Precautions/Restrictions Precautions Precautions: Fall Precaution Comments: monitor O2 and HR Restrictions Weight Bearing Restrictions: No Pain Pain Assessment Pain Scale: Faces Faces Pain Scale: Hurts a little bit Pain Type: Acute pain Pain Location: Foot Pain Orientation: Left;Right Pain Descriptors / Indicators: Constant Pain Onset: On-going Pain Intervention(s): Rest;Repositioned;Environmental changes Home Living/Prior Functioning Home Living Available Help at Discharge: Family;Available 24 hours/day (reports wife does not work) Type of Home: Apartment Home Access: Stairs to enter [employer] Steps: 20 Entrance Stairs-Rails: Left (rail on L and wall on R) Home Layout: One level Bathroom Shower/Tub: TChiropodist Standard Bathroom Accessibility: Yes  Lives With: Family;Spouse (wife & 3 children (11, 135 and  156yo.)) Prior Function Level of Independence: Independent with homemaking with ambulation;Independent with gait;Independent with transfers  Able to Take Stairs?: Yes Driving: Yes Vocation: Full time employment Vocation Requirements: security at [employer] PaLeisure: Hobbies-no Perception  Perception Perception: Within Functional Limits Praxis Praxis: Intact  Cognition Overall Cognitive Status: Within Functional Limits for tasks assessed Arousal/Alertness: Awake/alert Orientation Level: Oriented X4 Attention: Focused;Sustained Focused Attention: Appears intact Sustained Attention: Appears intact Selective Attention: Appears intact Awareness: Appears intact Problem Solving: Appears intact Safety/Judgment: Appears intact Sensation Sensation Light Touch: Impaired Detail Peripheral sensation comments: impaired distally in B LEs with pt reporting his legs go "numb" after prolong sitting in 1 position and reports R LE numbness with activity during session Light Touch Impaired Details: Impaired RLE;Impaired LLE Hot/Cold: Not tested Proprioception: Impaired by gross assessment Stereognosis: Not tested Additional Comments: Sensation intact in BUEs Coordination Gross Motor Movements are Fluid and Coordinated: No Fine Motor Movements are Fluid and Coordinated: Yes Coordination and Movement Description: impaired gross motor movements due to impaired B LE strength, impaired balance, and impaired sensation Motor  Motor Motor: Other (comment);Paraplegia;Abnormal tone Motor - Skilled Clinical Observations: Generalized weakness with significant impairment in B LE strength (especially at ankles)  Mobility Bed Mobility Bed Mobility: Supine to Sit;Rolling Right;Rolling Left;Sit to Supine Rolling Right: Independent with assistive device Rolling Left: Independent with assistive device Supine to Sit: Supervision/Verbal cueing Sit to Supine: Supervision/Verbal cueing Transfers Transfers: Sit to  Stand;Stand to Sit;Stand Pivot Transfers Sit to Stand: Minimal Assistance - Patient > 75% Stand to Sit: Minimal Assistance - Patient > 75% Stand Pivot Transfers: Minimal Assistance - Patient > 75% Stand Pivot Transfer Details: Tactile cues for sequencing;Verbal cues for precautions/safety;Verbal cues for technique;Verbal cues for sequencing Transfer (Assistive device): None Locomotion  Gait Ambulation: Yes Gait Assistance: 2 Helpers (min A of 1 and +2 assist O2 line management and w/c follow) Gait Distance (Feet): 116 Feet Assistive device: Rolling walker Gait Assistance Details: Tactile cues for sequencing;Verbal cues for technique;Verbal cues for safe use of DME/AE;Verbal cues for sequencing;Verbal cues for gait pattern;Verbal cues for precautions/safety Gait Gait: Yes Gait Pattern: Impaired Gait Pattern: Step-through pattern;Poor foot clearance - right;Poor foot clearance - left;Right steppage;Left steppage;Wide base of support;Decreased dorsiflexion - right;Decreased dorsiflexion - left (bilateral foot  drop) Gait velocity: decreased Stairs / Additional Locomotion Stairs: Yes Stairs Assistance: 2 Helpers (mod A of 1 and +2 for safety (needed in event pt unable to move LE on/off step)) Stair Management Technique: Two rails Number of Stairs: 2 Height of Stairs: 6 Wheelchair Mobility Wheelchair Mobility: No  Trunk/Postural Assessment  Cervical Assessment Cervical Assessment: Within Functional Limits Thoracic Assessment Thoracic Assessment: Exceptions to [facility] (rounded shoulders) Lumbar Assessment Lumbar Assessment: Within Functional Limits Postural Control Postural Control: Deficits on evaluation Protective Responses: unable to use ankle strategy to regain balance at this time due to paralysis Postural Limitations: decreased due to LE weakness and need to use UE support on RW  Balance Balance Balance Assessed: Yes Static Sitting Balance Static Sitting - Balance Support: Feet  supported Static Sitting - Level of Assistance: 5: Stand by assistance Dynamic Sitting Balance Dynamic Sitting - Balance Support: Feet supported Dynamic Sitting - Level of Assistance: 5: Stand by assistance;4: Min assist Static Standing Balance Static Standing - Balance Support: During functional activity;Bilateral upper extremity supported Static Standing - Level of Assistance: 4: Min assist Dynamic Standing Balance Dynamic Standing - Balance Support: During functional activity;Bilateral upper extremity supported Dynamic Standing - Level of Assistance: 4: Min assist;3: Mod assist Extremity Assessment      RLE Assessment RLE Assessment: Exceptions to [facility] RLE Strength Right Hip Flexion: 3+/5 Right Knee Flexion: 2/5 Right Knee Extension: 4-/5 Right Ankle Dorsiflexion: 0/5 Right Ankle Plantar Flexion: 0/5 RLE Tone RLE Tone: Hypotonic Hypotonic Details: in ankle PF and DF LLE Assessment LLE Assessment: Exceptions to [facility] LLE Strength Left Hip Flexion: 3+/5 Left Knee Flexion: 2/5 Left Knee Extension: 4-/5 Left Ankle Dorsiflexion: 0/5 Left Ankle Plantar Flexion: 0/5 LLE Tone LLE Tone: Hypotonic Hypotonic Details: in ankle PF and DF    Refer to Care Plan for Long Term Goals  Recommendations for other services: Therapeutic Recreation  Stress management and Outing/community reintegration  Discharge Criteria: Patient will be discharged from PT if patient refuses treatment 3 consecutive times without medical reason, if treatment goals not met, if there is a change in medical status, if patient makes no progress towards goals or if patient is discharged from hospital.  The above assessment, treatment plan, treatment alternatives and goals were discussed and mutually agreed upon: by patient  Tawana Scale, PT, DPT 01/14/2020, 12:08 PM

## 2020-01-14 NOTE — Progress Notes (Signed)
Physical Therapy Wound Treatment Patient Details  Name: Clifford Nichols MRN: [id number] Date of Birth: 1980-11-28  Today's Date: 01/14/2020 PT Individual Time: 1122-1201 PT Individual Time Calculation (min): 39 min   Subjective  Subjective: Pleasant and agreeable to hydrotherapy Patient and Family Stated Goals: get back to his family Prior Treatments: dressing change, santyl  Pain Score:  Pt reports 3/10 pain with completion of wound treatment today.  Wound Assessment  Pressure Injury 12/26/19 Sacrum Mid Stage 3 -  Full thickness tissue loss. Subcutaneous fat may be visible but bone, tendon or muscle are NOT exposed. Initial wound with Eschar=unstageable; eschar was removed and hydrotherapy/santyl initiated; now consid (Active)  Wound Image   01/08/20 1400  Dressing Type ABD;Gauze (Comment) 01/14/20 1300  Dressing Changed;Clean;Intact;Dry 01/14/20 1300  Dressing Change Frequency Daily 01/14/20 1300  State of Healing Early/partial granulation 01/14/20 1300  Site / Wound Assessment Pink;Yellow;Granulation tissue 01/14/20 1300  % Wound base Red or Granulating 30% 01/14/20 1300  % Wound base Yellow/Fibrinous Exudate 70% 01/14/20 1300  % Wound base Black/Eschar 0% 01/14/20 1300  % Wound base Other/Granulation Tissue (Comment) 0% 01/14/20 1300  Peri-wound Assessment Intact 01/14/20 1300  Wound Length (cm) 7.5 cm 01/14/20 1300  Wound Width (cm) 3.8 cm 01/14/20 1300  Wound Depth (cm) 2.8 cm 01/14/20 1300  Wound Surface Area (cm^2) 28.5 cm^2 01/14/20 1300  Wound Volume (cm^3) 79.8 cm^3 01/14/20 1300  Margins Unattached edges (unapproximated) 01/14/20 1300  Drainage Amount Minimal 01/14/20 1300  Drainage Description Serosanguineous 01/14/20 1300  Treatment Debridement (Selective);Hydrotherapy (Pulse lavage);Packing (Saline gauze) 01/14/20 1300  Santyl Applied to necrotic tissue.   Hydrotherapy Pulsed lavage therapy - wound location: sacrum Pulsed Lavage with Suction (psi): 12 psi Pulsed  Lavage with Suction - Normal Saline Used: 1000 mL Pulsed Lavage Tip: Tip with splash shield Selective Debridement Selective Debridement - Location: sacrum Selective Debridement - Tools Used: Scalpel;Forceps Selective Debridement - Tissue Removed: yellow necrotic tissue   Wound Assessment and Plan  Wound Therapy - Assess/Plan/Recommendations Wound Therapy - Clinical Statement: Pt reports he is glad to be in clothing and is able to transfer from wheelchair to bed with min A and RW. Once in bed pt able to independently roll onto R side. Pt wound bed is showing good granulation tissue growth and should soon be able to progress to wound vac placement. Until vac placement pt will continue to benefit from pulse lavage and selective debridement to reduce bioburden to facilitate wound healing.  Wound Therapy - Functional Problem List: continues to need mechanical ventilation at night Factors Delaying/Impairing Wound Healing: Multiple medical problems Hydrotherapy Plan: Debridement;Dressing change;Patient/family education;Pulsatile lavage with suction Wound Therapy - Frequency: 6X / week Wound Therapy - Follow Up Recommendations: [place] Wound Plan: see above  Wound Therapy Goals- Improve the function of patient's integumentary system by progressing the wound(s) through the phases of wound healing (inflammation - proliferation - remodeling) by: Decrease Necrotic Tissue to: 25 Decrease Necrotic Tissue - Progress: Progressing toward goal Increase Granulation Tissue to: 75 Increase Granulation Tissue - Progress: Progressing toward goal Goals/treatment plan/discharge plan were made with and agreed upon by patient/family: Yes Time For Goal Achievement: 7 days Wound Therapy - Potential for Goals: Excellent  Goals will be updated until maximal potential achieved or discharge criteria met.  Discharge criteria: when goals achieved, discharge from hospital, MD decision/surgical intervention, no  progress towards goals, refusal/missing three consecutive treatments without notification or medical reason.  GP    Dani Gobble. Migdalia Dk PT, DPT  Acute Rehabilitation Services Pager [phone] Office [phone]  [place] 01/14/2020, 1:59 PM

## 2020-01-14 NOTE — Progress Notes (Signed)
Inpatient Rehabilitation Center Individual Statement of Services  Patient Name:  Clifford Nichols  Date:  01/14/2020  Welcome to the Inpatient Rehabilitation Center.  Our goal is to provide you with an individualized program based on your diagnosis and situation, designed to meet your specific needs.  With this comprehensive rehabilitation program, you will be expected to participate in at least 3 hours of rehabilitation therapies Monday-Friday, with modified therapy programming on the weekends.  Your rehabilitation program will include the following services:  Physical Therapy (PT), Occupational Therapy (OT), Speech Therapy (ST), 24 hour per day rehabilitation nursing, Therapeutic Recreaction (TR), Neuropsychology, Care Coordinator, Rehabilitation Medicine, Nutrition Services, Pharmacy Services and Other  Weekly team conferences will be held on Wednesdays to discuss your progress.  Your Inpatient Rehabilitation Care Coordinator will talk with you frequently to get your input and to update you on team discussions.  Team conferences with you and your family in attendance may also be held.  Expected length of stay: 12-16 Days  Overall anticipated outcome: MOD I to Supervision  Depending on your progress and recovery, your program may change. Your Inpatient Rehabilitation Care Coordinator will coordinate services and will keep you informed of any changes. Your Inpatient Rehabilitation Care Coordinator's name and contact numbers are listed  below.  The following services may also be recommended but are not provided by the Inpatient Rehabilitation Center:    Home Health Rehabiltiation Services  Outpatient Rehabilitation Services    Arrangements will be made to provide these services after discharge if needed.  Arrangements include referral to agencies that provide these services.  Your insurance has been verified to be:  BCBC (Non-Participating)  Your primary doctor is:  [facility]  Pertinent information will be shared with your doctor and your insurance company.  Inpatient Rehabilitation Care Coordinator:  Lavera Guise, [place] [phone] or [phone]  Information discussed with and copy given to patient by: Andria Rhein, 01/14/2020, 10:04 AM

## 2020-01-14 NOTE — Progress Notes (Signed)
Initial Nutrition Assessment  DOCUMENTATION CODES:   Morbid obesity  INTERVENTION:   - Ensure Max po BID, each supplement provides 150 kcal and 30 grams of protein  - Magic Cup TID with meals, each supplement provides 290 kcal and 9 grams of protein  -1 packet Juven BID, each packet provides 95 calories, 2.5 grams of protein, and 9.8 grams of carbohydrate; also contains 7 grams of L-arginine and L-glutamine, 300 mg vitamin C, 15 mg vitamin E, 1.2 mcg vitamin B-12, 9.5 mg zinc, 200 mg calcium, and 1.5 g calcium Beta-hydroxy-Beta-methylbutyrate to support wound healing  - MVI with minerals daily  NUTRITION DIAGNOSIS:   Increased nutrient needs related to wound healing as evidenced by estimated needs.  GOAL:   Patient will meet greater than or equal to 90% of their needs  MONITOR:   PO intake, Supplement acceptance, Labs, Weight trends, Skin, I & O's  REASON FOR ASSESSMENT:   Malnutrition Screening Tool    ASSESSMENT:   39 year old male with history of HTN, untreated OSA, morbid obesity who was admitted on 12/02/19 with acute respiratory failure and ARDS from COVID-19 PNA. He required intubation. Pt required tracheostomy 5/28 and extubated to ATC. Pt required Cortrak tube for enteral nutrition which has since been removed. Diet has been advanced to regular with thin liquids and pt is tolerating this without difficulty. Admitted to CIR on 6/21.   Spoke with pt at bedside. Pt had finished lunch at time of RD visit and had completed ~75% of meal tray.  Pt reports appetite is fine and he is eating "just enough." Pt states that his appetite is not what it was PTA but that he feels like he is eating enough.  Pt states that he does not think that he has lost any weight but that other people have commented that he has lost weight. Reviewed weight history in chart. Pt with an 11.9 kg weight loss since 12/04/19. This is a 6.9% weight loss in less than 2 months which is significant for  timeframe. Pt is at risk for malnutrition.  Pt willing to consume oral nutrition supplements to aid in meeting kcal and protein needs. Will also order daily MVI.  Pt receiving hydrotherapy for sacral wound. Discussed importance of adequate protein intake in wound healing.  Meal Completion: 100%  Medications reviewed and include: vitamin C 500 mg daily, SSI, Levemir 5 units daily, Klor-con 20 mEq daily, zinc sulfate 220 mg daily  Labs reviewed. CBG's: 99-124 x 24 hours  Diet Order:   Diet Order            Diet Carb Modified Fluid consistency: Thin; Room service appropriate? Yes  Diet effective now                 EDUCATION NEEDS:   Education needs have been addressed  Skin:  Skin Assessment: Skin Integrity Issues: DTI: left side of tongue Stage III: sacrum  Last BM:  01/13/20 large type 4  Height:   Ht Readings from Last 1 Encounters:  01/10/20 5\' 7"  (1.702 m)    Weight:   Wt Readings from Last 1 Encounters:  01/13/20 (!) 160.1 kg    Ideal Body Weight:  67.3 kg  BMI:  Body mass index is 55.29 kg/m.  Estimated Nutritional Needs:   Kcal:  2400-2600  Protein:  135-160 grams  Fluid:  >/= 2.2 L    01/15/20, MS, RD, LDN Inpatient Clinical Dietitian Pager: [phone] Weekend/After Hours: [phone]

## 2020-01-14 NOTE — Evaluation (Signed)
Occupational Therapy Assessment and Plan  Patient Details  Name: Clifford Nichols MRN: [id number] Date of Birth: 11-23-1980  OT Diagnosis: muscle weakness (generalized) Rehab Potential: Rehab Potential (ACUTE ONLY): Excellent ELOS: 10-12 days   Today's Date: 01/14/2020 OT Individual Time: 4920-1007 OT Individual Time Calculation (min): 56 min     Hospital Problem: Active Problems:   Critical illness polyneuropathy ([place])   Debility   Past Medical History:  Past Medical History:  Diagnosis Date  . Abscess of upper back excluding scapular region   . Diabetes mellitus without complication ([place])   . GERD (gastroesophageal reflux disease)    OCC-NO MEDS  . Hypertension   . Sebaceous cyst    Past Surgical History:  Past Surgical History:  Procedure Laterality Date  . IRRIGATION AND DEBRIDEMENT SEBACEOUS CYST Right 07/11/2018   Procedure: EXCISION  SEBACEOUS CYST POSTERIOR SHOULDER AND MID-BACK;  Surgeon: [name], MD;  Location: [facility] ORS;  Service: General;  Laterality: Right;  . NO PAST SURGERIES      Assessment & Plan Clinical Impression: Patient is a 39 y.o. year old male with recent admission to the hospital on 12/02/19 with acute respiratory failure and ARDS from Covid 19 PNA. He required intubation and was treated with Tocilizumab, Remdesivir, Decadron as well as broad spectrum antibiotics. He was not felt to be a candidate for ECMO due to markedly elevated BMI and treated with proning as well as Ketamine. Septic shock with BC positive for strep hominis and staph epi as well as HCAP--BAL 5/28 positive for proteus, pseudomonas, acinetobacter and enterobacter . He was treated with multiple antibiotics since admission ---last Zosyn/Vanc. ID consulted for input on ongoing fevers and felt that +BC likely due to contaminant and Vancomycin d/c by 6/01. He has had intermittent left proptosis noted and CT head negative-->has been a chronic problem per wife. To follow up with  opthalmology post discharge. He developed anemia with thrombocytopenia and was transfused with 2 unita PRBC for drop in H/H to 6.4/24.5.  He required tracheostomy 05/28 and extubated to ATC. Patient was transferred to CIR on 01/13/2020 .    Patient currently requires mod with basic self-care skills secondary to muscle weakness, decreased cardiorespiratoy endurance and decreased oxygen support and decreased standing balance and decreased balance strategies.  Prior to hospitalization, patient could complete ADLs and IADLs with independent .  Patient will benefit from skilled intervention to decrease level of assist with basic self-care skills and increase independence with basic self-care skills prior to discharge home with care partner.  Anticipate patient will require intermittent supervision and follow up home health.  OT - End of Session Activity Tolerance: Tolerates < 10 min activity with changes in vital signs Endurance Deficit: Yes OT Assessment Rehab Potential (ACUTE ONLY): Excellent OT Barriers to Discharge: Inaccessible home environment OT Barriers to Discharge Comments: Needs to negotiate 20 steps consecutively to get up to his apartment OT Patient demonstrates impairments in the following area(s): Balance;Endurance OT Basic ADL's Functional Problem(s): Grooming;Bathing;Dressing;Toileting OT Transfers Functional Problem(s): Toilet;Tub/Shower OT Additional Impairment(s): None OT Plan OT Intensity: Minimum of 1-2 x/day, 45 to 90 minutes OT Frequency: 5 out of 7 days OT Duration/Estimated Length of Stay: 10-12 days OT Treatment/Interventions: Balance/vestibular training;Discharge planning;Pain management;Self Care/advanced ADL retraining;Therapeutic Activities;UE/LE Coordination activities;Disease mangement/prevention;Functional mobility training;Patient/family education;Therapeutic Exercise;DME/adaptive equipment instruction;Community reintegration;Neuromuscular re-education;UE/LE  Strength taining/ROM OT Self Feeding Anticipated Outcome(s): independent OT Basic Self-Care Anticipated Outcome(s): supervision OT Toileting Anticipated Outcome(s): supervision OT Bathroom Transfers Anticipated Outcome(s): supervision OT Recommendation Patient destination: Home  Follow Up Recommendations: Home health OT;24 hour supervision/assistance Equipment Recommended: To be determined   Skilled Therapeutic Intervention Pt seen for selfcare retraining sit to stand from the EOB.  He was able to maintain O2 sats at 95% or better on room air during bathing and dressing tasks.  He completed all UB selfcare with setup assist.  Mod assist was needed for LB bathing with pt demonstrating decreased ability to reach either lower leg for washing or for washing his buttocks.  He also needed assist with threading and pulling up his shorts as well as for donning his gripper socks.  HR increased to 115 after standing when washing peri area.  He was able to complete stand pivot transfer to the wheelchair with min assist using the RW for support.  Will benefit from AE for LB selfcare until his strength and flexibility improve.  Finished session with pt in the wheelchair with call button and phone in reach.    OT Evaluation Precautions/Restrictions  Precautions Precautions: Fall Precaution Comments: monitor O2 and HR Restrictions Weight Bearing Restrictions: No   Vital Signs Therapy Vitals Temp: 97.8 F (36.6 C) Pulse Rate: 99 Resp: 16 BP: 108/71 Patient Position (if appropriate): Lying Oxygen Therapy SpO2: 100 % O2 Device: Tracheostomy Collar Pain  No report of pain during session   Home Living/Prior [place] expects to be discharged to:: Private residence Living Arrangements: Spouse/significant other, Children Available Help at Discharge: Family Type of Home: Apartment Home Access: Stairs to enter Technical brewer of Steps: 20 Home Layout: One  level Bathroom Shower/Tub: Optometrist: Yes  Lives With: Family, Spouse IADL History Homemaking Responsibilities: No Current License: Yes Occupation: Full time employment Type of Occupation: Works in [employer] Level of Independence: Independent with basic ADLs  Able to Take Stairs?: Yes Driving: Yes Vocation: Full time employment Leisure: Hobbies-no ADL ADL Eating: Independent Where Assessed-Eating: Wheelchair Grooming: Setup Where Assessed-Grooming: Wheelchair Upper Body Bathing: Setup Where Assessed-Upper Body Bathing: Edge of bed Lower Body Bathing: Moderate assistance Where Assessed-Lower Body Bathing: Edge of bed Upper Body Dressing: Setup Where Assessed-Upper Body Dressing: Edge of bed Lower Body Dressing: Maximal assistance Where Assessed-Lower Body Dressing: Edge of bed Toileting: Maximal assistance Where Assessed-Toileting: Bed level Toilet Transfer: Minimal assistance Toilet Transfer Method: Counselling psychologist: Grab bars, Raised toilet seat Vision Baseline Vision/History: No visual deficits Patient Visual Report: No change from baseline Vision Assessment?: No apparent visual deficits Perception  Perception: Within Functional Limits Praxis Praxis: Intact Cognition Overall Cognitive Status: Within Functional Limits for tasks assessed Arousal/Alertness: Awake/alert Orientation Level: Person;Place;Situation Person: Oriented Situation: Oriented Year: 2021 Month: June Day of Week: Correct Memory: Appears intact Immediate Memory Recall: Sock;Blue;Bed Memory Recall Sock: Without Cue Memory Recall Blue: Without Cue Memory Recall Bed: Not able to recall Attention: Selective Selective Attention: Appears intact Awareness: Appears intact Problem Solving: Appears intact Safety/Judgment: Appears intact Sensation Sensation Light Touch: Appears Intact Hot/Cold:  Appears Intact Proprioception: Appears Intact Stereognosis: Appears Intact Additional Comments: Sensation intact in BUEs Coordination Gross Motor Movements are Fluid and Coordinated: Yes Fine Motor Movements are Fluid and Coordinated: Yes Motor  Motor Motor - Skilled Clinical Observations: Generalized weakness Mobility  Bed Mobility Bed Mobility: Supine to Sit Supine to Sit: Supervision/Verbal cueing (use of rails) Transfers Sit to Stand: Minimal Assistance - Patient > 75% Stand to Sit: Contact Guard/Touching assist  Trunk/Postural Assessment  Cervical Assessment Cervical Assessment: Within Functional Limits Thoracic Assessment Thoracic Assessment: Exceptions  to [facility] (rounded shoulders) Lumbar Assessment Lumbar Assessment: Within Functional Limits  Balance Balance Balance Assessed: Yes Static Sitting Balance Static Sitting - Balance Support: Feet supported Static Sitting - Level of Assistance: 5: Stand by assistance Dynamic Sitting Balance Dynamic Sitting - Balance Support: Feet supported Dynamic Sitting - Level of Assistance: 4: Min assist (contact guard) Static Standing Balance Static Standing - Balance Support: During functional activity Static Standing - Level of Assistance: 4: Min assist Dynamic Standing Balance Dynamic Standing - Balance Support: Bilateral upper extremity supported;During functional activity Dynamic Standing - Level of Assistance: 4: Min assist Extremity/Trunk Assessment RUE Assessment Passive Range of Motion (PROM) Comments: WFLS Active Range of Motion (AROM) Comments: WFLs General Strength Comments: shoulder 4-/5, elbow flexion/extension and grip WFLs LUE Assessment LUE Assessment: Exceptions to [facility] Active Range of Motion (AROM) Comments: WFLs General Strength Comments: shoulder flexion 3+/5, all other joints 4/5 throughout     Refer to Care Plan for Long Term Goals  Recommendations for other services: None    Discharge Criteria: Patient  will be discharged from OT if patient refuses treatment 3 consecutive times without medical reason, if treatment goals not met, if there is a change in medical status, if patient makes no progress towards goals or if patient is discharged from hospital.  The above assessment, treatment plan, treatment alternatives and goals were discussed and mutually agreed upon: by patient  Kaleisha Bhargava OTR/L 01/14/2020, 4:52 PM

## 2020-01-14 NOTE — Plan of Care (Signed)
  Problem: Activity: Goal: Ability to tolerate increased activity will improve 01/14/2020 1329 by Marland Mcalpine, RN Outcome: Progressing 01/14/2020 1328 by Marland Mcalpine, RN Outcome: Progressing   Problem: RH SKIN INTEGRITY Goal: RH STG SKIN FREE OF INFECTION/BREAKDOWN Outcome: Progressing   Problem: RH KNOWLEDGE DEFICIT GENERAL Goal: RH STG INCREASE KNOWLEDGE OF SELF CARE AFTER HOSPITALIZATION Description: Patient will be able to direct care at discharge using educational resources independently Outcome: Progressing

## 2020-01-15 ENCOUNTER — Inpatient Hospital Stay (HOSPITAL_COMMUNITY): Payer: BLUE CROSS/BLUE SHIELD | Admitting: Physical Therapy

## 2020-01-15 ENCOUNTER — Inpatient Hospital Stay (HOSPITAL_COMMUNITY): Payer: BLUE CROSS/BLUE SHIELD | Admitting: Occupational Therapy

## 2020-01-15 LAB — GLUCOSE, CAPILLARY
Glucose-Capillary: 111 mg/dL — ABNORMAL HIGH (ref 70–99)
Glucose-Capillary: 83 mg/dL (ref 70–99)
Glucose-Capillary: 81 mg/dL (ref 70–99)
Glucose-Capillary: 119 mg/dL — ABNORMAL HIGH (ref 70–99)

## 2020-01-15 MED ORDER — METFORMIN HCL 500 MG PO TABS
500.0000 mg | ORAL_TABLET | Freq: Every day | ORAL | Status: DC
  Administered 2020-01-15 – 2020-01-24 (×10): 500 mg via ORAL
  Filled 2020-01-15 (×10): qty 1

## 2020-01-15 MED ORDER — GABAPENTIN 400 MG PO CAPS
400.0000 mg | ORAL_CAPSULE | Freq: Three times a day (TID) | ORAL | Status: DC
  Administered 2020-01-15 – 2020-01-16 (×5): 400 mg via ORAL
  Filled 2020-01-15 (×5): qty 1

## 2020-01-15 NOTE — Consult Note (Signed)
WOC Nurse wound follow up Patient receiving care in [facility] 4M12. Wound type: stage 3 Measurement: Wound bed: 99% pink, clean, granulation tissue Drainage (amount, consistency, odor)  Periwound: Dressing procedure/placement/frequency: PT hydrotherapy is ending today.  I have modified the santyl order for primary nursing staff to do daily. Will follow up next week to evaluate status of wound. Monitor the wound area(s) for worsening of condition such as: Signs/symptoms of infection,  Increase in size,  Development of or worsening of odor, Development of pain, or increased pain at the affected locations.  Notify the medical team if any of these develop. Helmut Muster, RN, MSN, CWOCN, CNS-BC, pager [phone]

## 2020-01-15 NOTE — Progress Notes (Signed)
Occupational Therapy Session Note  Patient Details  Name: Clifford Nichols MRN: [id number] Date of Birth: 03-Oct-1980  Today's Date: 01/15/2020 OT Individual Time: 3299-2426 OT Individual Time Calculation (min): 47 min    Short Term Goals: Week 1:  OT Short Term Goal 1 (Week 1): Pt will complete LB bathing sit to stand with min assist using AE PRN. OT Short Term Goal 2 (Week 1): Pt will complete LB dressing sit to stand with supervision using AE PRN. OT Short Term Goal 3 (Week 1): Pt will maintain standing at the sink during grooming tasks for at least 3 mins with close supervision in order to increase overall endurance. OT Short Term Goal 4 (Week 1): Pt will complete toilet transfers with use of the RW and close supervision to elevated toilet.  Skilled Therapeutic Interventions/Progress Updates:    Pt completed working on integration of AE for LB selfcare in sitting EOB.  He was able to use a reacher for removal of gripper socks and regular socks with supervision and min instructional cueing.  He was also able to use the wide sockaide for donning his regular socks and gripper socks with supervision.  Attempted donning shoes with use of the reacher and the shoe funnel, but he still needs max assist secondary to the shoes being on the smaller end.  Instructed pt on needing a larger pair of shoes in order to accommodate use of AFOs in the future as well.  He was able to complete stand pivot transfer to the wheelchair with min assist using the RW. Oxygen sats at 94% on 5Ls trach collar at 28%.  Had him work in standing on marching in place, and completing of simple squats with use of the RW for support for reps of 5 (squats) 10 (marches in place).  Pt left with call button and phone in reach with safety belt in place.    Therapy Documentation Precautions:  Precautions Precautions: Fall Precaution Comments: monitor O2 and HR Restrictions Weight Bearing Restrictions: No   Vital Signs: Therapy  Vitals Pulse Rate: 95 Resp: 20 Patient Position (if appropriate): Lying Oxygen Therapy SpO2: 97 % O2 Device: Tracheostomy Collar O2 Flow Rate (L/min): 5 L/min FiO2 (%): 28 % Pain: Pain Assessment Pain Scale: Faces Pain Score: 0-No pain ADL: See Care Tool Section for some details regarding selfcare and mobility  Therapy/Group: Individual Therapy  Dajohn Ellender OTR/L 01/15/2020, 10:34 AM

## 2020-01-15 NOTE — Progress Notes (Signed)
Pt resting in bed at this time. Trach collar in place. Pt denies needing suction at this time   Inner cannula changed 01/14/20 at 2330 by Alecia Lemming, RRT

## 2020-01-15 NOTE — Progress Notes (Signed)
Occupational Therapy Session Note  Patient Details  Name: Clifford Nichols MRN: [id number] Date of Birth: 06/19/1981  Today's Date: 01/15/2020 OT Individual Time: 0930-1030 OT Individual Time Calculation (min): 60 min    Short Term Goals: Week 1:  OT Short Term Goal 1 (Week 1): Pt will complete LB bathing sit to stand with min assist using AE PRN. OT Short Term Goal 2 (Week 1): Pt will complete LB dressing sit to stand with supervision using AE PRN. OT Short Term Goal 3 (Week 1): Pt will maintain standing at the sink during grooming tasks for at least 3 mins with close supervision in order to increase overall endurance. OT Short Term Goal 4 (Week 1): Pt will complete toilet transfers with use of the RW and close supervision to elevated toilet.  Skilled Therapeutic Interventions/Progress Updates:    1:1 Pt paricipated in bathing and dressing sit to stand from the EOB. Pt performed sit to stand and stand pivot with RW with min A with extra time. Pt able to doff shirt and short with lateral leans with min guard. A still needed with washing lower LEs and buttocks today in standing. Pt with elevated HR with all activities. Pt able to wash periarea in sitting as well and the rest of his body. Facility out of long hand sponges so unable to provide AE to assist with washing LEs. PT continues with limited range of left Ue due to decr strength noted in donning deodorant  and donning shirt. Pt able to thread pants with reacher and don socks with wide sock aide Pt performed oral care with setup at bedside. Return to supine with HOB up to rest with ability to raise both LEs into bed but with great effort.   Therapy Documentation Precautions:  Precautions Precautions: Fall Precaution Comments: monitor O2 and HR Restrictions Weight Bearing Restrictions: No General:   Vital Signs: Pt able to tolerate RA during session and maintain sats >97%  But HR remained elevated to 120s.   Pain:  no c/o pain in  session   Therapy/Group: Individual Therapy  Roney Mans [facility] 01/15/2020, 2:43 PM

## 2020-01-15 NOTE — Progress Notes (Signed)
Orthopedic Tech Progress Note Patient Details:  MC BLOODWORTH [birth year] [id number] Called in order to [facility] for BLE AFO consult Patient ID: GUERRY COVINGTON, male   DOB: 1980-09-19, 39 y.o.   MRN: [id number]   Donald Pore 01/15/2020, 8:23 AM

## 2020-01-15 NOTE — IPOC Note (Addendum)
Overall Plan of Care [facility]) Patient Details Name: Clifford Nichols MRN: [id number] DOB: [birth year]  Admitting Diagnosis: Critical illness polyneuropathy   Hospital Problems: Active Problems:   Critical illness polyneuropathy ([place])   Debility     Functional Problem List: Nursing Edema, Endurance, Pain, Skin Integrity  PT Balance, Perception, Behavior, Safety, Edema, Sensory, Endurance, Skin Integrity, Motor, Nutrition, Pain  OT Balance, Endurance  SLP Other (comment) (voice)  TR         Basic ADL's: OT Grooming, Bathing, Dressing, Toileting     Advanced  ADL's: OT       Transfers: PT Bed Mobility, Bed to Chair, Furniture, Teacher, early years/pre, Metallurgist: PT Ambulation, Emergency planning/management officer, Stairs     Additional Impairments: OT None  SLP Communication expression    TR      Anticipated Outcomes Item Anticipated Outcome  Self Feeding independent  Swallowing      Basic self-care  supervision  Toileting  supervision   Bathroom Transfers supervision  Bowel/Bladder  Pt will remain continent for remainder of stay  Transfers  supervision  Locomotion  supervision with LRAD  Communication  Mod I  Cognition     Pain  Pain level less than 5 for remainder of stay  Safety/Judgment  Pt will remain free of falls for remainder of stay   Therapy Plan: PT Intensity: Minimum of 1-2 x/day ,45 to 90 minutes PT Frequency: 5 out of 7 days PT Duration Estimated Length of Stay: ~ 2 weeks OT Intensity: Minimum of 1-2 x/day, 45 to 90 minutes OT Frequency: 5 out of 7 days OT Duration/Estimated Length of Stay: 10-12 days SLP Intensity: Minumum of 1-2 x/day, 30 to 90 minutes SLP Frequency: 3 to 5 out of 7 days SLP Duration/Estimated Length of Stay: 3-5 days for ST only - longer for OT/PT LOS   Due to the current state of emergency, patients may not be receiving their 3-hours of Medicare-mandated therapy.   Team Interventions: Nursing Interventions  Patient/Family Education, Skin Care/Wound Management, Discharge Planning, Pain Management  PT interventions Ambulation/gait training, Community reintegration, DME/adaptive equipment instruction, Neuromuscular re-education, Psychosocial support, Stair training, UE/LE Strength taining/ROM, Wheelchair propulsion/positioning, Training and development officer, Discharge planning, Pain management, Functional electrical stimulation, Skin care/wound management, Therapeutic Activities, UE/LE Coordination activities, Cognitive remediation/compensation, Disease management/prevention, Functional mobility training, Patient/family education, Splinting/orthotics, Therapeutic Exercise, Visual/perceptual remediation/compensation  OT Interventions Balance/vestibular training, Discharge planning, Pain management, Self Care/advanced ADL retraining, Therapeutic Activities, UE/LE Coordination activities, Disease mangement/prevention, Functional mobility training, Patient/family education, Therapeutic Exercise, DME/adaptive equipment instruction, Community reintegration, Neuromuscular re-education, UE/LE Strength taining/ROM  SLP Interventions Patient/family education  TR Interventions    SW/CM Interventions Discharge Planning, Psychosocial Support, Patient/Family Education   Barriers to Discharge MD  Medical stability, Home enviroment access/loayout, Therapist, nutritional and [employer]  Nursing Home environment access/layout, Therapist, nutritional, [place] home environment, Decreased caregiver support, Home environment access/layout, Trach, Medical stability, New oxygen, Weight, Wound Care 20 STE home  OT Inaccessible home environment Needs to negotiate 20 steps consecutively to get up to his apartment  [place] patient lives on 10th floor   Team Discharge Planning: Destination: PT-Home ,OT- Home , SLP-Home Projected Follow-up: PT-Home health PT, 24 hour supervision/assistance, OT-  Home health OT, 24 hour  supervision/assistance, SLP-None Projected Equipment Needs: PT-To be determined, Rolling walker with 5" wheels, OT- To be determined, SLP-Other (comment) (PMSV (pt already has one at bedside)) Equipment Details: PT- , OT-  Patient/family involved in discharge planning: PT- Patient,  OT-Patient, SLP-Patient  MD ELOS: 14d Medical Rehab Prognosis:  Excellent Assessment:  39 year old male with history of HTN--no meds X couple of months, untreated OSA, morbid obesity who was admitted on05/10/21 with acute respiratory failure and ARDS from Covid 19 PNA. He required intubation and was treated with Tocilizumab, Remdesivir, Decadron as well as broad spectrum antibiotics. He was not felt to be a candidate for ECMO due to markedly elevated BMI and treated with proning as well as Ketamine. Septic shock with BC positive for strep hominis and staph epi as well as HCAP--BAL 5/28 positive for proteus, pseudomonas, acinetobacter and enterobacter . He was treated with multiple antibiotics since admission ---last Zosyn/Vanc. ID consulted for input on ongoing fevers and felt that +BC likely due to contaminant and Vancomycin d/c by 6/01. He has had intermittent left proptosis noted and CT head negative-->has been a chronic problem per wife. To follow up with opthalmology post discharge. He developed anemia with thrombocytopenia and was transfused with 2 unita PRBC for drop in H/H to 6.4/24.5  He required tracheostomy 05/28 and extubated to ATC. He was downsized to cuffless but changed back to cuffed #6 XLT on 06/11 due need for PPS vent at nights. Respiratory status stable and he has been weaned off BIPAP--to sleep with HOB elevated per Pulmonary. He is tolerating PMSV during the day with 5L oxygen per ATC and has been refusing CPAP. 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 Pain bilateral feet felt to be due to plantar fascitis and on oxycodone every 4 hours for pain control? Has been advanced to regular with  thin liquids and tolerating this without difficulty.   Now requiring 24/7 Rehab RN,MD, as well as CIR level PT, OT and SLP.  Treatment team will focus on ADLs and mobility with goals set at Sup, has 20 steps to enter home  See Team Conference Notes for weekly updates to the plan of care

## 2020-01-15 NOTE — Plan of Care (Signed)
  Problem: RH SAFETY Goal: RH STG ADHERE TO SAFETY PRECAUTIONS W/ASSISTANCE/DEVICE Description: STG Adhere to Safety Precautions With Mod I  Assistance/Device. Outcome: Progressing   Problem: RH PAIN MANAGEMENT Goal: RH STG PAIN MANAGED AT OR BELOW PT'S PAIN GOAL Description: Pain goal less than 5 Outcome: Progressing

## 2020-01-15 NOTE — Progress Notes (Signed)
Physical Therapy Session Note  Patient Details  Name: Clifford Nichols MRN: [id number] Date of Birth: 01-12-1981  Today's Date: 01/15/2020 PT Individual Time: 1420-1515 PT Individual Time Calculation (min): 55 min   Short Term Goals: Week 1:  PT Short Term Goal 1 (Week 1): Pt will perform sit<>stand and stand pivot transfers using LRAD with CGA PT Short Term Goal 2 (Week 1): Pt will ambulate at least 12ft using LRAD with CGA PT Short Term Goal 3 (Week 1): Pt will ascend/descend 8 steps using B HRs with mod assist  Skilled Therapeutic Interventions/Progress Updates:   Pt received supine in bed and agreeable to PT. Supine>sit transfer without assist. PT assisted pt to don socks with total A for time management; pt states shoes are too tight due to mild edema. Stand pivot transfer to [facility] wth min assist. Pt transferred onto portable O2.    Pt transported to rehab gym. Gait training with min assist for safety and +2 for O2 tank management x 180ft. Pt noted to have increased step height BLE due to foot drop. Pt encouraged pt to have family bring larger shoes prior to Orthotic consult on 6/24 if possible.   Nustep 1 min, pt noted to have BLE falling off peddles, PT utilized theraband to prevent feet from falling. Additional 4 min on level 5, SpO2 measured at 100% throughout. Stand pivot transfer to and from nustep with min assist and RW; cues for posture and safety.   Pt performed reciprocal marches in parallel bars x 8 BLE cue for posture and safety when returning starting position. Mild knee instability once, but no LOB.       Therapy Documentation Precautions:  Precautions Precautions: Fall Precaution Comments: monitor O2 and HR Restrictions Weight Bearing Restrictions: No   Pain:   8/10 Bil feet. Pt repositioned.    Therapy/Group: Individual Therapy  Golden Pop 01/15/2020, 5:43 PM

## 2020-01-15 NOTE — Progress Notes (Signed)
Patient ID: Clifford Nichols, male   DOB: [birth year], 39 y.o.   MRN: [id number]   Team Conference Report to Patient/Family  Team Conference discussion was reviewed with the patient and caregiver, including goals, any changes in plan of care and target discharge date.  Patient and caregiver express understanding and are in agreement.  The patient has a target discharge date of 01/28/20.  Andria Rhein 01/15/2020, 1:40 PM

## 2020-01-15 NOTE — Progress Notes (Signed)
[facility] PHYSICAL MEDICINE & REHABILITATION PROGRESS NOTE   Subjective/Complaints:  Having problems donning socks due to limited LE ROM and Strength.  Discussed foot drop with pt   ROS - CP, SOB, N/V/D  Objective:   No results found. Recent Labs    01/13/20 0216 01/14/20 0659  WBC 7.8 6.9  HGB 9.6* 10.1*  HCT 32.0* 34.2*  PLT 197 202   Recent Labs    01/13/20 0216 01/14/20 0659  NA 139 141  K 3.5 3.9  CL 103 106  CO2 25 26  GLUCOSE 137* 127*  BUN 17 10  CREATININE 1.09 1.03  CALCIUM 8.7* 9.1    Intake/Output Summary (Last 24 hours) at 01/15/2020 0804 Last data filed at 01/15/2020 0700 Gross per 24 hour  Intake 500 ml  Output 1450 ml  Net -950 ml     Physical Exam: Vital Signs Blood pressure (!) 131/91, pulse 99, temperature 98.4 F (36.9 C), temperature source Oral, resp. rate 20, weight (!) 151 kg, SpO2 98 %.  General: No acute distress Mood and affect are appropriate Heart: Regular rate and rhythm no rubs murmurs or extra sounds Lungs: Clear to auscultation, breathing unlabored, no rales or wheezes Abdomen: Positive bowel sounds, soft nontender to palpation, nondistended Extremities: No clubbing, cyanosis, or edema Skin: No evidence of breakdown, no evidence of rash Neurologic: Cranial nerves II through XII intact, motor strength is 5/5 in bilateral deltoid, bicep, tricep, grip, hip flexor, knee extensors, ankle dorsiflexor and plantar flexor Sensory examabsent sensation to light touch and proprioception right foot , absent LT left foot but intact proprio Musculoskeletal: Full range of motion in all 4 extremities. No joint swelling     Assessment/Plan: 1. Functional deficits secondary to Critical illness polyneuropathy  which require 3+ hours per day of interdisciplinary therapy in a comprehensive inpatient rehab setting.  Physiatrist is providing close team supervision and 24 hour management of active medical problems listed below.  Physiatrist  and rehab team continue to assess barriers to discharge/monitor patient progress toward functional and medical goals  Care Tool:  Bathing    Body parts bathed by patient: Right arm, Left arm, Chest, Abdomen, Front perineal area, Right upper leg, Left upper leg, Face   Body parts bathed by helper: Right lower leg, Left lower leg, Buttocks     Bathing assist Assist Level: Minimal Assistance - Patient > 75%     Upper Body Dressing/Undressing Upper body dressing   What is the patient wearing?: Pull over shirt    Upper body assist Assist Level: Set up assist    Lower Body Dressing/Undressing Lower body dressing      What is the patient wearing?: Pants     Lower body assist Assist for lower body dressing: Maximal Assistance - Patient 25 - 49%     Toileting Toileting    Toileting assist Assist for toileting: Independent with assistive device Assistive Device Comment: urinal   Transfers Chair/bed transfer  Transfers assist     Chair/bed transfer assist level: Minimal Assistance - Patient > 75%     Locomotion Ambulation   Ambulation assist   Ambulation activity did not occur: Safety/medical concerns (required use of RW (does not use AD baseline))  Assist level:  (146ft w/ min A of 1 using RW and +2 w/c follow & O2 line management) Assistive device: Walker-rolling Max distance: 4'   Walk 10 feet activity   Assist  Walk 10 feet activity did not occur: Safety/medical concerns  Walk 50 feet activity   Assist Walk 50 feet with 2 turns activity did not occur: Safety/medical concerns         Walk 150 feet activity   Assist Walk 150 feet activity did not occur: Safety/medical concerns         Walk 10 feet on uneven surface  activity   Assist Walk 10 feet on uneven surfaces activity did not occur: Safety/medical concerns         Wheelchair     Assist Will patient use wheelchair at discharge?:  (TBD but do not anticipate)              Wheelchair 50 feet with 2 turns activity    Assist            Wheelchair 150 feet activity     Assist          Blood pressure (!) 131/91, pulse 99, temperature 98.4 F (36.9 C), temperature source Oral, resp. rate 20, weight (!) 151 kg, SpO2 98 %.  Medical Problem List and Plan: 1.   Reduced mobility and self-care skills secondary to critical illness polyneuropathy -patient may not shower -ELOS/Goals: 7 to 10 days 2. Antithrombotics: -DVT/anticoagulation:Pharmaceutical:Lovenoxbid -antiplatelet therapy: N/A 3. Pain Management:ON oxycodone 10 mg every 4 hours--denies any issues with pain. Attempt to start wean over next few days. Continue gabapentin bid for neuropathy. 4. Mood:LCSW to follow for evaluation and support. -antipsychotic agents: N/A 5. Neuropsych: This patientiscapable of making decisions onhisown behalf. 6.Sacral decub/Skin/Wound Care:Hydrotherapy to sacral wound 5 X week with santyl wet to dry dressing changes. Continue Zinc and vitamin C. Will add protein supplement to promote healing. 7. Fluids/Electrolytes/Nutrition:Monitor I/O. Check lytes in am.  8. HTN: Monitor BP tid--controlled without meds. Vitals:   01/15/20 0438 01/15/20 0520  BP: (!) 131/91   Pulse: (!) 103 99  Resp: 20 20  Temp: 98.4 F (36.9 C)   SpO2: 98% 98%  mild elevation will monitor for now  9. Covid PNA with hypoxemic respiratory failure:Keep HOB >35 degrees at nights.On 5 L oxygen per ATC. 10. New diagnosis T2DM: Hgb A1C-6.6. Will transition from levemir to oral hypoglycemic regimen. Change diet to CM. Continue to monitor BS ac/hs and titrate medication as indicated.  CBG (last 3)  Recent Labs    01/14/20 1623 01/14/20 2050 01/15/20 0620  GLUCAP 96 100* 111*  d/c Levimir, start metformin 500mg  qam  11. Anemia of critical illness: Is improving from drop to 7.7-->9.6 12. Morbid obesity:  Educated on weight loss and healthy diet. 13. OSA: Has had study years ago but could not afford CPAP per mother. Will need sleep study after discharge. 14. Resting Tachycardia: Likely due to debility--HR in 110-120.If BP is up can add BB    LOS: 2 days A FACE TO FACE EVALUATION WAS PERFORMED  [name] 01/15/2020, 8:04 AM

## 2020-01-15 NOTE — Patient Care Conference (Signed)
Inpatient RehabilitationTeam Conference and Plan of Care Update Date: 01/15/2020   Time: 2:31 PM    Patient Name: Clifford Nichols      Medical Record Number: [id number]  Date of Birth: [birth year] Sex: Male         Room/Bed: 4M12C/4M12C-01 Payor Info: Payor: BLUE CROSS BLUE [employer]IELD / Plan: BCBS[employer] NON-PARTICIPATING / Product Type: *No Product type* /    Admit Date/Time:  01/13/2020  4:09 PM  Primary Diagnosis:  <principal problem not specified>  Patient Active Problem List   Diagnosis Date Noted  . Critical illness polyneuropathy (HCC) 01/13/2020  . Debility 01/13/2020  . Status post tracheostomy (HCC)   . Pressure injury of skin 12/27/2019  . Acute on chronic respiratory failure with hypoxia and hypercapnia (HCC)   . Acute respiratory failure (HCC)   . ARDS (adult respiratory distress syndrome) (HCC) 12/04/2019  . COVID-19 12/03/2019  . Acute respiratory failure due to COVID-19 (HCC) 12/02/2019  . Hyperglycemia 12/02/2019  . Diabetes (HCC) 12/02/2019  . HTN (hypertension) 04/27/2018    Expected Discharge Date: Expected Discharge Date: 01/28/20  Team Members Present: Physician leading conference: Dr. Claudette Laws Care Coodinator Present: Chana Bode, RN, BSN, CRRN;Christina Vita Barley, BSW Nurse Present: Other (comment) Karoline Caldwell) PT Present: Peter Congo, PT OT Present: Perrin Maltese, OT SLP Present: Suzzette Righter, CF-SLP PPS Coordinator present : Fae Pippin, SLP     Current Status/Progress Goal Weekly Team Focus  Bowel/Bladder   Pt is continent of b/b, LBM 01/14/20  Pt will remain contient of b/b  Q2h toileting/PRN   Swallow/Nutrition/ Hydration             ADL's   supervision for UB selfcare with mod assist for LB bathing and max assist for LB dressing.  Min assist for transfers as well as sit to stand.  supervision overall  selfcare retraining, transfer training, balance retraining, therapeutic exercise, DME/AE education, pt/family education   Mobility    supervision bed mobility, min assist transfers using RW, gait 186ft using RW with min assist and +2 for w/c follow and O2 line management, ascended/descended 2 steps (6" height) using B HRs with mod assist of 1 and +2 for LE management and O2 line management  supervision/mod-I at ambulatory level  activity tolerance, bed mobility, transfer training, gait training, stair training, pt education   Communication   tolerating PMSV all waking hrs, 100% intelligible, mild air trapping after extended use but no s/sx respiratory distress  Mod I  education about home use of PMSV (donning/doffing, cleaning, etc.), will not require ST for full length of stay   Safety/Cognition/ Behavioral Observations            Pain   Pt c/o 10/10 bilateral feet pain. Pt states the pain is contanct. stabbing/throbbing pain.  pts pain will be less than 4  Assess pain Qshift.   Skin   Pt has stage 3 to the sacrum, w/ABD pad, santyl and gauze  Pt will be free from further breakdown/infection  Assess skin qshift, dressing changes as ordered.    Rehab Goals Patient on target to meet rehab goals: Yes Rehab Goals Revised: Patient goals being evaluated *See Care Plan and progress notes for long and short-term goals.     Barriers to Discharge  Current Status/Progress Possible Resolutions Date Resolved   Nursing  Home environment access/layout;Trach;Wound Care               PT  Inaccessible home environment;Decreased caregiver support;Home environment access/layout;Trach;Medical stability;New oxygen;Weight;Wound Care  4 STE home              OT Inaccessible home environment  Needs to negotiate 20 steps consecutively to get up to his apartment             [place] patient lives on 10th floor            Discharge Planning/Teaching Needs:  Patient plans to discharge home with spouse and children (2 Teens)  Will schuedle with family if recommended   Team Discussion:  Plan for discharge  home with trach and humidified air. Follow up with trach clinic as an outpatient. Requires AFO however current shoes on hand are too small; need new shoes. Supervision goals set and currently mod assist for LB self care with adaptive equipment and AFO. Ambulated with min assist. Sacral wound care can be managed by significant other at discharge.  Revisions to Treatment Plan:  Work on steps with left railing for accessibility to home at discharge (20 step up to entrance)    Medical Summary Current Status: Still requiring tracheostomy, will go home on this, tolerating therapy from cardiorespiratory standpoint, severe bilateral foot drop Weekly Focus/Goal: Orthotic evaluation  Barriers to Discharge: Trach;Weight   Possible Resolutions to Barriers: See above   Continued Need for Acute Rehabilitation Level of Care: The patient requires daily medical management by a physician with specialized training in physical medicine and rehabilitation for the following reasons: Direction of a multidisciplinary physical rehabilitation program to maximize functional independence : Yes Medical management of patient stability for increased activity during participation in an intensive rehabilitation regime.: Yes Analysis of laboratory values and/or radiology reports with any subsequent need for medication adjustment and/or medical intervention. : Yes   I attest that I was present, lead the team conference, and concur with the assessment and plan of the team.   Dorien Chihuahua B 01/15/2020, 2:31 PM

## 2020-01-15 NOTE — Progress Notes (Signed)
Patient ID: Clifford Nichols, male   DOB: 15-Dec-1980, 39 y.o.   MRN: [id number]   Medicaid Application provided to patient

## 2020-01-15 NOTE — Progress Notes (Signed)
Physical Therapy Wound Treatment Patient Details  Name: Clifford Nichols MRN: [id number] Date of Birth: [birth year]  Today's Date: 01/15/2020 PT Individual Time: 9924-2683 PT Individual Time Calculation (min): 25 min   Subjective  Subjective: pt in room on belly on entry and can independently roll onto his R side for hydrotherapy  Patient and Family Stated Goals: get back to his family Prior Treatments: dressing change, santyl  Pain Score:  3/10  Wound Assessment  Pressure Injury 12/26/19 Sacrum Mid Stage 3 -  Full thickness tissue loss. Subcutaneous fat may be visible but bone, tendon or muscle are NOT exposed. Initial wound with Eschar=unstageable; eschar was removed and hydrotherapy/santyl initiated; now consid (Active)  Wound Image   01/15/20 1300  Dressing Type ABD;Gauze (Comment);Barrier Film (skin prep);Moist to moist 01/15/20 1300  Dressing Changed;Clean;Dry;Intact 01/15/20 1300  Dressing Change Frequency Daily 01/14/20 1300  State of Healing Early/partial granulation 01/14/20 1300  Site / Wound Assessment Pink;Yellow;Granulation tissue 01/14/20 1300  % Wound base Red or Granulating 80% 01/15/20 1300  % Wound base Yellow/Fibrinous Exudate 20% 01/15/20 1300  % Wound base Black/Eschar 0% 01/15/20 1300  % Wound base Other/Granulation Tissue (Comment) 0% 01/14/20 1300  Peri-wound Assessment Intact 01/14/20 1300  Wound Length (cm) 6.7 cm 01/15/20 1300  Wound Width (cm) 3.6 cm 01/15/20 1300  Wound Depth (cm) 1.8 cm 01/15/20 1300  Wound Surface Area (cm^2) 24.12 cm^2 01/15/20 1300  Wound Volume (cm^3) 43.42 cm^3 01/15/20 1300  Margins Unattached edges (unapproximated) 01/15/20 1300  Drainage Amount Minimal 01/15/20 1300  Drainage Description Serosanguineous 01/15/20 1300  Treatment Hydrotherapy (Pulse lavage);Packing (Saline gauze) 01/15/20 1300  Santyl applied to small amount of tightly adhered slough material  Hydrotherapy Pulsed lavage therapy - wound location: sacrum Pulsed  Lavage with Suction (psi): 12 psi Pulsed Lavage with Suction - Normal Saline Used: 1000 mL Pulsed Lavage Tip: Tip with splash shield Selective Debridement Selective Debridement - Location: sacrum   Wound Assessment and Plan  Wound Therapy - Assess/Plan/Recommendations Wound Therapy - Clinical Statement: Pt wound has turned the corner and is currently 80% granulation tissue, small amont of tightly adhered yellow slough remains. Consulted with [place] and in agreement to discharge from hydrotherapy and switch to regular dressing changes Factors Delaying/Impairing Wound Healing: Multiple medical problems Hydrotherapy Plan: Debridement;Dressing change;Patient/family education;Pulsatile lavage with suction Wound Therapy - Frequency: 6X / week Wound Therapy - Follow Up Recommendations: [place] Wound Plan: see above  Wound Therapy Goals- Improve the function of patient's integumentary system by progressing the wound(s) through the phases of wound healing (inflammation - proliferation - remodeling) by: Decrease Necrotic Tissue to: 25 Decrease Necrotic Tissue - Progress: Met Increase Granulation Tissue to: 75 Increase Granulation Tissue - Progress: Met Goals/treatment plan/discharge plan were made with and agreed upon by patient/family: Yes Time For Goal Achievement: 7 days Wound Therapy - Potential for Goals: Excellent  Goals will be updated until maximal potential achieved or discharge criteria met.  Discharge criteria: when goals achieved, discharge from hospital, MD decision/surgical intervention, no progress towards goals, refusal/missing three consecutive treatments without notification or medical reason.  GP    Dani Gobble. Migdalia Dk PT, DPT Acute Rehabilitation Services Pager [phone] Office [phone]  [place] 01/15/2020, 2:06 PM

## 2020-01-16 ENCOUNTER — Inpatient Hospital Stay (HOSPITAL_COMMUNITY): Payer: BLUE CROSS/BLUE SHIELD | Admitting: Physical Therapy

## 2020-01-16 ENCOUNTER — Inpatient Hospital Stay (HOSPITAL_COMMUNITY): Payer: BLUE CROSS/BLUE SHIELD | Admitting: Occupational Therapy

## 2020-01-16 ENCOUNTER — Inpatient Hospital Stay (HOSPITAL_COMMUNITY): Payer: BLUE CROSS/BLUE SHIELD | Admitting: *Deleted

## 2020-01-16 ENCOUNTER — Inpatient Hospital Stay (HOSPITAL_COMMUNITY): Payer: BLUE CROSS/BLUE SHIELD | Admitting: Speech Pathology

## 2020-01-16 LAB — GLUCOSE, CAPILLARY
Glucose-Capillary: 111 mg/dL — ABNORMAL HIGH (ref 70–99)
Glucose-Capillary: 112 mg/dL — ABNORMAL HIGH (ref 70–99)
Glucose-Capillary: 76 mg/dL (ref 70–99)
Glucose-Capillary: 96 mg/dL (ref 70–99)

## 2020-01-16 MED ORDER — ENOXAPARIN SODIUM 80 MG/0.8ML ~~LOC~~ SOLN
80.0000 mg | SUBCUTANEOUS | Status: DC
  Administered 2020-01-16 – 2020-01-22 (×7): 80 mg via SUBCUTANEOUS
  Filled 2020-01-16 (×8): qty 0.8

## 2020-01-16 NOTE — Progress Notes (Signed)
Speech Language Pathology Discharge Summary  Patient Details  Name: Clifford Nichols MRN: [id number] Date of Birth: 10/24/1980  Patient has met 2 of 2 long term goals.  Patient to discharge at overall Modified Independent level.  Reasons goals not met: n/a   Clinical Impression/Discharge Summary:   Pt made quick functional gains and met 2 out of 2 long term goals this admission. Pt is currently tolerating PMSV during all waking hours and is Mod I for donning/doffing it, as well as performing care tasks such as cleaning, appropriate storage, etc. Education has been completed with pt and his mother. No further ST is indicated at this time, and pt will not require follow up ST as outpatient unless speech/swallow changes are noted by pt.    Care Partner:  Caregiver Able to Provide Assistance: Yes  Type of Caregiver Assistance: Cognitive;Physical  Recommendation:  None  Rationale for SLP Follow Up: Other (comment) (n/a)   Equipment: PMSV (pt already has 2)   Reasons for discharge: Treatment goals met   Patient/Family Agrees with Progress Made and Goals Achieved: Yes    Arbutus Leas 01/16/2020, 10:56 AM

## 2020-01-16 NOTE — Progress Notes (Signed)
Occupational Therapy Session Note  Patient Details  Name: Clifford Nichols MRN: [id number] Date of Birth: 08-Nov-1980  Today's Date: 01/16/2020 OT Individual Time: 6301-6010 OT Individual Time Calculation (min): 95 min    Short Term Goals: Week 1:  OT Short Term Goal 1 (Week 1): Pt will complete LB bathing sit to stand with min assist using AE PRN. OT Short Term Goal 2 (Week 1): Pt will complete LB dressing sit to stand with supervision using AE PRN. OT Short Term Goal 3 (Week 1): Pt will maintain standing at the sink during grooming tasks for at least 3 mins with close supervision in order to increase overall endurance. OT Short Term Goal 4 (Week 1): Pt will complete toilet transfers with use of the RW and close supervision to elevated toilet.  Skilled Therapeutic Interventions/Progress Updates:    Pt worked on bathing and dressing sit to stand from the EOB to start session.  He was able to complete transfer from supine to sitting EOB with supervision.  He then completed all UB bathing and dressing with setup.  LB bathing was completed with min assist and use of AE for washing and drying the lower legs and feet.  He was able to complete sit to stand with min guard for washing peri area and pulling up pants, but needed mod assist for washing secondary to limited shoulder AROM and min assist for pulling pants over his hips.  He was able to use the sockaide with supervision for donning his gripper socks.  Next, he completed oral hygiene with setup and then completed functional mobility to the ortho gym with min assist for work on BUE strengthening, with use of the UE ergonometer.  He was able to complete 2 sets of 7.5 mins each with resistance on level 10.  RPMs maintained at around 30 for each set with the first being completed peddling forward and the second peddling backwards.  Oxygen sats at 93-95% on room air with HR elevated to 134.  Finished session with return to the room via wheelchair with pt  left up in the chair to work on breakfast.  Call button and phone in reach.     Therapy Documentation Precautions:  Precautions Precautions: Fall Precaution Comments: monitor O2 and HR Restrictions Weight Bearing Restrictions: No  Pain: Pain Assessment Pain Scale: Faces Pain Score: 0-No pain ADL:  See Care Tool Section for some details of mobility for selfcare   Therapy/Group: Individual Therapy  Hala Narula  OTR/L 01/16/2020, 9:51 AM

## 2020-01-16 NOTE — Progress Notes (Signed)
Speech Language Pathology Daily Session Note  Patient Details  Name: Clifford Nichols MRN: [id number] Date of Birth: 1981/07/11  Today's Date: 01/16/2020 SLP Individual Time: 1016-1055 SLP Individual Time Calculation (min): 39 min  Short Term Goals: Week 1: SLP Short Term Goal 1 (Week 1): STG=LTG due to ELOS for ST  Skilled Therapeutic Interventions: Pt was seen for skilled ST targeting education regarding home use and care of pt's [place] Speaking Valve, given that pt with d/c home with trach. Pt's mother also present for session and engaged in education. Pt's PMSV was donned throughout session, vitals remaining stable throughout session. Pt orally expectorated secretion X1 independently. Teach back method used to educate about cleaning, storage, and donning and doffing in different situations. Pt verbalized and returned demonstrations Mod I. Answered questions about air trapping, how to self-monitor WOB at home and when to [facility] PMSV, as well as how to order additional valves if lost or damaged. All questions answered to pt and mother's satisfaction. Education is complete and goals met for PMSV care/home use at this time. Therefore, no further ST is indicated. Family aware they can reconsult ST as needed if questions or concerns arise.      Pain Pain Assessment Pain Scale: Faces Pain Score: 0-No pain Faces Pain Scale: No hurt  Therapy/Group: Individual Therapy  Arbutus Leas 01/16/2020, 6:58 AM

## 2020-01-16 NOTE — Progress Notes (Signed)
[facility] PHYSICAL MEDICINE & REHABILITATION PROGRESS NOTE   Subjective/Complaints:  No issues overnite except bilateral foot pain, pain is constant involves entire foot, worse at noc , no trauma, still has poor sensation in B feet   ROS - CP, SOB, N/V/D  Objective:   No results found. Recent Labs    01/14/20 0659  WBC 6.9  HGB 10.1*  HCT 34.2*  PLT 202   Recent Labs    01/14/20 0659  NA 141  K 3.9  CL 106  CO2 26  GLUCOSE 127*  BUN 10  CREATININE 1.03  CALCIUM 9.1    Intake/Output Summary (Last 24 hours) at 01/16/2020 9323 Last data filed at 01/15/2020 2336 Gross per 24 hour  Intake 630 ml  Output 150 ml  Net 480 ml     Physical Exam: Vital Signs Blood pressure 121/69, pulse 97, temperature 98.1 F (36.7 C), resp. rate 18, weight (!) 151 kg, SpO2 (!) 87 %.   General: No acute distress Mood and affect are appropriate Heart: Regular rate and rhythm no rubs murmurs or extra sounds Lungs: Clear to auscultation, breathing unlabored, no rales or wheezes Abdomen: Positive bowel sounds, soft nontender to palpation, nondistended Extremities: No clubbing, cyanosis, or edema Skin: No evidence of breakdown, no evidence of rash  Neurologic: Cranial nerves II through XII intact, motor strength is 5/5 in bilateral deltoid, bicep, tricep, grip, hip flexor, knee extensors,0/5  ankle dorsiflexor and plantar flexor Sensory examabsent sensation to light touch and proprioception right foot , absent LT left foot reduced proprio  Musculoskeletal: Full range of motion in all 4 extremities. No joint swelling     Assessment/Plan: 1. Functional deficits secondary to Critical illness polyneuropathy  which require 3+ hours per day of interdisciplinary therapy in a comprehensive inpatient rehab setting.  Physiatrist is providing close team supervision and 24 hour management of active medical problems listed below.  Physiatrist and rehab team continue to assess barriers to  discharge/monitor patient progress toward functional and medical goals  Care Tool:  Bathing    Body parts bathed by patient: Right arm, Left arm, Chest, Abdomen, Front perineal area, Right upper leg, Left upper leg, Face   Body parts bathed by helper: Right lower leg, Left lower leg, Buttocks     Bathing assist Assist Level: Minimal Assistance - Patient > 75%     Upper Body Dressing/Undressing Upper body dressing   What is the patient wearing?: Pull over shirt    Upper body assist Assist Level: Supervision/Verbal cueing    Lower Body Dressing/Undressing Lower body dressing      What is the patient wearing?: Pants     Lower body assist Assist for lower body dressing: Maximal Assistance - Patient 25 - 49%     Toileting Toileting    Toileting assist Assist for toileting: Independent with assistive device Assistive Device Comment: urinal   Transfers Chair/bed transfer  Transfers assist     Chair/bed transfer assist level: Minimal Assistance - Patient > 75% Chair/bed transfer assistive device: Programmer, multimedia   Ambulation assist   Ambulation activity did not occur: Safety/medical concerns (required use of RW (does not use AD baseline))  Assist level:  (130ft w/ min A of 1 using RW and +2 w/c follow & O2 line management) Assistive device: Walker-rolling Max distance: 4'   Walk 10 feet activity   Assist  Walk 10 feet activity did not occur: Safety/medical concerns        Walk 50  feet activity   Assist Walk 50 feet with 2 turns activity did not occur: Safety/medical concerns         Walk 150 feet activity   Assist Walk 150 feet activity did not occur: Safety/medical concerns         Walk 10 feet on uneven surface  activity   Assist Walk 10 feet on uneven surfaces activity did not occur: Safety/medical concerns         Wheelchair     Assist Will patient use wheelchair at discharge?:  (TBD but do not anticipate)              Wheelchair 50 feet with 2 turns activity    Assist            Wheelchair 150 feet activity     Assist          Blood pressure 121/69, pulse 97, temperature 98.1 F (36.7 C), resp. rate 18, weight (!) 151 kg, SpO2 (!) 87 %.  Medical Problem List and Plan: 1.   Reduced mobility and self-care skills secondary to critical illness polyneuropathy -patient may not shower -ELOS/Goals: 7 to 10 days 2. Antithrombotics: -DVT/anticoagulation:Pharmaceutical:Lovenox80mg  QD per Pharm -antiplatelet therapy: N/A 3. Pain Management:ON oxycodone 10 mg every 4 hours--denies any issues with pain. Attempt to start wean over next few days. Increased  gabapentin TID  for neuropathy.if no better in am consider increase to 600mg   4. Mood:LCSW to follow for evaluation and support. -antipsychotic agents: N/A 5. Neuropsych: This patientiscapable of making decisions onhisown behalf. 6.Sacral decub/Skin/Wound Care:Hydrotherapy to sacral wound 5 X week with santyl wet to dry dressing changes. Continue Zinc and vitamin C. Will add protein supplement to promote healing. 7. Fluids/Electrolytes/Nutrition:Monitor I/O. Check lytes in am.  8. HTN: Monitor BP tid--controlled without meds. Vitals:   01/16/20 0332 01/16/20 0619  BP:  121/69  Pulse: 96 97  Resp: 18 18  Temp:  98.1 F (36.7 C)  SpO2: 96% (!) 87%  controlled 6/24 9. Covid PNA with hypoxemic respiratory failure:Keep HOB >35 degrees at nights.On 5 L oxygen per ATC. 10. New diagnosis T2DM: Hgb A1C-6.6. Will transition from levemir to oral hypoglycemic regimen. Change diet to CM. Continue to monitor BS ac/hs and titrate medication as indicated.  CBG (last 3)  Recent Labs    01/15/20 1651 01/15/20 2051 01/16/20 0615  GLUCAP 81 119* 111*  d/c Levimir, start metformin 500mg  qam  11. Anemia of critical illness: Is improving from drop to 7.7-->9.6 12.  Morbid obesity: Educated on weight loss and healthy diet. 13. OSA: Has had study years ago but could not afford CPAP per mother. Will need sleep study after discharge. 14. Resting Tachycardia: Likely due to debility--HR in 110-120.If BP is up can add BB    LOS: 3 days A FACE TO FACE EVALUATION WAS PERFORMED  01/18/20 01/16/2020, 7:02 AM

## 2020-01-16 NOTE — Plan of Care (Signed)
  Problem: RH SAFETY Goal: RH STG ADHERE TO SAFETY PRECAUTIONS W/ASSISTANCE/DEVICE Description STG Adhere to Safety Precautions With Mod I Assistance/Device.  Outcome: Progressing   

## 2020-01-16 NOTE — Evaluation (Signed)
Recreational Therapy Assessment and Plan  Patient Details  Name: Clifford Nichols MRN: [id number] Date of Birth: Feb 04, 1981 Today's Date: 01/16/2020  Rehab Potential:  Good ELOS:   d/c 7/6  Assessment   Problem List:      Patient Active Problem List   Diagnosis Date Noted  . Critical illness polyneuropathy ([place]) 01/13/2020  . Debility 01/13/2020  . Status post tracheostomy ([place])   . Pressure injury of skin 12/27/2019  . Acute on chronic respiratory failure with hypoxia and hypercapnia (HCC)   . Acute respiratory failure ([place])   . ARDS (adult respiratory distress syndrome) ([place]) 12/04/2019  . COVID-19 12/03/2019  . Acute respiratory failure due to COVID-19 ([place]) 12/02/2019  . Hyperglycemia 12/02/2019  . Diabetes ([place]) 12/02/2019  . HTN (hypertension) 04/27/2018    Past Medical History:      Past Medical History:  Diagnosis Date  . Abscess of upper back excluding scapular region   . Diabetes mellitus without complication ([place])   . GERD (gastroesophageal reflux disease)    OCC-NO MEDS  . Hypertension   . Sebaceous cyst    Past Surgical History:       Past Surgical History:  Procedure Laterality Date  . IRRIGATION AND DEBRIDEMENT SEBACEOUS CYST Right 07/11/2018   Procedure: EXCISION  SEBACEOUS CYST POSTERIOR SHOULDER AND MID-BACK;  Surgeon: [name], MD;  Location: [facility] ORS;  Service: General;  Laterality: Right;  . NO PAST SURGERIES      Assessment & Plan Clinical Impression: Patient is a 39 y.o. year old male with history of HTN--no meds X couple of months, untreated OSA, morbid obesity who was admitted on05/10/21 with acute respiratory failure and ARDS from Covid 19 PNA. He required intubation and was treated with Tocilizumab, Remdesivir, Decadron as well as broad spectrum antibiotics. He was not felt to be a candidate for ECMO due to markedly elevated BMI and treated with proning as well as Ketamine. Septic shock with BC positive for strep  hominis and staph epi as well as HCAP--BAL 5/28 positive for proteus, pseudomonas, acinetobacter and enterobacter . He was treated with multiple antibiotics since admission ---last Zosyn/Vanc. ID consulted for input on ongoing fevers and felt that +BC likely due to contaminant and Vancomycin d/c by 6/01. He has had intermittent left proptosis noted and CT head negative-->has been a chronic problem per wife. To follow up with opthalmology post discharge. He developed anemia with thrombocytopenia and was transfused with 2 unita PRBC for drop in H/H to 6.4/24.5  He required tracheostomy 05/28 and extubated to ATC. He was downsized to cuffless but changed back to cuffed #6 XLT on 06/11 due need for PPS vent at nights. Respiratory status stable and he has been weaned off BIPAP--to sleep with HOB elevated per Pulmonary. He is tolerating PMSV during the day with 5L oxygen per ATC and has been refusing CPAP. 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 Pain bilateral feet felt to be due to plantar fascitis and on oxycodone every 4 hours for pain control? Has been advanced to regular with thin liquids and tolerating this without difficulty. Therapy ongoing and CIR recommended due to functional deficits. Patient transferred to CIR on 01/13/2020 .   Pt presents with decreased activity tolerance, decreased functional mobility, decreased balance,  decreased oxygen support, decreased coordination Limiting pt's independence with leisure/community pursuits.  Met with pt and family today to discuss TR services, use of leisure time, activity analysis, stress and relaxation strategies.  ALso discussed holistic health with  emphasis on emotional, social, & spiritual in addition to physical and their impact on overall health & wellness.  Pt stated understanding.  Plan  Min 1 TR session >20 minutes per week during LOS  Recommendations for other services: None   Discharge Criteria: Patient will be discharged  from TR if patient refuses treatment 3 consecutive times without medical reason.  If treatment goals not met, if there is a change in medical status, if patient makes no progress towards goals or if patient is discharged from hospital.  The above assessment, treatment plan, treatment alternatives and goals were discussed and mutually agreed upon: by patient  [place] 01/16/2020, 3:38 PM

## 2020-01-16 NOTE — Progress Notes (Signed)
Trach  Inner cannula changed at [phone] by RRT. No problems to report. Pt denied the need for suctioning. Trach secured and dressing clean, dry and intact. Continuous pulse ox. applied.  Georgianne Gritz, lpn

## 2020-01-16 NOTE — Progress Notes (Signed)
NAME:  Clifford Nichols, MRN:  [id number], DOB:  03/05/1981, LOS: 3 [facility] ADMISSION DATE:  01/13/2020, [name] Admission DATE:  12/04/19 REFERRING MD:  Dr Mortimer Fries, CCM, CHIEF COMPLAINT:  Acute respiratory failure   Brief History   39 year old obese man with hypertension, diabetes admitted with acute respiratory failure, ARDS from COVID-19 pneumonia, initial positive test 5/10  Past Medical History    has a past medical history of Abscess of upper back excluding scapular region, Diabetes mellitus without complication ([place]), GERD (gastroesophageal reflux disease), Hypertension, and Sebaceous cyst.  Significant Hospital Events   5/10 admitted [facility]  5/11 failed HHFNC, BiPAP.  Required intubation 5/12 transferred to [facility], evaluated by opthal for proptosis- no intervention required 5/17 Started paralytics, ECMO team consulted Not a candidate 5/19 Started proning 5/23 Off pressors 5/26 Proning stopped, continue paralytics 5/27 New tongue wound 2/2 bite block in place, weaned off ketamine. Daily fevers 5/28 Trach, stop paralytics 6/1 Out of airborne isolation, family visiting, on PSV weans 6/6 Tolerated ATC yesterday for 12 hrs 6/7 remained on ATC 6/11 remains on ATC  Consults:  Ophthalmology for proptosis 5/12  Procedures:  ETT 5/11 >> 5/28 L femoral CVC 5/11 >> 5/17 R radial art line 5/17 >> 5/28 L IJ CVC 5/17 >> 6/1  Trach 5/28  Significant Diagnostic Tests:  Head CT 5/11 >> no significant intracranial abnormality.  Scattered opacification and air-fluid levels in the frontal, ethmoid, sphenoid and maxillary sinuses of unclear significance.  Head CT 5/12 >> no significant intracranial abnormality  Lower extremity Doppler 5/14 >> negative for DVT  Micro Data:  SARS CoV2 5/10 >> positive MRSA screen 5/12 >> negative Respiratory 5/12 >> negative Blood 5/17 >> Staph epi and Staph hominis Respiratory 5/17 >> Staph epi 5/24 resp cx bal: staph epi 5/24 BAL acid fast: smear negative cx  pending 5/24 BAL fungal:pending 5/26 blood: No growth BAL 5/28-Acinetobacter, Proteus, Pseudomonas, Enterococcus Trach asp 6/1- Pseudomonas  Antimicrobials:  Remdesivir 5/11 >> 5/15 Dexamethasone 5/11 >> 5/20 Tocilizumab 5/12 Cefepime 5/17 >> 5/21 Linezolid 5/17 >> 5/23 Zosyn 5/21 >>  5/29 Vanco 5/28 >> 6/1   Interim history/subjective:  No interval change.  Working with PT/OT/ST.  Tolerating ATC with daytime PMV. Tol PO diet.    Objective   Blood pressure 121/69, pulse 97, temperature 98.1 F (36.7 C), resp. rate 18, weight (!) 151 kg, SpO2 (!) 87 %.    FiO2 (%):  [28 %] 28 %   Intake/Output Summary (Last 24 hours) at 01/16/2020 0930 Last data filed at 01/16/2020 0732 Gross per 24 hour  Intake 885 ml  Output 150 ml  Net 735 ml   Filed Weights   01/13/20 1700 01/15/20 0623  Weight: (!) 160.1 kg (!) 151 kg    Examination: General:  Obese male, NAD working with OT HEENT: MM pink/moist, trach c/d #6 cuffed with PMV Neuro: awake, alert, appropriate, MAE  CV: s1s2 rrr, no m/r/g PULM:  resps even non labored on ATC on RA GI: soft, bsx4 active  Extremities: warm/dry, no sig edema  Skin: no rashes or lesions   Resolved Hospital Problem list    Left proptosis, noted with coughing early 5/12, resolved.  Apparently per wife he has episodic intermittent proptosis at home as well. Head CT 5/12 reassuring Ophthalmology did evaluate, recommended that he will need outpatient evaluation but no intervention at this time beyond keeping the eyes moist.  Septic shock, present on admission. Tongue necrosis secondary to biting down Hypertriglyceridemia Acinetobacter HAP Acute toxic metabolic  encephalopathy    Assessment & Plan:   Severe ARDS due to COVID-19 pneumonia  S/p trach  Morbid obesity,  OSA with pre admit hypersomnolence resolved with trach so need to be careful with removal until  Multiple pneumonias in the past Deconditioning DM 2 Stage III pressure  ulcer Insomnia   PLAN -  Continue ATC as tol  Continue PMV Speech following   Can downsize to #4 cuffless   No decannulation planned at this time r/t underlying likely severe OSA Will need outpt f/u in trach clinic and with sleep MD   Discussed with pt at length at bedside   PCCM will continue to f/u 1-2x/week   Nickolas Madrid, NP Pulmonary/Critical Care Medicine  01/16/2020  9:30 AM

## 2020-01-16 NOTE — Progress Notes (Signed)
Trach change from # 6 to #4 shiley cuffless. Color change on ETCO2. No noted respiratory issues.

## 2020-01-16 NOTE — Progress Notes (Signed)
Physical Therapy Session Note  Patient Details  Name: Clifford Nichols MRN: [id number] Date of Birth: 11-Jul-1981  Today's Date: 01/16/2020 PT Individual Time: 1310-1410 PT Individual Time Calculation (min): 60 min   Short Term Goals: Week 1:  PT Short Term Goal 1 (Week 1): Pt will perform sit<>stand and stand pivot transfers using LRAD with CGA PT Short Term Goal 2 (Week 1): Pt will ambulate at least 196ft using LRAD with CGA PT Short Term Goal 3 (Week 1): Pt will ascend/descend 8 steps using B HRs with mod assist  Skilled Therapeutic Interventions/Progress Updates:    Pt received seated EOB with wife and mother present and pt agreeable to therapy session. Patient and family report that he will be going to mother's house which is 1 story having 6STE front door and 3 STE back door both having bilateral handrails as well as a tub/shower combo with 1 grab bar in hall bathroom shower. Patient's family also confirms that he will have 24hr support at discharge. Therapist educated patient's family on his CLOF and his lack of muscle activation in bilateral ankle PF/DFs with plan for AFO consultation today. Thayer Ohm, orthotist, present for consultation but patient is not able to wear shoes currently available due to swelling in his feet - pt's family planning to bring larger shoes tomorrow (Friday). Pt received on 5L (28% FiO2) via trach collar with SpO2 95% at rest in room and HR 114bpm - transported to portable O2. Stand pivot to w/c using RW with CGA for safety. Gait training ~145ft, ~69ft (seated break between) using RW with CGA for safety and +2 for O2 line management and w/c follow - continues to demonstrate high steppage gait due to bilateral foot drop - after 1st walk SpO2 98% and HR 128bpm decreased to 117bpm then after 2nd walk SpO2 100% and HR 115bpm. Patient, orthotist, and therapist discussed AFO recommendations with plan to try bilateral [place] walk-on braces tomorrow when family brings in bigger  shoes.  Therapist removed the O2 via trach collar and pt's SpO2 96% or higher for remainder of session on RA. Participated in the following outcome measures:  Short Physical Performance Battery: Balance Side-by-side stand: 0 point - unable to perform this without holding onto RW Semi-tandem: 0 point - unsafe to attempt Tandem Stand: 0 point  - unsafe to attempt Gait Speed 1st walk (4 meters): 1 point (12.28 seconds) using RW 2nd walk (4 meters): 1 point (12.84 seconds) using RW *pt demonstrates 1x knee hyperextension during this gait due to LE fatigue* Repeated Chair Stand Test 5xSTS: 0 point - unable to perform  Total Score: 2 points out of 12 points possible  2-minute step test: Using RW - able to perform 14 steps but unable to participate in full 2 minutes (had to stop and sit at 40 seconds)  Transported back to room in w/c and hand-ff to [place], Rec Therapist.   Therapy Documentation Precautions:  Precautions Precautions: Fall Precaution Comments: monitor O2 and HR Restrictions Weight Bearing Restrictions: No  Pain: Reports bilateral foot pain throughout session - provided seated rest breaks for pain management but this does not limit pt's participation.    Therapy/Group: Individual Therapy  Ginny Forth, PT, DPT 01/16/2020, 1:51 PM

## 2020-01-16 NOTE — Progress Notes (Signed)
Patient ID: Clifford Nichols, male   DOB: [birth year], 39 y.o.   MRN: [id number]   Sw called respiratory to schedule trach education for patient and spouse. Respiratory to follow up.   [phone]

## 2020-01-17 ENCOUNTER — Inpatient Hospital Stay (HOSPITAL_COMMUNITY): Payer: BLUE CROSS/BLUE SHIELD | Admitting: Occupational Therapy

## 2020-01-17 ENCOUNTER — Inpatient Hospital Stay (HOSPITAL_COMMUNITY): Payer: BLUE CROSS/BLUE SHIELD | Admitting: Physical Therapy

## 2020-01-17 LAB — GLUCOSE, CAPILLARY
Glucose-Capillary: 106 mg/dL — ABNORMAL HIGH (ref 70–99)
Glucose-Capillary: 90 mg/dL (ref 70–99)
Glucose-Capillary: 101 mg/dL — ABNORMAL HIGH (ref 70–99)
Glucose-Capillary: 101 mg/dL — ABNORMAL HIGH (ref 70–99)

## 2020-01-17 MED ORDER — GABAPENTIN 300 MG PO CAPS
600.0000 mg | ORAL_CAPSULE | Freq: Three times a day (TID) | ORAL | Status: DC
  Administered 2020-01-17 – 2020-01-24 (×22): 600 mg via ORAL
  Filled 2020-01-17 (×22): qty 2

## 2020-01-17 NOTE — Progress Notes (Signed)
Pt on ATC 28%/5L with several episodes of desaturation during the night to upper 70's/ mid 80's while sleeping .  Saturation increased to upper 90's once pt woke up.

## 2020-01-17 NOTE — Progress Notes (Signed)
Trach care complete. Pt desaturating to 80% throughout night. When patient falls asleep his saturation levels drop to low 80's then rise back up to 98%+ in less then a minute after dropping. RRT was notified. He was unable to sleep due to alarm, I offered PRN medication for sleep but patient refused.

## 2020-01-17 NOTE — Progress Notes (Signed)
Occupational Therapy Session Note  Patient Details  Name: DUONG HAYDEL MRN: [id number] Date of Birth: 1980-11-02  Today's Date: 01/17/2020 OT Individual Time: 1510-1540 OT Individual Time Calculation (min): 30 min    Short Term Goals: Week 1:  OT Short Term Goal 1 (Week 1): Pt will complete LB bathing sit to stand with min assist using AE PRN. OT Short Term Goal 2 (Week 1): Pt will complete LB dressing sit to stand with supervision using AE PRN. OT Short Term Goal 3 (Week 1): Pt will maintain standing at the sink during grooming tasks for at least 3 mins with close supervision in order to increase overall endurance. OT Short Term Goal 4 (Week 1): Pt will complete toilet transfers with use of the RW and close supervision to elevated toilet.  Skilled Therapeutic Interventions/Progress Updates:    Pt worked on BUE strengthening sitting EOB during session.  His spouse was present throughout session and supportive.  He was able to complete 2 sets of 15 repetitions for shoulder row bilaterally using the medium orange resistance bands.  He also complete the same number of sets and reps for elbow flexion and elbow extension bilaterally with supervision.  Min assist was needed for use of the band with horizontal shoulder abduction for 2 sets of 15 secondary to the left shoulder being weaker.  He progressed to use of the 4 lb dowel rod for 2 sets of 15 with min assist on the LUE.  Finished session by having pt use the dowel rod to hit a beach ball back to therapist for 1 set of 20 and 2 sets of 30.  Rest breaks given in between all exercises, with pt demonstrating increased HR up into the mid to upper 130s with activity.  Oxygen sats 96-98% on room air.  Finished session with pt sitting EOB and spouse present.  Call button and phone in reach.   Therapy Documentation Precautions:  Precautions Precautions: Fall Precaution Comments: monitor O2 and HR Restrictions Weight Bearing Restrictions: No    Pain: Pain Assessment Pain Scale: Faces Pain Score: 0-No pain  Therapy/Group: Individual Therapy  Neri Samek OTR/L 01/17/2020, 4:24 PM

## 2020-01-17 NOTE — Progress Notes (Signed)
Occupational Therapy Session Note  Patient Details  Name: [name] MRN: [id number] Date of Birth: [birth year]  Today's Date: 01/17/2020 OT Individual Time: 3299-2426 OT Individual Time Calculation (min): 46 min    Short Term Goals: Week 1:  OT Short Term Goal 1 (Week 1): Pt will complete LB bathing sit to stand with min assist using AE PRN. OT Short Term Goal 2 (Week 1): Pt will complete LB dressing sit to stand with supervision using AE PRN. OT Short Term Goal 3 (Week 1): Pt will maintain standing at the sink during grooming tasks for at least 3 mins with close supervision in order to increase overall endurance. OT Short Term Goal 4 (Week 1): Pt will complete toilet transfers with use of the RW and close supervision to elevated toilet.  Skilled Therapeutic Interventions/Progress Updates:    Pt in bed to start session.  He is able to transfer to the EOB with supervision for work on bathing and dressing tasks.  He was able to complete all UB selfcare with supervision.  He then completed LB selfcare with mod assist overall and AE PRN (sockaide and reacher).  He was able to complete oral hygiene in sitting with setup assist.  Min assist was completed with LB selfcare during session at min guard assist with use of the RW for support.  His oxygen sats were at 94% on room air with HR in the upper 120's.  Finished session with pt left sitting up in the side of the bed per his request.  Pt is safe with this at this time.    Therapy Documentation Precautions:  Precautions Precautions: Fall Precaution Comments: monitor O2 and HR Restrictions Weight Bearing Restrictions: No   Vital Signs: Therapy Vitals Pulse Rate: 96 Resp: 20 Oxygen Therapy SpO2: 97 % O2 Device: Tracheostomy Collar O2 Flow Rate (L/min): 5 L/min FiO2 (%): 28 % Pain: Pain Assessment Pain Scale: Faces Pain Score: 0-No pain ADL: See Care Tool Section for some details of mobility and selfcare  Therapy/Group:  Individual Therapy  Kresta Templeman OTR/L 01/17/2020, 12:19 PM

## 2020-01-17 NOTE — Progress Notes (Signed)
Physical Therapy Session Note  Patient Details  Name: Clifford Nichols MRN: [id number] Date of Birth: October 16, 1980  Today's Date: 01/17/2020 PT Individual Time: 1300-1414 PT Individual Time Calculation (min): 74 min   Short Term Goals: Week 1:  PT Short Term Goal 1 (Week 1): Pt will perform sit<>stand and stand pivot transfers using LRAD with CGA PT Short Term Goal 2 (Week 1): Pt will ambulate at least 112ft using LRAD with CGA PT Short Term Goal 3 (Week 1): Pt will ascend/descend 8 steps using B HRs with mod assist  Skilled Therapeutic Interventions/Progress Updates:   Pt received supine in bed and agreeable to PT. Supine>sit transfer with supervision assist and cues for safety as pt very close of Edge of air mattress. Stand pivot transfer to [facility] with bariatric RW.   PT fit pt with shoes provided by wife. Gait training with 2 different shoes for assess fit prior to application of AFO. Due to increased sensitivity of Bil feet, pt reports better tolerance of athletic shoe compared to walking shoe.    Orthotist then present for fittting and of [place] reaction AFO for BLE. Gait training x 89ft with orthotist present and min assist from PT. Noted improvement clearance, and improved knee stability. Cues for decreased high stepping as brace providing adequate DF to allow clearance in swing.    UBE, random resistance, x 5 min forward/5 min reverse. O2 assessment every 2 min >97% throughout.   Additional Gait training training x 67ft with min assist for safety, one near LOB in turn due to narrow BOS, but able to correct following instruction from PT  Pt returned to room and performed stand pivot transfer to bed with bariatric RW, AFO and CGA. Pt left sitting EOB with wife present.      Therapy Documentation Precautions:  Precautions Precautions: Fall Precaution Comments: monitor O2 and HR Restrictions Weight Bearing Restrictions: No General:   Vital Signs: Therapy Vitals Temp: 98 F (36.7  C) Pulse Rate: 98 Resp: 19 BP: 113/65 Patient Position (if appropriate): Lying    Therapy/Group: Individual Therapy  Golden Pop 01/17/2020, 2:13 PM

## 2020-01-17 NOTE — Progress Notes (Signed)
Trach fell out Difficulty getting back in Issues with desats overnight Looks fine now. I think we can just leave out and try him on CPAP provided we can reinforce stoma site enough. Let's see how he does with this and will see him tomorrow  Myrla Halsted MD PCCM

## 2020-01-17 NOTE — Progress Notes (Signed)
PHYSICAL MEDICINE & REHABILITATION PROGRESS NOTE   Subjective/Complaints:  No issues overnite except bilateral foot pain, pain is constant involves entire foot, worse at noc , no trauma, still has poor sensation in B feet   ROS - CP, SOB, N/V/D  Objective:   No results found. No results for input(s): WBC, HGB, HCT, PLT in the last 72 hours. No results for input(s): NA, K, CL, CO2, GLUCOSE, BUN, CREATININE, CALCIUM in the last 72 hours.  Intake/Output Summary (Last 24 hours) at 01/17/2020 0746 Last data filed at 01/17/2020 0435 Gross per 24 hour  Intake 705 ml  Output 4300 ml  Net -3595 ml     Physical Exam: Vital Signs Blood pressure 139/79, pulse 97, temperature 98.9 F (37.2 C), resp. rate 18, weight (!) 151 kg, SpO2 95 %.   General: No acute distress Mood and affect are appropriate Heart: Regular rate and rhythm no rubs murmurs or extra sounds Lungs: Clear to auscultation, breathing unlabored, no rales or wheezes Abdomen: Positive bowel sounds, soft nontender to palpation, nondistended Extremities: No clubbing, cyanosis, or edema Skin: No evidence of breakdown, no evidence of rash  Neurologic: Cranial nerves II through XII intact, motor strength is 5/5 in bilateral deltoid, bicep, tricep, grip, hip flexor, knee extensors,0/5  ankle dorsiflexor and plantar flexor Sensory examabsent sensation to light touch and proprioception right foot , absent LT left foot reduced proprio  Musculoskeletal: Full range of motion in all 4 extremities. No joint swelling     Assessment/Plan: 1. Functional deficits secondary to Critical illness polyneuropathy  which require 3+ hours per day of interdisciplinary therapy in a comprehensive inpatient rehab setting.  Physiatrist is providing close team supervision and 24 hour management of active medical problems listed below.  Physiatrist and rehab team continue to assess barriers to discharge/monitor patient progress toward  functional and medical goals  Care Tool:  Bathing    Body parts bathed by patient: Right arm, Left arm, Chest, Abdomen, Front perineal area, Right upper leg, Left upper leg, Face, Right lower leg, Left lower leg   Body parts bathed by helper: Buttocks     Bathing assist Assist Level: Moderate Assistance - Patient 50 - 74%     Upper Body Dressing/Undressing Upper body dressing   What is the patient wearing?: Pull over shirt    Upper body assist Assist Level: Set up assist    Lower Body Dressing/Undressing Lower body dressing      What is the patient wearing?: Pants     Lower body assist Assist for lower body dressing: Minimal Assistance - Patient > 75%     Toileting Toileting    Toileting assist Assist for toileting: Minimal Assistance - Patient > 75% Assistive Device Comment: to bathroom   Transfers Chair/bed transfer  Transfers assist     Chair/bed transfer assist level: Contact Guard/Touching assist Chair/bed transfer assistive device: Geologist, engineering   Ambulation assist   Ambulation activity did not occur: Safety/medical concerns (required use of RW (does not use AD baseline))  Assist level: 2 helpers (CGA & +2 for O2 line management & w/c follow) Assistive device: Walker-rolling Max distance: 169ft   Walk 10 feet activity   Assist  Walk 10 feet activity did not occur: Safety/medical concerns  Assist level: 2 helpers (CGA & +2 for O2 line management & w/c follow) Assistive device: Walker-rolling   Walk 50 feet activity   Assist Walk 50 feet with 2 turns activity did not occur: Safety/medical  concerns  Assist level: 2 helpers (CGA & +2 for O2 line management & w/c follow) Assistive device: Walker-rolling    Walk 150 feet activity   Assist Walk 150 feet activity did not occur: Safety/medical concerns         Walk 10 feet on uneven surface  activity   Assist Walk 10 feet on uneven surfaces activity did not occur:  Safety/medical concerns         Wheelchair     Assist Will patient use wheelchair at discharge?:  (TBD but do not anticipate)             Wheelchair 50 feet with 2 turns activity    Assist            Wheelchair 150 feet activity     Assist          Blood pressure 139/79, pulse 97, temperature 98.9 F (37.2 C), resp. rate 18, weight (!) 151 kg, SpO2 95 %.  Medical Problem List and Plan: 1.   Reduced mobility and self-care skills secondary to critical illness polyneuropathy -patient may not shower -ELOS/Goals: 7 to 10 days 2. Antithrombotics: -DVT/anticoagulation:Pharmaceutical:Lovenox80mg  QD per Pharm -antiplatelet therapy: N/A 3. Pain Management:ON oxycodone 10 mg every 4 hours--denies any issues with pain. Attempt to start wean over next few days. Increased  Gabapentin 400mg   TID  for neuropathy.no better ,  increase to 600mg   4. Mood:LCSW to follow for evaluation and support. -antipsychotic agents: N/A 5. Neuropsych: This patientiscapable of making decisions onhisown behalf. 6.Sacral decub/Skin/Wound Care:Hydrotherapy to sacral wound 5 X week with santyl wet to dry dressing changes. Continue Zinc and vitamin C. Will add protein supplement to promote healing. 7. Fluids/Electrolytes/Nutrition:Monitor I/O. Check lytes in am.  8. HTN: Monitor BP tid--controlled without meds. Vitals:   01/17/20 0405 01/17/20 0525  BP:  139/79  Pulse: 97 97  Resp: 20 18  Temp:  98.9 F (37.2 C)  SpO2: 95%   controlled 6/25 9. Covid PNA with hypoxemic respiratory failure:Keep HOB >35 degrees at nights.On 5 L oxygen per ATC.- also with chronic sleep apnea, will clarify plans for decannulation vs home with trach , pulm following  10. New diagnosis T2DM: Hgb A1C-6.6. Will transition from levemir to oral hypoglycemic regimen. Change diet to CM. Continue to monitor BS ac/hs and titrate medication as  indicated.  CBG (last 3)  Recent Labs    01/16/20 1632 01/16/20 2110 01/17/20 0605  GLUCAP 76 96 106*  may be able to stop metformin is CBGs running <100 11. Anemia of critical illness: Is improving from drop to 7.7-->9.6 12. Morbid obesity: Educated on weight loss and healthy diet. 13. OSA: Has had study years ago but could not afford CPAP per mother. Will need sleep study after discharge. 14. Resting Tachycardia: Likely due to debility--HR in 110-120.If BP is up can add BB  Monitor triggering and keeping pt awake, will ask nsg to change setting , looks like monitor triggering at 100bpm, should move up to 110   LOS: 4 days A FACE TO FACE EVALUATION WAS PERFORMED  Clifford Nichols 01/17/2020, 7:46 AM

## 2020-01-17 NOTE — Progress Notes (Signed)
RT called to room to change humidification bottle and do 1600 trach check. RT noticed trach was not in position. Two RT's tried to reinsert without success. CCM called to bedside and decided to decannulate. Patient on room air with 02 saturations of 98%. Patient tolerating well at this time. Wife at bedside.

## 2020-01-18 ENCOUNTER — Inpatient Hospital Stay (HOSPITAL_COMMUNITY): Payer: BLUE CROSS/BLUE SHIELD | Admitting: Physical Therapy

## 2020-01-18 ENCOUNTER — Inpatient Hospital Stay (HOSPITAL_COMMUNITY): Payer: BLUE CROSS/BLUE SHIELD | Admitting: Occupational Therapy

## 2020-01-18 DIAGNOSIS — Z9889 Other specified postprocedural states: Secondary | ICD-10-CM

## 2020-01-18 DIAGNOSIS — G4733 Obstructive sleep apnea (adult) (pediatric): Secondary | ICD-10-CM

## 2020-01-18 LAB — GLUCOSE, CAPILLARY
Glucose-Capillary: 102 mg/dL — ABNORMAL HIGH (ref 70–99)
Glucose-Capillary: 101 mg/dL — ABNORMAL HIGH (ref 70–99)
Glucose-Capillary: 100 mg/dL — ABNORMAL HIGH (ref 70–99)

## 2020-01-18 MED ORDER — DULOXETINE HCL 30 MG PO CPEP
60.0000 mg | ORAL_CAPSULE | Freq: Every day | ORAL | Status: DC
  Administered 2020-01-24: 60 mg via ORAL
  Filled 2020-01-18: qty 2

## 2020-01-18 MED ORDER — DULOXETINE HCL 30 MG PO CPEP
30.0000 mg | ORAL_CAPSULE | Freq: Every day | ORAL | Status: AC
  Administered 2020-01-18 – 2020-01-23 (×6): 30 mg via ORAL
  Filled 2020-01-18 (×6): qty 1

## 2020-01-18 NOTE — Progress Notes (Signed)
Occupational Therapy Session Note  Patient Details  Name: Clifford Nichols MRN: [id number] Date of Birth: 1980-12-24  Today's Date: 01/18/2020 OT Individual Time: 8786-7672 OT Individual Time Calculation (min): 55 min    Short Term Goals: Week 1:  OT Short Term Goal 1 (Week 1): Pt will complete LB bathing sit to stand with min assist using AE PRN. OT Short Term Goal 2 (Week 1): Pt will complete LB dressing sit to stand with supervision using AE PRN. OT Short Term Goal 3 (Week 1): Pt will maintain standing at the sink during grooming tasks for at least 3 mins with close supervision in order to increase overall endurance. OT Short Term Goal 4 (Week 1): Pt will complete toilet transfers with use of the RW and close supervision to elevated toilet.  Skilled Therapeutic Interventions/Progress Updates:    Pt completed bathing and dressing sit to stand from the EOB.  He was able to transfer from supine to sitting with supervision.  UB selfcare was completed with setup.  Oxygen sats monitored on room air at 95% with HR at 105 when completing UB selfcare in sitting.  He was able to perform LB bathing sit to stand with mod assist, washing all parts except for his buttocks which required mod assist secondary to wound bandages and decreased AROM in the shoulder.  He was able to reach down to his feet for washing the tops of them and then utilized the sockaide for donning his gripper socks.  Min guard assist for donning his slip on shorts and standing to pull them up over his hips.  Finished session with pt sitting on the EOB and call button and phone in reach. O2 sats remained at 93% after standing with HR increasing up to around 130.    Therapy Documentation Precautions:  Precautions Precautions: Fall Precaution Comments: monitor O2 and HR Restrictions Weight Bearing Restrictions: No   Pain: Pain Assessment Pain Scale: Faces Faces Pain Scale: Hurts a little bit Pain Type: Acute pain Pain Location:  Foot Pain Orientation: Right;Left Pain Descriptors / Indicators: Discomfort Pain Onset: On-going Pain Intervention(s): Medication (See eMAR);Repositioned ADL: See Care Tool Section for some details of mobility and selfcare  Therapy/Group: Individual Therapy  Laylonie Marzec OTR/L 01/18/2020, 12:19 PM

## 2020-01-18 NOTE — Plan of Care (Signed)
°  Problem: Education: Goal: Knowledge about tracheostomy care/management will improve Outcome: Progressing   Problem: Activity: Goal: Ability to tolerate increased activity will improve Outcome: Progressing   Problem: Respiratory: Goal: Patent airway maintenance will improve Outcome: Progressing   Problem: Role Relationship: Goal: Ability to communicate will improve Outcome: Progressing   Problem: RH SKIN INTEGRITY Goal: RH STG SKIN FREE OF INFECTION/BREAKDOWN Outcome: Progressing Goal: RH STG MAINTAIN SKIN INTEGRITY WITH ASSISTANCE Description: STG Maintain Skin Integrity With min Assistance. Outcome: Progressing Goal: RH STG ABLE TO PERFORM INCISION/WOUND CARE W/ASSISTANCE Description: STG Able To Perform Incision/Wound Care With min Assistance. Outcome: Progressing   Problem: RH SAFETY Goal: RH STG ADHERE TO SAFETY PRECAUTIONS W/ASSISTANCE/DEVICE Description: STG Adhere to Safety Precautions With Mod I  Assistance/Device. Outcome: Progressing   Problem: RH PAIN MANAGEMENT Goal: RH STG PAIN MANAGED AT OR BELOW PT'S PAIN GOAL Description: Pain goal less than 5 Outcome: Progressing   Problem: RH KNOWLEDGE DEFICIT GENERAL Goal: RH STG INCREASE KNOWLEDGE OF SELF CARE AFTER HOSPITALIZATION Description: Patient will be able to direct care at discharge using educational resources independently Outcome: Progressing   Problem: Consults Goal: RH GENERAL PATIENT EDUCATION Description: See Patient Education module for education specifics. Outcome: Progressing   

## 2020-01-18 NOTE — Progress Notes (Signed)
Patient slept with CPAP last night with no complications . He slept majority of the night with 02 at 90-100 %. He stated that pain medication lessened pain in feet to a level 7/8 out of 10, but the pain was never relived. No other concerns to report at this time. Clifford Lemonds, Lpn

## 2020-01-18 NOTE — Progress Notes (Signed)
Patient on room air with Sp02=96%. Patient has no c/o being short of breath at this time. Will place patient on CPAP later tonight.

## 2020-01-18 NOTE — Progress Notes (Signed)
NAME:  Clifford Nichols, MRN:  [id number], DOB:  October 07, 1980, LOS: 5 [facility] ADMISSION DATE:  01/13/2020, [facility] Admission DATE:  12/04/19 REFERRING MD:  Dr Mortimer Fries, CCM, CHIEF COMPLAINT:  Acute respiratory failure   Brief History   39 year old obese man with hypertension, diabetes admitted with acute respiratory failure, ARDS from COVID-19 pneumonia, initial positive test 5/10  Past Medical History    has a past medical history of Abscess of upper back excluding scapular region, Diabetes mellitus without complication ([place]), GERD (gastroesophageal reflux disease), Hypertension, and Sebaceous cyst.  Significant Hospital Events   5/10 admitted [facility]  5/11 failed HHFNC, BiPAP.  Required intubation 5/12 transferred to [facility], evaluated by opthal for proptosis- no intervention required 5/17 Started paralytics, ECMO team consulted Not a candidate 5/19 Started proning 5/23 Off pressors 5/26 Proning stopped, continue paralytics 5/27 New tongue wound 2/2 bite block in place, weaned off ketamine. Daily fevers 5/28 Trach, stop paralytics 6/1 Out of airborne isolation, family visiting, on PSV weans 6/6 Tolerated ATC yesterday for 12 hrs 6/7 remained on ATC 6/11 remains on ATC  Consults:  Ophthalmology for proptosis 5/12  Procedures:  ETT 5/11 >> 5/28 L femoral CVC 5/11 >> 5/17 R radial art line 5/17 >> 5/28 L IJ CVC 5/17 >> 6/1  Trach 5/28  Significant Diagnostic Tests:  Head CT 5/11 >> no significant intracranial abnormality.  Scattered opacification and air-fluid levels in the frontal, ethmoid, sphenoid and maxillary sinuses of unclear significance.  Head CT 5/12 >> no significant intracranial abnormality  Lower extremity Doppler 5/14 >> negative for DVT  Micro Data:  SARS CoV2 5/10 >> positive MRSA screen 5/12 >> negative Respiratory 5/12 >> negative Blood 5/17 >> Staph epi and Staph hominis Respiratory 5/17 >> Staph epi 5/24 resp cx bal: staph epi 5/24 BAL acid fast: smear negative cx  pending 5/24 BAL fungal:pending 5/26 blood: No growth BAL 5/28-Acinetobacter, Proteus, Pseudomonas, Enterococcus Trach asp 6/1- Pseudomonas  Antimicrobials:  Remdesivir 5/11 >> 5/15 Dexamethasone 5/11 >> 5/20 Tocilizumab 5/12 Cefepime 5/17 >> 5/21 Linezolid 5/17 >> 5/23 Zosyn 5/21 >>  5/29 Vanco 5/28 >> 6/1  Scheduled Meds: . vitamin C  500 mg Oral Daily  . Chlorhexidine Gluconate Cloth  6 each Topical Daily  . DULoxetine  30 mg Oral Daily   Followed by  . [START ON 01/24/2020] DULoxetine  60 mg Oral Daily  . enoxaparin (LOVENOX) injection  80 mg Subcutaneous Q24H  . gabapentin  600 mg Oral TID  . insulin aspart  0-15 Units Subcutaneous TID WC  . insulin aspart  0-5 Units Subcutaneous QHS  . melatonin  3 mg Oral QHS  . metFORMIN  500 mg Oral Q breakfast  . multivitamin with minerals  1 tablet Oral Daily  . nutrition supplement (JUVEN)  1 packet Oral BID BM  . oxyCODONE  10 mg Oral Q4H  . potassium chloride  20 mEq Oral Daily  . Ensure Max Protein  11 oz Oral BID  . zinc sulfate  220 mg Oral Daily   Continuous Infusions:  PRN Meds:.acetaminophen, alum & mag hydroxide-simeth, bisacodyl, diphenhydrAMINE, guaiFENesin, guaiFENesin-dextromethorphan, lip balm, polyethylene glycol, prochlorperazine **OR** prochlorperazine **OR** prochlorperazine, sodium phosphate, traZODone, zolpidem   Interim history/subjective:  Self-decannulated  6/25. Slept with CPAP last night w/o issue. Denies complaints. No significant sputum production.  Objective   Blood pressure 122/73, pulse 92, temperature 98.4 F (36.9 C), resp. rate 19, weight (!) 157.9 kg, SpO2 94 %.        Intake/Output Summary (Last 24  hours) at 01/18/2020 1304 Last data filed at 01/18/2020 1100 Gross per 24 hour  Intake 582 ml  Output 1425 ml  Net -843 ml   Filed Weights   01/15/20 0623 01/17/20 0525 01/18/20 0616  Weight: (!) 151 kg (!) 151 kg (!) 157.9 kg    Examination: Laying in bed face down, easily moving around  in bed. Bandage over trach site- no bleeding but mild mucus trapped over opening. Normal voice.  Breathing comfortably on RA, speaking in full sentences. RRR Answering questions appropriately, moving extremities   Resolved Hospital Problem list    Left proptosis, noted with coughing early 5/12, resolved.  Apparently per wife he has episodic intermittent proptosis at home as well. Head CT 5/12 reassuring Ophthalmology did evaluate, recommended that he will need outpatient evaluation but no intervention at this time beyond keeping the eyes moist.  Septic shock, present on admission. Tongue necrosis secondary to biting down Hypertriglyceridemia Acinetobacter HAP Acute toxic metabolic encephalopathy    Assessment & Plan:   Severe ARDS due to COVID-19 pneumonia  S/p trach --> decannulated by self on 6/25 Morbid obesity,  OSA with pre admit hypersomnolence resolved with trach  Multiple pneumonias in the past Deconditioning DM 2 Stage III pressure ulcer Insomnia  -must have CPAP whenever sleeping, including naps -requires tight occlusive dressing over trach site    Julian Hy, DO 01/18/20 1:18 PM [facility]

## 2020-01-18 NOTE — Progress Notes (Signed)
[name] PHYSICAL MEDICINE & REHABILITATION PROGRESS NOTE   Subjective/Complaints:  C/O severe nerve pain in feet- continuous burning, shooting pain.  Wants to try additional meds- Gabapentin not working well enough-    Denies SOB- self decannulated himself yesterday- frustrated because pulse ox keeps beeping- when I was in room- his sats stayed 95-100, but machine KEEPS beeping- heart rate would go up to ~105 and appeared to be the cause of beeping, which is inappropriate. Marland Kitchen    ROS - Pt denies SOB, abd pain, CP, N/V/C/D, and vision changes   Objective:   No results found. No results for input(s): WBC, HGB, HCT, PLT in the last 72 hours. No results for input(s): NA, K, CL, CO2, GLUCOSE, BUN, CREATININE, CALCIUM in the last 72 hours.  Intake/Output Summary (Last 24 hours) at 01/18/2020 1400 Last data filed at 01/18/2020 1100 Gross per 24 hour  Intake 582 ml  Output 1425 ml  Net -843 ml     Physical Exam: Vital Signs Blood pressure 122/73, pulse 92, temperature 98.4 F (36.9 C), resp. rate 19, weight (!) 157.9 kg, SpO2 94 %.   General: awake, alert, appropriate, laying on R side in bed- rolled to stomach easily, NAD HENT: dressing over trach site- tight- C/D/I Appropriate affect Heart: borderline tachycardia- regular rhythm Lungs: CTA B/L- no W/R/R- good air movement Abdomen: Soft, NT, ND, (+)BS  Extremities: No clubbing, cyanosis, or edema Skin: No evidence of breakdown, no evidence of rash  Neurologic: Cranial nerves II through XII intact, motor strength is 5/5 in bilateral deltoid, bicep, tricep, grip, hip flexor, knee extensors,0/5  ankle dorsiflexor and plantar flexor Sensory examabsent sensation to light touch and proprioception right foot , absent LT left foot reduced proprio  Musculoskeletal: Full range of motion in all 4 extremities. No joint swelling     Assessment/Plan: 1. Functional deficits secondary to Critical illness polyneuropathy  which require 3+  hours per day of interdisciplinary therapy in a comprehensive inpatient rehab setting.  Physiatrist is providing close team supervision and 24 hour management of active medical problems listed below.  Physiatrist and rehab team continue to assess barriers to discharge/monitor patient progress toward functional and medical goals  Care Tool:  Bathing    Body parts bathed by patient: Right arm, Left arm, Chest, Abdomen, Front perineal area, Right upper leg, Left upper leg, Face, Right lower leg, Left lower leg   Body parts bathed by helper: Buttocks     Bathing assist Assist Level: Moderate Assistance - Patient 50 - 74%     Upper Body Dressing/Undressing Upper body dressing   What is the patient wearing?: Pull over shirt    Upper body assist Assist Level: Set up assist    Lower Body Dressing/Undressing Lower body dressing      What is the patient wearing?: Pants     Lower body assist Assist for lower body dressing: Contact Guard/Touching assist     Toileting Toileting    Toileting assist Assist for toileting: Minimal Assistance - Patient > 75% Assistive Device Comment: to bathroom   Transfers Chair/bed transfer  Transfers assist     Chair/bed transfer assist level: Contact Guard/Touching assist Chair/bed transfer assistive device: Walker   Locomotion Ambulation   Ambulation assist   Ambulation activity did not occur: Safety/medical concerns (required use of RW (does not use AD baseline))  Assist level: 2 helpers (CGA & +2 for O2 line management & w/c follow) Assistive device: Walker-rolling Max distance: 135ft   Walk 10 feet  activity   Assist  Walk 10 feet activity did not occur: Safety/medical concerns  Assist level: 2 helpers (CGA & +2 for O2 line management & w/c follow) Assistive device: Walker-rolling   Walk 50 feet activity   Assist Walk 50 feet with 2 turns activity did not occur: Safety/medical concerns  Assist level: 2 helpers (CGA & +2  for O2 line management & w/c follow) Assistive device: Walker-rolling    Walk 150 feet activity   Assist Walk 150 feet activity did not occur: Safety/medical concerns         Walk 10 feet on uneven surface  activity   Assist Walk 10 feet on uneven surfaces activity did not occur: Safety/medical concerns         Wheelchair     Assist Will patient use wheelchair at discharge?:  (TBD but do not anticipate)             Wheelchair 50 feet with 2 turns activity    Assist            Wheelchair 150 feet activity     Assist          Blood pressure 122/73, pulse 92, temperature 98.4 F (36.9 C), resp. rate 19, weight (!) 157.9 kg, SpO2 94 %.  Medical Problem List and Plan: 1.   Reduced mobility and self-care skills secondary to critical illness polyneuropathy -patient may not shower? Since trach is out, would think pt can shower -ELOS/Goals: 7 to 10 days 2. Antithrombotics: -DVT/anticoagulation:Pharmaceutical:Lovenox80mg  QD per Pharm -antiplatelet therapy: N/A 3. Pain Management:ON oxycodone 10 mg every 4 hours--denies any issues with pain. Attempt to start wean over next few days. Increased  Gabapentin 400mg   TID  for neuropathy.no better ,  increase to 600mg    6/26- added Duloxetine 30 mg daily x 6 days then 60 mg daily- for nerve pain 4. Mood:LCSW to follow for evaluation and support. -antipsychotic agents: N/A 5. Neuropsych: This patientiscapable of making decisions onhisown behalf. 6.Sacral decub/Skin/Wound Care:Hydrotherapy to sacral wound 5 X week with santyl wet to dry dressing changes. Continue Zinc and vitamin C. Will add protein supplement to promote healing. 7. Fluids/Electrolytes/Nutrition:Monitor I/O. Check lytes in am.  8. HTN: Monitor BP tid--controlled without meds. Vitals:   01/18/20 0035 01/18/20 0608  BP:  122/73  Pulse: (!) 106 92  Resp: 20 19  Temp:  98.4  F (36.9 C)  SpO2: 94%    6/26- BP controlled- pulse slightly tachycardic, but overall controlled- con't regimen 9. Covid PNA with hypoxemic respiratory failure:Keep HOB >35 degrees at nights.On 5 L oxygen per ATC.- also with chronic sleep apnea, will clarify plans for decannulation vs home with trach , pulm following  10. New diagnosis T2DM: Hgb A1C-6.6. Will transition from levemir to oral hypoglycemic regimen. Change diet to CM. Continue to monitor BS ac/hs and titrate medication as indicated.  CBG (last 3)  Recent Labs    01/17/20 1649 01/17/20 2046 01/18/20 0609  GLUCAP 101* 101* 102*   6/26- will d/w pt stopping metformin- is 500 mg daily 11. Anemia of critical illness: Is improving from drop to 7.7-->9.6 12. Morbid obesity- BMI 54.5: Educated on weight loss and healthy diet. 13. OSA: Has had study years ago but could not afford CPAP per mother. Will need sleep study after discharge. 14. Resting Tachycardia: Likely due to debility--HR in 110-120.If BP is up can add BB  Monitor triggering and keeping pt awake, will ask nsg to change setting , looks like monitor triggering  at 100bpm, should move up to 110   6/26- will either change monitor setting or stop- since O2 sats look great- >93% in last 24+ hours.    LOS: 5 days A FACE TO FACE EVALUATION WAS PERFORMED  Clifford Nichols 01/18/2020, 2:00 PM

## 2020-01-18 NOTE — Progress Notes (Signed)
Occupational Therapy Session Note  Patient Details  Name: Clifford Nichols MRN: [id number] Date of Birth: 06/23/1981  Today's Date: 01/18/2020 OT Individual Time: 1300-1340 OT Individual Time Calculation (min): 40 min   Short Term Goals: Week 1:  OT Short Term Goal 1 (Week 1): Pt will complete LB bathing sit to stand with min assist using AE PRN. OT Short Term Goal 2 (Week 1): Pt will complete LB dressing sit to stand with supervision using AE PRN. OT Short Term Goal 3 (Week 1): Pt will maintain standing at the sink during grooming tasks for at least 3 mins with close supervision in order to increase overall endurance. OT Short Term Goal 4 (Week 1): Pt will complete toilet transfers with use of the RW and close supervision to elevated toilet.  Skilled Therapeutic Interventions/Progress Updates:    Pt greeted in bed and agreeable to tx. ADL needs met. Supine<sit completed unassisted. To work on UB strengthening and endurance, guided pt through B UE exercises using 3# bar x15 reps 2 sets. Note that pt needed a rest break after completing ~3 different exercises, 02 sats assessed at this time with readings 98-100% on RA, HR 120-130s range. Pt reports HR is normally high. OT also instructed pt through gentle UB stretches, emphasis placed on coordinating breathing with movement. To help with neuropathic pain in feet, we placed his feet in warm water using 2 basins. After about 15 minutes of warm water soak, pt rated his pain had dropped from 8/10 (at start of session) to 2/10. Skin intact after warm water soak. Pt remained EOB at end of session with all needs within reach.     Therapy Documentation Precautions:  Precautions Precautions: Fall Precaution Comments: monitor O2 and HR Restrictions Weight Bearing Restrictions: No Vital Signs: Therapy Vitals Temp: 98.6 F (37 C) Pulse Rate: (!) 107 Resp: 18 BP: 118/67 Oxygen Therapy SpO2: 94 % ADL: ADL Eating: Independent Where Assessed-Eating:  Wheelchair Grooming: Setup Where Assessed-Grooming: Wheelchair Upper Body Bathing: Setup Where Assessed-Upper Body Bathing: Edge of bed Lower Body Bathing: Moderate assistance Where Assessed-Lower Body Bathing: Edge of bed Upper Body Dressing: Setup Where Assessed-Upper Body Dressing: Edge of bed Lower Body Dressing: Maximal assistance Where Assessed-Lower Body Dressing: Edge of bed Toileting: Maximal assistance Where Assessed-Toileting: Bed level Toilet Transfer: Minimal assistance Toilet Transfer Method: Counselling psychologist: Grab bars, Raised toilet seat     Therapy/Group: Individual Therapy  Kaho Selle A Mahreen Schewe 01/18/2020, 4:47 PM

## 2020-01-18 NOTE — Progress Notes (Signed)
Physical Therapy Session Note  Patient Details  Name: Clifford Nichols MRN: [id number] Date of Birth: [birth year]  Today's Date: 01/18/2020 PT Individual Time: 1140-1205 AND 1655-1735 PT Individual Time Calculation (min): 25 min and 40 min   Short Term Goals: Week 1:  PT Short Term Goal 1 (Week 1): Pt will perform sit<>stand and stand pivot transfers using LRAD with CGA PT Short Term Goal 2 (Week 1): Pt will ambulate at least 167f using LRAD with CGA PT Short Term Goal 3 (Week 1): Pt will ascend/descend 8 steps using B HRs with mod assist  Skilled Therapeutic Interventions/Progress Updates: \  Session 1   Pt received supine in bed and agreeable to PT. Supine>sit transfer with supervision assist for safety. PT assisted pt to don shoes and BAFO with max assist. [facility] and supervision assist and cues for step width.   Gait training 79fx 2 with CGA-Supervision assist as bariatric RW. Cues for safety in turns to maintain wide BOS and decreased hip flexion to advance the LE. Forward/reverse in parallel bars 2 x 43f85fith supervision assist and verbal instructed to proper step length and width to reduce fall ristk  stanidng hip abduction in parallel bars 2 x 8 BLE with cues for posture, no GR, and proper ROM.   Patient returned to room and left sitting in EOB with call bell in reach and all needs met.     Session 2.   Pt received supine in bed and agreeable to PT. Supine>sit transfer without assist. PT assisted to don shoes and AFOs with max assist. Stand pivot [facility].  Pt transported to rehab gym.   Stair management training with BUE support on rails ascend/descend 1 step x 6 with min assist. Therapeutic rest break provided and pt performed anterior Ascent and posterior descent due to use of BAFO.   Gait training with  baritric RW x 140f55fth supervision assist for safety. Pt noted to have improved gait pattern with decreased hip  flexion to advance BLE. SpO2: 97%.   WC mobility with BUE x 150ft45froom for cardiovascular endurance training. Cues for turning technique and use of pursed lip breathing to maximize endurance training.   Patient returned to room and left sitting in EOB with call bell in reach and all needs met.         Therapy Documentation Precautions:  Precautions Precautions: Fall Precaution Comments: monitor O2 and HR Restrictions Weight Bearing Restrictions: No Vital Signs: Therapy Vitals Temp: 98.6 F (37 C) Pulse Rate: (!) 107 Resp: 18 BP: 118/67 Patient Position (if appropriate): Lying Oxygen Therapy SpO2: 94 % O2 Device: Room Air Pain: Pain Assessment Pain Scale: 0-10 Pain Score: 0-No pain    Therapy/Group: Individual Therapy  AustiLorie Phenix/2021, 4:56 PM

## 2020-01-19 LAB — GLUCOSE, CAPILLARY
Glucose-Capillary: 103 mg/dL — ABNORMAL HIGH (ref 70–99)
Glucose-Capillary: 101 mg/dL — ABNORMAL HIGH (ref 70–99)
Glucose-Capillary: 95 mg/dL (ref 70–99)
Glucose-Capillary: 96 mg/dL (ref 70–99)

## 2020-01-19 NOTE — Plan of Care (Signed)
  Problem: Education: Goal: Knowledge about tracheostomy care/management will improve Outcome: Progressing   Problem: Activity: Goal: Ability to tolerate increased activity will improve Outcome: Progressing   Problem: Respiratory: Goal: Patent airway maintenance will improve Outcome: Progressing   Problem: Role Relationship: Goal: Ability to communicate will improve Outcome: Progressing   Problem: RH SKIN INTEGRITY Goal: RH STG SKIN FREE OF INFECTION/BREAKDOWN Outcome: Progressing Goal: RH STG MAINTAIN SKIN INTEGRITY WITH ASSISTANCE Description: STG Maintain Skin Integrity With min Assistance. Outcome: Progressing Goal: RH STG ABLE TO PERFORM INCISION/WOUND CARE W/ASSISTANCE Description: STG Able To Perform Incision/Wound Care With min Assistance. Outcome: Progressing   Problem: RH SAFETY Goal: RH STG ADHERE TO SAFETY PRECAUTIONS W/ASSISTANCE/DEVICE Description: STG Adhere to Safety Precautions With Mod I  Assistance/Device. Outcome: Progressing   Problem: RH PAIN MANAGEMENT Goal: RH STG PAIN MANAGED AT OR BELOW PT'S PAIN GOAL Description: Pain goal less than 5 Outcome: Progressing   Problem: RH KNOWLEDGE DEFICIT GENERAL Goal: RH STG INCREASE KNOWLEDGE OF SELF CARE AFTER HOSPITALIZATION Description: Patient will be able to direct care at discharge using educational resources independently Outcome: Progressing   Problem: Consults Goal: RH GENERAL PATIENT EDUCATION Description: See Patient Education module for education specifics. Outcome: Progressing   

## 2020-01-19 NOTE — Progress Notes (Signed)
[facility] PHYSICAL MEDICINE & REHABILITATION PROGRESS NOTE   Subjective/Complaints:  Nursing have pulse ox in room, but off in between checks- vitals/pulse ox have been good- no drops documented, even at night, on CPAP- was 98%  Pt said no side effects from duloxetine- slept better without pulse ox constantly beeping.   ROS -  Pt denies SOB, abd pain, CP, N/V/C/D, and vision changes   Objective:   No results found. No results for input(s): WBC, HGB, HCT, PLT in the last 72 hours. No results for input(s): NA, K, CL, CO2, GLUCOSE, BUN, CREATININE, CALCIUM in the last 72 hours.  Intake/Output Summary (Last 24 hours) at 01/19/2020 1309 Last data filed at 01/19/2020 0850 Gross per 24 hour  Intake 720 ml  Output 1750 ml  Net -1030 ml     Physical Exam: Vital Signs Blood pressure (!) 141/99, pulse 96, temperature 97.9 F (36.6 C), temperature source Oral, resp. rate 20, weight (!) 158 kg, SpO2 98 %.   General: appropriate, laying on L side- on phone, NAD HENT: dressing over trach site- tight- C/D/I- no change Appropriate affect Heart: borderline tachycardia- regular rhythm Lungs: CTA B/L- no W/R/R- good air movement Abdomen: Soft, NT, ND, (+)BS , protuberant Extremities: No clubbing, cyanosis, or edema Skin: No evidence of breakdown, no evidence of rash Neurologic: Cranial nerves II through XII intact, motor strength is 5/5 in bilateral deltoid, bicep, tricep, grip, hip flexor, knee extensors,0/5  ankle dorsiflexor and plantar flexor Sensory examabsent sensation to light touch and proprioception right foot , absent LT left foot reduced proprio  Musculoskeletal: Full range of motion in all 4 extremities. No joint swelling     Assessment/Plan: 1. Functional deficits secondary to Critical illness polyneuropathy  which require 3+ hours per day of interdisciplinary therapy in a comprehensive inpatient rehab setting.  Physiatrist is providing close team supervision and 24 hour  management of active medical problems listed below.  Physiatrist and rehab team continue to assess barriers to discharge/monitor patient progress toward functional and medical goals  Care Tool:  Bathing    Body parts bathed by patient: Right arm, Left arm, Chest, Abdomen, Front perineal area, Right upper leg, Left upper leg, Face, Right lower leg, Left lower leg   Body parts bathed by helper: Buttocks     Bathing assist Assist Level: Moderate Assistance - Patient 50 - 74%     Upper Body Dressing/Undressing Upper body dressing   What is the patient wearing?: Pull over shirt    Upper body assist Assist Level: Set up assist    Lower Body Dressing/Undressing Lower body dressing      What is the patient wearing?: Pants     Lower body assist Assist for lower body dressing: Contact Guard/Touching assist     Toileting Toileting    Toileting assist Assist for toileting: Minimal Assistance - Patient > 75% Assistive Device Comment: to bathroom   Transfers Chair/bed transfer  Transfers assist     Chair/bed transfer assist level: Contact Guard/Touching assist Chair/bed transfer assistive device: Geologist, engineering   Ambulation assist   Ambulation activity did not occur: Safety/medical concerns (required use of RW (does not use AD baseline))  Assist level: 2 helpers (CGA & +2 for O2 line management & w/c follow) Assistive device: Walker-rolling Max distance: 157ft   Walk 10 feet activity   Assist  Walk 10 feet activity did not occur: Safety/medical concerns  Assist level: 2 helpers (CGA & +2 for O2 line management &  w/c follow) Assistive device: Walker-rolling   Walk 50 feet activity   Assist Walk 50 feet with 2 turns activity did not occur: Safety/medical concerns  Assist level: 2 helpers (CGA & +2 for O2 line management & w/c follow) Assistive device: Walker-rolling    Walk 150 feet activity   Assist Walk 150 feet activity did not occur:  Safety/medical concerns         Walk 10 feet on uneven surface  activity   Assist Walk 10 feet on uneven surfaces activity did not occur: Safety/medical concerns         Wheelchair     Assist Will patient use wheelchair at discharge?:  (TBD but do not anticipate)             Wheelchair 50 feet with 2 turns activity    Assist            Wheelchair 150 feet activity     Assist          Blood pressure (!) 141/99, pulse 96, temperature 97.9 F (36.6 C), temperature source Oral, resp. rate 20, weight (!) 158 kg, SpO2 98 %.  Medical Problem List and Plan: 1.   Reduced mobility and self-care skills secondary to critical illness polyneuropathy -patient may not shower? Since trach is out, would think pt can shower -ELOS/Goals: 7 to 10 days 2. Antithrombotics: -DVT/anticoagulation:Pharmaceutical:Lovenox80mg  QD per Pharm -antiplatelet therapy: N/A 3. Pain Management:ON oxycodone 10 mg every 4 hours--denies any issues with pain. Attempt to start wean over next few days. Increased  Gabapentin 400mg   TID  for neuropathy.no better ,  increase to 600mg    6/26- added Duloxetine 30 mg daily x 6 days then 60 mg daily- for nerve pain  6/27- tolerating well 4. Mood:LCSW to follow for evaluation and support. -antipsychotic agents: N/A 5. Neuropsych: This patientiscapable of making decisions onhisown behalf. 6.Sacral decub/Skin/Wound Care:Hydrotherapy to sacral wound 5 X week with santyl wet to dry dressing changes. Continue Zinc and vitamin C. Will add protein supplement to promote healing. 7. Fluids/Electrolytes/Nutrition:Monitor I/O. Check lytes in am.  8. HTN: Monitor BP tid--controlled without meds. Vitals:   01/18/20 2355 01/19/20 0401  BP:  (!) 141/99  Pulse: 81 96  Resp: 19 20  Temp:  97.9 F (36.6 C)  SpO2: 97% 98%   6/27- occ tachycardia- not all the time- BP just barely elevated- in  last 24 hours- con't regimen 9. Covid PNA with hypoxemic respiratory failure:Keep HOB >35 degrees at nights.On 5 L oxygen per ATC.- also with chronic sleep apnea, will clarify plans for decannulation vs home with trach , pulm following   6/27- pt decannulated SELF 6/25! 10. New diagnosis T2DM: Hgb A1C-6.6. Will transition from levemir to oral hypoglycemic regimen. Change diet to CM. Continue to monitor BS ac/hs and titrate medication as indicated.  CBG (last 3)  Recent Labs    01/18/20 2128 01/19/20 0609 01/19/20 1158  GLUCAP 100* 103* 101*   6/27- BGs well controlled- thought about d/c of metformin, however metformin can also help with weight loss- might continue.  11. Anemia of critical illness: Is improving from drop to 7.7-->9.6 12. Morbid obesity- BMI 54.5: Educated on weight loss and healthy diet. 13. OSA: Has had study years ago but could not afford CPAP per mother. Will need sleep study after discharge. 14. Resting Tachycardia: Likely due to debility--HR in 110-120.If BP is up can add BB  Monitor triggering and keeping pt awake, will ask nsg to change setting , looks  like monitor triggering at 100bpm, should move up to 110   6/26- will either change monitor setting or stop- since O2 sats look great- >93% in last 24+ hours.  6/27- slept better- vitals look great    LOS: 6 days A FACE TO FACE EVALUATION WAS PERFORMED  Joh Rao 01/19/2020, 1:09 PM

## 2020-01-19 NOTE — Progress Notes (Signed)
Placed patient on CPAP with pressure set at 10cm.

## 2020-01-20 ENCOUNTER — Inpatient Hospital Stay (HOSPITAL_COMMUNITY): Payer: BLUE CROSS/BLUE SHIELD | Admitting: Occupational Therapy

## 2020-01-20 ENCOUNTER — Inpatient Hospital Stay (HOSPITAL_COMMUNITY): Payer: BLUE CROSS/BLUE SHIELD

## 2020-01-20 DIAGNOSIS — Z930 Tracheostomy status: Secondary | ICD-10-CM

## 2020-01-20 LAB — CBC
HCT: 35.2 % — ABNORMAL LOW (ref 39.0–52.0)
Hemoglobin: 10.4 g/dL — ABNORMAL LOW (ref 13.0–17.0)
MCH: 29.1 pg (ref 26.0–34.0)
MCHC: 29.5 g/dL — ABNORMAL LOW (ref 30.0–36.0)
MCV: 98.6 fL (ref 80.0–100.0)
Platelets: 182 10*3/uL (ref 150–400)
RBC: 3.57 MIL/uL — ABNORMAL LOW (ref 4.22–5.81)
RDW: 14.2 % (ref 11.5–15.5)
WBC: 6.3 10*3/uL (ref 4.0–10.5)
nRBC: 0 % (ref 0.0–0.2)

## 2020-01-20 LAB — GLUCOSE, CAPILLARY
Glucose-Capillary: 118 mg/dL — ABNORMAL HIGH (ref 70–99)
Glucose-Capillary: 152 mg/dL — ABNORMAL HIGH (ref 70–99)
Glucose-Capillary: 109 mg/dL — ABNORMAL HIGH (ref 70–99)
Glucose-Capillary: 82 mg/dL (ref 70–99)
Glucose-Capillary: 166 mg/dL — ABNORMAL HIGH (ref 70–99)

## 2020-01-20 LAB — BASIC METABOLIC PANEL
Anion gap: 11 (ref 5–15)
BUN: 9 mg/dL (ref 6–20)
CO2: 24 mmol/L (ref 22–32)
Calcium: 9 mg/dL (ref 8.9–10.3)
Chloride: 102 mmol/L (ref 98–111)
Creatinine, Ser: 0.87 mg/dL (ref 0.61–1.24)
GFR calc Af Amer: >60 mL/min (ref >60)
GFR calc non Af Amer: >60 mL/min (ref >60)
Glucose, Bld: 106 mg/dL — ABNORMAL HIGH (ref 70–99)
Potassium: 4.3 mmol/L (ref 3.5–5.1)
Sodium: 137 mmol/L (ref 135–145)

## 2020-01-20 NOTE — Plan of Care (Signed)
  Problem: Education: Goal: Knowledge about tracheostomy care/management will improve Outcome: Progressing   Problem: Activity: Goal: Ability to tolerate increased activity will improve Outcome: Progressing   Problem: Respiratory: Goal: Patent airway maintenance will improve Outcome: Progressing   Problem: Role Relationship: Goal: Ability to communicate will improve Outcome: Progressing   Problem: RH SKIN INTEGRITY Goal: RH STG SKIN FREE OF INFECTION/BREAKDOWN Outcome: Progressing Goal: RH STG MAINTAIN SKIN INTEGRITY WITH ASSISTANCE Description: STG Maintain Skin Integrity With min Assistance. Outcome: Progressing Goal: RH STG ABLE TO PERFORM INCISION/WOUND CARE W/ASSISTANCE Description: STG Able To Perform Incision/Wound Care With min Assistance. Outcome: Progressing   Problem: RH SAFETY Goal: RH STG ADHERE TO SAFETY PRECAUTIONS W/ASSISTANCE/DEVICE Description: STG Adhere to Safety Precautions With Mod I  Assistance/Device. Outcome: Progressing   Problem: RH PAIN MANAGEMENT Goal: RH STG PAIN MANAGED AT OR BELOW PT'S PAIN GOAL Description: Pain goal less than 5 Outcome: Progressing   Problem: RH KNOWLEDGE DEFICIT GENERAL Goal: RH STG INCREASE KNOWLEDGE OF SELF CARE AFTER HOSPITALIZATION Description: Patient will be able to direct care at discharge using educational resources independently Outcome: Progressing   Problem: Consults Goal: RH GENERAL PATIENT EDUCATION Description: See Patient Education module for education specifics. Outcome: Progressing   

## 2020-01-20 NOTE — Progress Notes (Signed)
Physical Therapy Session Note  Patient Details  Name: Clifford Nichols MRN: [id number] Date of Birth: [birth year]  Today's Date: 01/20/2020 PT Individual Time: 1130-1210 PT Individual Time Calculation (min): 40 min   Short Term Goals: Week 1:  PT Short Term Goal 1 (Week 1): Pt will perform sit<>stand and stand pivot transfers using LRAD with CGA PT Short Term Goal 2 (Week 1): Pt will ambulate at least 165ft using LRAD with CGA PT Short Term Goal 3 (Week 1): Pt will ascend/descend 8 steps using B HRs with mod assist Week 2:    Week 3:     Skilled Therapeutic Interventions/Progress Updates:    PAIN bilat feet neuropathic type pain, treatment to tolerance.  Pt initially sidelying in bed and agreeable to treatment.  Pt transiitoned to sitting on edge of bed independently.  Therapist donned AFOs and shoes max assist for time efficiency.   STS and gait x 188ft w/RW and close supervision, wc follow.  02 sats 99, HR 105 Rests 4-5 min STS in parallel bars and performed the following therex: Marching x 5 each Hip abd x 5 each, mild buckling R knee w/min assist for recovery Sidestepping length of bars x 4 hamsring curls LLE x 8, RLE AAROM w/eccentric lowereing x 6 Rests in sitting 4-5 min Standing therex including: Static balance w/ bilat UE support decreased to single UE support, added cervical nods/rotation and eyes open/closed.  Increased sway w/challenges but no blance loss, cannot static stand without UE support/loses balance post/immediately w/attempt/uses bars to recover. Forward/retrogait 77ft x 6 w/use of bars, increased dificulty and decreased cadence w/single UE support Gait 129ft w/RW and wc follow, close supervision, inconsistent step length and mild wobbles at R knee w/loading.  Turn/sit to edge of bed w/close supervision.  Therapist removes AFOs/shoes max assit at end of session. Pt handed off to NT in room w/pt.  Therapy Documentation Precautions:  Precautions Precautions:  Fall Precaution Comments: monitor O2 and HR Restrictions Weight Bearing Restrictions: No    Therapy/Group: Individual Therapy  Rada Hay, PT   [name] 01/20/2020, 12:42 PM

## 2020-01-20 NOTE — Progress Notes (Signed)
[place] PHYSICAL MEDICINE & REHABILITATION PROGRESS NOTE   Subjective/Complaints: Continues to have burning in feet. Started on Cymbalta over the weekend.  Denies constipation. Remains on contact precautions. Working on self-care with OT.   ROS - Pt denies SOB, abd pain, CP, N/V/C/D, and vision changes   Objective:   No results found. Recent Labs    01/20/20 0456  WBC 6.3  HGB 10.4*  HCT 35.2*  PLT 182   Recent Labs    01/20/20 0456  NA 137  K 4.3  CL 102  CO2 24  GLUCOSE 106*  BUN 9  CREATININE 0.87  CALCIUM 9.0    Intake/Output Summary (Last 24 hours) at 01/20/2020 1025 Last data filed at 01/20/2020 0011 Gross per 24 hour  Intake 480 ml  Output 900 ml  Net -420 ml     Physical Exam: Vital Signs Blood pressure 127/75, pulse 93, temperature 99 F (37.2 C), resp. rate 18, weight (!) 155.6 kg, SpO2 100 %. General: Alert and oriented x 3, No apparent distress HEENT: Head is normocephalic, atraumatic, PERRLA, EOMI, sclera anicteric, oral mucosa pink and moist, dentition intact, ext ear canals clear,  Neck: Supple without JVD or lymphadenopathy Heart: Reg rate and rhythm. No murmurs rubs or gallops Chest: CTA bilaterally without wheezes, rales, or rhonchi; no distress Abdomen: Soft, non-tender, non-distended, bowel sounds positive. Extremities: No clubbing, cyanosis, or edema. Pulses are 2+ Skin: No evidence of breakdown, no evidence of rash Neurologic: Cranial nerves II through XII intact, motor strength is 5/5 in bilateral deltoid, bicep, tricep, grip, hip flexor, knee extensors,0/5  ankle dorsiflexor and plantar flexor Sensory examabsent sensation to light touch and proprioception right foot , absent LT left foot reduced proprio  Musculoskeletal: Full range of motion in all 4 extremities. No joint swelling  Assessment/Plan: 1. Functional deficits secondary to Critical illness polyneuropathy  which require 3+ hours per day of interdisciplinary therapy in a  comprehensive inpatient rehab setting.  Physiatrist is providing close team supervision and 24 hour management of active medical problems listed below.  Physiatrist and rehab team continue to assess barriers to discharge/monitor patient progress toward functional and medical goals  Care Tool:  Bathing    Body parts bathed by patient: Right arm, Left arm, Chest, Abdomen, Front perineal area, Right upper leg, Left upper leg, Face, Right lower leg, Left lower leg   Body parts bathed by helper: Buttocks     Bathing assist Assist Level: Minimal Assistance - Patient > 75%     Upper Body Dressing/Undressing Upper body dressing   What is the patient wearing?: Pull over shirt    Upper body assist Assist Level: Set up assist    Lower Body Dressing/Undressing Lower body dressing      What is the patient wearing?: Pants     Lower body assist Assist for lower body dressing: Supervision/Verbal cueing     Toileting Toileting    Toileting assist Assist for toileting: Minimal Assistance - Patient > 75% Assistive Device Comment: to bathroom   Transfers Chair/bed transfer  Transfers assist     Chair/bed transfer assist level: Contact Guard/Touching assist Chair/bed transfer assistive device: Geologist, engineering   Ambulation assist   Ambulation activity did not occur: Safety/medical concerns (required use of RW (does not use AD baseline))  Assist level: 2 helpers (CGA & +2 for O2 line management & w/c follow) Assistive device: Walker-rolling Max distance: 156ft   Walk 10 feet activity   Assist  Walk 10  feet activity did not occur: Safety/medical concerns  Assist level: 2 helpers (CGA & +2 for O2 line management & w/c follow) Assistive device: Walker-rolling   Walk 50 feet activity   Assist Walk 50 feet with 2 turns activity did not occur: Safety/medical concerns  Assist level: 2 helpers (CGA & +2 for O2 line management & w/c follow) Assistive device:  Walker-rolling    Walk 150 feet activity   Assist Walk 150 feet activity did not occur: Safety/medical concerns         Walk 10 feet on uneven surface  activity   Assist Walk 10 feet on uneven surfaces activity did not occur: Safety/medical concerns         Wheelchair     Assist Will patient use wheelchair at discharge?:  (TBD but do not anticipate)             Wheelchair 50 feet with 2 turns activity    Assist            Wheelchair 150 feet activity     Assist          Blood pressure 127/75, pulse 93, temperature 99 F (37.2 C), resp. rate 18, weight (!) 155.6 kg, SpO2 100 %.  Medical Problem List and Plan: 1.   Reduced mobility and self-care skills secondary to critical illness polyneuropathy -patient may not shower? Since trach is out, would think pt can shower -ELOS/Goals: 7 to 10 days  -Continue CIR 2. Antithrombotics: -DVT/anticoagulation:Pharmaceutical:Lovenox80mg  QD per Pharm -antiplatelet therapy: N/A 3. Pain Management:ON oxycodone 10 mg every 4 hours--denies any issues with pain. Attempt to start wean over next few days. Increased  Gabapentin 400mg   TID  for neuropathy.no better ,  increase to 600mg    6/26- added Duloxetine 30 mg daily x 6 days then 60 mg daily- for nerve pain  6/27- tolerating well  6/28: Cymbalta well tolerated.  4. Mood:LCSW to follow for evaluation and support. -antipsychotic agents: N/A 5. Neuropsych: This patientiscapable of making decisions onhisown behalf. 6.Sacral decub/Skin/Wound Care:Hydrotherapy to sacral wound 5 X week with santyl wet to dry dressing changes. Continue Zinc and vitamin C. Will add protein supplement to promote healing. 7. Fluids/Electrolytes/Nutrition:Monitor I/O. Check lytes in am.  8. HTN: Monitor BP tid--controlled without meds. Vitals:   01/19/20 2315 01/20/20 0440  BP:  127/75  Pulse: 98 93  Resp: 18 18   Temp:  99 F (37.2 C)  SpO2: 95% 100%   6/27- occ tachycardia- not all the time- BP just barely elevated- in last 24 hours- con't regimen  6/28: well controlled.  9. Covid PNA with hypoxemic respiratory failure:Keep HOB >35 degrees at nights.On 5 L oxygen per ATC.- also with chronic sleep apnea, will clarify plans for decannulation vs home with trach , pulm following   6/27- pt decannulated SELF 6/25! 10. New diagnosis T2DM: Hgb A1C-6.6. Will transition from levemir to oral hypoglycemic regimen. Change diet to CM. Continue to monitor BS ac/hs and titrate medication as indicated.  CBG (last 3)  Recent Labs    01/19/20 1659 01/19/20 2118 01/20/20 0617  GLUCAP 95 96 152*   6/27- BGs well controlled- thought about d/c of metformin, however metformin can also help with weight loss- might continue.  11. Anemia of critical illness: Is improving from drop to 7.7-->9.6.  6/28: Hgb up to 10.4 12. Morbid obesity- BMI 54.5: Educated on weight loss and healthy diet. 13. OSA: Has had study years ago but could not afford CPAP per mother. Will  need sleep study after discharge. 14. Resting Tachycardia: Likely due to debility--HR in 110-120.If BP is up can add BB  Monitor triggering and keeping pt awake, will ask nsg to change setting , looks like monitor triggering at 100bpm, should move up to 110   6/26- will either change monitor setting or stop- since O2 sats look great- >93% in last 24+ hours.  6/27- slept better- vitals look great    LOS: 7 days A FACE TO FACE EVALUATION WAS PERFORMED  Martha Clan P Earl Losee 01/20/2020, 10:25 AM

## 2020-01-20 NOTE — Progress Notes (Signed)
Patient ID: Clifford Nichols, male   DOB: September 22, 1980, 39 y.o.   MRN: [id number]   Patient family education set for Thursday 9AM

## 2020-01-20 NOTE — Progress Notes (Signed)
NAME:  VIVAN AGOSTINO, MRN:  [id number], DOB:  [birth year], LOS: 7 [facility] ADMISSION DATE:  01/13/2020, [facility] Admission DATE:  12/04/19 REFERRING MD:  Dr Mortimer Fries, CCM, CHIEF COMPLAINT:  Acute respiratory failure   Brief History   39 year old obese man with hypertension, diabetes admitted with acute respiratory failure, ARDS from COVID-19 pneumonia, initial positive test 5/10  Past Medical History    has a past medical history of Abscess of upper back excluding scapular region, Diabetes mellitus without complication ([place]), GERD (gastroesophageal reflux disease), Hypertension, and Sebaceous cyst.  Significant Hospital Events   5/10 admitted [facility]  5/11 failed HHFNC, BiPAP.  Required intubation 5/12 transferred to [facility], evaluated by opthal for proptosis- no intervention required 5/17 Started paralytics, ECMO team consulted Not a candidate 5/19 Started proning 5/23 Off pressors 5/26 Proning stopped, continue paralytics 5/27 New tongue wound 2/2 bite block in place, weaned off ketamine. Daily fevers 5/28 Trach, stop paralytics 6/1 Out of airborne isolation, family visiting, on PSV weans 6/6 Tolerated ATC yesterday for 12 hrs 6/7 remained on ATC 6/11 remains on ATC  Consults:  Ophthalmology for proptosis 5/12  Procedures:  ETT 5/11 >> 5/28 L femoral CVC 5/11 >> 5/17 R radial art line 5/17 >> 5/28 L IJ CVC 5/17 >> 6/1  Trach 5/28  Significant Diagnostic Tests:  Head CT 5/11 >> no significant intracranial abnormality.  Scattered opacification and air-fluid levels in the frontal, ethmoid, sphenoid and maxillary sinuses of unclear significance.  Head CT 5/12 >> no significant intracranial abnormality  Lower extremity Doppler 5/14 >> negative for DVT  Micro Data:  SARS CoV2 5/10 >> positive MRSA screen 5/12 >> negative Respiratory 5/12 >> negative Blood 5/17 >> Staph epi and Staph hominis Respiratory 5/17 >> Staph epi 5/24 resp cx bal: staph epi 5/24 BAL acid fast: smear negative cx  pending 5/24 BAL fungal:pending 5/26 blood: No growth BAL 5/28-Acinetobacter, Proteus, Pseudomonas, Enterococcus Trach asp 6/1- Pseudomonas  Antimicrobials:  Remdesivir 5/11 >> 5/15 Dexamethasone 5/11 >> 5/20 Tocilizumab 5/12 Cefepime 5/17 >> 5/21 Linezolid 5/17 >> 5/23 Zosyn 5/21 >>  5/29 Vanco 5/28 >> 6/1  Scheduled Meds: . vitamin C  500 mg Oral Daily  . DULoxetine  30 mg Oral Daily   Followed by  . [START ON 01/24/2020] DULoxetine  60 mg Oral Daily  . enoxaparin (LOVENOX) injection  80 mg Subcutaneous Q24H  . gabapentin  600 mg Oral TID  . insulin aspart  0-15 Units Subcutaneous TID WC  . insulin aspart  0-5 Units Subcutaneous QHS  . melatonin  3 mg Oral QHS  . metFORMIN  500 mg Oral Q breakfast  . multivitamin with minerals  1 tablet Oral Daily  . nutrition supplement (JUVEN)  1 packet Oral BID BM  . oxyCODONE  10 mg Oral Q4H  . potassium chloride  20 mEq Oral Daily  . Ensure Max Protein  11 oz Oral BID  . zinc sulfate  220 mg Oral Daily   Continuous Infusions:  PRN Meds:.acetaminophen, alum & mag hydroxide-simeth, bisacodyl, diphenhydrAMINE, guaiFENesin, guaiFENesin-dextromethorphan, lip balm, polyethylene glycol, prochlorperazine **OR** prochlorperazine **OR** prochlorperazine, sodium phosphate, traZODone, zolpidem   Interim history/subjective:  Self-decannulated  6/25. Slept with CPAP last night w/o issue. Denies complaints. No significant sputum production.  Objective   Blood pressure (!) 124/58, pulse 96, temperature 98.9 F (37.2 C), temperature source Oral, resp. rate 18, weight (!) 155.6 kg, SpO2 100 %.        Intake/Output Summary (Last 24 hours) at 01/20/2020 2120 Last data  filed at 01/20/2020 1900 Gross per 24 hour  Intake 594 ml  Output 300 ml  Net 294 ml   Filed Weights   01/18/20 0616 01/19/20 0401 01/20/20 0508  Weight: (!) 157.9 kg (!) 158 kg (!) 155.6 kg    Examination: Elderly gentleman, does not appear to be in distress Neck is supple no  JVD no thyromegaly no adenopathy  Bandage over trach site- no bleeding but mild mucus trapped over opening. Normal voice.  Breathing comfortably on RA, speaking in full sentences. RRR Answering questions appropriately, moving extremities   Resolved Hospital Problem list    Left proptosis, noted with coughing early 5/12, resolved.  Apparently per wife he has episodic intermittent proptosis at home as well. Head CT 5/12 reassuring Ophthalmology did evaluate, recommended that he will need outpatient evaluation but no intervention at this time beyond keeping the eyes moist.  Septic shock, present on admission. Tongue necrosis secondary to biting down Hypertriglyceridemia Acinetobacter HAP Acute toxic metabolic encephalopathy    Assessment & Plan:   Severe ARDS due to COVID-19 pneumonia  S/p trach --> decannulated by self on 6/25 Morbid obesity,  OSA with pre admit hypersomnolence resolved with trach  Multiple pneumonias in the past Deconditioning DM 2 Stage III pressure ulcer Insomnia  -must have CPAP whenever sleeping, including naps -requires tight occlusive dressing over trach site   Will need a sleep study to be able to get a CPAP to be used nocturnally   Audryna Wendt A Landen Breeland, DO 01/20/20 9:20 PM [facility]

## 2020-01-20 NOTE — Progress Notes (Signed)
Occupational Therapy Session Note  Patient Details  Name: Clifford Nichols MRN: [id number] Date of Birth: 07/20/1981  Today's Date: 01/20/2020 OT Individual Time: 7680-8811 OT Individual Time Calculation (min): 58 min   Skilled Therapeutic Interventions/Progress Updates:    Pt greeted in bed, premedicated for neuropathic pain in feet. Wanting to engage in bathing/dressing tasks sit<stand from EOB using bariatric RW for standing support during tx. Pt required setup for UB self care tasks as well as oral care/grooming tasks, Min A for perihygiene in the back due to sacral wound. He used the [facility] sponge for reaching feet and AE for donning footwear as needed. OT provided him with a LH mirror and we discussed the importance of daily foot inspection during ADL routine as he states he has absent sensation in certain areas of his feet. Education provided in regards to infection risk if he sustains a cut or other injury to feet that he is unable to feel. Pt returned to bed at end of session, left with all needs within reach and lavender cotton ball for pillowcase to promote relaxation during rest.   Pt with no s/s SOB during tx, sats assessed after ADL with reading of 98% on RA, HR 109-113bpm.   Therapy Documentation Precautions:  Precautions Precautions: Fall Precaution Comments: monitor O2 and HR Restrictions Weight Bearing Restrictions: No Pain: Pain Assessment Pain Scale: 0-10 Pain Score: 0-No pain ADL: ADL Eating: Independent Where Assessed-Eating: Wheelchair Grooming: Setup Where Assessed-Grooming: Wheelchair Upper Body Bathing: Setup Where Assessed-Upper Body Bathing: Edge of bed Lower Body Bathing: Moderate assistance Where Assessed-Lower Body Bathing: Edge of bed Upper Body Dressing: Setup Where Assessed-Upper Body Dressing: Edge of bed Lower Body Dressing: Maximal assistance Where Assessed-Lower Body Dressing: Edge of bed Toileting: Maximal assistance Where Assessed-Toileting:  Bed level Toilet Transfer: Minimal assistance Toilet Transfer Method: Proofreader: Grab bars, Raised toilet seat      Therapy/Group: Individual Therapy  Vivi Piccirilli A Decklyn Hyder 01/20/2020, 12:15 PM

## 2020-01-20 NOTE — Progress Notes (Signed)
Occupational Therapy Session Note  Patient Details  Name: Clifford Nichols MRN: [id number] Date of Birth: [birth year]  Today's Date: 01/20/2020 OT Individual Time: 1855-0158 OT Individual Time Calculation (min): 44 min   Short Term Goals: Week 1:  OT Short Term Goal 1 (Week 1): Pt will complete LB bathing sit to stand with min assist using AE PRN. OT Short Term Goal 2 (Week 1): Pt will complete LB dressing sit to stand with supervision using AE PRN. OT Short Term Goal 3 (Week 1): Pt will maintain standing at the sink during grooming tasks for at least 3 mins with close supervision in order to increase overall endurance. OT Short Term Goal 4 (Week 1): Pt will complete toilet transfers with use of the RW and close supervision to elevated toilet.  Skilled Therapeutic Interventions/Progress Updates:    Pt greeted semi-reclined in bed and agreeable to OT treatment session. Pt came to sitting EOB mod I. He was already dressed, with socks, shoes, and B AFOs already on. Pt ambulated 10 feet in room to wc with bariatric RW and CGA. Worked on UB there-ex with UB ergometer on level 4. Pt completed 2 sets of 5, forward and backwards. Pt completed stand-pivot to therapy mat with CGA. UB there-ex with ball toss. Incorporated trunk strengthening with oblique twist upon catching the ball. Pt returned to room at end of session and completed stand-pivot back to bed with supervision. Pt left semi-reclined in bed with needs met.   Therapy Documentation Precautions:  Precautions Precautions: Fall Precaution Comments: monitor O2 and HR Restrictions Weight Bearing Restrictions: No Pain:  denies pain  Therapy/Group: Individual Therapy  Valma Cava 01/20/2020, 2:35 PM

## 2020-01-20 NOTE — Consult Note (Signed)
WOC Nurse wound follow up Patient receiving care in [facility] 4M12.  Patient is on a low air loss mattress, and turns himself into a prone position frequently. Wound type: stage 3 sacral/coccyx wound Measurement: 6.3 cm x 2.5 cm x 2.1 cm Wound bed: 100% beefy red granulation tissue Drainage (amount, consistency, odor) none Periwound: intact Dressing procedure/placement/frequency: Will change topical therapy to Xeroform gauze into wound bed, top with foam dressing change daily. Monitor the wound area(s) for worsening of condition such as: Signs/symptoms of infection,  Increase in size,  Development of or worsening of odor, Development of pain, or increased pain at the affected locations.  Notify the medical team if any of these develop.  Thank you for the consult.  Discussed plan of care with the patient.  WOC nurse will not follow at this time.  Please re-consult the WOC team if needed.  Helmut Muster, RN, MSN, CWOCN, CNS-BC, pager [phone]

## 2020-01-20 NOTE — Progress Notes (Signed)
Occupational Therapy Session Note  Patient Details  Name: Clifford Nichols MRN: [id number] Date of Birth: [birth year]  Today's Date: 01/20/2020 OT Individual Time: 0814-4818 OT Individual Time Calculation (min): 54 min    Short Term Goals: Week 1:  OT Short Term Goal 1 (Week 1): Pt will complete LB bathing sit to stand with min assist using AE PRN. OT Short Term Goal 2 (Week 1): Pt will complete LB dressing sit to stand with supervision using AE PRN. OT Short Term Goal 3 (Week 1): Pt will maintain standing at the sink during grooming tasks for at least 3 mins with close supervision in order to increase overall endurance. OT Short Term Goal 4 (Week 1): Pt will complete toilet transfers with use of the RW and close supervision to elevated toilet.  Skilled Therapeutic Interventions/Progress Updates:    Upon entering the room, pt supine in bed with no c/o pain. Pt agreeable to OT intervention. Pt was able to show therapist B UE strengthening exercises from previous sessions with therapist encouraging pt to engaged in these exercises on his own. Pt verbalized understanding. Pt required assistance to don B shoes with AFO's. Pt ambulating 44' with RW and CGA to tub shower room. OT confirmed with pt that at this time due to sacral wound he was not cleared to shower but when is cleared by MD OT recommended TTB based on his home set up as well as mother's home set up. Pt demonstrated transfer onto TTB with CGA. OT recommended pt shower at end of day and ambulating into bathroom with RW and doff clothing items seated on edge of TTB for safety. Pt verbalized understanding and agreed that when able this would be safest option. OT printed off information about TTB and provided it to pt to order on his own when needed. Pt returning back to room in same manner as above. OT began energy conservation education as well with paper handout provided. Education to continue. Pt remained seated on EOB with call bell and all  needed items within reach.   Therapy Documentation Precautions:  Precautions Precautions: Fall Precaution Comments: monitor O2 and HR Restrictions Weight Bearing Restrictions: No ADL: ADL Eating: Independent Where Assessed-Eating: Wheelchair Grooming: Setup Where Assessed-Grooming: Wheelchair Upper Body Bathing: Setup Where Assessed-Upper Body Bathing: Edge of bed Lower Body Bathing: Moderate assistance Where Assessed-Lower Body Bathing: Edge of bed Upper Body Dressing: Setup Where Assessed-Upper Body Dressing: Edge of bed Lower Body Dressing: Maximal assistance Where Assessed-Lower Body Dressing: Edge of bed Toileting: Maximal assistance Where Assessed-Toileting: Bed level Toilet Transfer: Minimal assistance Toilet Transfer Method: Proofreader: Grab bars, Raised toilet seat   Therapy/Group: Individual Therapy  Alen Bleacher 01/20/2020, 2:28 PM

## 2020-01-21 ENCOUNTER — Inpatient Hospital Stay (HOSPITAL_COMMUNITY): Payer: BLUE CROSS/BLUE SHIELD | Admitting: Physical Therapy

## 2020-01-21 ENCOUNTER — Inpatient Hospital Stay (HOSPITAL_COMMUNITY): Payer: BLUE CROSS/BLUE SHIELD | Admitting: Occupational Therapy

## 2020-01-21 LAB — GLUCOSE, CAPILLARY
Glucose-Capillary: 102 mg/dL — ABNORMAL HIGH (ref 70–99)
Glucose-Capillary: 95 mg/dL (ref 70–99)
Glucose-Capillary: 98 mg/dL (ref 70–99)
Glucose-Capillary: 112 mg/dL — ABNORMAL HIGH (ref 70–99)

## 2020-01-21 MED ORDER — ENSURE MAX PROTEIN PO LIQD
11.0000 [oz_av] | Freq: Every day | ORAL | Status: DC
  Administered 2020-01-22: 11 [oz_av] via ORAL
  Filled 2020-01-21 (×3): qty 330

## 2020-01-21 NOTE — Plan of Care (Signed)
  Problem: RH Stairs Goal: LTG Patient will ambulate up and down stairs w/assist (PT) Description: LTG: Patient will ambulate up and down # of stairs with assistance (PT) Flowsheets Taken 01/21/2020 1733 LTG: Pt will  ambulate up and down number of stairs: (updated based on new home D/C location) 6steps using B HRs (updated home set-up) Taken 01/14/2020 1905 LTG: Pt will ambulate up/down stairs assist needed:: Contact Guard/Touching assist Note: updated based on new home D/C location

## 2020-01-21 NOTE — Progress Notes (Signed)
[facility] PHYSICAL MEDICINE & REHABILITATION PROGRESS NOTE   Subjective/Complaints: Continues to have burning in feet. Cymbalta is helping. Discussed that dose will be increased after 6 days. Denies constipation, insomnia, other complaints.   ROS - Pt denies SOB, abd pain, CP, N/V/C/D, and vision changes   Objective:   No results found. Recent Labs    01/20/20 0456  WBC 6.3  HGB 10.4*  HCT 35.2*  PLT 182   Recent Labs    01/20/20 0456  NA 137  K 4.3  CL 102  CO2 24  GLUCOSE 106*  BUN 9  CREATININE 0.87  CALCIUM 9.0    Intake/Output Summary (Last 24 hours) at 01/21/2020 0958 Last data filed at 01/20/2020 1900 Gross per 24 hour  Intake 236 ml  Output --  Net 236 ml     Physical Exam: Vital Signs Blood pressure 117/75, pulse 88, temperature 98.7 F (37.1 C), resp. rate 20, weight (!) 155.4 kg, SpO2 98 %. General: Alert and oriented x 3, No apparent distress HEENT: Head is normocephalic, atraumatic, PERRLA, EOMI, sclera anicteric, oral mucosa pink and moist, dentition intact, ext ear canals clear,  Neck: Supple without JVD or lymphadenopathy Heart: Reg rate and rhythm. No murmurs rubs or gallops Chest: CTA bilaterally without wheezes, rales, or rhonchi; no distress Abdomen: Soft, non-tender, non-distended, bowel sounds positive. Extremities: No clubbing, cyanosis, or edema. Pulses are 2+ Skin: No evidence of breakdown, no evidence of rash Neurologic: Cranial nerves II through XII intact, motor strength is 5/5 in bilateral deltoid, bicep, tricep, grip, hip flexor, knee extensors,0/5  ankle dorsiflexor and plantar flexor Sensory examabsent sensation to light touch and proprioception right foot , absent LT left foot reduced proprio  Musculoskeletal: Full range of motion in all 4 extremities. No joint swelling  Assessment/Plan: 1. Functional deficits secondary to Critical illness polyneuropathy  which require 3+ hours per day of interdisciplinary therapy in a  comprehensive inpatient rehab setting.  Physiatrist is providing close team supervision and 24 hour management of active medical problems listed below.  Physiatrist and rehab team continue to assess barriers to discharge/monitor patient progress toward functional and medical goals  Care Tool:  Bathing    Body parts bathed by patient: Right arm, Left arm, Chest, Abdomen, Front perineal area, Right upper leg, Left upper leg, Face, Right lower leg, Left lower leg   Body parts bathed by helper: Buttocks     Bathing assist Assist Level: Minimal Assistance - Patient > 75%     Upper Body Dressing/Undressing Upper body dressing   What is the patient wearing?: Pull over shirt    Upper body assist Assist Level: Set up assist    Lower Body Dressing/Undressing Lower body dressing      What is the patient wearing?: Pants     Lower body assist Assist for lower body dressing: Supervision/Verbal cueing     Toileting Toileting    Toileting assist Assist for toileting: Minimal Assistance - Patient > 75% Assistive Device Comment: to bathroom   Transfers Chair/bed transfer  Transfers assist     Chair/bed transfer assist level: Contact Guard/Touching assist Chair/bed transfer assistive device: Geologist, engineering   Ambulation assist   Ambulation activity did not occur: Safety/medical concerns (required use of RW (does not use AD baseline))  Assist level: Contact Guard/Touching assist Assistive device: Walker-rolling Max distance: 75'   Walk 10 feet activity   Assist  Walk 10 feet activity did not occur: Safety/medical concerns  Assist level: Contact  Guard/Touching assist Assistive device: Walker-rolling   Walk 50 feet activity   Assist Walk 50 feet with 2 turns activity did not occur: Safety/medical concerns  Assist level: Supervision/Verbal cueing Assistive device: Walker-rolling    Walk 150 feet activity   Assist Walk 150 feet activity did not  occur: Safety/medical concerns         Walk 10 feet on uneven surface  activity   Assist Walk 10 feet on uneven surfaces activity did not occur: Safety/medical concerns         Wheelchair     Assist Will patient use wheelchair at discharge?:  (TBD but do not anticipate)             Wheelchair 50 feet with 2 turns activity    Assist            Wheelchair 150 feet activity     Assist          Blood pressure 117/75, pulse 88, temperature 98.7 F (37.1 C), resp. rate 20, weight (!) 155.4 kg, SpO2 98 %.  Medical Problem List and Plan: 1.   Reduced mobility and self-care skills secondary to critical illness polyneuropathy -patient may not shower? Since trach is out, would think pt can shower -ELOS/Goals: 7 to 10 days  -Continue CIR 2. Antithrombotics: -DVT/anticoagulation:Pharmaceutical:Lovenox80mg  QD per Pharm -antiplatelet therapy: N/A 3. Pain Management:ON oxycodone 10 mg every 4 hours--denies any issues with pain. Attempt to start wean over next few days. Increased  Gabapentin 400mg   TID  for neuropathy.no better ,  increase to 600mg    6/26- added Duloxetine 30 mg daily x 6 days then 60 mg daily- for nerve pain  6/29: tolerating Cymbalta well. Discussed plan to increase after 6 days. 4. Mood:LCSW to follow for evaluation and support. -antipsychotic agents: N/A 5. Neuropsych: This patientiscapable of making decisions onhisown behalf. 6.Sacral decub/Skin/Wound Care:Hydrotherapy to sacral wound 5 X week with santyl wet to dry dressing changes. Continue Zinc and vitamin C. Will add protein supplement to promote healing. 7. Fluids/Electrolytes/Nutrition:Monitor I/O. Check lytes in am.  8. HTN: Monitor BP tid--controlled without meds. Vitals:   01/21/20 0040 01/21/20 0452  BP:  117/75  Pulse: 99 88  Resp: 18 20  Temp:  98.7 F (37.1 C)  SpO2: 99% 98%   6/27- occ tachycardia- not  all the time- BP just barely elevated- in last 24 hours- con't regimen  6/29: well controlled.  9. Covid PNA with hypoxemic respiratory failure:Keep HOB >35 degrees at nights.On 5 L oxygen per ATC.- also with chronic sleep apnea, will clarify plans for decannulation vs home with trach , pulm following   6/27- pt decannulated SELF 6/25! 10. New diagnosis T2DM: Hgb A1C-6.6. Will transition from levemir to oral hypoglycemic regimen. Change diet to CM. Continue to monitor BS ac/hs and titrate medication as indicated.  CBG (last 3)  Recent Labs    01/20/20 1647 01/20/20 2124 01/21/20 0604  GLUCAP 82 166* 102*   6/27- BGs well controlled- thought about d/c of metformin, however metformin can also help with weight loss- might continue.   6/29: well controlled.  11. Anemia of critical illness: Is improving from drop to 7.7-->9.6.  6/28: Hgb up to 10.4 12. Morbid obesity- BMI 54.5: Educated on weight loss and healthy diet. 13. OSA: Has had study years ago but could not afford CPAP per mother. Will need sleep study after discharge. 14. Resting Tachycardia: Likely due to debility--HR in 110-120.If BP is up can add BB  Monitor triggering  and keeping pt awake, will ask nsg to change setting , looks like monitor triggering at 100bpm, should move up to 110   6/26- will either change monitor setting or stop- since O2 sats look great- >93% in last 24+ hours.  6/27- slept better- vitals look great    LOS: 8 days A FACE TO FACE EVALUATION WAS PERFORMED  Clint Bolder P Bailie Christenbury 01/21/2020, 9:58 AM

## 2020-01-21 NOTE — Plan of Care (Signed)
  Problem: Education: Goal: Knowledge about tracheostomy care/management will improve Outcome: Progressing   Problem: Activity: Goal: Ability to tolerate increased activity will improve Outcome: Progressing   Problem: Respiratory: Goal: Patent airway maintenance will improve Outcome: Progressing   Problem: Role Relationship: Goal: Ability to communicate will improve Outcome: Progressing   Problem: RH SKIN INTEGRITY Goal: RH STG SKIN FREE OF INFECTION/BREAKDOWN Outcome: Progressing Goal: RH STG MAINTAIN SKIN INTEGRITY WITH ASSISTANCE Description: STG Maintain Skin Integrity With min Assistance. Outcome: Progressing Goal: RH STG ABLE TO PERFORM INCISION/WOUND CARE W/ASSISTANCE Description: STG Able To Perform Incision/Wound Care With min Assistance. Outcome: Progressing   Problem: RH SAFETY Goal: RH STG ADHERE TO SAFETY PRECAUTIONS W/ASSISTANCE/DEVICE Description: STG Adhere to Safety Precautions With Mod I  Assistance/Device. Outcome: Progressing   Problem: RH PAIN MANAGEMENT Goal: RH STG PAIN MANAGED AT OR BELOW PT'S PAIN GOAL Description: Pain goal less than 5 Outcome: Progressing   Problem: RH KNOWLEDGE DEFICIT GENERAL Goal: RH STG INCREASE KNOWLEDGE OF SELF CARE AFTER HOSPITALIZATION Description: Patient will be able to direct care at discharge using educational resources independently Outcome: Progressing   Problem: Consults Goal: RH GENERAL PATIENT EDUCATION Description: See Patient Education module for education specifics. Outcome: Progressing

## 2020-01-21 NOTE — Progress Notes (Signed)
Occupational Therapy Session Note  Patient Details  Name: Clifford Nichols MRN: [id number] Date of Birth: 1981-01-17  Today's Date: 01/21/2020 OT Individual Time: 1000-1115   And   1430-1525 OT Individual Time Calculation (min): 75 min   And  55 min   Short Term Goals: Week 1:  OT Short Term Goal 1 (Week 1): Pt will complete LB bathing sit to stand with min assist using AE PRN. OT Short Term Goal 2 (Week 1): Pt will complete LB dressing sit to stand with supervision using AE PRN. OT Short Term Goal 3 (Week 1): Pt will maintain standing at the sink during grooming tasks for at least 3 mins with close supervision in order to increase overall endurance. OT Short Term Goal 4 (Week 1): Pt will complete toilet transfers with use of the RW and close supervision to elevated toilet.  Skilled Therapeutic Interventions/Progress Updates:    AM session:   Patient in bed, alert and ready for therapy session.  He denies pain at this time and requests adl.  Bed mobility completed with CS.  He tolerates unsupported sitting for bathing and dressing mod I.  Bathing completed with set up for all aspects with exception of buttocks and thoroughness for lower legs/feet.  UB dressing set up, LB dressing set up, slipper socks set up using dressing stick and sock aide.  Grooming tasks completed with set up.  Completed standing trunk/balance and core mobility exercises with CS/CGA.  Completed seated UB conditioning activities with focus on left scapular stability and OH reach.  He remained seated edge of bed at close of session with call bell and tray table in reach.     PM session:   Patient in bed, alert and ready for afternoon session.  He denies pain.  Supine to sitting with CS.  SPT bed to w/c with CS/CGA.  SPT to//from nustep CGA - completed 10 minutes + 5 minutes.  Reviewed s/s of DVT - practiced calf stretch and hamstring stretch.   Completed seated theraband exercises with focus on proximal strength and  coordination.   Good tolerance overall - ongoing left shoulder weakness/fatigue.  Returned to sitting edge of bed at close of session with CS.  Call bell and tray table in reach.     Therapy Documentation Precautions:  Precautions Precautions: Fall Precaution Comments: monitor O2 and HR Restrictions Weight Bearing Restrictions: No   Therapy/Group: Individual Therapy  Barrie Lyme 01/21/2020, 7:39 AM

## 2020-01-21 NOTE — Progress Notes (Signed)
Physical Therapy Weekly Progress Note  Patient Details  Name: Clifford Nichols MRN: [id number] Date of Birth: [birth year]  Beginning of progress report period: January 14, 2020 End of progress report period: January 21, 2020  Today's Date: 01/21/2020 PT Individual Time: 1307-1406 PT Individual Time Calculation (min): 59 min   Patient has met 3 of 3 short term goals.  Clifford Nichols is progressing well with therapy demonstrating improving overall cardiovascular endurance, LE functional strength, and progression with mobility. He is performing bed mobility mod-I, sit<>stands and stand pivot transfers using RW with CGA/supervision, gait up to 221f using RW with CGA, and ascending/descending 8 steps using B HRs with min assist. He has bilateral ottobock walk-on AFOs to improve gait mechanics and balance while standing due to bilateral ankle DF/PF paralysis. He has progressed to being on RA during therapy sessions with SpO2 maintaining >90%. He continues to demonstrate impaired B LE strength, standing balance, and endurance.  Patient continues to demonstrate the following deficits muscle weakness and muscle paralysis, decreased cardiorespiratoy endurance and decreased oxygen support, abnormal tone and unbalanced muscle activation and decreased standing balance, decreased postural control and decreased balance strategies and therefore will continue to benefit from skilled PT intervention to increase functional independence with mobility.  Patient progressing toward long term goals..  Continue plan of care.  PT Short Term Goals Week 1:  PT Short Term Goal 1 (Week 1): Pt will perform sit<>stand and stand pivot transfers using LRAD with CGA PT Short Term Goal 1 - Progress (Week 1): Met PT Short Term Goal 2 (Week 1): Pt will ambulate at least 1065fusing LRAD with CGA PT Short Term Goal 2 - Progress (Week 1): Met PT Short Term Goal 3 (Week 1): Pt will ascend/descend 8 steps using B HRs with mod assist PT Short Term  Goal 3 - Progress (Week 1): Met Week 2:  PT Short Term Goal 1 (Week 2): = to LTGs based on ELOS  Skilled Therapeutic Interventions/Progress Updates:  Ambulation/gait training;Community reintegration;DME/adaptive equipment instruction;Neuromuscular re-education;Psychosocial support;Stair training;UE/LE Strength taining/ROM;Wheelchair propulsion/positioning;Balance/vestibular training;Discharge planning;Pain management;Functional electrical stimulation;Skin care/wound management;Therapeutic Activities;UE/LE Coordination activities;Cognitive remediation/compensation;Disease management/prevention;Functional mobility training;Patient/family education;Splinting/orthotics;Therapeutic Exercise;Visual/perceptual remediation/compensation   Pt received sitting EOB and agreeable to therapy session. Pt received and maintained on RA throughout session - SpO2 97% after ambulating 22771fDonned B LE AFOs and shoes max assist. Sit<>stands using RW with CGA/supervision for safety throughout session. Gait ~5ft16f w/c using RW with CGA.  Transported to/from gym in w/c for time management and energy conservation. Gait training [id number] RW with CGA for steadying, required 2 standing rest breaks due to LE fatigue - demonstrates improved gait mechanics while wearing AFOs though does have quick plantarflexion after mid-foot/heel strike on initial contact due to impaired ankle PF/DF strength - SpO2 97% on RA and HR 121bpm decreasing to 111bpm after seated rest break. Gait ~130ft 64fg RW with CGA - same as above. Ascended/descended 4 steps x2 using B HRs with CGA/min assist for safety - ascends forward step-to pattern leading with L LE, descends backwards step-to pattern leading with R LE due to AFOs and increased safety due to LE strength impairments. R LE strengthening via repeated step-ups on 1st step using B HRs x6 reps with CGA/min assist for balance.   Provided patient the following exercises on printed HEP and educated pt  on safe set-up in home with family present while performing exercises: * - standing marches x1 minutes * - repeated sit<>stands using RW x8 reps - lateral side  stepping at counter x3 reps - standing hamstring curls x10 reps - walking in home using RW with family 3x/daily  Pt performed the * exercises during session and demonstrated understanding. Therapist educated pt on recommendation for follow-up OPPT and recommended DME of bari-RW - discussed ensuring he plans ahead for going into the community for therapy appointments to ensure he has energy to ambulate distance to get to/from the car. Transported back to room in w/c. Stand pivot w/c>EOB using RW with close supervision. Max assist to doff shoes and AFOs - pt left sitting EOB with needs in reach.   Therapy Documentation Precautions:  Precautions Precautions: Fall Precaution Comments: monitor O2 and HR Restrictions Weight Bearing Restrictions: No  Pain:   No reports of pain throughout session.  Therapy/Group: Individual Therapy  Tawana Scale, PT, DPT 01/21/2020, 1:02 PM

## 2020-01-21 NOTE — Progress Notes (Signed)
Nutrition Follow-up  DOCUMENTATION CODES:   Morbid obesity  INTERVENTION:   - Ensure Max po daily, each supplement provides 150 kcal and 30 grams of protein  - Magic Cup TID with meals, each supplement provides 290 kcal and 9 grams of protein  -1 packet Juven BID, each packet provides 95 calories, 2.5 grams of protein, and 9.8 grams of carbohydrate; also contains 7 grams of L-arginine and L-glutamine, 300 mg vitamin C, 15 mg vitamin E, 1.2 mcg vitamin B-12, 9.5 mg zinc, 200 mg calcium, and 1.5 g calcium Beta-hydroxy-Beta-methylbutyrate to support wound healing  - MVI with minerals daily  NUTRITION DIAGNOSIS:   Increased nutrient needs related to wound healing as evidenced by estimated needs.  Ongoing  GOAL:   Patient will meet greater than or equal to 90% of their needs  Progressing  MONITOR:   PO intake, Supplement acceptance, Labs, Weight trends, Skin, I & O's  REASON FOR ASSESSMENT:   Malnutrition Screening Tool    ASSESSMENT:   39 year old male with history of HTN, untreated OSA, morbid obesity who was admitted on 12/02/19 with acute respiratory failure and ARDS from COVID-19 PNA. He required intubation. Pt required tracheostomy 5/28 and extubated to ATC. Pt required Cortrak tube for enteral nutrition which has since been removed. Diet has been advanced to regular with thin liquids and pt is tolerating this without difficulty. Admitted to CIR on 6/21.  6/25 - self-decannulated  Noted target d/c date of 01/28/20. Pt using CPAP whenever sleeping.  Weight trending down overall.  Spoke with pt at bedside. Pt reports good appetite and eating well. Pt drinking Juven and taking 1 Ensure Max daily. RD encouraged supplement acceptance and PO intake. Pt expresses understanding.  Meal Completion: 50-100%  Medications reviewed and include: vitamin C, SSI, Metformin, MVI with minerals, Juven, Klor-con 20 mEq daily, Ensure Max BID, zinc sulfate  Labs reviewed. CBG's: 82-166  x 24 hours  Diet Order:   Diet Order            Diet Carb Modified Fluid consistency: Thin; Room service appropriate? Yes  Diet effective now                 EDUCATION NEEDS:   Education needs have been addressed  Skin:  Skin Assessment: Skin Integrity Issues: DTI: left side of tongue Stage III: sacrum  Last BM:  01/20/20 large type 4  Height:   Ht Readings from Last 1 Encounters:  01/10/20 5\' 7"  (1.702 m)    Weight:   Wt Readings from Last 1 Encounters:  01/21/20 (!) 155.4 kg    Ideal Body Weight:  67.3 kg  BMI:  Body mass index is 53.66 kg/m.  Estimated Nutritional Needs:   Kcal:  2400-2600  Protein:  135-160 grams  Fluid:  >/= 2.2 L    01/23/20, MS, RD, LDN Inpatient Clinical Dietitian Pager: [phone] Weekend/After Hours: [phone]

## 2020-01-22 ENCOUNTER — Inpatient Hospital Stay (HOSPITAL_COMMUNITY): Payer: BLUE CROSS/BLUE SHIELD | Admitting: Occupational Therapy

## 2020-01-22 ENCOUNTER — Inpatient Hospital Stay (HOSPITAL_COMMUNITY): Payer: BLUE CROSS/BLUE SHIELD | Admitting: Physical Therapy

## 2020-01-22 LAB — FUNGAL ORGANISM REFLEX

## 2020-01-22 LAB — FUNGUS CULTURE RESULT

## 2020-01-22 LAB — GLUCOSE, CAPILLARY
Glucose-Capillary: 120 mg/dL — ABNORMAL HIGH (ref 70–99)
Glucose-Capillary: 131 mg/dL — ABNORMAL HIGH (ref 70–99)
Glucose-Capillary: 106 mg/dL — ABNORMAL HIGH (ref 70–99)
Glucose-Capillary: 105 mg/dL — ABNORMAL HIGH (ref 70–99)

## 2020-01-22 LAB — FUNGUS CULTURE WITH STAIN

## 2020-01-22 NOTE — Progress Notes (Signed)
Occupational Therapy Session Note  Patient Details  Name: Clifford Nichols MRN: [id number] Date of Birth: Apr 17, 1981  Today's Date: 01/22/2020 OT Individual Time: 1500-1530 OT Individual Time Calculation (min): 30 min    Short Term Goals: Week 1:  OT Short Term Goal 1 (Week 1): Pt will complete LB bathing sit to stand with min assist using AE PRN. OT Short Term Goal 2 (Week 1): Pt will complete LB dressing sit to stand with supervision using AE PRN. OT Short Term Goal 3 (Week 1): Pt will maintain standing at the sink during grooming tasks for at least 3 mins with close supervision in order to increase overall endurance. OT Short Term Goal 4 (Week 1): Pt will complete toilet transfers with use of the RW and close supervision to elevated toilet.  Skilled Therapeutic Interventions/Progress Updates:    Pt sitting EOB with mother in room upon OT arrival.  No c/o pain.  Pt donned socks using sock aid with mod I.  Pt needing max assist to donn bilateral shoes with AFOs.  Pt participated in standing dynamic balance challenge with BUE functional reach component in various directions and planes with CGA when reaching outside BOS.  Pt completed functional mobility with tight space negotiation including EOB to toilet, stand<>sit toilet transfer, and back to EOB with supervision.  Pt participated in ADL item retrieval activity from various locations in room including drawers at different heights and closets. Pt educated on improved RW placement when reaching into drawers and closet and pt return demonstrated with excellent follow through needing supervision only to retrieve 10 items.  Pt returned to sitting EOB with mother in room.  Nurse notified of pts location, call bell in reach.  Therapy Documentation Precautions:  Precautions Precautions: Fall Precaution Comments: monitor O2 and HR Restrictions Weight Bearing Restrictions: No   Therapy/Group: Individual Therapy  Amie Critchley 01/22/2020, 4:56  PM

## 2020-01-22 NOTE — Progress Notes (Signed)
Placed pt on Cpap tolerating well. 

## 2020-01-22 NOTE — Progress Notes (Signed)
Patient ID: Clifford Nichols, male   DOB: [birth year], 39 y.o.   MRN: [id number]  Team Conference Report to Patient/Family  Team Conference discussion was reviewed with the patient and caregiver, including goals, any changes in plan of care and target discharge date.  Patient and caregiver express understanding and are in agreement.  The patient has a target discharge date of 01/24/20.  Andria Rhein 01/22/2020, 2:16 PM

## 2020-01-22 NOTE — Patient Care Conference (Signed)
Inpatient RehabilitationTeam Conference and Plan of Care Update Date: 01/22/2020   Time: 1:25 PM    Patient Name: Clifford Nichols      Medical Record Number: [id number]  Date of Birth: 1981-02-10 Sex: Male         Room/Bed: 4M12C/4M12C-01 Payor Info: Payor: BLUE [employer]OSS BLUE [employer]IELD / Plan: [employer] NON-PARTICIPATING / Product Type: *No Product type* /    Admit Date/Time:  01/13/2020  4:09 PM  Primary Diagnosis:  Critical illness polyneuropathy (HCC)  Patient Active Problem List   Diagnosis Date Noted   Critical illness polyneuropathy (HCC) 01/13/2020   Debility 01/13/2020   Status post tracheostomy (HCC)    Pressure injury of skin 12/27/2019   Acute on chronic respiratory failure with hypoxia and hypercapnia (HCC)    Acute respiratory failure (HCC)    ARDS (adult respiratory distress syndrome) (HCC) 12/04/2019   COVID-19 12/03/2019   Acute respiratory failure due to COVID-19 [facility]) 12/02/2019   Hyperglycemia 12/02/2019   Diabetes (HCC) 12/02/2019   HTN (hypertension) 04/27/2018    Expected Discharge Date: Expected Discharge Date: 01/24/20  Team Members Present: Physician leading conference: Dr. Claudette Laws Care Coodinator Present: Chana Bode, RN, BSN, CRRN;Christina Vita Barley, BSW Nurse Present: Other (comment) Anell Barr Pugh LPN) PT Present: Grier Rocher, PT OT Present: Perrin Maltese, OT SLP Present: Suzzette Righter, CF-SLP PPS Coordinator present : Fae Pippin, SLP     Current Status/Progress Goal Weekly Team Focus  Bowel/Bladder   patient continent of b&b. LBM 6/28  Pt will remain contient of b/b  assess q shift/prn   Swallow/Nutrition/ Hydration             ADL's   supervision for UB selfcare, min assist for LB bathing with mod assist for LB dressing secondary to shoes and AFOs.  Close supervision for transfers to the 3:1.  Min to mod assist for toilet hygiene and clothing management  supervision currently  selfcare retraining, transfer training,  balance retraining, therapeutic exercise, DME/AE, pt/family education   Mobility   mod-I bed mobility, CGA/supervision transfers using RW, CGA gait up to 292ft using RW, min assist 8 steps using B HRs ascending forward/descending backwards  supervision/mod-I at ambulatory level  cardiovascular endurance training, B LE strengthening, transfer training, gait trainng, stair training, AFO consultation, pt education, discharge planning   Communication             Safety/Cognition/ Behavioral Observations            Pain   pain in bilateral feet. 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 dressing changes as ordered    Rehab Goals Patient on target to meet rehab goals: Yes Rehab Goals Revised: on target with goals *See Care Plan and progress notes for long and short-term goals.     Barriers to Discharge  Current Status/Progress Possible Resolutions Date Resolved   Nursing                  PT                    OT                  SLP                Care Coordinator New oxygen   on target  Discharge Planning/Teaching Needs:  Patient plans to discharge to mom home inititally due to steps at his home  Education scheduled with spouse this Thursday   Team Discussion:  Addressing polyneuropathy and foot drop. Continue treatment for Stage 2 on sacrum; healing. Family education set for 01/23/20. Discharge to mother's home due to number of steps to enter patient's home. Janina Mayo is out; likely have gap before CPAP available for use however likely not qualify for O2 at night until CPAP obtained.   Revisions to Treatment Plan:  Education with wife on need for MOD assist with LB dressing (shoes). Minimal assistance for LB bathing using AD but will need assistance to complete hygiene.    Medical Summary Current Status: trach out, pain  control in feet, sacral wound healing well Weekly Focus/Goal: d/c planning, work on AFO don/doff  Barriers to Discharge: Weight;Wound care   Possible Resolutions to Barriers: Plan d/c this week, set up sleep study as OP, work on steps but will likely go to mothers home shich is more accessible   Continued Need for Acute Rehabilitation Level of Care: The patient requires daily medical management by a physician with specialized training in physical medicine and rehabilitation for the following reasons: Direction of a multidisciplinary physical rehabilitation program to maximize functional independence : Yes Medical management of patient stability for increased activity during participation in an intensive rehabilitation regime.: Yes Analysis of laboratory values and/or radiology reports with any subsequent need for medication adjustment and/or medical intervention. : Yes   I attest that I was present, lead the team conference, and concur with the assessment and plan of the team.   Chana Bode B 01/22/2020, 1:25 PM

## 2020-01-22 NOTE — Progress Notes (Signed)
[facility] PHYSICAL MEDICINE & REHABILITATION PROGRESS NOTE   Subjective/Complaints: No issues overnite, breathing well. Has not had sleep study in past no CPAP at home   ROS - Pt denies SOB, abd pain, CP, N/V/C/D, and vision changes   Objective:   No results found. Recent Labs    01/20/20 0456  WBC 6.3  HGB 10.4*  HCT 35.2*  PLT 182   Recent Labs    01/20/20 0456  NA 137  K 4.3  CL 102  CO2 24  GLUCOSE 106*  BUN 9  CREATININE 0.87  CALCIUM 9.0    Intake/Output Summary (Last 24 hours) at 01/22/2020 0836 Last data filed at 01/22/2020 0300 Gross per 24 hour  Intake 300 ml  Output 725 ml  Net -425 ml     Physical Exam: Vital Signs Blood pressure 120/62, pulse 81, temperature 98 F (36.7 C), resp. rate 17, weight (!) 154.7 kg, SpO2 95 %.  General: No acute distress Mood and affect are appropriate Heart: Regular rate and rhythm no rubs murmurs or extra sounds Lungs: Clear to auscultation, breathing unlabored, no rales or wheezes Abdomen: Positive bowel sounds, soft nontender to palpation, nondistended Extremities: No clubbing, cyanosis, or edema Skin: No evidence of breakdown, no evidence of rash  Skin: No evidence of breakdown, no evidence of rash Neurologic: Cranial nerves II through XII intact, motor strength is 5/5 in bilateral deltoid, bicep, tricep, grip, hip flexor, knee extensors,0/5  ankle dorsiflexor and plantar flexor Sensory examabsent sensation to light touch and proprioception right foot , absent LT left foot reduced proprio  Musculoskeletal: Full range of motion in all 4 extremities. No joint swelling  Assessment/Plan: 1. Functional deficits secondary to Critical illness polyneuropathy  which require 3+ hours per day of interdisciplinary therapy in a comprehensive inpatient rehab setting.  Physiatrist is providing close team supervision and 24 hour management of active medical problems listed below.  Physiatrist and rehab team continue to  assess barriers to discharge/monitor patient progress toward functional and medical goals  Care Tool:  Bathing    Body parts bathed by patient: Right arm, Left arm, Chest, Abdomen, Front perineal area, Right upper leg, Left upper leg, Face, Right lower leg, Left lower leg   Body parts bathed by helper: Buttocks     Bathing assist Assist Level: Minimal Assistance - Patient > 75%     Upper Body Dressing/Undressing Upper body dressing   What is the patient wearing?: Pull over shirt    Upper body assist Assist Level: Set up assist    Lower Body Dressing/Undressing Lower body dressing      What is the patient wearing?: Pants     Lower body assist Assist for lower body dressing: Supervision/Verbal cueing     Toileting Toileting    Toileting assist Assist for toileting: Minimal Assistance - Patient > 75% Assistive Device Comment: to bathroom   Transfers Chair/bed transfer  Transfers assist     Chair/bed transfer assist level: Contact Guard/Touching assist Chair/bed transfer assistive device: Geologist, engineering   Ambulation assist   Ambulation activity did not occur: Safety/medical concerns (required use of RW (does not use AD baseline))  Assist level: Contact Guard/Touching assist Assistive device: Walker-rolling Max distance: 225ft   Walk 10 feet activity   Assist  Walk 10 feet activity did not occur: Safety/medical concerns  Assist level: Contact Guard/Touching assist Assistive device: Walker-rolling   Walk 50 feet activity   Assist Walk 50 feet with 2 turns activity did  not occur: Safety/medical concerns  Assist level: Contact Guard/Touching assist Assistive device: Walker-rolling    Walk 150 feet activity   Assist Walk 150 feet activity did not occur: Safety/medical concerns  Assist level: Contact Guard/Touching assist Assistive device: Walker-rolling    Walk 10 feet on uneven surface  activity   Assist Walk 10 feet on  uneven surfaces activity did not occur: Safety/medical concerns         Wheelchair     Assist Will patient use wheelchair at discharge?: No             Wheelchair 50 feet with 2 turns activity    Assist            Wheelchair 150 feet activity     Assist          Blood pressure 120/62, pulse 81, temperature 98 F (36.7 C), resp. rate 17, weight (!) 154.7 kg, SpO2 95 %.  Medical Problem List and Plan: 1.   Reduced mobility and self-care skills secondary to critical illness polyneuropathy -patient may not shower? Since trach is out, would think pt can shower -ELOS/Goals: 7 to 10 days  -Continue CIR 2. Antithrombotics: -DVT/anticoagulation:Pharmaceutical:Lovenox80mg  QD per Pharm -antiplatelet therapy: N/A 3. Pain Management:ON oxycodone 10 mg every 4 hours--denies any issues with pain. Attempt to start wean over next few days. Increased  Gabapentin 600mg   TID    6/30: tolerating Cymbalta well.  4. Mood:LCSW to follow for evaluation and support. -antipsychotic agents: N/A 5. Neuropsych: This patientiscapable of making decisions onhisown behalf. 6.Sacral decub/Skin/Wound Care:Hydrotherapy to sacral wound 5 X week with santyl wet to dry dressing changes. Continue Zinc and vitamin C. Will add protein supplement to promote healing. 7. Fluids/Electrolytes/Nutrition:Monitor I/O. Check lytes in am.  8. HTN: Monitor BP tid--controlled without meds. Vitals:   01/21/20 2017 01/22/20 0410  BP: 115/70 120/62  Pulse: (!) 102 81  Resp: 17 17  Temp: 99 F (37.2 C) 98 F (36.7 C)  SpO2: 98% 95%   6/30 controlled   9. Covid PNA with hypoxemic respiratory failure:Keep HOB >35 degrees at nights.On 5 L oxygen per ATC.- also with chronic sleep apnea, 6/27- pt decannulated SELF 6/25! 10. New diagnosis T2DM: Hgb A1C-6.6. Will transition from levemir to oral hypoglycemic regimen. Change diet to CM.  Continue to monitor BS ac/hs and titrate medication as indicated.  CBG (last 3)  Recent Labs    01/21/20 1708 01/21/20 2253 01/22/20 0617  GLUCAP 98 112* 120*    6/30: well controlled. Cont Metformin 11. Anemia of critical illness: Is improving from drop to 7.7-->9.6.  6/28: Hgb up to 10.4 12. Morbid obesity- BMI 54.5: Educated on weight loss and healthy diet. 13. OSA: Has had study years ago but could not afford CPAP per mother. Will need sleep study after discharge. 14. Resting Tachycardia: Likely due to debility--HR in 110-120.If BP is up can add BB  Monitor triggering and keeping pt awake, will ask nsg to change setting , looks like monitor triggering at 100bpm, should move up to 110   6/26- will either change monitor setting or stop- since O2 sats look great- >93% in last 24+ hours.  6/27- slept better- vitals look great    LOS: 9 days A FACE TO FACE EVALUATION WAS PERFORMED  7/27 01/22/2020, 8:36 AM

## 2020-01-22 NOTE — Progress Notes (Signed)
Physical Therapy Session Note  Patient Details  Name: MEHRAN GUDERIAN MRN: [id number] Date of Birth: [birth year]  Today's Date: 01/22/2020 PT Individual Time: 1018-1058 PT Individual Time Calculation (min): 40 min   Short Term Goals: Week 2:  PT Short Term Goal 1 (Week 2): = to LTGs based on ELOS  Skilled Therapeutic Interventions/Progress Updates:    Patient received sitting at EOB, pleasant and willing to participate in session. Able to complete functional transfers from bed<->WC with general S and both RW and no device easily. Spent a good part of session working on functional balance based tasks including narrow BOS, tandem stance, and cone taps with B AFOs on and Min-ModA to maintain balance today, high levels of proximal muscle activation and fatigue noted likely as compensation for ankle DF/PF paralysis. Tolerated gait training approximately 256f with RW/S today but fatigued. HR up to 121, O2 no lower than 98% on room air. Left sitting at EOB with all needs met this morning.   Therapy Documentation Precautions:  Precautions Precautions: Fall Precaution Comments: monitor O2 and HR Restrictions Weight Bearing Restrictions: No Pain: Pain Assessment Pain Scale: 0-10 Pain Score: 8  Pain Type: Neuropathic pain Pain Location: Foot Pain Orientation: Right;Left Pain Descriptors / Indicators: Numbness;Tightness Pain Onset: On-going Patients Stated Pain Goal: 0 Pain Intervention(s): Ambulation/increased activity;Distraction Multiple Pain Sites: No    Therapy/Group: Individual Therapy   KWindell Norfolk DPT, PN1   Supplemental Physical Therapist [place]   Pager [phone] Rehab Office [phone]  01/22/2020, 12:42 PM

## 2020-01-22 NOTE — Progress Notes (Signed)
Occupational Therapy Session Note  Patient Details  Name: Clifford Nichols MRN: [id number] Date of Birth: 1980-11-20  Today's Date: 01/22/2020 OT Individual Time: 4166-0630 OT Individual Time Calculation (min): 72 min    Short Term Goals: Week 1:  OT Short Term Goal 1 (Week 1): Pt will complete LB bathing sit to stand with min assist using AE PRN. OT Short Term Goal 2 (Week 1): Pt will complete LB dressing sit to stand with supervision using AE PRN. OT Short Term Goal 3 (Week 1): Pt will maintain standing at the sink during grooming tasks for at least 3 mins with close supervision in order to increase overall endurance. OT Short Term Goal 4 (Week 1): Pt will complete toilet transfers with use of the RW and close supervision to elevated toilet.  Skilled Therapeutic Interventions/Progress Updates:    Pt worked on bathing and dressing during session from the EOB.  He was able to complete all UB selfcare with supervision.  He then completed LB bathing with min assist for bathing secondary to not being able to reach his buttocks thoroughly.  He then needed supervision with use of the reacher for donning his pants sit to stand.  A sockaide was used for donning his socks with max assist to donn the AFOs and ties his shoes.  He was setup assist with oral hygiene as well.  Oxygen sats 99% on RA with HR in the 80s during sitting tasks.  Finished session sitting EOB with call button and phone in reach working on eating breakfast.    Therapy Documentation Precautions:  Precautions Precautions: Fall Precaution Comments: monitor O2 and HR Restrictions Weight Bearing Restrictions: No  Pain: Pain Assessment Pain Scale: Faces Pain Score: 0-No pain ADL: See Care Tool Section for some details of mobility and selfcare  Therapy/Group: Individual Therapy  Jillianne Gamino OTR/L 01/22/2020, 10:50 AM

## 2020-01-22 NOTE — Progress Notes (Addendum)
Patient ID: Clifford Nichols, male   DOB: 09-29-1980, 39 y.o.   MRN: [id number]   Home o2 and rolling walker ordered today through [employer], due to upcoming holiday

## 2020-01-22 NOTE — Progress Notes (Signed)
Occupational Therapy Session Note  Patient Details  Name: Clifford Nichols MRN: [id number] Date of Birth: Dec 06, 1980  Today's Date: 01/22/2020 OT Individual Time: 1310-1405 OT Individual Time Calculation (min): 55 min    Short Term Goals: Week 1:  OT Short Term Goal 1 (Week 1): Pt will complete LB bathing sit to stand with min assist using AE PRN. OT Short Term Goal 2 (Week 1): Pt will complete LB dressing sit to stand with supervision using AE PRN. OT Short Term Goal 3 (Week 1): Pt will maintain standing at the sink during grooming tasks for at least 3 mins with close supervision in order to increase overall endurance. OT Short Term Goal 4 (Week 1): Pt will complete toilet transfers with use of the RW and close supervision to elevated toilet.  Skilled Therapeutic Interventions/Progress Updates:    Pt completed functional mobility from his room to the tub room with use of the RW and close supervision.  There he practiced tub/shower transfers with use of the tub bench with close supervision.  Discussed need for a hand held shower as well as making sure to remove throw rugs from the bathroom and kitchen as well.  Once completed, he transferred over to the UE ergonometer for BUE strengthening.  He completed one 6 minute interval with resistance on level 12 and RPMs kept greater than 30 per minute.  He was able to complete a second set for 4 mins peddling in reverse at the same level.  He then completed transfer to the therapy mat where he worked on [employer].  He was able to complete one set of 10 reps for shoulder flexion with use of the dowel rod.  He then completed an additional set of shoulder flexion AROM with use of the RUE.  The green therapy band was then used for shoulder row for 1 set of 15 reps as well as 1 set of biceps curl for each UE.  He demonstrates decreased strength in external rotation of the left arm.  Had him transition to sidelying for 2 sets of 10 reps for external  rotation AROM.  Finished session with ambulation back to the room with close supervision to complete session.  He was left sitting EOB with his spouse present and call button and phone in reach.     Therapy Documentation Precautions:  Precautions Precautions: Fall Precaution Comments: monitor O2 and HR Restrictions Weight Bearing Restrictions: No  Pain: Pain Assessment Pain Scale: 0-10 Pain Score: 0-No pain Pain Type: Neuropathic pain Pain Location: Foot Pain Orientation: Right;Left Pain Descriptors / Indicators: Numbness;Tightness Pain Onset: On-going Patients Stated Pain Goal: 0 Pain Intervention(s): Ambulation/increased activity;Distraction Multiple Pain Sites: No ADL: See Care Tool Section for some details of mobility and selfcare  Therapy/Group: Individual Therapy  Lillah Standre OTR/L 01/22/2020, 4:10 PM

## 2020-01-22 NOTE — Plan of Care (Signed)
  Problem: Education: Goal: Knowledge about tracheostomy care/management will improve Outcome: Progressing   Problem: Activity: Goal: Ability to tolerate increased activity will improve Outcome: Progressing   Problem: Respiratory: Goal: Patent airway maintenance will improve Outcome: Progressing   Problem: Role Relationship: Goal: Ability to communicate will improve Outcome: Progressing   Problem: RH SKIN INTEGRITY Goal: RH STG SKIN FREE OF INFECTION/BREAKDOWN Outcome: Progressing Goal: RH STG MAINTAIN SKIN INTEGRITY WITH ASSISTANCE Description: STG Maintain Skin Integrity With min Assistance. Outcome: Progressing Goal: RH STG ABLE TO PERFORM INCISION/WOUND CARE W/ASSISTANCE Description: STG Able To Perform Incision/Wound Care With min Assistance. Outcome: Progressing   Problem: RH SAFETY Goal: RH STG ADHERE TO SAFETY PRECAUTIONS W/ASSISTANCE/DEVICE Description: STG Adhere to Safety Precautions With Mod I  Assistance/Device. Outcome: Progressing   Problem: RH PAIN MANAGEMENT Goal: RH STG PAIN MANAGED AT OR BELOW PT'S PAIN GOAL Description: Pain goal less than 5 Outcome: Progressing   Problem: RH KNOWLEDGE DEFICIT GENERAL Goal: RH STG INCREASE KNOWLEDGE OF SELF CARE AFTER HOSPITALIZATION Description: Patient will be able to direct care at discharge using educational resources independently Outcome: Progressing   Problem: Consults Goal: RH GENERAL PATIENT EDUCATION Description: See Patient Education module for education specifics. Outcome: Progressing   

## 2020-01-23 ENCOUNTER — Encounter (HOSPITAL_COMMUNITY): Payer: BLUE CROSS/BLUE SHIELD | Admitting: Occupational Therapy

## 2020-01-23 ENCOUNTER — Ambulatory Visit (HOSPITAL_COMMUNITY): Payer: BLUE CROSS/BLUE SHIELD | Admitting: Physical Therapy

## 2020-01-23 ENCOUNTER — Inpatient Hospital Stay (HOSPITAL_COMMUNITY): Payer: BLUE CROSS/BLUE SHIELD | Admitting: *Deleted

## 2020-01-23 ENCOUNTER — Inpatient Hospital Stay (HOSPITAL_COMMUNITY): Payer: BLUE CROSS/BLUE SHIELD | Admitting: Occupational Therapy

## 2020-01-23 LAB — GLUCOSE, CAPILLARY
Glucose-Capillary: 107 mg/dL — ABNORMAL HIGH (ref 70–99)
Glucose-Capillary: 105 mg/dL — ABNORMAL HIGH (ref 70–99)
Glucose-Capillary: 99 mg/dL (ref 70–99)
Glucose-Capillary: 99 mg/dL (ref 70–99)

## 2020-01-23 MED ORDER — DULOXETINE HCL 60 MG PO CPEP: 60.0000 mg | ORAL_CAPSULE | Freq: Every day | ORAL | 3 refills | Status: DC

## 2020-01-23 MED ORDER — ZINC SULFATE 220 (50 ZN) MG PO CAPS: 220.0000 mg | ORAL_CAPSULE | Freq: Every day | ORAL | 1 refills | Status: DC

## 2020-01-23 MED ORDER — ENOXAPARIN SODIUM 80 MG/0.8ML ~~LOC~~ SOLN
75.0000 mg | SUBCUTANEOUS | Status: DC
  Administered 2020-01-23: 75 mg via SUBCUTANEOUS
  Filled 2020-01-23 (×2): qty 0.75

## 2020-01-23 MED ORDER — ASCORBIC ACID 500 MG PO TABS: 500.0000 mg | ORAL_TABLET | Freq: Every day | ORAL | 0 refills | Status: DC

## 2020-01-23 MED ORDER — POLYETHYLENE GLYCOL 3350 17 G PO PACK: 17.0000 g | PACK | Freq: Every day | ORAL | 0 refills | Status: DC | PRN

## 2020-01-23 MED ORDER — MELATONIN 3 MG PO TABS: 3.0000 mg | ORAL_TABLET | Freq: Every day | ORAL | 0 refills | Status: DC

## 2020-01-23 MED ORDER — PANTOPRAZOLE SODIUM 40 MG PO TBEC
40.0000 mg | DELAYED_RELEASE_TABLET | Freq: Every day | ORAL | 0 refills | Status: DC
Start: 2020-01-23 — End: 2020-03-24

## 2020-01-23 MED ORDER — ADULT MULTIVITAMIN W/MINERALS CH: 1.0000 | ORAL_TABLET | Freq: Every day | ORAL | Status: DC

## 2020-01-23 MED ORDER — OXYCODONE HCL 10 MG PO TABS: 10.0000 mg | ORAL_TABLET | ORAL | 0 refills | Status: DC | PRN

## 2020-01-23 MED ORDER — ACETAMINOPHEN 325 MG PO TABS: 325.0000 mg | ORAL_TABLET | ORAL | Status: AC | PRN

## 2020-01-23 MED ORDER — BISACODYL 10 MG RE SUPP: 10.0000 mg | Freq: Every day | RECTAL | 0 refills | Status: DC | PRN

## 2020-01-23 MED ORDER — METFORMIN HCL 500 MG PO TABS: 500.0000 mg | ORAL_TABLET | Freq: Every day | ORAL | 1 refills | Status: DC

## 2020-01-23 MED ORDER — GABAPENTIN 300 MG PO CAPS: 600.0000 mg | ORAL_CAPSULE | Freq: Three times a day (TID) | ORAL | 1 refills | Status: AC

## 2020-01-23 NOTE — Plan of Care (Signed)
  Problem: Education: Goal: Knowledge about tracheostomy care/management will improve Outcome: Progressing   Problem: Activity: Goal: Ability to tolerate increased activity will improve Outcome: Progressing   Problem: Respiratory: Goal: Patent airway maintenance will improve Outcome: Progressing   Problem: Role Relationship: Goal: Ability to communicate will improve Outcome: Progressing   Problem: RH SKIN INTEGRITY Goal: RH STG SKIN FREE OF INFECTION/BREAKDOWN Outcome: Progressing Goal: RH STG MAINTAIN SKIN INTEGRITY WITH ASSISTANCE Description: STG Maintain Skin Integrity With min Assistance. Outcome: Progressing Goal: RH STG ABLE TO PERFORM INCISION/WOUND CARE W/ASSISTANCE Description: STG Able To Perform Incision/Wound Care With min Assistance. Outcome: Progressing   Problem: RH SAFETY Goal: RH STG ADHERE TO SAFETY PRECAUTIONS W/ASSISTANCE/DEVICE Description: STG Adhere to Safety Precautions With Mod I  Assistance/Device. Outcome: Progressing   Problem: RH PAIN MANAGEMENT Goal: RH STG PAIN MANAGED AT OR BELOW PT'S PAIN GOAL Description: Pain goal less than 5 Outcome: Progressing   Problem: RH KNOWLEDGE DEFICIT GENERAL Goal: RH STG INCREASE KNOWLEDGE OF SELF CARE AFTER HOSPITALIZATION Description: Patient will be able to direct care at discharge using educational resources independently Outcome: Progressing   Problem: Consults Goal: RH GENERAL PATIENT EDUCATION Description: See Patient Education module for education specifics. Outcome: Progressing   

## 2020-01-23 NOTE — Discharge Instructions (Signed)
Inpatient Rehab Discharge Instructions  Clifford Nichols Discharge date and time: No discharge date for patient encounter.   Activities/Precautions/ Functional Status: Activity: activity as tolerated Diet: diabetic diet Wound Care: keep wound clean and dry Functional status:  ___ No restrictions     ___ Walk up steps independently ___ 24/7 supervision/assistance   ___ Walk up steps with assistance ___ Intermittent supervision/assistance  ___ Bathe/dress independently ___ Walk with walker     _x__ Bathe/dress with assistance ___ Walk Independently    ___ Shower independently ___ Walk with assistance    ___ Shower with assistance ___ No alcohol     ___ Return to work/school ________ COMMUNITY REFERRALS UPON DISCHARGE:     Outpatient: PT     OT               Agency: [facility]  Phone:  [phone]              Appointment Date/Time: To be scheduled with patient/caregiver  Medical Equipment/Items Ordered: Home o2 concentrator, Rolling Walker                                                 Agency/Supplier: [name]  Special Instructions: No driving smoking or alcohol  After cleansing sacral/coccyx wound with saline placed Xeroform gauze to wound bed top of foam dressing change daily  Follow-up pulmonary services Dr. Delton Coombes for sleep study to evaluate for CPAP   My questions have been answered and I understand these instructions. I will adhere to these goals and the provided educational materials after my discharge from the hospital.  Patient/Caregiver Signature _______________________________ Date __________  Clinician Signature _______________________________________ Date __________  Please bring this form and your medication list with you to all your follow-up doctor's appointments.

## 2020-01-23 NOTE — Progress Notes (Signed)
[facility] PHYSICAL MEDICINE & REHABILITATION PROGRESS NOTE   Subjective/Complaints: Wears AFO s for bilateral foot drop  Discussed purpose of family training, Wife and mother today   ROS - Pt denies SOB, abd pain, CP, N/V/C/D, and vision changes   Objective:   No results found. No results for input(s): WBC, HGB, HCT, PLT in the last 72 hours. No results for input(s): NA, K, CL, CO2, GLUCOSE, BUN, CREATININE, CALCIUM in the last 72 hours.  Intake/Output Summary (Last 24 hours) at 01/23/2020 0735 Last data filed at 01/23/2020 0450 Gross per 24 hour  Intake 520 ml  Output 1050 ml  Net -530 ml     Physical Exam: Vital Signs Blood pressure 127/80, pulse 77, temperature 98.4 F (36.9 C), temperature source Oral, resp. rate 17, weight (!) 153.3 kg, SpO2 99 %.  General: No acute distress Mood and affect are appropriate Heart: Regular rate and rhythm no rubs murmurs or extra sounds Lungs: Clear to auscultation, breathing unlabored, no rales or wheezes Abdomen: Positive bowel sounds, soft nontender to palpation, nondistended Extremities: No clubbing, cyanosis, or edema Skin: No evidence of breakdown, no evidence of rash  Skin: No evidence of breakdown, no evidence of rash Neurologic: Cranial nerves II through XII intact, motor strength is 5/5 in bilateral deltoid, bicep, tricep, grip, hip flexor, knee extensors,0/5  ankle dorsiflexor and plantar flexor Sensory examabsent sensation to light touch and proprioception right foot , absent LT left foot reduced proprio  Musculoskeletal: Full range of motion in all 4 extremities. No joint swelling  Assessment/Plan: 1. Functional deficits secondary to Critical illness polyneuropathy   Family training today , plan d/c in am  Care Tool:  Bathing    Body parts bathed by patient: Right arm, Left arm, Chest, Abdomen, Front perineal area, Right upper leg, Left upper leg, Face, Right lower leg, Left lower leg   Body parts bathed by helper:  Buttocks     Bathing assist Assist Level: Minimal Assistance - Patient > 75%     Upper Body Dressing/Undressing Upper body dressing   What is the patient wearing?: Pull over shirt    Upper body assist Assist Level: Set up assist    Lower Body Dressing/Undressing Lower body dressing      What is the patient wearing?: Pants     Lower body assist Assist for lower body dressing: Supervision/Verbal cueing     Toileting Toileting    Toileting assist Assist for toileting: Minimal Assistance - Patient > 75% Assistive Device Comment: to bathroom   Transfers Chair/bed transfer  Transfers assist     Chair/bed transfer assist level: Supervision/Verbal cueing Chair/bed transfer assistive device: Other (no device)   Locomotion Ambulation   Ambulation assist   Ambulation activity did not occur: Safety/medical concerns (required use of RW (does not use AD baseline))  Assist level: Supervision/Verbal cueing Assistive device: Walker-rolling Max distance: 218ft   Walk 10 feet activity   Assist  Walk 10 feet activity did not occur: Safety/medical concerns  Assist level: Supervision/Verbal cueing Assistive device: Walker-rolling   Walk 50 feet activity   Assist Walk 50 feet with 2 turns activity did not occur: Safety/medical concerns  Assist level: Supervision/Verbal cueing Assistive device: Walker-rolling    Walk 150 feet activity   Assist Walk 150 feet activity did not occur: Safety/medical concerns  Assist level: Supervision/Verbal cueing Assistive device: Walker-rolling    Walk 10 feet on uneven surface  activity   Assist Walk 10 feet on uneven surfaces activity did not  occur: Safety/medical concerns         Wheelchair     Assist Will patient use wheelchair at discharge?: No             Wheelchair 50 feet with 2 turns activity    Assist            Wheelchair 150 feet activity     Assist          Blood pressure  127/80, pulse 77, temperature 98.4 F (36.9 C), temperature source Oral, resp. rate 17, weight (!) 153.3 kg, SpO2 99 %.  Medical Problem List and Plan: 1.   Reduced mobility and self-care skills secondary to critical illness polyneuropathy  D/C 7/2, family training 7/1  PMR f/u 1 month post d/c 2. Antithrombotics: -DVT/anticoagulation:Pharmaceutical:Lovenox80mg  QD per Pharm, D/C in am  -antiplatelet therapy: N/A 3. Pain Management:ON oxycodone 10 mg every 4 hours--denies any issues with pain. Attempt to start wean over next few days. Increased  Gabapentin 600mg   TID    6/30: tolerating Cymbalta well.  4. Mood:LCSW to follow for evaluation and support. -antipsychotic agents: N/A 5. Neuropsych: This patientiscapable of making decisions onhisown behalf. 6.Sacral decub/Skin/Wound Care:Hydrotherapy to sacral wound 5 X week with santyl wet to dry dressing changes. Continue Zinc and vitamin C. Will add protein supplement to promote healing. 7. Fluids/Electrolytes/Nutrition:Monitor I/O. Check lytes in am.  8. HTN: Monitor BP tid--controlled without meds. Vitals:   01/22/20 1952 01/23/20 0528  BP: 128/65 127/80  Pulse: 95 77  Resp: 19 17  Temp: 98.5 F (36.9 C) 98.4 F (36.9 C)  SpO2: 100% 99%   7/1 controlled  9. Covid PNA with hypoxemic respiratory failure:Keep HOB >35 degrees at nights.On 5 L oxygen per ATC.- also with chronic sleep apnea, 6/27- pt decannulated SELF 6/25! 10. New diagnosis T2DM: Hgb A1C-6.6. Will transition from levemir to oral hypoglycemic regimen. Change diet to CM. Continue to monitor BS ac/hs and titrate medication as indicated.  CBG (last 3)  Recent Labs    01/22/20 1640 01/22/20 2101 01/23/20 0608  GLUCAP 106* 105* 107*    7/1: well controlled. Cont Metformin 11. Anemia of critical illness: Is improving from drop to 7.7-->9.6.  6/28: Hgb up to 10.4 12. Morbid obesity- BMI 54.5: Educated on weight loss  and healthy diet. 13. OSA: Has had study years ago but could not afford CPAP per mother. Will need sleep study after discharge. 14. Resting Tachycardia: Likely due to debility--HR in 110-120.If BP is up can add BB  Monitor triggering and keeping pt awake, will ask nsg to change setting , looks like monitor triggering at 100bpm, should move up to 110   6/26- will either change monitor setting or stop- since O2 sats look great- >93% in last 24+ hours.  6/27- slept better- vitals look great    LOS: 10 days A FACE TO FACE EVALUATION WAS PERFORMED  7/27 01/23/2020, 7:35 AM

## 2020-01-23 NOTE — Progress Notes (Signed)
Occupational Therapy Session Note  Patient Details  Name: Clifford Nichols MRN: [id number] Date of Birth: 1980/12/24  Today's Date: 01/23/2020 OT Individual Time: 0903-1001 OT Individual Time Calculation (min): 58 min    Short Term Goals: Week 1:  OT Short Term Goal 1 (Week 1): Pt will complete LB bathing sit to stand with min assist using AE PRN. OT Short Term Goal 2 (Week 1): Pt will complete LB dressing sit to stand with supervision using AE PRN. OT Short Term Goal 3 (Week 1): Pt will maintain standing at the sink during grooming tasks for at least 3 mins with close supervision in order to increase overall endurance. OT Short Term Goal 4 (Week 1): Pt will complete toilet transfers with use of the RW and close supervision to elevated toilet.  Skilled Therapeutic Interventions/Progress Updates:    Pt completed functional mobility to the shower bench with supervision using the RW for support.  He was then able to remove all of his clothing at supervision level with use of the reacher to remove his gripper socks.  He was then able to complete bathing in sitting with supervision.  Toilet aide was used for washing his buttocks while sitting on the tub bench with cutout.  He was able to dry off sitting on the seat and then donn his shorts with supervision using the reacher.  When standing to pull them up over his hips, he dried his buttocks first and then completed the task.  Supervision for functional mobility with use of the RW back out to the EOB to complete dressing.  He was able to then donn his pullover shirt with supervision as well as his socks, with use of the sockaide.  Max assist was needed for donning his shoes with AFOs in place.  Pt's spouse was able to assist with this as well and observed all of therapy session.  She plans to purchase a tub bench on her own at discharge.  He was left sitting EOB with call button and phone in reach with PT coming in next.    Therapy  Documentation Precautions:  Precautions Precautions: Fall Precaution Comments: monitor O2 and HR Restrictions Weight Bearing Restrictions: No   Pain: Pain Assessment Pain Scale: Faces Pain Score: 0-No pain Faces Pain Scale: No hurt ADL: See Care Tool Section for some details of mobility and selfcare  Therapy/Group: Individual Therapy  David Towson OTR/L 01/23/2020, 12:34 PM

## 2020-01-23 NOTE — Progress Notes (Addendum)
Lovenox home education provided to the pt.

## 2020-01-23 NOTE — Progress Notes (Signed)
Physical Therapy Discharge Summary  Patient Details  Name: Clifford Nichols MRN: [id number] Date of Birth: Feb 10, 1981  Today's Date: 01/23/2020 PT Individual Time: 1004-1104 PT Individual Time Calculation (min): 60 min    Patient has met 8 of 9 long term goals due to improved activity tolerance, improved balance, increased strength, ability to compensate for deficits and functional use of  right upper extremity, right lower extremity, left upper extremity and left lower extremity.  Patient to discharge at an ambulatory level Supervision.   Patient's care partner attended hands-on family education/training and is independent to provide the necessary physical assistance at discharge.  Reasons goals not met: Pt requires up to min assist when performing higher level dynamic balance tasks.  Recommendation:  Patient will benefit from ongoing skilled PT services in outpatient setting to continue to advance safe functional mobility, address ongoing impairments in B LE strength, standing balance, gait with LRAD, stair navigation, cardiovascular endurance, and minimize fall risk.  Equipment: Aundra Dubin  Reasons for discharge: treatment goals met and discharge from hospital  Patient/family agrees with progress made and goals achieved: Yes   Skilled Therapeutic Interventions/Progress Updates:  Pt received sitting EOB with his wife and mother present for hands-on family education/training. Pt agreeable to therapy session. Therapist educated pt's family on the following: pt's CLOF, recommendation for 24hr support at D/C, DME recommendations, follow-up therapy recommendations, need to wear AFOs for standing/ambulating, AFO management with skin assessments due to impaired sensation, and performing daily HEP as prescribed on Tuesday. Sit<>stands using bari-RW mod-I throughout session. Gait ~23f to gym using bari-RW with supervision for safety - continues to demonstrate slower gait speed, increased B LE hip  external rotation, and slight gait deviation due to AFOs and impaired ankle DF/PF strength. Simulated (small SUV height) ambulatory car transfer using RW with supervision for safety and education on sitting prior to turning to place feet in car. Ambulated ~14fup/down ramp x2 with CGA from therapist then family with education on descending slowly and keeping AD close for increased support. Transported to main gym in w/c for energy conservation. Therapist educated pt's family on stair navigation technique, how to properly guard pt, family assisting with RW management, and placing chairs at top/bottom of steps to allow seated rest breaks if needed. Pt ascended/descended 12 steps using B HRs with therapist initially showing proper technique for first 4 steps then family providing assist for the 8 other steps - pt ascending forward with reciprocal pattern unless becoming fatigue then step-to leading with L LE; descends backwards step-to leading with R LE - pt family demonstrate understanding and safe assistance. Educated on stepping on/off 4" curb step using RW with pt stepping up forward then down backwards using RW with CGA/min assist for balance - most difficult portion is bringing RW back down off step after stepping down backwards causing minor posterior LOB, discussed using staggered stance to improve balance - pt/family demonstrate safe ability to perform this task. LiLattie HawRec Therapist, present for remainder of session for community reintegration training. Transported pt to/from outside in w/c. Ambulated ~7021fsing RW over brick and concrete paved path with pt's family providing CGA for safety. Educated and discussed with pt the importance of energy conservation, planning mobility route to allow seated rest breaks as needed, reintegration into community and how to address assistance in/out bathroom, location of sitting area to allow for easier access and importance of pt advocating for his needs. Pt/family  report no questions/concerns and report feeling confident to assist pt  at home upon discharge. Transported back up to room and pt left seated in w/c with needs in reach and family present.  PT Discharge Precautions/Restrictions Precautions Precautions: Fall Precaution Comments: monitor O2 and HR Restrictions Weight Bearing Restrictions: No Pain Pain Assessment Pain Scale: Faces Faces Pain Scale: No hurt  No reports of pain during session. Perception  Perception Perception: Within Functional Limits Praxis Praxis: Intact  Cognition Overall Cognitive Status: Within Functional Limits for tasks assessed Arousal/Alertness: Awake/alert Orientation Level: Oriented X4 Attention: Focused;Sustained Focused Attention: Appears intact Sustained Attention: Appears intact Selective Attention: Appears intact Memory: Appears intact Awareness: Appears intact Safety/Judgment: Appears intact Sensation Sensation Light Touch: Impaired Detail Peripheral sensation comments: impaired distally in B LEs Light Touch Impaired Details: Impaired RLE;Impaired LLE Hot/Cold: Not tested Proprioception: Impaired Detail Proprioception Impaired Details: Impaired RLE;Impaired LLE Stereognosis: Not tested Coordination Gross Motor Movements are Fluid and Coordinated: No Coordination and Movement Description: significantly improved but continue to be impaired due to impaired B LE strength, impaired sensation, and impaired balance Motor  Motor Motor: Other (comment) Motor - Discharge Observations: Generalized weakness with more impaired B LE strength though improved since evaluation (still no ankle PF/DF muscle activation)  Mobility Bed Mobility Bed Mobility: Supine to Sit;Sit to Supine Rolling Right: Independent Rolling Left: Independent Supine to Sit: Independent Sit to Supine: Independent Transfers Transfers: Sit to Stand;Stand to Sit;Stand Pivot Transfers Sit to Stand: Independent with assistive  device Stand to Sit: Independent with assistive device Stand Pivot Transfers: Independent with assistive device Transfer (Assistive device): Rolling walker Locomotion  Gait Ambulation: Yes Gait Assistance: Supervision/Verbal cueing Assistive device: Rolling walker;Other (Comment) (bilateral AFOs) Gait Gait: Yes Gait Pattern: Impaired Gait Pattern: Step-through pattern (increased B UE support on RW) Gait velocity: decreased Stairs / Additional Locomotion Stairs: Yes Stairs Assistance: Contact Guard/Touching assist Stair Management Technique: Two rails Number of Stairs: 12 Height of Stairs: 6 Ramp: Contact Guard/touching assist Curb: Minimal Assistance - Patient >75% Wheelchair Mobility Wheelchair Mobility: No  Trunk/Postural Assessment  Cervical Assessment Cervical Assessment: Within Functional Limits Thoracic Assessment Thoracic Assessment: Exceptions to [facility] (rounded shoulders) Lumbar Assessment Lumbar Assessment: Within Functional Limits Postural Control Postural Control: Deficits on evaluation Protective Responses: continues to be unable to use ankle strategy to regain balance due to paralysis Postural Limitations: continues to be decreased due to LE weakness (specifically lack of ankle DF/PF activation) and need to use UE support on RW though improved since eval  Balance Balance Balance Assessed: Yes Static Sitting Balance Static Sitting - Level of Assistance: 7: Independent Dynamic Sitting Balance Dynamic Sitting - Balance Support: During functional activity Dynamic Sitting - Level of Assistance: 7: Independent;6: Modified independent (Device/Increase time) Static Standing Balance Static Standing - Balance Support: During functional activity;Bilateral upper extremity supported Static Standing - Level of Assistance: 6: Modified independent (Device/Increase time) Dynamic Standing Balance Dynamic Standing - Balance Support: During functional activity;Bilateral upper  extremity supported Dynamic Standing - Level of Assistance: 5: Stand by assistance;4: Min assist Extremity Assessment      RLE Assessment RLE Assessment: Exceptions to [facility] RLE Strength Right Hip Flexion: 4-/5 Right Knee Flexion: 3/5 Right Knee Extension: 4/5 Right Ankle Dorsiflexion: 0/5 Right Ankle Plantar Flexion: 0/5 RLE Tone RLE Tone: Hypotonic Hypotonic Details: in ankle PF and DF LLE Assessment LLE Assessment: Exceptions to [facility] LLE Strength Left Hip Flexion: 4-/5 Left Knee Flexion: 3/5 Left Knee Extension: 4/5 Left Ankle Dorsiflexion: 0/5 Left Ankle Plantar Flexion: 0/5 LLE Tone LLE Tone: Hypotonic Hypotonic Details: in ankle PF and DF  Tawana Scale , PT, DPT, CSRS 01/23/2020, 7:53 AM

## 2020-01-23 NOTE — Progress Notes (Signed)
Recreational Therapy Session Note  Patient Details  Name: KEYLIN PODOLSKY MRN: [id number] Date of Birth: [birth year] Today's Date: 01/23/2020  Pain: no c/o Skilled Therapeutic Interventions/Progress Updates: Session focused on community reintegration ambulatory level and family education in regards to community pursuits.  Transported pt to/from outside in w/c Total assist for time management and energy conservation.  Pt ambulated ~9ft using RW over brick and concrete paved surfaces.  Pt ambulated with contact guard assist provided by his family.  Education provided on energy conservation, accessing public restrooms and Insurance risk surveyor.  All stated understanding and had no further questions.  Therapy/Group: [employer]  Jeanenne Licea 01/23/2020, 3:57 PM

## 2020-01-23 NOTE — Progress Notes (Signed)
Occupational Therapy Discharge Summary  Patient Details  Name: Clifford Nichols MRN: [id number] Date of Birth: Sep 29, 1980  Today's Date: 01/23/2020 OT Individual Time: 3614-4315 OT Individual Time Calculation (min): 70 min   Session Note:  Pt's family present for education as well as during the am session earlier.  Therapist and spouse assisted with helping pt donn his AFOs and shoes to start.  He continues to need assist with getting his foot in the shoe and brace as well as tying it, but then he is able to fasten the calf strap.  Next, he ambulated down to the tub/shower room with use of the RW with supervision and completed tub transfer with use of the tub bench.  Family was able to observe and therapist re-emphasized the sequence of task by working on bathing and drying off while sitting on the seat, then transferring out to dry off his feet with use of the reacher.  Throw rugs were to be removed and a towel could be placed on the floor if needed to assist with drying his feet, but then removed before trying to stand.  They all voiced understanding.  Also discussed the benefit of having a hand held shower as well as they will look to purchase both the shower bench and the hand held shower from outside sources.  Next, had pt ambulate to the therapy gym where he worked on [employer] with use of the green therapy band.  Handout was issued and he was able to return demonstrate all AAROM exercises for the left shoulder using a dowel rod (flexion, abduction).  He completed AROM shoulder external rotation in sidelying for the left shoulder for 1 set of 10 reps.  Therapy band was used for completion of elbow flexion and extension bilaterally and shoulder row for 1 set of 20 reps each.  Finished session with ambulation back to the room and pt left sitting EOB with family present and call button and phone in reach.  He was able to remove his shoes and AFOs with use of the reacher and supervision.  Patient  has met 9 of 10 long term goals due to improved activity tolerance, improved balance and ability to compensate for deficits.  Patient to discharge at overall Supervision level.  Patient's care partner is independent to provide the necessary physical assistance at discharge.    Reasons goals not met: Pt needs mod assist for donning shoes and AFOs bilaterally  Recommendation:  Patient will benefit from ongoing skilled OT services in outpatient setting to continue to advance functional skills in the area of BADL, iADL and Reduce care partner burden.  Pt will benefit from continued OT to progress ADL performance to a modified independent level and increased overall endurance.  Feel he will also benefit from continued LUE shoulder strengthening and functional use.    Equipment: RW  Reasons for discharge: treatment goals met and discharge from hospital  Patient/family agrees with progress made and goals achieved: Yes  OT Discharge Precautions/Restrictions  Precautions Precautions: Fall Precaution Comments: monitor O2 and HR Restrictions Weight Bearing Restrictions: No  Pain Pain Assessment Pain Scale: Faces Pain Score: 0-No pain ADL ADL Eating: Independent Where Assessed-Eating: Edge of bed Grooming: Independent Where Assessed-Grooming: Edge of bed Upper Body Bathing: Setup Where Assessed-Upper Body Bathing: Shower, Chair Lower Body Bathing: Supervision/safety Where Assessed-Lower Body Bathing: Chair, Shower Upper Body Dressing: Supervision/safety Where Assessed-Upper Body Dressing: Edge of bed Lower Body Dressing: Moderate assistance Where Assessed-Lower Body Dressing: Edge of bed Toileting:  Supervision/safety Where Assessed-Toileting: Glass blower/designer: Close supervision Toilet Transfer Method: Counselling psychologist: Raised toilet seat Tub/Shower Transfer: Close supervison Clinical cytogeneticist Method: Optometrist: Midwife Baseline Vision/History: No visual deficits Patient Visual Report: No change from baseline Vision Assessment?: No apparent visual deficits Perception  Perception: Within Functional Limits Praxis Praxis: Intact Cognition Overall Cognitive Status: Within Functional Limits for tasks assessed Arousal/Alertness: Awake/alert Attention: Focused;Sustained Focused Attention: Appears intact Sustained Attention: Appears intact Selective Attention: Appears intact Memory: Appears intact Problem Solving: Appears intact Safety/Judgment: Appears intact Sensation Sensation Hot/Cold: Appears Intact Proprioception: Appears Intact Stereognosis: Appears Intact Additional Comments: Sensation intact in BUEs Coordination Gross Motor Movements are Fluid and Coordinated: Yes Fine Motor Movements are Fluid and Coordinated: Yes Coordination and Movement Description: Coordination WFLs for BUEs Motor  Motor Motor - Discharge Observations: Generalized weakness with more impaired B LE strength though improved since evaluation (still no ankle PF/DF muscle activation) Mobility  Bed Mobility Bed Mobility: Supine to Sit;Sit to Supine Rolling Right: Independent Rolling Left: Independent Supine to Sit: Independent Sit to Supine: Independent Transfers Sit to Stand: Independent with assistive device Stand to Sit: Independent with assistive device  Trunk/Postural Assessment  Cervical Assessment Cervical Assessment: Within Functional Limits Thoracic Assessment Thoracic Assessment: Exceptions to [facility] (thoracic rounding) Lumbar Assessment Lumbar Assessment: Within Functional Limits  Balance Balance Balance Assessed: Yes Static Sitting Balance Static Sitting - Balance Support: Feet supported Static Sitting - Level of Assistance: 7: Independent Dynamic Sitting Balance Dynamic Sitting - Balance Support: During functional activity Dynamic Sitting - Level of Assistance: 7: Independent Static  Standing Balance Static Standing - Balance Support: During functional activity;Bilateral upper extremity supported Static Standing - Level of Assistance: 6: Modified independent (Device/Increase time) Dynamic Standing Balance Dynamic Standing - Balance Support: During functional activity;Left upper extremity supported;Right upper extremity supported Dynamic Standing - Level of Assistance: 5: Stand by assistance Extremity/Trunk Assessment RUE Assessment RUE Assessment: Within Functional Limits Passive Range of Motion (PROM) Comments: WFLS Active Range of Motion (AROM) Comments: WFLs General Strength Comments: shoulder 4-/5, elbow flexion/extension and grip WFLs LUE Assessment LUE Assessment: Exceptions to [facility] Active Range of Motion (AROM) Comments: WFLs General Strength Comments: shoulder flexion still at 3-3+/5 with weak external rotators at 2+/5.  All other joints 4+/5 throughout.  Pt with pain in the ac joint with palpation and history of shoulder injury that needs to be furtrher evaluated for possible rotator cuff tear.   Correy Weidner OTR/L 01/23/2020, 4:27 PM

## 2020-01-24 LAB — GLUCOSE, CAPILLARY: Glucose-Capillary: 104 mg/dL — ABNORMAL HIGH (ref 70–99)

## 2020-01-24 NOTE — Progress Notes (Signed)
Clifford Nichols PHYSICAL MEDICINE & REHABILITATION PROGRESS NOTE   Subjective/Complaints: Excited about D/C , family training completed  Discussed with pt eligibility for handicap sticker  ROS - Pt denies SOB, abd pain, CP, N/V/C/D, and vision changes   Objective:   No results found. No results for input(s): WBC, HGB, HCT, PLT in the last 72 hours. No results for input(s): NA, K, CL, CO2, GLUCOSE, BUN, CREATININE, CALCIUM in the last 72 hours.  Intake/Output Summary (Last 24 hours) at 01/24/2020 0634 Last data filed at 01/24/2020 0456 Gross per 24 hour  Intake 480 ml  Output 550 ml  Net -70 ml     Physical Exam: Vital Signs Blood pressure 117/76, pulse 99, temperature 98 F (36.7 C), temperature source Oral, resp. rate 18, weight (!) 153.3 kg, SpO2 94 %.  General: No acute distress Mood and affect are appropriate  Lungs: Clear to auscultation, breathing unlabored, no rales or wheezes Abdomen: Positive bowel sounds, soft nontender to palpation, nondistended Extremities: No clubbing, cyanosis, or edema SKin- sacral decubitus with dressing   Assessment/Plan: 1. Functional deficits secondary to Critical illness polyneuropathy   Stable for D/C today F/u PCP in 3-4 weeks F/u PM&R 4 weeks See D/C summary See D/C instructions  Care Tool:  Bathing    Body parts bathed by patient: Right arm, Left arm, Chest, Abdomen, Front perineal area, Right upper leg, Left upper leg, Face, Right lower leg, Left lower leg, Buttocks   Body parts bathed by helper: Buttocks     Bathing assist Assist Level: Supervision/Verbal cueing     Upper Body Dressing/Undressing Upper body dressing   What is the patient wearing?: Pull over shirt    Upper body assist Assist Level: Set up assist    Lower Body Dressing/Undressing Lower body dressing      What is the patient wearing?: Pants     Lower body assist Assist for lower body dressing: Supervision/Verbal cueing     Toileting Toileting     Toileting assist Assist for toileting: Supervision/Verbal cueing (use of toilet aide for hygiene) Assistive Device Comment: to bathroom   Transfers Chair/bed transfer  Transfers assist     Chair/bed transfer assist level: Independent with assistive device Chair/bed transfer assistive device: Walker, Other (B AFOs)   Locomotion Ambulation   Ambulation assist   Ambulation activity did not occur: Safety/medical concerns (required use of RW (does not use AD baseline))  Assist level: Supervision/Verbal cueing Assistive device: Walker-rolling Max distance: 150'   Walk 10 feet activity   Assist  Walk 10 feet activity did not occur: Safety/medical concerns  Assist level: Supervision/Verbal cueing Assistive device: Walker-rolling   Walk 50 feet activity   Assist Walk 50 feet with 2 turns activity did not occur: Safety/medical concerns  Assist level: Supervision/Verbal cueing Assistive device: Walker-rolling    Walk 150 feet activity   Assist Walk 150 feet activity did not occur: Safety/medical concerns  Assist level: Supervision/Verbal cueing Assistive device: Walker-rolling    Walk 10 feet on uneven surface  activity   Assist Walk 10 feet on uneven surfaces activity did not occur: Safety/medical concerns   Assist level: Contact Guard/Touching assist (ramp) Assistive device: Photographer Will patient use wheelchair at discharge?: No             Wheelchair 50 feet with 2 turns activity    Assist            Wheelchair 150 feet activity  Assist          Blood pressure 117/76, pulse 99, temperature 98 F (36.7 C), temperature source Oral, resp. rate 18, weight (!) 153.3 kg, SpO2 94 %.  Medical Problem List and Plan: 1.   Reduced mobility and self-care skills secondary to critical illness polyneuropathy  D/C 7/2  PMR f/u 1 month post d/c 2.  Antithrombotics: -DVT/anticoagulation:Pharmaceutical:Lovenox80mg  QD per Pharm, D/C in am  -antiplatelet therapy: N/A 3. Pain Management:ON oxycodone 10 mg every 4 hours--denies any issues with pain. Attempt to start wean over next few days. Increased  Gabapentin 600mg   TID    6/30: tolerating Cymbalta well.  4. Mood:LCSW to follow for evaluation and support. -antipsychotic agents: N/A 5. Neuropsych: This patientiscapable of making decisions onhisown behalf. 6.Sacral decub/Skin/Wound Care:Hydrotherapy to sacral wound 5 X week with santyl wet to dry dressing changes. Continue Zinc and vitamin C. Will add protein supplement to promote healing. 7. Fluids/Electrolytes/Nutrition:Monitor I/O. Check lytes in am.  8. HTN: Monitor BP tid--controlled without meds. Vitals:   01/23/20 2012 01/24/20 0453  BP: 120/69 117/76  Pulse: 92 99  Resp: 18 18  Temp: 98.9 F (37.2 C) 98 F (36.7 C)  SpO2: 95% 94%   7/2 controlled  9. Covid PNA with hypoxemic respiratory failure:Keep HOB >35 degrees at nights.On 5 L oxygen per ATC.- also with chronic sleep apnea, 6/27- pt decannulated SELF 6/25! 10. New diagnosis T2DM: Hgb A1C-6.6. Will transition from levemir to oral hypoglycemic regimen. Change diet to CM. Continue to monitor BS ac/hs and titrate medication as indicated.  CBG (last 3)  Recent Labs    01/23/20 1709 01/23/20 2113 01/24/20 0621  GLUCAP 99 99 104*    7/2: well controlled. Cont Metformin 11. Anemia of critical illness: Is improving from drop to 7.7-->9.6.  6/28: Hgb up to 10.4 12. Morbid obesity- BMI 54.5: Educated on weight loss and healthy diet. 13. OSA: Has had study years ago but could not afford CPAP per mother. Will need sleep study after discharge. 14. Resting Tachycardia: Likely due to debility--HR in 110-120.If BP is up can add BB  Monitor triggering and keeping pt awake, will ask nsg to change setting , looks like monitor triggering  at 100bpm, should move up to 110   6/26- will either change monitor setting or stop- since O2 sats look great- >93% in last 24+ hours.  6/27- slept better- vitals look great    LOS: 11 days A FACE TO FACE EVALUATION WAS PERFORMED  7/27 01/24/2020, 6:34 AM

## 2020-01-24 NOTE — Progress Notes (Signed)
Recreational Therapy Discharge Summary Patient Details  Name: Clifford Nichols MRN: [id number] Date of Birth: 25-Jul-1981 Today's Date: 01/24/2020  Long term goals set: 1  Long term goals met: 1  Comments on progress toward goals: Pt has made excellent progress during LOS and is discharging home with family to provide 24 hour supervision/assistance.  TR sessions focused on activity tolerance, activity analysis identifying potential modifications, energy conservation & community reintegration.  Pt is discharging at [place] assist level for community mobility.    Reasons goals not met: n/a  Equipment acquired: n/a  Reasons for discharge: discharge from hospital  Patient/family agrees with progress made and goals achieved: Yes  [name] 01/24/2020, 10:32 AM

## 2020-01-24 NOTE — Progress Notes (Signed)
Inpatient Rehabilitation Care Coordinator  Discharge Note  The overall goal for the admission was met for:   Discharge location: Yes. home  Length of Stay: Yes, 11 Days   Discharge activity level: Yes, ambulatory supervision   Home/community participation: Yes  Services provided included: MD, RD, PT, OT, SLP, RN, CM, TR, Pharmacy and SW  Financial Services: Private Insurance: [place] NON-PARTICIPATING  Follow-up services arranged: Outpatient: [facility] (or additional information): PT OT  Patient/Family verbalized understanding of follow-up arrangements: Yes  Individual responsible for coordination of the follow-up plan: self [phone]  Confirmed correct DME delivered: Dyanne Iha 01/24/2020    Dyanne Iha

## 2020-01-24 NOTE — Plan of Care (Signed)
Patient discharged via wheelchair by staff, voices understanding of discharge instructions

## 2020-01-27 DIAGNOSIS — G4733 Obstructive sleep apnea (adult) (pediatric): Secondary | ICD-10-CM

## 2020-01-27 NOTE — Discharge Summary (Signed)
Physician Discharge Summary  Patient ID: Clifford Nichols MRN: [id number] DOB/AGE: 04-09-1981 39 y.o.  Admit date: 01/13/2020 Discharge date: 01/24/2020  Discharge Diagnoses:  Principal Problem:   Critical illness polyneuropathy (HCC) Active Problems:   HTN (hypertension)   Diabetes (HCC)   Pressure injury of skin   Debility   Morbid obesity with BMI of 50.0-59.9, adult (HCC)   OSA (obstructive sleep apnea)   Discharged Condition: stable   Significant Diagnostic Studies: N/A   Labs:  Basic Metabolic Panel: BMP Latest Ref Rng & Units 01/20/2020 01/14/2020 01/13/2020  Glucose 70 - 99 mg/dL 284(X) 324(M) 010(U)  BUN 6 - 20 mg/dL 9 10 17   Creatinine 0.61 - 1.24 mg/dL 7.25 3.66  Sodium 135 - 145 mmol/L 137 141 139  Potassium 3.5 - 5.1 mmol/L 4.3 3.9 3.5  Chloride 98 - 111 mmol/L 102 106 103  CO2 22 - 32 mmol/L 24 26 25   Calcium 8.9 - 10.3 mg/dL 9.0 9.1 4.40)    CBC: CBC Latest Ref Rng & Units 01/20/2020 01/14/2020 01/13/2020  WBC 4.0 - 10.5 K/uL 6.3 6.9 7.8  Hemoglobin 13.0 - 17.0 g/dL 10.4(L) 10.1(L) 9.6(L)  Hematocrit 39 - 52 % 35.2(L) 34.2(L) 32.0(L)  Platelets 150 - 400 K/uL 182 202 197    CBG: Recent Labs  Lab 01/23/20 0608 01/23/20 1141 01/23/20 1709 01/23/20 2113 01/24/20 0621  GLUCAP 107* 105* 99 99 104*    Brief HPI:   Clifford Nichols is a 39 y.o. male with history of HTN-no meds times couple of months, untreated OSA, morbid obesity--BMI 52.9 who was admitted on 12/02/2019 with acute respiratory failure and ARDS from COVID-19 PNA. He required intubation and was treated with Tocilizumab, remdesivir, Decadron as well as broad-spectrum antibiotics. He was not felt to be a candidate for ECMO due to markedly elevated BMI with patient with proning as well as ketamine. Hospital course significant for septic shock as well as HCAP treated with multiple antibiotics. He continued to have ongoing fevers and positive blood cultures felt to be due to contaminant and  antibiotics eventually discontinued by 06/01. He was found to have intermittent left proptosis and CT of head was negative period wife reported this was a chronic problem and patient to follow-up with ophthalmology past discharge.  He did have acute blood loss anemia with thrombocytopenia requiring 2 units PRBCs, required tracheostomy 05/28 and was extubated to ATC. He was briefly downsized to a cuffless trach but changed back to cuffed #6 XLT due to need for TTS vent at night. He was weaned off BiPAP and respiratory status stable on 5 L oxygen.  Stage III sacral decub was being treated with hydrotherapy. Pain bilateral feet was felt to be due to plantar fasciitis and treated with oxycodone as needed. He is tolerating clears and diet was advanced to regular with thin liquids. He is tolerating PMV trials with improvement in activity tolerance. Therapy ongoing and CIR was recommended due to critical illness polyneuropathy with functional decline.   Hospital Course: Clifford Nichols was admitted to rehab 01/13/2020 for inpatient therapies to consist of PT, ST and OT at least three hours five days a week. Past admission physiatrist, therapy team and rehab RN have worked together to provide customized collaborative inpatient rehab. Blood pressures were monitored on TID basis and has been stable. Resting tachycardia due to debility is improving and respiratory status is stable. PCCM has followed for input and trach was downsized to CFS # 4 on 06/24. Patient self decannulated  on 06/25 and respiratory status has been stable. He has been compliant with CPAP use during his hospitalization and  has been referred to pulmonary clinic for sleep study in order to qualify for CPAP.  Gabapentin was titrated up to 600 mg 3 times daily. Cymbalta was added and titrated up to 60 mg to manage nerve pain. Hydrotherapy was ongoing with good results and WOCN nurse consulted for input on 06/23. As sacral wound has improved to 6.3 x 2.5  x 2.1 cm with beefy red granulation tissue and no odor present; dressings were changed to Xeroform gauze with foam dressing to be changed daily. Air mattress overlay as well as prone position and boosting has been used for pressure relief measures. Multiple protein supplements including Juven and vitamins have been on board to promote wound healing.   Diabetes has been monitored with ac/hs CBG checks and SSI was use prn for tighter BS control. Levemir was discontinued and Metformin added to manage blood sugars.Follow-up CBC shows acute blood loss anemia is resolving. Serial check of BMET revealed Lytes and renal status to be WNL.  AFO's ordered for bilateral foot drop and to help with gait. He has been weaned off oxygen. His endurance levels have improved and he has been making good progress during his rehab stay. He currently requires supervision and will continue to receive outpatient PT and OT at [facility] rehab after discharge.     Rehab course: During patient's stay in rehab weekly team conferences were held to monitor patient's progress, set goals and discuss barriers to discharge. At admission, patient required mod assist with basic ADL task and with mobility. He has had improvement in activity tolerance, balance, postural control as well as ability to compensate for deficits. He is able to complete ADL tasks with supervision and has been educated on BUE strengthening exercises. He requires mod assist to don shoes and B-AFOs. He is modified independent for transfers. He is able to ambulate 200 ft with very-RW with supervision for safety. He requires contact-guard assist to navigate ramp. Family education completed regarding all aspects of safety, energy conservation measures as well as assistance as needed.Marland Kitchen  Discharge disposition: 01-Home or Self Care  Diet: Carb modified.  Special Instructions: 1. No driving or strenuous activity. 2. Cleanse sacral wound with normal saline. Pat dry gently Xeroform  gauze on the wound bed and cover with foam dressing. Change daily.   Discharge Instructions    Ambulatory referral to Physical Medicine Rehab   Complete by: As directed    Follow-up 1 month critical illness polyneuropathy   Ambulatory referral to Pulmonology   Complete by: As directed    Post covid/OSA- needs sleep study. For discharge 7/6   Reason for referral: Other   Trach visit   Complete by: Jan 30, 2020    Post covid trach/untreated OSA     Allergies as of 01/24/2020   No Known Allergies     Medication List    STOP taking these medications   insulin aspart 100 UNIT/ML injection Commonly known as: NovoLOG     TAKE these medications   acetaminophen 325 MG tablet Commonly known as: TYLENOL Take 1-2 tablets (325-650 mg total) by mouth every 4 (four) hours as needed for mild pain.   ascorbic acid 500 MG tablet Commonly known as: VITAMIN C Take 1 tablet (500 mg total) by mouth daily.   bisacodyl 10 MG suppository Commonly known as: DULCOLAX Place 1 suppository (10 mg total) rectally daily as needed for  moderate constipation.   DULoxetine 60 MG capsule Commonly known as: CYMBALTA Take 1 capsule (60 mg total) by mouth daily.   gabapentin 300 MG capsule Commonly known as: NEURONTIN Take 2 capsules (600 mg total) by mouth 3 (three) times daily. What changed: how much to take   melatonin 3 MG Tabs tablet Take 1 tablet (3 mg total) by mouth at bedtime.   metFORMIN 500 MG tablet Commonly known as: GLUCOPHAGE Take 1 tablet (500 mg total) by mouth daily with breakfast.   multivitamin with minerals Tabs tablet Take 1 tablet by mouth daily.   Oxycodone HCl 10 MG Tabs--Rx # 30 pills Take 1 tablet (10 mg total) by mouth every 4 (four) hours as needed for severe pain.   pantoprazole 40 MG tablet Commonly known as: Protonix Take 1 tablet (40 mg total) by mouth daily.   polyethylene glycol 17 g packet Commonly known as: MIRALAX / GLYCOLAX Take 17 g by mouth daily as  needed for mild constipation.   zinc sulfate 220 (50 Zn) MG capsule Take 1 capsule (220 mg total) by mouth daily.       Follow-up Information    Kirsteins, Victorino Sparrow, MD Follow up.   Specialty: Physical Medicine and Rehabilitation Why: Office to call for appointment Contact information: [address] [place] [address] [phone]        Leslye Peer, MD Follow up.   Specialty: Pulmonary Disease Why: call for appointment Contact information: [address] [place] [address] [phone]               Signed: Jacquelynn Cree 01/27/2020, 1:55 PM

## 2020-01-28 ENCOUNTER — Other Ambulatory Visit: Payer: Self-pay | Admitting: *Deleted

## 2020-01-28 ENCOUNTER — Telehealth: Payer: Self-pay | Admitting: Physical Medicine & Rehabilitation

## 2020-01-28 NOTE — Telephone Encounter (Signed)
Would like PT in [facility] like [facility]. Has questions regarding wound care/medication to apply to wound and make sure it is healing properly.

## 2020-01-28 NOTE — Patient Outreach (Signed)
[facility]) Care Management  01/28/2020  [name] 1980-09-09 [id number]   Transition of care telephone call  Referral received:01/24/20 Initial outreach:01/28/20 Insurance: [facility]   Initial unsuccessful telephone call to patient's preferred number in order to complete transition of care assessment; no answer, left HIPAA compliant voicemail message requesting return call.   Objective: Per the electronic medical record, Mr. Clifford Nichols  was hospitalized at [facility] 5/10-5/12 for Acute Respiratory failure Covid 19 he transferred to [facility] on 5/12-6/21 Dx ARDS,Acute on chronic respiratory failure, s/p trach. He was then transferred to inpatient rehab at [facility] 6/21-7/2 Critical illness polyneuropathy, Obstructive sleep Apnea , diabetes, hypertension  . Comorbidities include: Morbid obesity , Hypertension  He was discharged to home on 01/24/20  without the need for home health services, he has referral to outpatient physical and occupational therapy. Durable medical equipment ordered per Discharge visit summary, Bari Rolling walker and oxygen concentrator.   Plan: This RNCM will route unsuccessful outreach letter with [facility] Care Management pamphlet and 24 hour Nurse Advice Line Magnet to [employer] Care Management clinical pool to be mailed to patient's home address. This RNCM will attempt another outreach within 4 business days.   Egbert Garibaldi, RN, BSN  [facility] Care Management,Care Management Coordinator  [phone]- Mobile [phone]- Toll Free Main Office

## 2020-01-28 NOTE — Telephone Encounter (Signed)
Okay to have physical therapy at [facility] please send order

## 2020-01-29 ENCOUNTER — Telehealth: Payer: Self-pay | Admitting: Registered Nurse

## 2020-01-29 NOTE — Telephone Encounter (Signed)
error 

## 2020-01-29 NOTE — Telephone Encounter (Signed)
TC call placed, no answer. Left message to return the call.  

## 2020-01-31 ENCOUNTER — Other Ambulatory Visit: Payer: Self-pay | Admitting: *Deleted

## 2020-01-31 NOTE — Patient Outreach (Signed)
[facility]) Care Management  01/31/2020  Clifford Nichols [birth year] [id number]   Transition of care call/case closure   Referral received: 01/24/20 Initial outreach:01/28/20 Insurance: [place] [employer]    Subjective: On 2nd call attempt successful telephone call to patient's preferred number in order to complete transition of care assessment; 2 HIPAA identifiers verified. Explained purpose of call and completed transition of care assessment.  Clifford Nichols states that he is doing wonderful. He discussed tolerating mobility in home with walker and assist. He denies shortness of breath, states using oxygen only as needed. He states that he is sleeping well at night.  He states that his wife is assisting with personal care, and has supplies for  changing dressing to sacral area reporting healing and site getting smaller. He reports tolerating diet, watching his carbohydrates , states he wife/mom is helping due and has knowledge of what to eat. tolerating diet  Spouse/children/mother  are assisting with his recovery.  He discussed new diagnosis of Diabetes  any ongoing health issues and says he is agreeable to  a referral to one of the [place] chronic disease management programs.Patient to discuss with PCP today regarding monitoring blood sugar and need for at meter at visit today. Encouraged to have staff review wound site at office visit on today.  He does not have hospital indemnity plan, states that he has made contact regarding FMLA and awaiting additional paper work, provided [facility] Benefit number [id number] [employer].  Clifford Nichols gave verbal consent that we may speak with his wife Clifford Nichols if needed.  He says he does not  Use a [facility] outpatient pharmacy.     Objective:  Clifford Nichols  was hospitalized at [facility] 5/10-5/12 for Acute Respiratory failure Covid 19 he transferred to [facility] on 5/12-6/21 Dx ARDS,Acute on chronic respiratory failure, s/p trach. He was then transferred  to inpatient rehab at [facility] 6/21-7/2 Critical illness polyneuropathy, Obstructive sleep Apnea , diabetes, hypertension  . Comorbidities include: Morbid obesity , Hypertension  He was discharged to home on 01/24/20  without the need for home health services, he has referral to outpatient physical and occupational therapy. Durable medical equipment ordered per Discharge visit summary, Bari Rolling walker and oxygen concentrator.    Assessment:  Patient voices good understanding of all discharge instructions.  See transition of care flowsheet for assessment details.   Plan:  Reviewed hospital discharge diagnosis of Covid 19 virus, ARDS, polyneuropathy    and discharge treatment plan using hospital discharge instructions, assessing medication adherence, reviewing problems requiring provider notification, and discussing the importance of follow up with surgeon, primary care provider and/or specialists as directed. Reinforced importance of follow up recommendations with Pulmonary regarding sleep apnea and recent trach history, and outpatient rehab, offered to make calls follow up on appointments patient states that he and his wife will be able to do it.  Reviewed [place] healthy lifestyle program information to receive discounted premium for  2022   Step 1: Get  your annual physical  Step 2: Complete your health assessment  Step 3:Identify your current health status and complete the corresponding action step between January 1, and March 25, 2020.    Using [employer] Active[employer] View website, with patient agreement to enroll to  participate in [place] [employer] chronic disease management program.     Plan Will plan return call in the next week regarding follow up on with Pulmonary visit and OP Rehab for PT/OT referrals.  Egbert Garibaldi, RN, BSN  [facility] Care Management,Care Management Coordinator  [phone]- Mobile [phone]- Toll Free Main Office

## 2020-02-03 LAB — ACID FAST CULTURE WITH REFLEXED SENSITIVITIES (MYCOBACTERIA): Acid Fast Culture: NEGATIVE

## 2020-02-04 ENCOUNTER — Other Ambulatory Visit: Payer: Self-pay | Admitting: *Deleted

## 2020-02-04 NOTE — Patient Outreach (Signed)
[facility]) Care Management  02/04/2020  Clifford Nichols 07-27-1980 [id number]   Care Coordination Call    Placed call to [facility], to verify referral received, spoke with receptionist that verifies that they have referral and will be contacting patient regarding setting up appointments.    Plan Will follow up with patient at scheduled call this week.    Egbert Garibaldi, RN, BSN  [facility] Care Management,Care Management Coordinator  [phone]- Mobile [phone]- Toll Free Main Office

## 2020-02-07 ENCOUNTER — Other Ambulatory Visit: Payer: Self-pay | Admitting: *Deleted

## 2020-02-07 NOTE — Patient Outreach (Signed)
[facility]) Care Management  02/07/2020  Clifford Nichols Jan 11, 1981 [id number]   Transition of care call/follow up call   Referral received: 01/24/20 Initial outreach:01/28/20 Insurance: [place] [employer]   Subjective: Successful follow up call to patient, he reports that he is doing well. He discussed visit with PCP on last week, states his sacral  wound was observed and states healing. Patient denies shortness of breath, states trach site well healed. He reports using his oxygen mostly at night for sleep.  Patient states that he has not received call regarding outpatient physical therapy yet, discussed with patient call I placed to [facility] outpatient therapy service, verified that they have referral and will be contacting him. Patient reports continuing to use walker tolerating mobility in home and working on exercises .    Objective   Mr. Youngwas hospitalized at [facility] 5/10-5/12 for Acute Respiratory failure Covid 19 he transferred to [facility] on 5/12-6/21 Dx ARDS,Acute on chronic respiratory failure, s/p trach. He was then transferred to inpatient rehab at [facility] 6/21-7/2 Critical illness polyneuropathy, Obstructive sleep Apnea , diabetes, hypertension. Comorbidities include: Morbid obesity , HypertensionHe was discharged to home on 7/2/21without the need for home health services, he has referral to outpatient physical and occupational therapy.Durable medical equipmentordered per Discharge visit summary, Bari Rolling walker and oxygen concentrator.   Plan Will plan follow up call in the next week and plan case closure if no new care coordination needs identified.    Egbert Garibaldi, RN, BSN  [facility] Care Management,Care Management Coordinator  [phone]- Mobile [phone]- Toll Free Main Office

## 2020-02-10 ENCOUNTER — Encounter: Payer: Self-pay | Admitting: Registered Nurse

## 2020-02-10 ENCOUNTER — Encounter: Payer: No Typology Code available for payment source | Attending: Registered Nurse | Admitting: Registered Nurse

## 2020-02-10 ENCOUNTER — Other Ambulatory Visit: Payer: Self-pay

## 2020-02-10 VITALS — BP 129/87 | HR 106 | Temp 98.3°F | Ht 66.0 in | Wt 342.0 lb

## 2020-02-10 DIAGNOSIS — U071 COVID-19: Secondary | ICD-10-CM | POA: Diagnosis present

## 2020-02-10 DIAGNOSIS — I10 Essential (primary) hypertension: Secondary | ICD-10-CM | POA: Diagnosis not present

## 2020-02-10 DIAGNOSIS — Z6841 Body Mass Index (BMI) 40.0 and over, adult: Secondary | ICD-10-CM | POA: Insufficient documentation

## 2020-02-10 DIAGNOSIS — G6281 Critical illness polyneuropathy: Secondary | ICD-10-CM | POA: Diagnosis not present

## 2020-02-10 DIAGNOSIS — R5381 Other malaise: Secondary | ICD-10-CM | POA: Diagnosis not present

## 2020-02-10 NOTE — Progress Notes (Signed)
Subjective:    Patient ID: Clifford Nichols, male    DOB: 16-Jul-1981, 39 y.o.   MRN: [id number]  HPI: Clifford Nichols is a 39 y.o. male who is here for HFU appointment of his Critical Illness Polyneuropathy, Debility, COVID-19, Essential Hypertension and Morbid Obesity. [name] was admitted to [facility] on 12/02/2019 with acute respiratory failure due to  COVID-19 pneumonia. He had a long Hospital Stay, see discharge summary for details Dr. Thedore Mins, Dr Clair Gulling PA-C.   Chest X-Ray:  IMPRESSION: Prominence of the cardiac silhouette, although accentuated on this shallow inspiration radiograph.  Central pulmonary vascular congestion.  Opacities within the mid to lower lung fields bilaterally are nonspecific, but may reflect edema or pneumonia.  CT Head WO Contrast:  IMPRESSION: 1. No significant intracranial abnormality is observed. 2. Opacification and air-fluid levels scattered in the frontal, ethmoid, sphenoid, and maxillary sinuses. This could be from acute sinusitis but may also simply be related to the patient's Intubation.  Mr. Clifford Nichols was admitted to Inpatient Rehabilitation on 01/13/2020 and discharged home on 01/24/2020. He is not receiving outpatient therapy at [facility], [name] states his PCP is trying to find him Home Health Therapy. This provider placed a call to Outpatient Rehabilitation at [facility], they will be calling Mr. Clifford Nichols to schedule appointment, this provider placed a call to Mr. Dupuis regarding the above, he verbalizes understanding.   He states he has pain in his bilateral feet with tingling and burning. He rates his pain 8. Also reports he has a good appetite.   Pain Inventory Average Pain 8 Pain Right Now 8 My pain is sharp, burning, stabbing, tingling and aching  In the last 24 hours, has pain interfered with the following? General activity 3 Relation with others 0 Enjoyment of life 4 What TIME of day  is your pain at its worst? night Sleep (in general) Fair  Pain is worse with: inactivity Pain improves with: medication Relief from Meds: 6  Mobility walk with assistance use a walker ability to climb steps?  yes do you drive?  no  Function disabled: date disabled .  Neuro/Psych trouble walking  Prior Studies hospital f/u  Physicians involved in your care hospital f/u   Family History  Problem Relation Age of Onset  . Hypertension Father    Social History   Socioeconomic History  . Marital status: Married    Spouse name: Not on file  . Number of children: Not on file  . Years of education: Not on file  . Highest education level: Not on file  Occupational History  . Not on file  Tobacco Use  . Smoking status: Former Smoker    Packs/day: 0.50    Types: Cigarettes    Quit date: 12/31/2017    Years since quitting: 2.1  . Smokeless tobacco: Never Used  Vaping Use  . Vaping Use: Never used  Substance and Sexual Activity  . Alcohol use: Not Currently  . Drug use: No  . Sexual activity: Yes  Other Topics Concern  . Not on file  Social History Narrative  . Not on file   Social Determinants of Health   Financial Resource Strain:   . Difficulty of Paying Living Expenses:   Food Insecurity:   . Worried About Programme researcher, broadcasting/film/video in the Last Year:   . Barista in the Last Year:   Transportation Needs:   . Freight forwarder (Medical):   Marland Kitchen  Lack of Transportation (Non-Medical):   Physical Activity:   . Days of Exercise per Week:   . Minutes of Exercise per Session:   Stress:   . Feeling of Stress :   Social Connections:   . Frequency of Communication with Friends and Family:   . Frequency of Social Gatherings with Friends and Family:   . Attends Religious Services:   . Active Member of Clubs or Organizations:   . Attends Banker Meetings:   Marland Kitchen Marital Status:    Past Surgical History:  Procedure Laterality Date  . IRRIGATION AND  DEBRIDEMENT SEBACEOUS CYST Right 07/11/2018   Procedure: EXCISION  SEBACEOUS CYST POSTERIOR SHOULDER AND MID-BACK;  Surgeon: Ancil Linsey, MD;  Location: [facility] ORS;  Service: General;  Laterality: Right;  . NO PAST SURGERIES     Past Medical History:  Diagnosis Date  . Abscess of upper back excluding scapular region   . Diabetes mellitus without complication (HCC)   . GERD (gastroesophageal reflux disease)    OCC-NO MEDS  . Hypertension   . Sebaceous cyst    BP 129/87   Pulse (!) 106   Temp 98.3 F (36.8 C)   Ht 5\' 6"  (1.676 m)   Wt (!) 342 lb (155.1 kg)   SpO2 94%   BMI 55.20 kg/m   Opioid Risk Score:   Fall Risk Score:  `1  Depression screen PHQ 2/9  Depression screen PHQ 2/9 02/10/2020  Decreased Interest 0  Down, Depressed, Hopeless 0  PHQ - 2 Score 0  Altered sleeping 1  Tired, decreased energy 0  Change in appetite 0  Feeling bad or failure about yourself  0  Trouble concentrating 0  Moving slowly or fidgety/restless 0  Suicidal thoughts 0  PHQ-9 Score 1    Review of Systems  Constitutional: Negative.   HENT: Negative.   Eyes: Negative.   Respiratory: Negative.   Cardiovascular: Negative.   Gastrointestinal: Negative.   Endocrine: Negative.   Genitourinary: Negative.   Musculoskeletal: Positive for gait problem.  Skin: Negative.   Allergic/Immunologic: Negative.   Hematological: Negative.   Psychiatric/Behavioral: Negative.   All other systems reviewed and are negative.      Objective:   Physical Exam Vitals and nursing note reviewed.  Constitutional:      Appearance: Normal appearance.  Cardiovascular:     Rate and Rhythm: Normal rate and regular rhythm.     Pulses: Normal pulses.     Heart sounds: Normal heart sounds.  Pulmonary:     Effort: Pulmonary effort is normal.     Breath sounds: Normal breath sounds.  Musculoskeletal:     Cervical back: Normal range of motion and neck supple.     Comments: Normal Muscle Bulk and Muscle  Testing Reveals:  Upper Extremities: Full ROM and Muscle Strength 5/5  Lower Extremities : Full ROM and Muscle Strength 5/5 Arises from Table Slowly using walker for support Narrow Based Gait   Skin:    General: Skin is warm and dry.  Neurological:     Mental Status: He is alert and oriented to person, place, and time.  Psychiatric:        Mood and Affect: Mood normal.        Behavior: Behavior normal.           Assessment & Plan:  1. Critical Illness Polyneuropathy: Continue current medication regimen. Continue to Monitor. PCP Following.  2.  Debility: Placed a call to  Outpatient rehabilitation: They  will be calling Mr. Wassink to schedule appointment. He verbalizes Understanding. 3. 3.COVID-19> He has a HFU appointment with Pulmonary. Will continue to monitor.  4. Essential Hypertension: Blood Pressure Stable at this time. Continue to Monitor.  5. Diabetes Mellitus: Continue current Medication regimen. PCP Following . Continue to Monitor.  6. Sacral Wound. Has a scheduled appointment with wound care. At this time his wife is changing his dressing he reports.   7.Morbid Obesity. Continue with Healthy Diet Regimen and HEP as Tolerated. Continue to Monitor.   20 minutes of face to face patient care time was spent during this visit. All questions were encouraged and answered.  F/U with Dr Wynn Banker in 4-6 weeks

## 2020-02-13 ENCOUNTER — Other Ambulatory Visit: Payer: Self-pay | Admitting: *Deleted

## 2020-02-13 NOTE — Patient Outreach (Addendum)
[facility]) Care Management  02/13/2020  Clifford Nichols Nov 28, 1980 [id number]   Transition of care call/follow up call   Referral received:01/24/20 Initial outreach:01/28/20 Insurance: [place] [employer]  Subjective: Successful follow up call to patient, he reports that he is doing alright. He denies symptoms of shortness of breath, cough. He reports continuing to use oxygen mostly at night. He continues to use walker for mobility he denies falls, reinforced safety.  He discussed recent visit with Rehab doctor, he states wound on sacral area has closed and healing wife changing dressing,  and he has scheduled visit at wound center on 8/23. Discussed with patient whether he had received a call from outpatient rehab regarding outpatient therapy he denies, and states that his primary care provider had placed a referral also for rehab services. Discussed with patient importance of ongoing therapy for evaluation and treatment following recent hospital and rehab stay   Placed follow up call to outpatient services at [facility] spoke with representative Eunice Blase, she discussed contacting patient on yesterday and patient declined outpatient therapy stating that his primary care provider was ordering home health therapy and she sent message to care manager at rehab.   Debbie agreeable to reach out to patient again to clarify.   Objective   [facility]. Youngwas [facility]10[facility]12 for Acute Respiratory failure Covid 19 he transferred to [facility] on 5/12-6/21 Dx ARDS,Acute on chronic respiratory failure, s/p trach. He was then transferred to inpatient rehab at [facility] 6/21-7/2 Critical illness polyneuropathy, Obstructive sleep Apnea , diabetes, hypertension. Comorbidities include: Morbid obesity , HypertensionHe was discharged to home on 7/2/21without the need for home health services, he has referral to outpatient physical and occupational therapy.Durable medical equipmentordered  per Discharge visit summary, Bari Rolling walker and oxygen concentrator.  1600  Return call to patient he reports having recommended outpatient therapy appointment scheduled as well as outpatient wound center appointment. He will have family support with transportation . Reinforced attending all scheduled appointments. Reinforced worsening symptoms to seek medical attention for , shortness of breath, increased cough, elevated temperature or new concerns.  Patient reminded of being enrolled as he agreed to in Chronic disease management program with [employer].   Plan No ongoing care management coordination needs identified.  Will close case to [facility] care management services.    Egbert Garibaldi, RN, BSN  [facility] Care Management,Care Management Coordinator  [phone]- Mobile [phone]- Toll Free Main Office

## 2020-02-21 ENCOUNTER — Other Ambulatory Visit: Payer: Self-pay

## 2020-02-21 ENCOUNTER — Encounter: Payer: Self-pay | Admitting: Occupational Therapy

## 2020-02-21 ENCOUNTER — Ambulatory Visit: Payer: BLUE CROSS/BLUE SHIELD

## 2020-02-21 ENCOUNTER — Ambulatory Visit: Payer: BLUE CROSS/BLUE SHIELD | Attending: Physical Medicine & Rehabilitation | Admitting: Occupational Therapy

## 2020-02-21 DIAGNOSIS — R2689 Other abnormalities of gait and mobility: Secondary | ICD-10-CM | POA: Diagnosis present

## 2020-02-21 DIAGNOSIS — M6281 Muscle weakness (generalized): Secondary | ICD-10-CM

## 2020-02-21 DIAGNOSIS — R2681 Unsteadiness on feet: Secondary | ICD-10-CM
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
## 2020-02-21 NOTE — Patient Instructions (Signed)
Access Code: [facility] URL: [email or web address] Date: 02/21/2020 Prepared by: Precious Bard  Exercises Supine Lower Trunk Rotation - 1 x daily - 7 x weekly - 2 sets - 10 reps - 5 hold Abdominal Bracing - 1 x daily - 7 x weekly - 2 sets - 10 reps - 5 hold Seated Slump Nerve Glide - 1 x daily - 7 x weekly - 2 sets - 10 reps - 5 hold Standing Single Leg Stance with Unilateral Counter Support - 1 x daily - 7 x weekly - 2 sets - 2 reps - 20 hold

## 2020-02-25 ENCOUNTER — Ambulatory Visit: Payer: BLUE CROSS/BLUE SHIELD | Attending: Physical Medicine & Rehabilitation | Admitting: Occupational Therapy

## 2020-02-25 ENCOUNTER — Ambulatory Visit: Payer: BLUE CROSS/BLUE SHIELD | Admitting: Physical Therapy

## 2020-02-25 ENCOUNTER — Encounter: Payer: Self-pay | Admitting: Physical Therapy

## 2020-02-25 ENCOUNTER — Other Ambulatory Visit: Payer: Self-pay

## 2020-02-25 DIAGNOSIS — R278 Other lack of coordination: Secondary | ICD-10-CM | POA: Insufficient documentation

## 2020-02-25 DIAGNOSIS — J9621 Acute and chronic respiratory failure with hypoxia: Secondary | ICD-10-CM

## 2020-02-25 DIAGNOSIS — R2681 Unsteadiness on feet: Secondary | ICD-10-CM | POA: Diagnosis present

## 2020-02-25 DIAGNOSIS — G6281 Critical illness polyneuropathy: Secondary | ICD-10-CM | POA: Diagnosis present

## 2020-02-25 DIAGNOSIS — M6281 Muscle weakness (generalized): Secondary | ICD-10-CM

## 2020-02-25 DIAGNOSIS — J9622 Acute and chronic respiratory failure with hypercapnia: Secondary | ICD-10-CM | POA: Diagnosis present

## 2020-02-25 DIAGNOSIS — R2689 Other abnormalities of gait and mobility: Secondary | ICD-10-CM
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
## 2020-02-28 ENCOUNTER — Encounter: Payer: BLUE CROSS/BLUE SHIELD | Admitting: Speech Pathology

## 2020-02-28 ENCOUNTER — Ambulatory Visit: Payer: BLUE CROSS/BLUE SHIELD

## 2020-02-28 ENCOUNTER — Other Ambulatory Visit: Payer: Self-pay

## 2020-02-28 DIAGNOSIS — G6281 Critical illness polyneuropathy: Secondary | ICD-10-CM

## 2020-02-28 DIAGNOSIS — M6281 Muscle weakness (generalized): Secondary | ICD-10-CM | POA: Diagnosis not present

## 2020-02-28 DIAGNOSIS — R2681 Unsteadiness on feet: Secondary | ICD-10-CM

## 2020-02-28 DIAGNOSIS — J9621 Acute and chronic respiratory failure with hypoxia: Secondary | ICD-10-CM

## 2020-02-28 DIAGNOSIS — R2689 Other abnormalities of gait and mobility: Secondary | ICD-10-CM
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
## 2020-03-02 ENCOUNTER — Ambulatory Visit: Payer: BLUE CROSS/BLUE SHIELD

## 2020-03-04 ENCOUNTER — Encounter: Payer: Self-pay | Admitting: Occupational Therapy

## 2020-03-04 ENCOUNTER — Ambulatory Visit: Payer: BLUE CROSS/BLUE SHIELD | Admitting: Occupational Therapy

## 2020-03-04 ENCOUNTER — Ambulatory Visit: Payer: BLUE CROSS/BLUE SHIELD

## 2020-03-04 ENCOUNTER — Other Ambulatory Visit: Payer: Self-pay

## 2020-03-04 DIAGNOSIS — M6281 Muscle weakness (generalized): Secondary | ICD-10-CM

## 2020-03-04 DIAGNOSIS — R2681 Unsteadiness on feet: Secondary | ICD-10-CM

## 2020-03-04 DIAGNOSIS — R278 Other lack of coordination: Secondary | ICD-10-CM

## 2020-03-04 DIAGNOSIS — R2689 Other abnormalities of gait and mobility: Secondary | ICD-10-CM

## 2020-03-04 DIAGNOSIS — J9621 Acute and chronic respiratory failure with hypoxia: Secondary | ICD-10-CM

## 2020-03-04 NOTE — Therapy (Signed)
[facility] MAIN [facility] SERVICES [address] [place], [place], [address] Phone: [phone]   Fax:  [phone]  Occupational Therapy Treatment  Patient Details  Name: Clifford Nichols MRN: [id number] Date of Birth: 1981/07/17 No data recorded  Encounter Date: 03/04/2020   OT End of Session - 03/04/20 1152    Visit Number 2    Number of Visits 24    Date for OT Re-Evaluation 05/15/20    OT Start Time 1015    OT Stop Time 1100    OT Time Calculation (min) 45 min    Activity Tolerance Patient tolerated treatment well    Behavior During Therapy [facility] for tasks assessed/performed           Past Medical History:  Diagnosis Date  . Abscess of upper back excluding scapular region   . Diabetes mellitus without complication (HCC)   . GERD (gastroesophageal reflux disease)    OCC-NO MEDS  . Hypertension   . Sebaceous cyst     Past Surgical History:  Procedure Laterality Date  . IRRIGATION AND DEBRIDEMENT SEBACEOUS CYST Right 07/11/2018   Procedure: EXCISION  SEBACEOUS CYST POSTERIOR SHOULDER AND MID-BACK;  Surgeon: Ancil Linsey, MD;  Location: [facility] ORS;  Service: General;  Laterality: Right;  . NO PAST SURGERIES      There were no vitals filed for this visit.   Subjective Assessment - 03/04/20 1151    Subjective  Pt. reports doing well today.    Pertinent History Patient is a pleasant 39 year old male who presents for diffuse weakness. Patient ambulates with a walker and bilateral AFOs s/p hospilization. Per documentation his sacral wound has healed and will go to the wound center on 03/16/20.  Clifford Nichols  was hospitalized at [facility] 5/10-5/12 for Acute Respiratory failure Covid 19 he transferred to [facility] on 5/12-6/21 Dx ARDS,Acute on chronic respiratory failure, s/p trach. He was then transferred to inpatient rehab at [facility] 6/21-7/2 Critical illness polyneuropathy, Obstructive sleep Apnea , diabetes, hypertension  . Comorbidities include:  Morbid obesity , Hypertension  He was discharged to home on 01/24/20. 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    Patient Stated Goals Patient would like to return to his prior level of independence.    Currently in Pain? No/denies          OT TREATMENT    Therapeutic Exercise:  Pt. performed 3# dowel ex. For UE strengthening secondary to weakness. Bilateral shoulder flexion, chest press, circular patterns, and elbow flexion/extension were performed. 2 sets 10 reps each with rest breaks. 3# dumbbell ex. for elbow flexion and extension, forearm supination/pronation, wrist flexion/extension, and radial deviation. 1-2 sets 10-20 reps each with cues for proper form. Pt. required increased cues. Pt. worked on simultaneous reaching for targets placed at various angles with a dowel. Pt. Performed reaching with the RUE, and LUE with a 3# cuff weight using the shape tower while crossing midline to place them on the tallest rung.  Pt. Tolerated the exercises well, however required consistent rest breaks. SO2 97% HR 113-129 bpms. Pt. Education was provided about energy conservation techniques, and pursed lip breathing techniques. Reviewed self-dressing status with the patient. Pt. Reports independence with donning pants, and modified independence with socks. Pt. Reports requiring assist donning, and tying shoes secondary to peripheral neuropathy, and AFO braces. Pt. Reports having tried A/E equipment  in inpatient rehab. Elastic shoelaces were deemed not to be appropriate for his specific  situation. Pt. continues to work on improving UE strength, and energy conservation/work simplification strategies to maximize activity tolerance during ADLs, and IADLs.                        OT Education - 03/04/20 1152    Education Details Role of OT, goals, plan of care    Person(s) Educated Patient;Parent(s)    Methods Explanation    Comprehension Verbalized  understanding               OT Long Term Goals - 02/21/20 1828      OT LONG TERM GOAL #1   Title Patient will be independent with home exercise program to improve strength in left upper extremity by 1 mm grade.    Baseline 4/5 overall in LUE    Time 12    Period Weeks    Status New    Target Date 05/15/20      OT LONG TERM GOAL #2   Title Patient will complete tub bench transfers with modified independence    Baseline not using shower at eval, sponge bath only.    Time 12    Period Weeks    Status New    Target Date 05/15/20      OT LONG TERM GOAL #3   Title Patient will complete lower body dressing with shoes and AFOs with min assist and adaptive equipment as needed.    Baseline max at eval for shoes and AFOs.    Time 12    Period Weeks    Status New    Target Date 05/15/20      OT LONG TERM GOAL #4   Title Patient will complete light meal preparation with modified independence.    Baseline unable at eval    Time 6    Period Weeks    Status New    Target Date 04/03/20      OT LONG TERM GOAL #5   Title Patient will complete light homemaking tasks with modified independence.    Baseline unable at eval    Time 12    Period Weeks    Status New    Target Date 05/15/20      Long Term Additional Goals   Additional Long Term Goals Yes      OT LONG TERM GOAL #6   Title Patient will increase FOTO score to equal to or greater than   67%  to demonstrate statistically significant improvement in ADL/IADLs and quality of life.    Baseline FOTO at eval 56    Time 12    Period Weeks    Status New    Target Date 05/15/20                 Plan - 03/04/20 1153    Clinical Impression Statement Pt. Tolerated the exercises well, however required consistent rest breaks. SO2 97% HR 113-129 bpms. Pt. Education was provided about energy conservation techniques, and pursed lip breathing techniques. Reviewed self-dressing status with the patient. Pt. Reports independence  with donning pants, and modified independence with socks. Pt. Reports requiring assist donning, and tying shoes secondary to peripheral neuropathy, and AFO braces. Pt. Reports having tried A/E equipment  in inpatient rehab. Elastic shoelaces were deemed not to be appropriate for his specific situation. Pt. continues to work on improving UE strength, and energy conservation/work simplification strategies to maximize activity tolerance during ADLs, and IADLs.   OT Occupational Profile and  History Comprehensive Assessment- Review of records and extensive additional review of physical, cognitive, psychosocial history related to current functional performance    Occupational performance deficits (Please refer to evaluation for details): ADL's;Work;Leisure;IADL's;Social Participation    Body Structure / Function / Physical Skills ADL;Dexterity;Flexibility;ROM;Strength;Balance;Coordination;IADL;Endurance;Pain;UE functional use;GMC;Mobility    Psychosocial Skills Environmental  Adaptations;Habits;Routines and Behaviors    Rehab Potential Good    Clinical Decision Making Limited treatment options, no task modification necessary    Comorbidities Affecting Occupational Performance: Presence of comorbidities impacting occupational performance    Comorbidities impacting occupational performance description: morbid obesity, OSA, critical illness polyneuropathy, s/p tracheostomy, acute on chronic respiratory failure with hypoxia, ARDS, Covid 19, Hyperglycemia, diabetes, HTN    OT Frequency 2x / week    OT Duration 12 weeks    OT Treatment/Interventions Self-care/ADL training;Cryotherapy;Therapeutic exercise;DME and/or AE instruction;Functional Mobility Training;Balance training;Neuromuscular education;Manual Microbiologist;Therapeutic activities;Patient/family education    Consulted and Agree with Plan of Care Patient;Family member/caregiver    Family Member Consulted mom           Patient will  benefit from skilled therapeutic intervention in order to improve the following deficits and impairments:   Body Structure / Function / Physical Skills: ADL, Dexterity, Flexibility, ROM, Strength, Balance, Coordination, IADL, Endurance, Pain, UE functional use, GMC, Mobility   Psychosocial Skills: Environmental  Adaptations, Habits, Routines and Behaviors   Visit Diagnosis: Muscle weakness (generalized)  Other lack of coordination    Problem List Patient Active Problem List   Diagnosis Date Noted  . Morbid obesity with BMI of 50.0-59.9, adult (HCC) 01/27/2020  . OSA (obstructive sleep apnea) 01/27/2020  . Critical illness polyneuropathy (HCC) 01/13/2020  . Debility 01/13/2020  . Status post tracheostomy (HCC)   . Pressure injury of skin 12/27/2019  . Acute on chronic respiratory failure with hypoxia and hypercapnia (HCC)   . Acute respiratory failure (HCC)   . ARDS (adult respiratory distress syndrome) (HCC) 12/04/2019  . COVID-19 12/03/2019  . Acute respiratory failure due to COVID-19 (HCC) 12/02/2019  . Hyperglycemia 12/02/2019  . Diabetes (HCC) 12/02/2019  . HTN (hypertension) 04/27/2018    Olegario Messier, MS, OTR/L 03/04/2020, 11:57 AM  [place] [facility] MAIN [facility] SERVICES [address] [place], [place], [address] Phone: [phone]   Fax:  [phone]  Name: Clifford Nichols MRN: [id number] Date of Birth: 07-07-1981
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
## 2020-03-09 ENCOUNTER — Ambulatory Visit: Payer: BLUE CROSS/BLUE SHIELD

## 2020-03-11 ENCOUNTER — Encounter: Payer: BLUE CROSS/BLUE SHIELD | Admitting: Occupational Therapy

## 2020-03-11 ENCOUNTER — Ambulatory Visit: Payer: BLUE CROSS/BLUE SHIELD

## 2020-03-12 ENCOUNTER — Ambulatory Visit: Payer: BLUE CROSS/BLUE SHIELD

## 2020-03-16 ENCOUNTER — Ambulatory Visit: Payer: BLUE CROSS/BLUE SHIELD | Admitting: Physician Assistant

## 2020-03-17 ENCOUNTER — Ambulatory Visit: Payer: BLUE CROSS/BLUE SHIELD | Admitting: Physical Therapy

## 2020-03-17 ENCOUNTER — Encounter: Payer: BLUE CROSS/BLUE SHIELD | Admitting: Occupational Therapy

## 2020-03-18 ENCOUNTER — Ambulatory Visit: Payer: BLUE CROSS/BLUE SHIELD

## 2020-03-18 ENCOUNTER — Other Ambulatory Visit: Payer: Self-pay

## 2020-03-18 DIAGNOSIS — R2681 Unsteadiness on feet: Secondary | ICD-10-CM

## 2020-03-18 DIAGNOSIS — R2689 Other abnormalities of gait and mobility: Secondary | ICD-10-CM

## 2020-03-18 DIAGNOSIS — R278 Other lack of coordination: Secondary | ICD-10-CM

## 2020-03-18 DIAGNOSIS — M6281 Muscle weakness (generalized): Secondary | ICD-10-CM | POA: Diagnosis not present
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
## 2020-03-20 ENCOUNTER — Other Ambulatory Visit: Payer: Self-pay

## 2020-03-20 ENCOUNTER — Ambulatory Visit: Payer: BLUE CROSS/BLUE SHIELD

## 2020-03-20 DIAGNOSIS — M6281 Muscle weakness (generalized): Secondary | ICD-10-CM | POA: Diagnosis not present

## 2020-03-20 DIAGNOSIS — G6281 Critical illness polyneuropathy: Secondary | ICD-10-CM

## 2020-03-20 DIAGNOSIS — J9622 Acute and chronic respiratory failure with hypercapnia: Secondary | ICD-10-CM

## 2020-03-20 DIAGNOSIS — R2681 Unsteadiness on feet: Secondary | ICD-10-CM

## 2020-03-20 DIAGNOSIS — R2689 Other abnormalities of gait and mobility: Secondary | ICD-10-CM

## 2020-03-20 DIAGNOSIS — R278 Other lack of coordination: Secondary | ICD-10-CM
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
## 2020-03-23 ENCOUNTER — Other Ambulatory Visit: Payer: Self-pay

## 2020-03-23 ENCOUNTER — Ambulatory Visit: Payer: BLUE CROSS/BLUE SHIELD

## 2020-03-23 DIAGNOSIS — R278 Other lack of coordination: Secondary | ICD-10-CM

## 2020-03-23 DIAGNOSIS — R2689 Other abnormalities of gait and mobility: Secondary | ICD-10-CM

## 2020-03-23 DIAGNOSIS — M6281 Muscle weakness (generalized): Secondary | ICD-10-CM

## 2020-03-23 DIAGNOSIS — G6281 Critical illness polyneuropathy: Secondary | ICD-10-CM

## 2020-03-23 DIAGNOSIS — J9622 Acute and chronic respiratory failure with hypercapnia: Secondary | ICD-10-CM

## 2020-03-23 DIAGNOSIS — R2681 Unsteadiness on feet: Secondary | ICD-10-CM
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
## 2020-03-24 ENCOUNTER — Encounter: Payer: Self-pay | Admitting: Physical Medicine & Rehabilitation

## 2020-03-24 ENCOUNTER — Encounter
Payer: No Typology Code available for payment source | Attending: Registered Nurse | Admitting: Physical Medicine & Rehabilitation

## 2020-03-24 ENCOUNTER — Other Ambulatory Visit: Payer: Self-pay

## 2020-03-24 VITALS — BP 123/85 | HR 107 | Temp 98.8°F | Ht 66.0 in | Wt 347.0 lb

## 2020-03-24 DIAGNOSIS — G6281 Critical illness polyneuropathy: Secondary | ICD-10-CM

## 2020-03-24 DIAGNOSIS — Z6841 Body Mass Index (BMI) 40.0 and over, adult: Secondary | ICD-10-CM | POA: Diagnosis present

## 2020-03-24 DIAGNOSIS — R5381 Other malaise: Secondary | ICD-10-CM | POA: Diagnosis present

## 2020-03-24 DIAGNOSIS — I10 Essential (primary) hypertension: Secondary | ICD-10-CM | POA: Diagnosis present

## 2020-03-24 DIAGNOSIS — U071 COVID-19: Secondary | ICD-10-CM | POA: Insufficient documentation

## 2020-03-24 NOTE — Progress Notes (Signed)
Subjective:    Patient ID: Clifford Nichols, male    DOB: 04-Dec-1980, 39 y.o.   MRN: [id number] 39 y.o. male with history of HTN-no meds times couple of months, untreated OSA, morbid obesity--BMI 52.9 who was admitted on 12/02/2019 with acute respiratory failure and ARDS from COVID-19 PNA. He required intubation and was treated with Tocilizumab, remdesivir, Decadron as well as broad-spectrum antibiotics. He was not felt to be a candidate for ECMO due to markedly elevated BMI with patient with proning as well as ketamine. Hospital course significant for septic shock as well as HCAP treated with multiple antibiotics. He continued to have ongoing fevers and positive blood cultures felt to be due to contaminant and antibiotics eventually discontinued by 06/01. He was found to have intermittent left proptosis and CT of head was negative period wife reported this was a chronic problem and patient to follow-up with ophthalmology past discharge.   He did have acute blood loss anemia with thrombocytopenia requiring 2 units PRBCs, required tracheostomy 05/28 and was extubated to ATC. He was briefly downsized to a cuffless trach but changed back to cuffed #6 XLT due to need for TTS vent at night. He was weaned off BiPAP and respiratory status stable on 5 L oxygen.  Stage III sacral decub was being treated with hydrotherapy. Pain bilateral feet was felt to be due to plantar fasciitis and treated with oxycodone as needed. He is tolerating clears and diet was advanced to regular with thin liquids. He is tolerating PMV trials with improvement in activity tolerance. Therapy ongoing and CIR was recommended due to critical illness polyneuropathy with functional decline. Admit date: 01/13/2020 Discharge date: 01/24/2020  HPI Numb in feet and ankles, still cannot move toes or ankles Mod I ADL Sacral wound healed Furniture walks at home, had 2 falls prior to 7/16, none since, no injury Uses walker outside the home Mother  accompanies patient asking about motorized chair  Worked at [employer], was told that he will have to be 100% to go back to that position  Pain Inventory Average Pain 4 Pain Right Now 4 My pain is intermittent, sharp and aching  In the last 24 hours, has pain interfered with the following? General activity 0 Relation with others 0 Enjoyment of life 0 What TIME of day is your pain at its worst? night Sleep (in general) Fair  Pain is worse with: inactivity Pain improves with: medication Relief from Meds: 7  Family History  Problem Relation Age of Onset  . Hypertension Father    Social History   Socioeconomic History  . Marital status: Married    Spouse name: Not on file  . Number of children: Not on file  . Years of education: Not on file  . Highest education level: Not on file  Occupational History  . Not on file  Tobacco Use  . Smoking status: Former Smoker    Packs/day: 0.50    Types: Cigarettes    Quit date: 12/31/2017    Years since quitting: 2.2  . Smokeless tobacco: Never Used  Vaping Use  . Vaping Use: Never used  Substance and Sexual Activity  . Alcohol use: Not Currently  . Drug use: No  . Sexual activity: Yes  Other Topics Concern  . Not on file  Social History Narrative  . Not on file   Social Determinants of Health   Financial Resource Strain:   . Difficulty of Paying Living Expenses: Not on file  Food Insecurity:   .  Worried About Programme researcher, broadcasting/film/video in the Last Year: Not on file  . Ran Out of Food in the Last Year: Not on file  Transportation Needs:   . Lack of Transportation (Medical): Not on file  . Lack of Transportation (Non-Medical): Not on file  Physical Activity:   . Days of Exercise per Week: Not on file  . Minutes of Exercise per Session: Not on file  Stress:   . Feeling of Stress : Not on file  Social Connections:   . Frequency of Communication with Friends and Family: Not on file  . Frequency of Social  Gatherings with Friends and Family: Not on file  . Attends Religious Services: Not on file  . Active Member of Clubs or Organizations: Not on file  . Attends Banker Meetings: Not on file  . Marital Status: Not on file   Past Surgical History:  Procedure Laterality Date  . IRRIGATION AND DEBRIDEMENT SEBACEOUS CYST Right 07/11/2018   Procedure: EXCISION  SEBACEOUS CYST POSTERIOR SHOULDER AND MID-BACK;  Surgeon: Ancil Linsey, MD;  Location: [facility] ORS;  Service: General;  Laterality: Right;  . NO PAST SURGERIES     Past Surgical History:  Procedure Laterality Date  . IRRIGATION AND DEBRIDEMENT SEBACEOUS CYST Right 07/11/2018   Procedure: EXCISION  SEBACEOUS CYST POSTERIOR SHOULDER AND MID-BACK;  Surgeon: Ancil Linsey, MD;  Location: [facility] ORS;  Service: General;  Laterality: Right;  . NO PAST SURGERIES     Past Medical History:  Diagnosis Date  . Abscess of upper back excluding scapular region   . Diabetes mellitus without complication (HCC)   . GERD (gastroesophageal reflux disease)    OCC-NO MEDS  . Hypertension   . Sebaceous cyst    BP 123/85   Pulse (!) 107   Temp 98.8 F (37.1 C)   Ht 5\' 6"  (1.676 m)   Wt (!) 347 lb (157.4 kg)   SpO2 95%   BMI 56.01 kg/m   Opioid Risk Score:   Fall Risk Score:  `1  Depression screen PHQ 2/9  Depression screen PHQ 2/9 02/10/2020  Decreased Interest 0  Down, Depressed, Hopeless 0  PHQ - 2 Score 0  Altered sleeping 1  Tired, decreased energy 0  Change in appetite 0  Feeling bad or failure about yourself  0  Trouble concentrating 0  Moving slowly or fidgety/restless 0  Suicidal thoughts 0  PHQ-9 Score 1   Review of Systems  Constitutional: Negative.   HENT: Negative.   Eyes: Negative.   Respiratory: Negative.   Cardiovascular: Negative.   Gastrointestinal: Negative.   Endocrine: Negative.   Genitourinary: Negative.   Musculoskeletal:       Left & Right foot pain  Skin: Negative.    Allergic/Immunologic: Negative.   Neurological: Negative.   Hematological: Negative.   Psychiatric/Behavioral: Negative.   All other systems reviewed and are negative.      Objective:   Physical Exam Vitals and nursing note reviewed.  Constitutional:      Appearance: He is obese.  HENT:     Head: Normocephalic and atraumatic.  Eyes:     General: No scleral icterus.       Right eye: No discharge.        Left eye: No discharge.     Extraocular Movements: Extraocular movements intact.     Conjunctiva/sclera: Conjunctivae normal.     Pupils: Pupils are equal, round, and reactive to light.  Musculoskeletal:  General: No tenderness or deformity.     Right lower leg: No edema.     Left lower leg: No edema.  Skin:    General: Skin is warm and dry.     Coloration: Skin is not jaundiced.     Findings: No bruising.  Neurological:     Mental Status: He is alert and oriented to person, place, and time.     Cranial Nerves: No dysarthria or facial asymmetry.     Sensory: Sensory deficit present.     Motor: Weakness and abnormal muscle tone present. No tremor.     Gait: Gait abnormal.     Comments: Motor strength is 5/5 bilateral deltoid bicep tricep grip 5/5 bilateral hip flexors and knee extensors 0 at the ankle dorsiflexors and plantar flexors bilaterally 0 in bilateral toe flexors and extensors. Sensation is absent to pinprick below the ankles bilaterally. Ambulates with a walker as well as bilateral AFOs good foot clearance bilaterally no knee instability.  Psychiatric:        Mood and Affect: Mood normal.        Behavior: Behavior normal.        Thought Content: Thought content normal.        Judgment: Judgment normal.           Assessment & Plan:  #1.  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  He has made excellent progress with his general debility and is now independent with his bathing, his dressing is still limited in terms of donning and  doffing AFOs and shoes.  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  We discussed that he may need to find a sedentary position once he is ready to get back to work.  We will continue outpatient therapy he is also unable to drive at the current time and may need a formal driver's evaluation.  I will see him back in 6 weeks for EMG/NCV bilateral lower extremities to look at prognosis for recovery. We will fill out short-term disability forms today

## 2020-03-24 NOTE — Patient Instructions (Signed)
Your physician has ordered a Nerve Conduction Study (NCV) and/or EMG testing.  This is a test to assess the status of your nerves and muscles. For the NCV portion of the test , sticky tabs will be placed on either your hands or feet.  Your nerves will be stimulated using small electrical charges and the speed of the impulse will be measured as it travels down the nerve. For the EMG portion of the test, a small pin will be placed below the surface of the skin to measure the electrical activity in certain muscles in your arms or legs. Eating/drinking prior to testing is ok.  Please DO NOT discontinue ANY medications prior to testing  Your appointment will be scheduled by a member of our team.  You will need to check in with the main reception desk. Your test could take approximately 30 minutes to 1 hour to complete depending on the extent of the test that has been ordered. TESTING ON LEGS/BACK/HIP/FOOT AREA: Bring/wear a pair of shorts.  **ABSOLUTELY NO LOTIONS, MOISTURIZERS, VASELINE, OILS OR CREAMS OF ANY KIND ON ANY BODY PART THE DAY OF THE TEST**  **DEODORANT IS OK TO WEAR**  Please make every effort to keep your scheduled appointment time, as your physician needs the test results prior to your next appointment.  If you have to cancel or reschedule, please do so as soon as possible, so that another patient may use that testing time slot.  If you cancel your appointment, you may also need to reschedule your referring doctor's appointment until the test and results can be completed.  Please call [phone] and ask for Dr. Newton's assistant if you need to do so.  If you have any questions at any time please let us know.  We look forward to working with you!! 

## 2020-03-25 ENCOUNTER — Telehealth: Payer: Self-pay | Admitting: Physical Medicine & Rehabilitation

## 2020-03-25 NOTE — Telephone Encounter (Signed)
Spoke with patient's wife about paperwork that Dr. Wynn Banker has filled out.  Will leave paperwork up front for patient to pick up, there is a $25 charge for paperwork.  Will pick up on 03/26/20.

## 2020-03-26 ENCOUNTER — Ambulatory Visit: Payer: No Typology Code available for payment source

## 2020-03-31 ENCOUNTER — Ambulatory Visit: Payer: No Typology Code available for payment source

## 2020-04-01 ENCOUNTER — Ambulatory Visit: Payer: No Typology Code available for payment source

## 2020-04-02 ENCOUNTER — Encounter: Payer: BLUE CROSS/BLUE SHIELD | Admitting: Occupational Therapy

## 2020-04-02 ENCOUNTER — Other Ambulatory Visit: Payer: Self-pay

## 2020-04-02 ENCOUNTER — Ambulatory Visit: Payer: No Typology Code available for payment source | Attending: Physical Medicine & Rehabilitation

## 2020-04-02 DIAGNOSIS — R2681 Unsteadiness on feet: Secondary | ICD-10-CM | POA: Diagnosis present

## 2020-04-02 DIAGNOSIS — R278 Other lack of coordination: Secondary | ICD-10-CM | POA: Insufficient documentation

## 2020-04-02 DIAGNOSIS — R2689 Other abnormalities of gait and mobility: Secondary | ICD-10-CM | POA: Insufficient documentation

## 2020-04-02 DIAGNOSIS — M6281 Muscle weakness (generalized): Secondary | ICD-10-CM | POA: Diagnosis not present
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
## 2020-04-05 DIAGNOSIS — Z789 Other specified health status: Secondary | ICD-10-CM | POA: Insufficient documentation

## 2020-04-05 DIAGNOSIS — Z79891 Long term (current) use of opiate analgesic: Secondary | ICD-10-CM | POA: Insufficient documentation

## 2020-04-05 DIAGNOSIS — M792 Neuralgia and neuritis, unspecified: Secondary | ICD-10-CM | POA: Insufficient documentation

## 2020-04-05 DIAGNOSIS — F1190 Opioid use, unspecified, uncomplicated: Secondary | ICD-10-CM | POA: Insufficient documentation

## 2020-04-05 DIAGNOSIS — F1120 Opioid dependence, uncomplicated: Secondary | ICD-10-CM | POA: Insufficient documentation

## 2020-04-05 DIAGNOSIS — M899 Disorder of bone, unspecified: Secondary | ICD-10-CM | POA: Insufficient documentation

## 2020-04-05 DIAGNOSIS — G894 Chronic pain syndrome: Secondary | ICD-10-CM | POA: Insufficient documentation

## 2020-04-05 DIAGNOSIS — Z79899 Other long term (current) drug therapy: Secondary | ICD-10-CM | POA: Insufficient documentation

## 2020-04-05 NOTE — Progress Notes (Signed)
Patient: Clifford Nichols  Service Category: E/M  Provider: Gaspar Cola, MD  DOB: 1980-11-29  DOS: 04/06/2020  Referring Provider: [name], MD  MRN: [id number]  Setting: Ambulatory outpatient  PCP: [facility] [place]  Type: New Patient  Specialty: Interventional Pain Management    Location: Office  Delivery: Face-to-face     Primary Reason(s) for Visit: Encounter for initial evaluation of one or more chronic problems (new to examiner) potentially causing chronic pain, and posing a threat to normal musculoskeletal function. (Level of risk: High) CC: Foot Pain (bilateral)  HPI  Clifford Nichols is a 39 y.o. year old, male patient, who comes for the first time to our practice referred by [name], MD for our initial evaluation of his chronic pain. He has HTN (hypertension); Acute respiratory failure due to COVID-19 [facility]); Hyperglycemia; Diabetes ([place]); COVID-19; ARDS (adult respiratory distress syndrome) ([place]); Acute respiratory failure ([place]); Acute on chronic respiratory failure with hypoxia and hypercapnia ([place]); Pressure injury of skin; Status post tracheostomy ([place]); Critical illness polyneuropathy ([place]); Debility; Morbid obesity with BMI of 50.0-59.9, adult ([place]); OSA (obstructive sleep apnea); Chronic pain syndrome; Pharmacologic therapy; Disorder of skeletal system; Problems influencing health status; Long term prescription opiate use; Uncomplicated opioid dependence ([place]); Opioid use (90 MME); 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 Today he comes in for evaluation of his Foot Pain (bilateral)  Pain Assessment: Location: Left, Right Foot Duration: Chronic pain Quality: Aching, Throbbing, Shooting Severity: 5 /10 (subjective, self-reported pain score)  Timing: Constant BP: 138/83  HR: (!) 101  Onset and Duration: Sudden and Present longer than 3  months Cause of pain: Work related accident or event Severity: Getting better Timing: Night Aggravating Factors: Prolonged standing and Working Alleviating Factors: Medications Associated Problems: Depression, Pain that wakes patient up and Pain that does not allow patient to sleep Quality of Pain: Aching, Burning, Shooting and Throbbing Previous Examinations or Tests: The patient denies none Previous Treatments: The patient denies none  According to the patient his primary area of pain is that of his feet, bilaterally with the right being equal to the left.  He describes that this started after having been hospitalized with a respiratory failure secondary to COVID-19.  Apparently he was sedated and kept on a ventilator for approximately 21 days.  After that he woke up with decreased sensation in his feet with a neuropathy.  He indicates having the numbness in the bottom of his feet and lateral aspect of it.  He also describes having pain in that area as well as some pain in the buttocks area, from prolonged sitting.  He indicates not having had a nerve conduction test but being scheduled to have one with Dr. Kittie Plater, who has been helping him recover from this illness.  He denies any surgeries, x-rays, nerve blocks, and he is currently undergoing physical therapy helping with his balance and the ability to walk and to rely less on his walker.  Currently he describes being on Neurontin 600 mg p.o. 3 times daily and 800 mg at bedtime.  He denies any side effects or problems with the Neurontin.  In addition he is taking Cymbalta 60 mg p.o. daily.  He is taking Metformin for what appears to be a non-insulin-dependent diabetes mellitus.  He refers that he was found to have an elevated hemoglobin A1c.  He also describes having been on insulin at the beginning, but now  just being on the Metformin.  Today I took the time to provide the patient with information regarding my pain practice. The patient was  informed that my practice is divided into two sections: an interventional pain management section, as well as a completely separate and distinct medication management section. I explained that I have procedure days for my interventional therapies, and evaluation days for follow-ups and medication management. Because of the amount of documentation required during both, they are kept separated. This means that there is the possibility that he may be scheduled for a procedure on one day, and medication management the next. I have also informed him that because of staffing and facility limitations, I no longer take patients for medication management only. To illustrate the reasons for this, I gave the patient the example of surgeons, and how inappropriate it would be to refer a patient to his/her care, just to write for the post-surgical antibiotics on a surgery done by a different surgeon.   Because interventional pain management is my board-certified specialty, the patient was informed that joining my practice means that they are open to any and all interventional therapies. I made it clear that this does not mean that they will be forced to have any procedures done. What this means is that I believe interventional therapies to be essential part of the diagnosis and proper management of chronic pain conditions. Therefore, patients not interested in these interventional alternatives will be better served under the care of a different practitioner.  The patient was also made aware of my Comprehensive Pain Management Safety Guidelines where by joining my practice, they limit all of their nerve blocks and joint injections to those done by our practice, for as long as we are retained to manage their care.   Historic Controlled Substance Pharmacotherapy Review  PMP and historical list of controlled substances: Oxycodone IR 10 mg; oxycodone/APAP 5/325; oxycodone IR 5 mg.  (Last prescription: Oxycodone IR 10 mg, 1  every 4 hours (# 30) (written and filled on 01/23/2020) (90 MME) Current opioid analgesics:  None MME/day: 0 mg/day  Historical Monitoring: The patient  reports no history of drug use. List of all UDS Test(s): No results found. List of other Serum/Urine Drug Screening Test(s):  No results found. Historical Background Evaluation: [place] PMP: PDMP reviewed during this encounter. Online review of the past 44-monthperiod conducted.             PMP NARX Score Report:  Narcotic: 180 Sedative: 080 Stimulant: 000 [place] Department of public safety, offender search: (Editor, commissioningInformation) Non-contributory Risk Assessment Profile: Aberrant behavior: None observed or detected today Risk factors for fatal opioid overdose: None identified today PMP NARX Overdose Risk Score: 270 Fatal overdose hazard ratio (HR): Calculation deferred Non-fatal overdose hazard ratio (HR): Calculation deferred Risk of opioid abuse or dependence: 0.7-3.0% with doses ? 36 MME/day and 6.1-26% with doses ? 120 MME/day. Substance use disorder (SUD) risk level: See below Personal History of Substance Abuse (SUD-Substance use disorder):  Alcohol: Negative  Illegal Drugs: Negative  Rx Drugs: Negative  ORT Risk Level calculation: Low Risk  Opioid Risk Tool - 04/06/20 1325      Family History of Substance Abuse   Alcohol Negative    Illegal Drugs Negative    Rx Drugs Negative      Personal History of Substance Abuse   Alcohol Negative    Illegal Drugs Negative    Rx Drugs Negative      Age   Age  between 16-45 years  Yes      History of Preadolescent Sexual Abuse   History of Preadolescent Sexual Abuse Negative or Male      Psychological Disease   Psychological Disease Negative    Depression Negative      Total Score   Opioid Risk Tool Scoring 1    Opioid Risk Interpretation Low Risk          ORT Scoring interpretation table:  Score <3 = Low Risk for SUD  Score between 4-7 = Moderate Risk for SUD  Score >8 =  High Risk for Opioid Abuse   PHQ-2 Depression Scale:  Total score: 0  PHQ-2 Scoring interpretation table: (Score and probability of major depressive disorder)  Score 0 = No depression  Score 1 = 15.4% Probability  Score 2 = 21.1% Probability  Score 3 = 38.4% Probability  Score 4 = 45.5% Probability  Score 5 = 56.4% Probability  Score 6 = 78.6% Probability   PHQ-9 Depression Scale:  Total score: 0  PHQ-9 Scoring interpretation table:  Score 0-4 = No depression  Score 5-9 = Mild depression  Score 10-14 = Moderate depression  Score 15-19 = Moderately severe depression  Score 20-27 = Severe depression (2.4 times higher risk of SUD and 2.89 times higher risk of overuse)   Pharmacologic Plan: As per protocol, I have not taken over any controlled substance management, pending the results of ordered tests and/or consults.            Initial impression: Pending review of available data and ordered tests.  Meds   Current Outpatient Medications:  .  acetaminophen (TYLENOL) 325 MG tablet, Take 1-2 tablets (325-650 mg total) by mouth every 4 (four) hours as needed for mild pain., Disp: , Rfl:  .  ascorbic acid (VITAMIN C) 500 MG tablet, Take 1 tablet (500 mg total) by mouth daily., Disp: 30 tablet, Rfl: 0 .  DULoxetine (CYMBALTA) 60 MG capsule, Take 1 capsule (60 mg total) by mouth daily., Disp: 60 capsule, Rfl: 3 .  gabapentin (NEURONTIN) 300 MG capsule, Take 2 capsules (600 mg total) by mouth 3 (three) times daily., Disp: 90 capsule, Rfl: 1 .  gabapentin (NEURONTIN) 800 MG tablet, Take 800 mg by mouth at bedtime. , Disp: , Rfl:  .  metFORMIN (GLUCOPHAGE) 500 MG tablet, Take 1 tablet (500 mg total) by mouth daily with breakfast., Disp: 30 tablet, Rfl: 1 .  Multiple Vitamin (MULTIVITAMIN WITH MINERALS) TABS tablet, Take 1 tablet by mouth daily., Disp: , Rfl:  .  zinc sulfate 220 (50 Zn) MG capsule, Take 1 capsule (220 mg total) by mouth daily., Disp: 30 capsule, Rfl: 1  Imaging Review    Complexity Note: No results found under the [employer] electronic medical record.                        ROS  Cardiovascular: No reported cardiovascular signs or symptoms such as High blood pressure, coronary artery disease, abnormal heart rate or rhythm, heart attack, blood thinner therapy or heart weakness and/or failure Pulmonary or Respiratory: No reported pulmonary signs or symptoms such as wheezing and difficulty taking a deep full breath (Asthma), difficulty blowing air out (Emphysema), coughing up mucus (Bronchitis), persistent dry cough, or temporary stoppage of breathing during sleep Neurological: No reported neurological signs or symptoms such as seizures, abnormal skin sensations, urinary and/or fecal incontinence, being born with an abnormal open spine and/or a tethered spinal cord Psychological-Psychiatric:  Depressed Gastrointestinal: No reported gastrointestinal signs or symptoms such as vomiting or evacuating blood, reflux, heartburn, alternating episodes of diarrhea and constipation, inflamed or scarred liver, or pancreas or irrregular and/or infrequent bowel movements Genitourinary: No reported renal or genitourinary signs or symptoms such as difficulty voiding or producing urine, peeing blood, non-functioning kidney, kidney stones, difficulty emptying the bladder, difficulty controlling the flow of urine, or chronic kidney disease Hematological: No reported hematological signs or symptoms such as prolonged bleeding, low or poor functioning platelets, bruising or bleeding easily, hereditary bleeding problems, low energy levels due to low hemoglobin or being anemic Endocrine: No reported endocrine signs or symptoms such as high or low blood sugar, rapid heart rate due to high thyroid levels, obesity or weight gain due to slow thyroid or thyroid disease Rheumatologic: Generalized muscle aches (Fibromyalgia) Musculoskeletal: Negative for myasthenia gravis, muscular dystrophy,  multiple sclerosis or malignant hyperthermia Work History: Disabled  Allergies  Clifford Nichols has No Known Allergies.  Laboratory Chemistry Profile   Renal Lab Results  Component Value Date   BUN 9 01/20/2020   CREATININE 0.87 01/20/2020   GFRAA >60 01/20/2020   GFRNONAA >60 01/20/2020   PROTEINUR 30 (A) 12/09/2019     Electrolytes Lab Results  Component Value Date   NA 137 01/20/2020   K 4.3 01/20/2020   CL 102 01/20/2020   CALCIUM 9.0 01/20/2020   MG 1.9 01/14/2020   PHOS 5.2 (H) 12/29/2019     Hepatic Lab Results  Component Value Date   AST 24 01/14/2020   ALT 30 01/14/2020   ALBUMIN 2.8 (L) 01/14/2020   ALKPHOS 54 01/14/2020   LIPASE 16 08/15/2009     ID Lab Results  Component Value Date   HIV Non Reactive 12/03/2019   SARSCOV2NAA POSITIVE (A) 12/02/2019   MRSAPCR NEGATIVE 12/04/2019     Bone No results found.   Endocrine Lab Results  Component Value Date   GLUCOSE 106 (H) 01/20/2020   GLUCOSEU NEGATIVE 12/09/2019   HGBA1C 6.6 (H) 12/03/2019   TSH 3.425 12/08/2019     Neuropathy Lab Results  Component Value Date   HGBA1C 6.6 (H) 12/03/2019   HIV Non Reactive 12/03/2019     CNS No results found.   Inflammation (CRP: Acute  ESR: Chronic) Lab Results  Component Value Date   CRP 1.0 (H) 12/09/2019   LATICACIDVEN 1.6 12/09/2019     Rheumatology No results found.   Coagulation Lab Results  Component Value Date   INR 1.0 12/04/2019   LABPROT 13.2 12/04/2019   PLT 182 01/20/2020   DDIMER 1.50 (H) 01/13/2020     Cardiovascular Lab Results  Component Value Date   BNP 54.9 01/14/2020   CKTOTAL 173 08/15/2009   HGB 10.4 (L) 01/20/2020   HCT 35.2 (L) 01/20/2020     Screening Lab Results  Component Value Date   SARSCOV2NAA POSITIVE (A) 12/02/2019   MRSAPCR NEGATIVE 12/04/2019   HIV Non Reactive 12/03/2019     Cancer No results found.   Allergens No results found.     Note: Lab results reviewed.  [place]  Drug: Clifford Nichols   reports no history of drug use. Alcohol:  reports previous alcohol use. Tobacco:  reports that he quit smoking about 2 years ago. His smoking use included cigarettes. He smoked 0.50 packs per day. He has never used smokeless tobacco. Medical:  has a past medical history of Abscess of upper back excluding scapular region, Diabetes mellitus without complication ([place]), GERD (gastroesophageal reflux disease),  Hypertension, and Sebaceous cyst. Family: family history includes Hypertension in his father.  Past Surgical History:  Procedure Laterality Date  . IRRIGATION AND DEBRIDEMENT SEBACEOUS CYST Right 07/11/2018   Procedure: EXCISION  SEBACEOUS CYST POSTERIOR SHOULDER AND MID-BACK;  Surgeon: [name], MD;  Location: [facility] ORS;  Service: General;  Laterality: Right;  . NO PAST SURGERIES     Active Ambulatory Problems    Diagnosis Date Noted  . HTN (hypertension) 04/27/2018  . Acute respiratory failure due to COVID-19 ([place]) 12/02/2019  . Hyperglycemia 12/02/2019  . Diabetes ([place]) 12/02/2019  . COVID-19 12/03/2019  . ARDS (adult respiratory distress syndrome) ([place]) 12/04/2019  . Acute respiratory failure ([place])   . Acute on chronic respiratory failure with hypoxia and hypercapnia ([facility])   . Pressure injury of skin 12/27/2019  . Status post tracheostomy ([place])   . Critical illness polyneuropathy ([place]) 01/13/2020  . Debility 01/13/2020  . Morbid obesity with BMI of 50.0-59.9, adult ([place]) 01/27/2020  . OSA (obstructive sleep apnea) 01/27/2020  . Chronic pain syndrome 04/05/2020  . Pharmacologic therapy 04/05/2020  . Disorder of skeletal system 04/05/2020  . Problems influencing health status 04/05/2020  . Long term prescription opiate use 04/05/2020  . Uncomplicated opioid dependence ([place]) 04/05/2020  . Opioid use (90 MME) 04/05/2020  . 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 Neurogenic pain 04/05/2020  . Chronic neuropathic pain 04/05/2020  . Foot pain (Bilateral)  04/07/2020   Resolved Ambulatory Problems    Diagnosis Date Noted  . Sepsis ([place]) 04/27/2018  . Cellulitis and abscess of upper extremity 04/27/2018  . Abscess of upper back excluding scapular region   . Sebaceous cyst    Past Medical History:  Diagnosis Date  . Diabetes mellitus without complication ([place])   . GERD (gastroesophageal reflux disease)   . Hypertension    Constitutional Exam  General appearance: Well nourished, well developed, and well hydrated. In no apparent acute distress Vitals:   04/06/20 1321  BP: 138/83  Pulse: (!) 101  Resp: 16  Temp: 97.7 F (36.5 C)  TempSrc: Temporal  SpO2: 100%  Weight: (!) 337 lb (152.9 kg)  Height: 5' 7"  (1.702 m)   BMI Assessment: Estimated body mass index is 52.78 kg/m as calculated from the following:   Height as of this encounter: 5' 7"  (1.702 m).   Weight as of this encounter: 337 lb (152.9 kg).  BMI interpretation table: BMI level Category Range association with higher incidence of chronic pain  <18 kg/m2 Underweight   18.5-24.9 kg/m2 Ideal body weight   25-29.9 kg/m2 Overweight Increased incidence by 20%  30-34.9 kg/m2 Obese (Class I) Increased incidence by 68%  35-39.9 kg/m2 Severe obesity (Class II) Increased incidence by 136%  >40 kg/m2 Extreme obesity (Class III) Increased incidence by 254%   Patient's current BMI Ideal Body weight  Body mass index is 52.78 kg/m. Ideal body weight: 66.1 kg (145 lb 11.6 oz) Adjusted ideal body weight: 100.8 kg (222 lb 3.7 oz)   BMI Readings from Last 4 Encounters:  04/06/20 52.78 kg/m  03/24/20 56.01 kg/m  02/10/20 55.20 kg/m  01/24/20 52.94 kg/m   Wt Readings from Last 4 Encounters:  04/06/20 (!) 337 lb (152.9 kg)  03/24/20 (!) 347 lb (157.4 kg)  02/10/20 (!) 342 lb (155.1 kg)  01/24/20 (!) 338 lb (153.3 kg)    Psych/Mental status: Alert, oriented x 3 (person, place, & time)       Eyes: PERLA Respiratory: No evidence of acute respiratory distress  He comes into  the clinic today in a wheelchair and he is morbidly obese with a BMI of 52.78 kg/m.  Assessment  Primary Diagnosis & Pertinent Problem List: The primary encounter diagnosis was Chronic pain syndrome. 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  Visit Diagnosis (New problems to examiner): 1. Chronic pain syndrome   2. Foot pain (Bilateral)   3. 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 Critical illness polyneuropathy ([place])   5. Neurogenic pain   6. Chronic neuropathic pain   7. Pharmacologic therapy   8. Long term prescription opiate use   9. Opioid use (90 MME)   10. Disorder of skeletal system   11. Problems influencing health status   12. Morbid obesity with BMI of 50.0-59.9, adult ([place])    Plan of Care (Initial workup plan)  Note: Clifford Nichols was reminded that as per protocol, today's visit has been an evaluation only. We have not taken over the patient's controlled substance management.  Problem-specific plan: No problem-specific Assessment & Plan notes found for this encounter.   Lab Orders     Compliance Drug Analysis, Ur     Vitamin B12     Sedimentation rate     25-Hydroxy vitamin D Lcms D2+D3     C-reactive protein Imaging Orders  No imaging studies ordered today   Referral Orders  No referral(s) requested today   Procedure Orders    No procedure(s) ordered today   Pharmacotherapy (current): Medications ordered:  No orders of the defined types were placed in this encounter.  Medications administered during this visit: Quang L. Collet had no medications administered during this visit.   Pharmacological  management options:  Opioid Analgesics: The patient was informed that there is no guarantee that he would be a candidate for opioid analgesics. The decision will be made following CDC guidelines. This decision will be based on the results of diagnostic studies, as well as Clifford Nichols's risk profile.   Membrane stabilizer: To be determined at a later time  Muscle relaxant: To be determined at a later time  NSAID: To be determined at a later time  Other analgesic(s): To be determined at a later time   Interventional management options: Clifford Nichols was informed that there is no guarantee that he would be a candidate for interventional therapies. The decision will be based on the results of diagnostic studies, as well as Clifford Nichols's risk profile.  Procedure(s) under consideration:  Possible IV lidocaine infusion  Possible bilateral lumbar sympathetic block    Provider-requested follow-up: Return if symptoms worsen or fail to improve.  Future Appointments  Date Time Provider [place]  04/09/2020  4:00 PM Janna Arch, PT [facility] None  04/14/2020  4:00 PM Janna Arch, PT [facility] None  04/16/2020  4:00 PM Janna Arch, PT [facility] None  04/23/2020 10:30 AM Chesley Mires, MD [facility] None  05/07/2020  2:45 PM Kirsteins, Luanna Salk, MD [facility] CPR    Note by: Gaspar Cola, MD Date: 04/06/2020; Time: 7:31 PM

## 2020-04-06 ENCOUNTER — Ambulatory Visit: Payer: No Typology Code available for payment source | Attending: Pain Medicine | Admitting: Pain Medicine

## 2020-04-06 ENCOUNTER — Other Ambulatory Visit: Payer: Self-pay

## 2020-04-06 ENCOUNTER — Encounter: Payer: Self-pay | Admitting: Pain Medicine

## 2020-04-06 VITALS — BP 138/83 | HR 101 | Temp 97.7°F | Resp 16 | Ht 67.0 in | Wt 337.0 lb

## 2020-04-06 DIAGNOSIS — M899 Disorder of bone, unspecified: Secondary | ICD-10-CM | POA: Insufficient documentation

## 2020-04-06 DIAGNOSIS — M792 Neuralgia and neuritis, unspecified: Secondary | ICD-10-CM | POA: Diagnosis present

## 2020-04-06 DIAGNOSIS — Z79899 Other long term (current) drug therapy: Secondary | ICD-10-CM | POA: Insufficient documentation

## 2020-04-06 DIAGNOSIS — G894 Chronic pain syndrome: Secondary | ICD-10-CM | POA: Insufficient documentation

## 2020-04-06 DIAGNOSIS — G8929 Other chronic pain: Secondary | ICD-10-CM | POA: Diagnosis present

## 2020-04-06 DIAGNOSIS — Z6841 Body Mass Index (BMI) 40.0 and over, adult: Secondary | ICD-10-CM | POA: Diagnosis present

## 2020-04-06 DIAGNOSIS — G6281 Critical illness polyneuropathy: Secondary | ICD-10-CM | POA: Insufficient documentation

## 2020-04-06 DIAGNOSIS — M79672 Pain in left foot: Secondary | ICD-10-CM | POA: Insufficient documentation

## 2020-04-06 DIAGNOSIS — Z79891 Long term (current) use of opiate analgesic: Secondary | ICD-10-CM | POA: Insufficient documentation

## 2020-04-06 DIAGNOSIS — G629 Polyneuropathy, unspecified: Secondary | ICD-10-CM | POA: Diagnosis present

## 2020-04-06 DIAGNOSIS — M79671 Pain in right foot: Secondary | ICD-10-CM | POA: Diagnosis present

## 2020-04-06 DIAGNOSIS — Z789 Other specified health status: Secondary | ICD-10-CM | POA: Diagnosis present

## 2020-04-06 DIAGNOSIS — F1190 Opioid use, unspecified, uncomplicated: Secondary | ICD-10-CM | POA: Insufficient documentation

## 2020-04-06 NOTE — Patient Instructions (Signed)
Take Vit B12 & Vit D3 over the counter ______________________________________________________________________________________________  Weight Management Required  URGENT: Your weight has been found to be adversely affecting your health.  Dear Clifford Nichols:  Your current Estimated body mass index is 52.78 kg/m as calculated from the following:   Height as of this encounter: _0  (1.702 m).   Weight as of this encounter: 337 lb (152.9 kg).  Please use the table below to identify your weight category and associated incidence of chronic pain, secondary to your weight.  Body Mass Index (BMI) Classification BMI level (kg/m2) Category Associated incidence of chronic pain  <18  Underweight   18.5-24.9 Ideal body weight   25-29.9 Overweight  20%  30-34.9 Obese (Class I)  68%  35-39.9 Severe obesity (Class II)  136%  >40 Extreme obesity (Class III)  254%   In addition: You will be considered "Morbidly Obese", if your BMI is above 30 and you have one or more of the following conditions which are known to be caused and/or directly associated with obesity: 1.    Type 2 Diabetes (Which in turn can lead to cardiovascular diseases (CVD), stroke, peripheral vascular diseases (PVD), retinopathy, nephropathy, and neuropathy) 2.    Cardiovascular Disease (High Blood Pressure; Congestive Heart Failure; High Cholesterol; Coronary Artery Disease; Angina; or History of Heart Attacks) 3.    Breathing problems (Asthma; obesity-hypoventilation syndrome; obstructive sleep apnea; chronic inflammatory airway disease; reactive airway disease; or shortness of breath) 4.    Chronic kidney disease 5.    Liver disease (nonalcoholic fatty liver disease) 6.    High blood pressure 7.    Acid reflux (gastroesophageal reflux disease; heartburn) 8.    Osteoarthritis (OA) (with any of the following: hip pain; knee pain; and/or low back pain) 9.    Low back pain (Lumbar Facet Syndrome; and/or Degenerative Disc Disease) 10.   Hip pain (Osteoarthritis of hip) (For every 1 lbs of added body weight, there is a 2 lbs increase in pressure inside of each hip articulation. 1:2 mechanical relationship) 11.  Knee pain (Osteoarthritis of knee) (For every 1 lbs of added body weight, there is a 4 lbs increase in pressure inside of each knee articulation. 1:4 mechanical relationship) (patients with a BMI>30 kg/m2 were 6.8 times more likely to develop knee OA than normal-weight individuals) 12.  Cancer: Epidemiological studies have shown that obesity is a risk factor for: post-menopausal breast cancer; cancers of the endometrium, colon and kidney cancer; malignant adenomas of the oesophagus. Obese subjects have an approximately 1.5-3.5-fold increased risk of developing these cancers compared with normal-weight subjects, and it has been estimated that between 15 and 45% of these cancers can be attributed to overweight. More recent studies suggest that obesity may also increase the risk of other types of cancer, including pancreatic, hepatic and gallbladder cancer. (Ref: Obesity and cancer. Pischon T, Nthlings U, [employer] H. Proc Nutr Soc. 2008 May;67(2):128-45. doi: 09.3818/E9937169678938101.) The International Agency for Research on Cancer (IARC) has identified 13 cancers associated with overweight and obesity: meningioma, multiple myeloma, adenocarcinoma of the esophagus, and cancers of the thyroid, postmenopausal breast cancer, gallbladder, stomach, liver, pancreas, kidney, ovaries, uterus, colon and rectal (colorectal) cancers. 57 percent of all cancers diagnosed in women and 24 percent of those diagnosed in men are associated with overweight and obesity.  Recommendation: At this point it is urgent that you take a step back and concentrate in loosing weight. Dedicate 100% of your efforts on this task. Nothing else will improve your health  more than bringing your weight down and your BMI to less than 30. If you are here, you probably have chronic  pain. Because most chronic pain patients have difficulty exercising secondary to their pain, you must rely on proper nutrition and diet in order to lose the weight. If your BMI is above 40, you should seriously consider bariatric surgery. A realistic goal is to lose 10% of your body weight over a period of 12 months.  Be honest to yourself, if over time you have unsuccessfully tried to lose weight, then it is time for you to seek professional help and to enter a medically supervised weight management program, and/or undergo bariatric surgery. Stop procrastinating.   Pain management considerations:  1.    Pharmacological Problems: Be advised that the use of opioid analgesics (oxycodone; hydrocodone; morphine; methadone; codeine; and all of their derivatives) have been associated with decreased metabolism and weight gain.  For this reason, should we see that you are unable to lose weight while taking these medications, it may become necessary for [place] to taper down and indefinitely discontinue them.  2.    Technical Problems: The incidence of successful interventional therapies decreases as the patient's BMI increases. It is much more difficult to accomplish a safe and effective interventional therapy on a patient with a BMI above 35. 3.    Radiation Exposure Problems: The x-rays machine, used to accomplish injection therapies, will automatically increase their x-ray output in order to capture an appropriate bone image. This means that radiation exposure increases exponentially with the patient's BMI. (The higher the BMI, the higher the radiation exposure.) Although the level of radiation used at a given time is still safe to the patient, it is not for the physician and/or assisting staff. Unfortunately, radiation exposure is accumulative. Because physicians and the staff have to do procedures and be exposed on a daily basis, this can result in health problems such as cancer and radiation burns. Radiation exposure  to the staff is monitored by the radiation batches that they wear. The exposure levels are reported back to the staff on a quarterly basis. Depending on levels of exposure, physicians and staff may be obligated by law to decrease this exposure. This means that they have the right and obligation to refuse providing therapies where they may be overexposed to radiation. For this reason, physicians may decline to offer therapies such as radiofrequency ablation or implants to patients with a BMI above 40. 4.    Current Trends: Be advised that the current trend is to no longer offer certain therapies to patients with a BMI equal to, or above 35, due to increase perioperative risks, increased technical procedural difficulties, and excessive radiation exposure to healthcare personnel.  ______________________________________________________________________________________________

## 2020-04-07 ENCOUNTER — Ambulatory Visit: Payer: No Typology Code available for payment source

## 2020-04-07 ENCOUNTER — Encounter: Payer: BLUE CROSS/BLUE SHIELD | Admitting: Occupational Therapy

## 2020-04-07 DIAGNOSIS — M6281 Muscle weakness (generalized): Secondary | ICD-10-CM | POA: Diagnosis not present

## 2020-04-07 DIAGNOSIS — M79672 Pain in left foot: Secondary | ICD-10-CM | POA: Insufficient documentation

## 2020-04-07 DIAGNOSIS — R2681 Unsteadiness on feet: Secondary | ICD-10-CM

## 2020-04-07 DIAGNOSIS — R2689 Other abnormalities of gait and mobility: Secondary | ICD-10-CM

## 2020-04-07 LAB — COMPLIANCE DRUG ANALYSIS, UR
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
## 2020-04-09 ENCOUNTER — Ambulatory Visit: Payer: No Typology Code available for payment source

## 2020-04-09 ENCOUNTER — Other Ambulatory Visit: Payer: Self-pay

## 2020-04-09 DIAGNOSIS — M6281 Muscle weakness (generalized): Secondary | ICD-10-CM

## 2020-04-09 DIAGNOSIS — R2681 Unsteadiness on feet: Secondary | ICD-10-CM

## 2020-04-09 DIAGNOSIS — R2689 Other abnormalities of gait and mobility: Secondary | ICD-10-CM
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
## 2020-04-14 ENCOUNTER — Ambulatory Visit: Payer: No Typology Code available for payment source

## 2020-04-16 ENCOUNTER — Ambulatory Visit: Payer: No Typology Code available for payment source

## 2020-04-16 DIAGNOSIS — R278 Other lack of coordination: Secondary | ICD-10-CM

## 2020-04-16 DIAGNOSIS — M6281 Muscle weakness (generalized): Secondary | ICD-10-CM | POA: Diagnosis not present

## 2020-04-16 DIAGNOSIS — R2689 Other abnormalities of gait and mobility: Secondary | ICD-10-CM

## 2020-04-16 DIAGNOSIS — R2681 Unsteadiness on feet: Secondary | ICD-10-CM
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
## 2020-04-21 ENCOUNTER — Other Ambulatory Visit: Payer: Self-pay

## 2020-04-21 ENCOUNTER — Ambulatory Visit: Payer: No Typology Code available for payment source

## 2020-04-21 DIAGNOSIS — R2689 Other abnormalities of gait and mobility: Secondary | ICD-10-CM

## 2020-04-21 DIAGNOSIS — R2681 Unsteadiness on feet: Secondary | ICD-10-CM

## 2020-04-21 DIAGNOSIS — M6281 Muscle weakness (generalized): Secondary | ICD-10-CM
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
## 2020-04-23 ENCOUNTER — Ambulatory Visit (INDEPENDENT_AMBULATORY_CARE_PROVIDER_SITE_OTHER): Payer: No Typology Code available for payment source | Admitting: Pulmonary Disease

## 2020-04-23 ENCOUNTER — Encounter: Payer: Self-pay | Admitting: Pulmonary Disease

## 2020-04-23 ENCOUNTER — Other Ambulatory Visit: Payer: Self-pay

## 2020-04-23 ENCOUNTER — Ambulatory Visit
Admission: RE | Admit: 2020-04-23 | Discharge: 2020-04-23 | Disposition: A | Discharge status: Home / Self Care | Payer: No Typology Code available for payment source | Attending: Pulmonary Disease | Admitting: Pulmonary Disease

## 2020-04-23 ENCOUNTER — Ambulatory Visit: Payer: No Typology Code available for payment source

## 2020-04-23 ENCOUNTER — Ambulatory Visit
Admission: RE | Admit: 2020-04-23 | Discharge: 2020-04-23 | Disposition: A | Discharge status: Home / Self Care | Payer: No Typology Code available for payment source | Source: Ambulatory Visit | Attending: Pulmonary Disease | Admitting: Pulmonary Disease

## 2020-04-23 VITALS — BP 122/80 | HR 103 | Temp 96.9°F | Ht 67.0 in | Wt 362.0 lb

## 2020-04-23 DIAGNOSIS — R0600 Dyspnea, unspecified: Secondary | ICD-10-CM | POA: Insufficient documentation

## 2020-04-23 DIAGNOSIS — Z8616 Personal history of COVID-19: Secondary | ICD-10-CM | POA: Diagnosis present

## 2020-04-23 DIAGNOSIS — Z8709 Personal history of other diseases of the respiratory system: Secondary | ICD-10-CM

## 2020-04-23 DIAGNOSIS — R0609 Other forms of dyspnea: Secondary | ICD-10-CM

## 2020-04-23 DIAGNOSIS — R2689 Other abnormalities of gait and mobility: Secondary | ICD-10-CM

## 2020-04-23 DIAGNOSIS — J9611 Chronic respiratory failure with hypoxia: Secondary | ICD-10-CM

## 2020-04-23 DIAGNOSIS — R0683 Snoring: Secondary | ICD-10-CM | POA: Diagnosis not present

## 2020-04-23 DIAGNOSIS — M6281 Muscle weakness (generalized): Secondary | ICD-10-CM | POA: Diagnosis not present

## 2020-04-23 DIAGNOSIS — J9612 Chronic respiratory failure with hypercapnia: Secondary | ICD-10-CM

## 2020-04-23 DIAGNOSIS — Z6841 Body Mass Index (BMI) 40.0 and over, adult: Secondary | ICD-10-CM

## 2020-04-23 DIAGNOSIS — R2681 Unsteadiness on feet: Secondary | ICD-10-CM

## 2020-04-23 NOTE — Patient Instructions (Signed)
Will arrange for chest xray, pulmonary function test, and home sleep study  Will call to arrange for follow up after home sleep study reviewed
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
## 2020-04-23 NOTE — Progress Notes (Signed)
[facility]  Chief Complaint  Patient presents with  . Consult    Patient and wife states he stops breathing during sleep, wakes up gasping for air, snores. Feels like breathing is good, denies cough    Constitutional:  BP 122/80 (BP Location: Right Arm, Patient Position: Sitting, Cuff Size: Large)   Pulse (!) 103   Temp (!) 96.9 F (36.1 C) (Temporal)   Ht 5\' 7"  (1.702 m)   Wt (!) 362 lb (164.2 kg)   SpO2 94%   BMI 56.70 kg/m   Past Medical History:  ARDS from COVID 19 Pneumonia May 2021, HTN, DM type 2, Abscess in upper back, GERD  Past Surgical History:  His  has a past surgical history that includes Irrigation and debridement sabaceous cyst (Right, 07/11/2018).  Brief Summary:  Clifford Nichols is a 39 y.o. male former smoker with sleep disordered breathing.       Subjective:   He was in hospital in May and June 2021 for ARDS from COVID 19 pneumonia.  He was treated with remdesivir, dexamethasone, and tocilizumab.  This was complicated by Staphylococcus epidermidis and Staphylococcus hominis bacteremia, and Pseudomonas/Acinetobacter/Proteus/Entococcus bacteremia.  He was on ventilator from 12/03/19 to 12/20/19, and then had tracheostomy on 12/20/19.  He was decannulated on 01/17/20 and then transitioned to nocturnal CPAP.  He was transferred to inpatient rehab and discharged from there on 01/24/20.    He was sent home with supplemental oxygen, but hasn't needed to use it much recently.  He doesn't have a pulse oximeter at home.  He goes to outpatient PT.  He says his oxygen level stays okay at PT.  He gets winded easily.  Not having cough, wheeze, or sputum.  He was supposed to have a sleep study set up over the Summer, but says this was cancelled by insurance for some reason.  He continues to have snoring and his wife says he frequently stops breathing at night.  He is a restless sleeper and wakes up frequently.  He is sleepy during  the day and can fall asleep easily when sitting quiet.  His Epworth score is 20 out of 24.  Physical Exam:   Appearance - well kempt, sitting in wheelchair  ENMT - no sinus tenderness, no oral exudate, no LAN, Mallampati 4 airway, no stridor, enlarged tongue, healed tracheostomy scar site  Respiratory - equal breath sounds bilaterally, no wheezing or rales  CV - s1s2 regular rate and rhythm, no murmurs  Ext - 1+ non pitting edema of lower legs  Skin - no rashes  Psych - normal mood and affect   Pulmonary testing:    Chest Imaging:    Sleep Tests:    Cardiac Tests:   Echo 12/09/19 >> EF 55 to 60%, mild LVH, mod RV enlargement  Social History:  He  reports that he quit smoking about 2 years ago. His smoking use included cigarettes. He smoked 0.50 packs per day. He has never used smokeless tobacco. He reports previous alcohol use. He reports that he does not use drugs.  Family History:  His family history includes Hypertension in his father.     Assessment/Plan:   Sleep disordered breathing. - will need to arrange for home  - will then arrange for auto CPAP machine - then get CPAP download and overnight oximetry with auto CPAP, and determine if he needs to have an in lab titration study  Dyspnea on exertion with history of ARDS  from COVID 19 pneumonia. - will arrange for chest xray and pulmonary function test to assess current status of his respiratory anatomy and lung mechanics - depending on results will determine if he needs high resolution CT chest  Chronic respiratory failure with hypoxia/hypercapnia. 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 - he is working with outpatient PT - he is aware of how  his weight can impact his health  Time Spent Involved in Patient Care on Day of Examination:  40 minutes  Follow up:  Patient Instructions  Will arrange for chest xray, pulmonary function test, and home sleep study  Will call to arrange for follow up after home sleep study reviewed   Medication List:   Allergies as of 04/23/2020   No Known Allergies     Medication List       Accurate as of April 23, 2020 11:16 AM. If you have any questions, ask your nurse or doctor.        acetaminophen 325 MG tablet Commonly known as: TYLENOL Take 1-2 tablets (325-650 mg total) by mouth every 4 (four) hours as needed for mild pain.   ascorbic acid 500 MG tablet Commonly known as: VITAMIN C Take 1 tablet (500 mg total) by mouth daily.   DULoxetine 60 MG capsule Commonly known as: CYMBALTA Take 1 capsule (60 mg total) by mouth daily.   gabapentin 800 MG tablet Commonly known as: NEURONTIN Take 800 mg by mouth at bedtime.   gabapentin 300 MG capsule Commonly known as: NEURONTIN Take 2 capsules (600 mg total) by mouth 3 (three) times daily.   metFORMIN 500 MG tablet Commonly known as: GLUCOPHAGE Take 1 tablet (500 mg total) by mouth daily with breakfast.   multivitamin with minerals Tabs tablet Take 1 tablet by mouth daily.   zinc sulfate 220 (50 Zn) MG capsule Take 1 capsule (220 mg total) by mouth daily.       Signature:  Coralyn Helling, MD [facility] Pulmonary/Critical Care Pager - [phone] 04/23/2020, 11:16 AM

## 2020-04-28 ENCOUNTER — Ambulatory Visit: Payer: BLUE CROSS/BLUE SHIELD | Admitting: Physical Therapy

## 2020-04-30 ENCOUNTER — Ambulatory Visit: Payer: BLUE CROSS/BLUE SHIELD | Attending: Physical Medicine & Rehabilitation | Admitting: Physical Therapy

## 2020-04-30 ENCOUNTER — Encounter: Payer: Self-pay | Admitting: Physical Therapy

## 2020-04-30 ENCOUNTER — Other Ambulatory Visit: Payer: Self-pay

## 2020-04-30 ENCOUNTER — Ambulatory Visit: Payer: BLUE CROSS/BLUE SHIELD | Admitting: Physical Therapy

## 2020-04-30 DIAGNOSIS — R278 Other lack of coordination: Secondary | ICD-10-CM | POA: Diagnosis present

## 2020-04-30 DIAGNOSIS — G6281 Critical illness polyneuropathy: Secondary | ICD-10-CM | POA: Diagnosis present

## 2020-04-30 DIAGNOSIS — M6281 Muscle weakness (generalized): Secondary | ICD-10-CM | POA: Insufficient documentation

## 2020-04-30 DIAGNOSIS — R2689 Other abnormalities of gait and mobility: Secondary | ICD-10-CM | POA: Diagnosis present

## 2020-04-30 DIAGNOSIS — J9621 Acute and chronic respiratory failure with hypoxia: Secondary | ICD-10-CM | POA: Insufficient documentation

## 2020-04-30 DIAGNOSIS — R2681 Unsteadiness on feet: Secondary | ICD-10-CM | POA: Diagnosis not present

## 2020-04-30 DIAGNOSIS — J9622 Acute and chronic respiratory failure with hypercapnia: Secondary | ICD-10-CM
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
## 2020-05-05 ENCOUNTER — Ambulatory Visit: Payer: BLUE CROSS/BLUE SHIELD | Admitting: Physical Therapy

## 2020-05-05 ENCOUNTER — Ambulatory Visit: Payer: No Typology Code available for payment source

## 2020-05-05 ENCOUNTER — Other Ambulatory Visit: Payer: Self-pay

## 2020-05-05 DIAGNOSIS — G4733 Obstructive sleep apnea (adult) (pediatric): Secondary | ICD-10-CM | POA: Diagnosis not present

## 2020-05-05 DIAGNOSIS — R0683 Snoring: Secondary | ICD-10-CM

## 2020-05-07 ENCOUNTER — Encounter
Payer: No Typology Code available for payment source | Attending: Registered Nurse | Admitting: Physical Medicine & Rehabilitation

## 2020-05-07 ENCOUNTER — Encounter: Payer: Self-pay | Admitting: Physical Medicine & Rehabilitation

## 2020-05-07 ENCOUNTER — Other Ambulatory Visit: Payer: Self-pay

## 2020-05-07 VITALS — BP 129/88 | HR 118 | Temp 98.2°F | Ht 67.0 in | Wt 358.0 lb

## 2020-05-07 DIAGNOSIS — G5700 Lesion of sciatic nerve, unspecified lower limb: Secondary | ICD-10-CM

## 2020-05-07 NOTE — Patient Instructions (Signed)
EMG showing evidence of sciatic nerve injury in the posterior thigh area, RIght and Left sides  Will see you in 3 months, will probably repeat EMG in ~7months  Continue with hamstring exercise, wear braces whenever up

## 2020-05-07 NOTE — Progress Notes (Signed)
EMG/NCV performed today bilateral lower extremities.  This demonstrated complete sciatic nerve injury bilaterally.  Because of body habitus proximal muscle groups such as the gluteus medius were not accessible even with 75 mm needle electrode.  Therefore the exact level of injury is difficult to determine. Please refer to scanned document under media tab in chart review for full report.

## 2020-05-08 ENCOUNTER — Telehealth: Payer: Self-pay | Admitting: Pulmonary Disease

## 2020-05-08 DIAGNOSIS — G4733 Obstructive sleep apnea (adult) (pediatric): Secondary | ICD-10-CM

## 2020-05-08 NOTE — Telephone Encounter (Signed)
HST 05/05/20 >> AHI 83, SpO2 low 56%.  Spent 224.2 min with SpO2 < 89%.   Please inform him that his sleep study shows severe obstructive sleep apnea.  Please arrange for ROV with me or NP to discuss treatment options.  Okay to do tele visit if he prefers.

## 2020-05-08 NOTE — Telephone Encounter (Signed)
Called and spoke with patient's wife, Renae Gloss per [facility] about HST result. All questions answered and Telicla expressed understanding. Offered either office visit or a televisit. Scheduled office visit per wife request in [place] for Monday 06/15/2020 at 3:30pm with Mosetta Anis NP. Wife agreeable to time, date and location. Nothing further needed at this time.

## 2020-05-12 ENCOUNTER — Ambulatory Visit: Payer: BLUE CROSS/BLUE SHIELD

## 2020-05-12 ENCOUNTER — Other Ambulatory Visit: Payer: Self-pay

## 2020-05-12 DIAGNOSIS — M6281 Muscle weakness (generalized): Secondary | ICD-10-CM

## 2020-05-12 DIAGNOSIS — R2681 Unsteadiness on feet: Secondary | ICD-10-CM

## 2020-05-12 DIAGNOSIS — R2689 Other abnormalities of gait and mobility: Secondary | ICD-10-CM
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
## 2020-05-13 NOTE — Telephone Encounter (Signed)
I spoke with pt's wife over the phone.  Explained that his sleep study shows very severe OSA and low oxygen.  Explained the he needs to have ROV sooner than 06/15/20.  Explained that a telephone visit would suffice and he doesn't need to be seen in person in [place] office to review sleep study results.  She is okay with rescheduling ROV for telephone visit.  She requested that this visit be scheduled on a Tuesday or a Thursday.  Visit can be with me or one of the NPs.

## 2020-05-13 NOTE — Telephone Encounter (Signed)
Called and spoke to patient wife, Renae Gloss per [facility] and rescheduled for a televisit with Bufford Spikes NP tomorrow 05/14/21 at 11am per Dr Craige Cotta. Tel[place] expressed understanding and agreeable to time and date. Nothing further needed at this time.

## 2020-05-14 ENCOUNTER — Ambulatory Visit (INDEPENDENT_AMBULATORY_CARE_PROVIDER_SITE_OTHER): Payer: No Typology Code available for payment source | Admitting: Pulmonary Disease

## 2020-05-14 ENCOUNTER — Encounter: Payer: Self-pay | Admitting: Pulmonary Disease

## 2020-05-14 ENCOUNTER — Other Ambulatory Visit: Payer: Self-pay

## 2020-05-14 ENCOUNTER — Ambulatory Visit: Payer: BLUE CROSS/BLUE SHIELD

## 2020-05-14 DIAGNOSIS — G4733 Obstructive sleep apnea (adult) (pediatric): Secondary | ICD-10-CM | POA: Diagnosis not present

## 2020-05-14 DIAGNOSIS — U071 COVID-19: Secondary | ICD-10-CM

## 2020-05-14 DIAGNOSIS — Z6841 Body Mass Index (BMI) 40.0 and over, adult: Secondary | ICD-10-CM

## 2020-05-14 DIAGNOSIS — J9600 Acute respiratory failure, unspecified whether with hypoxia or hypercapnia: Secondary | ICD-10-CM

## 2020-05-14 DIAGNOSIS — Z8616 Personal history of COVID-19: Secondary | ICD-10-CM

## 2020-05-14 NOTE — Progress Notes (Signed)
Reviewed and agree with assessment/plan.   Coralyn Helling, MD [facility] Pulmonary/Critical Care 05/14/2020, 7:16 PM Pager:  [phone]

## 2020-05-14 NOTE — Assessment & Plan Note (Signed)
Patient was hospitalized in May as well as in June/2021 for ARDS from COVID-19 pneumonia.  He was treated with remdesivir, dexamethasone, Actemra.  His course was complicated by Staph epidermidis and Staphylococcus hominis bacteremia and Pseudomonas/actinobacter/Proteus/Enterococcus bacteremia.  He was on a ventilator from 12/03/2019 to 12/20/2019.  He had a tracheostomy placed on 12/20/2019.  He was then decannulated on 01/17/2020.  Plan: Continue work with physical therapy Continue to monitor oxygen saturations We will order CPAP titration Start CPAP therapy 8-week follow-up in office We will check on status of pulmonary function test ordered at [facility] regional

## 2020-05-14 NOTE — Assessment & Plan Note (Addendum)
Plan: Obtain CPAP titration Complete pulmonary function test At next office visit consider walk in office to assess oxygen saturations with physical exertion

## 2020-05-14 NOTE — Assessment & Plan Note (Signed)
Reviewed home sleep study results Discussed pathophysiology of severe obstructive sleep apnea  Plan: We will place order to start CPAP therapy We will order CPAP titration to be completed in [place], [place] We will send message to [employer]ME representatives to see if this can be expedited given the severity of the patient's breathing 8-week follow-up with Dr. Craige Cotta or with myself in [place] clinic

## 2020-05-14 NOTE — Patient Instructions (Addendum)
You were seen today by Coral Ceo, NP  for:   1. OSA (obstructive sleep apnea)  - Cpap titration; Future  Start CPAP therapy New CPAP start Mask of choice APAP setting 5-15 DME: [name] [facility] [employer]  We will also order a CPAP titration to further evaluate your pressure setting needs as well as oxygenation when using CPAP  We recommend that you start using your CPAP daily >>>Keep up the hard work using your device >>> Goal should be wearing this for the entire night that you are sleeping, at least 4 to 6 hours  Remember:  . Do not drive or operate heavy machinery if tired or drowsy.  . Please notify the supply company and office if you are unable to use your device regularly due to missing supplies or machine being broken.  . Work on maintaining a healthy weight and following your recommended nutrition plan  . Maintain proper daily exercise and movement  . Maintaining proper use of your device can also help improve management of other chronic illnesses such as: Blood pressure, blood sugars, and weight management.   BiPAP/ CPAP Cleaning:  >>>Clean weekly, with Dawn soap, and bottle brush.  Set up to air dry. >>> Wipe mask out daily with wet wipe or towelette    2. History of COVID-19 3. Acute respiratory failure due to COVID-19 [facility])  Obtain pulmonary function testing as previously ordered at [facility] regional  Continue to work with physical therapy  Continue to work on reducing your overall BMI  4. Morbid obesity with BMI of 50.0-59.9, adult (HCC)  Continue to work on increasing your overall physical mobility and working on physical therapy  May need to consider referral to medical weight management to work on reducing BMI   We recommend today:  Orders Placed This Encounter  Procedures  . Cpap titration    Standing Status:   Future    Standing Expiration Date:   05/14/2021    Order Specific Question:   Where should this test be performed:    Answer:    [place]   Orders Placed This Encounter  Procedures  . Cpap titration   No orders of the defined types were placed in this encounter.   Follow Up:    Return in about 2 months (around 07/14/2020), or if symptoms worsen or fail to improve, for [facility].   Notification of test results are managed in the following manner: If there are  any recommendations or changes to the  plan of care discussed in office today,  we will contact you and let you know what they are. If you do not hear from [place], then your results are normal and you can view them through your  MyChart account , or a letter will be sent to you. Thank you again for trusting [place] with your care  - Thank you, [facility]    It is flu season:   >>> Best ways to protect herself from the flu: Receive the yearly flu vaccine, practice good hand hygiene washing with soap and also using hand sanitizer when available, eat a nutritious meals, get adequate rest, hydrate appropriately       Please contact the office if your symptoms worsen or you have concerns that you are not improving.   Thank you for choosing [facility] for your healthcare, and for allowing [place] to partner with you on your healthcare journey. I am thankful to be able to provide care to you  today.   Elisha Headland FNP-C    Sleep Apnea Sleep apnea affects breathing during sleep. It causes breathing to stop for a short time or to become shallow. It can also increase the risk of:  Heart attack.  Stroke.  Being very overweight (obese).  Diabetes.  Heart failure.  Irregular heartbeat. The goal of treatment is to help you breathe normally again. What are the causes? There are three kinds of sleep apnea:  Obstructive sleep apnea. This is caused by a blocked or collapsed airway.  Central sleep apnea. This happens when the brain does not send the right signals to the muscles that control breathing.  Mixed sleep apnea. This  is a combination of obstructive and central sleep apnea. The most common cause of this condition is a collapsed or blocked airway. This can happen if:  Your throat muscles are too relaxed.  Your tongue and tonsils are too large.  You are overweight.  Your airway is too small. What increases the risk?  Being overweight.  Smoking.  Having a small airway.  Being older.  Being male.  Drinking alcohol.  Taking medicines to calm yourself (sedatives or tranquilizers).  Having family members with the condition. What are the signs or symptoms?  Trouble staying asleep.  Being sleepy or tired during the day.  Getting angry a lot.  Loud snoring.  Headaches in the morning.  Not being able to focus your mind (concentrate).  Forgetting things.  Less interest in sex.  Mood swings.  Personality changes.  Feelings of sadness (depression).  Waking up a lot during the night to pee (urinate).  Dry mouth.  Sore throat. How is this diagnosed?  Your medical history.  A physical exam.  A test that is done when you are sleeping (sleep study). The test is most often done in a sleep lab but may also be done at home. How is this treated?   Sleeping on your side.  Using a medicine to get rid of mucus in your nose (decongestant).  Avoiding the use of alcohol, medicines to help you relax, or certain pain medicines (narcotics).  Losing weight, if needed.  Changing your diet.  Not smoking.  Using a machine to open your airway while you sleep, such as: ? An oral appliance. This is a mouthpiece that shifts your lower jaw forward. ? A CPAP device. This device blows air through a mask when you breathe out (exhale). ? An EPAP device. This has valves that you put in each nostril. ? A BPAP device. This device blows air through a mask when you breathe in (inhale) and breathe out.  Having surgery if other treatments do not work. It is important to get treatment for sleep  apnea. Without treatment, it can lead to:  High blood pressure.  Coronary artery disease.  In men, not being able to have an erection (impotence).  Reduced thinking ability. Follow these instructions at home: Lifestyle  Make changes that your doctor recommends.  Eat a healthy diet.  Lose weight if needed.  Avoid alcohol, medicines to help you relax, and some pain medicines.  Do not use any products that contain nicotine or tobacco, such as cigarettes, e-cigarettes, and chewing tobacco. If you need help quitting, ask your doctor. General instructions  Take over-the-counter and prescription medicines only as told by your doctor.  If you were given a machine to use while you sleep, use it only as told by your doctor.  If you are having surgery, make  sure to tell your doctor you have sleep apnea. You may need to bring your device with you.  Keep all follow-up visits as told by your doctor. This is important. Contact a doctor if:  The machine that you were given to use during sleep bothers you or does not seem to be working.  You do not get better.  You get worse. Get help right away if:  Your chest hurts.  You have trouble breathing in enough air.  You have an uncomfortable feeling in your back, arms, or stomach.  You have trouble talking.  One side of your body feels weak.  A part of your face is hanging down. These symptoms may be an emergency. Do not wait to see if the symptoms will go away. Get medical help right away. Call your local emergency services (911 in the [place] Do not drive yourself to the hospital. Summary  This condition affects breathing during sleep.  The most common cause is a collapsed or blocked airway.  The goal of treatment is to help you breathe normally while you sleep. This information is not intended to replace advice given to you by your health care provider. Make sure you discuss any questions you have with your health care  provider. Document Revised: 04/27/2018 Document Reviewed: 03/06/2018 Elsevier Patient Education  2020 [employer].

## 2020-05-14 NOTE — Assessment & Plan Note (Signed)
Plan: Continue work with physical therapy Continue work on reducing BMI May need to consider referral to medical weight management in the future

## 2020-05-14 NOTE — Progress Notes (Signed)
Virtual Visit via Telephone Note  I connected with Clifford Nichols on 05/14/20 at 11:00 AM EDT by telephone and verified that I am speaking with the correct person using two identifiers.  Location: Patient: Home Provider: Office [facility] - [address] [place], [address], [place], [place] [address]   I discussed the limitations, risks, security and privacy concerns of performing an evaluation and management service by telephone and the availability of in person appointments. I also discussed with the patient that there may be a patient responsible charge related to this service. The patient expressed understanding and agreed to proceed.  Patient consented to consult via telephone: Yes People present and their role in pt care: Pt    History of Present Illness:  39 year old male former smoker followed in our office for severe obstructive sleep apnea and history of ARDS status post COVID-19 infection requiring long hospitalization with multidrug-resistant organism infections.  Past medical history: Morbid obesity, obesity hypoventilation syndrome suspected, hypertension, chronic pain syndrome, type 2 diabetes Smoking history: Former smoker.  Quit June/2019 Maintenance: Patient of Dr. Craige Cotta  Chief complaint: Review home sleep study results, discuss treatment strategies  39 year old male former smoker completing a televisit with our office today to discuss home sleep study results.  Most recent home sleep study shows severe obstructive sleep apnea with an AHI of 84.  It was recommended that he start CPAP therapy as well as obtain a CPAP titration.  We will review these results today.  Patient was hospitalized in May as well as in June/2021 for ARDS from COVID-19 pneumonia.  He was treated with remdesivir, dexamethasone, Actemra.  His course was complicated by Staph epidermidis and Staphylococcus hominis bacteremia and Pseudomonas/actinobacter/Proteus/Enterococcus bacteremia.  He was on a  ventilator from 12/03/2019 to 12/20/2019.  He had a tracheostomy placed on 12/20/2019.  He was then decannulated on 01/17/2020.   Patient reporting that his breathing does still slowly improve.  He still does not feel he is anywhere close to baseline.  He continues to work with physical therapy.  Observations/Objective:  HST 05/05/20 >> AHI 83, SpO2 low 56%.  Spent 224.2 min with SpO2 < 89%.  Echo 12/09/19 >> EF 55 to 60%, mild LVH, mod RV enlargement  Social History   Tobacco Use  Smoking Status Former Smoker  . Packs/day: 0.50  . Types: Cigarettes  . Quit date: 12/31/2017  . Years since quitting: 2.3  Smokeless Tobacco Never Used    There is no immunization history on file for this patient.    Assessment and Plan:  History of COVID-19 Patient was hospitalized in May as well as in June/2021 for ARDS from COVID-19 pneumonia.  He was treated with remdesivir, dexamethasone, Actemra.  His course was complicated by Staph epidermidis and Staphylococcus hominis bacteremia and Pseudomonas/actinobacter/Proteus/Enterococcus bacteremia.  He was on a ventilator from 12/03/2019 to 12/20/2019.  He had a tracheostomy placed on 12/20/2019.  He was then decannulated on 01/17/2020.  Plan: Continue work with physical therapy Continue to monitor oxygen saturations We will order CPAP titration Start CPAP therapy 8-week follow-up in office We will check on status of pulmonary function test ordered at [facility]  OSA (obstructive sleep apnea) Reviewed home sleep study results Discussed pathophysiology of severe obstructive sleep apnea  Plan: We will place order to start CPAP therapy We will order CPAP titration to be completed in [place], [place] We will send message to [employer] representatives to see if this can be expedited given the  severity of the patient's breathing 8-week follow-up with Dr. Craige Cotta or with myself in [facility] clinic  Acute respiratory failure due to COVID-19  [facility]) Plan: Obtain CPAP titration Complete pulmonary function test At next office visit consider walk in office to assess oxygen saturations with physical exertion  Morbid obesity with BMI of 50.0-59.9, adult (HCC) Plan: Continue work with physical therapy Continue work on reducing BMI May need to consider referral to medical weight management in the future   Follow Up Instructions:  Return in about 2 months (around 07/14/2020), or if symptoms worsen or fail to improve, for [facility], Follow up with Elisha Headland FNP-C, Follow up with Dr. Craige Cotta.   I discussed the assessment and treatment plan with the patient. The patient was provided an opportunity to ask questions and all were answered. The patient agreed with the plan and demonstrated an understanding of the instructions.   The patient was advised to call back or seek an in-person evaluation if the symptoms worsen or if the condition fails to improve as anticipated.  I provided 28 minutes of non-face-to-face time during this encounter.   Coral Ceo, NP

## 2020-05-19 ENCOUNTER — Ambulatory Visit: Payer: BLUE CROSS/BLUE SHIELD | Admitting: Physical Therapy

## 2020-05-21 ENCOUNTER — Ambulatory Visit: Payer: BLUE CROSS/BLUE SHIELD | Admitting: Physical Therapy

## 2020-05-25 ENCOUNTER — Ambulatory Visit: Payer: BLUE CROSS/BLUE SHIELD

## 2020-05-27 ENCOUNTER — Ambulatory Visit: Payer: BLUE CROSS/BLUE SHIELD

## 2020-06-01 ENCOUNTER — Ambulatory Visit: Payer: No Typology Code available for payment source

## 2020-06-03 ENCOUNTER — Ambulatory Visit: Payer: No Typology Code available for payment source

## 2020-06-08 ENCOUNTER — Ambulatory Visit: Payer: No Typology Code available for payment source

## 2020-06-10 ENCOUNTER — Ambulatory Visit: Payer: No Typology Code available for payment source

## 2020-06-15 ENCOUNTER — Ambulatory Visit: Payer: No Typology Code available for payment source | Admitting: Primary Care

## 2020-06-15 ENCOUNTER — Ambulatory Visit: Payer: No Typology Code available for payment source

## 2020-06-17 ENCOUNTER — Ambulatory Visit: Payer: No Typology Code available for payment source

## 2020-06-23 ENCOUNTER — Encounter
Payer: No Typology Code available for payment source | Attending: Registered Nurse | Admitting: Physical Medicine & Rehabilitation

## 2020-06-23 ENCOUNTER — Encounter: Payer: Self-pay | Admitting: Physical Medicine & Rehabilitation

## 2020-06-23 ENCOUNTER — Other Ambulatory Visit: Payer: Self-pay

## 2020-06-23 VITALS — BP 176/118 | HR 99 | Temp 98.6°F | Ht 67.0 in | Wt 372.0 lb

## 2020-06-23 DIAGNOSIS — G5700 Lesion of sciatic nerve, unspecified lower limb: Secondary | ICD-10-CM | POA: Diagnosis present

## 2020-06-23 NOTE — Patient Instructions (Signed)
Do hamstrig exercise on your stomach, bend legs every other day, 3 sets 10  Do sit to stand repetitions 3 set 10

## 2020-06-23 NOTE — Progress Notes (Signed)
Subjective:    Patient ID: Clifford Nichols, male    DOB: 1980-08-21, 39 y.o.   MRN: [id number] 39 y.o. male with history of HTN-no meds times couple of months, untreated OSA, morbid obesity--BMI 52.9 who was admitted on 12/02/2019 with acute respiratory failure and ARDS from COVID-19 PNA. He required intubation and was treated with Tocilizumab, remdesivir, Decadron as well as broad-spectrum antibiotics. He was not felt to be a candidate for ECMO due to markedly elevated BMI with patient with proning as well as ketamine. Hospital course significant for septic shock as well as HCAP treated with multiple antibiotics. He continued to have ongoing fevers and positive blood cultures felt to be due to contaminant and antibiotics eventually discontinued by 06/01. He was found to have intermittent left proptosis and CT of head was negative period wife reported this was a chronic problem and patient to follow-up with ophthalmology past discharge.   He did have acute blood loss anemia with thrombocytopenia requiring 2 units PRBCs, required tracheostomy 05/28 and was extubated to ATC. He was briefly downsized to a cuffless trach but changed back to cuffed #6 XLT due to need for TTS vent at night. He was weaned off BiPAP and respiratory status stable on 5 L oxygen.  Stage III sacral decub was being treated with hydrotherapy. Pain bilateral feet was felt to be due to plantar fasciitis and treated with oxycodone as needed. He is tolerating clears and diet was advanced to regular with thin liquids. He is tolerating PMV trials with improvement in activity tolerance. Therapy ongoing and CIR was recommended due to critical illness polyneuropathy with functional decline. Admit date: 01/13/2020 Discharge date: 01/24/2020 HPI  EMG/NCV performed 05/07/2020 showed complete sciatic nerve injury bilaterally at level of upper thigh, lower buttocks Patient feels like he has not had any additional recovery in his lower extremities.   Still has numbness in both feet on the top and the bottom.  No bowel or bladder dysfunction no back pain.  Patient requires assistance to get his shoes and socks on and off.  He is otherwise independent with his dressing.  He is independent with  bathing.  The patient continues to use a walker for ambulation Pain Inventory Average Pain 6 Pain Right Now 6 My pain is sharp, stabbing, tingling and aching  In the last 24 hours, has pain interfered with the following? General activity 4 Relation with others 4 Enjoyment of life 2 What TIME of day is your pain at its worst? night Sleep (in general) Poor  Pain is worse with: walking, sitting, inactivity, standing and some activites Pain improves with: medication Relief from Meds: 4  Family History  Problem Relation Age of Onset  . Hypertension Father    Social History   Socioeconomic History  . Marital status: Married    Spouse name: Not on file  . Number of children: Not on file  . Years of education: Not on file  . Highest education level: Not on file  Occupational History  . Not on file  Tobacco Use  . Smoking status: Former Smoker    Packs/day: 0.50    Types: Cigarettes    Quit date: 12/31/2017    Years since quitting: 2.4  . Smokeless tobacco: Never Used  Vaping Use  . Vaping Use: Never used  Substance and Sexual Activity  . Alcohol use: Not Currently  . Drug use: No  . Sexual activity: Yes  Other Topics Concern  . Not on file  Social  History Narrative  . Not on file   Social Determinants of Health   Financial Resource Strain:   . Difficulty of Paying Living Expenses: Not on file  Food Insecurity:   . Worried About Programme researcher, broadcasting/film/video in the Last Year: Not on file  . Ran Out of Food in the Last Year: Not on file  Transportation Needs:   . Lack of Transportation (Medical): Not on file  . Lack of Transportation (Non-Medical): Not on file  Physical Activity:   . Days of Exercise per Week: Not on file  . Minutes of  Exercise per Session: Not on file  Stress:   . Feeling of Stress : Not on file  Social Connections:   . Frequency of Communication with Friends and Family: Not on file  . Frequency of Social Gatherings with Friends and Family: Not on file  . Attends Religious Services: Not on file  . Active Member of Clubs or Organizations: Not on file  . Attends Banker Meetings: Not on file  . Marital Status: Not on file   Past Surgical History:  Procedure Laterality Date  . IRRIGATION AND DEBRIDEMENT SEBACEOUS CYST Right 07/11/2018   Procedure: EXCISION  SEBACEOUS CYST POSTERIOR SHOULDER AND MID-BACK;  Surgeon: Ancil Linsey, MD;  Location: [facility] ORS;  Service: General;  Laterality: Right;   Past Surgical History:  Procedure Laterality Date  . IRRIGATION AND DEBRIDEMENT SEBACEOUS CYST Right 07/11/2018   Procedure: EXCISION  SEBACEOUS CYST POSTERIOR SHOULDER AND MID-BACK;  Surgeon: Ancil Linsey, MD;  Location: [facility] ORS;  Service: General;  Laterality: Right;   Past Medical History:  Diagnosis Date  . Abscess of upper back excluding scapular region   . Acute respiratory distress syndrome (ARDS) due to COVID-19 virus (HCC) 11/2019  . Diabetes mellitus without complication (HCC)   . GERD (gastroesophageal reflux disease)    OCC-NO MEDS  . Hypertension   . Sebaceous cyst    BP (!) 176/118 (BP Location: Right Arm, Patient Position: Sitting, Cuff Size: Large)   Pulse 99   Temp 98.6 F (37 C)   Ht 5\' 7"  (1.702 m)   Wt (!) 372 lb (168.7 kg)   SpO2 92%   BMI 58.26 kg/m   Opioid Risk Score:   Fall Risk Score:  `1  Depression screen PHQ 2/9  Depression screen [facility] 2/9 05/07/2020 04/06/2020 03/24/2020 02/10/2020  Decreased Interest 0 0 0 0  Down, Depressed, Hopeless 0 0 0 0  PHQ - 2 Score 0 0 0 0  Altered sleeping - - - 1  Tired, decreased energy - - - 0  Change in appetite - - - 0  Feeling bad or failure about yourself  - - - 0  Trouble concentrating - - - 0  Moving  slowly or fidgety/restless - - - 0  Suicidal thoughts - - - 0  PHQ-9 Score - - - 1   Review of Systems     Objective:   Physical Exam Vitals and nursing note reviewed.  Constitutional:      Appearance: He is obese.  HENT:     Head: Normocephalic and atraumatic.  Eyes:     Extraocular Movements: Extraocular movements intact.     Conjunctiva/sclera: Conjunctivae normal.     Pupils: Pupils are equal, round, and reactive to light.  Neurological:     Mental Status: He is oriented to person, place, and time.  Psychiatric:        Mood and Affect: Mood  normal.        Behavior: Behavior normal.     Motor strength is 0/5 in bilateral ankle dorsiflexors and plantar flexors is 5/5 strength in the quads 3/5 strength in bilateral hamstrings and to minus bilateral hip extensors. Sensation is reduced to pinprick at lateral malleolus entire dorsum and plantar surface of the foot.  Intact medial malleolus sensation. Ambulates with a walker with bilateral AFOs to compensate for foot drop. Upper extremity strength is 5/5 bilateral deltoid bicep tricep grip hip flexor knee extensor dorsiflexor      Assessment & Plan:  1.  Bilateral proximal sciatic neuropathy.  The patient has had no additional clinical improvement since EMG/NCV performed approximately 6 to 7 weeks ago.  Will repeat testing at approximately 1 year post onset. I will see him back in 3 months to monitor clinically.  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  May be able to return to a sedentary position but his current position requires making security rounds.

## 2020-06-24 ENCOUNTER — Ambulatory Visit: Payer: No Typology Code available for payment source

## 2020-07-16 ENCOUNTER — Telehealth: Payer: Self-pay

## 2020-07-16 NOTE — Telephone Encounter (Signed)
Lm to relay date/time of covid test prior to PFT.   07/20/2020 [facility]

## 2020-07-20 ENCOUNTER — Other Ambulatory Visit: Payer: No Typology Code available for payment source

## 2020-07-20 NOTE — Telephone Encounter (Signed)
LMTCB for the pt 

## 2020-07-21 ENCOUNTER — Ambulatory Visit: Payer: No Typology Code available for payment source

## 2020-07-22 NOTE — Telephone Encounter (Signed)
PFT has been canceled per patient request.  Will close encounter.

## 2020-07-27 ENCOUNTER — Ambulatory Visit: Payer: No Typology Code available for payment source | Admitting: Pulmonary Disease

## 2020-08-06 ENCOUNTER — Ambulatory Visit: Payer: No Typology Code available for payment source | Admitting: Physical Medicine & Rehabilitation

## 2020-09-22 ENCOUNTER — Other Ambulatory Visit: Payer: Self-pay

## 2020-09-22 ENCOUNTER — Encounter: Payer: BLUE CROSS/BLUE SHIELD | Admitting: Physical Medicine & Rehabilitation

## 2020-10-05 ENCOUNTER — Telehealth: Payer: Self-pay

## 2020-10-05 NOTE — Telephone Encounter (Signed)
Janey Genta from [employer] [phone] called from [employer] wanting to know what patient hold up is for documentation.  Per admin there are 3 sets of paperwork completed by MD - I called and advised [employer] waiting on documentation charge for MD to complete.  $25

## 2020-11-27 ENCOUNTER — Encounter: Payer: BLUE CROSS/BLUE SHIELD | Admitting: Physical Medicine & Rehabilitation

## 2020-12-01 ENCOUNTER — Ambulatory Visit: Payer: BLUE CROSS/BLUE SHIELD | Admitting: Physical Medicine & Rehabilitation

## 2020-12-17 ENCOUNTER — Telehealth: Payer: Self-pay | Admitting: Physical Medicine & Rehabilitation

## 2020-12-17 NOTE — Telephone Encounter (Signed)
Patient states needs a letter to employer stating he is unable to return to work. Clifford Nichols fax [phone]

## 2020-12-22 ENCOUNTER — Encounter
Payer: BLUE CROSS/BLUE SHIELD | Attending: Physical Medicine & Rehabilitation | Admitting: Physical Medicine & Rehabilitation

## 2020-12-22 ENCOUNTER — Other Ambulatory Visit: Payer: Self-pay

## 2020-12-22 ENCOUNTER — Telehealth: Payer: Self-pay | Admitting: Physical Medicine & Rehabilitation

## 2020-12-22 ENCOUNTER — Encounter: Payer: Self-pay | Admitting: Physical Medicine & Rehabilitation

## 2020-12-22 VITALS — BP 146/100 | HR 102 | Temp 98.7°F | Ht 67.0 in | Wt 386.0 lb

## 2020-12-22 DIAGNOSIS — G5700 Lesion of sciatic nerve, unspecified lower limb: Secondary | ICD-10-CM | POA: Insufficient documentation

## 2020-12-22 NOTE — Telephone Encounter (Signed)
Attempted to leave message vmb not set up.  Per Dr Bryson Dames, Clifford Nichols has not been seen in greater than 6 months dr cannot complete letter

## 2020-12-22 NOTE — Progress Notes (Signed)
Subjective:    Patient ID: Clifford Nichols, male    DOB: [birth year], 40 y.o.   MRN: [id number]  HPI Clifford Nichols is a 40 year old male with history of COVID-19 infection which resulted in severe pneumonia, respiratory failure, tracheostomy placement, ICU stay.  He had bilateral foot drop that was noted after he started receiving some therapy.  He completed inpatient rehab as well as outpatient rehab without change in foot drop.  He has had EMG/NCV demonstrating severe bilateral sciatic neuropathy.  The patient was last seen in November 2021, EMG was in October 2021.  The patient has not noted any major changes.  He is here today because his work would like to get a letter stating that he cannot return.  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 Pain Inventory Average Pain 6 Pain Right Now 6 My pain is aching  In the last 24 hours, has pain interfered with the following? General activity 0 Relation with others 0 Enjoyment of life 0 What TIME of day is your pain at its worst? night Sleep (in general) Fair  Pain is worse with: inactivity Pain improves with: medication Relief from Meds: 9  Family History  Problem Relation Age of Onset  . Hypertension Father    Social History   Socioeconomic History  . Marital status: Married    Spouse name: Not on file  . Number of children: Not on file  . Years of education: Not on file  . Highest education level: Not on file  Occupational History  . Not on file  Tobacco Use  . Smoking status: Former Smoker    Packs/day: 0.50    Types: Cigarettes    Quit date: 12/31/2017    Years since quitting: 2.9  . Smokeless tobacco: Never Used  Vaping Use  . Vaping Use: Never used  Substance and Sexual Activity  . Alcohol use: Not Currently  . Drug use: No  . Sexual activity: Yes  Other Topics Concern  . Not on file  Social History Narrative  . Not on file   Social Determinants  of Health   Financial Resource Strain: Not on file  Food Insecurity: Not on file  Transportation Needs: Not on file  Physical Activity: Not on file  Stress: Not on file  Social Connections: Not on file   Past Surgical History:  Procedure Laterality Date  . IRRIGATION AND DEBRIDEMENT SEBACEOUS CYST Right 07/11/2018   Procedure: EXCISION  SEBACEOUS CYST POSTERIOR SHOULDER AND MID-BACK;  Surgeon: Ancil Linsey, MD;  Location: [facility] ORS;  Service: General;  Laterality: Right;   Past Surgical History:  Procedure Laterality Date  . IRRIGATION AND DEBRIDEMENT SEBACEOUS CYST Right 07/11/2018   Procedure: EXCISION  SEBACEOUS CYST POSTERIOR SHOULDER AND MID-BACK;  Surgeon: Ancil Linsey, MD;  Location: [facility] ORS;  Service: General;  Laterality: Right;   Past Medical History:  Diagnosis Date  . Abscess of upper back excluding scapular region   . Acute respiratory distress syndrome (ARDS) due to COVID-19 virus (HCC) 11/2019  . Diabetes mellitus without complication (HCC)   . GERD (gastroesophageal reflux disease)    OCC-NO MEDS  . Hypertension   . Sebaceous cyst    BP (!) 188/121   Pulse (!) 102   Temp 98.7 F (37.1 C)   Ht 5\' 7"  (1.702 m)   Wt (!) 386 lb (175.1 kg)   SpO2 91%   BMI 60.46 kg/m  Opioid Risk Score:   Fall Risk Score:  `1  Depression screen PHQ 2/9  Depression screen [facility] 2/9 05/07/2020 04/06/2020 03/24/2020 02/10/2020  Decreased Interest 0 0 0 0  Down, Depressed, Hopeless 0 0 0 0  PHQ - 2 Score 0 0 0 0  Altered sleeping - - - 1  Tired, decreased energy - - - 0  Change in appetite - - - 0  Feeling bad or failure about yourself  - - - 0  Trouble concentrating - - - 0  Moving slowly or fidgety/restless - - - 0  Suicidal thoughts - - - 0  PHQ-9 Score - - - 1    Review of Systems  Constitutional: Negative.   HENT: Negative.   Eyes: Negative.   Respiratory: Negative.   Cardiovascular: Negative.   Gastrointestinal: Negative.   Endocrine: Negative.    Genitourinary: Negative.   Musculoskeletal: Positive for gait problem.       Pain in both legs  Skin: Negative.   Allergic/Immunologic: Negative.   Hematological: Negative.   Psychiatric/Behavioral: Negative.        Objective:   Physical Exam Vitals and nursing note reviewed.  Constitutional:      Appearance: He is obese.  HENT:     Head: Normocephalic and atraumatic.  Eyes:     Extraocular Movements: Extraocular movements intact.     Pupils: Pupils are equal, round, and reactive to light.  Musculoskeletal:     Comments: Bilateral AFOs No pain with lower extremity range of motion  Neurological:     Mental Status: He is alert and oriented to person, place, and time.     Comments: Motor strength is 0/5 in bilateral ankle dorsiflexors and plantar flexors 4 - at the knee flexors 5 at the knee extensors and hip flexors 5 bilateral in the upper extremities. Ambulates with a rolling walker bilateral AFOs is able to clear his feet with the braces on.  His cadence is slow left step length is short and wide base of support.  Psychiatric:        Mood and Affect: Mood normal.        Behavior: Behavior normal.           Assessment & Plan:  1.  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  He will continue to use his AFO and walker to ambulate.  Do not see a need for repeat EMG/NCV.  Physical medicine rehab follow-up on as-needed basis.

## 2020-12-24 ENCOUNTER — Telehealth: Payer: Self-pay

## 2020-12-24 NOTE — Telephone Encounter (Signed)
Ronnell Freshwater with [employer] called:  She received the note stating Jemmie L Musto cannot work in his current role.  1. Is he able to return to work?    2. Or how long will he need to remain out of work?  Will you please create an note containing the above reply?  Call back phone [phone].

## 2021-01-01 ENCOUNTER — Ambulatory Visit: Payer: BLUE CROSS/BLUE SHIELD | Admitting: Physical Medicine & Rehabilitation

## 2021-01-05 ENCOUNTER — Ambulatory Visit: Payer: BLUE CROSS/BLUE SHIELD | Admitting: Physical Medicine & Rehabilitation

## 2021-06-07 NOTE — Telephone Encounter (Signed)
error 

## 2021-09-27 IMAGING — CR DG CHEST 2V
2 series · 3 of 3 positions shown · non-contrast
Comparison: CT chest 04/26/2018, report from chest radiograph
09/12/2000 (images unavailable)

CLINICAL DATA: Shortness of breath. Additional provided: Patient
reports congestion, cough for 3 days, patient reports coughing up
red phlegm, history of diabetes, GERD, hypertension, former smoker.

EXAM:
CHEST - 2 VIEW

[Series 1: chest pa · 0.14mm/px · 2 of 2 slices shown]
[im 1/2]
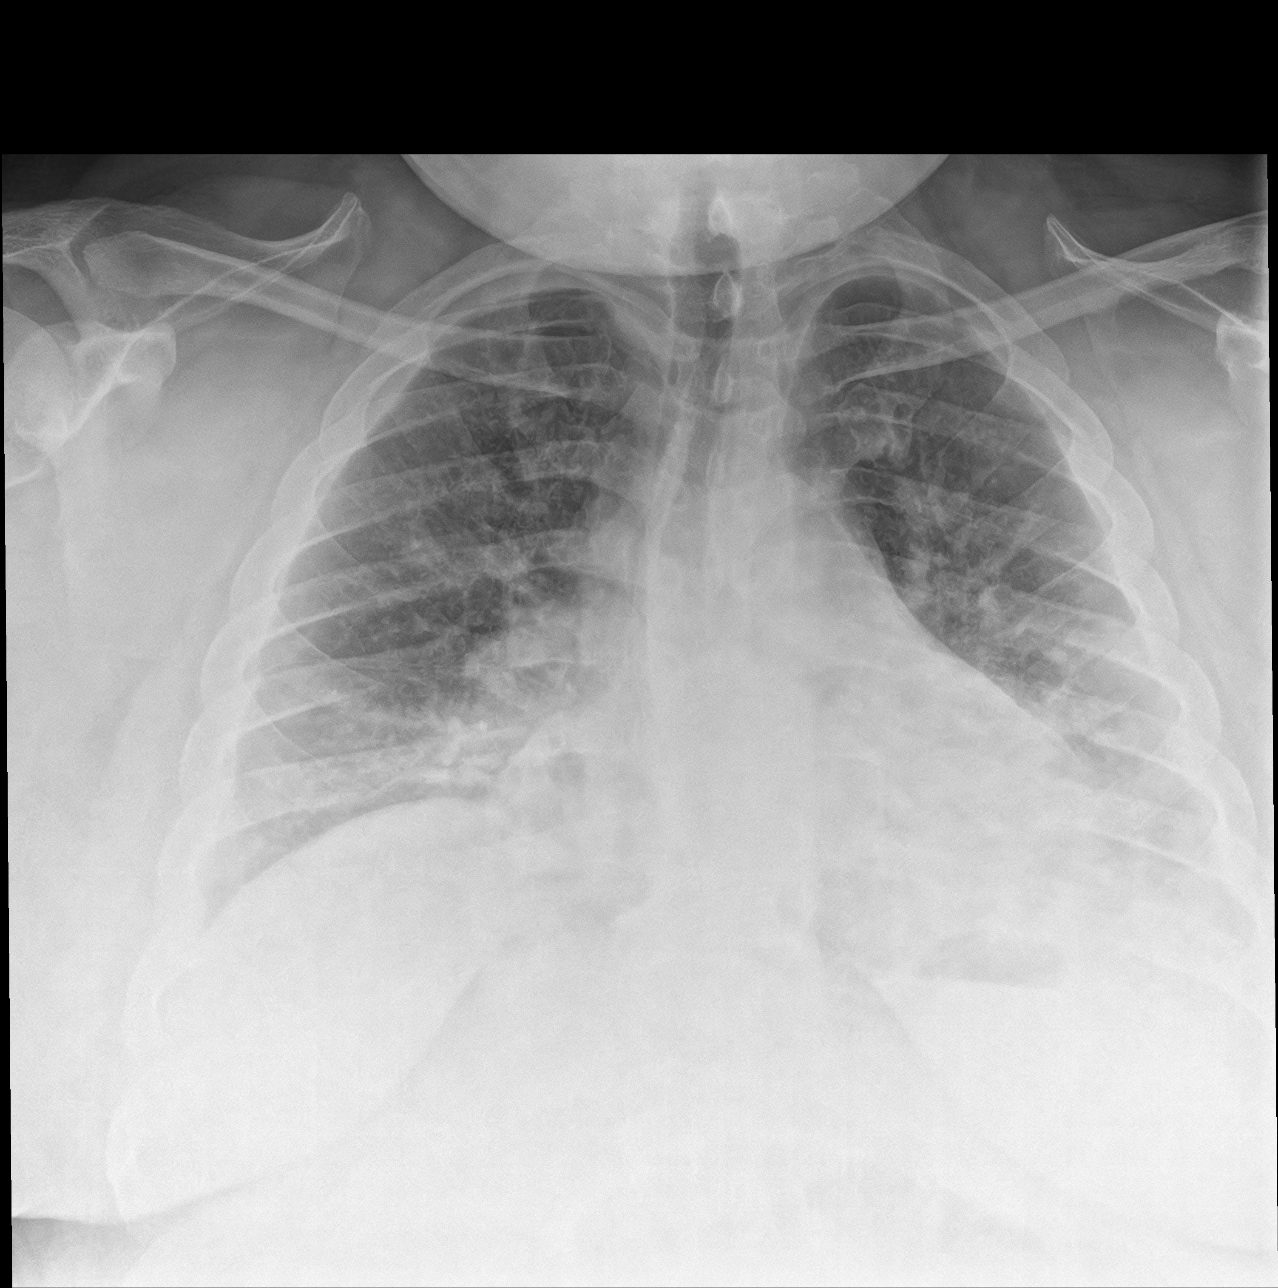
[im 2/2]
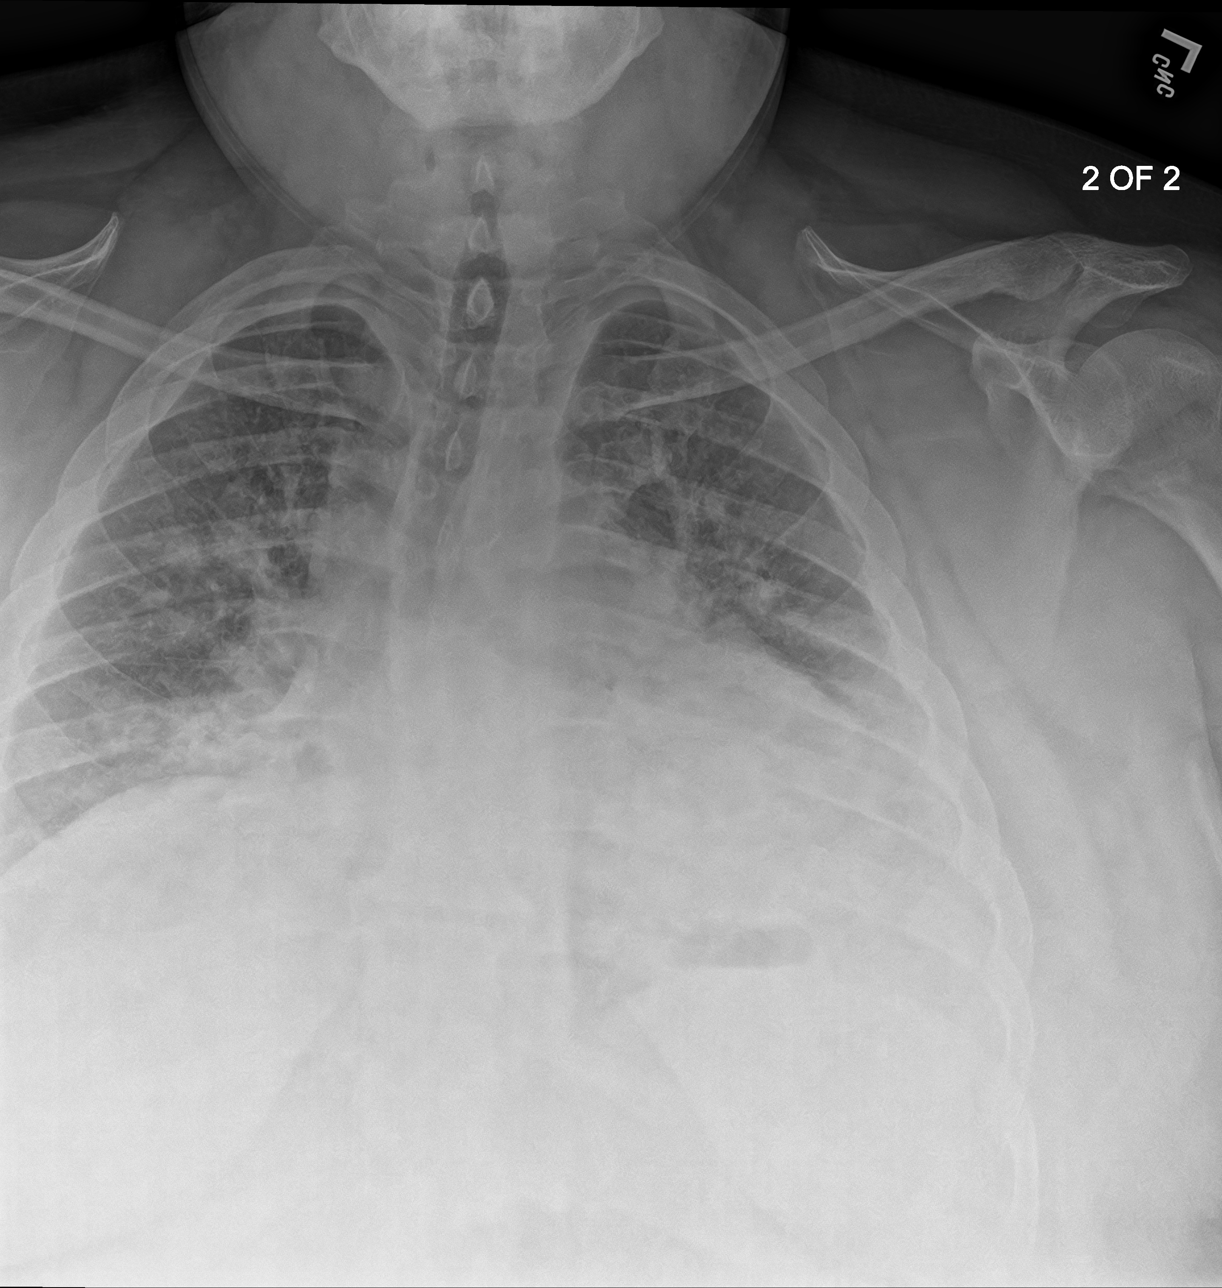

[chest lat]
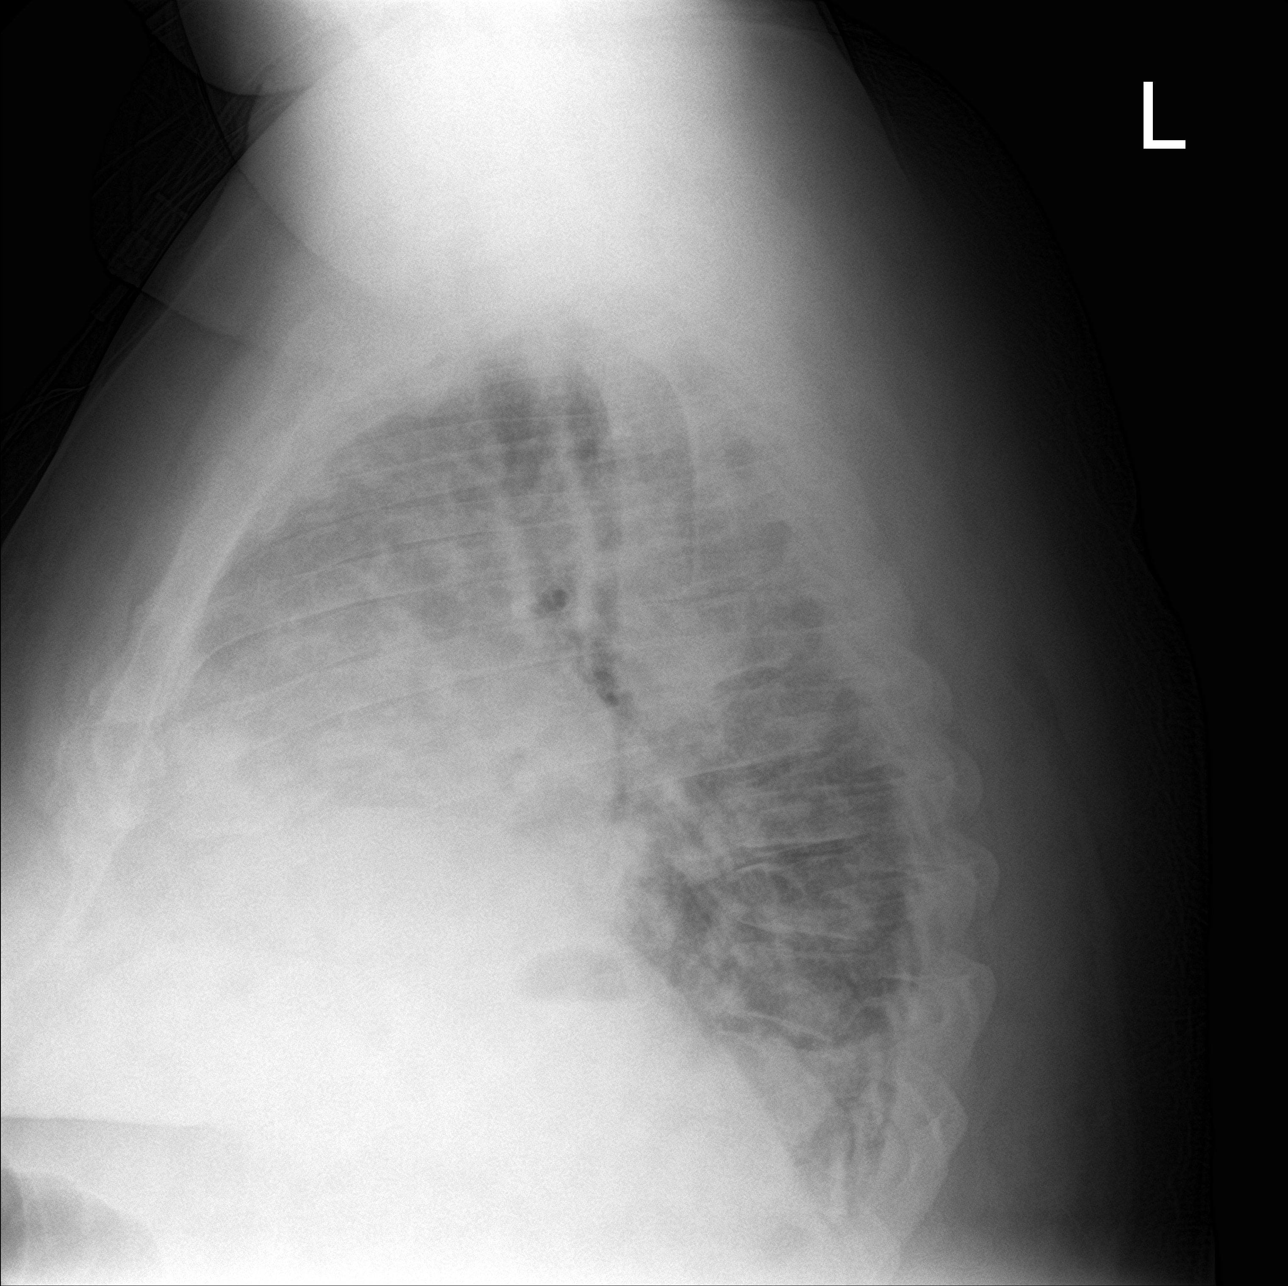

[3 of 3 positions shown; findings below may reference images not displayed]

FINDINGS: The cardiac silhouette appears prominent, but is accentuated on this
shallow inspiration radiograph. Central pulmonary vascular
congestion. There opacities within the mid to lower lung fields
bilaterally. No evidence of pleural effusion or pneumothorax. No
acute bony abnormality. Mild chronic anterior wedging of mid to
lower thoracic vertebrae.
IMPRESSION: Prominence of the cardiac silhouette, although accentuated on this
shallow inspiration radiograph.

Central pulmonary vascular congestion.

Opacities within the mid to lower lung fields bilaterally are
nonspecific, but may reflect edema or pneumonia.

## 2021-09-29 IMAGING — DX DG CHEST 1V PORT
1 series · 2 of 2 positions shown · non-contrast
Comparison: 12/03/2019

CLINICAL DATA: ARDS

EXAM:
PORTABLE CHEST 1 VIEW

[Series 1: chest · 0.14mm/px · 2 of 2 slices shown]
[im 1/2]
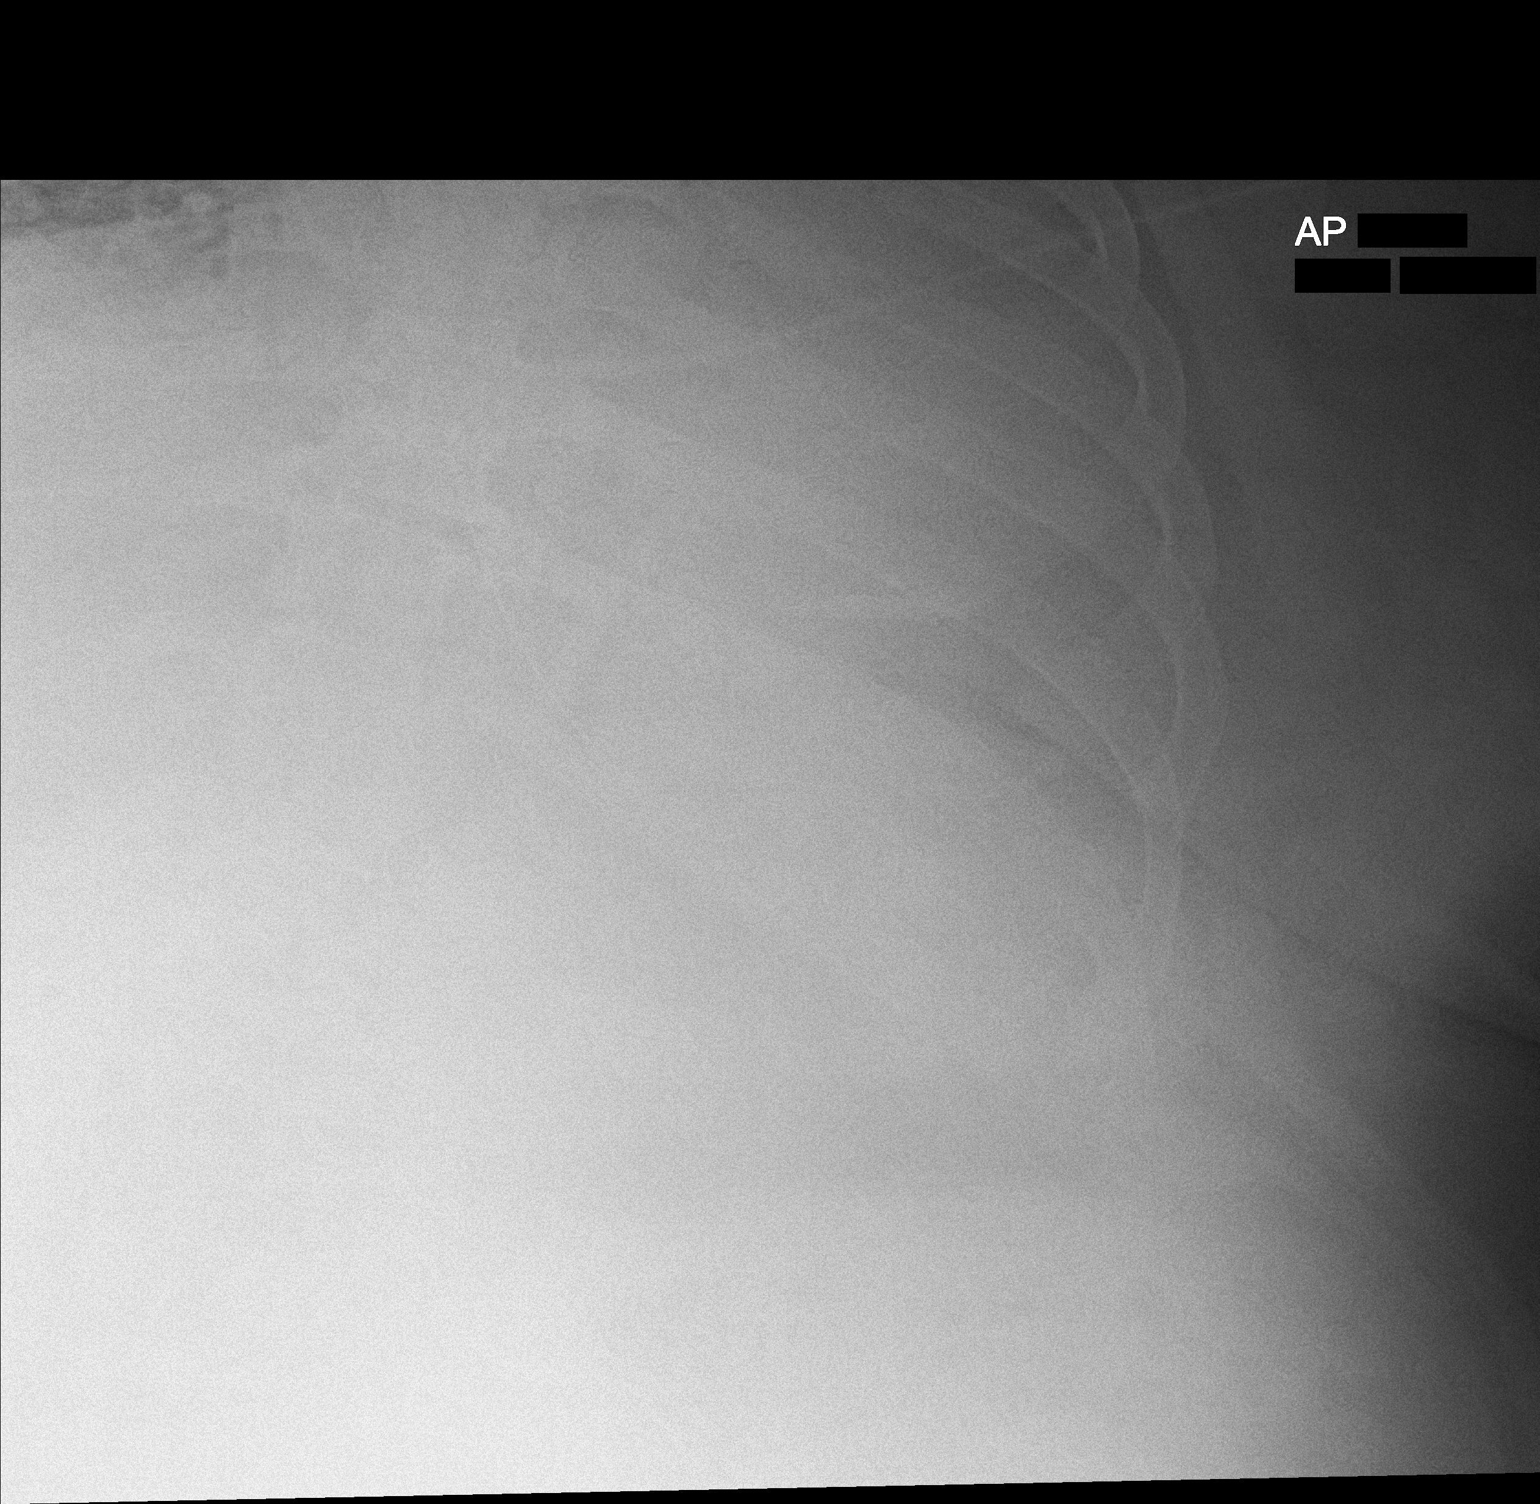
[im 2/2]
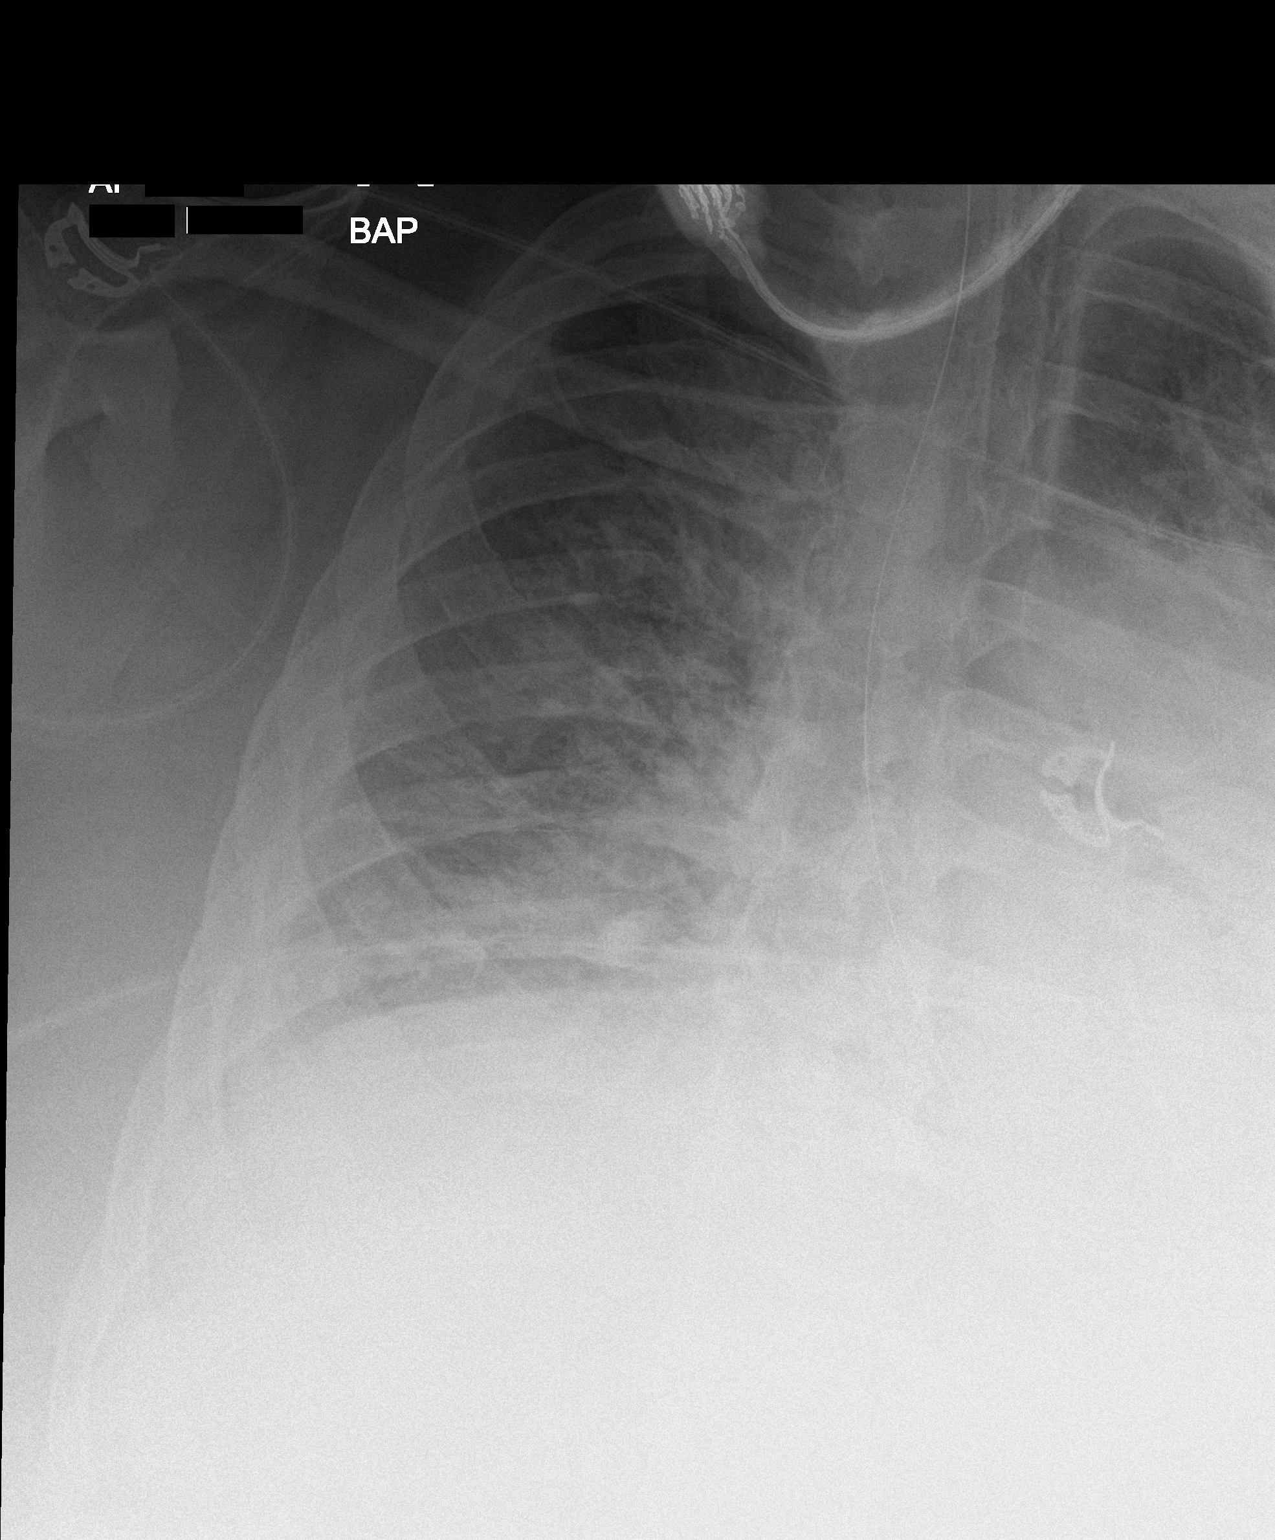

[2 of 2 positions shown; findings below may reference images not displayed]

FINDINGS: Cardiac shadow remains enlarged. The examination is significantly
limited due to patient positioning. Endotracheal tube and gastric
catheter are again seen and stable. Slight improved aeration is
noted bilaterally although persistent central vascular congestion is
seen.
IMPRESSION: Slight improved aeration when compared with the prior exam.

## 2021-09-29 IMAGING — CT CT HEAD W/O CM
5 of 6 series · 17 of 47 positions shown, 18 images · non-contrast
Comparison: Head CT 12/03/2019

CLINICAL DATA: Left eye proptosis after coughing episode

EXAM:
CT HEAD WITHOUT CONTRAST
TECHNIQUE: Contiguous axial images were obtained from the base of the skull
through the vertex without intravenous contrast.

[Series 2: head wo · axial · 0.38mm/px · z∈[-67,-22]mm · 2 of 28 slices shown]
[im 10/28  brain]
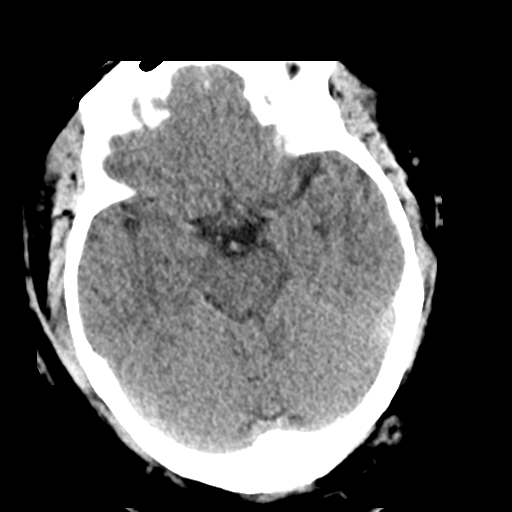
[im 19/28  brain]
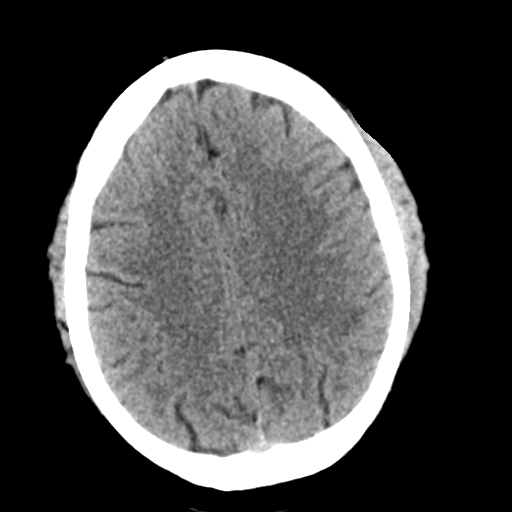

[Series 4: head wo recon · axial · 0.41mm/px · z∈[-67,+6]mm · 3 of 32 slices shown, 4 images]
[im 8/32  brain]
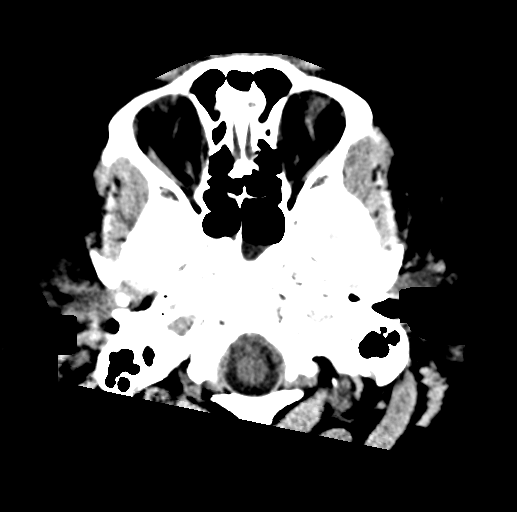
[im 8/32  bone]
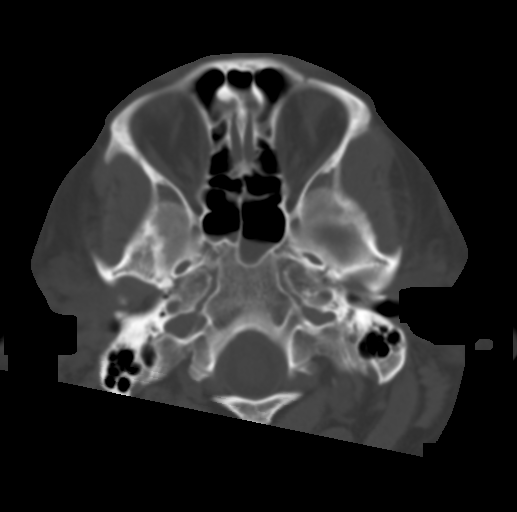
[im 16/32  brain]
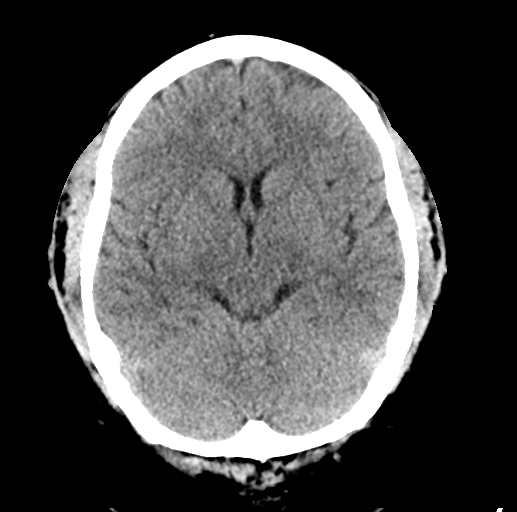
[im 24/32  brain]
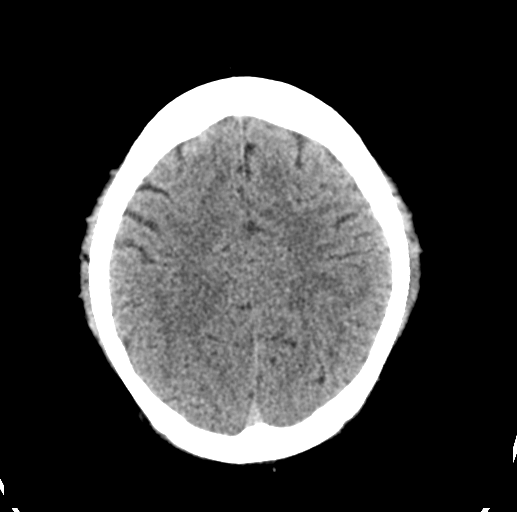

[Series 5: head bone recon · axial · 0.39mm/px · z∈[-100,-13]mm · 6 of 76 slices shown]
[im 8/76  bone]
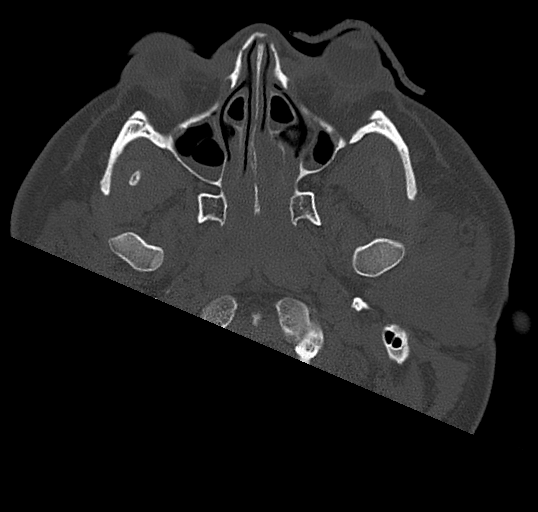
[im 16/76  bone]
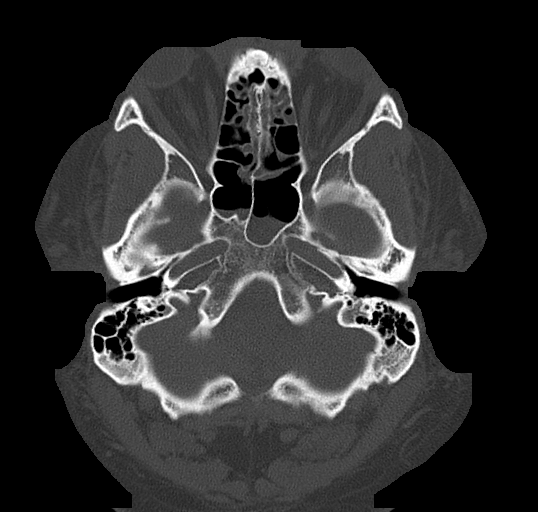
[im 23/76  bone]
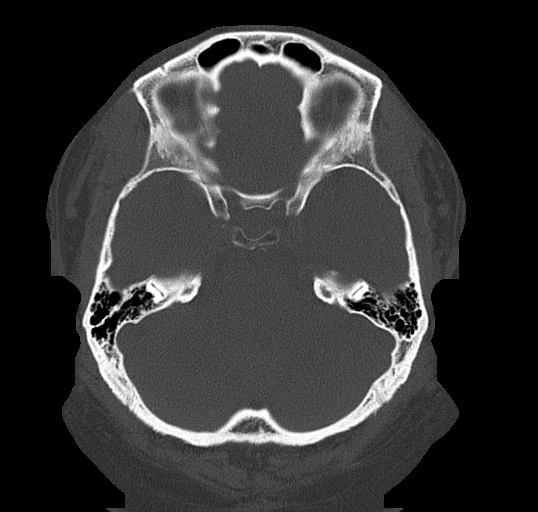
[im 31/76  bone]
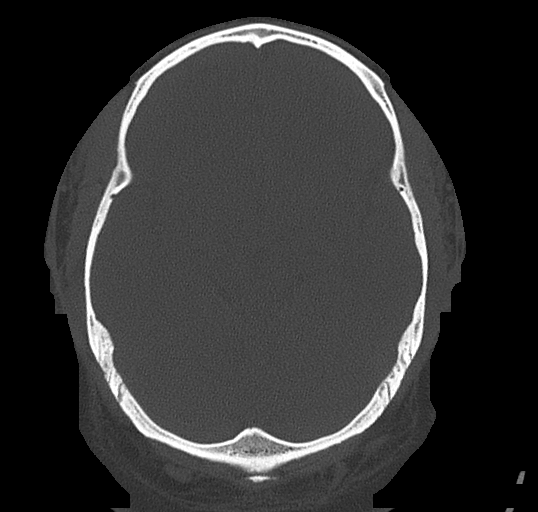
[im 46/76  bone]
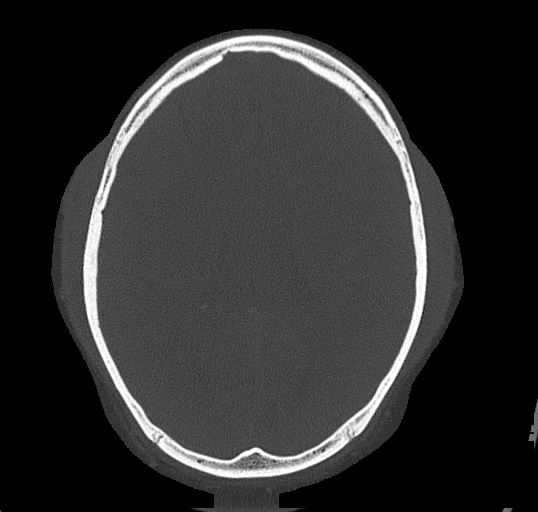
[im 53/76  bone]
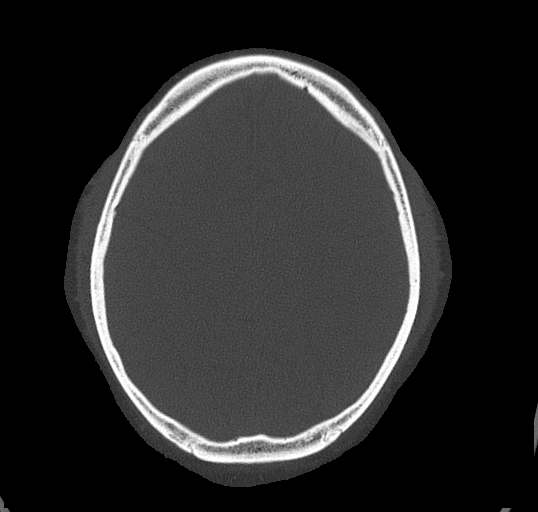

[Series 6: coronal soft tissue · coronal · 0.34mm/px · 3 of 63 slices shown]
[im 21/63  brain]
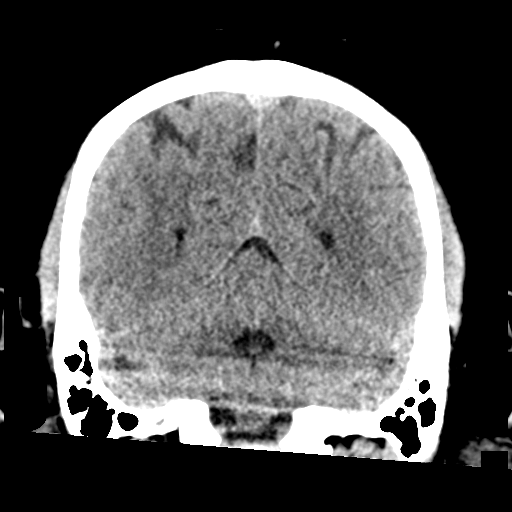
[im 28/63  brain]
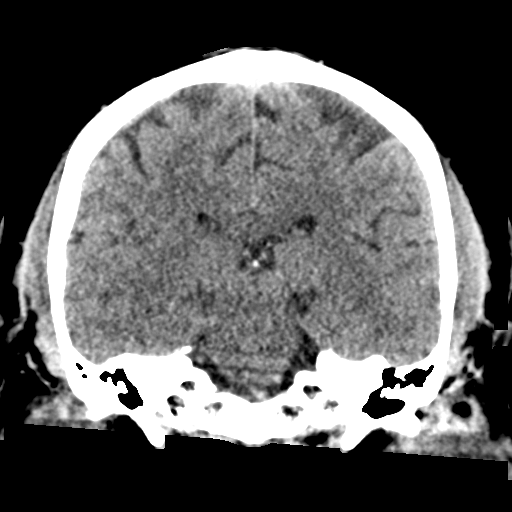
[im 35/63  brain]
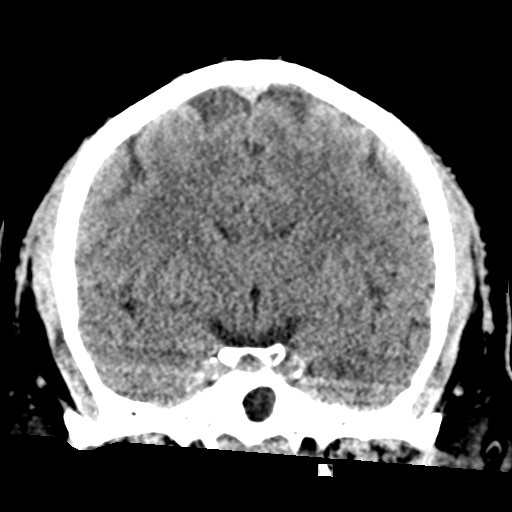

[Series 7: sagittal soft tissue · sagittal · 0.32mm/px · 3 of 55 slices shown]
[im 19/55  brain]
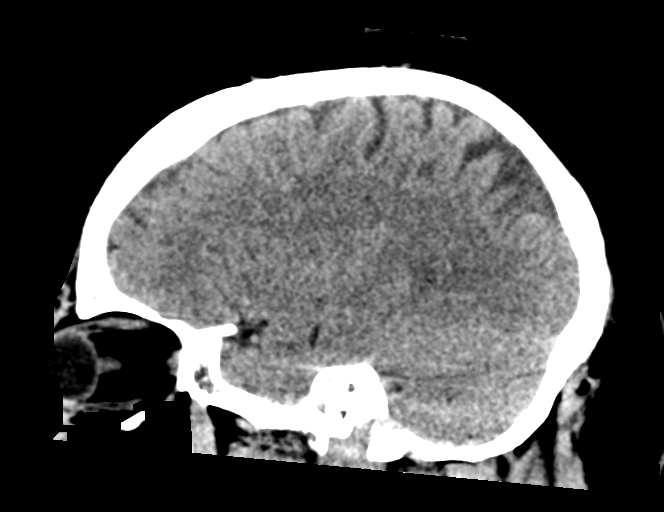
[im 28/55  brain]
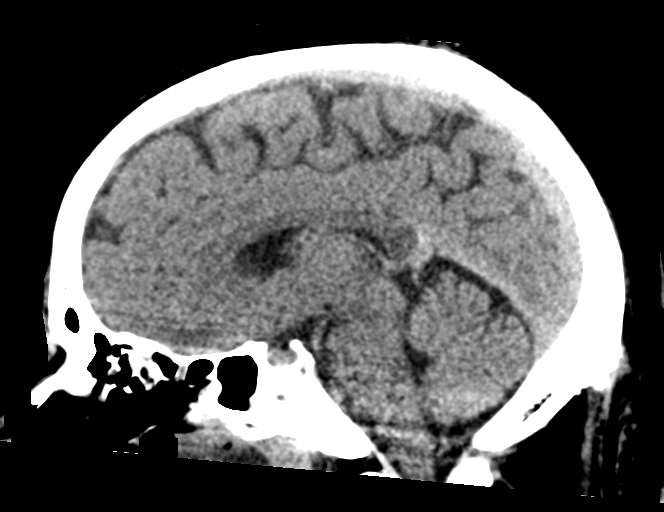
[im 37/55  brain]
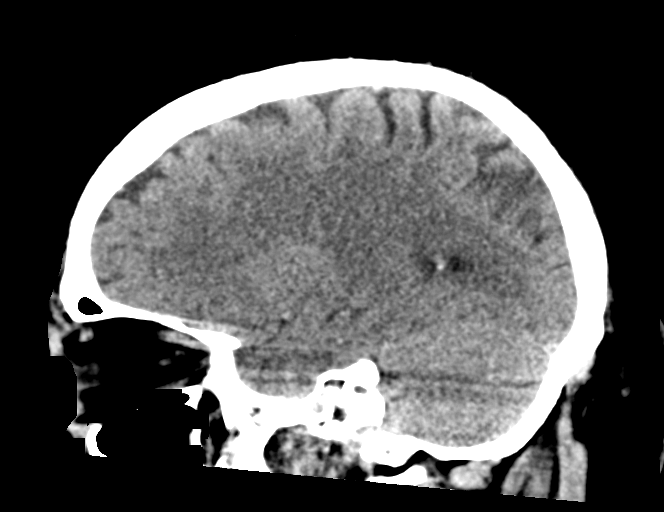

[17 of 47 positions shown; findings below may reference images not displayed]

FINDINGS: Brain: There is no mass, hemorrhage or extra-axial collection. The
size and configuration of the ventricles and extra-axial CSF spaces
are normal. The brain parenchyma is normal, without acute or chronic
infarction.

Vascular: No abnormal hyperdensity of the major intracranial
arteries or dural venous sinuses. No intracranial atherosclerosis.

Skull: The visualized skull base, calvarium and extracranial soft
tissues are normal.

Sinuses/Orbits: Fluid in the paranasal sinuses secondary to
intubation. The orbits are normal.
IMPRESSION: Normal head CT.

## 2023-08-14 ENCOUNTER — Institutional Professional Consult (permissible substitution): Payer: BLUE CROSS/BLUE SHIELD | Admitting: Student in an Organized Health Care Education/Training Program

## 2023-09-05 ENCOUNTER — Ambulatory Visit: Payer: Medicare Other | Admitting: Pulmonary Disease

## 2023-09-05 ENCOUNTER — Encounter: Payer: Self-pay | Admitting: Pulmonary Disease

## 2023-09-05 VITALS — BP 118/80 | HR 114

## 2023-09-05 DIAGNOSIS — G4733 Obstructive sleep apnea (adult) (pediatric): Secondary | ICD-10-CM

## 2023-09-05 DIAGNOSIS — J9611 Chronic respiratory failure with hypoxia: Secondary | ICD-10-CM | POA: Diagnosis not present

## 2023-09-05 DIAGNOSIS — R0602 Shortness of breath: Secondary | ICD-10-CM

## 2023-09-05 DIAGNOSIS — E662 Morbid (severe) obesity with alveolar hypoventilation: Secondary | ICD-10-CM

## 2023-09-05 NOTE — Patient Instructions (Signed)
VISIT SUMMARY:  Clifford Nichols, a 43 year old male, visited today due to ongoing hypoxia since his severe COVID-19 infection in 2021. He uses oxygen at home and a CPAP machine for severe sleep apnea. He has been on Trulicity for weight loss since January 2025. He reports stable oxygen levels but requires further evaluation to ensure his current treatment is adequate.  YOUR PLAN:  -HYPOXIA: Hypoxia means low oxygen levels in the body. We will order a chest x-ray to check for fluid in your lungs, a cardiac evaluation, and a lab study to assess your breathing and the adequacy of your sleep apnea equipment.  -OBSTRUCTIVE SLEEP APNEA: Obstructive sleep apnea is a condition where your breathing stops and starts during sleep. We will schedule a lab study to assess your breathing and equipment, and discuss the potential need for a BiPAP machine with oxygen bleed-in to ensure you maintain good oxygen levels.  -OBESITY: Obesity is a condition of having excess body weight. You have been using Trulicity for weight loss, and we recommend considering Zepbound, which may be more effective for your sleep apnea. Continue your current Trulicity regimen until further evaluation.  -GENERAL HEALTH MAINTENANCE: You use a walker and have been advised to get a portable oxygen concentrator for public use to help maintain your mobility and oxygen levels when outside your home.  INSTRUCTIONS:  Please follow up in 3-4 weeks with Dr. Jayme Cloud or your sleep provider. We will also schedule the necessary tests and evaluations as discussed.

## 2023-09-05 NOTE — Progress Notes (Signed)
 Subjective:    Patient ID: Clifford Nichols, male    DOB: 03/24/1981, 44 y.o.   MRN: [id number]  Patient Care Team: Oswaldo Conroy, MD as PCP - General (Family Medicine) Salena Saner, MD as Consulting Physician (Pulmonary Disease)  Chief Complaint  Patient presents with   Consult    Oxygen. On O2 as needed.     BACKGROUND: The patient is a 43 year old morbidly obese gentleman with morbid obesity who presents for evaluation of hypoxemia.  Previously evaluated by Dr. Coralyn Helling for severe obstructive sleep apnea.  Past history significant for ARDS from COVID-19 pneumonia in 2021.  States he has been on oxygen since then.  Required tracheostomy during his COVID-19 hospitalization.  HPI Discussed the use of AI scribe software for clinical note transcription with the patient, who gave verbal consent to proceed.  History of Present Illness   Clifford Nichols is a 43 year old male with severe sleep apnea who presents with hypoxia.  Hypoxia has been ongoing since his discharge in 2021 following a severe COVID-19 infection. He uses oxygen at home as needed and is currently on 4 liters to maintain an oxygen saturation of 93%. He uses a walker for mobility, and a portable oxygen device was suggested for use while ambulating.  He has a history of severe sleep apnea and uses a CPAP machine. He is unsure of the exact doctor who initiated the CPAP therapy but recalls it was a doctor in [place] who assisted in his recovery.  Eventually he stated that it was Dr. Wynn Banker.  He does not currently blend oxygen with his CPAP.  In the past it appears that Dr. Craige Cotta had recommended patient had a titration study due to the severity of apnea.  This was never done.  The patient really has not followed up with a pulmonary or sleep specialist since 2021.  He has been on Trulicity since January 2025, having started it last year but only consistently using it since January. He recently increased the  dose.  No leg swelling, but he reports some pain in his legs.  He has not had any fevers, chills or sweats.  No cough or sputum production.  Sleeps in a recliner usually.     Review of Systems A 10 point review of systems was performed and it is as noted above otherwise negative.   Past Medical History:  Diagnosis Date   Abscess of upper back excluding scapular region    Acute respiratory distress syndrome (ARDS) due to COVID-19 virus (HCC) 11/2019   Diabetes mellitus without complication (HCC)    GERD (gastroesophageal reflux disease)    OCC-NO MEDS   Hypertension    Sebaceous cyst     Past Surgical History:  Procedure Laterality Date   IRRIGATION AND DEBRIDEMENT SEBACEOUS CYST Right 07/11/2018   Procedure: EXCISION  SEBACEOUS CYST POSTERIOR SHOULDER AND MID-BACK;  Surgeon: Ancil Linsey, MD;  Location: [facility] ORS;  Service: General;  Laterality: Right;    Patient Active Problem List   Diagnosis Date Noted   History of COVID-19 05/14/2020   Foot pain (Bilateral) 04/07/2020   Chronic pain syndrome 04/05/2020   Pharmacologic therapy 04/05/2020   Disorder of skeletal system 04/05/2020   Problems influencing health status 04/05/2020   Long term prescription opiate use 04/05/2020   Uncomplicated opioid dependence (HCC) 04/05/2020   Opioid use (90 MME) 04/05/2020   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/2020   Neurogenic pain 04/05/2020   Chronic neuropathic  pain 04/05/2020   Morbid obesity with BMI of 50.0-59.9, adult (HCC) 01/27/2020   OSA (obstructive sleep apnea) 01/27/2020   Critical illness polyneuropathy (HCC) 01/13/2020   Debility 01/13/2020   Status post tracheostomy (HCC)    Pressure injury of skin 12/27/2019   Acute on chronic respiratory failure with hypoxia and hypercapnia (HCC)    Acute respiratory failure (HCC)    ARDS (adult respiratory distress syndrome) (HCC) 12/04/2019   COVID-19 12/03/2019   Acute respiratory failure due to COVID-19 (HCC)  12/02/2019   Hyperglycemia 12/02/2019   Diabetes (HCC) 12/02/2019   HTN (hypertension) 04/27/2018    Family History  Problem Relation Age of Onset   Hypertension Father     Social History   Tobacco Use   Smoking status: Former    Current packs/day: 0.00    Types: Cigarettes    Quit date: 12/31/2017    Years since quitting: 5.6   Smokeless tobacco: Never   Tobacco comments:    Smoked 7 cigarettes a day at the heaviest.  Substance Use Topics   Alcohol use: Not Currently    No Known Allergies  Current Meds  Medication Sig   acetaminophen (TYLENOL) 325 MG tablet Take 1-2 tablets (325-650 mg total) by mouth every 4 (four) hours as needed for mild pain.   amLODipine (NORVASC) 10 MG tablet Take 10 mg by mouth daily.   atorvastatin (LIPITOR) 20 MG tablet Take 20 mg by mouth daily.   CYMBALTA 30 MG capsule TAKE 1 CAPSULE DAILY FOR 1 WEEK, THEN INCREASE TO 1 CAPSULE TWICE A DAY FOR MOOD.   DULoxetine (CYMBALTA) 60 MG capsule Take 1 capsule (60 mg total) by mouth daily.   escitalopram (LEXAPRO) 5 MG tablet Take 5 mg by mouth daily.   gabapentin (NEURONTIN) 300 MG capsule Take 2 capsules (600 mg total) by mouth 3 (three) times daily.   gabapentin (NEURONTIN) 800 MG tablet Take 800 mg by mouth at bedtime.    lisinopril (ZESTRIL) 20 MG tablet Take 20 mg by mouth daily.   metFORMIN (GLUCOPHAGE) 1000 MG tablet Take 1,000 mg by mouth 2 (two) times daily.   TRULICITY 4.5 MG/0.5ML SOAJ 1 injection weekly    Immunization History  Administered Date(s) Administered   Influenza-Unspecified 07/26/2023        Objective:   BP 118/80 (BP Location: Right Wrist, Cuff Size: Large)   Pulse (!) 114   SpO2 93%  weight is last recorded at 386 pounds.  SpO2: 93 % O2 Device: Nasal cannula O2 Flow Rate (L/min): 4 L/min O2 Type: Continuous O2  GENERAL: Morbidly obese gentleman, presents in transport chair.  No respiratory distress.  No conversational dyspnea. HEAD: Normocephalic, atraumatic.   EYES: Pupils equal, round, reactive to light.  No scleral icterus.  MOUTH: Patient masked. NECK: Supple. No thyromegaly. Trachea midline.  Cannot assess JVD.  Very thick neck.  No adenopathy. PULMONARY: Good air entry bilaterally.  No adventitious sounds. CARDIOVASCULAR: S1 and S2.  Tachycardic, regular rate, no rubs murmurs gallops heard.  ABDOMEN: Extremely obese, soft.  No other assessment possible. MUSCULOSKELETAL: No joint deformity, no clubbing, no edema.  NEUROLOGIC: No overt neurodeficit. SKIN: Intact,warm,dry. PSYCH: Mood and behavior normal.   Assessment & Plan:     ICD-10-CM   1. OSA (obstructive sleep apnea) SEVERE  G47.33 Polysomnography 4 or more parameters   AHI 83, SpO2 low of 56%    2. Chronic respiratory failure with hypoxia (HCC)  J96.11 DG Chest 2 View    3. Extreme  obesity with alveolar hypoventilation (HCC)  E66.2 Pulmonary Function Test [facility] Only    4. Shortness of breath  R06.02 ECHOCARDIOGRAM COMPLETE    Pulmonary Function Test [facility] Only    DG Chest 2 View      Orders Placed This Encounter  Procedures   DG Chest 2 View    Standing Status:   Future    Expiration Date:   09/04/2024    Reason for Exam (SYMPTOM  OR DIAGNOSIS REQUIRED):   Resp failure, Hypoxia    Preferred imaging location?:   [facility]   Pulmonary Function Test [facility] Only    Standing Status:   Future    Expiration Date:   09/04/2024    Full PFT: includes the following: basic spirometry, spirometry pre & post bronchodilator, diffusion capacity (DLCO), lung volumes:   Full PFT    ABG:   Yes    This test can only be performed at:   [facility]   ECHOCARDIOGRAM COMPLETE    Standing Status:   Future    Expected Date:   09/12/2023    Expiration Date:   09/04/2024    Scheduling Instructions:     Suspect cardiomyopathy.    Where should this test be performed:   [facility]    Please indicate who you request to read the nuc med / echo results.:   [facility]     Perflutren DEFINITY (image enhancing agent) should be administered unless hypersensitivity or allergy exist:   Administer Perflutren    Reason for exam-Echo:   Dyspnea  R06.00   Polysomnography 4 or more parameters    Standing Status:   Future    Expected Date:   09/19/2023    Expiration Date:   09/04/2024    Scheduling Instructions:     Please schedule ASAP.  Patient with very severe sleep apnea on CPAP currently not following symptoms.    Where should this test be performed::   [place]   Assessment and Plan    Hypoxia Clifford Nichols presents with hypoxia, reporting stable condition despite oxygen saturation at 93% on 4 liters of oxygen. He has been on oxygen since a severe COVID-19 infection in 2021. Current oxygen needs may not be adequately met. - Order chest x-ray for evaluation of hypoxia - Order echocardiogram - Schedule in lab sleep lab study to assess breathing and equipment adequacy for sleep apnea  Obstructive Sleep Apnea Clifford Nichols has severe obstructive sleep apnea, currently managed with CPAP. He may require BiPAP with oxygen bleed-in versus noninvasive ventilation. Emphasized the importance of maintaining good oxygen saturations. - Schedule lab study to assess breathing and equipment adequacy - Discuss potential need for BiPAP with oxygen bleed-in  Extreme Obesity with Alveolar Hypoventilation Clifford Nichols is on Trulicity for weight loss since January. Recommended considering Zepbound, potentially more effective in the setting of concomitant obstructive sleep apnea. Weight loss is crucial for improving lung function and overall health. - Consider Zepbound for weight loss - Continue current Trulicity regimen until further evaluation  General Health Maintenance Clifford Nichols uses a walker and has been advised to get a portable oxygen concentrator for use while ambulating. - Current O2 requirements are too high to be met with portable oxygen concentrator.  Follow-up - Follow up in  3-4 weeks with Dr. Jayme Cloud or sleep provider.      Advised if symptoms do not improve or worsen, to please contact office for sooner follow up or seek emergency care.    I spent 48 minutes  of dedicated to the care of this patient on the date of this encounter to include pre-visit review of records, face-to-face time with the patient discussing conditions above, post visit ordering of testing, clinical documentation with the electronic health record, making appropriate referrals as documented, and communicating necessary findings to members of the patients care team.   C. Danice Goltz, MD Advanced Bronchoscopy PCCM [facility]    *This note was dictated using voice recognition software/Dragon.  Despite best efforts to proofread, errors can occur which can change the meaning. Any transcriptional errors that result from this process are unintentional and may not be fully corrected at the time of dictation.

## 2023-09-20 ENCOUNTER — Institutional Professional Consult (permissible substitution): Payer: BLUE CROSS/BLUE SHIELD | Admitting: Pulmonary Disease

## 2023-09-29 ENCOUNTER — Ambulatory Visit: Attending: Otolaryngology

## 2023-09-29 DIAGNOSIS — G4733 Obstructive sleep apnea (adult) (pediatric): Secondary | ICD-10-CM | POA: Insufficient documentation

## 2023-10-10 ENCOUNTER — Ambulatory Visit: Payer: BLUE CROSS/BLUE SHIELD

## 2023-10-19 ENCOUNTER — Ambulatory Visit: Admission: RE | Admit: 2023-10-19 | Payer: BLUE CROSS/BLUE SHIELD | Source: Ambulatory Visit

## 2023-10-19 ENCOUNTER — Ambulatory Visit: Admitting: Pulmonary Disease

## 2023-10-19 ENCOUNTER — Ambulatory Visit: Payer: BLUE CROSS/BLUE SHIELD | Attending: Pulmonary Disease

## 2023-10-19 ENCOUNTER — Ambulatory Visit

## 2023-10-19 ENCOUNTER — Encounter: Payer: Self-pay | Admitting: Pulmonary Disease

## 2023-10-19 ENCOUNTER — Ambulatory Visit: Payer: BLUE CROSS/BLUE SHIELD | Admitting: Pulmonary Disease

## 2023-10-19 VITALS — BP 126/96 | HR 120 | Temp 97.7°F | Ht 67.0 in | Wt >= 6400 oz

## 2023-10-19 DIAGNOSIS — Z6841 Body Mass Index (BMI) 40.0 and over, adult: Secondary | ICD-10-CM | POA: Diagnosis not present

## 2023-10-19 DIAGNOSIS — Z9981 Dependence on supplemental oxygen: Secondary | ICD-10-CM

## 2023-10-19 DIAGNOSIS — E662 Morbid (severe) obesity with alveolar hypoventilation: Secondary | ICD-10-CM

## 2023-10-19 DIAGNOSIS — E669 Obesity, unspecified: Secondary | ICD-10-CM

## 2023-10-19 DIAGNOSIS — J9611 Chronic respiratory failure with hypoxia: Secondary | ICD-10-CM

## 2023-10-19 DIAGNOSIS — G4730 Sleep apnea, unspecified: Secondary | ICD-10-CM

## 2023-10-19 DIAGNOSIS — R0602 Shortness of breath: Secondary | ICD-10-CM

## 2023-10-19 DIAGNOSIS — G4733 Obstructive sleep apnea (adult) (pediatric): Secondary | ICD-10-CM | POA: Diagnosis not present

## 2023-10-19 DIAGNOSIS — Z87891 Personal history of nicotine dependence: Secondary | ICD-10-CM

## 2023-10-19 LAB — BLOOD GAS, ARTERIAL
Acid-Base Excess: 7.9 mmol/L — ABNORMAL HIGH (ref 0.0–2.0)
Bicarbonate: 35.9 mmol/L — ABNORMAL HIGH (ref 20.0–28.0)
O2 Content: 3 L/min
O2 Saturation: 92.3 %
Patient temperature: 37
pCO2 arterial: 65 mmHg — ABNORMAL HIGH (ref 32–48)
pH, Arterial: 7.35 (ref 7.35–7.45)
pO2, Arterial: 64 mmHg — ABNORMAL LOW (ref 83–108)

## 2023-10-19 NOTE — Patient Instructions (Signed)
 VISIT SUMMARY:  Clifford Nichols, a 43 year old male with chronic hypoxic respiratory failure and severe obstructive sleep apnea, came in for a follow-up visit. He has been experiencing significant issues with his breathing and sleep, and he missed a crucial breathing test and echocardiogram. He is also interested in switching his medication from Trulicity to Zepbound, which he believes may help with his sleep apnea.  YOUR PLAN:  -CHRONIC HYPOXIC RESPIRATORY FAILURE: Chronic hypoxic respiratory failure means that your body is not getting enough oxygen over a long period. You need continuous oxygen therapy at four liters per minute. We will send another order to the oxygen supply company for portable oxygen to ensure you have what you need at home.  -SEVERE OBSTRUCTIVE SLEEP APNEA: Severe obstructive sleep apnea is a condition where your breathing repeatedly stops and starts during sleep. You need to use a BiPAP machine with specific settings (26/16 cm H2O, backup rate 14 breaths/min, 2 liters O2 bled in) to help you breathe better at night. This will improve your heart and lung function and may help with weight loss.  -OBESITY: Obesity can make your breathing problems worse. Losing weight can help improve your oxygen levels and reduce the severity of your sleep apnea. We will discuss with your primary care provider about switching from Trulicity to Zepbound to help with weight loss and manage your sleep apnea.  INSTRUCTIONS:  Please schedule a follow-up appointment in 3-4 weeks to monitor your response to the treatments. If you experience any new difficulties before your follow-up, contact our office immediately.

## 2023-10-19 NOTE — Progress Notes (Signed)
 Subjective:    Patient ID: Clifford Nichols, male    DOB: 12/06/1980, 43 y.o.   MRN: [id number]  Patient Care Team: Oswaldo Conroy, MD as PCP - General (Family Medicine) Salena Saner, MD as Consulting Physician (Pulmonary Disease)  Chief Complaint  Patient presents with   Follow-up    BACKGROUND/INTERVAL:  HPI Discussed the use of AI scribe software for clinical note transcription with the patient, who gave verbal consent to proceed.  History of Present Illness   Clifford Nichols is a 43 year old male with chronic hypoxic/hypercarbic respiratory failure and severe obstructive sleep apnea who presents for follow-up.  He has severe obstructive sleep apnea, with a sleep study showing 211 apneic events per hour. Various BiPAP machines were trialed during the study to find a comfortable fit, and a suitable option was identified.  He is following up for chronic hypoxic respiratory failure. He did not attend a scheduled pulmonary function test and echocardiogram at the cardiopulmonary department because he was unaware of the appointment. He currently lacks portable oxygen at home and is unsure of the company providing his oxygen, though he thinks recalls the name '[name]'.  He has a concentrator that was given to him after a discharge from the hospital in 2021, he states he has no portable oxygen source.  He is interested in switching from Trulicity to another medication, possibly Zepbound/Munjaro, which may also help with his sleep apnea. He discussed this potential change with his primary care physician, Dr. Tonia Ghent.      Review of Systems A 10 point review of systems was performed and it is as noted above otherwise negative.   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/2020   Neurogenic pain 04/05/2020   Chronic neuropathic pain 04/05/2020   Morbid obesity with BMI of 50.0-59.9, adult (HCC) 01/27/2020   OSA (obstructive sleep apnea) 01/27/2020   Critical illness polyneuropathy (HCC) 01/13/2020   Debility 01/13/2020   Status post tracheostomy (HCC)    Pressure injury of skin 12/27/2019   Acute on chronic respiratory failure with hypoxia and hypercapnia (HCC)    Acute respiratory failure (HCC)    ARDS (adult respiratory distress syndrome) (HCC) 12/04/2019   COVID-19 12/03/2019   Acute respiratory failure due to COVID-19 (HCC) 12/02/2019   Hyperglycemia 12/02/2019   Diabetes (HCC) 12/02/2019   HTN (hypertension) 04/27/2018    Social History   Tobacco Use   Smoking status: Former    Current packs/day: 0.00    Types: Cigarettes    Quit date: 12/31/2017    Years since quitting: 5.8   Smokeless tobacco: Never   Tobacco comments:    Smoked 7 cigarettes a day at the heaviest.  Substance Use Topics   Alcohol use: Not Currently    No Known Allergies  Current Meds  Medication Sig   acetaminophen (TYLENOL) 325 MG tablet Take 1-2 tablets (325-650 mg total) by mouth every 4 (four) hours as needed for mild pain.   amLODipine (NORVASC) 10 MG tablet Take 10 mg by mouth daily.   atorvastatin (LIPITOR) 20 MG tablet  Take 20 mg by mouth daily.   CYMBALTA 30 MG capsule TAKE 1 CAPSULE DAILY FOR 1 WEEK, THEN INCREASE TO 1 CAPSULE TWICE A DAY FOR MOOD.   DULoxetine (CYMBALTA) 60 MG capsule Take 1 capsule (60 mg total) by mouth daily.   escitalopram (LEXAPRO) 5 MG tablet Take 5 mg by mouth daily.   gabapentin (NEURONTIN) 300 MG capsule Take 2 capsules (600 mg total) by mouth 3 (three) times daily.   gabapentin (NEURONTIN) 800 MG tablet Take 800 mg  by mouth at bedtime.    lisinopril (ZESTRIL) 20 MG tablet Take 20 mg by mouth daily.   metFORMIN (GLUCOPHAGE) 1000 MG tablet Take 1,000 mg by mouth 2 (two) times daily.   TRULICITY 4.5 MG/0.5ML SOAJ 1 injection weekly    Immunization History  Administered Date(s) Administered   Influenza-Unspecified 07/26/2023        Objective:     BP (!) 126/96 (BP Location: Left Wrist, Patient Position: Sitting, Cuff Size: Normal)   Pulse (!) 120   Temp 97.7 F (36.5 C) (Temporal)   Ht 5\' 7"  (1.702 m)   Wt (!) 415 lb (188.2 kg)   SpO2 (!) 80%   BMI 65.00 kg/m   SpO2: (!) 80 % on RA  SpO2: 95 % O2 Device: Nasal cannula O2 Flow Rate (L/min): 4 L/min O2 Type: Continuous O2   GENERAL: Morbidly obese gentleman, presents in transport chair.  No respiratory distress.  No conversational dyspnea. HEAD: Normocephalic, atraumatic.  EYES: Pupils equal, round, reactive to light.  No scleral icterus.  MOUTH: Patient masked. NECK: Supple. No thyromegaly. Trachea midline.  Cannot assess JVD.  Very thick neck.  No adenopathy. PULMONARY: Good air entry bilaterally.  No adventitious sounds. CARDIOVASCULAR: S1 and S2.  Tachycardic, regular rate, no rubs murmurs gallops heard.  ABDOMEN: Extremely obese, soft.  No other assessment possible. MUSCULOSKELETAL: No joint deformity, no clubbing, no edema.  NEUROLOGIC: No overt neurodeficit. SKIN: Intact,warm,dry. PSYCH: Mood and behavior normal.  ABG    Component Value Date/Time   PHART 7.35 10/19/2023 1600   PCO2ART 65 (H) 10/19/2023 1600   PO2ART 64 (L) 10/19/2023 1600   HCO3 35.9 (H) 10/19/2023 1600   TCO2 33 (H) 12/19/2019 0311   O2SAT 92.3 10/19/2023 1600     Assessment & Plan:     ICD-10-CM   1. Severe sleep apnea  G47.30 AMB REFERRAL FOR DME    Pulmonary Function Test [facility] Only    AMB REFERRAL FOR DME    2. Chronic respiratory failure with hypoxia (HCC)  J96.11 Pulmonary Function Test [facility] Only    AMB REFERRAL FOR DME    Pulmonary  Function Test [facility] Only    3. Extreme obesity with alveolar hypoventilation (HCC)  E66.2 Pulmonary Function Test [facility] Only    AMB REFERRAL FOR DME    Pulmonary Function Test [facility] Only    4. Shortness of breath  R06.02 Pulmonary Function Test [facility] Only      Orders Placed This Encounter  Procedures   AMB REFERRAL FOR DME    Referral Priority:   Urgent    Referral Type:   Durable Medical Equipment Purchase    Number of Visits Requested:   1   AMB REFERRAL FOR DME    Referral Priority:   Urgent    Referral Type:   Durable Medical Equipment Purchase    Number of Visits Requested:   1   Pulmonary Function Test [facility] Only    Standing Status:  Future    Number of Occurrences:   1    Expiration Date:   10/18/2024    ABG:   Yes   Pulmonary Function Test [facility] Only    Standing Status:   Future    Expected Date:   11/02/2023    Expiration Date:   10/18/2024    Full PFT: includes the following: basic spirometry, spirometry pre & post bronchodilator, diffusion capacity (DLCO), lung volumes:   Full PFT    This test can only be performed at:   [facility]   Discussion:    Chronic hypoxic respiratory failure Chronic hypoxic respiratory failure requiring continuous oxygen therapy. He missed the scheduled breathing test and echocardiogram, which are crucial for further assessment. He lacks portable oxygen at home, necessary for management. - Send another order to the oxygen supply company for portable oxygen. - Ensure continuous oxygen therapy at four liters per minute.  Severe obstructive sleep apnea Severe obstructive sleep apnea with 211 apneic events per hour during the sleep study. Requires BiPAP therapy. He has tried different BiPAP masks for comfort, essential for adherence. BiPAP therapy is expected to improve respiratory function and aid in weight loss, potentially reducing supplemental oxygen need. BiPAP settings: 26/16 cm H2O, backup rate 14 breaths/min, 2 liters O2 bled in. -  Order BiPAP equipment with specified settings. - Educate on BiPAP use during sleep to improve cardiac and pulmonary function.  Obesity Obesity contributes to respiratory issues, including supplemental oxygen need. Weight loss is recommended to improve oxygenation and reduce sleep apnea severity. Zepbound is considered as an alternative to Trulicity for weight management and sleep apnea. - Discuss with primary care provider, Dr. Tonia Ghent, about switching from Trulicity to [facility] for weight loss and sleep apnea management.  Follow-up Follow-up is necessary to monitor response to interventions and adjust treatment as needed. - Schedule follow-up in 4 to 6 weeks. - Instruct to contact the office if new difficulties arise before follow-up.      Advised if symptoms do not improve or worsen, to please contact office for sooner follow up or seek emergency care.    I spent 40 minutes of dedicated to the care of this patient on the date of this encounter to include pre-visit review of records, face-to-face time with the patient discussing conditions above, post visit ordering of testing, clinical documentation with the electronic health record, making appropriate referrals as documented, and communicating necessary findings to members of the patients care team.     C. Danice Goltz, MD Advanced Bronchoscopy PCCM [facility]    *This note was generated using voice recognition software/Dragon and/or AI transcription program.  Despite best efforts to proofread, errors can occur which can change the meaning. Any transcriptional errors that result from this process are unintentional and may not be fully corrected at the time of dictation.

## 2023-10-25 ENCOUNTER — Telehealth: Payer: Self-pay | Admitting: Cardiology

## 2023-10-25 NOTE — Telephone Encounter (Incomplete)
 Called to confirm/remind patient of their appointment [facility] on 10/26/23***.   Appointment:   [x] Confirmed  [] Left mess   [] No answer/No voice mail  [] Phone not in service  Patient reminded to bring all medications and/or complete list.  Confirmed patient has transportation. Gave directions, instructed to utilize valet parking.

## 2023-10-26 ENCOUNTER — Other Ambulatory Visit (HOSPITAL_BASED_OUTPATIENT_CLINIC_OR_DEPARTMENT_OTHER): Payer: Self-pay

## 2023-10-26 ENCOUNTER — Ambulatory Visit

## 2023-10-26 ENCOUNTER — Encounter

## 2023-10-26 ENCOUNTER — Ambulatory Visit: Admitting: Cardiology

## 2023-10-26 DIAGNOSIS — J9611 Chronic respiratory failure with hypoxia: Secondary | ICD-10-CM

## 2023-10-30 ENCOUNTER — Inpatient Hospital Stay
Admission: EM | Admit: 2023-10-30 | Discharge: 2023-11-15 | DRG: 870 | Disposition: A | Discharge status: Home Health | Payer: Self-pay | Attending: Internal Medicine | Admitting: Internal Medicine

## 2023-10-30 ENCOUNTER — Emergency Department: Payer: Self-pay

## 2023-10-30 DIAGNOSIS — L03313 Cellulitis of chest wall: Secondary | ICD-10-CM

## 2023-10-30 DIAGNOSIS — N179 Acute kidney failure, unspecified: Secondary | ICD-10-CM | POA: Diagnosis present

## 2023-10-30 DIAGNOSIS — E871 Hypo-osmolality and hyponatremia: Secondary | ICD-10-CM | POA: Diagnosis present

## 2023-10-30 DIAGNOSIS — I5043 Acute on chronic combined systolic (congestive) and diastolic (congestive) heart failure: Secondary | ICD-10-CM

## 2023-10-30 DIAGNOSIS — E876 Hypokalemia: Secondary | ICD-10-CM | POA: Diagnosis present

## 2023-10-30 DIAGNOSIS — Z5986 Financial insecurity: Secondary | ICD-10-CM

## 2023-10-30 DIAGNOSIS — Z8249 Family history of ischemic heart disease and other diseases of the circulatory system: Secondary | ICD-10-CM

## 2023-10-30 DIAGNOSIS — Z1152 Encounter for screening for COVID-19: Secondary | ICD-10-CM

## 2023-10-30 DIAGNOSIS — J9621 Acute and chronic respiratory failure with hypoxia: Secondary | ICD-10-CM | POA: Diagnosis present

## 2023-10-30 DIAGNOSIS — A419 Sepsis, unspecified organism: Secondary | ICD-10-CM | POA: Diagnosis not present

## 2023-10-30 DIAGNOSIS — J9622 Acute and chronic respiratory failure with hypercapnia: Secondary | ICD-10-CM | POA: Diagnosis present

## 2023-10-30 DIAGNOSIS — E0800 Diabetes mellitus due to underlying condition with hyperosmolarity without nonketotic hyperglycemic-hyperosmolar coma (NKHHC): Secondary | ICD-10-CM

## 2023-10-30 DIAGNOSIS — G4730 Sleep apnea, unspecified: Secondary | ICD-10-CM

## 2023-10-30 DIAGNOSIS — I420 Dilated cardiomyopathy: Secondary | ICD-10-CM

## 2023-10-30 DIAGNOSIS — J9601 Acute respiratory failure with hypoxia: Secondary | ICD-10-CM

## 2023-10-30 DIAGNOSIS — E119 Type 2 diabetes mellitus without complications: Secondary | ICD-10-CM | POA: Diagnosis present

## 2023-10-30 DIAGNOSIS — H02402 Unspecified ptosis of left eyelid: Secondary | ICD-10-CM | POA: Diagnosis present

## 2023-10-30 DIAGNOSIS — Z5941 Food insecurity: Secondary | ICD-10-CM

## 2023-10-30 DIAGNOSIS — Z7984 Long term (current) use of oral hypoglycemic drugs: Secondary | ICD-10-CM

## 2023-10-30 DIAGNOSIS — R6521 Severe sepsis with septic shock: Secondary | ICD-10-CM | POA: Diagnosis present

## 2023-10-30 DIAGNOSIS — Z9981 Dependence on supplemental oxygen: Secondary | ICD-10-CM

## 2023-10-30 DIAGNOSIS — E662 Morbid (severe) obesity with alveolar hypoventilation: Secondary | ICD-10-CM

## 2023-10-30 DIAGNOSIS — Z87891 Personal history of nicotine dependence: Secondary | ICD-10-CM

## 2023-10-30 DIAGNOSIS — H0520 Unspecified exophthalmos: Secondary | ICD-10-CM | POA: Diagnosis present

## 2023-10-30 DIAGNOSIS — Z794 Long term (current) use of insulin: Secondary | ICD-10-CM

## 2023-10-30 DIAGNOSIS — E785 Hyperlipidemia, unspecified: Secondary | ICD-10-CM | POA: Diagnosis present

## 2023-10-30 DIAGNOSIS — Z5982 Transportation insecurity: Secondary | ICD-10-CM

## 2023-10-30 DIAGNOSIS — I272 Pulmonary hypertension, unspecified: Secondary | ICD-10-CM | POA: Diagnosis present

## 2023-10-30 DIAGNOSIS — E8729 Other acidosis: Secondary | ICD-10-CM | POA: Diagnosis present

## 2023-10-30 DIAGNOSIS — G9341 Metabolic encephalopathy: Secondary | ICD-10-CM | POA: Diagnosis present

## 2023-10-30 DIAGNOSIS — I5021 Acute systolic (congestive) heart failure: Secondary | ICD-10-CM | POA: Diagnosis present

## 2023-10-30 DIAGNOSIS — I110 Hypertensive heart disease with heart failure: Secondary | ICD-10-CM | POA: Diagnosis present

## 2023-10-30 DIAGNOSIS — A411 Sepsis due to other specified staphylococcus: Secondary | ICD-10-CM | POA: Diagnosis not present

## 2023-10-30 DIAGNOSIS — Z6841 Body Mass Index (BMI) 40.0 and over, adult: Secondary | ICD-10-CM

## 2023-10-30 DIAGNOSIS — Z79899 Other long term (current) drug therapy: Secondary | ICD-10-CM

## 2023-10-30 DIAGNOSIS — I2489 Other forms of acute ischemic heart disease: Secondary | ICD-10-CM | POA: Diagnosis present

## 2023-10-30 HISTORY — DX: Obesity, unspecified: E66.9

## 2023-10-30 HISTORY — DX: Essential (primary) hypertension: I10

## 2023-10-30 HISTORY — DX: Respiratory failure, unspecified, unspecified whether with hypoxia or hypercapnia: J96.90

## 2023-10-30 HISTORY — DX: Chronic respiratory failure with hypoxia: J96.11

## 2023-10-30 HISTORY — DX: Cardiomyopathy, unspecified: I42.9

## 2023-10-30 HISTORY — DX: Type 2 diabetes mellitus without complications: E11.9

## 2023-10-30 HISTORY — DX: Obstructive sleep apnea (adult) (pediatric): G47.33

## 2023-10-30 HISTORY — DX: Unspecified systolic (congestive) heart failure: I50.20

## 2023-10-30 HISTORY — DX: Morbid (severe) obesity with alveolar hypoventilation: E66.2

## 2023-10-30 MED ORDER — ONDANSETRON HCL 4 MG/2ML IJ SOLN
INTRAMUSCULAR | Status: AC
  Filled 2023-10-30: qty 2

## 2023-10-30 MED ORDER — ETOMIDATE 2 MG/ML IV SOLN
INTRAVENOUS | Status: AC
  Administered 2023-10-31: 40 mg via INTRAVENOUS
  Filled 2023-10-30: qty 10

## 2023-10-30 MED ORDER — ALBUTEROL SULFATE (2.5 MG/3ML) 0.083% IN NEBU
5.0000 mg | INHALATION_SOLUTION | Freq: Once | RESPIRATORY_TRACT | Status: AC
  Administered 2023-10-31: 5 mg via RESPIRATORY_TRACT
  Filled 2023-10-30: qty 6

## 2023-10-30 MED ORDER — MAGNESIUM SULFATE 2 GM/50ML IV SOLN
2.0000 g | Freq: Once | INTRAVENOUS | Status: AC
  Administered 2023-10-31: 2 g via INTRAVENOUS
  Filled 2023-10-30: qty 50

## 2023-10-30 MED ORDER — METHYLPREDNISOLONE SODIUM SUCC 125 MG IJ SOLR
125.0000 mg | Freq: Once | INTRAMUSCULAR | Status: AC
  Administered 2023-10-31: 125 mg via INTRAVENOUS
  Filled 2023-10-30: qty 2

## 2023-10-30 MED ORDER — ONDANSETRON HCL 4 MG/2ML IJ SOLN
4.0000 mg | Freq: Once | INTRAMUSCULAR | Status: AC
  Administered 2023-10-30: 4 mg via INTRAVENOUS

## 2023-10-30 MED ORDER — SUCCINYLCHOLINE CHLORIDE 200 MG/10ML IV SOSY
PREFILLED_SYRINGE | INTRAVENOUS | Status: AC
Start: 2023-10-30 — End: 2023-10-31
  Administered 2023-10-31: 150 mg via INTRAVENOUS
  Filled 2023-10-30: qty 10

## 2023-10-30 NOTE — ED Notes (Signed)
 VO from ward, MD for 4mg  zofran

## 2023-10-30 NOTE — ED Triage Notes (Signed)
 Pt arrives via EMS for sob. Pt was 84% on 4L upon ems arrival. Pt was alert&oriented upon ems arrival to scene and became unresponsive enroute. Pt arrived to ER on cpap and immediately moved to bipap by RT. Pt responsive to painful stimuli

## 2023-10-30 NOTE — ED Notes (Addendum)
 RT at bedside,Ward, DO preparing for intubation

## 2023-10-30 NOTE — ED Provider Notes (Addendum)
 [facility] Provider Note    Event Date/Time   First MD Initiated Contact with Patient 10/30/23 2341     (approximate)   History   Shortness of Breath   HPI  Clifford Nichols is a 43 y.o. male with history of obesity, obstructive sleep apnea, chronic respiratory failure on 4 L of oxygen who presents to the emergency department with respiratory distress.  Per EMS, patient's oxygen saturation was in the low 80s on his normal 4 L.  Placed on nonrebreather by fire department and then transition to CPAP.  Patient was initially awake and talking but has now become unresponsive.  EMS reports they did give 2 breathing treatments and 125 mg of IV Solu-Medrol  and route but thinks that the IV infiltrated and he received the steroids subcutaneously.   History provided by EMS, family.    Past Medical History:  Diagnosis Date   Obesity    Obesity hypoventilation syndrome (HCC)    OSA (obstructive sleep apnea)     History reviewed. No pertinent surgical history.  MEDICATIONS:  Prior to Admission medications   Not on File    Physical Exam   Triage Vital Signs: ED Triage Vitals  Encounter Vitals Group     BP      Systolic BP Percentile      Diastolic BP Percentile      Pulse      Resp      Temp      Temp src      SpO2      Weight      Height      Head Circumference      Peak Flow      Pain Score      Pain Loc      Pain Education      Exclude from Growth Chart     Most recent vital signs: Vitals:   10/31/23 0215 10/31/23 0236  BP: (!) 136/91 115/76  Pulse: 96 99  Resp: (!) 28 (!) 28  Temp: (!) 101.2 F (38.4 C) (!) 101.1 F (38.4 C)  SpO2: (!) 83% (!) 85%    CONSTITUTIONAL: Unresponsive, diaphoretic, GCS 3 HEAD: Normocephalic, atraumatic EYES: Conjunctivae clear, pupils appear equal, sclera nonicteric, proptosis of both eyes ENT: normal nose; moist mucous membranes NECK: Supple, normal ROM, scar to the anterior neck that looks like  a prior tracheostomy scar CARD: RRR; S1 and S2 appreciated; scattered small abscesses with purulent drainage noted to his anterior chest wall with surrounding redness and warmth RESP: Very diminished aeration diffusely, scattered wheezes, no rales or rhonchi, hypoxic on CPAP, hyperventilating ABD/GI: Non-distended; soft, non-tender, no rebound, no guarding, no peritoneal signs BACK: The back appears normal EXT: Normal ROM in all joints; no deformity noted, no edema SKIN: Normal color for age and race; warm; no rash on exposed skin NEURO: Moves all extremities equally, normal speech PSYCH: The patient's mood and manner are appropriate.   ED Results / Procedures / Treatments   LABS: (all labs ordered are listed, but only abnormal results are displayed) Labs Reviewed  CBC WITH DIFFERENTIAL/PLATELET - Abnormal; Notable for the following components:      Result Value   WBC 15.3 (*)    HCT 54.2 (*)    MCHC 27.7 (*)    nRBC 2.2 (*)    Neutro Abs 10.1 (*)    Abs Immature Granulocytes 0.30 (*)    All other components within normal limits  COMPREHENSIVE METABOLIC PANEL WITH  GFR - Abnormal; Notable for the following components:   Chloride 95 (*)    CO2 33 (*)    Glucose, Bld 230 (*)    Calcium  8.2 (*)    Total Protein 9.0 (*)    ALT 46 (*)    All other components within normal limits  BRAIN NATRIURETIC PEPTIDE - Abnormal; Notable for the following components:   B Natriuretic Peptide 243.7 (*)    All other components within normal limits  D-DIMER, QUANTITATIVE - Abnormal; Notable for the following components:   D-Dimer, Quant 1.10 (*)    All other components within normal limits  BLOOD GAS, ARTERIAL - Abnormal; Notable for the following components:   pH, Arterial 7.23 (*)    pCO2 arterial 83 (*)    pO2, Arterial 59 (*)    Bicarbonate 34.8 (*)    Acid-Base Excess 4.4 (*)    All other components within normal limits  CBG MONITORING, ED - Abnormal; Notable for the following components:    Glucose-Capillary 239 (*)    All other components within normal limits  TROPONIN I (HIGH SENSITIVITY) - Abnormal; Notable for the following components:   Troponin I (High Sensitivity) 95 (*)    All other components within normal limits  TROPONIN I (HIGH SENSITIVITY) - Abnormal; Notable for the following components:   Troponin I (High Sensitivity) 70 (*)    All other components within normal limits  RESP PANEL BY RT-PCR (RSV, FLU A&B, COVID)  [id number]  CULTURE, BLOOD (ROUTINE X 2)  CULTURE, BLOOD (ROUTINE X 2)  LACTIC ACID, PLASMA  PROCALCITONIN  LACTIC ACID, PLASMA  HIV ANTIBODY (ROUTINE TESTING W REFLEX)  LEGIONELLA PNEUMOPHILA SEROGP 1 UR AG  STREP PNEUMONIAE URINARY ANTIGEN  BASIC METABOLIC PANEL WITH GFR     EKG:  EKG Interpretation Date/Time:  Monday October 30 2023 23:38:59 EDT Ventricular Rate:  122 PR Interval:  150 QRS Duration:  97 QT Interval:  319 QTC Calculation: 455 R Axis:   -84  Text Interpretation: Sinus tachycardia Probable anterolateral infarct, recent Baseline wander in lead(s) V6 Confirmed by Verneda Golder [phone]) on 10/30/2023 11:44:21 PM         RADIOLOGY: My personal review and interpretation of imaging: Chest x-ray shows poor lung volumes with atelectasis versus infiltrate.  I have personally reviewed all radiology reports.   DG Abdomen 1 View Result Date: 10/31/2023 CLINICAL DATA:  Orogastric tube placement. EXAM: ABDOMEN - 1 VIEW COMPARISON:  Dec 13, 2019 FINDINGS: There is stable endotracheal tube positioning. An enteric tube is noted with its distal end overlying the body of the stomach. The distal side hole sits approximately 7.8 cm from the gastroesophageal junction. The bowel gas pattern is normal. No radio-opaque calculi or other significant radiographic abnormality are seen. IMPRESSION: Enteric tube positioning, as described above. Electronically Signed   By: Virgle Grime M.D.   On: 10/31/2023 01:20   DG Chest Portable 1 View Result  Date: 10/31/2023 CLINICAL DATA:  Status post intubation EXAM: PORTABLE CHEST 1 VIEW COMPARISON:  October 30, 2023 FINDINGS: Since the prior study there is been interval placement of an endotracheal tube with its distal tip approximately 3.8 cm from the carina. The cardiac silhouette is enlarged and unchanged in size. Low lung volumes are seen with mild left perihilar, left suprahilar and right basilar atelectasis and/or infiltrate. No pleural effusion or pneumothorax is identified. The visualized skeletal structures are unremarkable. IMPRESSION: 1. Interval endotracheal tube placement, as described above. 2. Low lung volumes with mild left  perihilar, left suprahilar and right basilar atelectasis and/or infiltrate. Electronically Signed   By: Virgle Grime M.D.   On: 10/31/2023 01:16   DG Chest Portable 1 View Result Date: 10/31/2023 CLINICAL DATA:  Respiratory distress. EXAM: PORTABLE CHEST 1 VIEW COMPARISON:  April 23, 2020 FINDINGS: The cardiac silhouette is enlarged. This may be, in part, technical in origin. Low lung volumes are seen with mild prominence of the central pulmonary vasculature. Prominence of the bronchovascular lung markings is also noted. Mild atelectasis and/or infiltrate is suspected within the right lung base. No pleural effusion or pneumothorax is identified. The visualized skeletal structures are unremarkable. IMPRESSION: Low lung volumes with mild right basilar atelectasis and/or infiltrate. Electronically Signed   By: Virgle Grime M.D.   On: 10/31/2023 01:14     PROCEDURES:  Critical Care performed: Yes, see critical care procedure note(s)   CRITICAL CARE Performed by: Starling Eck Jenin Birdsall   Total critical care time: 45 minutes  Critical care time was exclusive of separately billable procedures and treating other patients.  Critical care was necessary to treat or prevent imminent or life-threatening deterioration.  Critical care was time spent personally by me on the  following activities: development of treatment plan with patient and/or surrogate as well as nursing, discussions with consultants, evaluation of patient's response to treatment, examination of patient, obtaining history from patient or surrogate, ordering and performing treatments and interventions, ordering and review of laboratory studies, ordering and review of radiographic studies, pulse oximetry and re-evaluation of patient's condition.    INTUBATION Performed by: Verneda Golder  Required items: required blood products, implants, devices, and special equipment available Patient identity confirmed: provided demographic data and hospital-assigned identification number Time out: Immediately prior to procedure a "time out" was called to verify the correct patient, procedure, equipment, support staff and site/side marked as required.  Indications: GCS 3, respiratory failure  Intubation method: Glidescope Laryngoscopy   Preoxygenation: BVM  Sedatives: None, etomidate  given after for sedation Paralytic: None, succinylcholine  given after for paralyzation for hypoxia  Tube Size: 8.0 cuffed  Post-procedure assessment: chest rise and ETCO2 monitor Breath sounds: equal and absent over the epigastrium Tube secured with: ETT holder Chest x-ray interpreted by radiologist and me.  Chest x-ray findings: endotracheal tube in appropriate position, 25 at the lip  Patient tolerated the procedure well with no immediate complications.    Aaron Aas1-3 Lead EKG Interpretation  Performed by: Lilana Blasko, Clover Dao, DO Authorized by: Markeia Harkless, Clover Dao, DO     Interpretation: abnormal     ECG rate:  103   ECG rate assessment: tachycardic     Rhythm: sinus tachycardia     Ectopy: none     Conduction: normal       IMPRESSION / MDM / ASSESSMENT AND PLAN / ED COURSE  I reviewed the triage vital signs and the nursing notes.    Patient here for severe respiratory distress, hypoxia, altered mental status.  The  patient is on the cardiac monitor to evaluate for evidence of arrhythmia and/or significant heart rate changes.   DIFFERENTIAL DIAGNOSIS (includes but not limited to):   Obesity hypoventilation syndrome, acute respiratory failure with hypoxia and hypercapnia, respiratory acidosis, pneumonia, PE, CHF, COPD   Patient's presentation is most consistent with acute presentation with potential threat to life or bodily function.   PLAN: Patient here with severe respiratory distress, hypoxia and now altered mental status with a GCS of 3.  Patient initially transition to BiPAP but sats still running in the 80s  and given he was nonresponsive, decision made to intubate patient emergently.  Given patient's severe morbid obesity and concern for loss of tone making airway very difficult to obtain, decision was made to intubate without sedation or paralytics.  Intubation went well.  He was coughing immediately after intubation so was then given etomidate  and succinylcholine  and started on fentanyl  infusion.  Will obtain labs, cultures, urine, COVID and flu swabs.  Will discuss with ICU team.  Will obtain ABG.  Will give breathing treatments, IV Solu-Medrol , magnesium  for possible obstructive process.   MEDICATIONS GIVEN IN ED: Medications  fentaNYL  in NS 250mL (39mcg/ml) infusion-PREMIX (225 mcg/hr Intravenous Rate/Dose Change 10/31/23 0238)  norepinephrine  (LEVOPHED ) 4mg  in (0.016 mg/mL) premix infusion (10 mcg/min Intravenous Rate/Dose Change 10/31/23 0216)  midazolam  (VERSED ) 100 mg/100 mL (1 mg/mL) premix infusion (2 mg/hr Intravenous Rate/Dose Change 10/31/23 0235)  metroNIDAZOLE  (FLAGYL ) IVPB 500 mg (has no administration in time range)  vancomycin  (VANCOCIN ) IVPB 1000 mg/200 mL premix (has no administration in time range)  docusate (COLACE) 50 MG/5ML liquid 100 mg (has no administration in time range)  polyethylene glycol (MIRALAX  / GLYCOLAX ) packet 17 g (has no administration in time range)   heparin  injection 5,000 Units (has no administration in time range)  ipratropium-albuterol  (DUONEB) 0.5-2.5 (3) MG/3ML nebulizer solution 3 mL (has no administration in time range)  ipratropium-albuterol  (DUONEB) 0.5-2.5 (3) MG/3ML nebulizer solution 3 mL (has no administration in time range)  propofol  (DIPRIVAN ) 1000 MG/100ML infusion (has no administration in time range)  albuterol  (PROVENTIL ) (2.5 MG/3ML) 0.083% nebulizer solution 5 mg (5 mg Nebulization Given 10/31/23 0059)  magnesium  sulfate IVPB 2 g 50 mL (0 g Intravenous Stopped 10/31/23 0203)  methylPREDNISolone  sodium succinate (SOLU-MEDROL ) 125 mg/2 mL injection 125 mg (125 mg Intravenous Given 10/31/23 0055)  ondansetron  (ZOFRAN ) injection 4 mg (4 mg Intravenous Given 10/30/23 2356)  etomidate  (AMIDATE ) injection 40 mg (40 mg Intravenous Given 10/31/23 0005)  succinylcholine  (ANECTINE ) syringe 150 mg (150 mg Intravenous Given 10/31/23 0005)  propofol  (DIPRIVAN ) 1000 MG/100ML infusion (  Return to [facility] 10/31/23 0221)  rocuronium  (ZEMURON ) injection 150 mg (150 mg Intravenous Given 10/31/23 0026)  fentaNYL  (SUBLIMAZE ) injection 200 mcg (200 mcg Intravenous Given 10/31/23 0020)  fentaNYL  (SUBLIMAZE ) injection 200 mcg (200 mcg Intravenous Given 10/31/23 0025)  lactated ringers  bolus 3,000 mL (3,000 mLs Intravenous New Bag/Given 10/31/23 0117)  ceFEPIme  (MAXIPIME ) 2 g in sodium chloride  0.9 % 100 mL IVPB (2 g Intravenous New Bag/Given 10/31/23 0204)  acetaminophen  (OFIRMEV ) IV 1,000 mg (0 mg Intravenous Stopped 10/31/23 0201)  iohexol  (OMNIPAQUE ) 350 MG/ML injection 100 mL (100 mLs Intravenous Contrast Given 10/31/23 0250)     ED COURSE: Patient's labs show leukocytosis of 15,000 with left shift.  He does have a fever.  Septic workup initiated with cultures pending.  Getting IV fluids and broad-spectrum antibiotics.  He was hypotensive here and is getting 3 L of fluid for 30 mg/kg IV fluid bolus of his ideal body weight.  Patient started on Levophed  as well.   Lactic normal.  Procalcitonin 0.27.  COVID, flu and RSV negative.  ABG shows respiratory acidosis with low PaO2 despite being on a PEEP of 14 with FiO2 of 100%.  Sats are running 84%.  Will improve briefly to 85% when we switch to bagging him manually but no higher than this.  This is not improving with sedation or paralytics.  Chest x-ray reviewed and interpreted by myself and the radiologist and shows that the tube is in the  appropriate place and there is no pneumothorax but he does have low pulmonary volumes and atelectasis versus infiltrate.  Discussed with ICU team who recommends transitioning from [facility] to volume control.  His source of sepsis could be respiratory related but he also appears multiple scattered abscesses draining to the anterior chest wall with cellulitis.  He is getting broad antibiotics to cover for both pneumonia and cellulitis.  Troponins elevated but downtrending likely from demand ischemia from respiratory distress and sepsis.  BNP elevated to 43 but no pulmonary edema seen on chest x-ray and no peripheral edema on exam.  D-dimer also elevated.  CTA of the chest has been ordered when patient is more stable.  Patient's family has been updated.  0120 Sepsis Reassessment completed.   CONSULTS:  Consulted and discussed patient's case with ICU provider, Alonza Arthurs, NP.  I have recommended admission and consulting physician agrees and will place admission orders.  Patient (and family if present) agree with this plan.   I reviewed all nursing notes, vitals, pertinent previous records.  All labs, EKGs, imaging ordered have been independently reviewed and interpreted by myself.    OUTSIDE RECORDS REVIEWED: Reviewed patient's recent pulmonology notes.       FINAL CLINICAL IMPRESSION(S) / ED DIAGNOSES   Final diagnoses:  Acute sepsis (HCC)  Septic shock (HCC)  Cellulitis of chest wall  Acute respiratory failure with hypoxia and hypercapnia (HCC)     Rx / DC  Orders   ED Discharge Orders     None        Note:  This document was prepared using Dragon voice recognition software and may include unintentional dictation errors.   Rubbie Goostree, Clover Dao, DO 10/31/23 0305    Jameson Tormey, Clover Dao, DO 11/23/23 1610

## 2023-10-31 ENCOUNTER — Inpatient Hospital Stay

## 2023-10-31 ENCOUNTER — Emergency Department

## 2023-10-31 ENCOUNTER — Other Ambulatory Visit: Payer: Self-pay

## 2023-10-31 ENCOUNTER — Encounter: Payer: Self-pay | Admitting: Internal Medicine

## 2023-10-31 ENCOUNTER — Inpatient Hospital Stay (HOSPITAL_COMMUNITY)
Admit: 2023-10-31 | Discharge: 2023-10-31 | Disposition: A | Discharge status: Home / Self Care | Attending: Critical Care Medicine | Admitting: Critical Care Medicine

## 2023-10-31 DIAGNOSIS — I110 Hypertensive heart disease with heart failure: Secondary | ICD-10-CM | POA: Diagnosis present

## 2023-10-31 DIAGNOSIS — E662 Morbid (severe) obesity with alveolar hypoventilation: Secondary | ICD-10-CM | POA: Diagnosis present

## 2023-10-31 DIAGNOSIS — Z794 Long term (current) use of insulin: Secondary | ICD-10-CM | POA: Diagnosis not present

## 2023-10-31 DIAGNOSIS — A411 Sepsis due to other specified staphylococcus: Secondary | ICD-10-CM | POA: Diagnosis present

## 2023-10-31 DIAGNOSIS — I5043 Acute on chronic combined systolic (congestive) and diastolic (congestive) heart failure: Secondary | ICD-10-CM | POA: Diagnosis not present

## 2023-10-31 DIAGNOSIS — E876 Hypokalemia: Secondary | ICD-10-CM | POA: Diagnosis present

## 2023-10-31 DIAGNOSIS — Z1152 Encounter for screening for COVID-19: Secondary | ICD-10-CM | POA: Diagnosis not present

## 2023-10-31 DIAGNOSIS — Z6841 Body Mass Index (BMI) 40.0 and over, adult: Secondary | ICD-10-CM | POA: Diagnosis not present

## 2023-10-31 DIAGNOSIS — G4730 Sleep apnea, unspecified: Secondary | ICD-10-CM | POA: Diagnosis not present

## 2023-10-31 DIAGNOSIS — J9622 Acute and chronic respiratory failure with hypercapnia: Secondary | ICD-10-CM | POA: Diagnosis present

## 2023-10-31 DIAGNOSIS — E871 Hypo-osmolality and hyponatremia: Secondary | ICD-10-CM | POA: Diagnosis present

## 2023-10-31 DIAGNOSIS — R0603 Acute respiratory distress: Secondary | ICD-10-CM

## 2023-10-31 DIAGNOSIS — Z9981 Dependence on supplemental oxygen: Secondary | ICD-10-CM | POA: Diagnosis not present

## 2023-10-31 DIAGNOSIS — Z8249 Family history of ischemic heart disease and other diseases of the circulatory system: Secondary | ICD-10-CM | POA: Diagnosis not present

## 2023-10-31 DIAGNOSIS — E785 Hyperlipidemia, unspecified: Secondary | ICD-10-CM | POA: Diagnosis present

## 2023-10-31 DIAGNOSIS — I272 Pulmonary hypertension, unspecified: Secondary | ICD-10-CM | POA: Diagnosis present

## 2023-10-31 DIAGNOSIS — A419 Sepsis, unspecified organism: Secondary | ICD-10-CM | POA: Diagnosis present

## 2023-10-31 DIAGNOSIS — J9602 Acute respiratory failure with hypercapnia: Secondary | ICD-10-CM | POA: Diagnosis not present

## 2023-10-31 DIAGNOSIS — Z79899 Other long term (current) drug therapy: Secondary | ICD-10-CM | POA: Diagnosis not present

## 2023-10-31 DIAGNOSIS — N179 Acute kidney failure, unspecified: Secondary | ICD-10-CM | POA: Diagnosis present

## 2023-10-31 DIAGNOSIS — I420 Dilated cardiomyopathy: Secondary | ICD-10-CM | POA: Diagnosis present

## 2023-10-31 DIAGNOSIS — J189 Pneumonia, unspecified organism: Secondary | ICD-10-CM | POA: Diagnosis not present

## 2023-10-31 DIAGNOSIS — E119 Type 2 diabetes mellitus without complications: Secondary | ICD-10-CM

## 2023-10-31 DIAGNOSIS — I5021 Acute systolic (congestive) heart failure: Secondary | ICD-10-CM | POA: Diagnosis present

## 2023-10-31 DIAGNOSIS — E8729 Other acidosis: Secondary | ICD-10-CM | POA: Diagnosis present

## 2023-10-31 DIAGNOSIS — J9601 Acute respiratory failure with hypoxia: Secondary | ICD-10-CM | POA: Diagnosis not present

## 2023-10-31 DIAGNOSIS — I2489 Other forms of acute ischemic heart disease: Secondary | ICD-10-CM | POA: Diagnosis present

## 2023-10-31 DIAGNOSIS — G9341 Metabolic encephalopathy: Secondary | ICD-10-CM

## 2023-10-31 DIAGNOSIS — G4733 Obstructive sleep apnea (adult) (pediatric): Secondary | ICD-10-CM | POA: Diagnosis not present

## 2023-10-31 DIAGNOSIS — Z7984 Long term (current) use of oral hypoglycemic drugs: Secondary | ICD-10-CM | POA: Diagnosis not present

## 2023-10-31 DIAGNOSIS — I429 Cardiomyopathy, unspecified: Secondary | ICD-10-CM | POA: Diagnosis not present

## 2023-10-31 DIAGNOSIS — J9621 Acute and chronic respiratory failure with hypoxia: Secondary | ICD-10-CM | POA: Diagnosis present

## 2023-10-31 DIAGNOSIS — R6521 Severe sepsis with septic shock: Secondary | ICD-10-CM | POA: Diagnosis present

## 2023-10-31 LAB — RESPIRATORY PANEL BY PCR

## 2023-10-31 LAB — RESP PANEL BY RT-PCR (RSV, FLU A&B, COVID)  RVPGX2
Influenza A by PCR: NEGATIVE
Influenza B by PCR: NEGATIVE
Resp Syncytial Virus by PCR: NEGATIVE
SARS Coronavirus 2 by RT PCR: NEGATIVE

## 2023-10-31 LAB — BLOOD GAS, ARTERIAL
Acid-Base Excess: 0.9 mmol/L (ref 0.0–2.0)
Acid-Base Excess: 2.4 mmol/L — ABNORMAL HIGH (ref 0.0–2.0)
Acid-Base Excess: 4.4 mmol/L — ABNORMAL HIGH (ref 0.0–2.0)
Acid-base deficit: 0.3 mmol/L (ref 0.0–2.0)
Bicarbonate: 27.7 mmol/L (ref 20.0–28.0)
Bicarbonate: 29.4 mmol/L — ABNORMAL HIGH (ref 20.0–28.0)
Bicarbonate: 29.9 mmol/L — ABNORMAL HIGH (ref 20.0–28.0)
Bicarbonate: 34.8 mmol/L — ABNORMAL HIGH (ref 20.0–28.0)
FIO2: 100 %
FIO2: 100 %
FIO2: 100 %
FIO2: 100 %
MECHVT: 520 mL
MECHVT: 520 mL
MECHVT: 540 mL
MECHVT: 540 mL
Mechanical Rate: 15
Mechanical Rate: 15
Mechanical Rate: 18
Mechanical Rate: 28
O2 Saturation: 85.4 %
O2 Saturation: 86.2 %
O2 Saturation: 89.1 %
O2 Saturation: 90.1 %
PEEP: 12 cmH2O
PEEP: 12 cmH2O
PEEP: 14 cmH2O
PEEP: 16 cmH2O
Patient temperature: 37
Patient temperature: 37
Patient temperature: 37
Patient temperature: 37
Spontaneous VT: 520 mL
pCO2 arterial: 58 mmHg — ABNORMAL HIGH (ref 32–48)
pCO2 arterial: 59 mmHg — ABNORMAL HIGH (ref 32–48)
pCO2 arterial: 64 mmHg — ABNORMAL HIGH (ref 32–48)
pCO2 arterial: 83 mmHg (ref 32–48)
pH, Arterial: 7.23 — ABNORMAL LOW (ref 7.35–7.45)
pH, Arterial: 7.27 — ABNORMAL LOW (ref 7.35–7.45)
pH, Arterial: 7.28 — ABNORMAL LOW (ref 7.35–7.45)
pH, Arterial: 7.32 — ABNORMAL LOW (ref 7.35–7.45)
pO2, Arterial: 55 mmHg — ABNORMAL LOW (ref 83–108)
pO2, Arterial: 59 mmHg — ABNORMAL LOW (ref 83–108)
pO2, Arterial: 62 mmHg — ABNORMAL LOW (ref 83–108)
pO2, Arterial: 62 mmHg — ABNORMAL LOW (ref 83–108)

## 2023-10-31 LAB — D-DIMER, QUANTITATIVE: D-Dimer, Quant: 1.1 ug{FEU}/mL — ABNORMAL HIGH (ref 0.00–0.50)

## 2023-10-31 LAB — BASIC METABOLIC PANEL WITH GFR
Anion gap: 12 (ref 5–15)
BUN: 19 mg/dL (ref 6–20)
CO2: 22 mmol/L (ref 22–32)
Calcium: 8 mg/dL — ABNORMAL LOW (ref 8.9–10.3)
Chloride: 96 mmol/L — ABNORMAL LOW (ref 98–111)
Creatinine, Ser: 1.31 mg/dL — ABNORMAL HIGH (ref 0.61–1.24)
GFR, Estimated: >60 mL/min (ref >60)
Glucose, Bld: 272 mg/dL — ABNORMAL HIGH (ref 70–99)
Potassium: 4.4 mmol/L (ref 3.5–5.1)
Sodium: 130 mmol/L — ABNORMAL LOW (ref 135–145)

## 2023-10-31 LAB — CBC WITH DIFFERENTIAL/PLATELET
Abs Immature Granulocytes: 0.3 10*3/uL — ABNORMAL HIGH (ref 0.00–0.07)
Basophils Absolute: 0.1 10*3/uL (ref 0.0–0.1)
Basophils Relative: 0 %
Eosinophils Absolute: 0.1 10*3/uL (ref 0.0–0.5)
Eosinophils Relative: 1 %
HCT: 54.2 % — ABNORMAL HIGH (ref 39.0–52.0)
Hemoglobin: 15 g/dL (ref 13.0–17.0)
Immature Granulocytes: 2 %
Lymphocytes Relative: 25 %
Lymphs Abs: 3.8 10*3/uL (ref 0.7–4.0)
MCH: 26.3 pg (ref 26.0–34.0)
MCHC: 27.7 g/dL — ABNORMAL LOW (ref 30.0–36.0)
MCV: 95.1 fL (ref 80.0–100.0)
Monocytes Absolute: 1 10*3/uL (ref 0.1–1.0)
Monocytes Relative: 6 %
Neutro Abs: 10.1 10*3/uL — ABNORMAL HIGH (ref 1.7–7.7)
Neutrophils Relative %: 66 %
Platelets: 195 10*3/uL (ref 150–400)
RBC: 5.7 MIL/uL (ref 4.22–5.81)
RDW: 14.9 % (ref 11.5–15.5)
WBC: 15.3 10*3/uL — ABNORMAL HIGH (ref 4.0–10.5)
nRBC: 2.2 % — ABNORMAL HIGH (ref 0.0–0.2)

## 2023-10-31 LAB — COMPREHENSIVE METABOLIC PANEL WITH GFR
ALT: 46 U/L — ABNORMAL HIGH (ref 0–44)
AST: 37 U/L (ref 15–41)
Albumin: 3.7 g/dL (ref 3.5–5.0)
Alkaline Phosphatase: 61 U/L (ref 38–126)
Anion gap: 8 (ref 5–15)
BUN: 19 mg/dL (ref 6–20)
CO2: 33 mmol/L — ABNORMAL HIGH (ref 22–32)
Calcium: 8.2 mg/dL — ABNORMAL LOW (ref 8.9–10.3)
Chloride: 95 mmol/L — ABNORMAL LOW (ref 98–111)
Creatinine, Ser: 1.09 mg/dL (ref 0.61–1.24)
GFR, Estimated: >60 mL/min (ref >60)
Glucose, Bld: 230 mg/dL — ABNORMAL HIGH (ref 70–99)
Potassium: 4.5 mmol/L (ref 3.5–5.1)
Sodium: 136 mmol/L (ref 135–145)
Total Bilirubin: 0.9 mg/dL (ref 0.0–1.2)
Total Protein: 9 g/dL — ABNORMAL HIGH (ref 6.5–8.1)

## 2023-10-31 LAB — GLUCOSE, CAPILLARY
Glucose-Capillary: 274 mg/dL — ABNORMAL HIGH (ref 70–99)
Glucose-Capillary: 244 mg/dL — ABNORMAL HIGH (ref 70–99)

## 2023-10-31 LAB — BRAIN NATRIURETIC PEPTIDE
B Natriuretic Peptide: 129.1 pg/mL — ABNORMAL HIGH (ref 0.0–100.0)
B Natriuretic Peptide: 243.7 pg/mL — ABNORMAL HIGH (ref 0.0–100.0)

## 2023-10-31 LAB — STREP PNEUMONIAE URINARY ANTIGEN: Strep Pneumo Urinary Antigen: NEGATIVE

## 2023-10-31 LAB — LACTIC ACID, PLASMA
Lactic Acid, Venous: 1.3 mmol/L (ref 0.5–1.9)
Lactic Acid, Venous: 1.8 mmol/L (ref 0.5–1.9)

## 2023-10-31 LAB — TROPONIN I (HIGH SENSITIVITY)
Troponin I (High Sensitivity): 70 ng/L — ABNORMAL HIGH (ref <18)
Troponin I (High Sensitivity): 95 ng/L — ABNORMAL HIGH (ref <18)

## 2023-10-31 LAB — MAGNESIUM: Magnesium: 2.1 mg/dL (ref 1.7–2.4)

## 2023-10-31 LAB — CBG MONITORING, ED: Glucose-Capillary: 239 mg/dL — ABNORMAL HIGH (ref 70–99)

## 2023-10-31 LAB — PROCALCITONIN: Procalcitonin: 0.27 ng/mL

## 2023-10-31 LAB — PHOSPHORUS: Phosphorus: 2.2 mg/dL — ABNORMAL LOW (ref 2.5–4.6)

## 2023-10-31 LAB — HIV ANTIBODY (ROUTINE TESTING W REFLEX): HIV Screen 4th Generation wRfx: NONREACTIVE

## 2023-10-31 MED ORDER — BUDESONIDE 0.25 MG/2ML IN SUSP
0.2500 mg | Freq: Two times a day (BID) | RESPIRATORY_TRACT | Status: DC
  Administered 2023-10-31 – 2023-11-15 (×31): 0.25 mg via RESPIRATORY_TRACT
  Filled 2023-10-31 (×32): qty 2

## 2023-10-31 MED ORDER — ROCURONIUM BROMIDE 10 MG/ML (PF) SYRINGE: 150.0000 mg | PREFILLED_SYRINGE | Freq: Once | INTRAVENOUS | Status: AC

## 2023-10-31 MED ORDER — FENTANYL CITRATE PF 50 MCG/ML IJ SOSY
200.0000 ug | PREFILLED_SYRINGE | Freq: Once | INTRAMUSCULAR | Status: AC
  Administered 2023-10-31: 200 ug via INTRAVENOUS

## 2023-10-31 MED ORDER — FUROSEMIDE 10 MG/ML IJ SOLN
40.0000 mg | Freq: Once | INTRAMUSCULAR | Status: AC
  Administered 2023-10-31: 40 mg via INTRAVENOUS
  Filled 2023-10-31: qty 4

## 2023-10-31 MED ORDER — HEPARIN SODIUM (PORCINE) 5000 UNIT/ML IJ SOLN
5000.0000 [IU] | Freq: Three times a day (TID) | INTRAMUSCULAR | Status: DC
  Administered 2023-10-31 – 2023-11-06 (×19): 5000 [IU] via SUBCUTANEOUS
  Filled 2023-10-31 (×19): qty 1

## 2023-10-31 MED ORDER — METHYLPREDNISOLONE SODIUM SUCC 40 MG IJ SOLR
40.0000 mg | Freq: Two times a day (BID) | INTRAMUSCULAR | Status: DC
  Administered 2023-10-31 – 2023-11-03 (×6): 40 mg via INTRAVENOUS
  Filled 2023-10-31 (×6): qty 1

## 2023-10-31 MED ORDER — IOHEXOL 350 MG/ML SOLN
100.0000 mL | Freq: Once | INTRAVENOUS | Status: AC | PRN
  Administered 2023-10-31: 100 mL via INTRAVENOUS

## 2023-10-31 MED ORDER — METRONIDAZOLE 500 MG/100ML IV SOLN
500.0000 mg | Freq: Once | INTRAVENOUS | Status: AC
  Administered 2023-10-31: 500 mg via INTRAVENOUS
  Filled 2023-10-31: qty 100

## 2023-10-31 MED ORDER — SODIUM CHLORIDE 0.9 % IV SOLN: 2.0000 g | INTRAVENOUS | Status: DC

## 2023-10-31 MED ORDER — NOREPINEPHRINE 4 MG/250ML-% IV SOLN
0.0000 ug/min | INTRAVENOUS | Status: DC
  Administered 2023-10-31: 10 ug/min via INTRAVENOUS
  Administered 2023-10-31: 1 ug/min via INTRAVENOUS
  Administered 2023-11-01: 9 ug/min via INTRAVENOUS
  Filled 2023-10-31 (×4): qty 250

## 2023-10-31 MED ORDER — SODIUM CHLORIDE 0.9 % IV SOLN
2.0000 g | INTRAVENOUS | Status: DC
  Administered 2023-10-31 – 2023-11-02 (×3): 2 g via INTRAVENOUS
  Filled 2023-10-31 (×4): qty 20

## 2023-10-31 MED ORDER — LACTATED RINGERS IV BOLUS (SEPSIS)
3000.0000 mL | Freq: Once | INTRAVENOUS | Status: AC
  Administered 2023-10-31: 3000 mL via INTRAVENOUS

## 2023-10-31 MED ORDER — SUCCINYLCHOLINE CHLORIDE 200 MG/10ML IV SOSY: 150.0000 mg | PREFILLED_SYRINGE | Freq: Once | INTRAVENOUS | Status: AC

## 2023-10-31 MED ORDER — ORAL CARE MOUTH RINSE
15.0000 mL | OROMUCOSAL | Status: DC
  Administered 2023-10-31 – 2023-11-05 (×55): 15 mL via OROMUCOSAL

## 2023-10-31 MED ORDER — SODIUM CHLORIDE 0.9 % IV SOLN
2.0000 g | Freq: Once | INTRAVENOUS | Status: AC
  Administered 2023-10-31: 2 g via INTRAVENOUS
  Filled 2023-10-31 (×2): qty 12.5

## 2023-10-31 MED ORDER — ORAL CARE MOUTH RINSE: 15.0000 mL | OROMUCOSAL | Status: DC | PRN

## 2023-10-31 MED ORDER — VANCOMYCIN HCL IN DEXTROSE 1-5 GM/200ML-% IV SOLN
1000.0000 mg | Freq: Once | INTRAVENOUS | Status: AC
  Administered 2023-10-31: 1000 mg via INTRAVENOUS
  Filled 2023-10-31: qty 200

## 2023-10-31 MED ORDER — PROPOFOL 1000 MG/100ML IV EMUL
5.0000 ug/kg/min | INTRAVENOUS | Status: DC
  Filled 2023-10-31: qty 100

## 2023-10-31 MED ORDER — ROCURONIUM BROMIDE 10 MG/ML (PF) SYRINGE
PREFILLED_SYRINGE | INTRAVENOUS | Status: AC
  Administered 2023-10-31: 150 mg via INTRAVENOUS
  Filled 2023-10-31: qty 10

## 2023-10-31 MED ORDER — POLYETHYLENE GLYCOL 3350 17 G PO PACK: 17.0000 g | PACK | Freq: Every day | ORAL | Status: DC | PRN

## 2023-10-31 MED ORDER — CHLORHEXIDINE GLUCONATE CLOTH 2 % EX PADS
6.0000 | MEDICATED_PAD | Freq: Every day | CUTANEOUS | Status: DC
  Administered 2023-10-31 – 2023-11-12 (×12): 6 via TOPICAL

## 2023-10-31 MED ORDER — METHYLPREDNISOLONE SODIUM SUCC 40 MG IJ SOLR
40.0000 mg | Freq: Every day | INTRAMUSCULAR | Status: DC
  Administered 2023-10-31: 40 mg via INTRAVENOUS
  Filled 2023-10-31: qty 1

## 2023-10-31 MED ORDER — FENTANYL 2500MCG IN NS 250ML (10MCG/ML) PREMIX INFUSION
0.0000 ug/h | INTRAVENOUS | Status: DC
  Filled 2023-10-31: qty 250

## 2023-10-31 MED ORDER — DEXMEDETOMIDINE HCL IN NACL 400 MCG/100ML IV SOLN
0.0000 ug/kg/h | INTRAVENOUS | Status: DC
  Administered 2023-10-31 – 2023-11-01 (×8): 0.8 ug/kg/h via INTRAVENOUS
  Administered 2023-11-01 (×3): 1.2 ug/kg/h via INTRAVENOUS
  Administered 2023-11-01: 0.8 ug/kg/h via INTRAVENOUS
  Administered 2023-11-02 – 2023-11-03 (×24): 1.2 ug/kg/h via INTRAVENOUS
  Administered 2023-11-04: 1 ug/kg/h via INTRAVENOUS
  Administered 2023-11-04 (×5): 1.2 ug/kg/h via INTRAVENOUS
  Administered 2023-11-04: 0.7 ug/kg/h via INTRAVENOUS
  Administered 2023-11-04 (×2): 1.2 ug/kg/h via INTRAVENOUS
  Filled 2023-10-31 (×49): qty 100

## 2023-10-31 MED ORDER — PERFLUTREN LIPID MICROSPHERE
1.0000 mL | INTRAVENOUS | Status: AC | PRN
  Administered 2023-10-31: 2 mL via INTRAVENOUS

## 2023-10-31 MED ORDER — SODIUM CHLORIDE 0.9 % IV SOLN
1.0000 g | INTRAVENOUS | Status: DC
  Administered 2023-10-31: 1 g via INTRAVENOUS
  Filled 2023-10-31: qty 10

## 2023-10-31 MED ORDER — FENTANYL 2500MCG IN NS 250ML (10MCG/ML) PREMIX INFUSION
INTRAVENOUS | Status: AC
  Administered 2023-10-31: 200 ug/h via INTRAVENOUS
  Filled 2023-10-31: qty 250

## 2023-10-31 MED ORDER — ETOMIDATE 2 MG/ML IV SOLN
INTRAVENOUS | Status: AC
  Filled 2023-10-31: qty 10

## 2023-10-31 MED ORDER — SODIUM CHLORIDE 0.9 % IV SOLN
500.0000 mg | INTRAVENOUS | Status: AC
  Administered 2023-10-31 – 2023-11-04 (×5): 500 mg via INTRAVENOUS
  Filled 2023-10-31 (×5): qty 5

## 2023-10-31 MED ORDER — DOCUSATE SODIUM 50 MG/5ML PO LIQD: 100.0000 mg | Freq: Two times a day (BID) | ORAL | Status: DC | PRN

## 2023-10-31 MED ORDER — SODIUM PHOSPHATES 45 MMOLE/15ML IV SOLN
15.0000 mmol | Freq: Once | INTRAVENOUS | Status: AC
  Administered 2023-10-31: 15 mmol via INTRAVENOUS
  Filled 2023-10-31: qty 5

## 2023-10-31 MED ORDER — IPRATROPIUM-ALBUTEROL 0.5-2.5 (3) MG/3ML IN SOLN
3.0000 mL | Freq: Four times a day (QID) | RESPIRATORY_TRACT | Status: DC
  Administered 2023-10-31 – 2023-11-08 (×34): 3 mL via RESPIRATORY_TRACT
  Filled 2023-10-31 (×34): qty 3

## 2023-10-31 MED ORDER — NOREPINEPHRINE 4 MG/250ML-% IV SOLN
INTRAVENOUS | Status: AC
  Administered 2023-10-31: 10 ug/min via INTRAVENOUS
  Filled 2023-10-31: qty 250

## 2023-10-31 MED ORDER — ETOMIDATE 2 MG/ML IV SOLN: 40.0000 mg | Freq: Once | INTRAVENOUS | Status: AC

## 2023-10-31 MED ORDER — MIDAZOLAM-SODIUM CHLORIDE 100-0.9 MG/100ML-% IV SOLN
2.0000 mg/h | INTRAVENOUS | Status: DC
  Administered 2023-10-31: 2 mg/h via INTRAVENOUS
  Filled 2023-10-31 (×2): qty 100

## 2023-10-31 MED ORDER — PROPOFOL 1000 MG/100ML IV EMUL
INTRAVENOUS | Status: AC
  Filled 2023-10-31: qty 100

## 2023-10-31 MED ORDER — ACETAMINOPHEN 10 MG/ML IV SOLN
1000.0000 mg | Freq: Once | INTRAVENOUS | Status: AC
  Administered 2023-10-31: 1000 mg via INTRAVENOUS
  Filled 2023-10-31: qty 100

## 2023-10-31 MED ORDER — IPRATROPIUM-ALBUTEROL 0.5-2.5 (3) MG/3ML IN SOLN: 3.0000 mL | Freq: Four times a day (QID) | RESPIRATORY_TRACT | Status: DC | PRN

## 2023-10-31 MED ORDER — ROCURONIUM BROMIDE 10 MG/ML (PF) SYRINGE
PREFILLED_SYRINGE | INTRAVENOUS | Status: AC
  Filled 2023-10-31: qty 10

## 2023-10-31 MED ORDER — SODIUM CHLORIDE 0.9 % IV SOLN
2.0000 g | INTRAVENOUS | Status: DC
  Filled 2023-10-31: qty 20

## 2023-10-31 MED ORDER — INSULIN ASPART 100 UNIT/ML IJ SOLN
0.0000 [IU] | INTRAMUSCULAR | Status: DC
  Administered 2023-10-31: 3 [IU] via SUBCUTANEOUS
  Administered 2023-11-01: 5 [IU] via SUBCUTANEOUS
  Filled 2023-10-31 (×2): qty 1

## 2023-10-31 MED ORDER — PANTOPRAZOLE SODIUM 40 MG IV SOLR
40.0000 mg | INTRAVENOUS | Status: DC
Start: 2023-10-31 — End: 2023-11-06
  Administered 2023-10-31 – 2023-11-06 (×7): 40 mg via INTRAVENOUS
  Filled 2023-10-31 (×7): qty 10

## 2023-10-31 MED ORDER — SODIUM CHLORIDE 0.9 % IV SOLN
250.0000 mL | INTRAVENOUS | Status: AC
Start: 2023-10-31 — End: 2023-11-01
  Administered 2023-10-31: 250 mL via INTRAVENOUS

## 2023-10-31 NOTE — ED Notes (Signed)
 Rt and DO at bedside adjusting ventilator settings

## 2023-10-31 NOTE — Progress Notes (Signed)
 Transported patient to ICU from the Emergency Department. No issues with transport.

## 2023-10-31 NOTE — Progress Notes (Signed)
 CODE SEPSIS - PHARMACY COMMUNICATION  **Broad Spectrum Antibiotics should be administered within 1 hour of Sepsis diagnosis**  Time Code Sepsis Called/Page Received: 4/8 @ 0108  Antibiotics Ordered: Vanc, cefepime , metronidazole  Time of 1st antibiotic administration: Cefepime 2 gm IV X 1 on 4/8 @ 0204  Additional action taken by pharmacy:   If necessary, Name of Provider/Nurse Contacted:     Monicka Cyran D ,PharmD Clinical Pharmacist  10/31/2023  2:10 AM

## 2023-10-31 NOTE — Progress Notes (Addendum)
 PHARMACY CONSULT NOTE - ELECTROLYTES  Pharmacy Consult for Electrolyte Monitoring and Replacement   Recent Labs: Weight: (!) 188.2 kg (414 lb 14.5 oz) CrCl cannot be calculated (Unknown ideal weight.). Potassium (mmol/L)  Date Value  10/31/2023 4.4   Calcium (mg/dL)  Date Value  16/04/9603 8.0 (L)   Albumin (g/dL)  Date Value  54/03/8118 3.7   Sodium (mmol/L)  Date Value  10/31/2023 130 (L)   Corrected Ca: 8.2 mg/dL        Ca 8.0  Alb 3.7  Assessment  Clifford Nichols is a 43 y.o. male presenting with SHOB. PMH significant for obesity, obstructive sleep apnea, chronic respiratory failure on 4 L of oxygen, DM, HTN. Pharmacy has been consulted to monitor and replace electrolytes.  Diet: NPO MIVF: none Pertinent medications: on Propofol drip, intubated  Goal of Therapy: Electrolytes WNL  Plan:  Phos 2.2,   Na 130,  K 4.4.   Will order Sodium phosphate 15 mmol IV x 1 Check BMP, Mg, Phos with AM labs  Thank you for allowing pharmacy to be a part of this patient's care.  Scotty Cyphers, PharmD Clinical Pharmacist 10/31/2023 7:53 AM

## 2023-10-31 NOTE — H&P (Signed)
 NAME:  Clifford Nichols, MRN:  [id number], DOB:  1980/08/08, LOS: 0 ADMISSION DATE:  10/30/2023, CONSULTATION DATE:  10/31/23 REFERRING MD:  Verneda Golder  CHIEF COMPLAINT:  shortness of breath    HPI  43 y.o with significant PMH of  severe OSA, chronic respiraotry failure on 4L, extreme obesity with Alveolar hypoventilation, DM and HTN who presented to the ED with chief complaints of progressive shortness of breath.  Per ED reports, EMS was called for worsening respiratory distress. On EMS arrival, patient was found with oxygen saturation in the 80's on his normal 4L. He was placed on NRB and then transitioned to CPAP. He was treated with Duonebs and IV solumedrol enroute, shortly after he became unresponsive.   ED Course: Initial vital signs showed HR of 96 beats/minute, BP 136/62mm Hg, the RR 28breaths/minute, and the oxygen saturation 83% on CPAP and a temperature of 101.21F (38.4C). Patient was lethargic, moaned intermittently, and responded with one-word answers; symmetric movement in the arms and legs was observed. Due to worsening mental status on CPAP he was intubated for airway protection.  Pertinent Labs/Diagnostics Findings: Na+/ K+: 136/4.5 Glucose: 230  WBC: 15.3K/L with bands or neutrophil predominance    PCT: 0.27 COVID PCR: Negative,  troponin: 95  BNP: 243 ABG: pO2 59; pCO2 83; pH 7.23;  HCO3 34.8, %O2 Sat 86.2.  CXR>  CTA Chest> see results below Patient given 30 cc/kg of fluids and started on broad-spectrum antibiotics Vanco cefepime and Flagyl for sepsis with septic shock. Patient remained hypotensive despite IVF boluses therefore was started on Levophed. PCCM consulted.   Past Medical History  severe OSA, chronic respiraotry failure on 4L, extreme obesity with Alveolar hypoventilation, DM and HTN  Significant Hospital Events   4/8: Admit to ICU with acute on chronic hypoxic hypercapnic respiratory failure in the setting of pneumonia and alveolar hypoventilation    Consults:  None  Procedures:  4/8: Ablation  Significant Diagnostic Tests:  4/8: Chest Xray> IMPRESSION: Low lung volumes with mild right basilar atelectasis and/or infiltrate.  4/8: CTA Chest, abdomen and pelvis> IMPRESSION: 1. Limited study without evidence of pulmonary embolism. 2. Marked severity areas of bilateral lower lobe scarring, atelectasis and/or infiltrate. 3. Mild right middle lobe atelectatic changes. 4. Properly positioned endotracheal and nasogastric tubes.  Interim History / Subjective:      Micro Data:  4/8: SARS-CoV-2 PCR> negative 4/8: Influenza PCR> negative 4/8: Blood culture x2> 4/8: MRSA PCR>>  4/8: Strep pneumo urinary antigen> 4/8: Legionella urinary antigen>  Antimicrobials:  Vancomycin 4/8 x 1 Cefepime 4/8 x1 Azithromycin 4 /8> Ceftriaxon 4/8 > Metronidazole 4/8 x1  OBJECTIVE  Blood pressure 115/79, pulse 95, temperature 98.5 F (36.9 C), resp. rate (!) 28, weight (!) 188.2 kg, SpO2 (!) 84%.    Vent Mode: PRVC FiO2 (%):  [100 %] 100 % Set Rate:  [18 bmp-28 bmp] 28 bmp Vt Set:  [520 mL] 520 mL PEEP:  [10 cmH20-14 cmH20] 12 cmH20  No intake or output data in the 24 hours ending 10/31/23 0646 Filed Weights   10/31/23 0229  Weight: (!) 188.2 kg     Physical Examination  GENERAL: 43 year-old critically ill patient lying in the bed intubated and sedated EYES: PEERLA. No scleral icterus. Extraocular muscles intact.  HEENT: Head atraumatic, normocephalic. Oropharynx and nasopharynx clear.  NECK:  No JVD, supple  LUNGS: Decreased breath sounds bilaterally.   CARDIOVASCULAR: S1, S2 normal. No murmurs, rubs, or gallops.  ABDOMEN: Soft, NTND EXTREMITIES: No swelling or  erythema.  Capillary refill < 3 seconds in all extremities. Pulses palpable distally. NEUROLOGIC: The patient is intubated and sedated. No focal neurological deficit appreciated. Cranial nerves are intact.  SKIN: No obvious rash, lesion, or ulcer. Warm to  touch Labs/imaging that I havepersonally reviewed  (right click and "Reselect all SmartList Selections" daily)     Labs   CBC: Recent Labs  Lab 10/30/23 2348  WBC 15.3*  NEUTROABS 10.1*  HGB 15.0  HCT 54.2*  MCV 95.1  PLT 195    Basic Metabolic Panel: Recent Labs  Lab 10/30/23 2348  NA 136  K 4.5  CL 95*  CO2 33*  GLUCOSE 230*  BUN 19  CREATININE 1.09  CALCIUM 8.2*   GFR: CrCl cannot be calculated (Unknown ideal weight.). Recent Labs  Lab 10/30/23 2348 10/31/23 0136 10/31/23 0447  PROCALCITON 0.27  --   --   WBC 15.3*  --   --   LATICACIDVEN  --  1.3 1.8    Liver Function Tests: Recent Labs  Lab 10/30/23 2348  AST 37  ALT 46*  ALKPHOS 61  BILITOT 0.9  PROT 9.0*  ALBUMIN 3.7   No results for input(s): "LIPASE", "AMYLASE" in the last 168 hours. No results for input(s): "AMMONIA" in the last 168 hours.  ABG    Component Value Date/Time   PHART 7.23 (L) 10/31/2023 0033   PCO2ART 83 (HH) 10/31/2023 0033   PO2ART 59 (L) 10/31/2023 0033   HCO3 34.8 (H) 10/31/2023 0033   O2SAT 86.2 10/31/2023 0033     Coagulation Profile: No results for input(s): "INR", "PROTIME" in the last 168 hours.  Cardiac Enzymes: No results for input(s): "CKTOTAL", "CKMB", "CKMBINDEX", "TROPONINI" in the last 168 hours.  HbA1C: No results found for: "HGBA1C"  CBG: Recent Labs  Lab 10/31/23 0002  GLUCAP 239*    Review of Systems:   Unable to be obtained secondary to the patient's intubated and sedated status.   Past Medical History  He,  has a past medical history of Obesity, Obesity hypoventilation syndrome (HCC), and OSA (obstructive sleep apnea).   Surgical History   History reviewed. No pertinent surgical history.   Social History      Family History   His family history is not on file.   Allergies Not on File   Home Medications  Prior to Admission medications   Medication Sig Start Date End Date Taking? Authorizing Provider  amLODipine  (NORVASC) 10 MG tablet Take 10 mg by mouth daily. 08/15/23  Yes [provider]  lisinopril (ZESTRIL) 20 MG tablet Take 20 mg by mouth daily. 08/15/23  Yes [provider]  metFORMIN (GLUCOPHAGE) 1000 MG tablet Take 1,000 mg by mouth 2 (two) times daily. 08/17/23  Yes [provider]  Scheduled Meds:  heparin  5,000 Units Subcutaneous Q8H   ipratropium-albuterol  3 mL Nebulization Q6H   Continuous Infusions:  azithromycin     cefTRIAXone (ROCEPHIN)  IV     fentaNYL infusion INTRAVENOUS 250 mcg/hr (10/31/23 2956)   midazolam 2 mg/hr (10/31/23 0235)   norepinephrine (LEVOPHED) Adult infusion Stopped (10/31/23 0504)   propofol (DIPRIVAN) infusion Stopped (10/31/23 0219)   PRN Meds:.docusate, ipratropium-albuterol, polyethylene glycol   Active Hospital Problem list   See systems below  Assessment & Plan:  #Acute on Chronic Hypoxic and Hypercapnic Respiratory Failure #CAP   Hx of severe OSA, Alveolar hypoventilation and chronic respiratory failure on chronic home oxygen at 4L now with acute hypoxic hypercapnic respiratory failure failed BiPAP  requiring mechanical ventilation.  CTA chest negative for PE but consistent with an infectious process.   -con't full mechanical support 6-8cc/kg/Vt  -titrate FiO2, PEEP to maintain O2 sat >90%  -Lung protective ventilation  -PRN Chest X-ray & ABG -PRN and scheduled bronchodilators -Start systemic steroid -SAT/SBT when appropriate  -prn fentanyl, prop for RASS -1    #Sepsis with septic shock due to suspected pneumonia Initial interventions/workup included: 30cc/kg NS/LR & Cefepime/ Vancomycin/ Flagyl meets SIRS criteria: Heart Rate 96 beats/minute, Respiratory Rate 28 breaths/minute,Temperature 101.2, -F/u cultures, trend lactic/ PCT -Monitor WBC/ fever curve -Obtain MRSA nasal swab, RVP, legionella, strep urine Ag -IV antibiotics Ceftriaxone AND Azithromycin -Gentle IVF hydration as needed -Pressors PRN for MAP  goal >65 -Strict I/O's   #Mildly Elevated Troponin, suspect demand ischemia PMHx: HLD, HTN -Trend HS Troponin until peaked -Hold amlodipine and Lisinopril -Obtain 2D echo   #Sedation needs in setting of mechanical ventilation #Acute Metabolic Encephalopathy -CT head if no improvement -Maintain a RASS goal of 0 to -1 -Propofol and Fentanyl to maintain RASS goal -Avoid sedating medications as able -Daily wake up assessment  #T2 Diabetes mellitus -Check Hemoglobin A1c -CBGs -Sliding scale insulin -Follow ICU hyper/hypoglycemia protocol -Hold home Meds   Best practice:  Diet:  NPO Pain/Anxiety/Delirium protocol (if indicated): Yes (RASS goal -1) VAP protocol (if indicated): Yes DVT prophylaxis: Subcutaneous Heparin GI prophylaxis: PPI Glucose control:  SSI Yes Central venous access:  N/A Arterial line:  N/A Foley:  Yes, and it is still needed Mobility:  bed rest  PT consulted: N/A Last date of multidisciplinary goals of care discussion [updated wife at the bedside] Code Status:  full code Disposition: ICU   = Goals of Care = Code Status Order: FULL  Primary Emergency ContactDorrene Gaucher, Home Phone: [phone] Wishes to pursue full aggressive treatment and intervention options, including CPR and intubation,   Critical care time: 45 minutes        Alonza Arthurs DNP, CCRN, FNP-C, AGACNP-BC Acute Care & Family Nurse Practitioner [facility] PCCM on call pager [phone]

## 2023-10-31 NOTE — Sepsis Progress Note (Signed)
 Elink monitoring for the code sepsis protocol.

## 2023-10-31 NOTE — Progress Notes (Signed)
 Pt transported to CT on the vent and returned to ED 8 without incident. Pt remains on the vent and is tol well at this time.

## 2023-10-31 NOTE — Plan of Care (Signed)

## 2023-10-31 NOTE — Plan of Care (Signed)
 Problem: Education: Goal: Knowledge of General Education information will improve Description: Including pain rating scale, medication(s)/side effects and non-pharmacologic comfort measures 10/31/2023 2320 by Vince Grebe, RN Outcome: Progressing 10/31/2023 1948 by Vince Grebe, RN Outcome: Progressing   Problem: Health Behavior/Discharge Planning: Goal: Ability to manage health-related needs will improve 10/31/2023 2320 by Vince Grebe, RN Outcome: Progressing 10/31/2023 1948 by Vince Grebe, RN Outcome: Progressing   Problem: Clinical Measurements: Goal: Ability to maintain clinical measurements within normal limits will improve 10/31/2023 2320 by Vince Grebe, RN Outcome: Progressing 10/31/2023 1948 by Vince Grebe, RN Outcome: Progressing Goal: Will remain free from infection 10/31/2023 2320 by Vince Grebe, RN Outcome: Progressing 10/31/2023 1948 by Vince Grebe, RN Outcome: Progressing Goal: Diagnostic test results will improve 10/31/2023 2320 by Vince Grebe, RN Outcome: Progressing 10/31/2023 1948 by Vince Grebe, RN Outcome: Progressing Goal: Respiratory complications will improve 10/31/2023 2320 by Vince Grebe, RN Outcome: Progressing 10/31/2023 1948 by Vince Grebe, RN Outcome: Progressing Goal: Cardiovascular complication will be avoided 10/31/2023 2320 by Vince Grebe, RN Outcome: Progressing 10/31/2023 1948 by Vince Grebe, RN Outcome: Progressing   Problem: Activity: Goal: Risk for activity intolerance will decrease 10/31/2023 2320 by Vince Grebe, RN Outcome: Progressing 10/31/2023 1948 by Vince Grebe, RN Outcome: Progressing   Problem: Nutrition: Goal: Adequate nutrition will be maintained 10/31/2023 2320 by Vince Grebe, RN Outcome: Progressing 10/31/2023 1948 by Vince Grebe, RN Outcome: Progressing   Problem: Coping: Goal: Level of anxiety will decrease 10/31/2023 2320 by Vince Grebe, RN Outcome:  Progressing 10/31/2023 1948 by Vince Grebe, RN Outcome: Progressing   Problem: Elimination: Goal: Will not experience complications related to bowel motility 10/31/2023 2320 by Vince Grebe, RN Outcome: Progressing 10/31/2023 1948 by Vince Grebe, RN Outcome: Progressing Goal: Will not experience complications related to urinary retention 10/31/2023 2320 by Vince Grebe, RN Outcome: Progressing 10/31/2023 1948 by Vince Grebe, RN Outcome: Progressing   Problem: Pain Managment: Goal: General experience of comfort will improve and/or be controlled 10/31/2023 2320 by Vince Grebe, RN Outcome: Progressing 10/31/2023 1948 by Vince Grebe, RN Outcome: Progressing   Problem: Safety: Goal: Ability to remain free from injury will improve 10/31/2023 2320 by Vince Grebe, RN Outcome: Progressing 10/31/2023 1948 by Vince Grebe, RN Outcome: Progressing   Problem: Skin Integrity: Goal: Risk for impaired skin integrity will decrease 10/31/2023 2320 by Vince Grebe, RN Outcome: Progressing 10/31/2023 1948 by Vince Grebe, RN Outcome: Progressing   Problem: Education: Goal: Ability to describe self-care measures that may prevent or decrease complications (Diabetes Survival Skills Education) will improve 10/31/2023 2320 by Vince Grebe, RN Outcome: Progressing 10/31/2023 1948 by Vince Grebe, RN Outcome: Progressing Goal: Individualized Educational Video(s) 10/31/2023 2320 by Vince Grebe, RN Outcome: Progressing 10/31/2023 1948 by Vince Grebe, RN Outcome: Progressing   Problem: Coping: Goal: Ability to adjust to condition or change in health will improve 10/31/2023 2320 by Vince Grebe, RN Outcome: Progressing 10/31/2023 1948 by Vince Grebe, RN Outcome: Progressing   Problem: Fluid Volume: Goal: Ability to maintain a balanced intake and output will improve 10/31/2023 2320 by Vince Grebe, RN Outcome: Progressing 10/31/2023 1948 by Vince Grebe,  RN Outcome: Progressing   Problem: Health Behavior/Discharge Planning: Goal: Ability to identify and utilize available resources and services will improve 10/31/2023 2320 by Vince Grebe, RN Outcome: Progressing 10/31/2023 1948 by  Vince Grebe, RN Outcome: Progressing Goal: Ability to manage health-related needs will improve 10/31/2023 2320 by Vince Grebe, RN Outcome: Progressing 10/31/2023 1948 by Vince Grebe, RN Outcome: Progressing   Problem: Metabolic: Goal: Ability to maintain appropriate glucose levels will improve 10/31/2023 2320 by Vince Grebe, RN Outcome: Progressing 10/31/2023 1948 by Vince Grebe, RN Outcome: Progressing   Problem: Nutritional: Goal: Maintenance of adequate nutrition will improve 10/31/2023 2320 by Vince Grebe, RN Outcome: Progressing 10/31/2023 1948 by Vince Grebe, RN Outcome: Progressing Goal: Progress toward achieving an optimal weight will improve 10/31/2023 2320 by Vince Grebe, RN Outcome: Progressing 10/31/2023 1948 by Vince Grebe, RN Outcome: Progressing   Problem: Skin Integrity: Goal: Risk for impaired skin integrity will decrease 10/31/2023 2320 by Vince Grebe, RN Outcome: Progressing 10/31/2023 1948 by Vince Grebe, RN Outcome: Progressing   Problem: Tissue Perfusion: Goal: Adequacy of tissue perfusion will improve 10/31/2023 2320 by Vince Grebe, RN Outcome: Progressing 10/31/2023 1948 by Vince Grebe, RN Outcome: Progressing   Problem: Activity: Goal: Ability to tolerate increased activity will improve 10/31/2023 2320 by Vince Grebe, RN Outcome: Progressing 10/31/2023 1948 by Vince Grebe, RN Outcome: Progressing   Problem: Respiratory: Goal: Ability to maintain a clear airway and adequate ventilation will improve 10/31/2023 2320 by Vince Grebe, RN Outcome: Progressing 10/31/2023 1948 by Vince Grebe, RN Outcome: Progressing   Problem: Role Relationship: Goal: Method of communication  will improve 10/31/2023 2320 by Vince Grebe, RN Outcome: Progressing 10/31/2023 1948 by Vince Grebe, RN Outcome: Progressing

## 2023-10-31 NOTE — Plan of Care (Signed)
  Problem: Education: Goal: Knowledge of General Education information will improve Description: Including pain rating scale, medication(s)/side effects and non-pharmacologic comfort measures Outcome: Not Progressing   Problem: Health Behavior/Discharge Planning: Goal: Ability to manage health-related needs will improve Outcome: Not Progressing   Problem: Clinical Measurements: Goal: Ability to maintain clinical measurements within normal limits will improve Outcome: Not Progressing Goal: Will remain free from infection Outcome: Not Progressing Goal: Diagnostic test results will improve Outcome: Not Progressing Goal: Respiratory complications will improve Outcome: Not Progressing Goal: Cardiovascular complication will be avoided Outcome: Not Progressing   Problem: Activity: Goal: Risk for activity intolerance will decrease Outcome: Not Progressing   Problem: Nutrition: Goal: Adequate nutrition will be maintained Outcome: Not Progressing   Problem: Coping: Goal: Level of anxiety will decrease Outcome: Not Progressing   Problem: Elimination: Goal: Will not experience complications related to bowel motility Outcome: Not Progressing Goal: Will not experience complications related to urinary retention Outcome: Not Progressing   Problem: Pain Managment: Goal: General experience of comfort will improve and/or be controlled Outcome: Not Progressing   Problem: Safety: Goal: Ability to remain free from injury will improve Outcome: Not Progressing   Problem: Skin Integrity: Goal: Risk for impaired skin integrity will decrease Outcome: Not Progressing   Problem: Education: Goal: Ability to describe self-care measures that may prevent or decrease complications (Diabetes Survival Skills Education) will improve Outcome: Not Progressing Goal: Individualized Educational Video(s) Outcome: Not Progressing   Problem: Coping: Goal: Ability to adjust to condition or change in  health will improve Outcome: Not Progressing   Problem: Fluid Volume: Goal: Ability to maintain a balanced intake and output will improve Outcome: Not Progressing   Problem: Health Behavior/Discharge Planning: Goal: Ability to identify and utilize available resources and services will improve Outcome: Not Progressing Goal: Ability to manage health-related needs will improve Outcome: Not Progressing   Problem: Metabolic: Goal: Ability to maintain appropriate glucose levels will improve Outcome: Not Progressing   Problem: Nutritional: Goal: Maintenance of adequate nutrition will improve Outcome: Not Progressing Goal: Progress toward achieving an optimal weight will improve Outcome: Not Progressing   Problem: Skin Integrity: Goal: Risk for impaired skin integrity will decrease Outcome: Not Progressing   Problem: Tissue Perfusion: Goal: Adequacy of tissue perfusion will improve Outcome: Not Progressing   Problem: Activity: Goal: Ability to tolerate increased activity will improve Outcome: Not Progressing   Problem: Respiratory: Goal: Ability to maintain a clear airway and adequate ventilation will improve Outcome: Not Progressing   Problem: Role Relationship: Goal: Method of communication will improve Outcome: Not Progressing

## 2023-10-31 NOTE — ED Notes (Addendum)
 Updated Pts wife at this time, Pt is able to follow commands and able to nod or shake head to questions. Pt is calm but slight diaphoretic. Wet cold cloth applied to head, temp is WNR, will continue to monitor

## 2023-10-31 NOTE — ED Notes (Signed)
 Pt transported to radiology with this RN, tech, and respiratory. Pt able to follow commands and gestures head yes or no when asked a questions. Awaiting ICU bed.

## 2023-10-31 NOTE — Progress Notes (Signed)
 PHARMACY - PHYSICIAN COMMUNICATION CRITICAL VALUE ALERT - BLOOD CULTURE IDENTIFICATION (BCID)  Clifford Nichols is an 43 y.o. male who presented to [facility] on 10/30/2023 with a chief complaint of progressive SOB.  Assessment:  4/8 Bcx 1/2 anaerobic only GPC, Staphylococcus epidermidis without resistance   Name of physician (or Provider) Contacted: Dr. Cleve Dale  Current antibiotics:  Ceftriaxone 1g Q24H Zithromax 500 mg Q24H  Changes to prescribed antibiotics recommended:  Increased Ceftriaxone to 2g Q24H Continued Zithromax 500 mg Q24H  Results for orders placed or performed during the hospital encounter of 10/30/23  Blood Culture ID Panel (Reflexed) (Collected: 10/31/2023  1:36 AM)  Result Value Ref Range   Enterococcus faecalis NOT DETECTED NOT DETECTED   Enterococcus Faecium NOT DETECTED NOT DETECTED   Listeria monocytogenes NOT DETECTED NOT DETECTED   Staphylococcus species DETECTED (A) NOT DETECTED   Staphylococcus aureus (BCID) NOT DETECTED NOT DETECTED   Staphylococcus epidermidis DETECTED (A) NOT DETECTED   Staphylococcus lugdunensis NOT DETECTED NOT DETECTED   Streptococcus species NOT DETECTED NOT DETECTED   Streptococcus agalactiae NOT DETECTED NOT DETECTED   Streptococcus pneumoniae NOT DETECTED NOT DETECTED   Streptococcus pyogenes NOT DETECTED NOT DETECTED   A.calcoaceticus-baumannii NOT DETECTED NOT DETECTED   Bacteroides fragilis NOT DETECTED NOT DETECTED   Enterobacterales NOT DETECTED NOT DETECTED   Enterobacter cloacae complex NOT DETECTED NOT DETECTED   Escherichia coli NOT DETECTED NOT DETECTED   Klebsiella aerogenes NOT DETECTED NOT DETECTED   Klebsiella oxytoca NOT DETECTED NOT DETECTED   Klebsiella pneumoniae NOT DETECTED NOT DETECTED   Proteus species NOT DETECTED NOT DETECTED   Salmonella species NOT DETECTED NOT DETECTED   Serratia marcescens NOT DETECTED NOT DETECTED   Haemophilus influenzae NOT DETECTED NOT DETECTED   Neisseria meningitidis NOT  DETECTED NOT DETECTED   Pseudomonas aeruginosa NOT DETECTED NOT DETECTED   Stenotrophomonas maltophilia NOT DETECTED NOT DETECTED   Candida albicans NOT DETECTED NOT DETECTED   Candida auris NOT DETECTED NOT DETECTED   Candida glabrata NOT DETECTED NOT DETECTED   Candida krusei NOT DETECTED NOT DETECTED   Candida parapsilosis NOT DETECTED NOT DETECTED   Candida tropicalis NOT DETECTED NOT DETECTED   Cryptococcus neoformans/gattii NOT DETECTED NOT DETECTED   Methicillin resistance mecA/C NOT DETECTED NOT DETECTED   Craven Do, PharmD Pharmacy Resident  10/31/2023 5:33 PM

## 2023-10-31 NOTE — ED Notes (Signed)
 Family at bedside. ICU NP at bedside discussing care plan.

## 2023-11-01 ENCOUNTER — Inpatient Hospital Stay

## 2023-11-01 DIAGNOSIS — J9601 Acute respiratory failure with hypoxia: Secondary | ICD-10-CM | POA: Diagnosis not present

## 2023-11-01 DIAGNOSIS — J9602 Acute respiratory failure with hypercapnia: Secondary | ICD-10-CM

## 2023-11-01 LAB — BLOOD GAS, ARTERIAL
Acid-Base Excess: 0.8 mmol/L (ref 0.0–2.0)
Acid-base deficit: 0.3 mmol/L (ref 0.0–2.0)
Acid-base deficit: 0.6 mmol/L (ref 0.0–2.0)
Acid-base deficit: 0.7 mmol/L (ref 0.0–2.0)
Bicarbonate: 26.6 mmol/L (ref 20.0–28.0)
Bicarbonate: 26.9 mmol/L (ref 20.0–28.0)
Bicarbonate: 27.3 mmol/L (ref 20.0–28.0)
Bicarbonate: 27.7 mmol/L (ref 20.0–28.0)
FIO2: 100 %
FIO2: 100 %
FIO2: 100 %
FIO2: 100 %
MECHVT: 540 mL
MECHVT: 540 mL
MECHVT: 540 mL
Mechanical Rate: 24
Mechanical Rate: 24
Mechanical Rate: 24
O2 Saturation: 79.8 %
O2 Saturation: 85.9 %
O2 Saturation: 87.2 %
O2 Saturation: 91.3 %
PEEP: 16 cmH2O
PEEP: 16 cmH2O
PEEP: 16 cmH2O
PEEP: 16 cmH2O
Patient temperature: 37
Patient temperature: 37
Patient temperature: 37
Patient temperature: 37
pCO2 arterial: 46 mmHg (ref 32–48)
pCO2 arterial: 56 mmHg — ABNORMAL HIGH (ref 32–48)
pCO2 arterial: 58 mmHg — ABNORMAL HIGH (ref 32–48)
pCO2 arterial: 59 mmHg — ABNORMAL HIGH (ref 32–48)
pH, Arterial: 7.28 — ABNORMAL LOW (ref 7.35–7.45)
pH, Arterial: 7.28 — ABNORMAL LOW (ref 7.35–7.45)
pH, Arterial: 7.29 — ABNORMAL LOW (ref 7.35–7.45)
pH, Arterial: 7.37 (ref 7.35–7.45)
pO2, Arterial: 51 mmHg — ABNORMAL LOW (ref 83–108)
pO2, Arterial: 58 mmHg — ABNORMAL LOW (ref 83–108)
pO2, Arterial: 59 mmHg — ABNORMAL LOW (ref 83–108)
pO2, Arterial: 59 mmHg — ABNORMAL LOW (ref 83–108)

## 2023-11-01 LAB — GLUCOSE, CAPILLARY
Glucose-Capillary: 193 mg/dL — ABNORMAL HIGH (ref 70–99)
Glucose-Capillary: 211 mg/dL — ABNORMAL HIGH (ref 70–99)
Glucose-Capillary: 223 mg/dL — ABNORMAL HIGH (ref 70–99)
Glucose-Capillary: 265 mg/dL — ABNORMAL HIGH (ref 70–99)
Glucose-Capillary: 270 mg/dL — ABNORMAL HIGH (ref 70–99)
Glucose-Capillary: 277 mg/dL — ABNORMAL HIGH (ref 70–99)
Glucose-Capillary: 330 mg/dL — ABNORMAL HIGH (ref 70–99)
Glucose-Capillary: 334 mg/dL — ABNORMAL HIGH (ref 70–99)

## 2023-11-01 LAB — BLOOD CULTURE ID PANEL (REFLEXED) - BCID2

## 2023-11-01 LAB — CBC
HCT: 49.7 % (ref 39.0–52.0)
Hemoglobin: 14.1 g/dL (ref 13.0–17.0)
MCH: 26.2 pg (ref 26.0–34.0)
MCHC: 28.4 g/dL — ABNORMAL LOW (ref 30.0–36.0)
MCV: 92.2 fL (ref 80.0–100.0)
Platelets: 179 10*3/uL (ref 150–400)
RBC: 5.39 MIL/uL (ref 4.22–5.81)
RDW: 15.2 % (ref 11.5–15.5)
WBC: 16.7 10*3/uL — ABNORMAL HIGH (ref 4.0–10.5)
nRBC: 1.3 % — ABNORMAL HIGH (ref 0.0–0.2)

## 2023-11-01 LAB — BASIC METABOLIC PANEL WITH GFR
Anion gap: 8 (ref 5–15)
BUN: 40 mg/dL — ABNORMAL HIGH (ref 6–20)
CO2: 27 mmol/L (ref 22–32)
Calcium: 7.8 mg/dL — ABNORMAL LOW (ref 8.9–10.3)
Chloride: 99 mmol/L (ref 98–111)
Creatinine, Ser: 1.87 mg/dL — ABNORMAL HIGH (ref 0.61–1.24)
GFR, Estimated: 46 mL/min — ABNORMAL LOW (ref >60)
Glucose, Bld: 287 mg/dL — ABNORMAL HIGH (ref 70–99)
Potassium: 4.7 mmol/L (ref 3.5–5.1)
Sodium: 134 mmol/L — ABNORMAL LOW (ref 135–145)

## 2023-11-01 LAB — MRSA NEXT GEN BY PCR, NASAL: MRSA by PCR Next Gen: NOT DETECTED

## 2023-11-01 LAB — RENAL FUNCTION PANEL
Albumin: 2.9 g/dL — ABNORMAL LOW (ref 3.5–5.0)
Anion gap: 10 (ref 5–15)
BUN: 38 mg/dL — ABNORMAL HIGH (ref 6–20)
CO2: 23 mmol/L (ref 22–32)
Calcium: 7.8 mg/dL — ABNORMAL LOW (ref 8.9–10.3)
Chloride: 99 mmol/L (ref 98–111)
Creatinine, Ser: 2.35 mg/dL — ABNORMAL HIGH (ref 0.61–1.24)
GFR, Estimated: 35 mL/min — ABNORMAL LOW (ref >60)
Glucose, Bld: 365 mg/dL — ABNORMAL HIGH (ref 70–99)
Phosphorus: 5 mg/dL — ABNORMAL HIGH (ref 2.5–4.6)
Potassium: 5.2 mmol/L — ABNORMAL HIGH (ref 3.5–5.1)
Sodium: 132 mmol/L — ABNORMAL LOW (ref 135–145)

## 2023-11-01 LAB — LEGIONELLA PNEUMOPHILA SEROGP 1 UR AG: L. pneumophila Serogp 1 Ur Ag: NEGATIVE

## 2023-11-01 LAB — HEMOGLOBIN A1C
Hgb A1c MFr Bld: 6.8 % — ABNORMAL HIGH (ref 4.8–5.6)
Mean Plasma Glucose: 148 mg/dL

## 2023-11-01 LAB — MAGNESIUM: Magnesium: 2.4 mg/dL (ref 1.7–2.4)

## 2023-11-01 MED ORDER — FENTANYL 2500MCG IN NS 250ML (10MCG/ML) PREMIX INFUSION: 50.0000 ug/h | INTRAVENOUS | Status: DC

## 2023-11-01 MED ORDER — FENTANYL CITRATE PF 50 MCG/ML IJ SOSY
PREFILLED_SYRINGE | INTRAMUSCULAR | Status: AC
  Administered 2023-11-01: 50 ug via INTRAVENOUS
  Filled 2023-11-01: qty 1

## 2023-11-01 MED ORDER — FENTANYL CITRATE PF 50 MCG/ML IJ SOSY: 50.0000 ug | PREFILLED_SYRINGE | Freq: Once | INTRAMUSCULAR | Status: AC

## 2023-11-01 MED ORDER — SODIUM CHLORIDE 0.9% FLUSH: 10.0000 mL | INTRAVENOUS | Status: DC | PRN

## 2023-11-01 MED ORDER — MIDAZOLAM HCL 2 MG/2ML IJ SOLN
INTRAMUSCULAR | Status: AC
  Filled 2023-11-01: qty 2

## 2023-11-01 MED ORDER — INSULIN GLARGINE-YFGN 100 UNIT/ML ~~LOC~~ SOLN
14.0000 [IU] | Freq: Two times a day (BID) | SUBCUTANEOUS | Status: DC
  Administered 2023-11-01 (×2): 14 [IU] via SUBCUTANEOUS
  Filled 2023-11-01 (×3): qty 0.14

## 2023-11-01 MED ORDER — INSULIN ASPART 100 UNIT/ML IJ SOLN
0.0000 [IU] | INTRAMUSCULAR | Status: DC
  Administered 2023-11-01: 11 [IU] via SUBCUTANEOUS
  Filled 2023-11-01: qty 1

## 2023-11-01 MED ORDER — MIDAZOLAM HCL 2 MG/2ML IJ SOLN: 1.0000 mg | INTRAMUSCULAR | Status: DC | PRN

## 2023-11-01 MED ORDER — POLYETHYLENE GLYCOL 3350 17 G PO PACK
17.0000 g | PACK | Freq: Every day | ORAL | Status: DC
  Administered 2023-11-01 – 2023-11-04 (×4): 17 g
  Filled 2023-11-01 (×4): qty 1

## 2023-11-01 MED ORDER — VITAL HIGH PROTEIN PO LIQD
1000.0000 mL | ORAL | Status: DC
  Administered 2023-11-01 – 2023-11-04 (×6): 1000 mL

## 2023-11-01 MED ORDER — ACETAMINOPHEN 325 MG PO TABS
650.0000 mg | ORAL_TABLET | Freq: Four times a day (QID) | ORAL | Status: DC | PRN
  Administered 2023-11-01 – 2023-11-02 (×4): 650 mg via NASOGASTRIC
  Filled 2023-11-01 (×5): qty 2

## 2023-11-01 MED ORDER — INSULIN ASPART 100 UNIT/ML IJ SOLN
0.0000 [IU] | INTRAMUSCULAR | Status: DC
  Administered 2023-11-01: 4 [IU] via SUBCUTANEOUS
  Administered 2023-11-01: 15 [IU] via SUBCUTANEOUS
  Administered 2023-11-01: 11 [IU] via SUBCUTANEOUS
  Administered 2023-11-01 – 2023-11-02 (×8): 7 [IU] via SUBCUTANEOUS
  Administered 2023-11-03: 11 [IU] via SUBCUTANEOUS
  Administered 2023-11-03: 7 [IU] via SUBCUTANEOUS
  Administered 2023-11-03: 4 [IU] via SUBCUTANEOUS
  Administered 2023-11-03 (×2): 7 [IU] via SUBCUTANEOUS
  Administered 2023-11-03: 11 [IU] via SUBCUTANEOUS
  Administered 2023-11-04: 3 [IU] via SUBCUTANEOUS
  Administered 2023-11-04 (×4): 4 [IU] via SUBCUTANEOUS
  Administered 2023-11-05: 3 [IU] via SUBCUTANEOUS
  Administered 2023-11-05: 4 [IU] via SUBCUTANEOUS
  Administered 2023-11-05: 3 [IU] via SUBCUTANEOUS
  Administered 2023-11-06 (×2): 7 [IU] via SUBCUTANEOUS
  Administered 2023-11-06 – 2023-11-07 (×2): 4 [IU] via SUBCUTANEOUS
  Filled 2023-11-01 (×27): qty 1

## 2023-11-01 MED ORDER — FENTANYL CITRATE PF 50 MCG/ML IJ SOSY
50.0000 ug | PREFILLED_SYRINGE | INTRAMUSCULAR | Status: DC | PRN
  Administered 2023-11-03: 50 ug via INTRAVENOUS
  Filled 2023-11-01: qty 1

## 2023-11-01 MED ORDER — FENTANYL BOLUS VIA INFUSION: 50.0000 ug | INTRAVENOUS | Status: DC | PRN

## 2023-11-01 MED ORDER — SODIUM CHLORIDE 0.9% FLUSH
10.0000 mL | Freq: Two times a day (BID) | INTRAVENOUS | Status: DC
  Administered 2023-11-01 – 2023-11-02 (×3): 10 mL
  Administered 2023-11-02: 20 mL
  Administered 2023-11-03: 10 mL
  Administered 2023-11-03 – 2023-11-04 (×2): 20 mL
  Administered 2023-11-04: 30 mL
  Administered 2023-11-05: 10 mL
  Administered 2023-11-05: 30 mL
  Administered 2023-11-06 – 2023-11-07 (×4): 10 mL

## 2023-11-01 MED ORDER — FREE WATER
30.0000 mL | Status: DC
  Administered 2023-11-01 – 2023-11-04 (×17): 30 mL

## 2023-11-01 MED ORDER — INSULIN ASPART 100 UNIT/ML IJ SOLN
5.0000 [IU] | Freq: Once | INTRAMUSCULAR | Status: AC
  Administered 2023-11-01: 5 [IU] via SUBCUTANEOUS
  Filled 2023-11-01: qty 1

## 2023-11-01 MED ORDER — SODIUM CHLORIDE 0.9 % IV BOLUS
250.0000 mL | Freq: Once | INTRAVENOUS | Status: AC
  Administered 2023-11-01: 250 mL via INTRAVENOUS

## 2023-11-01 MED ORDER — DOCUSATE SODIUM 50 MG/5ML PO LIQD
100.0000 mg | Freq: Two times a day (BID) | ORAL | Status: DC
Start: 2023-11-01 — End: 2023-11-05
  Administered 2023-11-01 – 2023-11-04 (×7): 100 mg
  Filled 2023-11-01 (×7): qty 10

## 2023-11-01 NOTE — H&P (Deleted)
 NAME:  Clifford Nichols, MRN:  [id number], DOB:  May 26, 1981, LOS: 1 ADMISSION DATE:  10/30/2023, CONSULTATION DATE:  10/31/23 REFERRING MD:  Verneda Golder   CHIEF COMPLAINT:  shortness of breath    HPI  43 y.o with significant PMH of  severe OSA, chronic respiraotry failure on 4L, extreme obesity with Alveolar hypoventilation, DM and HTN who presented to the ED with chief complaints of progressive shortness of breath.  Per ED reports, EMS was called for worsening respiratory distress. On EMS arrival, patient was found with oxygen saturation in the 80's on his normal 4L. He was placed on NRB and then transitioned to CPAP. He was treated with Duonebs and IV solumedrol enroute, shortly after he became unresponsive.   ED Course: Initial vital signs showed HR of 96 beats/minute, BP 136/67mm Hg, the RR 28breaths/minute, and the oxygen saturation 83% on CPAP and a temperature of 101.62F (38.4C). Patient was lethargic, moaned intermittently, and responded with one-word answers; symmetric movement in the arms and legs was observed. Due to worsening mental status on CPAP he was intubated for airway protection.  Pertinent Labs/Diagnostics Findings: Na+/ K+: 136/4.5 Glucose: 230  WBC: 15.3K/L with bands or neutrophil predominance    PCT: 0.27 COVID PCR: Negative,  troponin: 95  BNP: 243 ABG: pO2 59; pCO2 83; pH 7.23;  HCO3 34.8, %O2 Sat 86.2.  CXR>  CTA Chest> see results below Patient given 30 cc/kg of fluids and started on broad-spectrum antibiotics Vanco cefepime and Flagyl for sepsis with septic shock. Patient remained hypotensive despite IVF boluses therefore was started on Levophed. PCCM consulted.   Past Medical History  severe OSA, chronic respiraotry failure on 4L, extreme obesity with Alveolar hypoventilation, DM and HTN  Significant Hospital Events   4/8: Admit to ICU with acute on chronic hypoxic hypercapnic respiratory failure in the setting of pneumonia and alveolar hypoventilation   4/9 severe Hypoxia, CVl, ART line placed  Consults:  None  Procedures:  4/8: Ablation  Significant Diagnostic Tests:  4/8: Chest Xray> IMPRESSION: Low lung volumes with mild right basilar atelectasis and/or infiltrate.  4/8: CTA Chest, abdomen and pelvis> IMPRESSION: 1. Limited study without evidence of pulmonary embolism. 2. Marked severity areas of bilateral lower lobe scarring, atelectasis and/or infiltrate. 3. Mild right middle lobe atelectatic changes. 4. Properly positioned endotracheal and nasogastric tubes.  Interim History / Subjective:    Remains critically ill Remains intubated Severe hypoxia Requires VENT support for survival   Micro Data:  4/8: SARS-CoV-2 PCR> negative 4/8: Influenza PCR> negative 4/8: Blood culture x2> 4/8: MRSA PCR>>  4/8: Strep pneumo urinary antigen> 4/8: Legionella urinary antigen>  Antimicrobials:  Vancomycin 4/8 x 1 Cefepime 4/8 x1 Azithromycin 4 /8> Ceftriaxon 4/8 > Metronidazole 4/8 x1  OBJECTIVE  Blood pressure 126/86, pulse 76, temperature 99.3 F (37.4 C), resp. rate 10, height 5\' 9"  (1.753 m), weight (!) 188.2 kg, SpO2 (!) 89%.    Vent Mode: PRVC FiO2 (%):  [100 %] 100 % Set Rate:  [15 bmp-28 bmp] 24 bmp Vt Set:  [520 mL-540 mL] 540 mL PEEP:  [12 cmH20-16 cmH20] 16 cmH20 Plateau Pressure:  [30 cmH20-33 cmH20] 30 cmH20   Intake/Output Summary (Last 24 hours) at 11/01/2023 0910 Last data filed at 11/01/2023 0800 Gross per 24 hour  Intake 2281.27 ml  Output 970 ml  Net 1311.27 ml   Filed Weights   10/31/23 0229 10/31/23 1949 11/01/23 0443  Weight: (!) 188.2 kg (!) 188.2 kg (!) 188.2 kg  REVIEW OF SYSTEMS  PATIENT IS UNABLE TO PROVIDE COMPLETE REVIEW OF SYSTEMS DUE TO SEVERE CRITICAL ILLNESS   PHYSICAL EXAMINATION:  GENERAL:critically ill appearing, +resp distress EYES: Pupils equal, round, reactive to light.  No scleral icterus.  MOUTH: Moist mucosal membrane. INTUBATED NECK: Supple.  PULMONARY:  Lungs clear to auscultation CARDIOVASCULAR: S1 and S2.  Regular rate and rhythm GASTROINTESTINAL: Soft, nontender, -distended. Positive bowel sounds.  MUSCULOSKELETAL: No swelling, clubbing, or edema.  NEUROLOGIC: obtunded,sedated SKIN:normal, warm to touch, Capillary refill delayed  Pulses present bilaterally  Labs/imaging that I havepersonally reviewed  (right click and "Reselect all SmartList Selections" daily)     Labs   CBC: Recent Labs  Lab 10/30/23 2348 11/01/23 0507  WBC 15.3* 16.7*  NEUTROABS 10.1*  --   HGB 15.0 14.1  HCT 54.2* 49.7  MCV 95.1 92.2  PLT 195 179    Basic Metabolic Panel: Recent Labs  Lab 10/30/23 2348 10/31/23 0616 11/01/23 0507  NA 136 130* 132*  K 4.5 4.4 5.2*  CL 95* 96* 99  CO2 33* 22 23  GLUCOSE 230* 272* 365*  BUN 19 19 38*  CREATININE 1.09 1.31* 2.35*  CALCIUM 8.2* 8.0* 7.8*  MG  --  2.1 2.4  PHOS  --  2.2* 5.0*   GFR: Estimated Creatinine Clearance: 69.6 mL/min (A) (by C-G formula based on SCr of 2.35 mg/dL (H)). Recent Labs  Lab 10/30/23 2348 10/31/23 0136 10/31/23 0447 11/01/23 0507  PROCALCITON 0.27  --   --   --   WBC 15.3*  --   --  16.7*  LATICACIDVEN  --  1.3 1.8  --     Liver Function Tests: Recent Labs  Lab 10/30/23 2348 11/01/23 0507  AST 37  --   ALT 46*  --   ALKPHOS 61  --   BILITOT 0.9  --   PROT 9.0*  --   ALBUMIN 3.7 2.9*   No results for input(s): "LIPASE", "AMYLASE" in the last 168 hours. No results for input(s): "AMMONIA" in the last 168 hours.  ABG    Component Value Date/Time   PHART 7.28 (L) 11/01/2023 0458   PCO2ART 59 (H) 11/01/2023 0458   PO2ART 58 (L) 11/01/2023 0458   HCO3 27.7 11/01/2023 0458   ACIDBASEDEF 0.3 11/01/2023 0458   O2SAT 85.9 11/01/2023 0458     Coagulation Profile: No results for input(s): "INR", "PROTIME" in the last 168 hours.  Cardiac Enzymes: No results for input(s): "CKTOTAL", "CKMB", "CKMBINDEX", "TROPONINI" in the last 168 hours.  HbA1C: No results  found for: "HGBA1C"  CBG: Recent Labs  Lab 10/31/23 2009 10/31/23 2347 11/01/23 0123 11/01/23 0359 11/01/23 0753  GLUCAP 244* 270* 265* 334* 330*   Home Medications  Prior to Admission medications   Medication Sig Start Date End Date Taking? Authorizing Provider  amLODipine (NORVASC) 10 MG tablet Take 10 mg by mouth daily. 08/15/23  Yes [provider]  lisinopril (ZESTRIL) 20 MG tablet Take 20 mg by mouth daily. 08/15/23  Yes [provider]  metFORMIN (GLUCOPHAGE) 1000 MG tablet Take 1,000 mg by mouth 2 (two) times daily. 08/17/23  Yes [provider]  Scheduled Meds:  budesonide (PULMICORT) nebulizer solution  0.25 mg Nebulization BID   Chlorhexidine Gluconate Cloth  6 each Topical Daily   docusate  100 mg Per Tube BID   heparin  5,000 Units Subcutaneous Q8H   insulin aspart  0-20 Units Subcutaneous Q4H   insulin glargine-yfgn  14 Units Subcutaneous BID  ipratropium-albuterol  3 mL Nebulization Q6H   methylPREDNISolone (SOLU-MEDROL) injection  40 mg Intravenous Q12H   midazolam       mouth rinse  15 mL Mouth Rinse Q2H   pantoprazole (PROTONIX) IV  40 mg Intravenous Q24H   polyethylene glycol  17 g Per Tube Daily   sodium chloride flush  10-40 mL Intracatheter Q12H   Continuous Infusions:  sodium chloride 10 mL/hr at 11/01/23 0800   azithromycin 500 mg (11/01/23 0816)   cefTRIAXone (ROCEPHIN)  IV Stopped (10/31/23 2103)   dexmedetomidine (PRECEDEX) IV infusion 0.8 mcg/kg/hr (11/01/23 0800)   norepinephrine (LEVOPHED) Adult infusion 8 mcg/min (11/01/23 0800)   PRN Meds:.acetaminophen, docusate, fentaNYL (SUBLIMAZE) injection, ipratropium-albuterol, midazolam, midazolam, mouth rinse, polyethylene glycol, sodium chloride flush   Active Hospital Problem list   See systems below  Assessment & Plan:  43 yo morbidly obese male with Acute on Chronic Hypoxic and Hypercapnic Respiratory Failure due to CAP   Hx of severe OSA, Alveolar hypoventilation  and chronic respiratory failure on chronic home oxygen at 4L now with acute hypoxic hypercapnic respiratory failure failed BiPAP requiring mechanical ventilation.  CTA chest negative for PE but consistent with an infectious process.   Severe ACUTE Hypoxic and Hypercapnic Respiratory Failure -continue Mechanical Ventilator support -Wean Fio2 and PEEP as tolerated -VAP/VENT bundle implementation - Wean PEEP & FiO2 as tolerated, maintain SpO2 > 88% - Head of bed elevated 30 degrees, VAP protocol in place - Plateau pressures less than 30 cm H20  - Intermittent chest x-ray & ABG PRN - Ensure adequate pulmonary hygiene  Unable to wean Vent Mode: PRVC FiO2 (%):  [100 %] 100 % Set Rate:  [15 bmp-28 bmp] 24 bmp Vt Set:  [520 mL-540 mL] 540 mL PEEP:  [12 cmH20-16 cmH20] 16 cmH20 Plateau Pressure:  [30 cmH20-33 cmH20] 30 cmH20    Sepsis with septic shock due to suspected pneumonia Initial interventions/workup included: 30cc/kg NS/LR & Cefepime/ Vancomycin/ Flagyl meets SIRS criteria: Heart Rate 96 beats/minute, Respiratory Rate 28 breaths/minute,Temperature 101.2, -F/u cultures, trend lactic/ PCT -Monitor WBC/ fever curve -Obtain MRSA nasal swab, RVP, legionella, strep urine Ag -IV antibiotics Ceftriaxone AND Azithromycin -Gentle IVF hydration as needed -Pressors PRN for MAP goal >65 -Strict I/O's   Mildly Elevated Troponin, suspect demand ischemia PMHx: HLD, HTN -Trend HS Troponin until peaked -Hold amlodipine and Lisinopril -Obtain 2D echo-high likelihood of some type of cardiomyopathy and PULM HTN Patient did NOT respond to lasix   ACUTE KIDNEY INJURY/Renal Failure -continue Foley Catheter-assess need -Avoid nephrotoxic agents -Follow urine output, BMP -Ensure adequate renal perfusion, optimize oxygenation -Renal dose medications Nephrology consulted   Intake/Output Summary (Last 24 hours) at 11/01/2023 0914 Last data filed at 11/01/2023 0800 Gross per 24 hour  Intake 2281.27 ml   Output 970 ml  Net 1311.27 ml      Latest Ref Rng & Units 11/01/2023    5:07 AM 10/31/2023    6:16 AM 10/30/2023   11:48 PM  BMP  Glucose 70 - 99 mg/dL 409  811  914   BUN 6 - 20 mg/dL 38  19  19   Creatinine 0.61 - 1.24 mg/dL 7.82  9.56  2.13   Sodium 135 - 145 mmol/L 132  130  136   Potassium 3.5 - 5.1 mmol/L 5.2  4.4  4.5   Chloride 98 - 111 mmol/L 99  96  95   CO2 22 - 32 mmol/L 23  22  33   Calcium 8.9 - 10.3  mg/dL 7.8  8.0  8.2     NEUROLOGY ACUTE METABOLIC ENCEPHALOPATHY Sedation needs in setting of mechanical ventilation -Maintain a RASS goal of 0 to -1 -Propofol and Fentanyl to maintain RASS goal -Avoid sedating medications as able   ENDO - ICU hypoglycemic\Hyperglycemia protocol -check FSBS per protocol   GI GI PROPHYLAXIS as indicated NUTRITIONAL STATUS DIET-->TF's as tolerated Constipation protocol as indicated   ELECTROLYTES -follow labs as needed -replace as needed -pharmacy consultation and following  RESTRICTIVE TRANSFUSION PROTOCOL TRANSFUSION  IF HGB<7  or ACTIVE BLEEDING OR DX of ACUTE CORONARY SYNDROMES      Best practice:  Diet:  NPO Pain/Anxiety/Delirium protocol (if indicated): Yes (RASS goal -1) VAP protocol (if indicated): Yes DVT prophylaxis: Subcutaneous Heparin GI prophylaxis: PPI Glucose control:  SSI Yes Central venous access:  N/A Arterial line:  N/A Foley:  Yes, and it is still needed Mobility:  bed rest  PT consulted: N/A Last date of multidisciplinary goals of care discussion [updated wife at the bedside] Code Status:  full code Disposition: ICU   = Goals of Care = Code Status Order: FULL  Primary Emergency ContactDorrene Gaucher, Home Phone: [phone] Wishes to pursue full aggressive treatment and intervention options, including CPR and intubation,     DVT/GI PRX  assessed I Assessed the need for Labs I Assessed the need for Foley I Assessed the need for Central Venous Line Family Discussion when  available I Assessed the need for Mobilization I made an Assessment of medications to be adjusted accordingly Safety Risk assessment completed  CASE DISCUSSED IN MULTIDISCIPLINARY ROUNDS WITH ICU TEAM     Critical Care Time devoted to patient care services described in this note is 75 minutes.  Critical care was necessary to treat /prevent imminent and life-threatening deterioration. Overall, patient is critically ill, prognosis is guarded.  Patient with Multiorgan failure and at high risk for cardiac arrest and death.    Lady Pier, M.D.  [facility]  Medical Director [facility] Medical Director [facility] Cardio-Pulmonary Department

## 2023-11-01 NOTE — Progress Notes (Signed)
 Initial Nutrition Assessment  DOCUMENTATION CODES:   Morbid obesity  INTERVENTION:   Vital HP @75ml /hr- Initiate at 64ml/hr and increase by 10ml/hr q 8 hours until goal rate is reached.   Free water flushes 30ml q4 hours to maintain tube patency   Regimen provides 1800kcal/day, 158g/day protein and 1662ml/day of free water.   Pt at refeed risk; recommend monitor potassium, magnesium and phosphorus labs daily until stable  Daily weights   NUTRITION DIAGNOSIS:   Inadequate oral intake related to inability to eat (pt sedated and ventilatd) as evidenced by NPO status.  GOAL:   Provide needs based on ASPEN/SCCM guidelines  MONITOR:   Vent status, Labs, Weight trends, TF tolerance, Skin, I & O's  REASON FOR ASSESSMENT:   Consult Enteral/tube feeding initiation and management  ASSESSMENT:   43 y/o male with h/o OSA, chronic respiraotry failure on 4L, extreme obesity with alveolar hypoventilation, DM, HTN and a lengthy admission for COVID 19/ARDS requiring tracheostmy/cortrak tube 2021 and who is now admitted with CAP, sepsis and AKI.  Pt sedated and ventilated. OGT in place. Will plan to initiate tube feeds today. Per chart, pt's last documented weight in chart was 386lbs from February; RD unsure if patient has had any recent weight changes.    Medications reviewed and include: colace, heparin, insulin, solu-medrol, protonix, miralax, azithromycin, ceftriaxone, levophed   Labs reviewed: Na 132(L), K 5.2(H), BUN 38(H), creat 2.35(H), P 5.0(H), Mg 2.4 wnl Wbc- 16.7(H) Cbgs- 277, 330, 334, 265 x 24 hrs   Patient is currently intubated on ventilator support MV: 13.1 L/min Temp (24hrs), Avg:99.1 F (37.3 C), Min:97.9 F (36.6 C), Max:99.9 F (37.7 C)  MAP >71mmHg   UOP-   NUTRITION - FOCUSED PHYSICAL EXAM:  Flowsheet Row Most Recent Value  Orbital Region No depletion  Upper Arm Region No depletion  Thoracic and Lumbar Region No depletion  Buccal Region No  depletion  Temple Region No depletion  Clavicle Bone Region No depletion  Clavicle and Acromion Bone Region No depletion  Scapular Bone Region No depletion  Dorsal Hand No depletion  Patellar Region No depletion  Anterior Thigh Region No depletion  Posterior Calf Region No depletion  Edema (RD Assessment) None  Hair Reviewed  Eyes Reviewed  Mouth Reviewed  Skin Reviewed  Nails Reviewed   Diet Order:   Diet Order             Diet NPO time specified  Diet effective now                  EDUCATION NEEDS:   No education needs have been identified at this time  Skin:  Skin Assessment: Reviewed RN Assessment  Last BM:  4/7  Height:   Ht Readings from Last 1 Encounters:  10/31/23 5\' 9"  (1.753 m)    Weight:   Wt Readings from Last 1 Encounters:  11/01/23 (!) 188.2 kg    Ideal Body Weight:  72.7 kg  BMI:  Body mass index is 61.27 kg/m.  Estimated Nutritional Needs:   Kcal:  1600-1800kcal/day  Protein:  160-180g/day  Fluid:  2.2-2.5L/day  Torrance Freestone MS, RD, LDN If unable to be reached, please send secure chat to "RD inpatient" available from 8:00a-4:00p daily

## 2023-11-01 NOTE — Progress Notes (Signed)
 PHARMACY CONSULT NOTE - ELECTROLYTES  Pharmacy Consult for Electrolyte Monitoring and Replacement   Recent Labs: Height: 5\' 9"  (175.3 cm) Weight: (!) 188.2 kg (414 lb 14.5 oz) IBW/kg (Calculated) : 70.7 Estimated Creatinine Clearance: 87.4 mL/min (A) (by C-G formula based on SCr of 1.87 mg/dL (H)). Potassium (mmol/L)  Date Value  11/01/2023 4.7   Magnesium (mg/dL)  Date Value  78/29/5621 2.4   Calcium (mg/dL)  Date Value  30/86/5784 7.8 (L)   Albumin (g/dL)  Date Value  69/62/9528 2.9 (L)   Phosphorus (mg/dL)  Date Value  41/32/4401 5.0 (H)   Sodium (mmol/L)  Date Value  11/01/2023 134 (L)   Corrected Ca: 8.68 mg/dL        Ca 7.8  Alb 2.9  Assessment  Clifford Nichols is a 43 y.o. male presenting with SHOB. PMH significant for obesity, obstructive sleep apnea, chronic respiratory failure on 4 L of oxygen, DM, HTN. Pharmacy has been consulted to monitor and replace electrolytes.  Diet: NPO MIVF: none Pertinent medications: on Propofol drip, intubated  Goal of Therapy: Electrolytes WNL  Plan:  No replacement indicated Check BMP, Mg, Phos with AM labs  Thank you for allowing pharmacy to be a part of this patient's care.  Arsenia Goracke A Rik Wadel, PharmD Clinical Pharmacist 11/01/2023 2:55 PM

## 2023-11-01 NOTE — Progress Notes (Signed)
 NAME:  Clifford Nichols, MRN:  [id number], DOB:  Aug 13, 1980, LOS: 1 ADMISSION DATE:  10/30/2023, CONSULTATION DATE:  10/31/23 REFERRING MD:  Verneda Golder   CHIEF COMPLAINT:  shortness of breath    HPI  43 y.o with significant PMH of  severe OSA, chronic respiraotry failure on 4L, extreme obesity with Alveolar hypoventilation, DM and HTN who presented to the ED with chief complaints of progressive shortness of breath.  Per ED reports, EMS was called for worsening respiratory distress. On EMS arrival, patient was found with oxygen saturation in the 80's on his normal 4L. He was placed on NRB and then transitioned to CPAP. He was treated with Duonebs and IV solumedrol enroute, shortly after he became unresponsive.   ED Course: Initial vital signs showed HR of 96 beats/minute, BP 136/64mm Hg, the RR 28breaths/minute, and the oxygen saturation 83% on CPAP and a temperature of 101.39F (38.4C). Patient was lethargic, moaned intermittently, and responded with one-word answers; symmetric movement in the arms and legs was observed. Due to worsening mental status on CPAP he was intubated for airway protection.  Pertinent Labs/Diagnostics Findings: Na+/ K+: 136/4.5 Glucose: 230  WBC: 15.3K/L with bands or neutrophil predominance    PCT: 0.27 COVID PCR: Negative,  troponin: 95  BNP: 243 ABG: pO2 59; pCO2 83; pH 7.23;  HCO3 34.8, %O2 Sat 86.2.  CXR>  CTA Chest> see results below Patient given 30 cc/kg of fluids and started on broad-spectrum antibiotics Vanco cefepime and Flagyl for sepsis with septic shock. Patient remained hypotensive despite IVF boluses therefore was started on Levophed. PCCM consulted.   Past Medical History  severe OSA, chronic respiraotry failure on 4L, extreme obesity with Alveolar hypoventilation, DM and HTN  Significant Hospital Events   4/8: Admit to ICU with acute on chronic hypoxic hypercapnic respiratory failure in the setting of pneumonia and alveolar hypoventilation   4/9 severe Hypoxia, CVl, ART line placed  Consults:  None  Procedures:  4/8: Ablation  Significant Diagnostic Tests:  4/8: Chest Xray> IMPRESSION: Low lung volumes with mild right basilar atelectasis and/or infiltrate.  4/8: CTA Chest, abdomen and pelvis> IMPRESSION: 1. Limited study without evidence of pulmonary embolism. 2. Marked severity areas of bilateral lower lobe scarring, atelectasis and/or infiltrate. 3. Mild right middle lobe atelectatic changes. 4. Properly positioned endotracheal and nasogastric tubes.  Interim History / Subjective:    Remains critically ill Remains intubated Severe hypoxia Requires VENT support for survival   Micro Data:  4/8: SARS-CoV-2 PCR> negative 4/8: Influenza PCR> negative 4/8: Blood culture x2> 4/8: MRSA PCR>>  4/8: Strep pneumo urinary antigen> 4/8: Legionella urinary antigen>  Antimicrobials:  Vancomycin 4/8 x 1 Cefepime 4/8 x1 Azithromycin 4 /8> Ceftriaxon 4/8 > Metronidazole 4/8 x1  OBJECTIVE  Blood pressure 113/79, pulse 75, temperature 99.3 F (37.4 C), resp. rate (!) 24, height 5\' 9"  (1.753 m), weight (!) 188.2 kg, SpO2 95%.    Vent Mode: PRVC FiO2 (%):  [100 %] 100 % Set Rate:  [15 bmp-28 bmp] 24 bmp Vt Set:  [520 mL-540 mL] 540 mL PEEP:  [12 cmH20-16 cmH20] 16 cmH20 Plateau Pressure:  [30 cmH20-33 cmH20] 30 cmH20   Intake/Output Summary (Last 24 hours) at 11/01/2023 1056 Last data filed at 11/01/2023 0900 Gross per 24 hour  Intake 2526.65 ml  Output 970 ml  Net 1556.65 ml   Filed Weights   10/31/23 0229 10/31/23 1949 11/01/23 0443  Weight: (!) 188.2 kg (!) 188.2 kg (!) 188.2 kg  REVIEW OF SYSTEMS  PATIENT IS UNABLE TO PROVIDE COMPLETE REVIEW OF SYSTEMS DUE TO SEVERE CRITICAL ILLNESS   PHYSICAL EXAMINATION:  GENERAL:critically ill appearing, +resp distress EYES: Pupils equal, round, reactive to light.  No scleral icterus.  MOUTH: Moist mucosal membrane. INTUBATED NECK: Supple.  PULMONARY:  Lungs clear to auscultation CARDIOVASCULAR: S1 and S2.  Regular rate and rhythm GASTROINTESTINAL: Soft, nontender, -distended. Positive bowel sounds.  MUSCULOSKELETAL: No swelling, clubbing, or edema.  NEUROLOGIC: obtunded,sedated SKIN:normal, warm to touch, Capillary refill delayed  Pulses present bilaterally  Labs/imaging that I havepersonally reviewed  (right click and "Reselect all SmartList Selections" daily)     Labs   CBC: Recent Labs  Lab 10/30/23 2348 11/01/23 0507  WBC 15.3* 16.7*  NEUTROABS 10.1*  --   HGB 15.0 14.1  HCT 54.2* 49.7  MCV 95.1 92.2  PLT 195 179    Basic Metabolic Panel: Recent Labs  Lab 10/30/23 2348 10/31/23 0616 11/01/23 0507  NA 136 130* 132*  K 4.5 4.4 5.2*  CL 95* 96* 99  CO2 33* 22 23  GLUCOSE 230* 272* 365*  BUN 19 19 38*  CREATININE 1.09 1.31* 2.35*  CALCIUM 8.2* 8.0* 7.8*  MG  --  2.1 2.4  PHOS  --  2.2* 5.0*   GFR: Estimated Creatinine Clearance: 69.6 mL/min (A) (by C-G formula based on SCr of 2.35 mg/dL (H)). Recent Labs  Lab 10/30/23 2348 10/31/23 0136 10/31/23 0447 11/01/23 0507  PROCALCITON 0.27  --   --   --   WBC 15.3*  --   --  16.7*  LATICACIDVEN  --  1.3 1.8  --     Liver Function Tests: Recent Labs  Lab 10/30/23 2348 11/01/23 0507  AST 37  --   ALT 46*  --   ALKPHOS 61  --   BILITOT 0.9  --   PROT 9.0*  --   ALBUMIN 3.7 2.9*   No results for input(s): "LIPASE", "AMYLASE" in the last 168 hours. No results for input(s): "AMMONIA" in the last 168 hours.  ABG    Component Value Date/Time   PHART 7.37 11/01/2023 0946   PCO2ART 46 11/01/2023 0946   PO2ART 59 (L) 11/01/2023 0946   HCO3 26.6 11/01/2023 0946   ACIDBASEDEF 0.3 11/01/2023 0458   O2SAT 91.3 11/01/2023 0946     Coagulation Profile: No results for input(s): "INR", "PROTIME" in the last 168 hours.  Cardiac Enzymes: No results for input(s): "CKTOTAL", "CKMB", "CKMBINDEX", "TROPONINI" in the last 168 hours.  HbA1C: No results found  for: "HGBA1C"  CBG: Recent Labs  Lab 10/31/23 2009 10/31/23 2347 11/01/23 0123 11/01/23 0359 11/01/23 0753  GLUCAP 244* 270* 265* 334* 330*   Home Medications  Prior to Admission medications   Medication Sig Start Date End Date Taking? Authorizing Provider  amLODipine (NORVASC) 10 MG tablet Take 10 mg by mouth daily. 08/15/23  Yes [provider]  lisinopril (ZESTRIL) 20 MG tablet Take 20 mg by mouth daily. 08/15/23  Yes [provider]  metFORMIN (GLUCOPHAGE) 1000 MG tablet Take 1,000 mg by mouth 2 (two) times daily. 08/17/23  Yes [provider]  Scheduled Meds:  budesonide (PULMICORT) nebulizer solution  0.25 mg Nebulization BID   Chlorhexidine Gluconate Cloth  6 each Topical Daily   docusate  100 mg Per Tube BID   heparin  5,000 Units Subcutaneous Q8H   insulin aspart  0-20 Units Subcutaneous Q4H   insulin glargine-yfgn  14 Units Subcutaneous BID   ipratropium-albuterol  3 mL Nebulization Q6H   methylPREDNISolone (SOLU-MEDROL) injection  40 mg Intravenous Q12H   midazolam       mouth rinse  15 mL Mouth Rinse Q2H   pantoprazole (PROTONIX) IV  40 mg Intravenous Q24H   polyethylene glycol  17 g Per Tube Daily   sodium chloride flush  10-40 mL Intracatheter Q12H   Continuous Infusions:  sodium chloride Stopped (11/01/23 0816)   azithromycin 250 mL/hr at 11/01/23 0900   cefTRIAXone (ROCEPHIN)  IV Stopped (10/31/23 2103)   dexmedetomidine (PRECEDEX) IV infusion 0.8 mcg/kg/hr (11/01/23 1012)   norepinephrine (LEVOPHED) Adult infusion 5 mcg/min (11/01/23 0900)   PRN Meds:.acetaminophen, docusate, fentaNYL (SUBLIMAZE) injection, ipratropium-albuterol, midazolam, midazolam, mouth rinse, polyethylene glycol, sodium chloride flush   Active Hospital Problem list   See systems below  Assessment & Plan:  43 yo morbidly obese male with Acute on Chronic Hypoxic and Hypercapnic Respiratory Failure due to CAP   Hx of severe OSA, Alveolar hypoventilation and  chronic respiratory failure on chronic home oxygen at 4L now with acute hypoxic hypercapnic respiratory failure failed BiPAP requiring mechanical ventilation.  CTA chest negative for PE but consistent with an infectious process.   Severe ACUTE Hypoxic and Hypercapnic Respiratory Failure -continue Mechanical Ventilator support -Wean Fio2 and PEEP as tolerated -VAP/VENT bundle implementation - Wean PEEP & FiO2 as tolerated, maintain SpO2 > 88% - Head of bed elevated 30 degrees, VAP protocol in place - Plateau pressures less than 30 cm H20  - Intermittent chest x-ray & ABG PRN - Ensure adequate pulmonary hygiene  Unable to wean Vent Mode: PRVC FiO2 (%):  [100 %] 100 % Set Rate:  [15 bmp-28 bmp] 24 bmp Vt Set:  [520 mL-540 mL] 540 mL PEEP:  [12 cmH20-16 cmH20] 16 cmH20 Plateau Pressure:  [30 cmH20-33 cmH20] 30 cmH20    Sepsis with septic shock due to suspected pneumonia Initial interventions/workup included: 30cc/kg NS/LR & Cefepime/ Vancomycin/ Flagyl meets SIRS criteria: Heart Rate 96 beats/minute, Respiratory Rate 28 breaths/minute,Temperature 101.2, -F/u cultures, trend lactic/ PCT -Monitor WBC/ fever curve -Obtain MRSA nasal swab, RVP, legionella, strep urine Ag -IV antibiotics Ceftriaxone AND Azithromycin -Gentle IVF hydration as needed -Pressors PRN for MAP goal >65 -Strict I/O's   Mildly Elevated Troponin, suspect demand ischemia PMHx: HLD, HTN -Trend HS Troponin until peaked -Hold amlodipine and Lisinopril -Obtain 2D echo-high likelihood of some type of cardiomyopathy and PULM HTN Patient did NOT respond to lasix   ACUTE KIDNEY INJURY/Renal Failure -continue Foley Catheter-assess need -Avoid nephrotoxic agents -Follow urine output, BMP -Ensure adequate renal perfusion, optimize oxygenation -Renal dose medications Nephrology consulted   Intake/Output Summary (Last 24 hours) at 11/01/2023 1056 Last data filed at 11/01/2023 0900 Gross per 24 hour  Intake 2526.65 ml   Output 970 ml  Net 1556.65 ml      Latest Ref Rng & Units 11/01/2023    5:07 AM 10/31/2023    6:16 AM 10/30/2023   11:48 PM  BMP  Glucose 70 - 99 mg/dL 191  478  295   BUN 6 - 20 mg/dL 38  19  19   Creatinine 0.61 - 1.24 mg/dL 6.21  3.08  6.57   Sodium 135 - 145 mmol/L 132  130  136   Potassium 3.5 - 5.1 mmol/L 5.2  4.4  4.5   Chloride 98 - 111 mmol/L 99  96  95   CO2 22 - 32 mmol/L 23  22  33   Calcium 8.9 - 10.3 mg/dL 7.8  8.0  8.2     NEUROLOGY ACUTE METABOLIC ENCEPHALOPATHY Sedation needs in setting of mechanical ventilation -Maintain a RASS goal of 0 to -1 -Propofol and Fentanyl to maintain RASS goal -Avoid sedating medications as able   ENDO - ICU hypoglycemic\Hyperglycemia protocol -check FSBS per protocol   GI GI PROPHYLAXIS as indicated NUTRITIONAL STATUS DIET-->TF's as tolerated Constipation protocol as indicated   ELECTROLYTES -follow labs as needed -replace as needed -pharmacy consultation and following  RESTRICTIVE TRANSFUSION PROTOCOL TRANSFUSION  IF HGB<7  or ACTIVE BLEEDING OR DX of ACUTE CORONARY SYNDROMES      Best practice:  Diet:  NPO Pain/Anxiety/Delirium protocol (if indicated): Yes (RASS goal -1) VAP protocol (if indicated): Yes DVT prophylaxis: Subcutaneous Heparin GI prophylaxis: PPI Glucose control:  SSI Yes Central venous access:  N/A Arterial line:  N/A Foley:  Yes, and it is still needed Mobility:  bed rest  PT consulted: N/A Last date of multidisciplinary goals of care discussion [updated wife at the bedside] Code Status:  full code Disposition: ICU   = Goals of Care = Code Status Order: FULL  Primary Emergency ContactDorrene Gaucher, Home Phone: [phone] Wishes to pursue full aggressive treatment and intervention options, including CPR and intubation,     DVT/GI PRX  assessed I Assessed the need for Labs I Assessed the need for Foley I Assessed the need for Central Venous Line Family Discussion when  available I Assessed the need for Mobilization I made an Assessment of medications to be adjusted accordingly Safety Risk assessment completed  CASE DISCUSSED IN MULTIDISCIPLINARY ROUNDS WITH ICU TEAM     Critical Care Time devoted to patient care services described in this note is 75 minutes.  Critical care was necessary to treat /prevent imminent and life-threatening deterioration. Overall, patient is critically ill, prognosis is guarded.  Patient with Multiorgan failure and at high risk for cardiac arrest and death.    Lady Pier, M.D.  [facility]  Medical Director [facility] Medical Director [facility] Cardio-Pulmonary Department

## 2023-11-01 NOTE — IPAL (Signed)
  Interdisciplinary Goals of Care Family Meeting   Date carried out: 11/01/2023  Location of the meeting: Bedside  Member's involved: Physician, Bedside Registered Nurse, and Family Member or next of kin    GOALS OF CARE DISCUSSION  The Clinical status was relayed to family in detail- Wife and Brother  Updated and notified of patients medical condition- Patient remains unresponsive and will not open eyes to command.   Patient with increased WOB and using accessory muscles to breathe Explained to family course of therapy and the modalities  Patient with Progressive multiorgan failure with a very high probablity of a very minimal chance of meaningful recovery despite all aggressive and optimal medical therapy.    PATIENT REMAINS FULL CODE  Family understands the situation. Severe Lung damage Kidneys failing Severe hypoxia, maximal vent settings Prognosis is guarded, High risk for cardiac arrest  Family are satisfied with Plan of action and management. All questions answered  Additional CC time 35 mins   Noboru Bidinger Nestora Baptise, M.D.  [facility]  Medical Director [facility] Medical Director [facility] Cardio-Pulmonary Department

## 2023-11-01 NOTE — Inpatient Diabetes Management (Signed)
 Inpatient Diabetes Program Recommendations  AACE/ADA: New Consensus Statement on Inpatient Glycemic Control   Target Ranges:  Prepandial:   less than 140 mg/dL      Peak postprandial:   less than 180 mg/dL (1-2 hours)      Critically ill patients:  140 - 180 mg/dL    Latest Reference Range & Units 11/01/23 01:23 11/01/23 03:59 11/01/23 07:53  Glucose-Capillary 70 - 99 mg/dL 161 (H) 096 (H) 045 (H)   Review of Glycemic Control  Diabetes history: DM2 Outpatient Diabetes medications: Metformin 1000 mg BID Current orders for Inpatient glycemic control: Novolog 0-20 units Q4H; Solumedrol 40 mg Q12H  Inpatient Diabetes Program Recommendations:    Insulin: If steroids are continued as ordered, please consider ordering Semglee 14 units BID (based on 188.2 kg x 0.15 units).  Thanks, Beacher Limerick, RN, MSN, CDCES Diabetes Coordinator Inpatient Diabetes Program [phone] (Team Pager from 8am to 5pm)

## 2023-11-01 NOTE — Consult Note (Signed)
 [facility] CONSULT NOTE    Date: 11/01/2023                  Patient Name:  Clifford Nichols  MRN: [id number]  DOB: 06-Jul-1981  Age / Sex: 43 y.o., male         PCP: Dionicia Frater, MD                 Service Requesting Consult: Critical care                 Reason for Consult: Acute kidney injury            History of Present Illness: Patient is a 43 y.o. male with a PMHx of severe obstructive sleep apnea, chronic respiratory load on 4 L of oxygen, extreme obesity, diabetes mellitus type 2, hypertension, who was admitted to [facility] on 10/30/2023 for evaluation of significant shortness of breath.  Upon presentation to the emergency department patient was found to be in significant respiratory distress.  His O2 saturation was in the 80s upon arrival.  He was placed on nonrebreather mask and unfortunately transition to CPAP.  Thereafter he became unresponsive.  Subsequently endotracheal tube was placed.  Patient currently awake and alert and able to follow commands while on the ventilator this a.m.  Patient's mother is at the bedside.  We are now asked to see him for acute kidney injury.  He is hypotensive and on pressors.  Upon admission creatinine was 0.09.  Creatinine now up to 2.35.  Serum sodium also bit low at 132.  Potassium slightly high at 5.2.   Medications: Outpatient medications: Medications Prior to Admission  Medication Sig Dispense Refill Last Dose/Taking   amLODipine (NORVASC) 10 MG tablet Take 10 mg by mouth daily.   10/30/2023   lisinopril (ZESTRIL) 20 MG tablet Take 20 mg by mouth daily.   10/30/2023   metFORMIN (GLUCOPHAGE) 1000 MG tablet Take 1,000 mg by mouth 2 (two) times daily.   10/30/2023    Current medications: Current Facility-Administered Medications  Medication Dose Route Frequency Provider Last Rate Last Admin   0.9 %  sodium chloride infusion  250 mL Intravenous Continuous Rust-Chester, Britton L, NP 10 mL/hr at 11/01/23 1300 Infusion  Verify at 11/01/23 1300   acetaminophen (TYLENOL) tablet 650 mg  650 mg Per NG tube Q6H PRN Rust-Chester, Britton L, NP       azithromycin (ZITHROMAX) 500 mg in sodium chloride 0.9 % 250 mL IVPB  500 mg Intravenous Q24H Gideon Kussmaul, NP   Stopped at 11/01/23 0916   budesonide (PULMICORT) nebulizer solution 0.25 mg  0.25 mg Nebulization BID Ouma, Elizabeth Achieng, NP   0.25 mg at 11/01/23 0735   cefTRIAXone (ROCEPHIN) 2 g in sodium chloride 0.9 % 100 mL IVPB  2 g Intravenous Q24H Dobbs, Andrea K, [facility]   Stopped at 10/31/23 2103   Chlorhexidine Gluconate Cloth 2 % PADS 6 each  6 each Topical Daily Kasa, Kurian, MD   6 each at 10/31/23 2015   dexmedetomidine (PRECEDEX) 400 MCG/100ML (4 mcg/mL) infusion  0-1.2 mcg/kg/hr Intravenous Titrated Nelson, Dana G, NP 37.6 mL/hr at 11/01/23 1300 0.8 mcg/kg/hr at 11/01/23 1300   docusate (COLACE) 50 MG/5ML liquid 100 mg  100 mg Per Tube BID PRN Gideon Kussmaul, NP       docusate (COLACE) 50 MG/5ML liquid 100 mg  100 mg Per Tube BID Rust-Chester, Britton L, NP   100 mg at 11/01/23 1013  feeding supplement (VITAL HIGH PROTEIN) liquid 1,000 mL  1,000 mL Per Tube Continuous Kasa, Kurian, MD       fentaNYL (SUBLIMAZE) injection 50 mcg  50 mcg Intravenous Q1H PRN Rust-Chester, Jenni Mody L, NP       free water 30 mL  30 mL Per Tube Q4H Kasa, Kurian, MD       heparin injection 5,000 Units  5,000 Units Subcutaneous Q8H Ouma, Elizabeth Achieng, NP   5,000 Units at 11/01/23 1302   insulin aspart (novoLOG) injection 0-20 Units  0-20 Units Subcutaneous Q4H Rust-Chester, Britton L, NP   11 Units at 11/01/23 1227   insulin glargine-yfgn (SEMGLEE) injection 14 Units  14 Units Subcutaneous BID Kasa, Kurian, MD   14 Units at 11/01/23 1227   ipratropium-albuterol (DUONEB) 0.5-2.5 (3) MG/3ML nebulizer solution 3 mL  3 mL Nebulization Q6H Gideon Kussmaul, NP   3 mL at 11/01/23 1301   ipratropium-albuterol (DUONEB) 0.5-2.5 (3) MG/3ML nebulizer solution 3 mL  3  mL Nebulization Q6H PRN Gideon Kussmaul, NP       methylPREDNISolone sodium succinate (SOLU-MEDROL) 40 mg/mL injection 40 mg  40 mg Intravenous Q12H Nelson, Dana G, NP   40 mg at 11/01/23 1012   midazolam (VERSED) 2 MG/2ML injection            midazolam (VERSED) injection 1-2 mg  1-2 mg Intravenous Q1H PRN Rust-Chester, Britton L, NP       norepinephrine (LEVOPHED) 4mg  in (0.016 mg/mL) premix infusion  0-40 mcg/min Intravenous Continuous Rust-Chester, Britton L, NP 11.25 mL/hr at 11/01/23 1300 3 mcg/min at 11/01/23 1300   Oral care mouth rinse  15 mL Mouth Rinse Q2H Kasa, Sundra Engel, MD   15 mL at 11/01/23 1303   Oral care mouth rinse  15 mL Mouth Rinse PRN Kasa, Kurian, MD       pantoprazole (PROTONIX) injection 40 mg  40 mg Intravenous Q24H Gideon Kussmaul, NP   40 mg at 11/01/23 0820   polyethylene glycol (MIRALAX / GLYCOLAX) packet 17 g  17 g Per Tube Daily PRN Gideon Kussmaul, NP       polyethylene glycol (MIRALAX / GLYCOLAX) packet 17 g  17 g Per Tube Daily Rust-Chester, Britton L, NP   17 g at 11/01/23 1013   sodium chloride flush (NS) 0.9 % injection 10-40 mL  10-40 mL Intracatheter Q12H Kasa, Kurian, MD   10 mL at 11/01/23 1012   sodium chloride flush (NS) 0.9 % injection 10-40 mL  10-40 mL Intracatheter PRN Cleve Dale, MD          Allergies: Not on File    Past Medical History: Past Medical History:  Diagnosis Date   Obesity    Obesity hypoventilation syndrome (HCC)    OSA (obstructive sleep apnea)      Past Surgical History: History reviewed. No pertinent surgical history.   Family History: History reviewed. No pertinent family history.   Social History: Social History   Socioeconomic History   Marital status: Married    Spouse name: Not on file   Number of children: Not on file   Years of education: Not on file   Highest education level: Not on file  Occupational History   Not on file  Tobacco Use   Smoking status: Never     Passive exposure: Never   Smokeless tobacco: Never  Substance and Sexual Activity   Alcohol use: Not on file   Drug use: Not on file  Sexual activity: Not on file  Other Topics Concern   Not on file  Social History Narrative   Not on file   Social Drivers of Health   Financial Resource Strain: Not on file  Food Insecurity: No Food Insecurity (10/31/2023)   Hunger Vital Sign    Worried About Running Out of Food in the Last Year: Never true    Ran Out of Food in the Last Year: Never true  Transportation Needs: No Transportation Needs (10/31/2023)   PRAPARE - Administrator, Civil Service (Medical): No    Lack of Transportation (Non-Medical): No  Physical Activity: Not on file  Stress: Not on file  Social Connections: Not on file  Intimate Partner Violence: Not At Risk (10/31/2023)   Humiliation, Afraid, Rape, and Kick questionnaire    Fear of Current or Ex-Partner: No    Emotionally Abused: No    Physically Abused: No    Sexually Abused: No     Review of Systems: Patient unable to provide as easily on the ventilator  Vital Signs: Blood pressure 103/74, pulse 80, temperature 99.7 F (37.6 C), resp. rate (!) 24, height 5\' 9"  (1.753 m), weight (!) 188.2 kg, SpO2 90%.  Weight trends: Filed Weights   10/31/23 0229 10/31/23 1949 11/01/23 0443  Weight: (!) 188.2 kg (!) 188.2 kg (!) 188.2 kg     Physical Exam: General: Critically ill-appearing  Head: Normocephalic, atraumatic.  Endotracheal tube in place  Eyes: Anicteric  Neck: Supple  Lungs:  Scattered rales and rhonchi, vent assisted  Heart: S1S2 no rubs  Abdomen:  Soft, nontender, bowel sounds present  Extremities: Trace peripheral edema.  Neurologic: Awake, alert, following commands  Skin: No acute rash  Access: No hemodialysis access    Lab results: Basic Metabolic Panel: Recent Labs  Lab 10/30/23 2348 10/31/23 0616 11/01/23 0507  NA 136 130* 132*  K 4.5 4.4 5.2*  CL 95* 96* 99  CO2 33* 22 23   GLUCOSE 230* 272* 365*  BUN 19 19 38*  CREATININE 1.09 1.31* 2.35*  CALCIUM 8.2* 8.0* 7.8*  MG  --  2.1 2.4  PHOS  --  2.2* 5.0*    Liver Function Tests: Recent Labs  Lab 10/30/23 2348 11/01/23 0507  AST 37  --   ALT 46*  --   ALKPHOS 61  --   BILITOT 0.9  --   PROT 9.0*  --   ALBUMIN 3.7 2.9*   No results for input(s): "LIPASE", "AMYLASE" in the last 168 hours. No results for input(s): "AMMONIA" in the last 168 hours.  CBC: Recent Labs  Lab 10/30/23 2348 11/01/23 0507  WBC 15.3* 16.7*  NEUTROABS 10.1*  --   HGB 15.0 14.1  HCT 54.2* 49.7  MCV 95.1 92.2  PLT 195 179    Cardiac Enzymes: No results for input(s): "CKTOTAL", "CKMB", "CKMBINDEX", "TROPONINI" in the last 168 hours.  BNP: Invalid input(s): "POCBNP"  CBG: Recent Labs  Lab 10/31/23 2347 11/01/23 0123 11/01/23 0359 11/01/23 0753 11/01/23 1126  GLUCAP 270* 265* 334* 330* 277*    Microbiology: Results for orders placed or performed during the hospital encounter of 10/30/23  Blood culture (routine x 2)     Status: None (Preliminary result)   Collection Time: 10/30/23 11:50 PM   Specimen: BLOOD  Result Value Ref Range Status   Specimen Description BLOOD BLOOD RIGHT HAND  Final   Special Requests   Final    BOTTLES DRAWN AEROBIC AND ANAEROBIC Blood Culture  adequate volume   Culture   Final    NO GROWTH 1 DAY Performed at [facility], [address] [place], [place] [address]    Report Status PENDING  Incomplete  Resp panel by RT-PCR (RSV, Flu A&B, Covid) Anterior Nasal Swab     Status: None   Collection Time: 10/31/23 12:33 AM   Specimen: Anterior Nasal Swab  Result Value Ref Range Status   SARS Coronavirus 2 by RT PCR NEGATIVE NEGATIVE Final    Comment: (NOTE) SARS-CoV-2 target nucleic acids are NOT DETECTED.  The SARS-CoV-2 RNA is generally detectable in upper respiratory specimens during the acute phase of infection. The lowest concentration of SARS-CoV-2 viral copies this  assay can detect is 138 copies/mL. A negative result does not preclude SARS-Cov-2 infection and should not be used as the sole basis for treatment or other patient management decisions. A negative result may occur with  improper specimen collection/handling, submission of specimen other than nasopharyngeal swab, presence of viral mutation(s) within the areas targeted by this assay, and inadequate number of viral copies(<138 copies/mL). A negative result must be combined with clinical observations, patient history, and epidemiological information. The expected result is Negative.  Fact Sheet for Patients:  BloggerCourse.com  Fact Sheet for Healthcare Providers:  SeriousBroker.it  This test is no t yet approved or cleared by the [place]  FDA and  has been authorized for detection and/or diagnosis of SARS-CoV-2 by FDA under an Emergency Use Authorization (EUA). This EUA will remain  in effect (meaning this test can be used) for the duration of the COVID-19 declaration under Section 564(b)(1) of the Act, 21 U.S.C.section 360bbb-3(b)(1), unless the authorization is terminated  or revoked sooner.       Influenza A by PCR NEGATIVE NEGATIVE Final   Influenza B by PCR NEGATIVE NEGATIVE Final    Comment: (NOTE) The Xpert Xpress SARS-CoV-2/FLU/RSV plus assay is intended as an aid in the diagnosis of influenza from Nasopharyngeal swab specimens and should not be used as a sole basis for treatment. Nasal washings and aspirates are unacceptable for Xpert Xpress SARS-CoV-2/FLU/RSV testing.  Fact Sheet for Patients: BloggerCourse.com  Fact Sheet for Healthcare Providers: SeriousBroker.it  This test is not yet approved or cleared by the [place]  FDA and has been authorized for detection and/or diagnosis of SARS-CoV-2 by FDA under an Emergency Use Authorization (EUA). This EUA will  remain in effect (meaning this test can be used) for the duration of the COVID-19 declaration under Section 564(b)(1) of the Act, 21 U.S.C. section 360bbb-3(b)(1), unless the authorization is terminated or revoked.     Resp Syncytial Virus by PCR NEGATIVE NEGATIVE Final    Comment: (NOTE) Fact Sheet for Patients: BloggerCourse.com  Fact Sheet for Healthcare Providers: SeriousBroker.it  This test is not yet approved or cleared by the [place]  FDA and has been authorized for detection and/or diagnosis of SARS-CoV-2 by FDA under an Emergency Use Authorization (EUA). This EUA will remain in effect (meaning this test can be used) for the duration of the COVID-19 declaration under Section 564(b)(1) of the Act, 21 U.S.C. section 360bbb-3(b)(1), unless the authorization is terminated or revoked.  Performed at [facility], [address] [place], [place] [address]   Blood culture (routine x 2)     Status: None (Preliminary result)   Collection Time: 10/31/23  1:36 AM   Specimen: BLOOD  Result Value Ref Range Status   Specimen Description   Final    BLOOD RIGHT ANTECUBITAL Performed  at [facility], [address] [place], [place] [address]    Special Requests   Final    BOTTLES DRAWN AEROBIC AND ANAEROBIC Blood Culture results may not be optimal due to an inadequate volume of blood received in culture bottles Performed at [facility], [address] [place], [place] [address]    Culture  Setup Time   Final    IN BOTH AEROBIC AND ANAEROBIC BOTTLES GRAM POSITIVE COCCI CALLED TO ANGELA DOBBS 1631 10/31/23 LRL    Culture   Final    GRAM POSITIVE COCCI TOO [place] TO READ Performed at [facility], [address] [place], [place] [address]    Report Status PENDING  Incomplete  Blood Culture ID Panel (Reflexed)     Status: Abnormal   Collection Time: 10/31/23  1:36 AM  Result Value Ref Range  Status   Enterococcus faecalis NOT DETECTED NOT DETECTED Final   Enterococcus Faecium NOT DETECTED NOT DETECTED Final   Listeria monocytogenes NOT DETECTED NOT DETECTED Final   Staphylococcus species DETECTED (A) NOT DETECTED Corrected    Comment: CRITICAL RESULT CALLED TO, READ BACK BY AND VERIFIED WITH: CALLED TO ANGELA DOBBS 1631 10/31/23 LRL CORRECTED ON 04/09 AT 0852: PREVIOUSLY REPORTED AS DETECTED CALLED TO ANGELA DOBBS 1631 10/31/23 LRL    Staphylococcus aureus (BCID) NOT DETECTED NOT DETECTED Final   Staphylococcus epidermidis DETECTED (A) NOT DETECTED Corrected    Comment: CRITICAL RESULT CALLED TO, READ BACK BY AND VERIFIED WITH: CALLED TO ANGELA DOBBS 1631 10/31/23 LRL CORRECTED ON 04/09 AT 0852: PREVIOUSLY REPORTED AS DETECTED CALLED TO ANGELA DOBBS 1631 10/31/23 LRL    Staphylococcus lugdunensis NOT DETECTED NOT DETECTED Final   Streptococcus species NOT DETECTED NOT DETECTED Final   Streptococcus agalactiae NOT DETECTED NOT DETECTED Final   Streptococcus pneumoniae NOT DETECTED NOT DETECTED Final   Streptococcus pyogenes NOT DETECTED NOT DETECTED Final   A.calcoaceticus-baumannii NOT DETECTED NOT DETECTED Final   Bacteroides fragilis NOT DETECTED NOT DETECTED Final   Enterobacterales NOT DETECTED NOT DETECTED Final   Enterobacter cloacae complex NOT DETECTED NOT DETECTED Final   Escherichia coli NOT DETECTED NOT DETECTED Final   Klebsiella aerogenes NOT DETECTED NOT DETECTED Final   Klebsiella oxytoca NOT DETECTED NOT DETECTED Final   Klebsiella pneumoniae NOT DETECTED NOT DETECTED Final   Proteus species NOT DETECTED NOT DETECTED Final   Salmonella species NOT DETECTED NOT DETECTED Final   Serratia marcescens NOT DETECTED NOT DETECTED Final   Haemophilus influenzae NOT DETECTED NOT DETECTED Final   Neisseria meningitidis NOT DETECTED NOT DETECTED Final   Pseudomonas aeruginosa NOT DETECTED NOT DETECTED Final   Stenotrophomonas maltophilia NOT DETECTED NOT DETECTED  Final   Candida albicans NOT DETECTED NOT DETECTED Final   Candida auris NOT DETECTED NOT DETECTED Final   Candida glabrata NOT DETECTED NOT DETECTED Final   Candida krusei NOT DETECTED NOT DETECTED Final   Candida parapsilosis NOT DETECTED NOT DETECTED Final   Candida tropicalis NOT DETECTED NOT DETECTED Final   Cryptococcus neoformans/gattii NOT DETECTED NOT DETECTED Final   Methicillin resistance mecA/C NOT DETECTED NOT DETECTED Final    Comment: Performed at [facility], [address] [place], [place] [address]  Respiratory (~20 pathogens) panel by PCR     Status: None   Collection Time: 10/31/23 12:07 PM   Specimen: Nasopharyngeal Swab; Respiratory  Result Value Ref Range Status   Adenovirus NOT DETECTED NOT DETECTED Final   Coronavirus 229E NOT DETECTED NOT DETECTED  Final    Comment: (NOTE) The Coronavirus on the Respiratory Panel, DOES NOT test for the novel  Coronavirus (2019 nCoV)    Coronavirus HKU1 NOT DETECTED NOT DETECTED Final   Coronavirus NL63 NOT DETECTED NOT DETECTED Final   Coronavirus OC43 NOT DETECTED NOT DETECTED Final   Metapneumovirus NOT DETECTED NOT DETECTED Final   Rhinovirus / Enterovirus NOT DETECTED NOT DETECTED Final   Influenza A NOT DETECTED NOT DETECTED Final   Influenza B NOT DETECTED NOT DETECTED Final   Parainfluenza Virus 1 NOT DETECTED NOT DETECTED Final   Parainfluenza Virus 2 NOT DETECTED NOT DETECTED Final   Parainfluenza Virus 3 NOT DETECTED NOT DETECTED Final   Parainfluenza Virus 4 NOT DETECTED NOT DETECTED Final   Respiratory Syncytial Virus NOT DETECTED NOT DETECTED Final   Bordetella pertussis NOT DETECTED NOT DETECTED Final   Bordetella Parapertussis NOT DETECTED NOT DETECTED Final   Chlamydophila pneumoniae NOT DETECTED NOT DETECTED Final   Mycoplasma pneumoniae NOT DETECTED NOT DETECTED Final    Comment: Performed at [facility], [address], [place], [place] [address]  Culture, Respiratory w Gram Stain      Status: None (Preliminary result)   Collection Time: 10/31/23  4:53 PM   Specimen: Tracheal Aspirate; Respiratory  Result Value Ref Range Status   Specimen Description   Final    TRACHEAL ASPIRATE Performed at [facility], [address] [place], [place] [address]    Special Requests   Final    NONE Performed at [facility], [address] [place], [place] [address]    Gram Stain   Final    NO WBC SEEN RARE Hillis Lu POSITIVE COCCI RARE GRAM POSITIVE RODS    Culture   Final    TOO [place] TO READ Performed at [facility], [address], [place], [place] [address]    Report Status PENDING  Incomplete    Coagulation Studies: No results for input(s): "LABPROT", "INR" in the last 72 hours.  Urinalysis: No results for input(s): "COLORURINE", "LABSPEC", "PHURINE", "GLUCOSEU", "HGBUR", "BILIRUBINUR", "KETONESUR", "PROTEINUR", "UROBILINOGEN", "NITRITE", "LEUKOCYTESUR" in the last 72 hours.  Invalid input(s): "APPERANCEUR"    Imaging: DG Chest Port 1 View Result Date: 11/01/2023 CLINICAL DATA:  Status post PICC line placement. EXAM: PORTABLE CHEST 1 VIEW COMPARISON:  10/31/2023 FINDINGS: The endotracheal tube tip is approximately 4.5 cm above the carina. There is a new right IJ catheter with tip in the projection of the SVC. No pneumothorax identified. No PICC line identified. Enteric tube tip courses below the level of the GE junction. Cardiac enlargement. Atelectasis/consolidation is again noted in both lung bases. IMPRESSION: 1. New right IJ catheter with tip in the projection of the SVC. No pneumothorax. 2. No PICC line identified. 3. Persistent bibasilar atelectasis/consolidation. Electronically Signed   By: Kimberley Penman M.D.   On: 11/01/2023 06:55   DG Chest Port 1 View Result Date: 10/31/2023 CLINICAL DATA:  Endotracheal tube present. EXAM: PORTABLE CHEST 1 VIEW COMPARISON:  Earlier today FINDINGS: Endotracheal tube tip is at the level of the clavicular  heads approximately 5.4 cm from the carina. Lung volumes are low with increasing bibasilar volume loss. Enteric tube is faintly visualized to the level of the lower chest. Cardiomegaly is grossly unchanged. IMPRESSION: 1. Endotracheal tube tip at the level of the clavicular heads approximately 5.4 cm from the carina. 2. Low lung volumes with increasing bibasilar volume loss. Electronically Signed   By: Alvina Axon.D.  On: 10/31/2023 18:31   CT Angio Chest PE W and/or Wo Contrast Result Date: 10/31/2023 CLINICAL DATA:  Shortness of breath. EXAM: CT ANGIOGRAPHY CHEST WITH CONTRAST TECHNIQUE: Multidetector CT imaging of the chest was performed using the standard protocol during bolus administration of intravenous contrast. Multiplanar CT image reconstructions and MIPs were obtained to evaluate the vascular anatomy. RADIATION DOSE REDUCTION: This exam was performed according to the departmental dose-optimization program which includes automated exposure control, adjustment of the mA and/or kV according to patient size and/or use of iterative reconstruction technique. CONTRAST:  OMNIPAQUE IOHEXOL 350 MG/ML SOLN COMPARISON:  April 26, 2018 FINDINGS: Cardiovascular: The thoracic aorta is unremarkable. The pulmonary arteries are limited in evaluation secondary to suboptimal opacification with intravenous contrast. This study is also limited secondary to the patient's large body habitus and subsequent beam hardening artifact. No evidence of pulmonary embolism. Normal heart size. No pericardial effusion. Mediastinum/Nodes: Properly positioned endotracheal and nasogastric tubes are seen. No enlarged mediastinal, hilar, or axillary lymph nodes. Thyroid gland, trachea, and esophagus demonstrate no significant findings. Lungs/Pleura: Marked severity areas of scarring, atelectasis and/or infiltrate are seen within the posteromedial aspect of the bilateral lower lobes. Mild right middle lobe atelectatic changes are  also present. No pleural effusion or pneumothorax is identified. Upper Abdomen: No acute abnormality. Musculoskeletal: No chest wall abnormality. No acute or significant osseous findings. Review of the MIP images confirms the above findings. IMPRESSION: 1. Limited study without evidence of pulmonary embolism. 2. Marked severity areas of bilateral lower lobe scarring, atelectasis and/or infiltrate. 3. Mild right middle lobe atelectatic changes. 4. Properly positioned endotracheal and nasogastric tubes. Electronically Signed   By: Virgle Grime M.D.   On: 10/31/2023 03:58   DG Abdomen 1 View Result Date: 10/31/2023 CLINICAL DATA:  Orogastric tube placement. EXAM: ABDOMEN - 1 VIEW COMPARISON:  Dec 13, 2019 FINDINGS: There is stable endotracheal tube positioning. An enteric tube is noted with its distal end overlying the body of the stomach. The distal side hole sits approximately 7.8 cm from the gastroesophageal junction. The bowel gas pattern is normal. No radio-opaque calculi or other significant radiographic abnormality are seen. IMPRESSION: Enteric tube positioning, as described above. Electronically Signed   By: Virgle Grime M.D.   On: 10/31/2023 01:20   DG Chest Portable 1 View Result Date: 10/31/2023 CLINICAL DATA:  Status post intubation EXAM: PORTABLE CHEST 1 VIEW COMPARISON:  October 30, 2023 FINDINGS: Since the prior study there is been interval placement of an endotracheal tube with its distal tip approximately 3.8 cm from the carina. The cardiac silhouette is enlarged and unchanged in size. Low lung volumes are seen with mild left perihilar, left suprahilar and right basilar atelectasis and/or infiltrate. No pleural effusion or pneumothorax is identified. The visualized skeletal structures are unremarkable. IMPRESSION: 1. Interval endotracheal tube placement, as described above. 2. Low lung volumes with mild left perihilar, left suprahilar and right basilar atelectasis and/or infiltrate.  Electronically Signed   By: Virgle Grime M.D.   On: 10/31/2023 01:16   DG Chest Portable 1 View Result Date: 10/31/2023 CLINICAL DATA:  Respiratory distress. EXAM: PORTABLE CHEST 1 VIEW COMPARISON:  April 23, 2020 FINDINGS: The cardiac silhouette is enlarged. This may be, in part, technical in origin. Low lung volumes are seen with mild prominence of the central pulmonary vasculature. Prominence of the bronchovascular lung markings is also noted. Mild atelectasis and/or infiltrate is suspected within the right lung base. No pleural effusion or pneumothorax is identified. The visualized skeletal  structures are unremarkable. IMPRESSION: Low lung volumes with mild right basilar atelectasis and/or infiltrate. Electronically Signed   By: Virgle Grime M.D.   On: 10/31/2023 01:14     Assessment & Plan: Pt is a 43 y.o. male with a PMHx of severe obstructive sleep apnea, chronic respiratory load on 4 L of oxygen, extreme obesity, diabetes mellitus type 2, hypertension, who was admitted to [facility] on 10/30/2023 for evaluation of significant shortness of breath.   1.  Acute kidney injury.  Acute kidney injury likely multifactorial with contributions from contrast exposure as well as hypotension.  Suspect he most likely has ATN.  No immediate need for dialysis as the patient is producing some urine.  We will need to monitor renal parameters closely.  Avoid further nephrotoxins as possible.  2.  Acute respiratory failure.  Patient with underlying chronic respiratory failure and also appears to have pneumonia.  Mechanical ventilation as per pulmonary/critical care.  3.  Hypotension.  Patient on norepinephrine earlier.  Maintain the patient on pressors to maintain a MAP of 65 or greater.  4.  Hyponatremia.  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  Continue to monitor serum sodium closely.

## 2023-11-01 NOTE — Progress Notes (Addendum)
 Acute Hypoxic / Hypercapnic Respiratory Failure 20:00- ABG worsening with SpO2 bedside in the low 80's. Discussed case with Intensivist, who is in agreement with recommendations below - lasix 40 mg - PEEP increased to 16 - levophed started, titrate PRN to maintain MAP > 65  21:30-Repeat ABG improved with SpO2 bedside in the high 80's. Monitoring ABG closely. Discussed and updated mom who is bedside.  01:00- Patient dropped SpO2 into the high 70's. Patient a difficult stick for blood gases. Discussed with the patient, his spouse and mom bedside. They are in agreement to place arterial line, in order to monitor oxygenation closely. Repeat ABG stable, continue same settings.  04:00- SpO2 dropped into the 70's, ABG worse with PaO2 < 55 at 51 and SpO2 79%. Discussed the option of Proning with patient and spouse who are in agreement to proceed if needed. Consented for central line placement, will obtain CXR and follow up ABG.  05:20- follow up ABG improved with PaO2 > 55 and spO2 back in the 80's. Discussed with spouse, will hold on prone positioning. Continue to follow ABG closely.  Additional CC time: 30 minutes  Eliott Guess, AGACNP-BC Acute Care Nurse Practitioner [place] [facility]   [phone] / [phone] Please see Amion for details.

## 2023-11-01 NOTE — Progress Notes (Signed)
   11/01/23 1430  Spiritual Encounters  Type of Visit Initial  Care provided to: Family (Son in waiting room)  Referral source Chaplain assessment  Reason for visit Routine spiritual support  OnCall Visit Yes  Spiritual Framework  Presenting Themes Impactful experiences and emotions;Other (comment) (for Family)  Interventions  Spiritual Care Interventions Made Established relationship of care and support;Compassionate presence;Reflective listening (for Family)  Intervention Outcomes  Outcomes Connection to spiritual care;Awareness around self/spiritual resourses;Connection to values and goals of care;Awareness of support;Other (comment) (for Family)

## 2023-11-01 NOTE — Progress Notes (Signed)
 Bed delivered from ED order for this patient.

## 2023-11-01 NOTE — Procedures (Signed)
 Arterial Catheter Insertion Procedure Note  Courtenay Jamison  [id number]  Jun 12, 1981  Date:11/01/23  Time:12:58 AM    Provider Performing: Gara July Rust-Chester    Procedure: Insertion of Arterial Line (40981) with US  guidance (19147)   Indication(s) Blood pressure monitoring and/or need for frequent ABGs  Consent Risks of the procedure as well as the alternatives and risks of each were explained to the patient and/or caregiver.  Consent for the procedure was obtained and is signed in the bedside chart  Anesthesia Precedex drip infusing & fentanyl IVP   Time Out Verified patient identification, verified procedure, site/side was marked, verified correct patient position, special equipment/implants available, medications/allergies/relevant history reviewed, required imaging and test results available.   Sterile Technique Maximal sterile technique including full sterile barrier drape, hand hygiene, sterile gown, sterile gloves, mask, hair covering, sterile ultrasound probe cover (if used).   Procedure Description Area of catheter insertion was cleaned with chlorhexidine and draped in sterile fashion. With real-time ultrasound guidance an arterial catheter was placed into the left radial artery.  Appropriate arterial tracings confirmed on monitor.     Complications/Tolerance None; patient tolerated the procedure well.   EBL Minimal   Specimen(s) None   Eliott Guess, AGACNP-BC Acute Care Nurse Practitioner  Pulmonary & Critical Care   [phone] / [phone] Please see Amion for details.

## 2023-11-01 NOTE — Plan of Care (Signed)

## 2023-11-01 NOTE — Procedures (Signed)
 Central Venous Catheter Insertion Procedure Note  Clifford Nichols  [id number]  May 05, 1981  Date:11/01/23  Time:5:08 AM   Provider Performing:Laurencia Roma L Rust-Chester   Procedure: Insertion of Non-tunneled Central Venous Catheter(36556) with US  guidance (78295)   Indication(s) Medication administration and Difficult access  Consent Risks of the procedure as well as the alternatives and risks of each were explained to the patient and/or caregiver.  Consent for the procedure was obtained and is signed in the bedside chart  Anesthesia Topical only with 1% lidocaine , precedex drip infusing, fentanyl IVP  Timeout Verified patient identification, verified procedure, site/side was marked, verified correct patient position, special equipment/implants available, medications/allergies/relevant history reviewed, required imaging and test results available.  Sterile Technique Maximal sterile technique including full sterile barrier drape, hand hygiene, sterile gown, sterile gloves, mask, hair covering, sterile ultrasound probe cover (if used).  Procedure Description Area of catheter insertion was cleaned with chlorhexidine and draped in sterile fashion.  With real-time ultrasound guidance a central venous catheter was placed into the right internal jugular vein. Nonpulsatile blood flow and easy flushing noted in all ports.  The catheter was sutured in place and sterile dressing applied.  Complications/Tolerance None; patient tolerated the procedure well. Chest X-ray is ordered to verify placement for internal jugular or subclavian cannulation.   Chest x-ray is not ordered for femoral cannulation.  EBL Minimal  Specimen(s) None  Eliott Guess, AGACNP-BC Acute Care Nurse Practitioner [place] [facility]   [phone] / [phone] Please see Amion for details.

## 2023-11-02 DIAGNOSIS — N179 Acute kidney failure, unspecified: Secondary | ICD-10-CM

## 2023-11-02 DIAGNOSIS — R6521 Severe sepsis with septic shock: Secondary | ICD-10-CM | POA: Diagnosis not present

## 2023-11-02 DIAGNOSIS — B957 Other staphylococcus as the cause of diseases classified elsewhere: Secondary | ICD-10-CM

## 2023-11-02 DIAGNOSIS — J9601 Acute respiratory failure with hypoxia: Secondary | ICD-10-CM | POA: Diagnosis not present

## 2023-11-02 DIAGNOSIS — A419 Sepsis, unspecified organism: Secondary | ICD-10-CM | POA: Diagnosis not present

## 2023-11-02 DIAGNOSIS — J9602 Acute respiratory failure with hypercapnia: Secondary | ICD-10-CM | POA: Diagnosis not present

## 2023-11-02 LAB — BLOOD GAS, ARTERIAL
Acid-Base Excess: 4.5 mmol/L — ABNORMAL HIGH (ref 0.0–2.0)
Acid-Base Excess: 1.2 mmol/L (ref 0.0–2.0)
Bicarbonate: 29.8 mmol/L — ABNORMAL HIGH (ref 20.0–28.0)
Bicarbonate: 26 mmol/L (ref 20.0–28.0)
FIO2: 100 %
FIO2: 90 %
MECHVT: 540 mL
Mechanical Rate: 24
O2 Saturation: 93.7 %
PEEP: 12 cmH2O
PEEP: 16 cmH2O
Patient temperature: 37
Patient temperature: 37
RATE: 28 {breaths}/min
Spontaneous VT: 520 mL
pCO2 arterial: 46 mmHg (ref 32–48)
pCO2 arterial: 41 mmHg (ref 32–48)
pH, Arterial: 7.42 (ref 7.35–7.45)
pH, Arterial: 7.41 (ref 7.35–7.45)
pO2, Arterial: 54 mmHg — ABNORMAL LOW (ref 83–108)
pO2, Arterial: 80 mmHg — ABNORMAL LOW (ref 83–108)

## 2023-11-02 LAB — GLUCOSE, CAPILLARY
Glucose-Capillary: 229 mg/dL — ABNORMAL HIGH (ref 70–99)
Glucose-Capillary: 242 mg/dL — ABNORMAL HIGH (ref 70–99)
Glucose-Capillary: 230 mg/dL — ABNORMAL HIGH (ref 70–99)
Glucose-Capillary: 233 mg/dL — ABNORMAL HIGH (ref 70–99)
Glucose-Capillary: 239 mg/dL — ABNORMAL HIGH (ref 70–99)

## 2023-11-02 LAB — ECHOCARDIOGRAM COMPLETE
Area-P 1/2: 3.81 cm2
Height: 69 in
S' Lateral: 3.8 cm
Weight: 6638.49 [oz_av]

## 2023-11-02 LAB — BASIC METABOLIC PANEL WITH GFR
Anion gap: 11 (ref 5–15)
BUN: 39 mg/dL — ABNORMAL HIGH (ref 6–20)
CO2: 27 mmol/L (ref 22–32)
Calcium: 8.5 mg/dL — ABNORMAL LOW (ref 8.9–10.3)
Chloride: 99 mmol/L (ref 98–111)
Creatinine, Ser: 1.63 mg/dL — ABNORMAL HIGH (ref 0.61–1.24)
GFR, Estimated: 54 mL/min — ABNORMAL LOW (ref >60)
Glucose, Bld: 254 mg/dL — ABNORMAL HIGH (ref 70–99)
Potassium: 4.5 mmol/L (ref 3.5–5.1)
Sodium: 137 mmol/L (ref 135–145)

## 2023-11-02 LAB — CBC
HCT: 48.1 % (ref 39.0–52.0)
Hemoglobin: 13.8 g/dL (ref 13.0–17.0)
MCH: 25.9 pg — ABNORMAL LOW (ref 26.0–34.0)
MCHC: 28.7 g/dL — ABNORMAL LOW (ref 30.0–36.0)
MCV: 90.4 fL (ref 80.0–100.0)
Platelets: 177 10*3/uL (ref 150–400)
RBC: 5.32 MIL/uL (ref 4.22–5.81)
RDW: 15.4 % (ref 11.5–15.5)
WBC: 12.3 10*3/uL — ABNORMAL HIGH (ref 4.0–10.5)
nRBC: 0.5 % — ABNORMAL HIGH (ref 0.0–0.2)

## 2023-11-02 LAB — PHOSPHORUS: Phosphorus: 3.5 mg/dL (ref 2.5–4.6)

## 2023-11-02 LAB — MAGNESIUM: Magnesium: 2.7 mg/dL — ABNORMAL HIGH (ref 1.7–2.4)

## 2023-11-02 MED ORDER — INSULIN ASPART 100 UNIT/ML IJ SOLN
6.0000 [IU] | INTRAMUSCULAR | Status: DC
Start: 2023-11-03 — End: 2023-11-03
  Administered 2023-11-02 – 2023-11-03 (×2): 6 [IU] via SUBCUTANEOUS
  Filled 2023-11-02 (×2): qty 1

## 2023-11-02 MED ORDER — INSULIN ASPART 100 UNIT/ML IJ SOLN
4.0000 [IU] | INTRAMUSCULAR | Status: DC
  Administered 2023-11-02 (×3): 4 [IU] via SUBCUTANEOUS
  Filled 2023-11-02 (×3): qty 1

## 2023-11-02 MED ORDER — STERILE WATER FOR INJECTION IJ SOLN
INTRAMUSCULAR | Status: AC
  Administered 2023-11-02: 10 mL
  Filled 2023-11-02: qty 10

## 2023-11-02 MED ORDER — INSULIN GLARGINE-YFGN 100 UNIT/ML ~~LOC~~ SOLN
20.0000 [IU] | Freq: Two times a day (BID) | SUBCUTANEOUS | Status: DC
  Administered 2023-11-02 – 2023-11-15 (×26): 20 [IU] via SUBCUTANEOUS
  Filled 2023-11-02 (×28): qty 0.2

## 2023-11-02 MED ORDER — STERILE WATER FOR INJECTION IJ SOLN
INTRAMUSCULAR | Status: AC
  Filled 2023-11-02: qty 10

## 2023-11-02 MED ORDER — ACETAZOLAMIDE SODIUM 500 MG IJ SOLR
500.0000 mg | Freq: Once | INTRAMUSCULAR | Status: AC
  Administered 2023-11-02: 500 mg via INTRAVENOUS
  Filled 2023-11-02: qty 500

## 2023-11-02 MED ORDER — INSULIN GLARGINE-YFGN 100 UNIT/ML ~~LOC~~ SOLN
16.0000 [IU] | Freq: Two times a day (BID) | SUBCUTANEOUS | Status: DC
  Administered 2023-11-02: 16 [IU] via SUBCUTANEOUS
  Filled 2023-11-02 (×2): qty 0.16

## 2023-11-02 MED ORDER — ACETAZOLAMIDE SODIUM 500 MG IJ SOLR
500.0000 mg | Freq: Once | INTRAMUSCULAR | Status: AC
Start: 2023-11-02 — End: 2023-11-02
  Administered 2023-11-02: 500 mg via INTRAVENOUS
  Filled 2023-11-02 (×2): qty 500

## 2023-11-02 NOTE — Progress Notes (Signed)
 NAME:  Clifford Nichols, MRN:  [id number], DOB:  1981-06-22, LOS: 2 ADMISSION DATE:  10/30/2023, CONSULTATION DATE:  10/31/23 REFERRING MD:  Verneda Golder   CHIEF COMPLAINT:  shortness of breath    HPI  43 y.o with significant PMH of  severe OSA, chronic respiraotry failure on 4L, extreme obesity with Alveolar hypoventilation, DM and HTN who presented to the ED with chief complaints of progressive shortness of breath.  Per ED reports, EMS was called for worsening respiratory distress. On EMS arrival, patient was found with oxygen saturation in the 80's on his normal 4L. He was placed on NRB and then transitioned to CPAP. He was treated with Duonebs and IV solumedrol enroute, shortly after he became unresponsive.   ED Course: Initial vital signs showed HR of 96 beats/minute, BP 136/81mm Hg, the RR 28breaths/minute, and the oxygen saturation 83% on CPAP and a temperature of 101.23F (38.4C). Patient was lethargic, moaned intermittently, and responded with one-word answers; symmetric movement in the arms and legs was observed. Due to worsening mental status on CPAP he was intubated for airway protection.  Pertinent Labs/Diagnostics Findings: Na+/ K+: 136/4.5 Glucose: 230  WBC: 15.3K/L with bands or neutrophil predominance    PCT: 0.27 COVID PCR: Negative,  troponin: 95  BNP: 243 ABG: pO2 59; pCO2 83; pH 7.23;  HCO3 34.8, %O2 Sat 86.2.  CXR>  CTA Chest> see results below Patient given 30 cc/kg of fluids and started on broad-spectrum antibiotics Vanco cefepime and Flagyl for sepsis with septic shock. Patient remained hypotensive despite IVF boluses therefore was started on Levophed. PCCM consulted.   Past Medical History  severe OSA, chronic respiraotry failure on 4L, extreme obesity with Alveolar hypoventilation, DM and HTN  Significant Hospital Events   4/8: Admit to ICU with acute on chronic hypoxic hypercapnic respiratory failure in the setting of pneumonia and alveolar hypoventilation   4/9 severe Hypoxia, CVl, ART line placed 4/10 severe hypoxia Po2 in 80's  Consults:  None  Procedures:  4/8: Ablation  Significant Diagnostic Tests:  4/8: Chest Xray> IMPRESSION: Low lung volumes with mild right basilar atelectasis and/or infiltrate.  4/8: CTA Chest, abdomen and pelvis> IMPRESSION: 1. Limited study without evidence of pulmonary embolism. 2. Marked severity areas of bilateral lower lobe scarring, atelectasis and/or infiltrate. 3. Mild right middle lobe atelectatic changes. 4. Properly positioned endotracheal and nasogastric tubes.  Interim History / Subjective:    Remains critically ill Remains intubated Severe hypoxia Requires VENT support for survival Vent Mode: PRVC FiO2 (%):  [90 %-100 %] 90 % Set Rate:  [24 bmp] 24 bmp Vt Set:  [540 mL] 540 mL PEEP:  [16 cmH20] 16 cmH20 Plateau Pressure:  [27 cmH20-33 cmH20] 33 cmH20    Micro Data:  4/8: SARS-CoV-2 PCR> negative 4/8: Influenza PCR> negative 4/8: Blood culture x2> 4/8: MRSA PCR>>  4/8: Strep pneumo urinary antigen> 4/8: Legionella urinary antigen> Results for orders placed or performed during the hospital encounter of 10/30/23  Blood culture (routine x 2)     Status: None (Preliminary result)   Collection Time: 10/30/23 11:50 PM   Specimen: BLOOD  Result Value Ref Range Status   Specimen Description BLOOD BLOOD RIGHT HAND  Final   Special Requests   Final    BOTTLES DRAWN AEROBIC AND ANAEROBIC Blood Culture adequate volume   Culture   Final    NO GROWTH 2 DAYS Performed at [facility], [address] [place], [place] [address]    Report Status PENDING  Incomplete  Resp panel by RT-PCR (RSV, Flu A&B, Covid) Anterior Nasal Swab     Status: None   Collection Time: 10/31/23 12:33 AM   Specimen: Anterior Nasal Swab  Result Value Ref Range Status   SARS Coronavirus 2 by RT PCR NEGATIVE NEGATIVE Final    Comment: (NOTE) SARS-CoV-2 target nucleic acids are NOT DETECTED.  The  SARS-CoV-2 RNA is generally detectable in upper respiratory specimens during the acute phase of infection. The lowest concentration of SARS-CoV-2 viral copies this assay can detect is 138 copies/mL. A negative result does not preclude SARS-Cov-2 infection and should not be used as the sole basis for treatment or other patient management decisions. A negative result may occur with  improper specimen collection/handling, submission of specimen other than nasopharyngeal swab, presence of viral mutation(s) within the areas targeted by this assay, and inadequate number of viral copies(<138 copies/mL). A negative result must be combined with clinical observations, patient history, and epidemiological information. The expected result is Negative.  Fact Sheet for Patients:  BloggerCourse.com  Fact Sheet for Healthcare Providers:  SeriousBroker.it  This test is no t yet approved or cleared by the [place]  FDA and  has been authorized for detection and/or diagnosis of SARS-CoV-2 by FDA under an Emergency Use Authorization (EUA). This EUA will remain  in effect (meaning this test can be used) for the duration of the COVID-19 declaration under Section 564(b)(1) of the Act, 21 U.S.C.section 360bbb-3(b)(1), unless the authorization is terminated  or revoked sooner.       Influenza A by PCR NEGATIVE NEGATIVE Final   Influenza B by PCR NEGATIVE NEGATIVE Final    Comment: (NOTE) The Xpert Xpress SARS-CoV-2/FLU/RSV plus assay is intended as an aid in the diagnosis of influenza from Nasopharyngeal swab specimens and should not be used as a sole basis for treatment. Nasal washings and aspirates are unacceptable for Xpert Xpress SARS-CoV-2/FLU/RSV testing.  Fact Sheet for Patients: BloggerCourse.com  Fact Sheet for Healthcare Providers: SeriousBroker.it  This test is not yet approved or  cleared by the [place]  FDA and has been authorized for detection and/or diagnosis of SARS-CoV-2 by FDA under an Emergency Use Authorization (EUA). This EUA will remain in effect (meaning this test can be used) for the duration of the COVID-19 declaration under Section 564(b)(1) of the Act, 21 U.S.C. section 360bbb-3(b)(1), unless the authorization is terminated or revoked.     Resp Syncytial Virus by PCR NEGATIVE NEGATIVE Final    Comment: (NOTE) Fact Sheet for Patients: BloggerCourse.com  Fact Sheet for Healthcare Providers: SeriousBroker.it  This test is not yet approved or cleared by the [place]  FDA and has been authorized for detection and/or diagnosis of SARS-CoV-2 by FDA under an Emergency Use Authorization (EUA). This EUA will remain in effect (meaning this test can be used) for the duration of the COVID-19 declaration under Section 564(b)(1) of the Act, 21 U.S.C. section 360bbb-3(b)(1), unless the authorization is terminated or revoked.  Performed at [facility], [address] [place], [place] [address]   Blood culture (routine x 2)     Status: Abnormal (Preliminary result)   Collection Time: 10/31/23  1:36 AM   Specimen: BLOOD  Result Value Ref Range Status   Specimen Description   Final    BLOOD RIGHT ANTECUBITAL Performed at [facility], [address], [place], [place] [address]    Special Requests   Final    BOTTLES DRAWN AEROBIC AND ANAEROBIC Blood Culture results may not be  optimal due to an inadequate volume of blood received in culture bottles Performed at [facility], [address] [place], [place] [address]    Culture  Setup Time   Final    IN BOTH AEROBIC AND ANAEROBIC BOTTLES GRAM POSITIVE COCCI CALLED TO ANGELA DOBBS 1631 10/31/23 LRL    Culture (A)  Final    STAPHYLOCOCCUS HOMINIS THE SIGNIFICANCE OF ISOLATING THIS ORGANISM FROM A SINGLE SET OF BLOOD  CULTURES WHEN MULTIPLE SETS ARE DRAWN IS UNCERTAIN. PLEASE NOTIFY THE MICROBIOLOGY DEPARTMENT WITHIN ONE WEEK IF SPECIATION AND SENSITIVITIES ARE REQUIRED. CULTURE REINCUBATED FOR BETTER GROWTH Performed at [facility], [address] [place], [place] [address]    Report Status PENDING  Incomplete  Blood Culture ID Panel (Reflexed)     Status: Abnormal   Collection Time: 10/31/23  1:36 AM  Result Value Ref Range Status   Enterococcus faecalis NOT DETECTED NOT DETECTED Final   Enterococcus Faecium NOT DETECTED NOT DETECTED Final   Listeria monocytogenes NOT DETECTED NOT DETECTED Final   Staphylococcus species DETECTED (A) NOT DETECTED Corrected    Comment: CRITICAL RESULT CALLED TO, READ BACK BY AND VERIFIED WITH: CALLED TO ANGELA DOBBS 1631 10/31/23 LRL CORRECTED ON 04/09 AT 0852: PREVIOUSLY REPORTED AS DETECTED CALLED TO ANGELA DOBBS 1631 10/31/23 LRL    Staphylococcus aureus (BCID) NOT DETECTED NOT DETECTED Final   Staphylococcus epidermidis DETECTED (A) NOT DETECTED Corrected    Comment: CRITICAL RESULT CALLED TO, READ BACK BY AND VERIFIED WITH: CALLED TO ANGELA DOBBS 1631 10/31/23 LRL CORRECTED ON 04/09 AT 0852: PREVIOUSLY REPORTED AS DETECTED CALLED TO ANGELA DOBBS 1631 10/31/23 LRL    Staphylococcus lugdunensis NOT DETECTED NOT DETECTED Final   Streptococcus species NOT DETECTED NOT DETECTED Final   Streptococcus agalactiae NOT DETECTED NOT DETECTED Final   Streptococcus pneumoniae NOT DETECTED NOT DETECTED Final   Streptococcus pyogenes NOT DETECTED NOT DETECTED Final   A.calcoaceticus-baumannii NOT DETECTED NOT DETECTED Final   Bacteroides fragilis NOT DETECTED NOT DETECTED Final   Enterobacterales NOT DETECTED NOT DETECTED Final   Enterobacter cloacae complex NOT DETECTED NOT DETECTED Final   Escherichia coli NOT DETECTED NOT DETECTED Final   Klebsiella aerogenes NOT DETECTED NOT DETECTED Final   Klebsiella oxytoca NOT DETECTED NOT DETECTED Final   Klebsiella  pneumoniae NOT DETECTED NOT DETECTED Final   Proteus species NOT DETECTED NOT DETECTED Final   Salmonella species NOT DETECTED NOT DETECTED Final   Serratia marcescens NOT DETECTED NOT DETECTED Final   Haemophilus influenzae NOT DETECTED NOT DETECTED Final   Neisseria meningitidis NOT DETECTED NOT DETECTED Final   Pseudomonas aeruginosa NOT DETECTED NOT DETECTED Final   Stenotrophomonas maltophilia NOT DETECTED NOT DETECTED Final   Candida albicans NOT DETECTED NOT DETECTED Final   Candida auris NOT DETECTED NOT DETECTED Final   Candida glabrata NOT DETECTED NOT DETECTED Final   Candida krusei NOT DETECTED NOT DETECTED Final   Candida parapsilosis NOT DETECTED NOT DETECTED Final   Candida tropicalis NOT DETECTED NOT DETECTED Final   Cryptococcus neoformans/gattii NOT DETECTED NOT DETECTED Final   Methicillin resistance mecA/C NOT DETECTED NOT DETECTED Final    Comment: Performed at [facility], [address] [place], [place] [address]  Respiratory (~20 pathogens) panel by PCR     Status: None   Collection Time: 10/31/23 12:07 PM   Specimen: Nasopharyngeal Swab; Respiratory  Result Value Ref Range Status   Adenovirus NOT DETECTED NOT DETECTED Final   Coronavirus 229E NOT DETECTED NOT DETECTED  Final    Comment: (NOTE) The Coronavirus on the Respiratory Panel, DOES NOT test for the novel  Coronavirus (2019 nCoV)    Coronavirus HKU1 NOT DETECTED NOT DETECTED Final   Coronavirus NL63 NOT DETECTED NOT DETECTED Final   Coronavirus OC43 NOT DETECTED NOT DETECTED Final   Metapneumovirus NOT DETECTED NOT DETECTED Final   Rhinovirus / Enterovirus NOT DETECTED NOT DETECTED Final   Influenza A NOT DETECTED NOT DETECTED Final   Influenza B NOT DETECTED NOT DETECTED Final   Parainfluenza Virus 1 NOT DETECTED NOT DETECTED Final   Parainfluenza Virus 2 NOT DETECTED NOT DETECTED Final   Parainfluenza Virus 3 NOT DETECTED NOT DETECTED Final   Parainfluenza Virus 4 NOT DETECTED NOT  DETECTED Final   Respiratory Syncytial Virus NOT DETECTED NOT DETECTED Final   Bordetella pertussis NOT DETECTED NOT DETECTED Final   Bordetella Parapertussis NOT DETECTED NOT DETECTED Final   Chlamydophila pneumoniae NOT DETECTED NOT DETECTED Final   Mycoplasma pneumoniae NOT DETECTED NOT DETECTED Final    Comment: Performed at [facility], [address] [place], [place] [address]  Culture, Respiratory w Gram Stain     Status: None (Preliminary result)   Collection Time: 10/31/23  4:53 PM   Specimen: Tracheal Aspirate; Respiratory  Result Value Ref Range Status   Specimen Description   Final    TRACHEAL ASPIRATE Performed at [facility], [address] [place], [place] [address]    Special Requests   Final    NONE Performed at [facility], [address] [place], [place] [address]    Gram Stain   Final    NO WBC SEEN RARE Hillis Lu POSITIVE COCCI RARE GRAM POSITIVE RODS    Culture   Final    TOO Olsson TO READ Performed at [facility], [address] [place], [place] [address]    Report Status PENDING  Incomplete  MRSA Next Gen by PCR, Nasal     Status: None   Collection Time: 11/01/23  6:17 PM   Specimen: Nasal Mucosa; Nasal Swab  Result Value Ref Range Status   MRSA by PCR Next Gen NOT DETECTED NOT DETECTED Final    Comment: (NOTE) The GeneXpert MRSA Assay (FDA approved for NASAL specimens only), is one component of a comprehensive MRSA colonization surveillance program. It is not intended to diagnose MRSA infection nor to guide or monitor treatment for MRSA infections. Test performance is not FDA approved in patients less than 63 years old. Performed at [facility], [address] [place], [place] [address]      Antimicrobials:  Vancomycin 4/8 x 1 Cefepime 4/8 x1 Azithromycin 4 /8> Ceftriaxon 4/8 > Metronidazole 4/8 x1  OBJECTIVE  Blood pressure 125/77, pulse 78, temperature (!) 101.5 F (38.6 C), temperature  source Bladder, resp. rate 17, height 5\' 9"  (1.753 m), weight (!) 188.3 kg, SpO2 95%.    Vent Mode: PRVC FiO2 (%):  [90 %-100 %] 90 % Set Rate:  [24 bmp] 24 bmp Vt Set:  [540 mL] 540 mL PEEP:  [16 cmH20] 16 cmH20 Plateau Pressure:  [27 cmH20-33 cmH20] 33 cmH20   Intake/Output Summary (Last 24 hours) at 11/02/2023 0719 Last data filed at 11/02/2023 0700 Gross per 24 hour  Intake 2612.21 ml  Output 1825 ml  Net 787.21 ml   Filed Weights   10/31/23 1949 11/01/23 0443 11/02/23 0319  Weight: (!) 188.2 kg (!) 188.2 kg (!) 188.3 kg      REVIEW  OF SYSTEMS  PATIENT IS UNABLE TO PROVIDE COMPLETE REVIEW OF SYSTEMS DUE TO SEVERE CRITICAL ILLNESS   PHYSICAL EXAMINATION:  GENERAL:critically ill appearing, +resp distress EYES: Pupils equal, round, reactive to light.  No scleral icterus.  MOUTH: Moist mucosal membrane. INTUBATED NECK: Supple.  PULMONARY: Lungs clear to auscultation, +rhonchi, +wheezing CARDIOVASCULAR: S1 and S2.  Regular rate and rhythm GASTROINTESTINAL: Soft, nontender, -distended. Positive bowel sounds.  MUSCULOSKELETAL: No swelling, clubbing, or edema.  NEUROLOGIC: obtunded,sedated SKIN:normal, warm to touch, Capillary refill delayed  Pulses present bilaterally  Labs/imaging that I havepersonally reviewed  (right click and "Reselect all SmartList Selections" daily)     Labs   CBC: Recent Labs  Lab 10/30/23 2348 11/01/23 0507 11/02/23 0507  WBC 15.3* 16.7* 12.3*  NEUTROABS 10.1*  --   --   HGB 15.0 14.1 13.8  HCT 54.2* 49.7 48.1  MCV 95.1 92.2 90.4  PLT 195 179 177    Basic Metabolic Panel: Recent Labs  Lab 10/30/23 2348 10/31/23 0616 11/01/23 0507 11/01/23 1409 11/02/23 0507  NA 136 130* 132* 134* 137  K 4.5 4.4 5.2* 4.7 4.5  CL 95* 96* 99 99 99  CO2 33* 22 23 27 27   GLUCOSE 230* 272* 365* 287* 254*  BUN 19 19 38* 40* 39*  CREATININE 1.09 1.31* 2.35* 1.87* 1.63*  CALCIUM 8.2* 8.0* 7.8* 7.8* 8.5*  MG  --  2.1 2.4  --  2.7*  PHOS  --  2.2*  5.0*  --  3.5   GFR: Estimated Creatinine Clearance: 100.3 mL/min (A) (by C-G formula based on SCr of 1.63 mg/dL (H)). Recent Labs  Lab 10/30/23 2348 10/31/23 0136 10/31/23 0447 11/01/23 0507 11/02/23 0507  PROCALCITON 0.27  --   --   --   --   WBC 15.3*  --   --  16.7* 12.3*  LATICACIDVEN  --  1.3 1.8  --   --     Liver Function Tests: Recent Labs  Lab 10/30/23 2348 11/01/23 0507  AST 37  --   ALT 46*  --   ALKPHOS 61  --   BILITOT 0.9  --   PROT 9.0*  --   ALBUMIN 3.7 2.9*   No results for input(s): "LIPASE", "AMYLASE" in the last 168 hours. No results for input(s): "AMMONIA" in the last 168 hours.  ABG    Component Value Date/Time   PHART 7.41 11/02/2023 0500   PCO2ART 41 11/02/2023 0500   PO2ART 80 (L) 11/02/2023 0500   HCO3 26.0 11/02/2023 0500   ACIDBASEDEF 0.3 11/01/2023 0458   O2SAT 93.7 11/02/2023 0500     Coagulation Profile: No results for input(s): "INR", "PROTIME" in the last 168 hours.  Cardiac Enzymes: No results for input(s): "CKTOTAL", "CKMB", "CKMBINDEX", "TROPONINI" in the last 168 hours.  HbA1C: Hgb A1c MFr Bld  Date/Time Value Ref Range Status  10/31/2023 06:23 PM 6.8 (H) 4.8 - 5.6 % Final    Comment:    (NOTE)         Prediabetes: 5.7 - 6.4         Diabetes: >6.4         Glycemic control for adults with diabetes: <7.0     CBG: Recent Labs  Lab 11/01/23 1126 11/01/23 1701 11/01/23 1959 11/01/23 2337 11/02/23 0347  GLUCAP 277* 223* 193* 211* 229*   Home Medications  Prior to Admission medications   Medication Sig Start Date End Date Taking? Authorizing Provider  amLODipine (NORVASC) 10 MG tablet Take 10  mg by mouth daily. 08/15/23  Yes [provider]  lisinopril (ZESTRIL) 20 MG tablet Take 20 mg by mouth daily. 08/15/23  Yes [provider]  metFORMIN (GLUCOPHAGE) 1000 MG tablet Take 1,000 mg by mouth 2 (two) times daily. 08/17/23  Yes [provider]  Scheduled Meds:  acetaZOLAMIDE  500 mg  Intravenous Once   budesonide (PULMICORT) nebulizer solution  0.25 mg Nebulization BID   Chlorhexidine Gluconate Cloth  6 each Topical Daily   docusate  100 mg Per Tube BID   free water  30 mL Per Tube Q4H   heparin  5,000 Units Subcutaneous Q8H   insulin aspart  0-20 Units Subcutaneous Q4H   insulin glargine-yfgn  16 Units Subcutaneous BID   ipratropium-albuterol  3 mL Nebulization Q6H   methylPREDNISolone (SOLU-MEDROL) injection  40 mg Intravenous Q12H   mouth rinse  15 mL Mouth Rinse Q2H   pantoprazole (PROTONIX) IV  40 mg Intravenous Q24H   polyethylene glycol  17 g Per Tube Daily   sodium chloride flush  10-40 mL Intracatheter Q12H   Continuous Infusions:  azithromycin Stopped (11/01/23 0916)   cefTRIAXone (ROCEPHIN)  IV Stopped (11/01/23 2034)   dexmedetomidine (PRECEDEX) IV infusion 1.2 mcg/kg/hr (11/02/23 0700)   feeding supplement (VITAL HIGH PROTEIN) 35 mL/hr at 11/02/23 0700   norepinephrine (LEVOPHED) Adult infusion Stopped (11/02/23 0615)   PRN Meds:.acetaminophen, docusate, fentaNYL (SUBLIMAZE) injection, ipratropium-albuterol, midazolam, mouth rinse, polyethylene glycol, sodium chloride flush   Active Hospital Problem list   See systems below  Assessment & Plan:  43 yo morbidly obese male with Acute on Chronic Hypoxic and Hypercapnic Respiratory Failure due to CAP, 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  CTA chest negative for PE but consistent with an infectious process.    Severe ACUTE Hypoxic and Hypercapnic Respiratory Failure -continue Mechanical Ventilator support -Wean Fio2 and PEEP as tolerated -VAP/VENT bundle implementation - Wean PEEP & FiO2 as tolerated,  maintain SpO2 > 88% - Head of bed elevated 30 degrees, VAP protocol in place - Plateau pressures less than 30 cm H20  - Intermittent chest x-ray & ABG PRN - Ensure adequate pulmonary hygiene  Unable to wean Vent Mode: PRVC FiO2 (%):  [90 %-100 %] 90 % Set Rate:  [24 bmp] 24 bmp Vt Set:  [540 mL] 540 mL PEEP:  [16 cmH20] 16 cmH20 Plateau Pressure:  [27 cmH20-33 cmH20] 33 cmH20    Sepsis with septic shock due to suspected pneumonia STAPH HOMINIS BACTEREMIA -F/u cultures, trend lactic/ PCT -Monitor WBC/ fever curve -IV antibiotics Ceftriaxone AND Azithromycin -Pressors PRN for MAP goal >65 -Strict I/O's   Mildly Elevated Troponin, suspect demand ischemia PMHx: HLD, HTN -Trend HS Troponin until peaked -Hold amlodipine and Lisinopril -Obtain 2D echo-high likelihood of some type of cardiomyopathy and PULM HTN 4/9 Lasix  4/10 diamox   ACUTE KIDNEY INJURY/Renal Failure -continue Foley Catheter-assess need -Avoid nephrotoxic agents -Follow urine output, BMP -Ensure adequate renal perfusion, optimize oxygenation -Renal dose medications   Intake/Output Summary (Last 24 hours) at 11/02/2023 0722 Last data filed at 11/02/2023 0700 Gross per 24 hour  Intake 2612.21 ml  Output 1825 ml  Net 787.21 ml        Latest Ref Rng & Units 11/02/2023  5:07 AM 11/01/2023    2:09 PM 11/01/2023    5:07 AM  BMP  Glucose 70 - 99 mg/dL 161  096  045   BUN 6 - 20 mg/dL 39  40  38   Creatinine 0.61 - 1.24 mg/dL 4.09  8.11  9.14   Sodium 135 - 145 mmol/L 137  134  132   Potassium 3.5 - 5.1 mmol/L 4.5  4.7  5.2   Chloride 98 - 111 mmol/L 99  99  99   CO2 22 - 32 mmol/L 27  27  23    Calcium 8.9 - 10.3 mg/dL 8.5  7.8  7.8     NEUROLOGY -Avoid sedating medications as able Precedex only, awake and follows commands   ENDO - ICU hypoglycemic\Hyperglycemia protocol -check FSBS per protocol   GI GI PROPHYLAXIS as indicated NUTRITIONAL STATUS DIET-->TF's as tolerated Constipation protocol  as indicated   ELECTROLYTES -follow labs as needed -replace as needed -pharmacy consultation and following  RESTRICTIVE TRANSFUSION PROTOCOL TRANSFUSION  IF HGB<7  or ACTIVE BLEEDING OR DX of ACUTE CORONARY SYNDROMES        Best practice:  Diet:  NPO Pain/Anxiety/Delirium protocol (if indicated): Yes (RASS goal -1) VAP protocol (if indicated): Yes DVT prophylaxis: Subcutaneous Heparin GI prophylaxis: PPI Glucose control:  SSI Yes Central venous access:  N/A Arterial line:  N/A Foley:  Yes, and it is still needed Mobility:  bed rest  PT consulted: N/A Last date of multidisciplinary goals of care discussion [updated wife at the bedside] Code Status:  full code Disposition: ICU     DVT/GI PRX  assessed I Assessed the need for Labs I Assessed the need for Foley I Assessed the need for Central Venous Line Family Discussion when available I Assessed the need for Mobilization I made an Assessment of medications to be adjusted accordingly Safety Risk assessment completed  CASE DISCUSSED IN MULTIDISCIPLINARY ROUNDS WITH ICU TEAM     Critical Care Time devoted to patient care services described in this note is 60 minutes.  Critical care was necessary to treat /prevent imminent and life-threatening deterioration. Overall, patient is critically ill, prognosis is guarded.  Patient with Multiorgan failure and at high risk for cardiac arrest and death.    Lady Pier, M.D.  [facility]  Medical Director [facility] Medical Director [facility] Cardio-Pulmonary Department

## 2023-11-02 NOTE — Progress Notes (Signed)
 PHARMACY CONSULT NOTE - ELECTROLYTES  Pharmacy Consult for Electrolyte Monitoring and Replacement   Recent Labs: Height: 5\' 9"  (175.3 cm) Weight: (!) 188.3 kg (415 lb 2 oz) IBW/kg (Calculated) : 70.7 Estimated Creatinine Clearance: 100.3 mL/min (A) (by C-G formula based on SCr of 1.63 mg/dL (H)). Potassium (mmol/L)  Date Value  11/02/2023 4.5   Magnesium (mg/dL)  Date Value  16/04/9603 2.7 (H)   Calcium (mg/dL)  Date Value  54/03/8118 8.5 (L)   Albumin (g/dL)  Date Value  14/78/2956 2.9 (L)   Phosphorus (mg/dL)  Date Value  21/30/8657 3.5   Sodium (mmol/L)  Date Value  11/02/2023 137   Corrected Ca: 8.68 mg/dL        Ca 7.8  Alb 2.9  Assessment  Clifford Nichols is a 43 y.o. male presenting with SHOB. PMH significant for obesity, obstructive sleep apnea, chronic respiratory failure on 4 L of oxygen, DM, HTN. Pharmacy has been consulted to monitor and replace electrolytes.  Diet: NPO MIVF: none Pertinent medications: on Propofol drip, intubated  Goal of Therapy: Electrolytes WNL  Plan:  No replacement indicated Check BMP, Mg, Phos with AM labs  Thank you for allowing pharmacy to be a part of this patient's care.  Marzella Miracle A Katlynne Mckercher, PharmD Clinical Pharmacist 11/02/2023 7:23 AM

## 2023-11-02 NOTE — Plan of Care (Signed)

## 2023-11-02 NOTE — Plan of Care (Signed)
  Problem: Education: Goal: Knowledge of General Education information will improve Description: Including pain rating scale, medication(s)/side effects and non-pharmacologic comfort measures Outcome: Progressing   Problem: Nutrition: Goal: Adequate nutrition will be maintained Outcome: Progressing   Problem: Coping: Goal: Level of anxiety will decrease Outcome: Progressing   Problem: Pain Managment: Goal: General experience of comfort will improve and/or be controlled Outcome: Progressing   Problem: Skin Integrity: Goal: Risk for impaired skin integrity will decrease Outcome: Progressing

## 2023-11-02 NOTE — Progress Notes (Signed)
 [facility]  ROUNDING NOTE   Subjective:   Patient remains on the ventilator. Creatinine down to 1.6. Good urine output. Lab Results  Component Value Date   CREATININE 1.63 (H) 11/02/2023   CREATININE 1.87 (H) 11/01/2023   CREATININE 2.35 (H) 11/01/2023   04/09 0701 - 04/10 0700 In: 2612.2 [I.V.:1536.9; NG/GT:725.3; IV Piggyback:350] Out: 1825 [Urine:1825]   Objective:  Vital signs in last 24 hours:  Temp:  [99.1 F (37.3 C)-101.5 F (38.6 C)] 101.5 F (38.6 C) (04/10 0700) Pulse Rate:  [63-202] 78 (04/10 0700) Resp:  [10-27] 17 (04/10 0700) BP: (85-133)/(54-121) 125/77 (04/10 0700) SpO2:  [86 %-100 %] 95 % (04/10 0750) Arterial Line BP: (71-131)/(50-100) 103/90 (04/10 0700) FiO2 (%):  [90 %-100 %] 90 % (04/10 0750) Weight:  [188.3 kg] 188.3 kg (04/10 0319)  Weight change: 0.1 kg Filed Weights   10/31/23 1949 11/01/23 0443 11/02/23 0319  Weight: (!) 188.2 kg (!) 188.2 kg (!) 188.3 kg    Intake/Output: I/O last 3 completed shifts: In: 4816.6 [I.V.:3050.2; NG/GT:725.3; IV Piggyback:1041.1] Out: 2145 [Urine:2075; Emesis/NG output:70]   Intake/Output this shift:  No intake/output data recorded.  Physical Exam: General: Critically ill-appearing  Head: Normocephalic, atraumatic.  Endotracheal tube in place  Neck: Supple  Lungs:  Scattered rhonchi Vent assisted  Heart: S1S2 no rubs  Abdomen:  Soft, nontender, bowel sounds present  Extremities: 1+ peripheral edema.  Neurologic: Awake, alert, following commands  Skin: No acute rash  Access: No hemodialysis access    Basic Metabolic Panel: Recent Labs  Lab 10/30/23 2348 10/31/23 0616 11/01/23 0507 11/01/23 1409 11/02/23 0507  NA 136 130* 132* 134* 137  K 4.5 4.4 5.2* 4.7 4.5  CL 95* 96* 99 99 99  CO2 33* 22 23 27 27   GLUCOSE 230* 272* 365* 287* 254*  BUN 19 19 38* 40* 39*  CREATININE 1.09 1.31* 2.35* 1.87* 1.63*  CALCIUM 8.2* 8.0* 7.8* 7.8* 8.5*  MG  --  2.1 2.4  --  2.7*  PHOS  --  2.2*  5.0*  --  3.5    Liver Function Tests: Recent Labs  Lab 10/30/23 2348 11/01/23 0507  AST 37  --   ALT 46*  --   ALKPHOS 61  --   BILITOT 0.9  --   PROT 9.0*  --   ALBUMIN 3.7 2.9*   No results for input(s): "LIPASE", "AMYLASE" in the last 168 hours. No results for input(s): "AMMONIA" in the last 168 hours.  CBC: Recent Labs  Lab 10/30/23 2348 11/01/23 0507 11/02/23 0507  WBC 15.3* 16.7* 12.3*  NEUTROABS 10.1*  --   --   HGB 15.0 14.1 13.8  HCT 54.2* 49.7 48.1  MCV 95.1 92.2 90.4  PLT 195 179 177    Cardiac Enzymes: No results for input(s): "CKTOTAL", "CKMB", "CKMBINDEX", "TROPONINI" in the last 168 hours.  BNP: Invalid input(s): "POCBNP"  CBG: Recent Labs  Lab 11/01/23 1701 11/01/23 1959 11/01/23 2337 11/02/23 0347 11/02/23 0738  GLUCAP 223* 193* 211* 229* 242*    Microbiology: Results for orders placed or performed during the hospital encounter of 10/30/23  Blood culture (routine x 2)     Status: None (Preliminary result)   Collection Time: 10/30/23 11:50 PM   Specimen: BLOOD  Result Value Ref Range Status   Specimen Description BLOOD BLOOD RIGHT HAND  Final   Special Requests   Final    BOTTLES DRAWN AEROBIC AND ANAEROBIC Blood Culture adequate volume   Culture   Final  NO GROWTH 2 DAYS Performed at [facility], [address] [place], [place] [address]    Report Status PENDING  Incomplete  Resp panel by RT-PCR (RSV, Flu A&B, Covid) Anterior Nasal Swab     Status: None   Collection Time: 10/31/23 12:33 AM   Specimen: Anterior Nasal Swab  Result Value Ref Range Status   SARS Coronavirus 2 by RT PCR NEGATIVE NEGATIVE Final    Comment: (NOTE) SARS-CoV-2 target nucleic acids are NOT DETECTED.  The SARS-CoV-2 RNA is generally detectable in upper respiratory specimens during the acute phase of infection. The lowest concentration of SARS-CoV-2 viral copies this assay can detect is 138 copies/mL. A negative result does not preclude  SARS-Cov-2 infection and should not be used as the sole basis for treatment or other patient management decisions. A negative result may occur with  improper specimen collection/handling, submission of specimen other than nasopharyngeal swab, presence of viral mutation(s) within the areas targeted by this assay, and inadequate number of viral copies(<138 copies/mL). A negative result must be combined with clinical observations, patient history, and epidemiological information. The expected result is Negative.  Fact Sheet for Patients:  BloggerCourse.com  Fact Sheet for Healthcare Providers:  SeriousBroker.it  This test is no t yet approved or cleared by the [place]  FDA and  has been authorized for detection and/or diagnosis of SARS-CoV-2 by FDA under an Emergency Use Authorization (EUA). This EUA will remain  in effect (meaning this test can be used) for the duration of the COVID-19 declaration under Section 564(b)(1) of the Act, 21 U.S.C.section 360bbb-3(b)(1), unless the authorization is terminated  or revoked sooner.       Influenza A by PCR NEGATIVE NEGATIVE Final   Influenza B by PCR NEGATIVE NEGATIVE Final    Comment: (NOTE) The Xpert Xpress SARS-CoV-2/FLU/RSV plus assay is intended as an aid in the diagnosis of influenza from Nasopharyngeal swab specimens and should not be used as a sole basis for treatment. Nasal washings and aspirates are unacceptable for Xpert Xpress SARS-CoV-2/FLU/RSV testing.  Fact Sheet for Patients: BloggerCourse.com  Fact Sheet for Healthcare Providers: SeriousBroker.it  This test is not yet approved or cleared by the [place]  FDA and has been authorized for detection and/or diagnosis of SARS-CoV-2 by FDA under an Emergency Use Authorization (EUA). This EUA will remain in effect (meaning this test can be used) for the duration of  the COVID-19 declaration under Section 564(b)(1) of the Act, 21 U.S.C. section 360bbb-3(b)(1), unless the authorization is terminated or revoked.     Resp Syncytial Virus by PCR NEGATIVE NEGATIVE Final    Comment: (NOTE) Fact Sheet for Patients: BloggerCourse.com  Fact Sheet for Healthcare Providers: SeriousBroker.it  This test is not yet approved or cleared by the [place]  FDA and has been authorized for detection and/or diagnosis of SARS-CoV-2 by FDA under an Emergency Use Authorization (EUA). This EUA will remain in effect (meaning this test can be used) for the duration of the COVID-19 declaration under Section 564(b)(1) of the Act, 21 U.S.C. section 360bbb-3(b)(1), unless the authorization is terminated or revoked.  Performed at [facility], [address] [place], [place] [address]   Blood culture (routine x 2)     Status: None (Preliminary result)   Collection Time: 10/31/23  1:36 AM   Specimen: BLOOD  Result Value Ref Range Status   Specimen Description   Final    BLOOD RIGHT ANTECUBITAL Performed at [facility], [address], [place], [place]  [address]    Special Requests   Final    BOTTLES DRAWN AEROBIC AND ANAEROBIC Blood Culture results may not be optimal due to an inadequate volume of blood received in culture bottles Performed at [facility], [address] [place], [place] [address]    Culture  Setup Time   Final    IN BOTH AEROBIC AND ANAEROBIC BOTTLES GRAM POSITIVE COCCI CALLED TO ANGELA DOBBS 1631 10/31/23 LRL    Culture   Final    GRAM POSITIVE COCCI TOO Danh TO READ Performed at [facility], [address], [place], [place] [address]    Report Status PENDING  Incomplete  Blood Culture ID Panel (Reflexed)     Status: Abnormal   Collection Time: 10/31/23  1:36 AM  Result Value Ref Range Status   Enterococcus faecalis NOT DETECTED NOT DETECTED Final    Enterococcus Faecium NOT DETECTED NOT DETECTED Final   Listeria monocytogenes NOT DETECTED NOT DETECTED Final   Staphylococcus species DETECTED (A) NOT DETECTED Corrected    Comment: CRITICAL RESULT CALLED TO, READ BACK BY AND VERIFIED WITH: CALLED TO ANGELA DOBBS 1631 10/31/23 LRL CORRECTED ON 04/09 AT 0852: PREVIOUSLY REPORTED AS DETECTED CALLED TO ANGELA DOBBS 1631 10/31/23 LRL    Staphylococcus aureus (BCID) NOT DETECTED NOT DETECTED Final   Staphylococcus epidermidis DETECTED (A) NOT DETECTED Corrected    Comment: CRITICAL RESULT CALLED TO, READ BACK BY AND VERIFIED WITH: CALLED TO ANGELA DOBBS 1631 10/31/23 LRL CORRECTED ON 04/09 AT 0852: PREVIOUSLY REPORTED AS DETECTED CALLED TO ANGELA DOBBS 1631 10/31/23 LRL    Staphylococcus lugdunensis NOT DETECTED NOT DETECTED Final   Streptococcus species NOT DETECTED NOT DETECTED Final   Streptococcus agalactiae NOT DETECTED NOT DETECTED Final   Streptococcus pneumoniae NOT DETECTED NOT DETECTED Final   Streptococcus pyogenes NOT DETECTED NOT DETECTED Final   A.calcoaceticus-baumannii NOT DETECTED NOT DETECTED Final   Bacteroides fragilis NOT DETECTED NOT DETECTED Final   Enterobacterales NOT DETECTED NOT DETECTED Final   Enterobacter cloacae complex NOT DETECTED NOT DETECTED Final   Escherichia coli NOT DETECTED NOT DETECTED Final   Klebsiella aerogenes NOT DETECTED NOT DETECTED Final   Klebsiella oxytoca NOT DETECTED NOT DETECTED Final   Klebsiella pneumoniae NOT DETECTED NOT DETECTED Final   Proteus species NOT DETECTED NOT DETECTED Final   Salmonella species NOT DETECTED NOT DETECTED Final   Serratia marcescens NOT DETECTED NOT DETECTED Final   Haemophilus influenzae NOT DETECTED NOT DETECTED Final   Neisseria meningitidis NOT DETECTED NOT DETECTED Final   Pseudomonas aeruginosa NOT DETECTED NOT DETECTED Final   Stenotrophomonas maltophilia NOT DETECTED NOT DETECTED Final   Candida albicans NOT DETECTED NOT DETECTED Final   Candida  auris NOT DETECTED NOT DETECTED Final   Candida glabrata NOT DETECTED NOT DETECTED Final   Candida krusei NOT DETECTED NOT DETECTED Final   Candida parapsilosis NOT DETECTED NOT DETECTED Final   Candida tropicalis NOT DETECTED NOT DETECTED Final   Cryptococcus neoformans/gattii NOT DETECTED NOT DETECTED Final   Methicillin resistance mecA/C NOT DETECTED NOT DETECTED Final    Comment: Performed at [facility], [address] [place], [place] [address]  Respiratory (~20 pathogens) panel by PCR     Status: None   Collection Time: 10/31/23 12:07 PM   Specimen: Nasopharyngeal Swab; Respiratory  Result Value Ref Range Status   Adenovirus NOT DETECTED NOT DETECTED Final   Coronavirus 229E NOT DETECTED NOT DETECTED Final    Comment: (NOTE) The Coronavirus on the Respiratory  Panel, DOES NOT test for the novel  Coronavirus (2019 nCoV)    Coronavirus HKU1 NOT DETECTED NOT DETECTED Final   Coronavirus NL63 NOT DETECTED NOT DETECTED Final   Coronavirus OC43 NOT DETECTED NOT DETECTED Final   Metapneumovirus NOT DETECTED NOT DETECTED Final   Rhinovirus / Enterovirus NOT DETECTED NOT DETECTED Final   Influenza A NOT DETECTED NOT DETECTED Final   Influenza B NOT DETECTED NOT DETECTED Final   Parainfluenza Virus 1 NOT DETECTED NOT DETECTED Final   Parainfluenza Virus 2 NOT DETECTED NOT DETECTED Final   Parainfluenza Virus 3 NOT DETECTED NOT DETECTED Final   Parainfluenza Virus 4 NOT DETECTED NOT DETECTED Final   Respiratory Syncytial Virus NOT DETECTED NOT DETECTED Final   Bordetella pertussis NOT DETECTED NOT DETECTED Final   Bordetella Parapertussis NOT DETECTED NOT DETECTED Final   Chlamydophila pneumoniae NOT DETECTED NOT DETECTED Final   Mycoplasma pneumoniae NOT DETECTED NOT DETECTED Final    Comment: Performed at [facility], [address] [place], [place] [address]  Culture, Respiratory w Gram Stain     Status: None (Preliminary result)   Collection Time: 10/31/23  4:53  PM   Specimen: Tracheal Aspirate; Respiratory  Result Value Ref Range Status   Specimen Description   Final    TRACHEAL ASPIRATE Performed at [facility], [address] [place], [place] [address]    Special Requests   Final    NONE Performed at [facility], [address] [place], [place] [address]    Gram Stain   Final    NO WBC SEEN RARE Hillis Lu POSITIVE COCCI RARE GRAM POSITIVE RODS    Culture   Final    TOO Fotopoulos TO READ Performed at [facility], [address] [place], [place] [address]    Report Status PENDING  Incomplete  MRSA Next Gen by PCR, Nasal     Status: None   Collection Time: 11/01/23  6:17 PM   Specimen: Nasal Mucosa; Nasal Swab  Result Value Ref Range Status   MRSA by PCR Next Gen NOT DETECTED NOT DETECTED Final    Comment: (NOTE) The GeneXpert MRSA Assay (FDA approved for NASAL specimens only), is one component of a comprehensive MRSA colonization surveillance program. It is not intended to diagnose MRSA infection nor to guide or monitor treatment for MRSA infections. Test performance is not FDA approved in patients less than 42 years old. Performed at [facility], [address] [place], [place] [address]     Coagulation Studies: No results for input(s): "LABPROT", "INR" in the last 72 hours.  Urinalysis: No results for input(s): "COLORURINE", "LABSPEC", "PHURINE", "GLUCOSEU", "HGBUR", "BILIRUBINUR", "KETONESUR", "PROTEINUR", "UROBILINOGEN", "NITRITE", "LEUKOCYTESUR" in the last 72 hours.  Invalid input(s): "APPERANCEUR"    Imaging: DG Chest Port 1 View Result Date: 11/01/2023 CLINICAL DATA:  Status post PICC line placement. EXAM: PORTABLE CHEST 1 VIEW COMPARISON:  10/31/2023 FINDINGS: The endotracheal tube tip is approximately 4.5 cm above the carina. There is a new right IJ catheter with tip in the projection of the SVC. No pneumothorax identified. No PICC line identified. Enteric tube tip courses below the  level of the GE junction. Cardiac enlargement. Atelectasis/consolidation is again noted in both lung bases. IMPRESSION: 1. New right IJ catheter with tip in the projection of the SVC. No pneumothorax. 2. No PICC line identified. 3. Persistent bibasilar atelectasis/consolidation. Electronically Signed   By: Kimberley Penman M.D.   On: 11/01/2023 06:55   DG  Chest Port 1 View Result Date: 10/31/2023 CLINICAL DATA:  Endotracheal tube present. EXAM: PORTABLE CHEST 1 VIEW COMPARISON:  Earlier today FINDINGS: Endotracheal tube tip is at the level of the clavicular heads approximately 5.4 cm from the carina. Lung volumes are low with increasing bibasilar volume loss. Enteric tube is faintly visualized to the level of the lower chest. Cardiomegaly is grossly unchanged. IMPRESSION: 1. Endotracheal tube tip at the level of the clavicular heads approximately 5.4 cm from the carina. 2. Low lung volumes with increasing bibasilar volume loss. Electronically Signed   By: Chadwick Colonel M.D.   On: 10/31/2023 18:31     Medications:    azithromycin Stopped (11/01/23 9811)   cefTRIAXone (ROCEPHIN)  IV Stopped (11/01/23 2034)   dexmedetomidine (PRECEDEX) IV infusion 1.2 mcg/kg/hr (11/02/23 0719)   feeding supplement (VITAL HIGH PROTEIN) 35 mL/hr at 11/02/23 0700   norepinephrine (LEVOPHED) Adult infusion Stopped (11/02/23 0615)    acetaZOLAMIDE  500 mg Intravenous Once   budesonide (PULMICORT) nebulizer solution  0.25 mg Nebulization BID   Chlorhexidine Gluconate Cloth  6 each Topical Daily   docusate  100 mg Per Tube BID   free water  30 mL Per Tube Q4H   heparin  5,000 Units Subcutaneous Q8H   insulin aspart  0-20 Units Subcutaneous Q4H   insulin glargine-yfgn  16 Units Subcutaneous BID   ipratropium-albuterol  3 mL Nebulization Q6H   methylPREDNISolone (SOLU-MEDROL) injection  40 mg Intravenous Q12H   mouth rinse  15 mL Mouth Rinse Q2H   pantoprazole (PROTONIX) IV  40 mg Intravenous Q24H   polyethylene  glycol  17 g Per Tube Daily   sodium chloride flush  10-40 mL Intracatheter Q12H   sterile water (preservative free)       acetaminophen, docusate, fentaNYL (SUBLIMAZE) injection, ipratropium-albuterol, midazolam, mouth rinse, polyethylene glycol, sodium chloride flush, sterile water (preservative free)  Assessment/ Plan:  43 y.o. male with a PMHx of severe obstructive sleep apnea, chronic respiratory load on 4 L of oxygen, extreme obesity, diabetes mellitus type 2, hypertension, who was admitted to [facility] on 10/30/2023 for evaluation of significant shortness of breath.   1.  Acute kidney injury.  Acute kidney injury likely multifactorial with contribution from contrast exposure as well as hypotension.  Suspect he most likely has some element of ATN. Update: Urine output noted.  Creatinine down to 1.6.  No immediate need for dialysis.  Avoid nephrotoxins as possible.  2.  Acute respiratory failure.  Remains on vent support.  Weaning as per pulmonary/medical care.  3.  Hypotension.  Norepinephrine stopped.  Monitor blood pressure closely.  4.  Hyponatremia.  Improved.  Serum sodium up to 137.   LOS: 2 Amylah Will 4/10/20257:53 AM

## 2023-11-02 NOTE — Progress Notes (Signed)
 Patient's family notified Nursing to take a look at patient, because " there is something wrong with his eye". Patient left-eye found to be out of socket, MD notified and at bedside assessed the eye. Eye gently pushed back into the socket by provider using sterile techniques and covered with a guaze dressing for the night.

## 2023-11-02 NOTE — Inpatient Diabetes Management (Signed)
 Inpatient Diabetes Program Recommendations  AACE/ADA: New Consensus Statement on Inpatient Glycemic Control   Target Ranges:  Prepandial:   less than 140 mg/dL      Peak postprandial:   less than 180 mg/dL (1-2 hours)      Critically ill patients:  140 - 180 mg/dL    Latest Reference Range & Units 11/02/23 03:47 11/02/23 07:38  Glucose-Capillary 70 - 99 mg/dL 161 (H) 096 (H)    Latest Reference Range & Units 11/01/23 03:59 11/01/23 07:53 11/01/23 11:26 11/01/23 17:01 11/01/23 19:59 11/01/23 23:37  Glucose-Capillary 70 - 99 mg/dL 045 (H) 409 (H) 811 (H) 223 (H) 193 (H) 211 (H)   Review of Glycemic Control  Diabetes history: DM2 Outpatient Diabetes medications: Metformin 1000 mg BID Current orders for Inpatient glycemic control: Semglee 16 units BID, Novolog 0-20 units Q4H; Solumedrol 40 mg Q12H, Vital @ 75 ml/hr   Inpatient Diabetes Program Recommendations:    Insulin: Noted Semglee increased from 14 units BID to 16 units BID this morning.  Please consider ordering Novolog 5 units Q4H for tube feeding coverage. If tube feeding is stopped or held then Novolog tube feeding coverage should also be stopped or held.  Thanks, Beacher Limerick, RN, MSN, CDCES Diabetes Coordinator Inpatient Diabetes Program [phone] (Team Pager from 8am to 5pm)

## 2023-11-02 NOTE — Plan of Care (Addendum)
  Called to Bedside to evaluate for acute LEFT eye PROPTOSIS Patient did NOT have any blindness, was able to see how many fingers I was holding up, extra Ocular muscles intact, no visual disturbances, no pain. No pus.   Saline was used to lubricate eye ball and then with sterile gloves, I pushed the eye ball back into the socket and placed gauze. Patient tolerated the procedure really well without any issues.     Lady Pier, M.D.  [facility]  Medical Director [facility] Medical Director [facility] Cardio-Pulmonary Department

## 2023-11-02 NOTE — Progress Notes (Signed)
   11/02/23 1600  Spiritual Encounters  Type of Visit Initial  Care provided to: Pt and family  Referral source Non-clinical staff  Reason for visit Routine spiritual support  OnCall Visit No  Interventions  Spiritual Care Interventions Made Established relationship of care and support;Compassionate presence;Reflective listening;Prayer  Intervention Outcomes  Outcomes Connection to spiritual care;Awareness of support  Spiritual Care Plan  Spiritual Care Issues Still Outstanding No further spiritual care needs at this time (see row info)   Chaplain spiritual support services remain available as the need arises.

## 2023-11-03 DIAGNOSIS — J9601 Acute respiratory failure with hypoxia: Secondary | ICD-10-CM | POA: Diagnosis not present

## 2023-11-03 DIAGNOSIS — A419 Sepsis, unspecified organism: Secondary | ICD-10-CM | POA: Diagnosis not present

## 2023-11-03 DIAGNOSIS — J9602 Acute respiratory failure with hypercapnia: Secondary | ICD-10-CM | POA: Diagnosis not present

## 2023-11-03 DIAGNOSIS — J9622 Acute and chronic respiratory failure with hypercapnia: Secondary | ICD-10-CM | POA: Diagnosis not present

## 2023-11-03 DIAGNOSIS — J9621 Acute and chronic respiratory failure with hypoxia: Secondary | ICD-10-CM | POA: Diagnosis not present

## 2023-11-03 DIAGNOSIS — R6521 Severe sepsis with septic shock: Secondary | ICD-10-CM | POA: Diagnosis not present

## 2023-11-03 LAB — BASIC METABOLIC PANEL WITH GFR
Anion gap: 9 (ref 5–15)
BUN: 35 mg/dL — ABNORMAL HIGH (ref 6–20)
CO2: 24 mmol/L (ref 22–32)
Calcium: 8.6 mg/dL — ABNORMAL LOW (ref 8.9–10.3)
Chloride: 102 mmol/L (ref 98–111)
Creatinine, Ser: 1.36 mg/dL — ABNORMAL HIGH (ref 0.61–1.24)
GFR, Estimated: >60 mL/min (ref >60)
Glucose, Bld: 315 mg/dL — ABNORMAL HIGH (ref 70–99)
Potassium: 4.6 mmol/L (ref 3.5–5.1)
Sodium: 135 mmol/L (ref 135–145)

## 2023-11-03 LAB — CBC
HCT: 47.6 % (ref 39.0–52.0)
Hemoglobin: 13.5 g/dL (ref 13.0–17.0)
MCH: 26.5 pg (ref 26.0–34.0)
MCHC: 28.4 g/dL — ABNORMAL LOW (ref 30.0–36.0)
MCV: 93.3 fL (ref 80.0–100.0)
Platelets: 153 10*3/uL (ref 150–400)
RBC: 5.1 MIL/uL (ref 4.22–5.81)
RDW: 15.4 % (ref 11.5–15.5)
WBC: 11.4 10*3/uL — ABNORMAL HIGH (ref 4.0–10.5)
nRBC: 0.2 % (ref 0.0–0.2)

## 2023-11-03 LAB — GLUCOSE, CAPILLARY
Glucose-Capillary: 300 mg/dL — ABNORMAL HIGH (ref 70–99)
Glucose-Capillary: 221 mg/dL — ABNORMAL HIGH (ref 70–99)
Glucose-Capillary: 298 mg/dL — ABNORMAL HIGH (ref 70–99)
Glucose-Capillary: 247 mg/dL — ABNORMAL HIGH (ref 70–99)
Glucose-Capillary: 196 mg/dL — ABNORMAL HIGH (ref 70–99)
Glucose-Capillary: 224 mg/dL — ABNORMAL HIGH (ref 70–99)

## 2023-11-03 LAB — PHOSPHORUS: Phosphorus: 3.5 mg/dL (ref 2.5–4.6)

## 2023-11-03 LAB — TSH: TSH: 0.954 u[IU]/mL (ref 0.350–4.500)

## 2023-11-03 LAB — MAGNESIUM: Magnesium: 2.6 mg/dL — ABNORMAL HIGH (ref 1.7–2.4)

## 2023-11-03 LAB — T4, FREE: Free T4: 0.75 ng/dL (ref 0.61–1.12)

## 2023-11-03 LAB — CULTURE, RESPIRATORY W GRAM STAIN
Culture: NORMAL
Gram Stain: NONE SEEN

## 2023-11-03 MED ORDER — METHYLPREDNISOLONE SODIUM SUCC 40 MG IJ SOLR
40.0000 mg | INTRAMUSCULAR | Status: DC
  Administered 2023-11-04 – 2023-11-07 (×4): 40 mg via INTRAVENOUS
  Filled 2023-11-03 (×4): qty 1

## 2023-11-03 MED ORDER — ACETAZOLAMIDE SODIUM 500 MG IJ SOLR
500.0000 mg | Freq: Once | INTRAMUSCULAR | Status: AC
  Administered 2023-11-03: 500 mg via INTRAVENOUS
  Filled 2023-11-03: qty 500

## 2023-11-03 MED ORDER — SODIUM CHLORIDE 0.9 % IV SOLN
2.0000 g | INTRAVENOUS | Status: AC
  Administered 2023-11-03 – 2023-11-04 (×2): 2 g via INTRAVENOUS
  Filled 2023-11-03 (×2): qty 20

## 2023-11-03 MED ORDER — INSULIN ASPART 100 UNIT/ML IJ SOLN
8.0000 [IU] | INTRAMUSCULAR | Status: DC
  Administered 2023-11-03 – 2023-11-04 (×6): 8 [IU] via SUBCUTANEOUS
  Filled 2023-11-03 (×7): qty 1

## 2023-11-03 NOTE — Progress Notes (Signed)
 NAME:  Clifford Nichols, MRN:  [id number], DOB:  1980-08-20, LOS: 3 ADMISSION DATE:  10/30/2023, CONSULTATION DATE:  10/31/23 REFERRING MD:  Verneda Golder   CHIEF COMPLAINT:  shortness of breath    HPI  43 y.o with significant PMH of  severe OSA, chronic respiraotry failure on 4L, extreme obesity with Alveolar hypoventilation, DM and HTN who presented to the ED with chief complaints of progressive shortness of breath.  Per ED reports, EMS was called for worsening respiratory distress. On EMS arrival, patient was found with oxygen saturation in the 80's on his normal 4L. He was placed on NRB and then transitioned to CPAP. He was treated with Duonebs and IV solumedrol enroute, shortly after he became unresponsive.   ED Course: Initial vital signs showed HR of 96 beats/minute, BP 136/47mm Hg, the RR 28breaths/minute, and the oxygen saturation 83% on CPAP and a temperature of 101.74F (38.4C). Patient was lethargic, moaned intermittently, and responded with one-word answers; symmetric movement in the arms and legs was observed. Due to worsening mental status on CPAP he was intubated for airway protection.  Pertinent Labs/Diagnostics Findings: Na+/ K+: 136/4.5 Glucose: 230  WBC: 15.3K/L with bands or neutrophil predominance    PCT: 0.27 COVID PCR: Negative,  troponin: 95  BNP: 243 ABG: pO2 59; pCO2 83; pH 7.23;  HCO3 34.8, %O2 Sat 86.2.  CXR>  CTA Chest> see results below Patient given 30 cc/kg of fluids and started on broad-spectrum antibiotics Vanco cefepime and Flagyl for sepsis with septic shock. Patient remained hypotensive despite IVF boluses therefore was started on Levophed. PCCM consulted.   Past Medical History  severe OSA, chronic respiraotry failure on 4L, extreme obesity with Alveolar hypoventilation, DM and HTN  Significant Hospital Events   4/8: Admit to ICU with acute on chronic hypoxic hypercapnic respiratory failure in the setting of pneumonia and alveolar hypoventilation   4/9 severe Hypoxia, CVl, ART line placed 4/10 severe hypoxia Po2 in 80's, acute LEFT EYE PROPTOSIS 4/11 severe hypoxia, remains on vent  Consults:  None  Procedures:  4/8: Ablation  Significant Diagnostic Tests:  4/8: Chest Xray> IMPRESSION: Low lung volumes with mild right basilar atelectasis and/or infiltrate.  4/8: CTA Chest, abdomen and pelvis> IMPRESSION: 1. Limited study without evidence of pulmonary embolism. 2. Marked severity areas of bilateral lower lobe scarring, atelectasis and/or infiltrate. 3. Mild right middle lobe atelectatic changes. 4. Properly positioned endotracheal and nasogastric tubes.  Interim History / Subjective:    Remains critically ill Remains intubated Severe hypoxia Requires VENT support for survival  Vent Mode: PRVC FiO2 (%):  [50 %-90 %] 50 % Set Rate:  [24 bmp] 24 bmp Vt Set:  [540 mL] 540 mL PEEP:  [12 cmH20-16 cmH20] 12 cmH20 Plateau Pressure:  [28 cmH20] 28 cmH20    Micro Data:  4/8: SARS-CoV-2 PCR> negative 4/8: Influenza PCR> negative 4/8: Blood culture x2> 4/8: MRSA PCR>>  4/8: Strep pneumo urinary antigen> 4/8: Legionella urinary antigen> Results for orders placed or performed during the hospital encounter of 10/30/23  Blood culture (routine x 2)     Status: None (Preliminary result)   Collection Time: 10/30/23 11:50 PM   Specimen: BLOOD  Result Value Ref Range Status   Specimen Description BLOOD BLOOD RIGHT HAND  Final   Special Requests   Final    BOTTLES DRAWN AEROBIC AND ANAEROBIC Blood Culture adequate volume   Culture   Final    NO GROWTH 3 DAYS Performed at [facility], [address] [place], [place] [address]    Report Status PENDING  Incomplete  Resp panel by RT-PCR (RSV, Flu A&B, Covid) Anterior Nasal Swab     Status: None   Collection Time: 10/31/23 12:33 AM   Specimen: Anterior Nasal Swab  Result Value Ref Range Status   SARS Coronavirus 2 by RT PCR NEGATIVE NEGATIVE Final    Comment:  (NOTE) SARS-CoV-2 target nucleic acids are NOT DETECTED.  The SARS-CoV-2 RNA is generally detectable in upper respiratory specimens during the acute phase of infection. The lowest concentration of SARS-CoV-2 viral copies this assay can detect is 138 copies/mL. A negative result does not preclude SARS-Cov-2 infection and should not be used as the sole basis for treatment or other patient management decisions. A negative result may occur with  improper specimen collection/handling, submission of specimen other than nasopharyngeal swab, presence of viral mutation(s) within the areas targeted by this assay, and inadequate number of viral copies(<138 copies/mL). A negative result must be combined with clinical observations, patient history, and epidemiological information. The expected result is Negative.  Fact Sheet for Patients:  BloggerCourse.com  Fact Sheet for Healthcare Providers:  SeriousBroker.it  This test is no t yet approved or cleared by the [place]  FDA and  has been authorized for detection and/or diagnosis of SARS-CoV-2 by FDA under an Emergency Use Authorization (EUA). This EUA will remain  in effect (meaning this test can be used) for the duration of the COVID-19 declaration under Section 564(b)(1) of the Act, 21 U.S.C.section 360bbb-3(b)(1), unless the authorization is terminated  or revoked sooner.       Influenza A by PCR NEGATIVE NEGATIVE Final   Influenza B by PCR NEGATIVE NEGATIVE Final    Comment: (NOTE) The Xpert Xpress SARS-CoV-2/FLU/RSV plus assay is intended as an aid in the diagnosis of influenza from Nasopharyngeal swab specimens and should not be used as a sole basis for treatment. Nasal washings and aspirates are unacceptable for Xpert Xpress SARS-CoV-2/FLU/RSV testing.  Fact Sheet for Patients: BloggerCourse.com  Fact Sheet for Healthcare  Providers: SeriousBroker.it  This test is not yet approved or cleared by the [place]  FDA and has been authorized for detection and/or diagnosis of SARS-CoV-2 by FDA under an Emergency Use Authorization (EUA). This EUA will remain in effect (meaning this test can be used) for the duration of the COVID-19 declaration under Section 564(b)(1) of the Act, 21 U.S.C. section 360bbb-3(b)(1), unless the authorization is terminated or revoked.     Resp Syncytial Virus by PCR NEGATIVE NEGATIVE Final    Comment: (NOTE) Fact Sheet for Patients: BloggerCourse.com  Fact Sheet for Healthcare Providers: SeriousBroker.it  This test is not yet approved or cleared by the [place]  FDA and has been authorized for detection and/or diagnosis of SARS-CoV-2 by FDA under an Emergency Use Authorization (EUA). This EUA will remain in effect (meaning this test can be used) for the duration of the COVID-19 declaration under Section 564(b)(1) of the Act, 21 U.S.C. section 360bbb-3(b)(1), unless the authorization is terminated or revoked.  Performed at [facility], [address] [place], [place] [address]   Blood culture (routine x 2)     Status: Abnormal (Preliminary result)   Collection Time: 10/31/23  1:36 AM   Specimen: BLOOD  Result Value Ref Range Status   Specimen Description   Final    BLOOD RIGHT ANTECUBITAL Performed at [facility], [address], [place], [place] [address]    Special Requests   Final  BOTTLES DRAWN AEROBIC AND ANAEROBIC Blood Culture results may not be optimal due to an inadequate volume of blood received in culture bottles Performed at [facility], [address] [place], [place] [address]    Culture  Setup Time   Final    IN BOTH AEROBIC AND ANAEROBIC BOTTLES GRAM POSITIVE COCCI CALLED TO ANGELA DOBBS 1631 10/31/23 LRL    Culture (A)  Final     STAPHYLOCOCCUS HOMINIS THE SIGNIFICANCE OF ISOLATING THIS ORGANISM FROM A SINGLE SET OF BLOOD CULTURES WHEN MULTIPLE SETS ARE DRAWN IS UNCERTAIN. PLEASE NOTIFY THE MICROBIOLOGY DEPARTMENT WITHIN ONE WEEK IF SPECIATION AND SENSITIVITIES ARE REQUIRED. CULTURE REINCUBATED FOR BETTER GROWTH Performed at [facility], [address] [place], [place] [address]    Report Status PENDING  Incomplete  Blood Culture ID Panel (Reflexed)     Status: Abnormal   Collection Time: 10/31/23  1:36 AM  Result Value Ref Range Status   Enterococcus faecalis NOT DETECTED NOT DETECTED Final   Enterococcus Faecium NOT DETECTED NOT DETECTED Final   Listeria monocytogenes NOT DETECTED NOT DETECTED Final   Staphylococcus species DETECTED (A) NOT DETECTED Corrected    Comment: CRITICAL RESULT CALLED TO, READ BACK BY AND VERIFIED WITH: CALLED TO ANGELA DOBBS 1631 10/31/23 LRL CORRECTED ON 04/09 AT 0852: PREVIOUSLY REPORTED AS DETECTED CALLED TO ANGELA DOBBS 1631 10/31/23 LRL    Staphylococcus aureus (BCID) NOT DETECTED NOT DETECTED Final   Staphylococcus epidermidis DETECTED (A) NOT DETECTED Corrected    Comment: CRITICAL RESULT CALLED TO, READ BACK BY AND VERIFIED WITH: CALLED TO ANGELA DOBBS 1631 10/31/23 LRL CORRECTED ON 04/09 AT 0852: PREVIOUSLY REPORTED AS DETECTED CALLED TO ANGELA DOBBS 1631 10/31/23 LRL    Staphylococcus lugdunensis NOT DETECTED NOT DETECTED Final   Streptococcus species NOT DETECTED NOT DETECTED Final   Streptococcus agalactiae NOT DETECTED NOT DETECTED Final   Streptococcus pneumoniae NOT DETECTED NOT DETECTED Final   Streptococcus pyogenes NOT DETECTED NOT DETECTED Final   A.calcoaceticus-baumannii NOT DETECTED NOT DETECTED Final   Bacteroides fragilis NOT DETECTED NOT DETECTED Final   Enterobacterales NOT DETECTED NOT DETECTED Final   Enterobacter cloacae complex NOT DETECTED NOT DETECTED Final   Escherichia coli NOT DETECTED NOT DETECTED Final   Klebsiella aerogenes NOT  DETECTED NOT DETECTED Final   Klebsiella oxytoca NOT DETECTED NOT DETECTED Final   Klebsiella pneumoniae NOT DETECTED NOT DETECTED Final   Proteus species NOT DETECTED NOT DETECTED Final   Salmonella species NOT DETECTED NOT DETECTED Final   Serratia marcescens NOT DETECTED NOT DETECTED Final   Haemophilus influenzae NOT DETECTED NOT DETECTED Final   Neisseria meningitidis NOT DETECTED NOT DETECTED Final   Pseudomonas aeruginosa NOT DETECTED NOT DETECTED Final   Stenotrophomonas maltophilia NOT DETECTED NOT DETECTED Final   Candida albicans NOT DETECTED NOT DETECTED Final   Candida auris NOT DETECTED NOT DETECTED Final   Candida glabrata NOT DETECTED NOT DETECTED Final   Candida krusei NOT DETECTED NOT DETECTED Final   Candida parapsilosis NOT DETECTED NOT DETECTED Final   Candida tropicalis NOT DETECTED NOT DETECTED Final   Cryptococcus neoformans/gattii NOT DETECTED NOT DETECTED Final   Methicillin resistance mecA/C NOT DETECTED NOT DETECTED Final    Comment: Performed at [facility], [address] [place], [place] [address]  Respiratory (~20 pathogens) panel by PCR     Status: None   Collection Time: 10/31/23 12:07 PM   Specimen: Nasopharyngeal Swab; Respiratory  Result Value Ref Range Status   Adenovirus NOT DETECTED  NOT DETECTED Final   Coronavirus 229E NOT DETECTED NOT DETECTED Final    Comment: (NOTE) The Coronavirus on the Respiratory Panel, DOES NOT test for the novel  Coronavirus (2019 nCoV)    Coronavirus HKU1 NOT DETECTED NOT DETECTED Final   Coronavirus NL63 NOT DETECTED NOT DETECTED Final   Coronavirus OC43 NOT DETECTED NOT DETECTED Final   Metapneumovirus NOT DETECTED NOT DETECTED Final   Rhinovirus / Enterovirus NOT DETECTED NOT DETECTED Final   Influenza A NOT DETECTED NOT DETECTED Final   Influenza B NOT DETECTED NOT DETECTED Final   Parainfluenza Virus 1 NOT DETECTED NOT DETECTED Final   Parainfluenza Virus 2 NOT DETECTED NOT DETECTED Final    Parainfluenza Virus 3 NOT DETECTED NOT DETECTED Final   Parainfluenza Virus 4 NOT DETECTED NOT DETECTED Final   Respiratory Syncytial Virus NOT DETECTED NOT DETECTED Final   Bordetella pertussis NOT DETECTED NOT DETECTED Final   Bordetella Parapertussis NOT DETECTED NOT DETECTED Final   Chlamydophila pneumoniae NOT DETECTED NOT DETECTED Final   Mycoplasma pneumoniae NOT DETECTED NOT DETECTED Final    Comment: Performed at [facility], [address] [place], [place] [address]  Culture, Respiratory w Gram Stain     Status: None (Preliminary result)   Collection Time: 10/31/23  4:53 PM   Specimen: Tracheal Aspirate; Respiratory  Result Value Ref Range Status   Specimen Description   Final    TRACHEAL ASPIRATE Performed at [facility], [address] [place], [place] [address]    Special Requests   Final    NONE Performed at [facility], [address] [place], [place] [address]    Gram Stain   Final    NO WBC SEEN RARE GRAM POSITIVE COCCI RARE GRAM POSITIVE RODS    Culture   Final    CULTURE REINCUBATED FOR BETTER GROWTH Performed at [facility], [address] [place], [place] [address]    Report Status PENDING  Incomplete  MRSA Next Gen by PCR, Nasal     Status: None   Collection Time: 11/01/23  6:17 PM   Specimen: Nasal Mucosa; Nasal Swab  Result Value Ref Range Status   MRSA by PCR Next Gen NOT DETECTED NOT DETECTED Final    Comment: (NOTE) The GeneXpert MRSA Assay (FDA approved for NASAL specimens only), is one component of a comprehensive MRSA colonization surveillance program. It is not intended to diagnose MRSA infection nor to guide or monitor treatment for MRSA infections. Test performance is not FDA approved in patients less than 45 years old. Performed at [facility], [address] [place], [place] [address]      Antimicrobials:  Vancomycin 4/8 x 1 Cefepime 4/8 x1 Azithromycin 4 /8> Ceftriaxon 4/8  > Metronidazole 4/8 x1  OBJECTIVE  Blood pressure (!) 136/90, pulse 73, temperature (!) 100.4 F (38 C), temperature source Bladder, resp. rate (!) 27, height 5\' 9"  (1.753 m), weight (!) 188.1 kg, SpO2 98%.    Vent Mode: PRVC FiO2 (%):  [50 %-90 %] 50 % Set Rate:  [24 bmp] 24 bmp Vt Set:  [540 mL] 540 mL PEEP:  [12 cmH20-16 cmH20] 12 cmH20 Plateau Pressure:  [28 cmH20] 28 cmH20   Intake/Output Summary (Last 24 hours) at 11/03/2023 0715 Last data filed at 11/03/2023 0600 Gross per 24 hour  Intake 3017.03 ml  Output 2900 ml  Net 117.03 ml   Filed Weights   11/01/23 0443 11/02/23 0319 11/03/23 0233  Weight: Aaron Aas)  188.2 kg (!) 188.3 kg (!) 188.1 kg      REVIEW OF SYSTEMS  PATIENT IS UNABLE TO PROVIDE COMPLETE REVIEW OF SYSTEMS DUE TO SEVERE CRITICAL ILLNESS   PHYSICAL EXAMINATION:  GENERAL:critically ill appearing EYES: Pupils equal, round, reactive to light.  No scleral icterus.  MOUTH: Moist mucosal membrane. INTUBATED NECK: Supple.  PULMONARY: Lungs clear to auscultation, +rhonchi CARDIOVASCULAR: S1 and S2.  Regular rate and rhythm GASTROINTESTINAL: Soft, nontender, -distended. Positive bowel sounds.  MUSCULOSKELETAL: No swelling, clubbing, or edema.  NEUROLOGIC: sedated SKIN:normal, warm to touch, Capillary refill delayed  Pulses present bilaterally     Labs/imaging that I havepersonally reviewed  (right click and "Reselect all SmartList Selections" daily)     Labs   CBC: Recent Labs  Lab 10/30/23 2348 11/01/23 0507 11/02/23 0507 11/03/23 0405  WBC 15.3* 16.7* 12.3* 11.4*  NEUTROABS 10.1*  --   --   --   HGB 15.0 14.1 13.8 13.5  HCT 54.2* 49.7 48.1 47.6  MCV 95.1 92.2 90.4 93.3  PLT 195 179 177 153    Basic Metabolic Panel: Recent Labs  Lab 10/31/23 0616 11/01/23 0507 11/01/23 1409 11/02/23 0507 11/03/23 0405  NA 130* 132* 134* 137 135  K 4.4 5.2* 4.7 4.5 4.6  CL 96* 99 99 99 102  CO2 22 23 27 27 24   GLUCOSE 272* 365* 287* 254* 315*  BUN  19 38* 40* 39* 35*  CREATININE 1.31* 2.35* 1.87* 1.63* 1.36*  CALCIUM 8.0* 7.8* 7.8* 8.5* 8.6*  MG 2.1 2.4  --  2.7* 2.6*  PHOS 2.2* 5.0*  --  3.5 3.5   GFR: Estimated Creatinine Clearance: 120.2 mL/min (A) (by C-G formula based on SCr of 1.36 mg/dL (H)). Recent Labs  Lab 10/30/23 2348 10/31/23 0136 10/31/23 0447 11/01/23 0507 11/02/23 0507 11/03/23 0405  PROCALCITON 0.27  --   --   --   --   --   WBC 15.3*  --   --  16.7* 12.3* 11.4*  LATICACIDVEN  --  1.3 1.8  --   --   --     Liver Function Tests: Recent Labs  Lab 10/30/23 2348 11/01/23 0507  AST 37  --   ALT 46*  --   ALKPHOS 61  --   BILITOT 0.9  --   PROT 9.0*  --   ALBUMIN 3.7 2.9*   No results for input(s): "LIPASE", "AMYLASE" in the last 168 hours. No results for input(s): "AMMONIA" in the last 168 hours.  ABG    Component Value Date/Time   PHART 7.41 11/02/2023 0500   PCO2ART 41 11/02/2023 0500   PO2ART 80 (L) 11/02/2023 0500   HCO3 26.0 11/02/2023 0500   ACIDBASEDEF 0.3 11/01/2023 0458   O2SAT 93.7 11/02/2023 0500     Coagulation Profile: No results for input(s): "INR", "PROTIME" in the last 168 hours.  Cardiac Enzymes: No results for input(s): "CKTOTAL", "CKMB", "CKMBINDEX", "TROPONINI" in the last 168 hours.  HbA1C: Hgb A1c MFr Bld  Date/Time Value Ref Range Status  10/31/2023 06:23 PM 6.8 (H) 4.8 - 5.6 % Final    Comment:    (NOTE)         Prediabetes: 5.7 - 6.4         Diabetes: >6.4         Glycemic control for adults with diabetes: <7.0     CBG: Recent Labs  Lab 11/02/23 1129 11/02/23 1547 11/02/23 1935 11/02/23 2343 11/03/23 0355  GLUCAP 230* 233* 239* 221*  300*   Home Medications  Prior to Admission medications   Medication Sig Start Date End Date Taking? Authorizing Provider  amLODipine (NORVASC) 10 MG tablet Take 10 mg by mouth daily. 08/15/23  Yes [provider]  lisinopril (ZESTRIL) 20 MG tablet Take 20 mg by mouth daily. 08/15/23  Yes [provider]  metFORMIN (GLUCOPHAGE) 1000 MG tablet Take 1,000 mg by mouth 2 (two) times daily. 08/17/23  Yes [provider]  Scheduled Meds:  budesonide (PULMICORT) nebulizer solution  0.25 mg Nebulization BID   Chlorhexidine Gluconate Cloth  6 each Topical Daily   docusate  100 mg Per Tube BID   free water  30 mL Per Tube Q4H   heparin  5,000 Units Subcutaneous Q8H   insulin aspart  0-20 Units Subcutaneous Q4H   insulin aspart  8 Units Subcutaneous Q4H   insulin glargine-yfgn  20 Units Subcutaneous BID   ipratropium-albuterol  3 mL Nebulization Q6H   methylPREDNISolone (SOLU-MEDROL) injection  40 mg Intravenous Q12H   mouth rinse  15 mL Mouth Rinse Q2H   pantoprazole (PROTONIX) IV  40 mg Intravenous Q24H   polyethylene glycol  17 g Per Tube Daily   sodium chloride flush  10-40 mL Intracatheter Q12H   Continuous Infusions:  azithromycin Stopped (11/02/23 1025)   cefTRIAXone (ROCEPHIN)  IV Stopped (11/02/23 2015)   dexmedetomidine (PRECEDEX) IV infusion 1.2 mcg/kg/hr (11/03/23 0605)   feeding supplement (VITAL HIGH PROTEIN) 65 mL/hr at 11/03/23 0600   norepinephrine (LEVOPHED) Adult infusion Stopped (11/02/23 0615)   PRN Meds:.acetaminophen, docusate, fentaNYL (SUBLIMAZE) injection, ipratropium-albuterol, midazolam, mouth rinse, polyethylene glycol, sodium chloride flush   Active Hospital Problem list   See systems below  Assessment & Plan:  43 yo morbidly obese male with Acute on Chronic Hypoxic and Hypercapnic Respiratory Failure due to CAP, 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  CTA chest negative for PE but consistent with an infectious process.   Severe ACUTE Hypoxic and Hypercapnic  Respiratory Failure -continue Mechanical Ventilator support -Wean Fio2 and PEEP as tolerated -VAP/VENT bundle implementation - Wean PEEP & FiO2 as tolerated, maintain SpO2 > 88% - Head of bed elevated 30 degrees, VAP protocol in place - Plateau pressures less than 30 cm H20  - Intermittent chest x-ray & ABG PRN - Ensure adequate pulmonary hygiene  Unable to wean Vent Mode: PRVC FiO2 (%):  [50 %-90 %] 50 % Set Rate:  [24 bmp] 24 bmp Vt Set:  [540 mL] 540 mL PEEP:  [12 cmH20-16 cmH20] 12 cmH20 Plateau Pressure:  [28 cmH20] 28 cmH20    Sepsis with septic shock due to suspected pneumonia STAPH HOMINIS BACTEREMIA -F/u cultures, trend lactic/ PCT -Monitor WBC/ fever curve -IV antibiotics Ceftriaxone AND Azithromycin -Pressors PRN for MAP goal >65 -Strict I/O's   Mildly Elevated Troponin, suspect demand ischemia PMHx: HLD, HTN -Trend HS Troponin until peaked -Hold amlodipine and Lisinopril -Obtain 2D echo-high likelihood of some type of cardiomyopathy and PULM HTN 4/9 Lasix  4/10 diamox    ACUTE SYSTOLIC CARDIAC FAILURE- EF 30% -oxygen as needed -Lasix/Daimox  as tolerated -follow up cardiac enzymes as indicated   ACUTE KIDNEY INJURY/Renal Failure -continue Foley Catheter-assess need -Avoid nephrotoxic agents -Follow urine output, BMP -Ensure adequate renal  perfusion, optimize oxygenation -Renal dose medications   Intake/Output Summary (Last 24 hours) at 11/03/2023 0718 Last data filed at 11/03/2023 0700 Gross per 24 hour  Intake 3138.06 ml  Output 2900 ml  Net 238.06 ml       Latest Ref Rng & Units 11/03/2023    4:05 AM 11/02/2023    5:07 AM 11/01/2023    2:09 PM  BMP  Glucose 70 - 99 mg/dL 161  096  045   BUN 6 - 20 mg/dL 35  39  40   Creatinine 0.61 - 1.24 mg/dL 4.09  8.11  9.14   Sodium 135 - 145 mmol/L 135  137  134   Potassium 3.5 - 5.1 mmol/L 4.6  4.5  4.7   Chloride 98 - 111 mmol/L 102  99  99   CO2 22 - 32 mmol/L 24  27  27    Calcium 8.9 - 10.3 mg/dL  8.6  8.5  7.8     NEUROLOGY -Avoid sedating medications as able Precedex only, awake and follows commands   ENDO - ICU hypoglycemic\Hyperglycemia protocol -check FSBS per protocol   GI GI PROPHYLAXIS as indicated NUTRITIONAL STATUS DIET-->TF's as tolerated Constipation protocol as indicated   ELECTROLYTES -follow labs as needed -replace as needed -pharmacy consultation and following  RESTRICTIVE TRANSFUSION PROTOCOL TRANSFUSION  IF HGB<7  or ACTIVE BLEEDING OR DX of ACUTE CORONARY SYNDROMES   Best practice:  Diet:  NPO Pain/Anxiety/Delirium protocol (if indicated): Yes (RASS goal -1) VAP protocol (if indicated): Yes DVT prophylaxis: Subcutaneous Heparin GI prophylaxis: PPI Glucose control:  SSI Yes Central venous access:  N/A Arterial line:  N/A Foley:  Yes, and it is still needed Mobility:  bed rest  PT consulted: N/A Last date of multidisciplinary goals of care discussion [updated wife at the bedside] Code Status:  full code Disposition: ICU    DVT/GI PRX  assessed I Assessed the need for Labs I Assessed the need for Foley I Assessed the need for Central Venous Line Family Discussion when available I Assessed the need for Mobilization I made an Assessment of medications to be adjusted accordingly Safety Risk assessment completed  CASE DISCUSSED IN MULTIDISCIPLINARY ROUNDS WITH ICU TEAM     Critical Care Time devoted to patient care services described in this note is 55 minutes.  Critical care was necessary to treat /prevent imminent and life-threatening deterioration. Overall, patient is critically ill, prognosis is guarded.  Patient with Multiorgan failure and at high risk for cardiac arrest and death.    Lady Pier, M.D.  [facility]  Medical Director [facility] Medical Director [facility] Cardio-Pulmonary Department

## 2023-11-03 NOTE — Plan of Care (Signed)
  Problem: Education: Goal: Knowledge of General Education information will improve Description: Including pain rating scale, medication(s)/side effects and non-pharmacologic comfort measures Outcome: Progressing   Problem: Health Behavior/Discharge Planning: Goal: Ability to manage health-related needs will improve Outcome: Progressing   Problem: Clinical Measurements: Goal: Ability to maintain clinical measurements within normal limits will improve Outcome: Progressing Goal: Will remain free from infection Outcome: Progressing Goal: Diagnostic test results will improve Outcome: Progressing Goal: Respiratory complications will improve Outcome: Progressing Goal: Cardiovascular complication will be avoided Outcome: Progressing   Problem: Activity: Goal: Risk for activity intolerance will decrease Outcome: Progressing   Problem: Nutrition: Goal: Adequate nutrition will be maintained Outcome: Progressing   Problem: Coping: Goal: Level of anxiety will decrease Outcome: Progressing   Problem: Elimination: Goal: Will not experience complications related to bowel motility Outcome: Progressing Goal: Will not experience complications related to urinary retention Outcome: Progressing   Problem: Safety: Goal: Ability to remain free from injury will improve Outcome: Progressing   Problem: Skin Integrity: Goal: Risk for impaired skin integrity will decrease Outcome: Progressing   Problem: Coping: Goal: Ability to adjust to condition or change in health will improve Outcome: Progressing   Problem: Fluid Volume: Goal: Ability to maintain a balanced intake and output will improve Outcome: Progressing   Problem: Health Behavior/Discharge Planning: Goal: Ability to identify and utilize available resources and services will improve Outcome: Progressing Goal: Ability to manage health-related needs will improve Outcome: Progressing   Problem: Metabolic: Goal: Ability to  maintain appropriate glucose levels will improve Outcome: Progressing   Problem: Tissue Perfusion: Goal: Adequacy of tissue perfusion will improve Outcome: Progressing   Problem: Activity: Goal: Ability to tolerate increased activity will improve Outcome: Progressing   Problem: Respiratory: Goal: Ability to maintain a clear airway and adequate ventilation will improve Outcome: Progressing   Problem: Role Relationship: Goal: Method of communication will improve Outcome: Progressing

## 2023-11-03 NOTE — Inpatient Diabetes Management (Signed)
 Inpatient Diabetes Program Recommendations  AACE/ADA: New Consensus Statement on Inpatient Glycemic Control (2015)  Target Ranges:  Prepandial:   less than 140 mg/dL      Peak postprandial:   less than 180 mg/dL (1-2 hours)      Critically ill patients:  140 - 180 mg/dL   Lab Results  Component Value Date   GLUCAP 298 (H) 11/03/2023   HGBA1C 6.8 (H) 10/31/2023    Review of Glycemic Control  Latest Reference Range & Units 11/02/23 11:29 11/02/23 15:47 11/02/23 19:35 11/02/23 23:43 11/03/23 03:55 11/03/23 07:23  Glucose-Capillary 70 - 99 mg/dL 161 (H) 096 (H) 045 (H) 221 (H) 300 (H) 298 (H)   Diabetes history: DM Outpatient Diabetes medications:  Metformin 1000 mg bid Current orders for Inpatient glycemic control:  Novolog 0-20 units q 4 hours Novolog 8 units q 4 hours Semglee 20 units bid Solumedrol 40 mg IV q 12 hours Vital (high protein)- 75 ml/hr Inpatient Diabetes Program Recommendations:   Agree with increase in Novolog tube feed coverage. If CBGs remain >250 mg/dL, consider IV insulin?    Thanks,  Josefa Ni, RN, BC-ADM Inpatient Diabetes Coordinator Pager [phone]  (8a-5p)

## 2023-11-03 NOTE — IPAL (Signed)
  Interdisciplinary Goals of Care Family Meeting   Date carried out: 11/03/2023  Location of the meeting: Bedside  Member's involved: Physician, Bedside Registered Nurse, and Family Member or next of kin    GOALS OF CARE DISCUSSION  The Clinical status was relayed to family in detail- Wife at bedside  Updated and notified of patients medical condition- Severe resp failure Needs vent support Continue aggressive diuresis Needs vent at this time  PATIENT REMAINS FULL CODE  Family understands the situation. Patient has frequent proptosis, patient pops eye back in by himself  Family are satisfied with Plan of action and management. All questions answered  Additional CC time 25 mins   Clifford Nichols Nestora Baptise, M.D.  [facility]  Medical Director [facility] Medical Director [facility] Cardio-Pulmonary Department

## 2023-11-03 NOTE — Progress Notes (Signed)
 [facility]  ROUNDING NOTE   Subjective:   Remains intubated on vent with 40% FiO2 Sedated Levo stopped this morning Mother at bedside  Lab Results  Component Value Date   CREATININE 1.36 (H) 11/03/2023   CREATININE 1.63 (H) 11/02/2023   CREATININE 1.87 (H) 11/01/2023   04/10 0701 - 04/11 0700 In: 3138.1 [I.V.:1347.4; NG/GT:1440.5; IV Piggyback:350.2] Out: 2900 [Urine:2900]   Objective:  Vital signs in last 24 hours:  Temp:  [100.2 F (37.9 C)-101.7 F (38.7 C)] 100.6 F (38.1 C) (04/11 1300) Pulse Rate:  [54-84] 80 (04/11 1300) Resp:  [15-33] 20 (04/11 1300) BP: (118-146)/(77-95) 146/87 (04/11 1300) SpO2:  [89 %-100 %] 100 % (04/11 1330) Arterial Line BP: (78-129)/(69-96) 128/83 (04/11 1300) FiO2 (%):  [40 %-60 %] 40 % (04/11 1330) Weight:  [188.1 kg] 188.1 kg (04/11 0233)  Weight change: -0.2 kg Filed Weights   11/01/23 0443 11/02/23 0319 11/03/23 0233  Weight: (!) 188.2 kg (!) 188.3 kg (!) 188.1 kg    Intake/Output: I/O last 3 completed shifts: In: 4681.2 [I.V.:2140.7; NG/GT:2090.3; IV Piggyback:450.2] Out: 3850 [Urine:3850]   Intake/Output this shift:  Total I/O In: 20 [I.V.:20] Out: 850 [Urine:850]  Physical Exam: General: Critically ill-appearing  Head: Normocephalic, atraumatic.  Endotracheal tube in place  Neck: Supple  Lungs:  Scattered rhonchi Vent assisted  Heart: S1S2 no rubs  Abdomen:  Soft, nontender, bowel sounds present  Extremities: 1+ peripheral edema.  Neurologic: Awake, alert, following commands  Skin: No acute rash  Access: No hemodialysis access    Basic Metabolic Panel: Recent Labs  Lab 10/31/23 0616 11/01/23 0507 11/01/23 1409 11/02/23 0507 11/03/23 0405  NA 130* 132* 134* 137 135  K 4.4 5.2* 4.7 4.5 4.6  CL 96* 99 99 99 102  CO2 22 23 27 27 24   GLUCOSE 272* 365* 287* 254* 315*  BUN 19 38* 40* 39* 35*  CREATININE 1.31* 2.35* 1.87* 1.63* 1.36*  CALCIUM 8.0* 7.8* 7.8* 8.5* 8.6*  MG 2.1 2.4  --  2.7*  2.6*  PHOS 2.2* 5.0*  --  3.5 3.5    Liver Function Tests: Recent Labs  Lab 10/30/23 2348 11/01/23 0507  AST 37  --   ALT 46*  --   ALKPHOS 61  --   BILITOT 0.9  --   PROT 9.0*  --   ALBUMIN 3.7 2.9*   No results for input(s): "LIPASE", "AMYLASE" in the last 168 hours. No results for input(s): "AMMONIA" in the last 168 hours.  CBC: Recent Labs  Lab 10/30/23 2348 11/01/23 0507 11/02/23 0507 11/03/23 0405  WBC 15.3* 16.7* 12.3* 11.4*  NEUTROABS 10.1*  --   --   --   HGB 15.0 14.1 13.8 13.5  HCT 54.2* 49.7 48.1 47.6  MCV 95.1 92.2 90.4 93.3  PLT 195 179 177 153    Cardiac Enzymes: No results for input(s): "CKTOTAL", "CKMB", "CKMBINDEX", "TROPONINI" in the last 168 hours.  BNP: Invalid input(s): "POCBNP"  CBG: Recent Labs  Lab 11/02/23 1935 11/02/23 2343 11/03/23 0355 11/03/23 0723 11/03/23 1052  GLUCAP 239* 221* 300* 298* 247*    Microbiology: Results for orders placed or performed during the hospital encounter of 10/30/23  Blood culture (routine x 2)     Status: None (Preliminary result)   Collection Time: 10/30/23 11:50 PM   Specimen: BLOOD  Result Value Ref Range Status   Specimen Description BLOOD BLOOD RIGHT HAND  Final   Special Requests   Final    BOTTLES DRAWN AEROBIC  AND ANAEROBIC Blood Culture adequate volume   Culture   Final    NO GROWTH 3 DAYS Performed at [facility], [address] [place], [place] [address]    Report Status PENDING  Incomplete  Resp panel by RT-PCR (RSV, Flu A&B, Covid) Anterior Nasal Swab     Status: None   Collection Time: 10/31/23 12:33 AM   Specimen: Anterior Nasal Swab  Result Value Ref Range Status   SARS Coronavirus 2 by RT PCR NEGATIVE NEGATIVE Final    Comment: (NOTE) SARS-CoV-2 target nucleic acids are NOT DETECTED.  The SARS-CoV-2 RNA is generally detectable in upper respiratory specimens during the acute phase of infection. The lowest concentration of SARS-CoV-2 viral copies this assay can  detect is 138 copies/mL. A negative result does not preclude SARS-Cov-2 infection and should not be used as the sole basis for treatment or other patient management decisions. A negative result may occur with  improper specimen collection/handling, submission of specimen other than nasopharyngeal swab, presence of viral mutation(s) within the areas targeted by this assay, and inadequate number of viral copies(<138 copies/mL). A negative result must be combined with clinical observations, patient history, and epidemiological information. The expected result is Negative.  Fact Sheet for Patients:  BloggerCourse.com  Fact Sheet for Healthcare Providers:  SeriousBroker.it  This test is no t yet approved or cleared by the [place]  FDA and  has been authorized for detection and/or diagnosis of SARS-CoV-2 by FDA under an Emergency Use Authorization (EUA). This EUA will remain  in effect (meaning this test can be used) for the duration of the COVID-19 declaration under Section 564(b)(1) of the Act, 21 U.S.C.section 360bbb-3(b)(1), unless the authorization is terminated  or revoked sooner.       Influenza A by PCR NEGATIVE NEGATIVE Final   Influenza B by PCR NEGATIVE NEGATIVE Final    Comment: (NOTE) The Xpert Xpress SARS-CoV-2/FLU/RSV plus assay is intended as an aid in the diagnosis of influenza from Nasopharyngeal swab specimens and should not be used as a sole basis for treatment. Nasal washings and aspirates are unacceptable for Xpert Xpress SARS-CoV-2/FLU/RSV testing.  Fact Sheet for Patients: BloggerCourse.com  Fact Sheet for Healthcare Providers: SeriousBroker.it  This test is not yet approved or cleared by the [place]  FDA and has been authorized for detection and/or diagnosis of SARS-CoV-2 by FDA under an Emergency Use Authorization (EUA). This EUA will remain in  effect (meaning this test can be used) for the duration of the COVID-19 declaration under Section 564(b)(1) of the Act, 21 U.S.C. section 360bbb-3(b)(1), unless the authorization is terminated or revoked.     Resp Syncytial Virus by PCR NEGATIVE NEGATIVE Final    Comment: (NOTE) Fact Sheet for Patients: BloggerCourse.com  Fact Sheet for Healthcare Providers: SeriousBroker.it  This test is not yet approved or cleared by the [place]  FDA and has been authorized for detection and/or diagnosis of SARS-CoV-2 by FDA under an Emergency Use Authorization (EUA). This EUA will remain in effect (meaning this test can be used) for the duration of the COVID-19 declaration under Section 564(b)(1) of the Act, 21 U.S.C. section 360bbb-3(b)(1), unless the authorization is terminated or revoked.  Performed at [facility], [address] [place], [place] [address]   Blood culture (routine x 2)     Status: Abnormal (Preliminary result)   Collection Time: 10/31/23  1:36 AM   Specimen: BLOOD  Result Value Ref Range Status   Specimen Description   Final  BLOOD RIGHT ANTECUBITAL Performed at [facility], [address], [place], [place] [address]    Special Requests   Final    BOTTLES DRAWN AEROBIC AND ANAEROBIC Blood Culture results may not be optimal due to an inadequate volume of blood received in culture bottles Performed at [facility], [address] [place], [place] [address]    Culture  Setup Time   Final    IN BOTH AEROBIC AND ANAEROBIC BOTTLES GRAM POSITIVE COCCI CALLED TO ANGELA DOBBS 1631 10/31/23 LRL    Culture (A)  Final    STAPHYLOCOCCUS HOMINIS THE SIGNIFICANCE OF ISOLATING THIS ORGANISM FROM A SINGLE SET OF BLOOD CULTURES WHEN MULTIPLE SETS ARE DRAWN IS UNCERTAIN. PLEASE NOTIFY THE MICROBIOLOGY DEPARTMENT WITHIN ONE WEEK IF SPECIATION AND SENSITIVITIES ARE REQUIRED. CULTURE REINCUBATED FOR  BETTER GROWTH Performed at [facility], [address] [place], [place] [address]    Report Status PENDING  Incomplete  Blood Culture ID Panel (Reflexed)     Status: Abnormal   Collection Time: 10/31/23  1:36 AM  Result Value Ref Range Status   Enterococcus faecalis NOT DETECTED NOT DETECTED Final   Enterococcus Faecium NOT DETECTED NOT DETECTED Final   Listeria monocytogenes NOT DETECTED NOT DETECTED Final   Staphylococcus species DETECTED (A) NOT DETECTED Corrected    Comment: CRITICAL RESULT CALLED TO, READ BACK BY AND VERIFIED WITH: CALLED TO ANGELA DOBBS 1631 10/31/23 LRL CORRECTED ON 04/09 AT 0852: PREVIOUSLY REPORTED AS DETECTED CALLED TO ANGELA DOBBS 1631 10/31/23 LRL    Staphylococcus aureus (BCID) NOT DETECTED NOT DETECTED Final   Staphylococcus epidermidis DETECTED (A) NOT DETECTED Corrected    Comment: CRITICAL RESULT CALLED TO, READ BACK BY AND VERIFIED WITH: CALLED TO ANGELA DOBBS 1631 10/31/23 LRL CORRECTED ON 04/09 AT 0852: PREVIOUSLY REPORTED AS DETECTED CALLED TO ANGELA DOBBS 1631 10/31/23 LRL    Staphylococcus lugdunensis NOT DETECTED NOT DETECTED Final   Streptococcus species NOT DETECTED NOT DETECTED Final   Streptococcus agalactiae NOT DETECTED NOT DETECTED Final   Streptococcus pneumoniae NOT DETECTED NOT DETECTED Final   Streptococcus pyogenes NOT DETECTED NOT DETECTED Final   A.calcoaceticus-baumannii NOT DETECTED NOT DETECTED Final   Bacteroides fragilis NOT DETECTED NOT DETECTED Final   Enterobacterales NOT DETECTED NOT DETECTED Final   Enterobacter cloacae complex NOT DETECTED NOT DETECTED Final   Escherichia coli NOT DETECTED NOT DETECTED Final   Klebsiella aerogenes NOT DETECTED NOT DETECTED Final   Klebsiella oxytoca NOT DETECTED NOT DETECTED Final   Klebsiella pneumoniae NOT DETECTED NOT DETECTED Final   Proteus species NOT DETECTED NOT DETECTED Final   Salmonella species NOT DETECTED NOT DETECTED Final   Serratia marcescens NOT DETECTED NOT  DETECTED Final   Haemophilus influenzae NOT DETECTED NOT DETECTED Final   Neisseria meningitidis NOT DETECTED NOT DETECTED Final   Pseudomonas aeruginosa NOT DETECTED NOT DETECTED Final   Stenotrophomonas maltophilia NOT DETECTED NOT DETECTED Final   Candida albicans NOT DETECTED NOT DETECTED Final   Candida auris NOT DETECTED NOT DETECTED Final   Candida glabrata NOT DETECTED NOT DETECTED Final   Candida krusei NOT DETECTED NOT DETECTED Final   Candida parapsilosis NOT DETECTED NOT DETECTED Final   Candida tropicalis NOT DETECTED NOT DETECTED Final   Cryptococcus neoformans/gattii NOT DETECTED NOT DETECTED Final   Methicillin resistance mecA/C NOT DETECTED NOT DETECTED Final    Comment: Performed at [facility], [address] [place], [place] [address]  Respiratory (~20 pathogens) panel by PCR  Status: None   Collection Time: 10/31/23 12:07 PM   Specimen: Nasopharyngeal Swab; Respiratory  Result Value Ref Range Status   Adenovirus NOT DETECTED NOT DETECTED Final   Coronavirus 229E NOT DETECTED NOT DETECTED Final    Comment: (NOTE) The Coronavirus on the Respiratory Panel, DOES NOT test for the novel  Coronavirus (2019 nCoV)    Coronavirus HKU1 NOT DETECTED NOT DETECTED Final   Coronavirus NL63 NOT DETECTED NOT DETECTED Final   Coronavirus OC43 NOT DETECTED NOT DETECTED Final   Metapneumovirus NOT DETECTED NOT DETECTED Final   Rhinovirus / Enterovirus NOT DETECTED NOT DETECTED Final   Influenza A NOT DETECTED NOT DETECTED Final   Influenza B NOT DETECTED NOT DETECTED Final   Parainfluenza Virus 1 NOT DETECTED NOT DETECTED Final   Parainfluenza Virus 2 NOT DETECTED NOT DETECTED Final   Parainfluenza Virus 3 NOT DETECTED NOT DETECTED Final   Parainfluenza Virus 4 NOT DETECTED NOT DETECTED Final   Respiratory Syncytial Virus NOT DETECTED NOT DETECTED Final   Bordetella pertussis NOT DETECTED NOT DETECTED Final   Bordetella Parapertussis NOT DETECTED NOT DETECTED  Final   Chlamydophila pneumoniae NOT DETECTED NOT DETECTED Final   Mycoplasma pneumoniae NOT DETECTED NOT DETECTED Final    Comment: Performed at [facility], [address], [place], [place] [address]  Culture, Respiratory w Gram Stain     Status: None   Collection Time: 10/31/23  4:53 PM   Specimen: Tracheal Aspirate; Respiratory  Result Value Ref Range Status   Specimen Description   Final    TRACHEAL ASPIRATE Performed at [facility], [address], [place], [place] [address]    Special Requests   Final    NONE Performed at [facility], [address] [place], [place] [address]    Gram Stain   Final    NO WBC SEEN RARE GRAM POSITIVE COCCI RARE GRAM POSITIVE RODS    Culture   Final    ABUNDANT Normal respiratory flora-no Staph aureus or Pseudomonas seen Performed at [facility], [address] [place], [place] [address]    Report Status 11/03/2023 FINAL  Final  MRSA Next Gen by PCR, Nasal     Status: None   Collection Time: 11/01/23  6:17 PM   Specimen: Nasal Mucosa; Nasal Swab  Result Value Ref Range Status   MRSA by PCR Next Gen NOT DETECTED NOT DETECTED Final    Comment: (NOTE) The GeneXpert MRSA Assay (FDA approved for NASAL specimens only), is one component of a comprehensive MRSA colonization surveillance program. It is not intended to diagnose MRSA infection nor to guide or monitor treatment for MRSA infections. Test performance is not FDA approved in patients less than 63 years old. Performed at [facility], [address] [place], [place] [address]     Coagulation Studies: No results for input(s): "LABPROT", "INR" in the last 72 hours.  Urinalysis: No results for input(s): "COLORURINE", "LABSPEC", "PHURINE", "GLUCOSEU", "HGBUR", "BILIRUBINUR", "KETONESUR", "PROTEINUR", "UROBILINOGEN", "NITRITE", "LEUKOCYTESUR" in the last 72 hours.  Invalid input(s): "APPERANCEUR"    Imaging: No results  found.    Medications:    azithromycin 500 mg (11/03/23 0852)   cefTRIAXone (ROCEPHIN)  IV     dexmedetomidine (PRECEDEX) IV infusion 1.2 mcg/kg/hr (11/03/23 1335)   feeding supplement (VITAL HIGH PROTEIN) 65 mL/hr at 11/03/23 0700   norepinephrine (LEVOPHED) Adult infusion Stopped (11/02/23 0615)    budesonide (PULMICORT) nebulizer solution  0.25 mg Nebulization BID   Chlorhexidine  Gluconate Cloth  6 each Topical Daily   docusate  100 mg Per Tube BID   free water  30 mL Per Tube Q4H   heparin  5,000 Units Subcutaneous Q8H   insulin aspart  0-20 Units Subcutaneous Q4H   insulin aspart  8 Units Subcutaneous Q4H   insulin glargine-yfgn  20 Units Subcutaneous BID   ipratropium-albuterol  3 mL Nebulization Q6H   [START ON 11/04/2023] methylPREDNISolone (SOLU-MEDROL) injection  40 mg Intravenous Q24H   mouth rinse  15 mL Mouth Rinse Q2H   pantoprazole (PROTONIX) IV  40 mg Intravenous Q24H   polyethylene glycol  17 g Per Tube Daily   sodium chloride flush  10-40 mL Intracatheter Q12H   acetaminophen, docusate, fentaNYL (SUBLIMAZE) injection, ipratropium-albuterol, midazolam, mouth rinse, polyethylene glycol, sodium chloride flush  Assessment/ Plan:  43 y.o. male with a PMHx of severe obstructive sleep apnea, chronic respiratory load on 4 L of oxygen, extreme obesity, diabetes mellitus type 2, hypertension, who was admitted to [facility] on 10/30/2023 for evaluation of significant shortness of breath.   1.  Acute kidney injury.  Acute kidney injury likely multifactorial with contribution from contrast exposure as well as hypotension.  Suspect he most likely has some element of ATN. Update:   Creatinine has improved and Urine output acceptable at 2.9L.   2.  Acute respiratory failure.  Remains on vent support.  Weaning as per pulmonary/medical care.  3.  Hypotension. Levo weaned this morning.  Monitor blood pressure closely.  4.  Hyponatremia.  S sodium 135  Due to continued improvements  with renal function, we will sign off at this time.    LOS: 3 Clifford Nichols 4/11/20251:42 PM

## 2023-11-03 NOTE — Plan of Care (Signed)
  Problem: Education: Goal: Knowledge of General Education information will improve Description: Including pain rating scale, medication(s)/side effects and non-pharmacologic comfort measures Outcome: Progressing   Problem: Clinical Measurements: Goal: Cardiovascular complication will be avoided Outcome: Progressing   Problem: Nutrition: Goal: Adequate nutrition will be maintained Outcome: Progressing   Problem: Coping: Goal: Level of anxiety will decrease Outcome: Progressing   Problem: Pain Managment: Goal: General experience of comfort will improve and/or be controlled Outcome: Progressing

## 2023-11-03 NOTE — Progress Notes (Signed)
 PHARMACY CONSULT NOTE - ELECTROLYTES  Pharmacy Consult for Electrolyte Monitoring and Replacement   Recent Labs: Height: 5\' 9"  (175.3 cm) Weight: (!) 188.1 kg (414 lb 11 oz) IBW/kg (Calculated) : 70.7 Estimated Creatinine Clearance: 120.2 mL/min (A) (by C-G formula based on SCr of 1.36 mg/dL (H)). Potassium (mmol/L)  Date Value  11/03/2023 4.6   Magnesium (mg/dL)  Date Value  16/04/9603 2.6 (H)   Calcium (mg/dL)  Date Value  54/03/8118 8.6 (L)   Albumin (g/dL)  Date Value  14/78/2956 2.9 (L)   Phosphorus (mg/dL)  Date Value  21/30/8657 3.5   Sodium (mmol/L)  Date Value  11/03/2023 135   Corrected Ca: 8.78 mg/dL        Ca 7.8  Alb 2.9  Assessment  Clifford Nichols is a 43 y.o. male presenting with SHOB. PMH significant for obesity, obstructive sleep apnea, chronic respiratory failure on 4 L of oxygen, DM, HTN. Pharmacy has been consulted to monitor and replace electrolytes.  Diet: NPO MIVF: none Pertinent medications: on Propofol drip, intubated  Goal of Therapy: Electrolytes WNL  Plan:  No replacement indicated Check BMP, Mg, Phos with AM labs  Thank you for allowing pharmacy to be a part of this patient's care.  Mikle Sternberg A [place] Kuang, PharmD Clinical Pharmacist 11/03/2023 7:29 AM

## 2023-11-04 ENCOUNTER — Inpatient Hospital Stay

## 2023-11-04 DIAGNOSIS — J9602 Acute respiratory failure with hypercapnia: Secondary | ICD-10-CM | POA: Diagnosis not present

## 2023-11-04 DIAGNOSIS — J9601 Acute respiratory failure with hypoxia: Secondary | ICD-10-CM | POA: Diagnosis not present

## 2023-11-04 DIAGNOSIS — R6521 Severe sepsis with septic shock: Secondary | ICD-10-CM | POA: Diagnosis not present

## 2023-11-04 DIAGNOSIS — A419 Sepsis, unspecified organism: Secondary | ICD-10-CM | POA: Diagnosis not present

## 2023-11-04 LAB — GLUCOSE, CAPILLARY
Glucose-Capillary: 100 mg/dL — ABNORMAL HIGH (ref 70–99)
Glucose-Capillary: 142 mg/dL — ABNORMAL HIGH (ref 70–99)
Glucose-Capillary: 159 mg/dL — ABNORMAL HIGH (ref 70–99)
Glucose-Capillary: 174 mg/dL — ABNORMAL HIGH (ref 70–99)
Glucose-Capillary: 190 mg/dL — ABNORMAL HIGH (ref 70–99)
Glucose-Capillary: 194 mg/dL — ABNORMAL HIGH (ref 70–99)
Glucose-Capillary: 198 mg/dL — ABNORMAL HIGH (ref 70–99)
Glucose-Capillary: 225 mg/dL — ABNORMAL HIGH (ref 70–99)

## 2023-11-04 LAB — CBC
HCT: 48.2 % (ref 39.0–52.0)
Hemoglobin: 13.7 g/dL (ref 13.0–17.0)
MCH: 26.4 pg (ref 26.0–34.0)
MCHC: 28.4 g/dL — ABNORMAL LOW (ref 30.0–36.0)
MCV: 93.1 fL (ref 80.0–100.0)
Platelets: 131 10*3/uL — ABNORMAL LOW (ref 150–400)
RBC: 5.18 MIL/uL (ref 4.22–5.81)
RDW: 15.2 % (ref 11.5–15.5)
WBC: 11 10*3/uL — ABNORMAL HIGH (ref 4.0–10.5)
nRBC: 0.2 % (ref 0.0–0.2)

## 2023-11-04 LAB — COMPREHENSIVE METABOLIC PANEL WITH GFR
ALT: 53 U/L — ABNORMAL HIGH (ref 0–44)
AST: 77 U/L — ABNORMAL HIGH (ref 15–41)
Albumin: 3 g/dL — ABNORMAL LOW (ref 3.5–5.0)
Alkaline Phosphatase: 39 U/L (ref 38–126)
Anion gap: 8 (ref 5–15)
BUN: 35 mg/dL — ABNORMAL HIGH (ref 6–20)
CO2: 25 mmol/L (ref 22–32)
Calcium: 9 mg/dL (ref 8.9–10.3)
Chloride: 105 mmol/L (ref 98–111)
Creatinine, Ser: 1.28 mg/dL — ABNORMAL HIGH (ref 0.61–1.24)
GFR, Estimated: >60 mL/min (ref >60)
Glucose, Bld: 173 mg/dL — ABNORMAL HIGH (ref 70–99)
Potassium: 3.7 mmol/L (ref 3.5–5.1)
Sodium: 138 mmol/L (ref 135–145)
Total Bilirubin: 0.8 mg/dL (ref 0.0–1.2)
Total Protein: 7.3 g/dL (ref 6.5–8.1)

## 2023-11-04 LAB — BASIC METABOLIC PANEL WITH GFR
Anion gap: 7 (ref 5–15)
BUN: 35 mg/dL — ABNORMAL HIGH (ref 6–20)
CO2: 26 mmol/L (ref 22–32)
Calcium: 9.1 mg/dL (ref 8.9–10.3)
Chloride: 107 mmol/L (ref 98–111)
Creatinine, Ser: 1.29 mg/dL — ABNORMAL HIGH (ref 0.61–1.24)
GFR, Estimated: >60 mL/min (ref >60)
Glucose, Bld: 174 mg/dL — ABNORMAL HIGH (ref 70–99)
Potassium: 4.4 mmol/L (ref 3.5–5.1)
Sodium: 140 mmol/L (ref 135–145)

## 2023-11-04 LAB — T3, FREE: T3, Free: 0.7 pg/mL — ABNORMAL LOW (ref 2.0–4.4)

## 2023-11-04 LAB — MAGNESIUM
Magnesium: 2.3 mg/dL (ref 1.7–2.4)
Magnesium: 2.2 mg/dL (ref 1.7–2.4)

## 2023-11-04 LAB — PHOSPHORUS
Phosphorus: 3.5 mg/dL (ref 2.5–4.6)
Phosphorus: 3.8 mg/dL (ref 2.5–4.6)

## 2023-11-04 MED ORDER — INSULIN ASPART 100 UNIT/ML IJ SOLN
12.0000 [IU] | INTRAMUSCULAR | Status: DC
  Administered 2023-11-04 – 2023-11-06 (×9): 12 [IU] via SUBCUTANEOUS
  Filled 2023-11-04 (×10): qty 1

## 2023-11-04 MED ORDER — FUROSEMIDE 10 MG/ML IJ SOLN
80.0000 mg | Freq: Once | INTRAMUSCULAR | Status: AC
  Administered 2023-11-04: 80 mg via INTRAVENOUS
  Filled 2023-11-04: qty 8

## 2023-11-04 MED ORDER — ACETAZOLAMIDE SODIUM 500 MG IJ SOLR
500.0000 mg | Freq: Once | INTRAMUSCULAR | Status: AC
  Administered 2023-11-04: 500 mg via INTRAVENOUS
  Filled 2023-11-04: qty 500

## 2023-11-04 NOTE — Progress Notes (Signed)
 NAME:  Clifford Nichols, MRN:  [id number], DOB:  02-18-1981, LOS: 4 ADMISSION DATE:  10/30/2023, CONSULTATION DATE:  10/31/23 REFERRING MD:  Verneda Golder   CHIEF COMPLAINT:  shortness of breath    HPI  43 y.o with significant PMH of  severe OSA, chronic respiraotry failure on 4L, extreme obesity with Alveolar hypoventilation, DM and HTN who presented to the ED with chief complaints of progressive shortness of breath.  Per ED reports, EMS was called for worsening respiratory distress. On EMS arrival, patient was found with oxygen saturation in the 80's on his normal 4L. He was placed on NRB and then transitioned to CPAP. He was treated with Duonebs and IV solumedrol enroute, shortly after he became unresponsive.   ED Course: Initial vital signs showed HR of 96 beats/minute, BP 136/37mm Hg, the RR 28breaths/minute, and the oxygen saturation 83% on CPAP and a temperature of 101.96F (38.4C). Patient was lethargic, moaned intermittently, and responded with one-word answers; symmetric movement in the arms and legs was observed. Due to worsening mental status on CPAP he was intubated for airway protection.  Pertinent Labs/Diagnostics Findings: Na+/ K+: 136/4.5 Glucose: 230  WBC: 15.3K/L with bands or neutrophil predominance    PCT: 0.27 COVID PCR: Negative,  troponin: 95  BNP: 243 ABG: pO2 59; pCO2 83; pH 7.23;  HCO3 34.8, %O2 Sat 86.2.  CXR>  CTA Chest> see results below Patient given 30 cc/kg of fluids and started on broad-spectrum antibiotics Vanco cefepime and Flagyl for sepsis with septic shock. Patient remained hypotensive despite IVF boluses therefore was started on Levophed. PCCM consulted.   Past Medical History  severe OSA, chronic respiraotry failure on 4L, extreme obesity with Alveolar hypoventilation, DM and HTN  Significant Hospital Events   4/8: Admit to ICU with acute on chronic hypoxic hypercapnic respiratory failure in the setting of pneumonia and alveolar hypoventilation   4/9 severe Hypoxia, CVl, ART line placed, alert and awake on precedex 4/10 severe hypoxia Po2 in 80's, acute LEFT EYE PROPTOSIS alert and awake on precedex 4/11 severe hypoxia, remains on ventalert and awake on precedex 4/12 severe hypoxia alert and awake on precedex  Consults:  None  Procedures:  4/8: Ablation  Significant Diagnostic Tests:  4/8: Chest Xray> IMPRESSION: Low lung volumes with mild right basilar atelectasis and/or infiltrate.  4/8: CTA Chest, abdomen and pelvis> IMPRESSION: 1. Limited study without evidence of pulmonary embolism. 2. Marked severity areas of bilateral lower lobe scarring, atelectasis and/or infiltrate. 3. Mild right middle lobe atelectatic changes. 4. Properly positioned endotracheal and nasogastric tubes.  Interim History / Subjective:    Remains critically ill Remains intubated Severe hypoxia Requires VENT support for survival alert and awake on precedex  Vent Mode: PRVC FiO2 (%):  [28 %-40 %] 40 % Set Rate:  [24 bmp] 24 bmp Vt Set:  [540 mL] 540 mL PEEP:  [10 cmH20-12 cmH20] 10 cmH20 Pressure Support:  [12 cmH20] 12 cmH20 Plateau Pressure:  [26 cmH20] 26 cmH20    Micro Data:  4/8: SARS-CoV-2 PCR> negative 4/8: Influenza PCR> negative 4/8: Blood culture x2> 4/8: MRSA PCR>>  4/8: Strep pneumo urinary antigen> 4/8: Legionella urinary antigen> Results for orders placed or performed during the hospital encounter of 10/30/23  Blood culture (routine x 2)     Status: None (Preliminary result)   Collection Time: 10/30/23 11:50 PM   Specimen: BLOOD  Result Value Ref Range Status   Specimen Description BLOOD BLOOD RIGHT HAND  Final   Special Requests  Final    BOTTLES DRAWN AEROBIC AND ANAEROBIC Blood Culture adequate volume   Culture   Final    NO GROWTH 3 DAYS Performed at [facility], [address] [place], [place] [address]    Report Status PENDING  Incomplete  Resp panel by RT-PCR (RSV, Flu A&B, Covid)  Anterior Nasal Swab     Status: None   Collection Time: 10/31/23 12:33 AM   Specimen: Anterior Nasal Swab  Result Value Ref Range Status   SARS Coronavirus 2 by RT PCR NEGATIVE NEGATIVE Final    Comment: (NOTE) SARS-CoV-2 target nucleic acids are NOT DETECTED.  The SARS-CoV-2 RNA is generally detectable in upper respiratory specimens during the acute phase of infection. The lowest concentration of SARS-CoV-2 viral copies this assay can detect is 138 copies/mL. A negative result does not preclude SARS-Cov-2 infection and should not be used as the sole basis for treatment or other patient management decisions. A negative result may occur with  improper specimen collection/handling, submission of specimen other than nasopharyngeal swab, presence of viral mutation(s) within the areas targeted by this assay, and inadequate number of viral copies(<138 copies/mL). A negative result must be combined with clinical observations, patient history, and epidemiological information. The expected result is Negative.  Fact Sheet for Patients:  BloggerCourse.com  Fact Sheet for Healthcare Providers:  SeriousBroker.it  This test is no t yet approved or cleared by the [place]  FDA and  has been authorized for detection and/or diagnosis of SARS-CoV-2 by FDA under an Emergency Use Authorization (EUA). This EUA will remain  in effect (meaning this test can be used) for the duration of the COVID-19 declaration under Section 564(b)(1) of the Act, 21 U.S.C.section 360bbb-3(b)(1), unless the authorization is terminated  or revoked sooner.       Influenza A by PCR NEGATIVE NEGATIVE Final   Influenza B by PCR NEGATIVE NEGATIVE Final    Comment: (NOTE) The Xpert Xpress SARS-CoV-2/FLU/RSV plus assay is intended as an aid in the diagnosis of influenza from Nasopharyngeal swab specimens and should not be used as a sole basis for treatment. Nasal washings  and aspirates are unacceptable for Xpert Xpress SARS-CoV-2/FLU/RSV testing.  Fact Sheet for Patients: BloggerCourse.com  Fact Sheet for Healthcare Providers: SeriousBroker.it  This test is not yet approved or cleared by the [place]  FDA and has been authorized for detection and/or diagnosis of SARS-CoV-2 by FDA under an Emergency Use Authorization (EUA). This EUA will remain in effect (meaning this test can be used) for the duration of the COVID-19 declaration under Section 564(b)(1) of the Act, 21 U.S.C. section 360bbb-3(b)(1), unless the authorization is terminated or revoked.     Resp Syncytial Virus by PCR NEGATIVE NEGATIVE Final    Comment: (NOTE) Fact Sheet for Patients: BloggerCourse.com  Fact Sheet for Healthcare Providers: SeriousBroker.it  This test is not yet approved or cleared by the [place]  FDA and has been authorized for detection and/or diagnosis of SARS-CoV-2 by FDA under an Emergency Use Authorization (EUA). This EUA will remain in effect (meaning this test can be used) for the duration of the COVID-19 declaration under Section 564(b)(1) of the Act, 21 U.S.C. section 360bbb-3(b)(1), unless the authorization is terminated or revoked.  Performed at [facility], [address] [place], [place] [address]   Blood culture (routine x 2)     Status: Abnormal (Preliminary result)   Collection Time: 10/31/23  1:36 AM   Specimen: BLOOD  Result Value Ref Range Status   Specimen  Description   Final    BLOOD RIGHT ANTECUBITAL Performed at [facility], [address] [place], [place] [address]    Special Requests   Final    BOTTLES DRAWN AEROBIC AND ANAEROBIC Blood Culture results may not be optimal due to an inadequate volume of blood received in culture bottles Performed at [facility], [address] [place], [place]  [address]    Culture  Setup Time   Final    IN BOTH AEROBIC AND ANAEROBIC BOTTLES GRAM POSITIVE COCCI CALLED TO ANGELA DOBBS 1631 10/31/23 LRL    Culture (A)  Final    STAPHYLOCOCCUS HOMINIS THE SIGNIFICANCE OF ISOLATING THIS ORGANISM FROM A SINGLE SET OF BLOOD CULTURES WHEN MULTIPLE SETS ARE DRAWN IS UNCERTAIN. PLEASE NOTIFY THE MICROBIOLOGY DEPARTMENT WITHIN ONE WEEK IF SPECIATION AND SENSITIVITIES ARE REQUIRED. CULTURE REINCUBATED FOR BETTER GROWTH Performed at [facility], [address] [place], [place] [address]    Report Status PENDING  Incomplete  Blood Culture ID Panel (Reflexed)     Status: Abnormal   Collection Time: 10/31/23  1:36 AM  Result Value Ref Range Status   Enterococcus faecalis NOT DETECTED NOT DETECTED Final   Enterococcus Faecium NOT DETECTED NOT DETECTED Final   Listeria monocytogenes NOT DETECTED NOT DETECTED Final   Staphylococcus species DETECTED (A) NOT DETECTED Corrected    Comment: CRITICAL RESULT CALLED TO, READ BACK BY AND VERIFIED WITH: CALLED TO ANGELA DOBBS 1631 10/31/23 LRL CORRECTED ON 04/09 AT 0852: PREVIOUSLY REPORTED AS DETECTED CALLED TO ANGELA DOBBS 1631 10/31/23 LRL    Staphylococcus aureus (BCID) NOT DETECTED NOT DETECTED Final   Staphylococcus epidermidis DETECTED (A) NOT DETECTED Corrected    Comment: CRITICAL RESULT CALLED TO, READ BACK BY AND VERIFIED WITH: CALLED TO ANGELA DOBBS 1631 10/31/23 LRL CORRECTED ON 04/09 AT 0852: PREVIOUSLY REPORTED AS DETECTED CALLED TO ANGELA DOBBS 1631 10/31/23 LRL    Staphylococcus lugdunensis NOT DETECTED NOT DETECTED Final   Streptococcus species NOT DETECTED NOT DETECTED Final   Streptococcus agalactiae NOT DETECTED NOT DETECTED Final   Streptococcus pneumoniae NOT DETECTED NOT DETECTED Final   Streptococcus pyogenes NOT DETECTED NOT DETECTED Final   A.calcoaceticus-baumannii NOT DETECTED NOT DETECTED Final   Bacteroides fragilis NOT DETECTED NOT DETECTED Final   Enterobacterales NOT  DETECTED NOT DETECTED Final   Enterobacter cloacae complex NOT DETECTED NOT DETECTED Final   Escherichia coli NOT DETECTED NOT DETECTED Final   Klebsiella aerogenes NOT DETECTED NOT DETECTED Final   Klebsiella oxytoca NOT DETECTED NOT DETECTED Final   Klebsiella pneumoniae NOT DETECTED NOT DETECTED Final   Proteus species NOT DETECTED NOT DETECTED Final   Salmonella species NOT DETECTED NOT DETECTED Final   Serratia marcescens NOT DETECTED NOT DETECTED Final   Haemophilus influenzae NOT DETECTED NOT DETECTED Final   Neisseria meningitidis NOT DETECTED NOT DETECTED Final   Pseudomonas aeruginosa NOT DETECTED NOT DETECTED Final   Stenotrophomonas maltophilia NOT DETECTED NOT DETECTED Final   Candida albicans NOT DETECTED NOT DETECTED Final   Candida auris NOT DETECTED NOT DETECTED Final   Candida glabrata NOT DETECTED NOT DETECTED Final   Candida krusei NOT DETECTED NOT DETECTED Final   Candida parapsilosis NOT DETECTED NOT DETECTED Final   Candida tropicalis NOT DETECTED NOT DETECTED Final   Cryptococcus neoformans/gattii NOT DETECTED NOT DETECTED Final   Methicillin resistance mecA/C NOT DETECTED NOT DETECTED Final    Comment: Performed at [facility], [address] [place], [place] [address]  Respiratory (~20  pathogens) panel by PCR     Status: None   Collection Time: 10/31/23 12:07 PM   Specimen: Nasopharyngeal Swab; Respiratory  Result Value Ref Range Status   Adenovirus NOT DETECTED NOT DETECTED Final   Coronavirus 229E NOT DETECTED NOT DETECTED Final    Comment: (NOTE) The Coronavirus on the Respiratory Panel, DOES NOT test for the novel  Coronavirus (2019 nCoV)    Coronavirus HKU1 NOT DETECTED NOT DETECTED Final   Coronavirus NL63 NOT DETECTED NOT DETECTED Final   Coronavirus OC43 NOT DETECTED NOT DETECTED Final   Metapneumovirus NOT DETECTED NOT DETECTED Final   Rhinovirus / Enterovirus NOT DETECTED NOT DETECTED Final   Influenza A NOT DETECTED NOT DETECTED  Final   Influenza B NOT DETECTED NOT DETECTED Final   Parainfluenza Virus 1 NOT DETECTED NOT DETECTED Final   Parainfluenza Virus 2 NOT DETECTED NOT DETECTED Final   Parainfluenza Virus 3 NOT DETECTED NOT DETECTED Final   Parainfluenza Virus 4 NOT DETECTED NOT DETECTED Final   Respiratory Syncytial Virus NOT DETECTED NOT DETECTED Final   Bordetella pertussis NOT DETECTED NOT DETECTED Final   Bordetella Parapertussis NOT DETECTED NOT DETECTED Final   Chlamydophila pneumoniae NOT DETECTED NOT DETECTED Final   Mycoplasma pneumoniae NOT DETECTED NOT DETECTED Final    Comment: Performed at [facility], [address] [place], [place] [address]  Culture, Respiratory w Gram Stain     Status: None   Collection Time: 10/31/23  4:53 PM   Specimen: Tracheal Aspirate; Respiratory  Result Value Ref Range Status   Specimen Description   Final    TRACHEAL ASPIRATE Performed at [facility], [address] [place], [place] [address]    Special Requests   Final    NONE Performed at [facility], [address] [place], [place] [address]    Gram Stain   Final    NO WBC SEEN RARE GRAM POSITIVE COCCI RARE GRAM POSITIVE RODS    Culture   Final    ABUNDANT Normal respiratory flora-no Staph aureus or Pseudomonas seen Performed at [facility], [address] [place], [place] [address]    Report Status 11/03/2023 FINAL  Final  MRSA Next Gen by PCR, Nasal     Status: None   Collection Time: 11/01/23  6:17 PM   Specimen: Nasal Mucosa; Nasal Swab  Result Value Ref Range Status   MRSA by PCR Next Gen NOT DETECTED NOT DETECTED Final    Comment: (NOTE) The GeneXpert MRSA Assay (FDA approved for NASAL specimens only), is one component of a comprehensive MRSA colonization surveillance program. It is not intended to diagnose MRSA infection nor to guide or monitor treatment for MRSA infections. Test performance is not FDA approved in patients less than 16  years old. Performed at [facility], [address] [place], [place] [address]      Antimicrobials:  Vancomycin 4/8 x 1 Cefepime 4/8 x1 Azithromycin 4 /8> Ceftriaxon 4/8 > Metronidazole 4/8 x1  OBJECTIVE  Blood pressure 104/70, pulse 84, temperature 99.7 F (37.6 C), resp. rate (!) 23, height 5\' 9"  (1.753 m), weight (!) 188.1 kg, SpO2 96%.    Vent Mode: PRVC FiO2 (%):  [28 %-40 %] 40 % Set Rate:  [24 bmp] 24 bmp Vt Set:  [540 mL] 540 mL PEEP:  [10 cmH20-12 cmH20] 10 cmH20 Pressure Support:  [12 cmH20] 12 cmH20 Plateau Pressure:  [26 cmH20] 26 cmH20   Intake/Output Summary (Last 24 hours) at  11/04/2023 0804 Last data filed at 11/04/2023 0600 Gross per 24 hour  Intake 2588.46 ml  Output 2650 ml  Net -61.54 ml   Filed Weights   11/02/23 0319 11/03/23 0233 11/04/23 0500  Weight: (!) 188.3 kg (!) 188.1 kg (!) 188.1 kg     REVIEW OF SYSTEMS  PATIENT IS UNABLE TO PROVIDE COMPLETE REVIEW OF SYSTEMS DUE TO SEVERE CRITICAL ILLNESS   PHYSICAL EXAMINATION:  GENERAL:critically ill appearing, EYES: Pupils equal, round, reactive to light.  No scleral icterus.  MOUTH: Moist mucosal membrane. INTUBATED NECK: Supple.  PULMONARY: Lungs clear to auscultation, +rhonchi, +wheezing CARDIOVASCULAR: S1 and S2.  Regular rate and rhythm GASTROINTESTINAL: Soft, nontender, -distended. Positive bowel sounds.  MUSCULOSKELETAL: No swelling, clubbing, or edema.  NEUROLOGIC:alert and awake on precedex SKIN:normal, warm to touch, Capillary refill delayed  Pulses present bilaterally     Labs/imaging that I havepersonally reviewed  (right click and "Reselect all SmartList Selections" daily)     Labs   CBC: Recent Labs  Lab 10/30/23 2348 11/01/23 0507 11/02/23 0507 11/03/23 0405 11/04/23 0659  WBC 15.3* 16.7* 12.3* 11.4* 11.0*  NEUTROABS 10.1*  --   --   --   --   HGB 15.0 14.1 13.8 13.5 13.7  HCT 54.2* 49.7 48.1 47.6 48.2  MCV 95.1 92.2 90.4 93.3 93.1  PLT 195 179  177 153 131*    Basic Metabolic Panel: Recent Labs  Lab 11/01/23 0507 11/01/23 1409 11/02/23 0507 11/03/23 0405 11/04/23 0346 11/04/23 0659  NA 132* 134* 137 135 140 138  K 5.2* 4.7 4.5 4.6 4.4 3.7  CL 99 99 99 102 107 105  CO2 23 27 27 24 26 25   GLUCOSE 365* 287* 254* 315* 174* 173*  BUN 38* 40* 39* 35* 35* 35*  CREATININE 2.35* 1.87* 1.63* 1.36* 1.29* 1.28*  CALCIUM 7.8* 7.8* 8.5* 8.6* 9.1 9.0  MG 2.4  --  2.7* 2.6* 2.3 2.2  PHOS 5.0*  --  3.5 3.5 3.5 3.8   GFR: Estimated Creatinine Clearance: 127.7 mL/min (A) (by C-G formula based on SCr of 1.28 mg/dL (H)). Recent Labs  Lab 10/30/23 2348 10/31/23 0136 10/31/23 0447 11/01/23 0507 11/02/23 0507 11/03/23 0405 11/04/23 0659  PROCALCITON 0.27  --   --   --   --   --   --   WBC 15.3*  --   --  16.7* 12.3* 11.4* 11.0*  LATICACIDVEN  --  1.3 1.8  --   --   --   --     Liver Function Tests: Recent Labs  Lab 10/30/23 2348 11/01/23 0507 11/04/23 0659  AST 37  --  77*  ALT 46*  --  53*  ALKPHOS 61  --  39  BILITOT 0.9  --  0.8  PROT 9.0*  --  7.3  ALBUMIN 3.7 2.9* 3.0*   No results for input(s): "LIPASE", "AMYLASE" in the last 168 hours. No results for input(s): "AMMONIA" in the last 168 hours.  ABG    Component Value Date/Time   PHART 7.41 11/02/2023 0500   PCO2ART 41 11/02/2023 0500   PO2ART 80 (L) 11/02/2023 0500   HCO3 26.0 11/02/2023 0500   ACIDBASEDEF 0.3 11/01/2023 0458   O2SAT 93.7 11/02/2023 0500     Coagulation Profile: No results for input(s): "INR", "PROTIME" in the last 168 hours.  Cardiac Enzymes: No results for input(s): "CKTOTAL", "CKMB", "CKMBINDEX", "TROPONINI" in the last 168 hours.  HbA1C: Hgb A1c MFr Bld  Date/Time Value Ref Range Status  10/31/2023 06:23 PM 6.8 (H) 4.8 - 5.6 % Final    Comment:    (NOTE)         Prediabetes: 5.7 - 6.4         Diabetes: >6.4         Glycemic control for adults with diabetes: <7.0     CBG: Recent Labs  Lab 11/03/23 1940 11/03/23 2341  11/03/23 2347 11/04/23 0351 11/04/23 0743  GLUCAP 224* 225* 198* 174* 142*   Home Medications  Prior to Admission medications   Medication Sig Start Date End Date Taking? Authorizing Provider  amLODipine (NORVASC) 10 MG tablet Take 10 mg by mouth daily. 08/15/23  Yes [provider]  lisinopril (ZESTRIL) 20 MG tablet Take 20 mg by mouth daily. 08/15/23  Yes [provider]  metFORMIN (GLUCOPHAGE) 1000 MG tablet Take 1,000 mg by mouth 2 (two) times daily. 08/17/23  Yes [provider]  Scheduled Meds:  budesonide (PULMICORT) nebulizer solution  0.25 mg Nebulization BID   Chlorhexidine Gluconate Cloth  6 each Topical Daily   docusate  100 mg Per Tube BID   free water  30 mL Per Tube Q4H   heparin  5,000 Units Subcutaneous Q8H   insulin aspart  0-20 Units Subcutaneous Q4H   insulin aspart  12 Units Subcutaneous Q4H   insulin glargine-yfgn  20 Units Subcutaneous BID   ipratropium-albuterol  3 mL Nebulization Q6H   methylPREDNISolone (SOLU-MEDROL) injection  40 mg Intravenous Q24H   mouth rinse  15 mL Mouth Rinse Q2H   pantoprazole (PROTONIX) IV  40 mg Intravenous Q24H   polyethylene glycol  17 g Per Tube Daily   sodium chloride flush  10-40 mL Intracatheter Q12H   Continuous Infusions:  azithromycin Stopped (11/03/23 0953)   cefTRIAXone (ROCEPHIN)  IV 2 g (11/03/23 1951)   dexmedetomidine (PRECEDEX) IV infusion 1.2 mcg/kg/hr (11/04/23 0644)   feeding supplement (VITAL HIGH PROTEIN) 1,000 mL (11/04/23 0210)   norepinephrine (LEVOPHED) Adult infusion Stopped (11/02/23 0615)   PRN Meds:.acetaminophen, docusate, fentaNYL (SUBLIMAZE) injection, ipratropium-albuterol, midazolam, mouth rinse, polyethylene glycol, sodium chloride flush   Active Hospital Problem list   See systems below  Assessment & Plan:  43 yo morbidly obese male with Acute on Chronic Hypoxic and Hypercapnic Respiratory Failure due to CAP, 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  CTA chest negative for PE but consistent with an infectious process.    Severe ACUTE Hypoxic and Hypercapnic Respiratory Failure -continue Mechanical Ventilator support -Wean Fio2 and PEEP as tolerated -VAP/VENT bundle implementation - Wean PEEP & FiO2 as tolerated, maintain SpO2 > 88% - Head of bed elevated 30 degrees, VAP protocol in place - Plateau pressures less than 30 cm H20  - Intermittent chest x-ray & ABG PRN - Ensure adequate pulmonary hygiene  -will perform SAT/SBT when respiratory parameters are met  Vent Mode: PRVC FiO2 (%):  [28 %-40 %] 40 % Set Rate:  [24 bmp] 24 bmp Vt Set:  [540 mL] 540 mL PEEP:  [10 cmH20-12 cmH20] 10 cmH20 Pressure Support:  [12 cmH20] 12 cmH20 Plateau Pressure:  [26 cmH20] 26 cmH20    Sepsis with septic shock due to suspected pneumonia STAPH HOMINIS BACTEREMIA -F/u cultures, trend lactic/ PCT -Monitor WBC/  fever curve -IV antibiotics Ceftriaxone AND Azithromycin -Pressors PRN for MAP goal >65 -Strict I/O's   Mildly Elevated Troponin, suspect demand ischemia PMHx: HLD, HTN -Trend HS Troponin until peaked -Hold amlodipine and Lisinopril -Obtain 2D echo-high likelihood of some type of cardiomyopathy and PULM HTN 4/9 Lasix  4/10 diamox    ACUTE SYSTOLIC CARDIAC FAILURE- EF 30% -oxygen as needed -Lasix/Daimox  as tolerated -follow up cardiac enzymes as indicated  ACUTE KIDNEY INJURY/Renal Failure -continue Foley Catheter-assess need -Avoid nephrotoxic agents -Follow urine output, BMP -Ensure adequate renal perfusion, optimize oxygenation -Renal dose medications   Intake/Output Summary (Last 24 hours) at 11/04/2023 0806 Last data filed at 11/04/2023  0600 Gross per 24 hour  Intake 2588.46 ml  Output 2650 ml  Net -61.54 ml      Latest Ref Rng & Units 11/04/2023    6:59 AM 11/04/2023    3:46 AM 11/03/2023    4:05 AM  BMP  Glucose 70 - 99 mg/dL 161  096  045   BUN 6 - 20 mg/dL 35  35  35   Creatinine 0.61 - 1.24 mg/dL 4.09  8.11  9.14   Sodium 135 - 145 mmol/L 138  140  135   Potassium 3.5 - 5.1 mmol/L 3.7  4.4  4.6   Chloride 98 - 111 mmol/L 105  107  102   CO2 22 - 32 mmol/L 25  26  24    Calcium 8.9 - 10.3 mg/dL 9.0  9.1  8.6     NEUROLOGY -Avoid sedating medications as able Precedex only, awake and follows commands   ENDO - ICU hypoglycemic\Hyperglycemia protocol -check FSBS per protocol   GI GI PROPHYLAXIS as indicated NUTRITIONAL STATUS DIET-->TF's as tolerated Constipation protocol as indicated   ELECTROLYTES -follow labs as needed -replace as needed -pharmacy consultation and following  RESTRICTIVE TRANSFUSION PROTOCOL TRANSFUSION  IF HGB<7  or ACTIVE BLEEDING OR DX of ACUTE CORONARY SYNDROMES    Best practice:  Diet:  NPO Pain/Anxiety/Delirium protocol (if indicated): Yes (RASS goal -1) VAP protocol (if indicated): Yes DVT prophylaxis: Subcutaneous Heparin GI prophylaxis: PPI Glucose control:  SSI Yes Central venous access:  N/A Arterial line:  N/A Foley:  Yes, and it is still needed Mobility:  bed rest  PT consulted: N/A Last date of multidisciplinary goals of care discussion [updated wife at the bedside] Code Status:  full code Disposition: ICU       Critical Care Time devoted to patient care services described in this note is 55 minutes.  Critical care was necessary to treat /prevent imminent and life-threatening deterioration. Overall, patient is critically ill, prognosis is guarded.  Patient with Multiorgan failure and at high risk for cardiac arrest and death.    Lady Pier, M.D.  [facility]  Medical Director [facility] Medical  Director [facility] Cardio-Pulmonary Department

## 2023-11-04 NOTE — Plan of Care (Signed)
  Problem: Health Behavior/Discharge Planning: Goal: Ability to manage health-related needs will improve Outcome: Progressing   Problem: Health Behavior/Discharge Planning: Goal: Ability to manage health-related needs will improve Outcome: Progressing   Problem: Clinical Measurements: Goal: Will remain free from infection Outcome: Progressing Goal: Diagnostic test results will improve Outcome: Progressing Goal: Respiratory complications will improve Outcome: Progressing   Problem: Clinical Measurements: Goal: Will remain free from infection Outcome: Progressing   Problem: Clinical Measurements: Goal: Diagnostic test results will improve Outcome: Progressing   Problem: Clinical Measurements: Goal: Respiratory complications will improve Outcome: Progressing   Problem: Nutrition: Goal: Adequate nutrition will be maintained Outcome: Progressing   Problem: Nutrition: Goal: Adequate nutrition will be maintained Outcome: Progressing

## 2023-11-04 NOTE — Plan of Care (Signed)

## 2023-11-04 NOTE — Progress Notes (Signed)
 PHARMACY CONSULT NOTE - ELECTROLYTES  Pharmacy Consult for Electrolyte Monitoring and Replacement   Recent Labs: Height: 5\' 9"  (175.3 cm) Weight: (!) 188.1 kg (414 lb 11 oz) IBW/kg (Calculated) : 70.7 Estimated Creatinine Clearance: 127.7 mL/min (A) (by C-G formula based on SCr of 1.28 mg/dL (H)). Potassium (mmol/L)  Date Value  11/04/2023 3.7   Magnesium (mg/dL)  Date Value  57/84/6962 2.2   Calcium (mg/dL)  Date Value  95/28/4132 9.0   Albumin (g/dL)  Date Value  44/07/270 3.0 (L)   Phosphorus (mg/dL)  Date Value  53/66/4403 3.8   Sodium (mmol/L)  Date Value  11/04/2023 138    Assessment  Clifford Nichols is a 43 y.o. male presenting with SHOB. PMH significant for obesity, obstructive sleep apnea, chronic respiratory failure on 4 L of oxygen, DM, HTN. Pharmacy has been consulted to monitor and replace electrolytes.  Goal of Therapy: Electrolytes WNL  Plan:  No replacement indicated Check electrolytes with AM labs  Thank you for allowing pharmacy to be a part of this patient's care.  Adalberto Acton, PharmD Clinical Pharmacist 11/04/2023 11:40 AM

## 2023-11-05 LAB — CULTURE, BLOOD (ROUTINE X 2)
Culture: NO GROWTH
Special Requests: ADEQUATE

## 2023-11-05 LAB — BASIC METABOLIC PANEL WITH GFR
Anion gap: 9 (ref 5–15)
BUN: 37 mg/dL — ABNORMAL HIGH (ref 6–20)
CO2: 26 mmol/L (ref 22–32)
Calcium: 9.4 mg/dL (ref 8.9–10.3)
Chloride: 108 mmol/L (ref 98–111)
Creatinine, Ser: 1.2 mg/dL (ref 0.61–1.24)
GFR, Estimated: >60 mL/min (ref >60)
Glucose, Bld: 95 mg/dL (ref 70–99)
Potassium: 3.9 mmol/L (ref 3.5–5.1)
Sodium: 143 mmol/L (ref 135–145)

## 2023-11-05 LAB — CBC
HCT: 51 % (ref 39.0–52.0)
Hemoglobin: 14.3 g/dL (ref 13.0–17.0)
MCH: 26.4 pg (ref 26.0–34.0)
MCHC: 28 g/dL — ABNORMAL LOW (ref 30.0–36.0)
MCV: 94.3 fL (ref 80.0–100.0)
Platelets: 152 10*3/uL (ref 150–400)
RBC: 5.41 MIL/uL (ref 4.22–5.81)
RDW: 15.2 % (ref 11.5–15.5)
WBC: 11.5 10*3/uL — ABNORMAL HIGH (ref 4.0–10.5)
nRBC: 0 % (ref 0.0–0.2)

## 2023-11-05 LAB — GLUCOSE, CAPILLARY
Glucose-Capillary: 136 mg/dL — ABNORMAL HIGH (ref 70–99)
Glucose-Capillary: 139 mg/dL — ABNORMAL HIGH (ref 70–99)
Glucose-Capillary: 185 mg/dL — ABNORMAL HIGH (ref 70–99)
Glucose-Capillary: 85 mg/dL (ref 70–99)
Glucose-Capillary: 88 mg/dL (ref 70–99)
Glucose-Capillary: 89 mg/dL (ref 70–99)

## 2023-11-05 LAB — MAGNESIUM: Magnesium: 2.2 mg/dL (ref 1.7–2.4)

## 2023-11-05 MED ORDER — DOCUSATE SODIUM 50 MG/5ML PO LIQD
100.0000 mg | Freq: Two times a day (BID) | ORAL | Status: DC
  Administered 2023-11-05: 100 mg via ORAL
  Filled 2023-11-05 (×2): qty 10

## 2023-11-05 MED ORDER — ORAL CARE MOUTH RINSE: 15.0000 mL | OROMUCOSAL | Status: DC | PRN

## 2023-11-05 MED ORDER — ORAL CARE MOUTH RINSE
15.0000 mL | OROMUCOSAL | Status: DC
  Administered 2023-11-05 – 2023-11-13 (×25): 15 mL via OROMUCOSAL

## 2023-11-05 MED ORDER — POLYETHYLENE GLYCOL 3350 17 G PO PACK
17.0000 g | PACK | Freq: Every day | ORAL | Status: DC | PRN
  Filled 2023-11-05: qty 1

## 2023-11-05 MED ORDER — ACETAMINOPHEN 325 MG PO TABS: 650.0000 mg | ORAL_TABLET | Freq: Four times a day (QID) | ORAL | Status: DC | PRN

## 2023-11-05 MED ORDER — POLYETHYLENE GLYCOL 3350 17 G PO PACK: 17.0000 g | PACK | Freq: Every day | ORAL | Status: DC

## 2023-11-05 MED ORDER — DOCUSATE SODIUM 50 MG/5ML PO LIQD
100.0000 mg | Freq: Two times a day (BID) | ORAL | Status: DC | PRN
  Administered 2023-11-05: 100 mg via ORAL
  Filled 2023-11-05: qty 10

## 2023-11-05 NOTE — Plan of Care (Addendum)
 The patient remains on AR-ICU at time of writing. The patient remains on heated HFNC during the day and BiPAP at HS. CVC an Foley catheter remain in place. No active infusions.   Edit 0420 hours: Foley catheter and CVC removed at this time.   Problem: Education: Goal: Knowledge of General Education information will improve Description: Including pain rating scale, medication(s)/side effects and non-pharmacologic comfort measures Outcome: Progressing   Problem: Health Behavior/Discharge Planning: Goal: Ability to manage health-related needs will improve Outcome: Progressing   Problem: Clinical Measurements: Goal: Ability to maintain clinical measurements within normal limits will improve Outcome: Progressing Goal: Will remain free from infection Outcome: Progressing Goal: Diagnostic test results will improve Outcome: Progressing Goal: Respiratory complications will improve Outcome: Progressing Goal: Cardiovascular complication will be avoided Outcome: Progressing   Problem: Activity: Goal: Risk for activity intolerance will decrease Outcome: Progressing   Problem: Nutrition: Goal: Adequate nutrition will be maintained Outcome: Progressing   Problem: Coping: Goal: Level of anxiety will decrease Outcome: Progressing   Problem: Elimination: Goal: Will not experience complications related to bowel motility Outcome: Progressing Goal: Will not experience complications related to urinary retention Outcome: Progressing   Problem: Pain Managment: Goal: General experience of comfort will improve and/or be controlled Outcome: Progressing   Problem: Safety: Goal: Ability to remain free from injury will improve Outcome: Progressing   Problem: Skin Integrity: Goal: Risk for impaired skin integrity will decrease Outcome: Progressing   Problem: Education: Goal: Ability to describe self-care measures that may prevent or decrease complications (Diabetes Survival Skills Education)  will improve Outcome: Progressing Goal: Individualized Educational Video(s) Outcome: Progressing   Problem: Coping: Goal: Ability to adjust to condition or change in health will improve Outcome: Progressing   Problem: Fluid Volume: Goal: Ability to maintain a balanced intake and output will improve Outcome: Progressing   Problem: Health Behavior/Discharge Planning: Goal: Ability to identify and utilize available resources and services will improve Outcome: Progressing Goal: Ability to manage health-related needs will improve Outcome: Progressing   Problem: Metabolic: Goal: Ability to maintain appropriate glucose levels will improve Outcome: Progressing   Problem: Nutritional: Goal: Maintenance of adequate nutrition will improve Outcome: Progressing Goal: Progress toward achieving an optimal weight will improve Outcome: Progressing   Problem: Skin Integrity: Goal: Risk for impaired skin integrity will decrease Outcome: Progressing   Problem: Tissue Perfusion: Goal: Adequacy of tissue perfusion will improve Outcome: Progressing   Problem: Activity: Goal: Ability to tolerate increased activity will improve Outcome: Progressing   Problem: Respiratory: Goal: Ability to maintain a clear airway and adequate ventilation will improve Outcome: Progressing   Problem: Role Relationship: Goal: Method of communication will improve Outcome: Progressing   Problem: Education: Goal: Ability to demonstrate management of disease process will improve Outcome: Progressing Goal: Ability to verbalize understanding of medication therapies will improve Outcome: Progressing Goal: Individualized Educational Video(s) Outcome: Progressing   Problem: Activity: Goal: Capacity to carry out activities will improve Outcome: Progressing   Problem: Cardiac: Goal: Ability to achieve and maintain adequate cardiopulmonary perfusion will improve Outcome: Progressing

## 2023-11-05 NOTE — Plan of Care (Signed)
  Problem: Education: Goal: Knowledge of General Education information will improve Description: Including pain rating scale, medication(s)/side effects and non-pharmacologic comfort measures Outcome: Progressing   Problem: Clinical Measurements: Goal: Ability to maintain clinical measurements within normal limits will improve Outcome: Progressing Goal: Respiratory complications will improve Outcome: Progressing   Problem: Metabolic: Goal: Ability to maintain appropriate glucose levels will improve Outcome: Progressing   Problem: Skin Integrity: Goal: Risk for impaired skin integrity will decrease Outcome: Progressing   Problem: Activity: Goal: Ability to tolerate increased activity will improve Outcome: Progressing

## 2023-11-05 NOTE — Progress Notes (Signed)
 PHARMACY CONSULT NOTE - ELECTROLYTES  Pharmacy Consult for Electrolyte Monitoring and Replacement   Recent Labs: Height: 5\' 9"  (175.3 cm) Weight: (!) 188.1 kg (414 lb 11 oz) IBW/kg (Calculated) : 70.7 Estimated Creatinine Clearance: 136.2 mL/min (by C-G formula based on SCr of 1.2 mg/dL). Potassium (mmol/L)  Date Value  11/05/2023 3.9   Magnesium (mg/dL)  Date Value  16/04/9603 2.2   Calcium (mg/dL)  Date Value  54/03/8118 9.4   Albumin (g/dL)  Date Value  14/78/2956 3.0 (L)   Phosphorus (mg/dL)  Date Value  21/30/8657 3.8   Sodium (mmol/L)  Date Value  11/05/2023 143    Assessment  Clifford Nichols is a 43 y.o. male presenting with SHOB. PMH significant for obesity, obstructive sleep apnea, chronic respiratory failure on 4 L of oxygen, DM, HTN. Pharmacy has been consulted to monitor and replace electrolytes.  Goal of Therapy: Electrolytes WNL  Plan:  No replacement indicated Check electrolytes with AM labs  Thank you for allowing pharmacy to be a part of this patient's care.  Adalberto Acton, PharmD Clinical Pharmacist 11/05/2023 7:10 AM

## 2023-11-05 NOTE — Evaluation (Signed)
 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 Now extubated  Clinical Impression  Patient is agreeable to PT evaluation. He is eager to get out of bed to the chair if possible. He lives with his spouse at home and uses a rolling walker for ambulation at baseline.  Today the patient required Min A for bed mobility for trunk support. Sitting balance is fair. He completed stand step transfer from bed to chair with Min A +2 person for safety with cues for technique. Dyspnea with exertion with limited overall activity tolerance in standing. Sp02 in the 90's on 55L HHFNC after getting up to the chair. Recommend to continue PT to maximize independence. Patient wants to return home at discharge. PT will continue to follow to maximize independence and decrease caregiver burden.       If plan is discharge home, recommend the following: A little help with walking and/or transfers;A little help with bathing/dressing/bathroom;Assist for transportation;Help with stairs or ramp for entrance   Can travel by private vehicle        Equipment Recommendations None recommended by PT  Recommendations for Other Services  OT consult    Functional Status Assessment Patient has had a recent decline in their functional status and demonstrates the ability to make significant improvements in function in a reasonable and predictable amount of time.     Precautions / Restrictions Precautions Precautions: Fall Recall of Precautions/Restrictions: Intact Precaution/Restrictions Comments: monitor O2 Restrictions Weight Bearing Restrictions Per Provider Order: No       Mobility  Bed Mobility Overal bed mobility: Needs Assistance Bed Mobility: Supine to Sit     Supine to sit: Min assist     General bed mobility comments: trunk support provided. increased time required    Transfers Overall transfer level: Needs assistance Equipment used: Rolling walker (2 wheels) Transfers: Bed to chair/wheelchair/BSC     Step pivot transfers: Min assist, +2 safety/equipment       General transfer comment: verbal cues for technique.    Ambulation/Gait             Pre-gait activities: patient is able to complete several mini squats with rolling walker for support with no knee buckling General Gait Details: not attempted due to fatigue with activity.  Stairs            Wheelchair Mobility     Tilt Bed    Modified Rankin (Stroke Patients Only)       Balance Overall balance assessment: Needs assistance Sitting-balance support: Feet supported Sitting balance-Leahy Scale: Fair     Standing balance support: Bilateral upper extremity supported Standing balance-Leahy Scale: Fair Standing balance comment: with reliance on rolling walker for support in standing                             Pertinent Vitals/Pain Pain Assessment Pain Assessment: No/denies pain    Home Living Family/patient expects to be discharged to:: Private residence Living Arrangements: Spouse/significant other;Children Available Help at Discharge: Family Type of Home: House Home Access: Stairs to enter   Secretary/administrator of Steps: 4     Home Equipment: Agricultural consultant (  2 wheels);Shower seat Additional Comments: 4 L02 per chart    Prior Function Prior Level of Function : Independent/Modified Independent             Mobility Comments: Mod I with ambulation using rolling walker. He reports he does not go out of the house often       Extremity/Trunk Assessment   Upper Extremity Assessment Upper Extremity Assessment: Generalized  weakness    Lower Extremity Assessment Lower Extremity Assessment: Generalized weakness       Communication   Communication Communication: Impaired Factors Affecting Communication: Difficulty expressing self (low tone of voice)    Cognition Arousal: Alert Behavior During Therapy: WFL for tasks assessed/performed   PT - Cognitive impairments: No apparent impairments                         Following commands: Intact       Cueing Cueing Techniques: Verbal cues     General Comments General comments (skin integrity, edema, etc.): Sp02 in the 90's after getting up to the chair. dyspnea with exertion. heart rate 130's with mobility    Exercises     Assessment/Plan    PT Assessment Patient needs continued PT services  PT Problem List Decreased strength;Decreased range of motion;Decreased activity tolerance;Decreased balance;Decreased mobility;Obesity;Decreased safety awareness;Cardiopulmonary status limiting activity       PT Treatment Interventions DME instruction;Gait training;Stair training;Functional mobility training;Therapeutic activities;Therapeutic exercise;Balance training;Patient/family education;Neuromuscular re-education;Cognitive remediation    PT Goals (Current goals can be found in the Care Plan section)  Acute Rehab PT Goals Patient Stated Goal: to go home PT Goal Formulation: With patient/family Time For Goal Achievement: 11/19/23 Potential to Achieve Goals: Fair    Frequency Min 2X/week     Co-evaluation               AM-PAC PT "6 Clicks" Mobility  Outcome Measure Help needed turning from your back to your side while in a flat bed without using bedrails?: A Little Help needed moving from lying on your back to sitting on the side of a flat bed without using bedrails?: A Little Help needed moving to and from a bed to a chair (including a wheelchair)?: A Little Help needed standing up from a chair using your arms (e.g., wheelchair or  bedside chair)?: A Lot Help needed to walk in hospital room?: A Lot Help needed climbing 3-5 steps with a railing? : Total 6 Click Score: 14    End of Session Equipment Utilized During Treatment: Oxygen Activity Tolerance: Patient limited by fatigue;Patient tolerated treatment well Patient left: in chair;with call bell/phone within reach;with family/visitor present Nurse Communication: Mobility status PT Visit Diagnosis: Muscle weakness (generalized) (M62.81);Difficulty in walking, not elsewhere classified (R26.2)    Time: 1420-1440 PT Time Calculation (min) (ACUTE ONLY): 20 min   Charges:   PT Evaluation $PT Eval High Complexity: 1 High   PT General Charges $$ ACUTE PT VISIT: 1 Visit        Ozie Bo, PT, MPT   Erlene Hawks 11/05/2023, 2:56 PM

## 2023-11-06 DIAGNOSIS — J9621 Acute and chronic respiratory failure with hypoxia: Secondary | ICD-10-CM | POA: Diagnosis not present

## 2023-11-06 DIAGNOSIS — J9622 Acute and chronic respiratory failure with hypercapnia: Secondary | ICD-10-CM | POA: Diagnosis not present

## 2023-11-06 LAB — GLUCOSE, CAPILLARY
Glucose-Capillary: 106 mg/dL — ABNORMAL HIGH (ref 70–99)
Glucose-Capillary: 117 mg/dL — ABNORMAL HIGH (ref 70–99)
Glucose-Capillary: 177 mg/dL — ABNORMAL HIGH (ref 70–99)
Glucose-Capillary: 208 mg/dL — ABNORMAL HIGH (ref 70–99)
Glucose-Capillary: 219 mg/dL — ABNORMAL HIGH (ref 70–99)
Glucose-Capillary: 88 mg/dL (ref 70–99)

## 2023-11-06 LAB — CBC
HCT: 47.4 % (ref 39.0–52.0)
Hemoglobin: 13.6 g/dL (ref 13.0–17.0)
MCH: 26.7 pg (ref 26.0–34.0)
MCHC: 28.7 g/dL — ABNORMAL LOW (ref 30.0–36.0)
MCV: 93.1 fL (ref 80.0–100.0)
Platelets: 168 10*3/uL (ref 150–400)
RBC: 5.09 MIL/uL (ref 4.22–5.81)
RDW: 15.4 % (ref 11.5–15.5)
WBC: 11.5 10*3/uL — ABNORMAL HIGH (ref 4.0–10.5)
nRBC: 0 % (ref 0.0–0.2)

## 2023-11-06 LAB — BASIC METABOLIC PANEL WITH GFR
Anion gap: 10 (ref 5–15)
BUN: 39 mg/dL — ABNORMAL HIGH (ref 6–20)
CO2: 25 mmol/L (ref 22–32)
Calcium: 8.9 mg/dL (ref 8.9–10.3)
Chloride: 105 mmol/L (ref 98–111)
Creatinine, Ser: 1.17 mg/dL (ref 0.61–1.24)
GFR, Estimated: >60 mL/min (ref >60)
Glucose, Bld: 100 mg/dL — ABNORMAL HIGH (ref 70–99)
Potassium: 3.3 mmol/L — ABNORMAL LOW (ref 3.5–5.1)
Sodium: 140 mmol/L (ref 135–145)

## 2023-11-06 LAB — PHOSPHORUS: Phosphorus: 3.7 mg/dL (ref 2.5–4.6)

## 2023-11-06 LAB — MAGNESIUM: Magnesium: 2.2 mg/dL (ref 1.7–2.4)

## 2023-11-06 MED ORDER — POTASSIUM CHLORIDE 10 MEQ/100ML IV SOLN
10.0000 meq | INTRAVENOUS | Status: AC
  Administered 2023-11-06 (×6): 10 meq via INTRAVENOUS
  Filled 2023-11-06 (×9): qty 100

## 2023-11-06 MED ORDER — ADULT MULTIVITAMIN W/MINERALS CH
1.0000 | ORAL_TABLET | Freq: Every day | ORAL | Status: DC
  Administered 2023-11-07 – 2023-11-15 (×8): 1 via ORAL
  Filled 2023-11-06 (×8): qty 1

## 2023-11-06 MED ORDER — ENOXAPARIN SODIUM 80 MG/0.8ML IJ SOSY
80.0000 mg | PREFILLED_SYRINGE | INTRAMUSCULAR | Status: DC
  Administered 2023-11-06 – 2023-11-10 (×5): 80 mg via SUBCUTANEOUS
  Filled 2023-11-06 (×5): qty 0.8

## 2023-11-06 MED ORDER — SODIUM CHLORIDE 0.9 % IV SOLN: INTRAVENOUS | Status: AC | PRN

## 2023-11-06 MED ORDER — SIMETHICONE 80 MG PO CHEW
80.0000 mg | CHEWABLE_TABLET | Freq: Four times a day (QID) | ORAL | Status: DC | PRN
  Administered 2023-11-06 (×2): 80 mg via ORAL
  Filled 2023-11-06 (×3): qty 1

## 2023-11-06 MED ORDER — ENSURE MAX PROTEIN PO LIQD
11.0000 [oz_av] | Freq: Three times a day (TID) | ORAL | Status: DC
  Administered 2023-11-07 – 2023-11-12 (×11): 11 [oz_av] via ORAL
  Filled 2023-11-06: qty 330

## 2023-11-06 MED ORDER — MELATONIN 5 MG PO TABS
5.0000 mg | ORAL_TABLET | Freq: Every evening | ORAL | Status: DC | PRN
  Administered 2023-11-06: 5 mg via ORAL
  Filled 2023-11-06: qty 1

## 2023-11-06 NOTE — Progress Notes (Signed)
 [facility] ADULT ICU REPLACEMENT PROTOCOL   The patient does apply for the [facility] Adult ICU Electrolyte Replacment Protocol based on the criteria listed below:   1.Exclusion criteria: TCTS, ECMO, Dialysis, and Myasthenia Gravis patients 2. Is GFR >/= 30 ml/min? Yes.    Patient's GFR today is >60 3. Is SCr </= 2? Yes.   Patient's SCr is 1.17 mg/dL 4. Did SCr increase >/= 0.5 in 24 hours? No. 5.Pt's weight >40kg  Yes.   6. Abnormal electrolyte(s): K  7. Electrolytes replaced per protocol 8.  Call MD STAT for K+ </= 2.5, Phos </= 1, or Mag </= 1 Physician:  Rowland Copas [facility] 11/06/2023 4:46 AM

## 2023-11-06 NOTE — Progress Notes (Signed)
 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 Now extubated    PT Comments  Patient is agreeable to PT session. He was seated in the chair already with family member at the bedside. No physical lifting assistance required for standing. 2 bouts of standing performed. Emphasis on increasing standing tolerance. Patient stood for 2 minutes and then 1.5 minutes with short rest break between bouts of standing. Sp02 85-90% with activity on 55L02. Recommend to continue PT to maximize independence and decrease caregiver burden. Patient is hopeful for discharge home.    If plan is discharge home, recommend the following: A little help with walking and/or transfers;A little help with bathing/dressing/bathroom;Assist for transportation;Help with stairs or ramp for entrance   Can travel by private vehicle        Equipment Recommendations  None recommended by PT    Recommendations for Other Services OT consult     Precautions / Restrictions Precautions Precautions: Fall Recall of Precautions/Restrictions: Intact Precaution/Restrictions Comments: monitor O2 Restrictions Weight Bearing Restrictions Per Provider Order: No     Mobility  Bed Mobility               General bed mobility comments: not assessed as patient sitting up on arrival and post session    Transfers Overall transfer level: Needs assistance Equipment used: Rolling walker (2 wheels) Transfers: Sit to/from Stand Sit to Stand: Contact guard assist           General transfer comment: 2 standing bouts with cues for safety. no physical lifting assistance required  for standing today    Ambulation/Gait             Pre-gait activities: emphasis on increasing standing tolerance in preparation for walking. patient able to stand for 2 minutes and 1.5 minutes with no loss of balance using rolling walker. activity tolerance limited first by leg fatigue and then endurance/dyspnea with exertion General Gait Details: not attempted, limited by high flow oxygen tubing   Stairs             Wheelchair Mobility     Tilt Bed    Modified Rankin (Stroke Patients Only)       Balance Overall balance assessment: Needs assistance Sitting-balance support: Feet supported Sitting balance-Leahy Scale: Fair     Standing balance support: Bilateral upper extremity supported Standing balance-Leahy Scale: Fair Standing balance comment: using rolling walker for support                            Communication Communication Communication: Impaired Factors Affecting Communication: Difficulty expressing self  Cognition Arousal: Alert Behavior During Therapy: WFL for tasks assessed/performed   PT - Cognitive impairments: No apparent impairments                         Following commands: Intact      Cueing Cueing Techniques: Verbal cues  Exercises      General Comments General comments (skin integrity, edema, etc.): Sp02 85-90% on 55 L02      Pertinent Vitals/Pain Pain Assessment Pain Assessment: No/denies pain    Home Living  Prior Function            PT Goals (current goals can now be found in the care plan section) Acute Rehab PT Goals Patient Stated Goal: to go home PT Goal Formulation: With patient/family Time For Goal Achievement: 11/19/23 Potential to Achieve Goals: Fair Progress towards PT goals: Progressing toward goals    Frequency    Min 2X/week      PT Plan      Co-evaluation              AM-PAC PT "6 Clicks" Mobility   Outcome Measure  Help  needed turning from your back to your side while in a flat bed without using bedrails?: A Little Help needed moving from lying on your back to sitting on the side of a flat bed without using bedrails?: A Little Help needed moving to and from a bed to a chair (including a wheelchair)?: A Little Help needed standing up from a chair using your arms (e.g., wheelchair or bedside chair)?: A Little Help needed to walk in hospital room?: A Little Help needed climbing 3-5 steps with a railing? : Total 6 Click Score: 16    End of Session Equipment Utilized During Treatment: Oxygen Activity Tolerance: Patient limited by fatigue;Patient tolerated treatment well Patient left: in chair;with call bell/phone within reach;with family/visitor present Nurse Communication: Mobility status PT Visit Diagnosis: Muscle weakness (generalized) (M62.81);Difficulty in walking, not elsewhere classified (R26.2)     Time: 1610-9604 PT Time Calculation (min) (ACUTE ONLY): 16 min  Charges:    $Therapeutic Activity: 8-22 mins PT General Charges $$ ACUTE PT VISIT: 1 Visit                     Clifford Nichols, PT, MPT    Clifford Nichols 11/06/2023, 11:15 AM

## 2023-11-06 NOTE — Plan of Care (Signed)
  Problem: Education: Goal: Knowledge of General Education information will improve Description: Including pain rating scale, medication(s)/side effects and non-pharmacologic comfort measures Outcome: Progressing   Problem: Health Behavior/Discharge Planning: Goal: Ability to manage health-related needs will improve Outcome: Progressing   Problem: Clinical Measurements: Goal: Ability to maintain clinical measurements within normal limits will improve Outcome: Progressing Goal: Will remain free from infection Outcome: Progressing Goal: Diagnostic test results will improve Outcome: Progressing Goal: Respiratory complications will improve Outcome: Progressing Goal: Cardiovascular complication will be avoided Outcome: Progressing   Problem: Activity: Goal: Risk for activity intolerance will decrease Outcome: Progressing   Problem: Nutrition: Goal: Adequate nutrition will be maintained Outcome: Progressing   Problem: Coping: Goal: Level of anxiety will decrease Outcome: Progressing   Problem: Elimination: Goal: Will not experience complications related to bowel motility Outcome: Progressing Goal: Will not experience complications related to urinary retention Outcome: Progressing   Problem: Pain Managment: Goal: General experience of comfort will improve and/or be controlled Outcome: Progressing   Problem: Safety: Goal: Ability to remain free from injury will improve Outcome: Progressing   Problem: Skin Integrity: Goal: Risk for impaired skin integrity will decrease Outcome: Progressing   Problem: Education: Goal: Ability to describe self-care measures that may prevent or decrease complications (Diabetes Survival Skills Education) will improve Outcome: Progressing Goal: Individualized Educational Video(s) Outcome: Progressing   Problem: Coping: Goal: Ability to adjust to condition or change in health will improve Outcome: Progressing   Problem: Fluid  Volume: Goal: Ability to maintain a balanced intake and output will improve Outcome: Progressing   Problem: Health Behavior/Discharge Planning: Goal: Ability to identify and utilize available resources and services will improve Outcome: Progressing Goal: Ability to manage health-related needs will improve Outcome: Progressing   Problem: Metabolic: Goal: Ability to maintain appropriate glucose levels will improve Outcome: Progressing   Problem: Nutritional: Goal: Maintenance of adequate nutrition will improve Outcome: Progressing Goal: Progress toward achieving an optimal weight will improve Outcome: Progressing   Problem: Skin Integrity: Goal: Risk for impaired skin integrity will decrease Outcome: Progressing   Problem: Tissue Perfusion: Goal: Adequacy of tissue perfusion will improve Outcome: Progressing   Problem: Activity: Goal: Ability to tolerate increased activity will improve Outcome: Progressing   Problem: Respiratory: Goal: Ability to maintain a clear airway and adequate ventilation will improve Outcome: Progressing   Problem: Role Relationship: Goal: Method of communication will improve Outcome: Progressing   Problem: Education: Goal: Ability to demonstrate management of disease process will improve Outcome: Progressing Goal: Ability to verbalize understanding of medication therapies will improve Outcome: Progressing Goal: Individualized Educational Video(s) Outcome: Progressing   Problem: Activity: Goal: Capacity to carry out activities will improve Outcome: Progressing   Problem: Cardiac: Goal: Ability to achieve and maintain adequate cardiopulmonary perfusion will improve Outcome: Progressing

## 2023-11-06 NOTE — Progress Notes (Signed)
 Progress Note   Patient: Clifford Nichols JXB:[id number] DOB: 07/16/1981 DOA: 10/30/2023     6 DOS: the patient was seen and examined on 11/06/2023   Brief hospital course: 43 y.o with significant PMH of  severe OSA, chronic respiraotry failure on 4L, extreme obesity with Alveolar hypoventilation, DM and HTN who presented to the ED with chief complaints of progressive shortness of breath.   Per ED reports, EMS was called for worsening respiratory distress. On EMS arrival, patient was found with oxygen saturation in the 80's on his normal 4L. He was placed on NRB and then transitioned to CPAP. He was treated with Duonebs and IV solumedrol enroute, shortly after he became unresponsive.   ED Course: Initial vital signs showed HR of 96 beats/minute, BP 136/66mm Hg, the RR 28breaths/minute, and the oxygen saturation 83% on CPAP and a temperature of 101.47F (38.4C). Patient was lethargic, moaned intermittently, and responded with one-word answers; symmetric movement in the arms and legs was observed. Due to worsening mental status on CPAP he was intubated for airway protection.  Patient was subsequently extubated and transferred to [facility] service on 11/06/2023. Other hospital course as noted below   Assessment and Plan:   Severe ACUTE on chronic hypoxic and Hypercapnic Respiratory Failure Likely multifactorial in the setting of obesity hypoventilation syndrome as well as severe bilateral pneumonia CTA chest negative for PE but consistent with an infectious process. Patient extubated on 11/05/2023 Currently requiring 68% of high flow intranasal oxygen at flow rate of 55 L According to patient he uses 4 L of oxygen at home Continue to wean down oxygen as tolerated Patient has completed antibiotic course Hopefully transition from IV Solu-Medrol to oral prednisone taper tomorrow     Sepsis with septic shock due to suspected pneumonia STAPH HOMINIS BACTEREMIA Has completed antibiotic course Monitor  respiratory function as above  Mildly Elevated Troponin, suspect demand ischemia PMHx: HLD, HTN -Trend HS Troponin until peaked -Hold amlodipine and Lisinopril Follow-up on echocardiogram    Hypokalemia-continue to replete and monitor closely   ACUTE SYSTOLIC CARDIAC FAILURE- EF 30% Cardiomyopathy and PULM HTN Lasix as tolerated Monitor daily weight Monitor input and output GDMT as blood pressure allows   ACUTE KIDNEY INJURY/Renal Failure-improved Monitor renal function closely Avoid nephrotoxic agents   Diabetes mellitus type 2 Monitor glucose closely Continue insulin therapy at this time  Morbid obesity with BMI 53.7 Counseled on weight loss with diet and exercise when medically stable  Family communication.  Discussed with patient's wife at bedside  Subjective:  Patient seen and examined at bedside this morning Currently requiring 68% FiO2 with a flow rate of 55 L Admits to improvement in respiratory function Denies chest pain nausea vomiting or abdominal pain  Physical Exam: GENERAL:critically ill appearing, on high flow nasal oxygen EYES: Pupils equal, round, reactive to light.  No scleral icterus.  MOUTH: Moist mucosal membrane.  NECK: Supple.  PULMONARY: Lungs clear to auscultation, CARDIOVASCULAR: S1 and S2.  Regular rate and rhythm GASTROINTESTINAL: Soft, nontender, -distended. Positive bowel sounds.  MUSCULOSKELETAL: No swelling, clubbing, or edema.  NEUROLOGIC: Alert and oriented x 3 SKIN:normal, warm to touch, Capillary refill delayed  Pulses present bilaterally  Disposition: Pending improvement in respiratory function as patient is currently on high flow nasal OXYGENATION  Vitals:   11/06/23 1300 11/06/23 1348 11/06/23 1400 11/06/23 1500  BP:      Pulse: (!) 105  (!) 116 (!) 109  Resp: (!) 25  (!) 21   Temp:  TempSrc:      SpO2: (!) 86% 91% (!) 89% (!) 88%  Weight:      Height:        Data Reviewed: Chest x-ray reviewed    Latest Ref  Rng & Units 11/06/2023    4:06 AM 11/05/2023    4:47 AM 11/04/2023    6:59 AM  CBC  WBC 4.0 - 10.5 K/uL 11.5  11.5  11.0   Hemoglobin 13.0 - 17.0 g/dL 40.9  81.1  91.4   Hematocrit 39.0 - 52.0 % 47.4  51.0  48.2   Platelets 150 - 400 K/uL 168  152  131        Latest Ref Rng & Units 11/06/2023    4:06 AM 11/05/2023    4:47 AM 11/04/2023    6:59 AM  BMP  Glucose 70 - 99 mg/dL 782  95  956   BUN 6 - 20 mg/dL 39  37  35   Creatinine 0.61 - 1.24 mg/dL 2.13  0.86  5.78   Sodium 135 - 145 mmol/L 140  143  138   Potassium 3.5 - 5.1 mmol/L 3.3  3.9  3.7   Chloride 98 - 111 mmol/L 105  108  105   CO2 22 - 32 mmol/L 25  26  25    Calcium 8.9 - 10.3 mg/dL 8.9  9.4  9.0      Time spent: 53 minutes  Author: Ezzard Holms, MD 11/06/2023 4:03 PM  For on call review [email or web address].

## 2023-11-06 NOTE — Progress Notes (Addendum)
 Nutrition Follow Up Note   DOCUMENTATION CODES:   Morbid obesity  INTERVENTION:   Ensure Max protein supplement po TID, each supplement provides 150kcal and 30g of protein.  MVI po daily   Liberalize diet   Daily weights   NUTRITION DIAGNOSIS:   Inadequate oral intake related to inability to eat (pt sedated and ventilatd) as evidenced by NPO status. -resolved   GOAL:   Patient will meet greater than or equal to 90% of their needs -progressing   MONITOR:   PO intake, Supplement acceptance, Labs, Weight trends, I & O's, Skin  ASSESSMENT:   43 y/o male with h/o OSA, chronic respiraotry failure on 4L, extreme obesity with alveolar hypoventilation, DM, HTN and a lengthy admission for COVID 19/ARDS requiring tracheostmy/cortrak tube 2021 and who is now admitted with CAP, sepsis and AKI.  Met with pt in room today. Pt extubated 4/12. Pt initiated on a diet yesterday. Pt reports fairly good appetite and oral intake but is complaining of bloating and gas pain today; simethicone ordered. Pt ate 50% of his lunch. RD discussed with pt the importance of adequate nutrition needed to preserve lean muscle. Pt is agreeable to drinking strawberry Ensure. Per chart, pt appears to be down ~51lbs since admission. RD unsure of pt's UBW.    Medications reviewed and include: lovenox,  insulin, solu-medrol  Labs reviewed: K 3.3(L), BUN 39(H), P 3.7 wnl, Mg 2.2 wnl Wbc- 11.5(H) Cbgs- 177, 117, 106 x 24 hrs   UOP-   NUTRITION - FOCUSED PHYSICAL EXAM:  Flowsheet Row Most Recent Value  Orbital Region No depletion  Upper Arm Region No depletion  Thoracic and Lumbar Region No depletion  Buccal Region No depletion  Temple Region No depletion  Clavicle Bone Region No depletion  Clavicle and Acromion Bone Region No depletion  Scapular Bone Region No depletion  Dorsal Hand No depletion  Patellar Region No depletion  Anterior Thigh Region No depletion  Posterior Calf Region No depletion   Edema (RD Assessment) None  Hair Reviewed  Eyes Reviewed  Mouth Reviewed  Skin Reviewed  Nails Reviewed   Diet Order:   Diet Order             Diet Carb Modified Fluid consistency: Thin; Room service appropriate? Yes  Diet effective now                  EDUCATION NEEDS:   No education needs have been identified at this time  Skin:  Skin Assessment: Reviewed RN Assessment  Last BM:  4/14- type 6  Height:   Ht Readings from Last 1 Encounters:  10/31/23 5\' 9"  (1.753 m)    Weight:   Wt Readings from Last 1 Encounters:  11/06/23 (!) 164.9 kg    Ideal Body Weight:  72.7 kg  BMI:  Body mass index is 53.68 kg/m.  Estimated Nutritional Needs:   Kcal:  3000-3300kcal/day  Protein:  >130g/day  Fluid:  2.2-2.5L/day  Torrance Freestone MS, RD, LDN If unable to be reached, please send secure chat to "RD inpatient" available from 8:00a-4:00p daily

## 2023-11-07 ENCOUNTER — Other Ambulatory Visit (HOSPITAL_COMMUNITY): Payer: Self-pay

## 2023-11-07 ENCOUNTER — Telehealth (HOSPITAL_COMMUNITY): Payer: Self-pay | Admitting: Pharmacy Technician

## 2023-11-07 DIAGNOSIS — J9622 Acute and chronic respiratory failure with hypercapnia: Secondary | ICD-10-CM | POA: Diagnosis not present

## 2023-11-07 DIAGNOSIS — J9621 Acute and chronic respiratory failure with hypoxia: Secondary | ICD-10-CM | POA: Diagnosis not present

## 2023-11-07 LAB — BASIC METABOLIC PANEL WITH GFR
Anion gap: 5 (ref 5–15)
BUN: 28 mg/dL — ABNORMAL HIGH (ref 6–20)
CO2: 27 mmol/L (ref 22–32)
Calcium: 9.1 mg/dL (ref 8.9–10.3)
Chloride: 105 mmol/L (ref 98–111)
Creatinine, Ser: 0.94 mg/dL (ref 0.61–1.24)
GFR, Estimated: >60 mL/min (ref >60)
Glucose, Bld: 218 mg/dL — ABNORMAL HIGH (ref 70–99)
Potassium: 4.1 mmol/L (ref 3.5–5.1)
Sodium: 137 mmol/L (ref 135–145)

## 2023-11-07 LAB — GLUCOSE, CAPILLARY
Glucose-Capillary: 93 mg/dL (ref 70–99)
Glucose-Capillary: 116 mg/dL — ABNORMAL HIGH (ref 70–99)
Glucose-Capillary: 189 mg/dL — ABNORMAL HIGH (ref 70–99)
Glucose-Capillary: 237 mg/dL — ABNORMAL HIGH (ref 70–99)
Glucose-Capillary: 150 mg/dL — ABNORMAL HIGH (ref 70–99)

## 2023-11-07 MED ORDER — PREDNISONE 20 MG PO TABS
30.0000 mg | ORAL_TABLET | Freq: Every day | ORAL | Status: AC
  Administered 2023-11-10 – 2023-11-11 (×2): 30 mg via ORAL
  Filled 2023-11-07 (×2): qty 1

## 2023-11-07 MED ORDER — PREDNISONE 20 MG PO TABS
20.0000 mg | ORAL_TABLET | Freq: Every day | ORAL | Status: AC
  Administered 2023-11-12 – 2023-11-13 (×2): 20 mg via ORAL
  Filled 2023-11-07 (×2): qty 1

## 2023-11-07 MED ORDER — PREDNISONE 10 MG PO TABS
10.0000 mg | ORAL_TABLET | Freq: Every day | ORAL | Status: AC
  Administered 2023-11-14 – 2023-11-15 (×2): 10 mg via ORAL
  Filled 2023-11-07 (×2): qty 1

## 2023-11-07 MED ORDER — PREDNISONE 20 MG PO TABS: 20.0000 mg | ORAL_TABLET | Freq: Every day | ORAL | Status: DC

## 2023-11-07 MED ORDER — MELATONIN 5 MG PO TABS
10.0000 mg | ORAL_TABLET | Freq: Every evening | ORAL | Status: DC | PRN
  Administered 2023-11-07 – 2023-11-14 (×4): 10 mg via ORAL
  Filled 2023-11-07 (×4): qty 2

## 2023-11-07 MED ORDER — INSULIN ASPART 100 UNIT/ML IJ SOLN: 0.0000 [IU] | Freq: Every day | INTRAMUSCULAR | Status: DC

## 2023-11-07 MED ORDER — PREDNISONE 20 MG PO TABS: 30.0000 mg | ORAL_TABLET | Freq: Every day | ORAL | Status: DC

## 2023-11-07 MED ORDER — PREDNISONE 20 MG PO TABS
40.0000 mg | ORAL_TABLET | Freq: Every day | ORAL | Status: AC
  Administered 2023-11-08 – 2023-11-09 (×2): 40 mg via ORAL
  Filled 2023-11-07 (×2): qty 2

## 2023-11-07 MED ORDER — PREDNISONE 20 MG PO TABS: 40.0000 mg | ORAL_TABLET | Freq: Every day | ORAL | Status: DC

## 2023-11-07 MED ORDER — INSULIN ASPART 100 UNIT/ML IJ SOLN
0.0000 [IU] | Freq: Three times a day (TID) | INTRAMUSCULAR | Status: AC
Start: 2023-11-07 — End: ?
  Administered 2023-11-07: 7 [IU] via SUBCUTANEOUS
  Administered 2023-11-08: 4 [IU] via SUBCUTANEOUS
  Administered 2023-11-08 – 2023-11-09 (×2): 7 [IU] via SUBCUTANEOUS
  Administered 2023-11-09: 3 [IU] via SUBCUTANEOUS
  Administered 2023-11-09 – 2023-11-10 (×3): 4 [IU] via SUBCUTANEOUS
  Administered 2023-11-10: 3 [IU] via SUBCUTANEOUS
  Administered 2023-11-11: 7 [IU] via SUBCUTANEOUS
  Administered 2023-11-11: 4 [IU] via SUBCUTANEOUS
  Administered 2023-11-11: 3 [IU] via SUBCUTANEOUS
  Administered 2023-11-12 (×2): 4 [IU] via SUBCUTANEOUS
  Administered 2023-11-12: 3 [IU] via SUBCUTANEOUS
  Administered 2023-11-13 (×2): 4 [IU] via SUBCUTANEOUS
  Administered 2023-11-13 – 2023-11-15 (×4): 3 [IU] via SUBCUTANEOUS
  Filled 2023-11-07 (×21): qty 1

## 2023-11-07 NOTE — TOC Initial Note (Signed)
 Transition of Care [facility]) - Initial/Assessment Note    Patient Details  Name: Clifford Nichols MRN: [id number] Date of Birth: 1981/06/04  Transition of Care [facility]) CM/SW Contact:    Garret Reddish, RN Phone Number: 11/07/2023, 4:11 PM  Clinical Narrative:                 Chart reviewed.  Noted that patient was admitted with acute on chronic respiratory failure with hypoxia and hypercapnia.  Patient is currently on 58% of high flow oxygen.    I have meet with patient and his spouse at bedside on yesterday.  Patient reports that prior to admission he was able to get around his home with a 2 wheeled rolling walker.  He was able to bath himself and needed assistance with getting his socks and shoes on.  He informed me that his PCP is through [facility] and he receives most of his medications through the clinic.  He also uses [facility].  He informs me that he has home 02 via [name] and he is on 4L per  at home.    He reports that he needs a CPAP.  He has not used CPAP since 2021.  He reports that he recently had a sleep study about 1 month ago.  He would also like a referral to the eye doctor as he has an eye that pops out at times.  He also would like to have something for sleep on discharge.  He and his wife have requested a BSC and shower chair for home use.    I have informed Mrs. Cammarano that PT has recommended home health PT and OT.  Patient reports that he did not have a home health preference.  I have asked Elnita Maxwell with [facility] to accept home health referral for home health PT, OT, and RN.    I have given Jon with [name] a referral for Bariatric [facility] and Bariatric shower chair.    TOC will continue to follow for discharge planning.    Expected Discharge Plan: Home w Home Health Services Barriers to Discharge: No Barriers Identified   Patient Goals and CMS Choice   CMS Medicare.gov Compare Post Acute Care list provided to:: Patient Choice offered to / list presented to : Patient       Expected Discharge Plan and Services   Discharge Planning Services: CM Consult Post Acute Care Choice: Home Health Living arrangements for the past 2 months: Single Family Home                 DME Arranged: 3-N-1, Shower stool (Bariatric 3 in 1 and Bariatric shower chair) DME Agency: [employer] Date DME Agency Contacted: 11/07/23   Representative spoke with at DME Agency: Cletis [place] HH Arranged: RN, OT, PT [facility] Agency: [name] Date [facility] Agency Contacted: 11/07/23   Representative spoke with at [facility] Agency: Elnita Maxwell  Prior Living Arrangements/Services Living arrangements for the past 2 months: Single Family Home Lives with:: Spouse Patient language and need for interpreter reviewed:: Yes Do you feel safe going back to the place where you live?: Yes      Need for Family Participation in Patient Care: Yes (Comment) Care giver support system in place?: Yes (comment) (Patient has a supportive spouse) Current home services:  (Patient has 2 wheeled rolling walker) Criminal Activity/Legal Involvement Pertinent to Current Situation/Hospitalization: No - Comment as needed  Activities of Daily Living   ADL Screening (condition at time of admission) Independently performs ADLs?: Yes (  appropriate for developmental age) Is the patient deaf or have difficulty hearing?: No Does the patient have difficulty seeing, even when wearing glasses/contacts?: No Does the patient have difficulty concentrating, remembering, or making decisions?: No  Permission Sought/Granted                  Emotional Assessment Appearance:: Appears stated age Attitude/Demeanor/Rapport: Engaged Affect (typically observed): Appropriate Orientation: : Oriented to Self, Oriented to Place, Oriented to  Time, Oriented to Situation      Admission diagnosis:  Cellulitis of chest wall [L03.313] Septic shock (HCC) [A41.9, R65.21] Acute respiratory failure with hypoxia and hypercapnia (HCC) [J96.01,  J96.02] Acute on chronic respiratory failure with hypoxia and hypercapnia (HCC) [J96.21, J96.22] Acute sepsis [facility]) [A41.9] Patient Active Problem List   Diagnosis Date Noted   Acute on chronic respiratory failure with hypoxia and hypercapnia (HCC) 10/31/2023   PCP:  Dionicia Frater, MD Pharmacy:   [facility] [phone] [place], [place] - [address] AT [facility] [address] [place] [address] [place] [address] Phone: [phone] Fax: [phone]  [facility] [address] [place] [address] Phone: [phone] Fax: [phone]     Social Drivers of Health (SDOH) Social History: SDOH Screenings   Food Insecurity: No Food Insecurity (10/31/2023)  Housing: Low Risk  (10/31/2023)  Transportation Needs: No Transportation Needs (10/31/2023)  Utilities: Not At Risk (10/31/2023)  Tobacco Use: Low Risk  (10/31/2023)   SDOH Interventions:     Readmission Risk Interventions     No data to display

## 2023-11-07 NOTE — Inpatient Diabetes Management (Signed)
 Inpatient Diabetes Program Recommendations  AACE/ADA: New Consensus Statement on Inpatient Glycemic Control (2015)  Target Ranges:  Prepandial:   less than 140 mg/dL      Peak postprandial:   less than 180 mg/dL (1-2 hours)      Critically ill patients:  140 - 180 mg/dL    Latest Reference Range & Units 11/06/23 23:51 11/07/23 03:40 11/07/23 07:22 11/07/23 11:12  Glucose-Capillary 70 - 99 mg/dL 88 93 952 (H) 841 (H)  (H): Data is abnormally high    Home DM Meds: Metformin 1000 mg bid   Current Orders: Semglee 20 units BID      Novolog Resistant Correction Scale/ SSI (0-20 units) Q4 hours      Novolog 12 units Q4 hours     MD- Note Tube Feeds have been stopped.  Now getting PO diet.  1. Please Stop the Novolog 12 units Q4 hours which was to cover the tube feeds  2. Change the Novolog SSI to TID AC + HS     --Will follow patient during hospitalization--  Langston Pippins RN, MSN, CDCES Diabetes Coordinator Inpatient Glycemic Control Team Team Pager: [phone] (8a-5p)

## 2023-11-07 NOTE — Progress Notes (Signed)
 Patient is not able to walk the distance required to go the bathroom, or he/she is unable to safely negotiate stairs required to access the bathroom.  A 3in1 BSC will alleviate this problem

## 2023-11-07 NOTE — Progress Notes (Signed)
 Progress Note   Patient: Clifford Nichols ZOX:[id number] DOB: 1981/03/08 DOA: 10/30/2023     7 DOS: the patient was seen and examined on 11/07/2023    Brief hospital course: 43 y.o with significant PMH of  severe OSA, chronic respiraotry failure on 4L, extreme obesity with Alveolar hypoventilation, DM and HTN who presented to the ED with chief complaints of progressive shortness of breath.   Per ED reports, EMS was called for worsening respiratory distress. On EMS arrival, patient was found with oxygen saturation in the 80's on his normal 4L. He was placed on NRB and then transitioned to CPAP. He was treated with Duonebs and IV solumedrol enroute, shortly after he became unresponsive.   ED Course: Initial vital signs showed HR of 96 beats/minute, BP 136/75mm Hg, the RR 28breaths/minute, and the oxygen saturation 83% on CPAP and a temperature of 101.91F (38.4C). Patient was lethargic, moaned intermittently, and responded with one-word answers; symmetric movement in the arms and legs was observed. Due to worsening mental status on CPAP he was intubated for airway protection.  Patient was subsequently extubated and transferred to [facility] service on 11/06/2023. Other hospital course as noted below     Assessment and Plan:   Severe ACUTE on chronic hypoxic and Hypercapnic Respiratory Failure Likely multifactorial in the setting of obesity hypoventilation syndrome as well as severe bilateral pneumonia CTA chest negative for PE but consistent with an infectious process. Patient extubated on 11/05/2023 Oxygen requirement continues to improve currently on 58% of high flow According to patient he uses 4 L of oxygen at home Continue to wean down oxygen as tolerated Patient has completed antibiotic course Hopefully transition from IV Solu-Medrol to oral prednisone taper tomorrow     Sepsis with septic shock due to suspected pneumonia STAPH HOMINIS BACTEREMIA Has completed antibiotic course Monitor  respiratory function as above   Mildly Elevated Troponin, suspect demand ischemia PMHx: HLD, HTN -Trend HS Troponin until peaked -Hold amlodipine and Lisinopril Follow-up on echocardiogram    Hypokalemia-continue to replete and monitor closely   ACUTE SYSTOLIC CARDIAC FAILURE- EF 30% Cardiomyopathy and PULM HTN Lasix as tolerated Monitor daily weight Monitor input and output GDMT as blood pressure allows   ACUTE KIDNEY INJURY/Renal Failure-improved Monitor renal function closely Avoid nephrotoxic agents     Diabetes mellitus type 2 Monitor glucose closely Continue insulin therapy at this time   Morbid obesity with BMI 53.7 Counseled on weight loss with diet and exercise when medically stable   Family communication.  Discussed with patient's wife at bedside   Subjective:  Patient seen and examined at bedside this morning Sujai requirement has improved to 58% FiO2 with a flow rate of 55 L Admits to improvement in respiratory function Denies chest pain nausea vomiting or abdominal pain   Physical Exam: GENERAL:critically ill appearing, on high flow nasal oxygen EYES: Pupils equal, round, reactive to light.  No scleral icterus.  MOUTH: Moist mucosal membrane.  NECK: Supple.  PULMONARY: Lungs clear to auscultation, CARDIOVASCULAR: S1 and S2.  Regular rate and rhythm GASTROINTESTINAL: Soft, nontender, -distended. Positive bowel sounds.  MUSCULOSKELETAL: No swelling, clubbing, or edema.  NEUROLOGIC: Alert and oriented x 3 SKIN:normal, warm to touch, Capillary refill delayed  Pulses present bilaterally   Disposition: Pending improvement in respiratory function as patient is currently on high flow nasal OXYGENATION     Data Reviewed: Chest x-ray reviewed    Latest Ref Rng & Units 11/07/2023   12:15 PM 11/06/2023    4:06 AM 11/05/2023  4:47 AM  BMP  Glucose 70 - 99 mg/dL 161  096  95   BUN 6 - 20 mg/dL 28  39  37   Creatinine 0.61 - 1.24 mg/dL 0.45  4.09  8.11    Sodium 135 - 145 mmol/L 137  140  143   Potassium 3.5 - 5.1 mmol/L 4.1  3.3  3.9   Chloride 98 - 111 mmol/L 105  105  108   CO2 22 - 32 mmol/L 27  25  26    Calcium 8.9 - 10.3 mg/dL 9.1  8.9  9.4     Vitals:   11/07/23 1000 11/07/23 1100 11/07/23 1200 11/07/23 1406  BP: 135/74 128/73 (!) 142/83   Pulse: (!) 104 100 (!) 105   Resp: (!) 21 (!) 22 (!) 34   Temp:      TempSrc:      SpO2: 93% 93% 92% 96%  Weight:      Height:          Latest Ref Rng & Units 11/06/2023    4:06 AM 11/05/2023    4:47 AM 11/04/2023    6:59 AM  CBC  WBC 4.0 - 10.5 K/uL 11.5  11.5  11.0   Hemoglobin 13.0 - 17.0 g/dL 91.4  78.2  95.6   Hematocrit 39.0 - 52.0 % 47.4  51.0  48.2   Platelets 150 - 400 K/uL 168  152  131      Author: Ezzard Holms, MD 11/07/2023 3:07 PM  For on call review [email or web address].

## 2023-11-07 NOTE — Telephone Encounter (Signed)
 Patient Product/process development scientist completed.    The patient is insured through [employer]. Patient has Medicare and is not eligible for a copay card, but may be able to apply for patient assistance or Medicare RX Payment Plan (Patient Must reach out to their plan, if eligible for payment plan), if available.    Ran test claim for Entresto 24-26 mg and the current 30 day co-pay is $4.80.  Ran test claim for Farxiga 10 mg and the current 30 day co-pay is $4.80.  Ran test claim for Jardiance 10 mg and the current 30 day co-pay is $4.80.  This test claim was processed through [facility]- copay amounts may vary at other pharmacies due to pharmacy/plan contracts, or as the patient moves through the different stages of their insurance plan.     Roland Earl, CPHT Pharmacy Technician III Certified Patient Advocate [facility] Patient Advocate Team Direct Number: [phone]  Fax: [phone]

## 2023-11-07 NOTE — Progress Notes (Signed)
 Heart Failure Stewardship Pharmacy Note  PCP: Dionicia Frater, MD PCP-Cardiologist: None  HPI: Clifford Nichols is a 43 y.o. male with history of severe OSA, chronic respiraotry failure on 4L, extreme obesity with Alveolar hypoventilation, COVID ARDS in 2021, DM, OSA, and HTN who presented with shortness of breath. Found to be hypoxic on home 4L of O2, transitioned to CPAP, then eventually intubated after becoming unresponsive. Patient was febrile on admission and blood cultures grew staphylococcus hominis and epidermidis, though possibly a contaminant. On admission, BNP was 243.7 (falsely low due to morbid obesity), HS-troponin was 95, lactic acid was 1.3 > 1.8, A1c was 6.8, and PCT was 0.27. Chest x-ray noted low lung volumes with mild right basilar atelectasis and/or infiltrate. CT did not note PE, however did show marked severity areas of bilateral lower lobe scarring, atelectasis, or infiltrate. Most recent CXR noted low volume under penetrated film with vascular congestion and probable retrocardiac collapse/consolidation.   Pertinent Cardiac History: Echocardiogram 11/2019 with LVEF 55-60%. Echocardiogram this admission noted LVEF of 35-40%, moderately reduced RV function.  Pertinent Lab Values: Creatinine, Ser  Date Value Ref Range Status  11/06/2023 1.17 0.61 - 1.24 mg/dL Final   BUN  Date Value Ref Range Status  11/06/2023 39 (H) 6 - 20 mg/dL Final   Potassium  Date Value Ref Range Status  11/06/2023 3.3 (L) 3.5 - 5.1 mmol/L Final   Sodium  Date Value Ref Range Status  11/06/2023 140 135 - 145 mmol/L Final   B Natriuretic Peptide  Date Value Ref Range Status  10/31/2023 129.1 (H) 0.0 - 100.0 pg/mL Final    Comment:    Performed at [facility], [address] [place], [place] [address]   Magnesium  Date Value Ref Range Status  11/06/2023 2.2 1.7 - 2.4 mg/dL Final    Comment:    Performed at [facility], [address] [place], [place] [address]    Hgb A1c MFr Bld  Date Value Ref Range Status  10/31/2023 6.8 (H) 4.8 - 5.6 % Final    Comment:    (NOTE)         Prediabetes: 5.7 - 6.4         Diabetes: >6.4         Glycemic control for adults with diabetes: <7.0    TSH  Date Value Ref Range Status  11/03/2023 0.954 0.350 - 4.500 uIU/mL Final    Comment:    Performed by a 3rd Generation assay with a functional sensitivity of <=0.01 uIU/mL. Performed at [facility], [address] [place], [place] [address]     Vital Signs: Admission weight:414.91 lbs Temp:  [98.2 F (36.8 C)-99.3 F (37.4 C)] 98.2 F (36.8 C) (04/15 0400) Pulse Rate:  [89-117] 100 (04/15 0600) Cardiac Rhythm: Normal sinus rhythm (04/15 0821) Resp:  [18-35] 31 (04/15 0600) BP: (103-133)/(54-87) 103/62 (04/15 0600) SpO2:  [86 %-95 %] 93 % (04/15 0600) FiO2 (%):  [50 %-65 %] 60 % (04/15 0814) Weight:  [169 kg (372 lb 9.6 oz)] 169 kg (372 lb 9.6 oz) (04/15 0500)  Intake/Output Summary (Last 24 hours) at 11/07/2023 0930 Last data filed at 11/07/2023 0600 Gross per 24 hour  Intake 758.63 ml  Output 400 ml  Net 358.63 ml   Current Heart Failure Medications:  Loop diuretic: none Beta-Blocker: none ACEI/ARB/ARNI: none MRA: none SGLT2i: none Other: none  Prior to admission Heart Failure Medications:  Loop diuretic: none Beta-Blocker: none ACEI/ARB/ARNI: lisinopril 20 mg daily  MRA: none SGLT2i: none Other: amlodipine 10 mg daily  Assessment: 1. 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 NYHA class IV symptoms.  -Symptoms: Reports feeling fine at rest with improved shortness of breath, however, he remains on 55L HFNC. He reports fatigue from inability to. -Volume: Volume status difficult to assess due to body habitus. Weight is highly variable, doubt accuracy. Has received 2 days total of Lasix and 3 days of acetazolamide. Would check BMET. -Hemodynamics: BP is  improved. HR 90s. -VH:QIONG consider a beta-1 selective agent closer to discharge. -ACEI/ARB/ARNI: Agree with holding PTA ACEi for now. -MRA: Consider starting spironolactone today. Hypokalemic on last BMET. -SGLT2i: Would consider adding today given no effect on BP. Patient is mobile at baseline and not at higher risk of UTI. Currently on insulin with relatively controlled BG - insulin may require adjustment. -Consider swapping Trulicity to [facility] outpatient for OSA indication and greater weight loss.  Plan: 1) Medication changes recommended at this time: -Consider starting spironolactone 12.5 mg daily  -Consider starting Farxiga 10 mg daily  2) Patient assistance: -Copay for Entresto, Farxiga, Jardiance is $4.80  3) Education: - Patient has been educated on current HF medications and potential additions to HF medication regimen - Patient verbalizes understanding that over the next few months, these medication doses may change and more medications may be added to optimize HF regimen - Patient has been educated on basic disease state pathophysiology and goals of therapy  Medication Assistance / Insurance Benefits Check: Does the patient have prescription insurance?    Type of insurance plan:  Does the patient qualify for medication assistance through manufacturers or grants? No   Outpatient Pharmacy: Prior to admission outpatient pharmacy: [facility] Is the patient agreeable to switch to [facility] Outpatient Pharmacy?: Yes Is the patient willing to utilize a [facility] [place] [facility] pharmacy at discharge?: Yes  Please do not hesitate to reach out with questions or concerns,  Bevely Brush, PharmD, CPP, BCPS Heart Failure Pharmacist  Phone - [phone] 11/07/2023 9:30 AM

## 2023-11-08 DIAGNOSIS — J9622 Acute and chronic respiratory failure with hypercapnia: Secondary | ICD-10-CM | POA: Diagnosis not present

## 2023-11-08 DIAGNOSIS — J9621 Acute and chronic respiratory failure with hypoxia: Secondary | ICD-10-CM | POA: Diagnosis not present

## 2023-11-08 LAB — CBC WITH DIFFERENTIAL/PLATELET
Abs Immature Granulocytes: 0.08 10*3/uL — ABNORMAL HIGH (ref 0.00–0.07)
Basophils Absolute: 0 10*3/uL (ref 0.0–0.1)
Basophils Relative: 0 %
Eosinophils Absolute: 0.1 10*3/uL (ref 0.0–0.5)
Eosinophils Relative: 1 %
HCT: 46.9 % (ref 39.0–52.0)
Hemoglobin: 13.1 g/dL (ref 13.0–17.0)
Immature Granulocytes: 1 %
Lymphocytes Relative: 12 %
Lymphs Abs: 1.4 10*3/uL (ref 0.7–4.0)
MCH: 26.1 pg (ref 26.0–34.0)
MCHC: 27.9 g/dL — ABNORMAL LOW (ref 30.0–36.0)
MCV: 93.6 fL (ref 80.0–100.0)
Monocytes Absolute: 0.9 10*3/uL (ref 0.1–1.0)
Monocytes Relative: 8 %
Neutro Abs: 9 10*3/uL — ABNORMAL HIGH (ref 1.7–7.7)
Neutrophils Relative %: 78 %
Platelets: 187 10*3/uL (ref 150–400)
RBC: 5.01 MIL/uL (ref 4.22–5.81)
RDW: 15.3 % (ref 11.5–15.5)
WBC: 11.5 10*3/uL — ABNORMAL HIGH (ref 4.0–10.5)
nRBC: 0 % (ref 0.0–0.2)

## 2023-11-08 LAB — BASIC METABOLIC PANEL WITH GFR
Anion gap: 5 (ref 5–15)
BUN: 25 mg/dL — ABNORMAL HIGH (ref 6–20)
CO2: 29 mmol/L (ref 22–32)
Calcium: 9.1 mg/dL (ref 8.9–10.3)
Chloride: 106 mmol/L (ref 98–111)
Creatinine, Ser: 0.82 mg/dL (ref 0.61–1.24)
GFR, Estimated: >60 mL/min (ref >60)
Glucose, Bld: 135 mg/dL — ABNORMAL HIGH (ref 70–99)
Potassium: 4.1 mmol/L (ref 3.5–5.1)
Sodium: 140 mmol/L (ref 135–145)

## 2023-11-08 LAB — GLUCOSE, CAPILLARY
Glucose-Capillary: 111 mg/dL — ABNORMAL HIGH (ref 70–99)
Glucose-Capillary: 163 mg/dL — ABNORMAL HIGH (ref 70–99)
Glucose-Capillary: 203 mg/dL — ABNORMAL HIGH (ref 70–99)
Glucose-Capillary: 134 mg/dL — ABNORMAL HIGH (ref 70–99)

## 2023-11-08 MED ORDER — IPRATROPIUM-ALBUTEROL 0.5-2.5 (3) MG/3ML IN SOLN
3.0000 mL | Freq: Three times a day (TID) | RESPIRATORY_TRACT | Status: DC
  Administered 2023-11-08 – 2023-11-15 (×20): 3 mL via RESPIRATORY_TRACT
  Filled 2023-11-08 (×21): qty 3

## 2023-11-08 MED ORDER — DAPAGLIFLOZIN PROPANEDIOL 10 MG PO TABS
10.0000 mg | ORAL_TABLET | Freq: Every day | ORAL | Status: DC
  Administered 2023-11-08 – 2023-11-15 (×8): 10 mg via ORAL
  Filled 2023-11-08 (×8): qty 1

## 2023-11-08 MED ORDER — SODIUM CHLORIDE 0.9% FLUSH
3.0000 mL | Freq: Two times a day (BID) | INTRAVENOUS | Status: DC
  Administered 2023-11-08 – 2023-11-15 (×13): 3 mL via INTRAVENOUS

## 2023-11-08 MED ORDER — SPIRONOLACTONE 12.5 MG HALF TABLET
12.5000 mg | ORAL_TABLET | Freq: Every day | ORAL | Status: DC
  Administered 2023-11-08 – 2023-11-13 (×6): 12.5 mg via ORAL
  Filled 2023-11-08 (×6): qty 1

## 2023-11-08 NOTE — Progress Notes (Signed)
 Heart Failure Nurse Navigator Progress Note  PCP: Dionicia Frater, MD PCP-Cardiologist: Belva Boyden, MD Admission Diagnosis: Acute sepsis (HCC) Septic shock (HCC) Cellulitis of chest wall  Acute respiratory failure with hypoxia and hypercapnia (HCC)  Admitted from: Home via EMS  Presentation:   Clifford Nichols presented with respiratory distress to ED. Per EMS, patients oxygen saturation was in the low 80's on his normal 4L, placed on nonrebreather by the FD and then to CPAP and became unresponsive en route. He was intubated. BNP 243.7( falsely low due to morbid obesity).  HS-troponin was 95.  Chest x-ray low lund volumes with mild basilar atelectasis or infiltrate.   ECHO/ LVEF: 35-40%-moderately reduced RV function  Clinical Course:  Past Medical History:  Diagnosis Date   Obesity    Obesity hypoventilation syndrome (HCC)    OSA (obstructive sleep apnea)      Social History   Socioeconomic History   Marital status: Married    Spouse name: Thomas Fleischer   Number of children: 3   Years of education: Not on file   Highest education level: 12th grade  Occupational History   Occupation: Disability  Tobacco Use   Smoking status: Never    Passive exposure: Never   Smokeless tobacco: Never  Vaping Use   Vaping status: Never Used  Substance and Sexual Activity   Alcohol use: Never   Drug use: Never   Sexual activity: Not on file  Other Topics Concern   Not on file  Social History Narrative   Not on file   Social Drivers of Health   Financial Resource Strain: High Risk (11/08/2023)   Overall Financial Resource Strain (CARDIA)    Difficulty of Paying Living Expenses: Hard  Food Insecurity: No Food Insecurity (11/08/2023)   Hunger Vital Sign    Worried About Running Out of Food in the Last Year: Never true    Ran Out of Food in the Last Year: Never true  Transportation Needs: Unmet Transportation Needs (11/08/2023)   PRAPARE - Administrator, Civil Service  (Medical): Yes    Lack of Transportation (Non-Medical): Yes  Physical Activity: Not on file  Stress: Not on file  Social Connections: Not on file   Education Assessment and Provision:  Detailed education and instructions provided on heart failure disease management including the following:  Signs and symptoms of Heart Failure When to call the physician Importance of daily weights Low sodium diet Fluid restriction Medication management Anticipated future follow-up appointments  Patient education given on each of the above topics.  Patient acknowledges understanding via teach back method and acceptance of all instructions.  Education Materials:  "Living Better With Heart Failure" Booklet, HF zone tool, & Daily Weight Tracker Tool.  Patient has scale at home: No-provided him with a bariatric scale for discharge since he was unable to obtain one. Patient has pill box at home: Yes    High Risk Criteria for Readmission and/or Poor Patient Outcomes: Heart failure hospital admissions (last 6 months): 0  No Show rate: 0 Difficult social situation: None Demonstrates medication adherence: Yes Primary Language: English Literacy level: Reading, Writing and Comprehension  Barriers of Care:   Transportation can be a concern at times depending on his  wife's schedule since he does not drive.  Considerations/Referrals:   Referral made to Heart Failure Pharmacist Stewardship: Yes Referral made to Heart Failure CSW/NCM TOC: Yes for Food, Transportation and Financial Needs. Referral made to Heart & Vascular TOC clinic: Yes-TOC scheduled  Items  for Follow-up on DC/TOC: Diet & Fluid Restrictions Daily Weights New HF Medications Continued Heart Failure Education  Celedonio Coil, RN, BSN [facility] Heart Failure Navigator Secure Chat Only

## 2023-11-08 NOTE — Progress Notes (Signed)
 Heart Failure Stewardship Pharmacy Note  PCP: Dionicia Frater, MD PCP-Cardiologist: None  HPI: Clifford Nichols is a 43 y.o. male with history of severe OSA, chronic respiraotry failure on 4L, extreme obesity with Alveolar hypoventilation, COVID ARDS in 2021, DM, OSA, and HTN who presented with shortness of breath. Found to be hypoxic on home 4L of O2, transitioned to CPAP, then eventually intubated after becoming unresponsive. Patient was febrile on admission and blood cultures grew staphylococcus hominis and epidermidis, though possibly a contaminant. On admission, BNP was 243.7 (falsely low due to morbid obesity), HS-troponin was 95, lactic acid was 1.3 > 1.8, A1c was 6.8, and PCT was 0.27. Chest x-ray noted low lung volumes with mild right basilar atelectasis and/or infiltrate. CT did not note PE, however did show marked severity areas of bilateral lower lobe scarring, atelectasis, or infiltrate. Most recent CXR noted low volume under penetrated film with vascular congestion and probable retrocardiac collapse/consolidation.   Pertinent Cardiac History: Echocardiogram 11/2019 with LVEF 55-60%. Echocardiogram this admission noted LVEF of 35-40%, moderately reduced RV function.  Pertinent Lab Values: Creatinine, Ser  Date Value Ref Range Status  11/08/2023 0.82 0.61 - 1.24 mg/dL Final   BUN  Date Value Ref Range Status  11/08/2023 25 (H) 6 - 20 mg/dL Final   Potassium  Date Value Ref Range Status  11/08/2023 4.1 3.5 - 5.1 mmol/L Final   Sodium  Date Value Ref Range Status  11/08/2023 140 135 - 145 mmol/L Final   B Natriuretic Peptide  Date Value Ref Range Status  10/31/2023 129.1 (H) 0.0 - 100.0 pg/mL Final    Comment:    Performed at [facility], [address] [place], [place] [address]   Magnesium  Date Value Ref Range Status  11/06/2023 2.2 1.7 - 2.4 mg/dL Final    Comment:    Performed at [facility], [address] [place], [place] [address]    Hgb A1c MFr Bld  Date Value Ref Range Status  10/31/2023 6.8 (H) 4.8 - 5.6 % Final    Comment:    (NOTE)         Prediabetes: 5.7 - 6.4         Diabetes: >6.4         Glycemic control for adults with diabetes: <7.0    TSH  Date Value Ref Range Status  11/03/2023 0.954 0.350 - 4.500 uIU/mL Final    Comment:    Performed by a 3rd Generation assay with a functional sensitivity of <=0.01 uIU/mL. Performed at [facility], [address] [place], [place] [address]     Vital Signs: Admission weight:414.91 lbs Temp:  [98.1 F (36.7 C)-98.8 F (37.1 C)] 98.1 F (36.7 C) (04/16 0000) Pulse Rate:  [75-103] 98 (04/16 1000) Cardiac Rhythm: Normal sinus rhythm;Sinus tachycardia (04/16 0800) Resp:  [15-34] 15 (04/16 0900) BP: (105-161)/(52-143) 158/143 (04/16 1000) SpO2:  [84 %-100 %] 93 % (04/16 1000) FiO2 (%):  [40 %-65 %] 65 % (04/16 0800) Weight:  [165 kg (363 lb 12.1 oz)] 165 kg (363 lb 12.1 oz) (04/16 0500)  Intake/Output Summary (Last 24 hours) at 11/08/2023 1254 Last data filed at 11/08/2023 1028 Gross per 24 hour  Intake 480 ml  Output 1250 ml  Net -770 ml   Current Heart Failure Medications:  Loop diuretic: none Beta-Blocker: none ACEI/ARB/ARNI: none MRA: none SGLT2i: none Other: none  Prior to admission Heart Failure Medications:  Loop diuretic: none Beta-Blocker: none ACEI/ARB/ARNI: lisinopril 20 mg daily  MRA: none SGLT2i: none Other: amlodipine 10 mg daily  Assessment: 1. 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 NYHA class IV symptoms.  -Symptoms: Reports feeling fine at rest with improved shortness of breath, however, he remains on 55L HFNC. He reports fatigue and appetite have improved. Patient reports feeling like he has additional fluid in his belly. -Volume: Volume status difficult to assess due to body habitus. Weight is highly variable, doubt accuracy. Has received 2  days total of Lasix and 3 days of acetazolamide. Would consider resuming diuretics. -Hemodynamics: BP is improved. HR 90s. -WU:JWJXB consider a beta-1 selective agent closer to discharge. -ACEI/ARB/ARNI: Agree with holding PTA ACEi for now. -MRA: Consider starting spironolactone today. This will also provide additional diuresis. -SGLT2i: Would consider adding today given no effect on BP. Patient is mobile at baseline and not at high risk of UTI. Currently on insulin with relatively controlled BG > insulin may require adjustment. -Consider swapping Trulicity to [facility] outpatient for OSA indication and greater weight loss.  Plan: 1) Medication changes recommended at this time: -Consider starting spironolactone 12.5 mg daily  -Consider starting Farxiga 10 mg daily  2) Patient assistance: -Copay for Entresto, Farxiga, Jardiance is $4.80  3) Education: - Patient has been educated on current HF medications and potential additions to HF medication regimen - Patient verbalizes understanding that over the next few months, these medication doses may change and more medications may be added to optimize HF regimen - Patient has been educated on basic disease state pathophysiology and goals of therapy  Medication Assistance / Insurance Benefits Check: Does the patient have prescription insurance?    Type of insurance plan:  Does the patient qualify for medication assistance through manufacturers or grants? No   Outpatient Pharmacy: Prior to admission outpatient pharmacy: [facility] Is the patient agreeable to switch to [facility] Outpatient Pharmacy?: Yes Is the patient willing to utilize a [facility] pharmacy at discharge?: Yes  Please do not hesitate to reach out with questions or concerns,  Bevely Brush, PharmD, CPP, BCPS Heart Failure Pharmacist  Phone - [phone] 11/08/2023 12:54 PM

## 2023-11-08 NOTE — Progress Notes (Signed)
 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 Now extubated    PT Comments  Patient is agreeable to PT session. He is in the bed with family member at the bedside. Increased independence with bed mobility and transfers this session with CGA required. Pre-gait activity performed with standing tolerance of around 2 minutes. Walking efforts are limited by ICU lines. Recommend to continue PT to maximize independence.    If plan is discharge home, recommend the following: A little help with walking and/or transfers;A little help with bathing/dressing/bathroom;Assist for transportation;Help with stairs or ramp for entrance   Can travel by private vehicle        Equipment Recommendations  None recommended by PT    Recommendations for Other Services       Precautions / Restrictions Precautions Precautions: Fall Recall of Precautions/Restrictions: Intact Precaution/Restrictions Comments: monitor O2 Restrictions Weight Bearing Restrictions Per Provider Order: No     Mobility  Bed Mobility Overal bed mobility: Needs Assistance Bed Mobility: Supine to Sit     Supine to sit: Contact guard     General bed mobility comments: increased time, no physical assistance required    Transfers Overall transfer level: Needs assistance Equipment used: Rolling walker (2 wheels) Transfers: Bed to chair/wheelchair/BSC     Step pivot transfers: Contact guard assist       General transfer comment: good safety awareness with hand placement    Ambulation/Gait             Pre-gait activities: weight shifting to left and right in  preparation for ambulation. standing tolerance of around 2 minutes today. dyspnea with exertion General Gait Details: unable to attempt due to 02 tubing.   Stairs             Wheelchair Mobility     Tilt Bed    Modified Rankin (Stroke Patients Only)       Balance Overall balance assessment: Needs assistance Sitting-balance support: Feet supported Sitting balance-Leahy Scale: Fair     Standing balance support: Bilateral upper extremity supported Standing balance-Leahy Scale: Fair Standing balance comment: using rolling walker for support                            Communication Communication Communication: No apparent difficulties  Cognition Arousal: Alert Behavior During Therapy: WFL for tasks assessed/performed   PT - Cognitive impairments: No apparent impairments                         Following commands: Intact      Cueing Cueing Techniques: Verbal cues  Exercises      General Comments General comments (skin integrity, edema, etc.): Sp02 in the low 90's.      Pertinent Vitals/Pain Pain Assessment Pain Assessment: No/denies pain    Home Living                          Prior Function            PT Goals (current goals can now be found in the care plan section) Acute Rehab PT  Goals Patient Stated Goal: to go home PT Goal Formulation: With patient/family Time For Goal Achievement: 11/19/23 Potential to Achieve Goals: Fair Progress towards PT goals: Progressing toward goals    Frequency    Min 2X/week      PT Plan      Co-evaluation              AM-PAC PT "6 Clicks" Mobility   Outcome Measure  Help needed turning from your back to your side while in a flat bed without using bedrails?: A Little Help needed moving from lying on your back to sitting on the side of a flat bed without using bedrails?: A Little Help needed moving to and from a bed to a chair (including a wheelchair)?: A Little Help  needed standing up from a chair using your arms (e.g., wheelchair or bedside chair)?: A Little Help needed to walk in hospital room?: A Little Help needed climbing 3-5 steps with a railing? : Total 6 Click Score: 16    End of Session         PT Visit Diagnosis: Muscle weakness (generalized) (M62.81);Difficulty in walking, not elsewhere classified (R26.2)     Time: 4132-4401 PT Time Calculation (min) (ACUTE ONLY): 12 min  Charges:    $Therapeutic Activity: 8-22 mins PT General Charges $$ ACUTE PT VISIT: 1 Visit                    Ozie Bo, PT, MPT   Erlene Hawks 11/08/2023, 11:05 AM

## 2023-11-08 NOTE — TOC Progression Note (Signed)
 Transition of Care [facility]) - Progression Note    Patient Details  Name: Clifford Nichols MRN: [id number] Date of Birth: 15-Jul-1981  Transition of [facility] CM/SW Contact  Aryella Besecker A Gabriella Guile, RN Phone Number: 11/08/2023, 12:37 PM  Clinical Narrative:    I have spoken with [name] for [name].  He informs me that he has ordered Bariatric [facility] and Bariatric shower chair.  He informs me that both items where approved by insurance.  He informs me that [name] can deliver the DME one day prior to discharge.    TOC will continue to follow for discharge planning.     Expected Discharge Plan: Home w Home Health Services Barriers to Discharge: No Barriers Identified  Expected Discharge Plan and Services   Discharge Planning Services: CM Consult Post Acute Care Choice: Home Health Living arrangements for the past 2 months: Single Family Home                 DME Arranged: 3-N-1, Shower stool (Bariatric 3 in 1 and Bariatric shower chair) DME Agency: [employer] Date DME Agency Contacted: 11/07/23   Representative spoke with at DME Agency: [name] HH Arranged: RN, OT, PT [place] Agency: [name] Date [facility] Agency Contacted: 11/07/23   Representative spoke with at [facility] Agency: [name]   Social Determinants of Health (SDOH) Interventions SDOH Screenings   Food Insecurity: Food Insecurity Present (11/08/2023)  Housing: Low Risk  (11/08/2023)  Transportation Needs: Unmet Transportation Needs (11/08/2023)  Utilities: Not At Risk (10/31/2023)  Financial Resource Strain: High Risk (11/08/2023)  Tobacco Use: Low Risk  (11/08/2023)    Readmission Risk Interventions     No data to display

## 2023-11-08 NOTE — Progress Notes (Signed)
 Progress Note   Patient: Clifford Nichols WUJ:[id number] DOB: [birth year] DOA: 10/30/2023     8 DOS: the patient was seen and examined on 11/08/2023     Brief hospital course: 43 y.o with significant PMH of  severe OSA, chronic respiraotry failure on 4L, extreme obesity with Alveolar hypoventilation, DM and HTN who presented to the ED with chief complaints of progressive shortness of breath.   Per ED reports, EMS was called for worsening respiratory distress. On EMS arrival, patient was found with oxygen saturation in the 80's on his normal 4L. He was placed on NRB and then transitioned to CPAP. He was treated with Duonebs and IV solumedrol enroute, shortly after he became unresponsive.   ED Course: Initial vital signs showed HR of 96 beats/minute, BP 136/62mm Hg, the RR 28breaths/minute, and the oxygen saturation 83% on CPAP and a temperature of 101.20F (38.4C). Patient was lethargic, moaned intermittently, and responded with one-word answers; symmetric movement in the arms and legs was observed. Due to worsening mental status on CPAP he was intubated for airway protection.  Patient was subsequently extubated and transferred to TRH service on 11/06/2023. Other hospital course as noted below     Assessment and Plan:   Severe ACUTE on chronic hypoxic and Hypercapnic Respiratory Failure Likely multifactorial in the setting of obesity hypoventilation syndrome as well as severe bilateral pneumonia CTA chest negative for PE but consistent with an infectious process. Patient extubated on 11/05/2023 Currently on FiO2 of 68% sitting in a chair on high flow nasal oxygen According to patient he uses 4 L of oxygen at home Continue to wean down oxygen as tolerated Patient has completed antibiotic course Continue prednisone taper     Sepsis with septic shock due to suspected pneumonia STAPH HOMINIS BACTEREMIA Has completed antibiotic course Monitor respiratory function as above   Mildly Elevated  Troponin, suspect demand ischemia PMHx: HLD, HTN -Trend HS Troponin until peaked -Hold amlodipine and Lisinopril    Hypokalemia-continue to replete and monitor closely   ACUTE SYSTOLIC CARDIAC FAILURE- EF 35% Cardiomyopathy and PULM HTN Lasix as tolerated Monitor daily weight Monitor input and output GDMT as blood pressure allows Adding Farxiga as well as spironolactone today   ACUTE KIDNEY INJURY/Renal Failure-improved Monitor renal function closely Avoid nephrotoxic agents     Diabetes mellitus type 2 Monitor glucose closely Continue insulin therapy at this time   Morbid obesity with BMI 53.7 Counseled on weight loss with diet and exercise when medically stable   Family communication.  Discussed with patient's mom at bedside   Subjective:  Patient seen and examined at bedside this morning Sujai requirement has improved to 58% FiO2 with a flow rate of 55 L Admits to improvement in respiratory function Denies chest pain nausea vomiting or abdominal pain   Physical Exam: GENERAL:critically ill appearing, on high flow nasal oxygen EYES: Pupils equal, round, reactive to light.  No scleral icterus.  MOUTH: Moist mucosal membrane.  NECK: Supple.  PULMONARY: Lungs clear to auscultation, CARDIOVASCULAR: S1 and S2.  Regular rate and rhythm GASTROINTESTINAL: Soft, nontender, -distended. Positive bowel sounds.  MUSCULOSKELETAL: No swelling, clubbing, or edema.  NEUROLOGIC: Alert and oriented x 3 SKIN:normal, warm to touch, Capillary refill delayed  Pulses present bilaterally   Disposition: Pending improvement in respiratory function as patient is currently on high flow nasal OXYGENATION       Data Reviewed: I have reviewed patient's chest x-ray showing low volumes as well as findings of some congestion    Latest Ref  Rng & Units 11/08/2023    4:09 AM 11/06/2023    4:06 AM 11/05/2023    4:47 AM  CBC  WBC 4.0 - 10.5 K/uL 11.5  11.5  11.5   Hemoglobin 13.0 - 17.0 g/dL  09.8  11.9  14.7   Hematocrit 39.0 - 52.0 % 46.9  47.4  51.0   Platelets 150 - 400 K/uL 187  168  152        Latest Ref Rng & Units 11/08/2023    4:09 AM 11/07/2023   12:15 PM 11/06/2023    4:06 AM  BMP  Glucose 70 - 99 mg/dL 829  562  130   BUN 6 - 20 mg/dL 25  28  39   Creatinine 0.61 - 1.24 mg/dL 8.65  7.84  6.96   Sodium 135 - 145 mmol/L 140  137  140   Potassium 3.5 - 5.1 mmol/L 4.1  4.1  3.3   Chloride 98 - 111 mmol/L 106  105  105   CO2 22 - 32 mmol/L 29  27  25    Calcium 8.9 - 10.3 mg/dL 9.1  9.1  8.9     Vitals:   11/08/23 0700 11/08/23 0800 11/08/23 0900 11/08/23 1000  BP: (!) 148/70 (!) 143/57 (!) 161/110 (!) 158/143  Pulse: (!) 101 97 100 98  Resp: (!) 25 (!) 31 15   Temp:      TempSrc:  Oral    SpO2: 93% 100% (!) 84% 93%  Weight:      Height:         Time spent: 55 minutes  Author: Ezzard Holms, MD 11/08/2023 1:04 PM  For on call review [email or web address].

## 2023-11-09 DIAGNOSIS — J9622 Acute and chronic respiratory failure with hypercapnia: Secondary | ICD-10-CM | POA: Diagnosis not present

## 2023-11-09 DIAGNOSIS — J9621 Acute and chronic respiratory failure with hypoxia: Secondary | ICD-10-CM | POA: Diagnosis not present

## 2023-11-09 LAB — BASIC METABOLIC PANEL WITH GFR
Anion gap: 7 (ref 5–15)
BUN: 22 mg/dL — ABNORMAL HIGH (ref 6–20)
CO2: 31 mmol/L (ref 22–32)
Calcium: 9.1 mg/dL (ref 8.9–10.3)
Chloride: 103 mmol/L (ref 98–111)
Creatinine, Ser: 0.84 mg/dL (ref 0.61–1.24)
GFR, Estimated: >60 mL/min (ref >60)
Glucose, Bld: 122 mg/dL — ABNORMAL HIGH (ref 70–99)
Potassium: 4 mmol/L (ref 3.5–5.1)
Sodium: 141 mmol/L (ref 135–145)

## 2023-11-09 LAB — CBC WITH DIFFERENTIAL/PLATELET
Abs Immature Granulocytes: 0.05 10*3/uL (ref 0.00–0.07)
Basophils Absolute: 0 10*3/uL (ref 0.0–0.1)
Basophils Relative: 0 %
Eosinophils Absolute: 0.1 10*3/uL (ref 0.0–0.5)
Eosinophils Relative: 1 %
HCT: 49.4 % (ref 39.0–52.0)
Hemoglobin: 13.6 g/dL (ref 13.0–17.0)
Immature Granulocytes: 0 %
Lymphocytes Relative: 16 %
Lymphs Abs: 2 10*3/uL (ref 0.7–4.0)
MCH: 26.5 pg (ref 26.0–34.0)
MCHC: 27.5 g/dL — ABNORMAL LOW (ref 30.0–36.0)
MCV: 96.1 fL (ref 80.0–100.0)
Monocytes Absolute: 0.9 10*3/uL (ref 0.1–1.0)
Monocytes Relative: 7 %
Neutro Abs: 9.3 10*3/uL — ABNORMAL HIGH (ref 1.7–7.7)
Neutrophils Relative %: 76 %
Platelets: 200 10*3/uL (ref 150–400)
RBC: 5.14 MIL/uL (ref 4.22–5.81)
RDW: 15.1 % (ref 11.5–15.5)
WBC: 12.3 10*3/uL — ABNORMAL HIGH (ref 4.0–10.5)
nRBC: 0 % (ref 0.0–0.2)

## 2023-11-09 LAB — GLUCOSE, CAPILLARY
Glucose-Capillary: 122 mg/dL — ABNORMAL HIGH (ref 70–99)
Glucose-Capillary: 163 mg/dL — ABNORMAL HIGH (ref 70–99)
Glucose-Capillary: 222 mg/dL — ABNORMAL HIGH (ref 70–99)
Glucose-Capillary: 111 mg/dL — ABNORMAL HIGH (ref 70–99)

## 2023-11-09 MED ORDER — SACUBITRIL-VALSARTAN 24-26 MG PO TABS
1.0000 | ORAL_TABLET | Freq: Two times a day (BID) | ORAL | Status: DC
  Administered 2023-11-09 – 2023-11-15 (×13): 1 via ORAL
  Filled 2023-11-09 (×13): qty 1

## 2023-11-09 NOTE — Progress Notes (Signed)
 Heart Failure Stewardship Pharmacy Note  PCP: Oswaldo Conroy, MD PCP-Cardiologist: None  HPI: Clifford Nichols is a 43 y.o. male with history of severe OSA, chronic respiraotry failure on 4L, extreme obesity with Alveolar hypoventilation, COVID ARDS in 2021, DM, OSA, and HTN who presented with shortness of breath. Found to be hypoxic on home 4L of O2, transitioned to CPAP, then eventually intubated after becoming unresponsive. Patient was febrile on admission and blood cultures grew staphylococcus hominis and epidermidis, though possibly a contaminant. On admission, BNP was 243.7 (falsely low due to morbid obesity), HS-troponin was 95, lactic acid was 1.3 > 1.8, A1c was 6.8, and PCT was 0.27. Chest x-ray noted low lung volumes with mild right basilar atelectasis and/or infiltrate. CT did not note PE, however did show marked severity areas of bilateral lower lobe scarring, atelectasis, or infiltrate. Most recent CXR noted low volume under penetrated film with vascular congestion and probable retrocardiac collapse/consolidation.   Pertinent Cardiac History: Echocardiogram 11/2019 with LVEF 55-60%. Echocardiogram this admission noted LVEF of 35-40%, moderately reduced RV function.  Pertinent Lab Values: Creatinine, Ser  Date Value Ref Range Status  11/09/2023 0.84 0.61 - 1.24 mg/dL Final   BUN  Date Value Ref Range Status  11/09/2023 22 (H) 6 - 20 mg/dL Final   Potassium  Date Value Ref Range Status  11/09/2023 4.0 3.5 - 5.1 mmol/L Final   Sodium  Date Value Ref Range Status  11/09/2023 141 135 - 145 mmol/L Final   B Natriuretic Peptide  Date Value Ref Range Status  10/31/2023 129.1 (H) 0.0 - 100.0 pg/mL Final    Comment:    Performed at [facility], [address] [place], [place] [address]   Magnesium  Date Value Ref Range Status  11/06/2023 2.2 1.7 - 2.4 mg/dL Final    Comment:    Performed at [facility], [address] [place], [place] [address]    Hgb A1c MFr Bld  Date Value Ref Range Status  10/31/2023 6.8 (H) 4.8 - 5.6 % Final    Comment:    (NOTE)         Prediabetes: 5.7 - 6.4         Diabetes: >6.4         Glycemic control for adults with diabetes: <7.0    TSH  Date Value Ref Range Status  11/03/2023 0.954 0.350 - 4.500 uIU/mL Final    Comment:    Performed by a 3rd Generation assay with a functional sensitivity of <=0.01 uIU/mL. Performed at [facility], [address] [place], [place] [address]     Vital Signs: Admission weight:414.91 lbs Temp:  [98 F (36.7 C)-98.4 F (36.9 C)] 98 F (36.7 C) (04/17 0746) Pulse Rate:  [58-115] 98 (04/17 0800) Cardiac Rhythm: Normal sinus rhythm (04/17 0800) Resp:  [17-39] 27 (04/17 0800) BP: (111-182)/(44-143) 128/65 (04/17 0800) SpO2:  [57 %-100 %] 97 % (04/17 0800) FiO2 (%):  [40 %-45 %] 40 % (04/17 0738) Weight:  [163.8 kg (361 lb 1.8 oz)] 163.8 kg (361 lb 1.8 oz) (04/17 0500)  Intake/Output Summary (Last 24 hours) at 11/09/2023 0949 Last data filed at 11/09/2023 0400 Gross per 24 hour  Intake 250 ml  Output 1825 ml  Net -1575 ml   Current Heart Failure Medications:  Loop diuretic: none Beta-Blocker: none ACEI/ARB/ARNI: none MRA: none SGLT2i: none Other: none  Prior to admission Heart Failure Medications:  Loop diuretic: none Beta-Blocker: none ACEI/ARB/ARNI: lisinopril 20 mg daily MRA:  none SGLT2i: none Other: amlodipine 10 mg daily  Assessment: 1. 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 NYHA class IV symptoms.  -Symptoms: Reports feeling much better, however he remains on 50L HFNC. He reports symptoms have continued to improve. -Volume: Volume status difficult to assess due to body habitus. Weight is down 2 lbs. Received 2 days total of Lasix and 3 days of acetazolamide with none for several days. Creatinine stable. Would consider resuming diuretics. -Hemodynamics: BP is  elevated. HR 90s. -BM:WUXLK consider a beta-1 selective agent closer to discharge. Recommend against carvedilol given extensive lung disease history.  -ACEI/ARB/ARNI: BP is now up, can consider starting Entresto 24-26 mg BID if MD believes to be low risk for reintubation. Given that he remains on 50L O2, it may be better to optimize spironolactone first. -MRA: Doing well on spironolactone 12.5 mg daily. Consider increasing today.  -SGLT2i: Would consider adding today given no effect on BP. Patient is mobile at baseline and not at high risk of UTI. Currently on insulin with relatively controlled BG > insulin may require adjustment. -Consider swapping Trulicity to [facility] outpatient for OSA indication and greater weight loss.  Plan: 1) Medication changes recommended at this time: -Will discuss with MD increasing spironolactone to 25 mg daily vs adding Entresto 24-26 mg BID.  2) Patient assistance: -Copay for Entresto, Farxiga, Jardiance is $4.80  3) Education: - Patient has been educated on current HF medications and potential additions to HF medication regimen - Patient verbalizes understanding that over the next few months, these medication doses may change and more medications may be added to optimize HF regimen - Patient has been educated on basic disease state pathophysiology and goals of therapy  Medication Assistance / Insurance Benefits Check: Does the patient have prescription insurance?    Type of insurance plan:  Does the patient qualify for medication assistance through manufacturers or grants? No   Outpatient Pharmacy: Prior to admission outpatient pharmacy: [facility] Is the patient agreeable to switch to [facility] Outpatient Pharmacy?: Yes Is the patient willing to utilize a [facility] pharmacy at discharge?: Yes  Please do not hesitate to reach out with questions or concerns,  Bevely Brush, PharmD, CPP, BCPS Heart Failure Pharmacist  Phone - [phone] 11/09/2023  9:49 AM

## 2023-11-09 NOTE — Plan of Care (Signed)
  Problem: Education: Goal: Knowledge of General Education information will improve Description: Including pain rating scale, medication(s)/side effects and non-pharmacologic comfort measures Outcome: Progressing   Problem: Health Behavior/Discharge Planning: Goal: Ability to manage health-related needs will improve Outcome: Progressing   Problem: Clinical Measurements: Goal: Ability to maintain clinical measurements within normal limits will improve Outcome: Progressing Goal: Will remain free from infection Outcome: Progressing Goal: Diagnostic test results will improve Outcome: Progressing Goal: Respiratory complications will improve Outcome: Progressing Goal: Cardiovascular complication will be avoided Outcome: Progressing   Problem: Activity: Goal: Risk for activity intolerance will decrease Outcome: Progressing   Problem: Nutrition: Goal: Adequate nutrition will be maintained Outcome: Progressing   Problem: Coping: Goal: Level of anxiety will decrease Outcome: Progressing   Problem: Elimination: Goal: Will not experience complications related to bowel motility Outcome: Progressing Goal: Will not experience complications related to urinary retention Outcome: Progressing   Problem: Pain Managment: Goal: General experience of comfort will improve and/or be controlled Outcome: Progressing   Problem: Safety: Goal: Ability to remain free from injury will improve Outcome: Progressing   Problem: Skin Integrity: Goal: Risk for impaired skin integrity will decrease Outcome: Progressing   Problem: Education: Goal: Ability to describe self-care measures that may prevent or decrease complications (Diabetes Survival Skills Education) will improve Outcome: Progressing Goal: Individualized Educational Video(s) Outcome: Progressing   Problem: Coping: Goal: Ability to adjust to condition or change in health will improve Outcome: Progressing   Problem: Fluid  Volume: Goal: Ability to maintain a balanced intake and output will improve Outcome: Progressing   Problem: Health Behavior/Discharge Planning: Goal: Ability to identify and utilize available resources and services will improve Outcome: Progressing Goal: Ability to manage health-related needs will improve Outcome: Progressing   Problem: Metabolic: Goal: Ability to maintain appropriate glucose levels will improve Outcome: Progressing   Problem: Nutritional: Goal: Maintenance of adequate nutrition will improve Outcome: Progressing Goal: Progress toward achieving an optimal weight will improve Outcome: Progressing   Problem: Skin Integrity: Goal: Risk for impaired skin integrity will decrease Outcome: Progressing   Problem: Tissue Perfusion: Goal: Adequacy of tissue perfusion will improve Outcome: Progressing   Problem: Activity: Goal: Ability to tolerate increased activity will improve Outcome: Progressing   Problem: Respiratory: Goal: Ability to maintain a clear airway and adequate ventilation will improve Outcome: Progressing   Problem: Role Relationship: Goal: Method of communication will improve Outcome: Progressing   Problem: Education: Goal: Ability to demonstrate management of disease process will improve Outcome: Progressing   Problem: Education: Goal: Ability to verbalize understanding of medication therapies will improve Outcome: Progressing   Problem: Education: Goal: Individualized Educational Video(s) Outcome: Progressing   Problem: Cardiac: Goal: Ability to achieve and maintain adequate cardiopulmonary perfusion will improve Outcome: Progressing

## 2023-11-09 NOTE — Progress Notes (Signed)
 1330 patient ambulated on 4L [place] a quarter of the way around the unit taking a break then back to room with a walker SP02 stayed above 95 the entire time patient tolerated well

## 2023-11-09 NOTE — Progress Notes (Signed)
 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 Now extubated    PT Comments  Patient progressing towards physical therapy goals. Continues to be on HHFNC this date on 50L. Able to perform repeated sit to stands, ambulation within tubing limitations, and standing marching with spO2 >97% throughout. Good tolerance to activity this date. Motivated to continue to improve to be able to discharge home to his family. Discharge plan remains appropriate.     If plan is discharge home, recommend the following: A little help with walking and/or transfers;A little help with bathing/dressing/bathroom;Assist for transportation;Help with stairs or ramp for entrance   Can travel by private vehicle        Equipment Recommendations  None recommended by PT    Recommendations for Other Services       Precautions / Restrictions Precautions Precautions: Fall Recall of Precautions/Restrictions: Intact Precaution/Restrictions Comments: monitor O2 Restrictions Weight Bearing Restrictions Per Provider Order: No     Mobility  Bed Mobility Overal bed mobility: Needs Assistance Bed Mobility: Supine to Sit     Supine to sit: Supervision          Transfers Overall transfer level: Needs assistance Equipment used: Rolling Aarna Mihalko (2 wheels) Transfers: Sit to/from Stand, Bed to chair/wheelchair/BSC Sit to Stand: Supervision   Step pivot transfers: Contact guard assist            Ambulation/Gait Ambulation/Gait assistance: Contact guard assist Gait Distance (Feet): 20 Feet (combination of fwd/bwd 5' intervals) Assistive device: Rolling  Gurtej Noyola (2 wheels) Gait Pattern/deviations: Step-through pattern, Decreased stride length Gait velocity: decreased         Stairs             Wheelchair Mobility     Tilt Bed    Modified Rankin (Stroke Patients Only)       Balance Overall balance assessment: Mild deficits observed, not formally tested                                          Communication Communication Communication: No apparent difficulties  Cognition Arousal: Alert Behavior During Therapy: WFL for tasks assessed/performed   PT - Cognitive impairments: No apparent impairments                         Following commands: Intact      Cueing    Exercises Other Exercises Other Exercises: standing marching x 10 each LE Other Exercises: Sit to stand x 10 with single UE support    General Comments General comments (skin integrity, edema, etc.): on 50L HHFNC, spO2 >97% with activity      Pertinent Vitals/Pain Pain Assessment Pain Assessment: No/denies pain    Home Living                          Prior Function            PT Goals (current goals can now be found in the care plan section) Acute Rehab PT Goals Patient Stated Goal: to go home  PT Goal Formulation: With patient/family Time For Goal Achievement: 11/19/23 Potential to Achieve Goals: Fair Progress towards PT goals: Progressing toward goals    Frequency    Min 3X/week      PT Plan      Co-evaluation              AM-PAC PT "6 Clicks" Mobility   Outcome Measure  Help needed turning from your back to your side while in a flat bed without using bedrails?: A Little Help needed moving from lying on your back to sitting on the side of a flat bed without using bedrails?: A Little Help needed moving to and from a bed to a chair (including a wheelchair)?: A Little Help needed standing up from a chair using your arms (e.g., wheelchair or bedside chair)?: A Little Help needed to  walk in hospital room?: A Little Help needed climbing 3-5 steps with a railing? : Total 6 Click Score: 16    End of Session Equipment Utilized During Treatment: Oxygen Activity Tolerance: Patient tolerated treatment well Patient left: in chair;with call bell/phone within reach;with family/visitor present Nurse Communication: Mobility status PT Visit Diagnosis: Muscle weakness (generalized) (M62.81);Difficulty in walking, not elsewhere classified (R26.2)     Time: 1610-9604 PT Time Calculation (min) (ACUTE ONLY): 15 min  Charges:    $Therapeutic Activity: 8-22 mins PT General Charges $$ ACUTE PT VISIT: 1 Visit                     Janine Melbourne, PT, DPT Physical Therapist - [facility]    Jsaon Yoo A Kiaan Overholser 11/09/2023, 11:04 AM

## 2023-11-09 NOTE — Progress Notes (Addendum)
 Progress Note   Patient: Clifford Nichols ZOX:[id number] DOB: February 22, 1981 DOA: 10/30/2023     9 DOS: the patient was seen and examined on 11/09/2023   Brief hospital course: 43 y.o with significant PMH of  severe OSA, chronic respiraotry failure on 4L, extreme obesity with Alveolar hypoventilation, DM and HTN who presented to the ED with chief complaints of progressive shortness of breath.   Per ED reports, EMS was called for worsening respiratory distress. On EMS arrival, patient was found with oxygen saturation in the 80's on his normal 4L. He was placed on NRB and then transitioned to CPAP. He was treated with Duonebs and IV solumedrol enroute, shortly after he became unresponsive.   ED Course: Initial vital signs showed HR of 96 beats/minute, BP 136/25mm Hg, the RR 28breaths/minute, and the oxygen saturation 83% on CPAP and a temperature of 101.26F (38.4C). Patient was lethargic, moaned intermittently, and responded with one-word answers; symmetric movement in the arms and legs was observed. Due to worsening mental status on CPAP he was intubated for airway protection.  Patient was subsequently extubated and transferred to TRH service on 11/06/2023. Other hospital course as noted below     Assessment and Plan:   Severe ACUTE on chronic hypoxic and Hypercapnic Respiratory Failure Likely multifactorial in the setting of obesity hypoventilation syndrome as well as severe bilateral pneumonia CTA chest negative for PE but consistent with an infectious process. Patient extubated on 11/05/2023 Currently on FiO2 of 45% sitting in a chair on high flow nasal oxygen According to patient he uses 4 L of oxygen at home Continue to wean down oxygen as tolerated Patient has completed antibiotic course Continue prednisone  taper     Sepsis with septic shock due to suspected pneumonia STAPH HOMINIS BACTEREMIA Has completed antibiotic course Monitor respiratory function as above   Mildly Elevated Troponin,  suspect demand ischemia PMHx: HLD, HTN -continue heart failure meds as shown below     Hypokalemia-continue to replete and monitor closely   ACUTE SYSTOLIC CARDIAC FAILURE- EF 35% Cardiomyopathy and PULM HTN Lasix  as tolerated Monitor daily weight Monitor input and output GDMT as blood pressure allows Continue Farxiga , spironolactone  as well as adding Entresto  today as discussed with heart failure pharmacist Patient will follow-up as an outpatient   ACUTE KIDNEY INJURY/Renal Failure-improved Monitor renal function closely Avoid nephrotoxic agents     Diabetes mellitus type 2 Monitor glucose closely Continue insulin  therapy at this time   Morbid obesity with BMI 53.7 Counseled on weight loss with diet and exercise when medically stable   Family communication.  Discussed with patient's mom at bedside   Subjective:  Patient seen and examined at bedside this morning Oxygen requirement has significantly improved to 45% FiO2 with a flow rate of 50 He admits to improvement in respiratory function Denies nausea vomiting abdominal pain or chest pain   Physical Exam: GENERAL:critically ill appearing, on high flow nasal oxygen EYES: Pupils equal, round, reactive to light.  No scleral icterus.  MOUTH: Moist mucosal membrane.  NECK: Supple.  PULMONARY: Lungs clear to auscultation, CARDIOVASCULAR: S1 and S2.  Regular rate and rhythm GASTROINTESTINAL: Soft, nontender, -distended. Positive bowel sounds.  MUSCULOSKELETAL: No swelling, clubbing, or edema.  NEUROLOGIC: Alert and oriented x 3 SKIN:normal, warm to touch, Capillary refill delayed  Pulses present bilaterally   Disposition: Pending improvement in respiratory function as patient is currently on high flow nasal OXYGENATION       Data Reviewed:    Latest Ref Rng & Units 11/09/2023  4:35 AM 11/08/2023    4:09 AM 11/06/2023    4:06 AM  CBC  WBC 4.0 - 10.5 K/uL 12.3  11.5  11.5   Hemoglobin 13.0 - 17.0 g/dL 16.1  09.6   04.5   Hematocrit 39.0 - 52.0 % 49.4  46.9  47.4   Platelets 150 - 400 K/uL 200  187  168        Latest Ref Rng & Units 11/09/2023    4:35 AM 11/08/2023    4:09 AM 11/07/2023   12:15 PM  BMP  Glucose 70 - 99 mg/dL 409  811  914   BUN 6 - 20 mg/dL 22  25  28    Creatinine 0.61 - 1.24 mg/dL 7.82  9.56  2.13   Sodium 135 - 145 mmol/L 141  140  137   Potassium 3.5 - 5.1 mmol/L 4.0  4.1  4.1   Chloride 98 - 111 mmol/L 103  106  105   CO2 22 - 32 mmol/L 31  29  27    Calcium  8.9 - 10.3 mg/dL 9.1  9.1  9.1       Vitals:   11/09/23 1200 11/09/23 1300 11/09/23 1314 11/09/23 1400  BP: (!) 161/95 (!) 144/77    Pulse: (!) 110 (!) 107    Resp: 15 (!) 27    Temp:    97.9 F (36.6 C)  TempSrc:    Oral  SpO2: 92% 100% 95%   Weight:      Height:         Author: Ezzard Holms, MD 11/09/2023 3:20 PM  For on call review [email or web address].

## 2023-11-10 DIAGNOSIS — J9621 Acute and chronic respiratory failure with hypoxia: Secondary | ICD-10-CM | POA: Diagnosis not present

## 2023-11-10 DIAGNOSIS — J9622 Acute and chronic respiratory failure with hypercapnia: Secondary | ICD-10-CM | POA: Diagnosis not present

## 2023-11-10 LAB — CBC WITH DIFFERENTIAL/PLATELET
Abs Immature Granulocytes: 0.05 10*3/uL (ref 0.00–0.07)
Basophils Absolute: 0 10*3/uL (ref 0.0–0.1)
Basophils Relative: 0 %
Eosinophils Absolute: 0.1 10*3/uL (ref 0.0–0.5)
Eosinophils Relative: 1 %
HCT: 49.8 % (ref 39.0–52.0)
Hemoglobin: 13.7 g/dL (ref 13.0–17.0)
Immature Granulocytes: 1 %
Lymphocytes Relative: 20 %
Lymphs Abs: 2 10*3/uL (ref 0.7–4.0)
MCH: 26.1 pg (ref 26.0–34.0)
MCHC: 27.5 g/dL — ABNORMAL LOW (ref 30.0–36.0)
MCV: 94.9 fL (ref 80.0–100.0)
Monocytes Absolute: 0.7 10*3/uL (ref 0.1–1.0)
Monocytes Relative: 7 %
Neutro Abs: 7.1 10*3/uL (ref 1.7–7.7)
Neutrophils Relative %: 71 %
Platelets: 190 10*3/uL (ref 150–400)
RBC: 5.25 MIL/uL (ref 4.22–5.81)
RDW: 15.1 % (ref 11.5–15.5)
WBC: 10.1 10*3/uL (ref 4.0–10.5)
nRBC: 0 % (ref 0.0–0.2)

## 2023-11-10 LAB — BASIC METABOLIC PANEL WITH GFR
Anion gap: 6 (ref 5–15)
BUN: 19 mg/dL (ref 6–20)
CO2: 31 mmol/L (ref 22–32)
Calcium: 8.9 mg/dL (ref 8.9–10.3)
Chloride: 103 mmol/L (ref 98–111)
Creatinine, Ser: 0.71 mg/dL (ref 0.61–1.24)
GFR, Estimated: >60 mL/min (ref >60)
Glucose, Bld: 134 mg/dL — ABNORMAL HIGH (ref 70–99)
Potassium: 3.6 mmol/L (ref 3.5–5.1)
Sodium: 140 mmol/L (ref 135–145)

## 2023-11-10 LAB — GLUCOSE, CAPILLARY
Glucose-Capillary: 125 mg/dL — ABNORMAL HIGH (ref 70–99)
Glucose-Capillary: 184 mg/dL — ABNORMAL HIGH (ref 70–99)
Glucose-Capillary: 170 mg/dL — ABNORMAL HIGH (ref 70–99)
Glucose-Capillary: 184 mg/dL — ABNORMAL HIGH (ref 70–99)

## 2023-11-10 MED ORDER — POLYVINYL ALCOHOL 1.4 % OP SOLN
1.0000 [drp] | OPHTHALMIC | Status: DC | PRN
  Administered 2023-11-10 – 2023-11-12 (×4): 1 [drp] via OPHTHALMIC
  Filled 2023-11-10: qty 15

## 2023-11-10 NOTE — Plan of Care (Signed)

## 2023-11-10 NOTE — Progress Notes (Signed)
 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 Now extubated    PT Comments  Patient is agreeable to PT session. He is eager to discharge home. He is down to 4 L02. Patient ambulated around the nursing station using a bariatric rolling walker with supervision and occasional cues for energy conservation techniques. Sp02 88% or above with mobility. Recommend to continue PT to maximize independence and decrease caregiver burden.    If plan is discharge home, recommend the following: A little help with walking and/or transfers;A little help with bathing/dressing/bathroom;Assist for transportation;Help with stairs or ramp for entrance   Can travel by private vehicle        Equipment Recommendations  None recommended by PT    Recommendations for Other Services       Precautions / Restrictions Precautions Precautions: Fall Recall of Precautions/Restrictions: Intact Restrictions Weight Bearing Restrictions Per Provider Order: No     Mobility  Bed Mobility Overal bed mobility: Modified Independent                  Transfers Overall transfer level: Needs assistance Equipment used: Rolling walker (2 wheels) Transfers: Sit to/from Stand Sit to Stand: Supervision                Ambulation/Gait Ambulation/Gait assistance: Supervision Gait Distance (Feet): 160 Feet Assistive device: Rolling walker (2 wheels) Gait Pattern/deviations: Step-through pattern Gait velocity: decreased     General Gait Details: patient walked around the nursing station with no rest breaks required. cues for energy conservation techniques.  Sp02 88% or above on 4 L 02. mild dyspnea with ambulation   Stairs             Wheelchair Mobility     Tilt Bed    Modified Rankin (Stroke Patients Only)       Balance Overall balance assessment: Needs assistance Sitting-balance support: Feet supported Sitting balance-Leahy Scale: Fair     Standing balance support: Bilateral upper extremity supported Standing balance-Leahy Scale: Fair Standing balance comment: using rolling walker for support                            Communication Communication Communication: No apparent difficulties  Cognition Arousal: Alert Behavior During Therapy: WFL for tasks assessed/performed   PT - Cognitive impairments: No apparent impairments                         Following commands: Intact      Cueing Cueing Techniques: Verbal cues  Exercises      General Comments General comments (skin integrity, edema, etc.): heart rate up to 136 bpm with walking.      Pertinent Vitals/Pain Pain Assessment Pain Assessment: No/denies pain    Home Living                          Prior Function            PT Goals (current goals can now be found in the care plan section) Acute Rehab PT Goals Patient Stated Goal: to go home PT Goal Formulation:  With patient/family Time For Goal Achievement: 11/19/23 Potential to Achieve Goals: Fair Progress towards PT goals: Progressing toward goals    Frequency    Min 3X/week      PT Plan      Co-evaluation              AM-PAC PT "6 Clicks" Mobility   Outcome Measure  Help needed turning from your back to your side while in a flat bed without using bedrails?: A Little Help needed moving from lying on your back to sitting on the side of a flat bed without using bedrails?: A Little Help needed moving to and from a bed to a chair (including a wheelchair)?: A Little Help needed standing up from a chair using your arms (e.g., wheelchair or bedside  chair)?: A Little Help needed to walk in hospital room?: A Little Help needed climbing 3-5 steps with a railing? : Total 6 Click Score: 16    End of Session Equipment Utilized During Treatment: Oxygen Activity Tolerance: Patient limited by fatigue;Patient tolerated treatment well Patient left: in chair;with call bell/phone within reach;with family/visitor present Nurse Communication: Mobility status PT Visit Diagnosis: Muscle weakness (generalized) (M62.81);Difficulty in walking, not elsewhere classified (R26.2)     Time: 1000-1015 PT Time Calculation (min) (ACUTE ONLY): 15 min  Charges:    $Therapeutic Activity: 8-22 mins PT General Charges $$ ACUTE PT VISIT: 1 Visit                    Ozie Bo, PT, MPT    Erlene Hawks 11/10/2023, 11:45 AM

## 2023-11-10 NOTE — Progress Notes (Signed)
   11/10/23 1330  Spiritual Encounters  Type of Visit Initial  Care provided to: Pt and family (Wife and Mother in [place] at bedside)  Referral source Family  Reason for visit Routine spiritual support  OnCall Visit No  Spiritual Framework  Presenting Themes Meaning/purpose/sources of inspiration;Goals in life/care;Values and beliefs;Courage hope and growth  Interventions  Spiritual Care Interventions Made Established relationship of care and support;Compassionate presence;Reflective listening;Explored values/beliefs/practices/strengths;Meaning making;Encouragement  Intervention Outcomes  Outcomes Connection to spiritual care;Awareness around self/spiritual resourses;Connection to values and goals of care;Awareness of health;Awareness of support

## 2023-11-10 NOTE — Progress Notes (Signed)
 Heart Failure Stewardship Pharmacy Note  PCP: Dionicia Frater, MD PCP-Cardiologist: None  HPI: Clifford Nichols is a 43 y.o. male with history of severe OSA, chronic respiraotry failure on 4L, extreme obesity with Alveolar hypoventilation, COVID ARDS in 2021, DM, OSA, and HTN who presented with shortness of breath. Found to be hypoxic on home 4L of O2, transitioned to CPAP, then eventually intubated after becoming unresponsive. Patient was febrile on admission and blood cultures grew staphylococcus hominis and epidermidis, though possibly a contaminant. On admission, BNP was 243.7 (falsely low due to morbid obesity), HS-troponin was 95, lactic acid was 1.3 > 1.8, A1c was 6.8, and PCT was 0.27. Chest x-ray noted low lung volumes with mild right basilar atelectasis and/or infiltrate. CT did not note PE, however did show marked severity areas of bilateral lower lobe scarring, atelectasis, or infiltrate. Most recent CXR noted low volume under penetrated film with vascular congestion and probable retrocardiac collapse/consolidation.   Pertinent Cardiac History: Echocardiogram 11/2019 with LVEF 55-60%. Echocardiogram this admission noted LVEF of 35-40%, moderately reduced RV function.  Pertinent Lab Values: Creatinine, Ser  Date Value Ref Range Status  11/10/2023 0.71 0.61 - 1.24 mg/dL Final   BUN  Date Value Ref Range Status  11/10/2023 19 6 - 20 mg/dL Final   Potassium  Date Value Ref Range Status  11/10/2023 3.6 3.5 - 5.1 mmol/L Final   Sodium  Date Value Ref Range Status  11/10/2023 140 135 - 145 mmol/L Final   B Natriuretic Peptide  Date Value Ref Range Status  10/31/2023 129.1 (H) 0.0 - 100.0 pg/mL Final    Comment:    Performed at [facility], [address] [place], [place] [address]   Magnesium   Date Value Ref Range Status  11/06/2023 2.2 1.7 - 2.4 mg/dL Final    Comment:    Performed at [facility], [address] [place], [place] [address]   Hgb  A1c MFr Bld  Date Value Ref Range Status  10/31/2023 6.8 (H) 4.8 - 5.6 % Final    Comment:    (NOTE)         Prediabetes: 5.7 - 6.4         Diabetes: >6.4         Glycemic control for adults with diabetes: <7.0    TSH  Date Value Ref Range Status  11/03/2023 0.954 0.350 - 4.500 uIU/mL Final    Comment:    Performed by a 3rd Generation assay with a functional sensitivity of <=0.01 uIU/mL. Performed at [facility], [address] [place], [place] [address]     Vital Signs: Admission weight:414.91 lbs Temp:  [97.9 F (36.6 C)-99 F (37.2 C)] 97.9 F (36.6 C) (04/18 0800) Pulse Rate:  [82-112] 82 (04/18 0800) Cardiac Rhythm: Normal sinus rhythm (04/18 0800) Resp:  [15-31] 22 (04/18 0800) BP: (118-162)/(51-96) 138/53 (04/18 0800) SpO2:  [92 %-100 %] 94 % (04/18 0800) FiO2 (%):  [40 %] 40 % (04/18 0800) Weight:  [164.1 kg (361 lb 12.4 oz)] 164.1 kg (361 lb 12.4 oz) (04/18 0500)  Intake/Output Summary (Last 24 hours) at 11/10/2023 9147 Last data filed at 11/10/2023 0800 Gross per 24 hour  Intake 1220 ml  Output 2875 ml  Net -1655 ml   Current Heart Failure Medications:  Loop diuretic: none Beta-Blocker: none ACEI/ARB/ARNI: Entresto  24-26 mg BID MRA: spironolactone  12.5 mg daily SGLT2i: Farxiga  10 mg daily Other: none  Prior to admission Heart Failure Medications:  Loop diuretic: none Beta-Blocker:  none ACEI/ARB/ARNI: lisinopril 20 mg daily MRA: none SGLT2i: none Other: amlodipine 10 mg daily  Assessment: 1. 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 NYHA class IV symptoms.  -Symptoms: Reports feeling much better. Not on O2 during conversation this morning. He has eye irritation that he reports secondary to BiPAP. -Volume: Volume status difficult to assess due to body habitus. Weight is stable. Received 2 days total of Lasix  and 3 days of acetazolamide  with none for several days.  Creatinine stable.   -Hemodynamics: BP is elevated. HR 90-100s. -WU:JWJXB consider a beta-1 selective agent closer to discharge. Recommend against carvedilol given extensive lung disease history.  -ACEI/ARB/ARNI: BP remains mildly elevated on Entresto  24-26 mg BID. Can consider increasing if required for BP control. -MRA: Doing well on spironolactone  12.5 mg daily. Consider increasing today.  -SGLT2i: Continue Farxiga  10 mg daily -Consider swapping Trulicity to Mounjaro  outpatient for OSA indication and greater weight loss.  Plan: 1) Medication changes recommended at this time: -Consider increasing spironolactone  to 25 mg daily  2) Patient assistance: -Copay for Entresto , Farxiga , Jardiance is $4.80  3) Education: - Patient has been educated on current HF medications and potential additions to HF medication regimen - Patient verbalizes understanding that over the next few months, these medication doses may change and more medications may be added to optimize HF regimen - Patient has been educated on basic disease state pathophysiology and goals of therapy  Medication Assistance / Insurance Benefits Check: Does the patient have prescription insurance?    Type of insurance plan:  Does the patient qualify for medication assistance through manufacturers or grants? No   Outpatient Pharmacy: Prior to admission outpatient pharmacy: [facility] Is the patient agreeable to switch to [facility] Outpatient Pharmacy?: Yes Is the patient willing to utilize a [facility] pharmacy at discharge?: Yes  Please do not hesitate to reach out with questions or concerns,  Bevely Brush, PharmD, CPP, BCPS Heart Failure Pharmacist  Phone - [phone] 11/10/2023 8:38 AM

## 2023-11-10 NOTE — TOC Progression Note (Addendum)
 Transition of Care [name]) - Progression Note    Patient Details  Name: Clifford Nichols MRN: [id number] Date of Birth: 01-Dec-1980  [name] CM/SW Contact  [name], LCSW Phone Number: 11/10/2023, 11:13 AM  Clinical Narrative:    CSW spoke with patient. He inquired if his Cpap has been arranged. Called [name] with [name] - he states they have the referral from [name] but there was an issue with it - [name] will reach out to [name] but states they are closed until Monday. He states [name] needs to fix the order  - asked if CSW can have the MD here fix the order so he can discharge - [name] is checking.  12:20- Awaiting response from [name] with [name]. Checked with RN - patient not medically ready to DC yet.  2:10- [name] states the Bipap ST that [name] ordered outpatient will not best meet patient's needs and that patient needs an NIV. [name] sent over paperwork needed to order NIV - updated MD and RN. Per [name], they may be able to have an NIV delivered as early as tomorrow. Asked MD for [name].  2:45- Form signed by MD and sent to [name].  3:20- RN and NP assisted - unable to find PFT on hard chart or electronic chart. Per [name], sleep study will not work. Per [name], it has to be the PFT or bedside spirometry with [name]. RN is reaching out to RT.   Expected Discharge Plan: Home w Home Health Services Barriers to Discharge: No Barriers Identified  Expected Discharge Plan and Services   Discharge Planning Services: CM Consult Post Acute Care Choice: Home Health Living arrangements for the past 2 months: Single Family Home                 DME Arranged: 3-N-1, Shower stool (Bariatric 3 in 1 and Bariatric shower chair) DME Agency: [employer] Date DME Agency Contacted: 11/07/23   Representative spoke with at DME Agency: [name] HH Arranged: RN, OT, PT [name] Agency: [name] Date [name] Agency Contacted: 11/07/23   Representative spoke with at [name]  Agency: [name]   Social Determinants of Health (SDOH) Interventions SDOH Screenings   Food Insecurity: Food Insecurity Present (11/08/2023)  Housing: Low Risk  (11/08/2023)  Transportation Needs: Unmet Transportation Needs (11/08/2023)  Utilities: Not At Risk (10/31/2023)  Financial Resource Strain: High Risk (11/08/2023)  Tobacco Use: Low Risk  (11/08/2023)    Readmission Risk Interventions     No data to display

## 2023-11-10 NOTE — Progress Notes (Addendum)
 Progress Note   Patient: Clifford Nichols GNF:[id number] DOB: [birth year] DOA: 10/30/2023     10 DOS: the patient was seen and examined on 11/10/2023   Brief hospital course: In summary,  patient is a 43 y.o with significant PMH of severe OSA, chronic respiraotry failure on 4L, extreme obesity with Alveolar hypoventilation, DM and HTN who presented to the ED with chief complaints of progressive shortness of breath.  He was admitted to stepdown unit on account of acute respiratory failure and required noninvasive positive pressure ventilation.  He failed treatment and hence was intubated for airway protection.  S/p extubation on 4/13//2025.  Clinically has remained stable postextubation.  Requiring CPAP at night.  Assessment and Plan:  ACUTE on chronic hypoxic and Hypercapnic Respiratory Failure Likely multifactorial in the setting of obesity hypoventilation syndrome as well as severe bilateral pneumonia CTA chest negative for PE but consistent with an infectious process. 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 Removal of the ventilator may cause serious harm to the patient, exacerbation of condition and hospital readmission. Bilevel/RAD has been tried and failed to maintain or stabilize the patient. Bilevel cannot meet current volume requirements. Patient requires frequent durations of ventilatory support. Intermittent usage is insufficient.    Sepsis with septic shock due to suspected pneumonia STAPH HOMINIS BACTEREMIA Has completed antibiotic course Monitor respiratory function as above   Mildly Elevated Troponin, suspect demand ischemia from respiratory failure. -continue heart failure meds as shown below   Hypokalemia-resolved.  Potassium replacement  therapy in place.  Continue to replete and monitor closely   ACUTE SYSTOLIC CARDIAC FAILURE- EF 35% Cardiomyopathy and PULM HTN Lasix  as tolerated Monitor daily weight Monitor input and output GDMT as blood pressure allows Continue Farxiga , spironolactone  as well as adding Entresto  today as discussed with heart failure pharmacist   ACUTE KIDNEY INJURY/Renal Failure-improved Monitor renal function closely Avoid nephrotoxic agents   Diabetes mellitus type 2 Monitor glucose closely Continue insulin  therapy at this time   Morbid obesity with BMI 53.7 Counseled on weight loss with diet and exercise when medically stable       Subjective: Patient clinically feels well and wishes to be discharged home.  Left eye is noted to be hyperemic with lower eyelid drooping.  He attributes it to BiPAP machine, perhaps causing some injury.  Trial of eyedrops will be offered.  Physical Exam: Vitals:   11/10/23 0800 11/10/23 0900 11/10/23 1100 11/10/23 1200  BP: (!) 138/53 128/87 (!) 146/101 (!) 134/92  Pulse: 82 (!) 102 (!) 102 96  Resp: (!) 22 17 (!) 28 (!) 26  Temp: 97.9 F (36.6 C)   97.7 F (36.5 C)  TempSrc: Axillary   Oral  SpO2: 94% 90% 95% 100%  Weight:      Height:       Morbidly obese, central obesity.  General looks well not in any acute distress. HEENT: Slight droop of the left lower eyelid, slight erythema.  Denies any tenderness. Chest: Diminished breath sounds bilaterally due to body habitus Abdomen: Rotund soft nontender CNS shows no obvious focal deficits Skin negative for any new rash  Data Reviewed: {Sodium is 140, potassium 3.6, chloride of 103, bicarb 31, glucose 134, BUN 19, creatinine 0.7 WBC 10.1, hemoglobin 13.7, hematocrit 49.8, platelet count is 180.  Family Communication: Patient's mother at bedside was updated regarding  plan of care  Disposition: Status is: Inpatient Remains inpatient appropriate because: Pending improvement with symptoms.  Planned  Discharge Destination: Home with Home Health with [facility]    Time spent: 35 minutes  Author: Theodora Fish, MD 11/10/2023 12:17 PM  For on call review [email or web address].

## 2023-11-11 DIAGNOSIS — I5021 Acute systolic (congestive) heart failure: Secondary | ICD-10-CM

## 2023-11-11 DIAGNOSIS — G4733 Obstructive sleep apnea (adult) (pediatric): Secondary | ICD-10-CM

## 2023-11-11 DIAGNOSIS — A411 Sepsis due to other specified staphylococcus: Principal | ICD-10-CM

## 2023-11-11 DIAGNOSIS — J9621 Acute and chronic respiratory failure with hypoxia: Secondary | ICD-10-CM | POA: Diagnosis not present

## 2023-11-11 LAB — GLUCOSE, CAPILLARY
Glucose-Capillary: 155 mg/dL — ABNORMAL HIGH (ref 70–99)
Glucose-Capillary: 148 mg/dL — ABNORMAL HIGH (ref 70–99)
Glucose-Capillary: 221 mg/dL — ABNORMAL HIGH (ref 70–99)
Glucose-Capillary: 219 mg/dL — ABNORMAL HIGH (ref 70–99)
Glucose-Capillary: 142 mg/dL — ABNORMAL HIGH (ref 70–99)

## 2023-11-11 MED ORDER — ENOXAPARIN SODIUM 100 MG/ML IJ SOSY
90.0000 mg | PREFILLED_SYRINGE | INTRAMUSCULAR | Status: DC
  Administered 2023-11-11: 90 mg via SUBCUTANEOUS
  Filled 2023-11-11: qty 1

## 2023-11-11 NOTE — Plan of Care (Signed)
  Problem: Education: Goal: Knowledge of General Education information will improve Description: Including pain rating scale, medication(s)/side effects and non-pharmacologic comfort measures Outcome: Progressing   Problem: Health Behavior/Discharge Planning: Goal: Ability to manage health-related needs will improve Outcome: Progressing   Problem: Clinical Measurements: Goal: Ability to maintain clinical measurements within normal limits will improve Outcome: Progressing Goal: Will remain free from infection Outcome: Progressing Goal: Diagnostic test results will improve Outcome: Progressing Goal: Respiratory complications will improve Outcome: Progressing Goal: Cardiovascular complication will be avoided Outcome: Progressing   Problem: Activity: Goal: Risk for activity intolerance will decrease Outcome: Progressing   Problem: Nutrition: Goal: Adequate nutrition will be maintained Outcome: Progressing   Problem: Coping: Goal: Level of anxiety will decrease Outcome: Progressing   Problem: Elimination: Goal: Will not experience complications related to bowel motility Outcome: Progressing Goal: Will not experience complications related to urinary retention Outcome: Progressing   Problem: Pain Managment: Goal: General experience of comfort will improve and/or be controlled Outcome: Progressing   Problem: Safety: Goal: Ability to remain free from injury will improve Outcome: Progressing   Problem: Skin Integrity: Goal: Risk for impaired skin integrity will decrease Outcome: Progressing   Problem: Education: Goal: Ability to describe self-care measures that may prevent or decrease complications (Diabetes Survival Skills Education) will improve Outcome: Progressing Goal: Individualized Educational Video(s) Outcome: Progressing   Problem: Coping: Goal: Ability to adjust to condition or change in health will improve Outcome: Progressing   Problem: Fluid  Volume: Goal: Ability to maintain a balanced intake and output will improve Outcome: Progressing   Problem: Health Behavior/Discharge Planning: Goal: Ability to identify and utilize available resources and services will improve Outcome: Progressing Goal: Ability to manage health-related needs will improve Outcome: Progressing   Problem: Metabolic: Goal: Ability to maintain appropriate glucose levels will improve Outcome: Progressing   Problem: Nutritional: Goal: Maintenance of adequate nutrition will improve Outcome: Progressing Goal: Progress toward achieving an optimal weight will improve Outcome: Progressing   Problem: Skin Integrity: Goal: Risk for impaired skin integrity will decrease Outcome: Progressing   Problem: Tissue Perfusion: Goal: Adequacy of tissue perfusion will improve Outcome: Progressing   Problem: Activity: Goal: Ability to tolerate increased activity will improve Outcome: Progressing   Problem: Respiratory: Goal: Ability to maintain a clear airway and adequate ventilation will improve Outcome: Progressing   Problem: Role Relationship: Goal: Method of communication will improve Outcome: Progressing   Problem: Education: Goal: Ability to demonstrate management of disease process will improve Outcome: Progressing Goal: Ability to verbalize understanding of medication therapies will improve Outcome: Progressing Goal: Individualized Educational Video(s) Outcome: Progressing   Problem: Activity: Goal: Capacity to carry out activities will improve Outcome: Progressing   Problem: Cardiac: Goal: Ability to achieve and maintain adequate cardiopulmonary perfusion will improve Outcome: Progressing

## 2023-11-11 NOTE — Progress Notes (Signed)
   11/11/23 0504  Mobility  HOB Elevated/Bed Position Self regulated  Activity Ambulated with assistance in hallway;Ambulated with assistance in room;Ambulated with assistance to bathroom  Range of Motion/Exercises Active Assistive;All extremities  Level of Assistance Contact guard assist, steadying assist  Assistive Device BSC;Front wheel walker  Distance Ambulated (ft) 100 ft  Activity Response Tolerated well  Transport method Ambulatory   Brain Cahill, RN

## 2023-11-11 NOTE — Progress Notes (Signed)
 Progress Note   Patient: Clifford Nichols ZOX:[id number] DOB: 18-Apr-1981 DOA: 10/30/2023     11 DOS: the patient was seen and examined on 11/11/2023   Brief hospital course: Clifford Nichols is a 43 y.o with significant PMH of severe OSA, chronic respiraotry failure on 4L, extreme obesity with Alveolar hypoventilation, DM and HTN who presented to the ED with chief complaints of progressive shortness of breath.  He was admitted to stepdown unit on account of acute respiratory failure and required noninvasive positive pressure ventilation.  He failed treatment and hence was intubated for airway protection.  S/p extubation on 4/13//2025.  Clinically has remained stable postextubation.  Requiring CPAP at night.   Assessment and Plan: Acute on chronic hypoxic and Hypercapnic Respiratory Failure Likely multifactorial in the setting of obesity hypoventilation syndrome as well as severe bilateral pneumonia CTA chest negative for PE but consistent with an infectious process. Patient extubated on 11/05/2023. 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 Intermittent usage is insufficient. He will follow pulmonary as outpatient.   Sepsis with septic shock due to suspected pneumonia Staph Hominis bacteremia. Has completed antibiotic course Monitor respiratory function as above   Mildly Elevated Troponin, suspect demand ischemia from respiratory failure. Continue heart failure meds as shown below   Hypokalemia-resolved.  Potassium replacement therapy in place.  Continue to replete and monitor closely   Acute systolic heart failure with EF 35% Cardiomyopathy and pulmonary hypertension. Lasix  as tolerated. Monitor daily weight. Monitor input and  output GDMT as blood pressure allows. High risk for arrhythmias, need life vest upon discharge. Will discuss with cardiology. Continue Farxiga , spironolactone , Entresto .   Acute renal Failure-improved Monitor renal function closely Avoid nephrotoxic agents   Diabetes mellitus type 2 Monitor glucose closely Continue insulin  therapy with sliding scale. Carb consistent diet.   Morbid obesity with BMI 58.97 Counseled on weight loss with diet and exercise when medically stable        Out of bed to chair. Incentive spirometry. Nursing supportive care. Fall, aspiration precautions. Diet:  Diet Orders (From admission, onward)     Start     Ordered   11/08/23 1333  Diet Carb Modified Fluid consistency: Thin; Room service appropriate? Yes  Diet effective now       Comments: Patient may have a zero calorie gatorade daily with lunch tray.  Question Answer Comment  Diet-HS Snack? Nothing   Calorie Level Medium 1600-2000   Fluid consistency: Thin   Room service appropriate? Yes      11/08/23 1332           DVT prophylaxis:   Level of care: Progressive   Code Status: Full Code  Subjective: Patient is seen and examined today morning. The sitting on edge of bed. He is eating fair, getting out of bed with PT. Currently on 4L supplemental o2.  Physical Exam: Vitals:   11/11/23 0855 11/11/23 1133 11/11/23 1406 11/11/23 1559  BP: (!) 148/97 135/62  121/63  Pulse: 100 98  (!) 101  Resp:  19  18  Temp: 98.2 F (36.8 C) 98 F (36.7 C)  98.2 F (36.8 C)  TempSrc: Oral Oral  Oral  SpO2: 96% 99% 97% 91%  Weight:      Height:  General - Lalanne  morbidly obese African American male, mild respiratory distress HEENT - PERRLA, EOMI, atraumatic head, non tender sinuses. Lung - Clear, basal rales, rhonchi, no wheezes. Heart - S1, S2 heard, no murmurs, rubs, 1+ pedal edema. Abdomen - Soft, non tender, obese, bowel sounds good Neuro - Alert, awake and oriented x 3, non focal  exam. Skin - Warm and dry.  Data Reviewed:      Latest Ref Rng & Units 11/10/2023    6:18 AM 11/09/2023    4:35 AM 11/08/2023    4:09 AM  CBC  WBC 4.0 - 10.5 K/uL 10.1  12.3  11.5   Hemoglobin 13.0 - 17.0 g/dL 16.1  09.6  04.5   Hematocrit 39.0 - 52.0 % 49.8  49.4  46.9   Platelets 150 - 400 K/uL 190  200  187       Latest Ref Rng & Units 11/10/2023    6:18 AM 11/09/2023    4:35 AM 11/08/2023    4:09 AM  BMP  Glucose 70 - 99 mg/dL 409  811  914   BUN 6 - 20 mg/dL 19  22  25    Creatinine 0.61 - 1.24 mg/dL 7.82  9.56  2.13   Sodium 135 - 145 mmol/L 140  141  140   Potassium 3.5 - 5.1 mmol/L 3.6  4.0  4.1   Chloride 98 - 111 mmol/L 103  103  106   CO2 22 - 32 mmol/L 31  31  29    Calcium  8.9 - 10.3 mg/dL 8.9  9.1  9.1    No results found.  Family Communication: Discussed with patient, mother  at bedside. They understand and agree. All questions answered.  Disposition: Status is: Inpatient Remains inpatient appropriate because: need CPAP, prior to dc.  Planned Discharge Destination: Home with Home Health     Time spent: 42 minutes  Author: Aisha Hove, MD 11/11/2023 4:05 PM Secure chat 7am to 7pm For on call review [email or web address].

## 2023-11-11 NOTE — Progress Notes (Addendum)
 Pt requested to take a break form BiPAP after using for 2 hours. Pt reported that the face mask is not fitting well and his both eyes are in pain.   Due to Pt preferred to sleep on flat bed with prone position while he was on BiPAP. RN assessed his eyes, and found teary red conjunctival with conjunctival swelling both eyes. Polyvinyl alcohol  ophthalmic eye solution was given. We put him on 4 LPM of NCL and able to maintain SPO2 > 91-93% on side-lying position with head of bed up high > 30 degrees.   RT made aware. No respiratory distress at that time. We continue to monitor.  Brain Cahill, RN

## 2023-11-12 ENCOUNTER — Encounter: Payer: Self-pay | Admitting: Internal Medicine

## 2023-11-12 DIAGNOSIS — G4730 Sleep apnea, unspecified: Secondary | ICD-10-CM

## 2023-11-12 DIAGNOSIS — E662 Morbid (severe) obesity with alveolar hypoventilation: Secondary | ICD-10-CM

## 2023-11-12 DIAGNOSIS — J9601 Acute respiratory failure with hypoxia: Secondary | ICD-10-CM

## 2023-11-12 DIAGNOSIS — G4733 Obstructive sleep apnea (adult) (pediatric): Secondary | ICD-10-CM | POA: Diagnosis not present

## 2023-11-12 DIAGNOSIS — I429 Cardiomyopathy, unspecified: Secondary | ICD-10-CM

## 2023-11-12 DIAGNOSIS — I420 Dilated cardiomyopathy: Secondary | ICD-10-CM

## 2023-11-12 DIAGNOSIS — J9621 Acute and chronic respiratory failure with hypoxia: Secondary | ICD-10-CM | POA: Diagnosis not present

## 2023-11-12 DIAGNOSIS — I5043 Acute on chronic combined systolic (congestive) and diastolic (congestive) heart failure: Secondary | ICD-10-CM

## 2023-11-12 DIAGNOSIS — A419 Sepsis, unspecified organism: Secondary | ICD-10-CM

## 2023-11-12 DIAGNOSIS — I5021 Acute systolic (congestive) heart failure: Secondary | ICD-10-CM | POA: Diagnosis not present

## 2023-11-12 LAB — GLUCOSE, CAPILLARY
Glucose-Capillary: 176 mg/dL — ABNORMAL HIGH (ref 70–99)
Glucose-Capillary: 150 mg/dL — ABNORMAL HIGH (ref 70–99)
Glucose-Capillary: 154 mg/dL — ABNORMAL HIGH (ref 70–99)
Glucose-Capillary: 179 mg/dL — ABNORMAL HIGH (ref 70–99)

## 2023-11-12 MED ORDER — FUROSEMIDE 20 MG PO TABS: 20.0000 mg | ORAL_TABLET | Freq: Every day | ORAL | Status: DC

## 2023-11-12 MED ORDER — ASPIRIN 81 MG PO TBEC
81.0000 mg | DELAYED_RELEASE_TABLET | Freq: Every day | ORAL | Status: DC
  Administered 2023-11-12 – 2023-11-15 (×4): 81 mg via ORAL
  Filled 2023-11-12 (×4): qty 1

## 2023-11-12 MED ORDER — FUROSEMIDE 10 MG/ML IJ SOLN
40.0000 mg | Freq: Two times a day (BID) | INTRAMUSCULAR | Status: DC
  Administered 2023-11-12 – 2023-11-13 (×4): 40 mg via INTRAVENOUS
  Filled 2023-11-12 (×4): qty 4

## 2023-11-12 MED ORDER — METOPROLOL SUCCINATE ER 25 MG PO TB24
25.0000 mg | ORAL_TABLET | Freq: Every day | ORAL | Status: DC
  Administered 2023-11-12 – 2023-11-15 (×4): 25 mg via ORAL
  Filled 2023-11-12 (×4): qty 1

## 2023-11-12 MED ORDER — ATORVASTATIN CALCIUM 20 MG PO TABS
40.0000 mg | ORAL_TABLET | Freq: Every day | ORAL | Status: DC
  Administered 2023-11-12 – 2023-11-15 (×4): 40 mg via ORAL
  Filled 2023-11-12 (×4): qty 2

## 2023-11-12 MED ORDER — ENOXAPARIN SODIUM 100 MG/ML IJ SOSY
85.0000 mg | PREFILLED_SYRINGE | INTRAMUSCULAR | Status: DC
  Administered 2023-11-12 – 2023-11-14 (×3): 85 mg via SUBCUTANEOUS
  Filled 2023-11-12 (×4): qty 1

## 2023-11-12 NOTE — Progress Notes (Signed)
 Progress Note   Patient: Clifford Nichols OZH:[id number] DOB: [birth year] DOA: 10/30/2023     12 DOS: the patient was seen and examined on 11/12/2023   Brief hospital course: Dujuan Volante is a 43 y.o with significant PMH of severe OSA, chronic respiraotry failure on 4L, extreme obesity with Alveolar hypoventilation, DM and HTN who presented to the ED with chief complaints of progressive shortness of breath.  He was admitted to stepdown unit on account of acute respiratory failure and required noninvasive positive pressure ventilation.  He failed treatment and hence was intubated for airway protection.  S/p extubation on 4/13//2025.  Clinically has remained stable postextubation.  Requiring 4l supplemental O2 and CPAP at night.   Assessment and Plan: Acute on chronic hypoxic and Hypercapnic Respiratory Failure Likely multifactorial in the setting of obesity hypoventilation syndrome as well as severe bilateral pneumonia CTA chest negative for PE but consistent with an infectious process. Patient extubated on 11/05/2023. 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 Patient requires frequent durations of ventilatory support. Intermittent usage is insufficient. Awaiting pulmonary paper work for PAP device. He will follow pulmonary as outpatient.   Sepsis with septic shock due to suspected pneumonia Staph Hominis bacteremia. Has completed antibiotic course Monitor respiratory function as above   Mildly Elevated Troponin, suspect demand ischemia from respiratory failure. Continue heart failure meds as shown below   Hypokalemia-resolved.  Potassium replacement therapy as needed.  Continue to replete and monitor closely   Acute systolic heart failure with EF 35% Cardiomyopathy and pulmonary  hypertension. Lasix  as tolerated. Monitor daily weight. Monitor input and output GDMT as blood pressure allows. Cardiology evaluation appreciated, advised IV lasix  therapy. Added beta blocker. Continue Farxiga , spironolactone , Entresto . Heart failure team to be engaged in his care.   Acute renal Failure-improved Monitor renal function closely Avoid nephrotoxic agents   Diabetes mellitus type 2 Monitor glucose closely Continue insulin  therapy with sliding scale. Carb consistent diet.   Morbid obesity with BMI 58.97 Counseled on weight loss with diet and exercise when medically stable Weight reduction strongly encouraged.       Out of bed to chair. Incentive spirometry. Nursing supportive care. Fall, aspiration precautions. Diet:  Diet Orders (From admission, onward)     Start     Ordered   11/13/23 0001  Diet NPO time specified  Diet effective midnight        11/12/23 1006   11/08/23 1333  Diet Carb Modified Fluid consistency: Thin; Room service appropriate? Yes  Diet effective now       Comments: Patient may have a zero calorie gatorade daily with lunch tray.  Question Answer Comment  Diet-HS Snack? Nothing   Calorie Level Medium 1600-2000   Fluid consistency: Thin   Room service appropriate? Yes      11/08/23 1332           DVT prophylaxis:   Level of care: Progressive   Code Status: Full Code  Subjective: Patient is seen and examined today morning. He is lying in bed. He is eating fair, getting out of bed with PT. Currently on 4L supplemental o2. Mother at bedside.  Physical Exam: Vitals:   11/12/23 0557 11/12/23 0815 11/12/23 0821 11/12/23 1153  BP:  (!) 172/90  115/86  Pulse:  98  95  Resp:  19  18  Temp:  98.1 F (36.7 C)  98.6 F (37 C)  TempSrc:  Oral  Oral  SpO2:  90% 92% 90%  Weight: (!) 177 kg     Height:        General - Counts  morbidly obese African American male, mild respiratory distress HEENT - PERRLA, EOMI, atraumatic head, non  tender sinuses. Lung - Clear, basal rales, rhonchi, no wheezes. Heart - S1, S2 heard, no murmurs, rubs, 1+ pedal edema. Abdomen - Soft, non tender, obese, bowel sounds good Neuro - Alert, awake and oriented x 3, non focal exam. Skin - Warm and dry.  Data Reviewed:      Latest Ref Rng & Units 11/10/2023    6:18 AM 11/09/2023    4:35 AM 11/08/2023    4:09 AM  CBC  WBC 4.0 - 10.5 K/uL 10.1  12.3  11.5   Hemoglobin 13.0 - 17.0 g/dL 16.1  09.6  04.5   Hematocrit 39.0 - 52.0 % 49.8  49.4  46.9   Platelets 150 - 400 K/uL 190  200  187       Latest Ref Rng & Units 11/10/2023    6:18 AM 11/09/2023    4:35 AM 11/08/2023    4:09 AM  BMP  Glucose 70 - 99 mg/dL 409  811  914   BUN 6 - 20 mg/dL 19  22  25    Creatinine 0.61 - 1.24 mg/dL 7.82  9.56  2.13   Sodium 135 - 145 mmol/L 140  141  140   Potassium 3.5 - 5.1 mmol/L 3.6  4.0  4.1   Chloride 98 - 111 mmol/L 103  103  106   CO2 22 - 32 mmol/L 31  31  29    Calcium  8.9 - 10.3 mg/dL 8.9  9.1  9.1    No results found.  Family Communication: Discussed with patient, mother  at bedside. They understand and agree. All questions answered.  Disposition: Status is: Inpatient Remains inpatient appropriate because: need CPAP, prior to dc.  Planned Discharge Destination: Home with Home Health     Time spent: 43 minutes  Author: Aisha Hove, MD 11/12/2023 12:16 PM Secure chat 7am to 7pm For on call review [email or web address].

## 2023-11-12 NOTE — Progress Notes (Signed)
 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 Now extubated    PT Comments  Pt ready for session.  RN decreased O2 to 2 lpm today at rest.  Pt stated he has O2 at home that he uses PRN at 4 lpm.   2 lpm at rest 93% Room air at rest and transition to EOB 83% and did not recover at rest.  O2 at 2 lpm for gait.  Decreased to 85% after 100' Increased to 3 lpm for 100' 87% Increased to 4 lpm to recover and back to mid 90's Placed back on  2 lpm at rest per RN   Discussed O2 at home.  Pt continues to struggle with O2 sats and at this point benefits from O2 at rest.  Discussed with pt and mom in room.  Will continue to monitor O2 sats during admission for appropriate O2 needs at home.     If plan is discharge home, recommend the following: A little help with walking and/or transfers;A little help with bathing/dressing/bathroom;Assist for transportation;Help with stairs or ramp for entrance   Can travel by private vehicle        Equipment Recommendations  None recommended by PT    Recommendations for Other Services       Precautions / Restrictions Precautions Precautions: Fall Recall of Precautions/Restrictions: Intact Restrictions Weight Bearing Restrictions Per Provider Order: No     Mobility  Bed Mobility Overal bed mobility: Modified Independent               Patient Response: Cooperative  Transfers Overall transfer level: Modified independent                      Ambulation/Gait Ambulation/Gait assistance: Supervision, Modified independent (Device/Increase time) Gait Distance (Feet): 200  Feet Assistive device: Rolling walker (2 wheels) Gait Pattern/deviations: Step-through pattern Gait velocity: decreased         Stairs             Wheelchair Mobility     Tilt Bed Tilt Bed Patient Response: Cooperative  Modified Rankin (Stroke Patients Only)       Balance Overall balance assessment: Modified Independent                                          Communication Communication Communication: No apparent difficulties  Cognition Arousal: Alert Behavior During Therapy: WFL for tasks assessed/performed   PT - Cognitive impairments: No apparent impairments                         Following commands: Intact      Cueing Cueing Techniques: Verbal cues  Exercises      General Comments        Pertinent Vitals/Pain Pain Assessment Pain Assessment: No/denies pain    Home Living                          Prior Function  PT Goals (current goals can now be found in the care plan section) Progress towards PT goals: Progressing toward goals    Frequency    Min 3X/week      PT Plan      Co-evaluation              AM-PAC PT "6 Clicks" Mobility   Outcome Measure  Help needed turning from your back to your side while in a flat bed without using bedrails?: None Help needed moving from lying on your back to sitting on the side of a flat bed without using bedrails?: None Help needed moving to and from a bed to a chair (including a wheelchair)?: None Help needed standing up from a chair using your arms (e.g., wheelchair or bedside chair)?: None Help needed to walk in hospital room?: A Little Help needed climbing 3-5 steps with a railing? : A Little 6 Click Score: 22    End of Session Equipment Utilized During Treatment: Oxygen Activity Tolerance: Patient tolerated treatment well Patient left: in bed;with call bell/phone within reach;with family/visitor present Nurse Communication:  Mobility status PT Visit Diagnosis: Muscle weakness (generalized) (M62.81);Difficulty in walking, not elsewhere classified (R26.2)     Time: 1040-1055 PT Time Calculation (min) (ACUTE ONLY): 15 min  Charges:    $Gait Training: 8-22 mins PT General Charges $$ ACUTE PT VISIT: 1 Visit                    Charlanne Cong, PTA 11/12/23, 11:13 AM

## 2023-11-12 NOTE — Hospital Course (Signed)
 11/2019  1. Left ventricular ejection fraction, by estimation, is 55 to 60%. The  left ventricle has normal function. The left ventricle has no regional  wall motion abnormalities. There is mild concentric left ventricular  hypertrophy. Left ventricular diastolic  function could not be evaluated.   2. Right ventricular systolic function was not well visualized. The right  ventricular size is moderately enlarged.   3. The mitral valve was not well visualized. No evidence of mitral valve  regurgitation. No evidence of mitral stenosis.   4. The aortic valve is grossly normal. Aortic valve regurgitation is not  visualized. No aortic stenosis is present.  ___  Dc rehab 12/2019 Clifford Nichols is a 43 y.o. male with history of HTN-no meds times couple of months, untreated OSA, morbid obesity--BMI 52.9 who was admitted on 12/02/2019 with acute respiratory failure and ARDS from COVID-19 PNA. He required intubation and was treated with Tocilizumab, remdesivir, Decadron as well as broad-spectrum antibiotics. He was not felt to be a candidate for ECMO due to markedly elevated BMI with patient with proning as well as ketamine. Hospital course significant for septic shock as well as HCAP treated with multiple antibiotics. He continued to have ongoing fevers and positive blood cultures felt to be due to contaminant and antibiotics eventually discontinued by 06/01. He was found to have intermittent left proptosis and CT of head was negative period wife reported this was a chronic problem and patient to follow-up with ophthalmology past discharge.   He did have acute blood loss anemia with thrombocytopenia requiring 2 units PRBCs, required tracheostomy 05/28 and was extubated to ATC. He was briefly downsized to a cuffless trach but changed back to cuffed #6 XLT due to need for TTS vent at night. He was weaned off BiPAP and respiratory status stable on 5 L oxygen.  Stage III sacral decub was being treated with  hydrotherapy. Pain bilateral feet was felt to be due to plantar fasciitis and treated with oxycodone as needed. He is tolerating clears and diet was advanced to regular with thin liquids. He is tolerating PMV trials with improvement in activity tolerance. Therapy ongoing and CIR was recommended due to critical illness polyneuropathy with functional decline.    43 year old male with chronic hypoxic/hypercarbic respiratory failure and severe obstructive sleep apnea who presents for follow-up.   He has severe obstructive sleep apnea, with a sleep study showing 211 apneic events per hour. Various BiPAP machines were trialed during the study to find a comfortable fit, and a suitable option was identified.   He is following up for chronic hypoxic respiratory failure. He did not attend a scheduled pulmonary function test and echocardiogram at the cardiopulmonary department because he was unaware of the appointment. He currently lacks portable oxygen at home and is unsure of the company providing his oxygen, though he thinks recalls the name '[name]'.  He has a concentrator that was given to him after a discharge from the hospital in 2021, he states he has no portable oxygen source.

## 2023-11-12 NOTE — Plan of Care (Signed)
  Problem: Education: Goal: Knowledge of General Education information will improve Description: Including pain rating scale, medication(s)/side effects and non-pharmacologic comfort measures Outcome: Progressing   Problem: Health Behavior/Discharge Planning: Goal: Ability to manage health-related needs will improve Outcome: Progressing   Problem: Clinical Measurements: Goal: Ability to maintain clinical measurements within normal limits will improve Outcome: Progressing Goal: Will remain free from infection Outcome: Progressing Goal: Diagnostic test results will improve Outcome: Progressing Goal: Respiratory complications will improve Outcome: Progressing Goal: Cardiovascular complication will be avoided Outcome: Progressing   Problem: Elimination: Goal: Will not experience complications related to bowel motility Outcome: Progressing Goal: Will not experience complications related to urinary retention Outcome: Progressing   Problem: Pain Managment: Goal: General experience of comfort will improve and/or be controlled Outcome: Progressing   Problem: Safety: Goal: Ability to remain free from injury will improve Outcome: Progressing   Problem: Skin Integrity: Goal: Risk for impaired skin integrity will decrease Outcome: Progressing

## 2023-11-12 NOTE — Plan of Care (Signed)
 Plan of care is reviewed. Pt has been progressing. Pt is alert and fully oriented x 4, afebrile, stable hemodynamically, on 4 LPM of NCL at awakened time. Pt agrees to wear BiPAP tonight. He is able to tolerate BiPAP well. No major complaints overnight. No acute respiratory distress. We will continue to monitor.   Problem: Health Behavior/Discharge Planning: Goal: Ability to manage health-related needs will improve Outcome: Progressing   Problem: Clinical Measurements: Goal: Ability to maintain clinical measurements within normal limits will improve Outcome: Progressing Goal: Will remain free from infection Outcome: Progressing Goal: Diagnostic test results will improve Outcome: Progressing Goal: Respiratory complications will improve Outcome: Progressing Goal: Cardiovascular complication will be avoided Outcome: Progressing   Problem: Activity: Goal: Risk for activity intolerance will decrease Outcome: Progressing   Problem: Elimination: Goal: Will not experience complications related to bowel motility Outcome: Progressing Goal: Will not experience complications related to urinary retention Outcome: Progressing   Problem: Coping: Goal: Level of anxiety will decrease Outcome: Progressing   Problem: Skin Integrity: Goal: Risk for impaired skin integrity will decrease Outcome: Progressing   Problem: Fluid Volume: Goal: Ability to maintain a balanced intake and output will improve Outcome: Progressing   Problem: Metabolic: Goal: Ability to maintain appropriate glucose levels will improve Outcome: Progressing   Brain Cahill, RN

## 2023-11-12 NOTE — Consult Note (Addendum)
 Cardiology Consult    Patient ID: Clifford Nichols MRN: [id number], DOB/AGE: Feb 25, 1981   Admit date: 10/30/2023 Date of Consult: 11/12/2023  Primary Physician: Dionicia Frater, MD Primary Cardiologist: Belva Boyden, MD - new Requesting Provider: N. Butch Cashing, MD  Patient Profile    Clifford Nichols is a 43 y.o. male with a history of morbid obesity, HTN, DMII, chronic hypoxic resp failure on 4lpm PRN @ home, untreated OSA, [name], prior h/o resp failure req intubation in setting of COVID/ARDS (11/2019 - prolonged hospitalization complicated by anemia/thrombocytopenia/shock), who is being seen today for the evaluation of cardiomyopathy and acute HFrEF at the request of Dr. Butch Cashing.  Past Medical History  Subjective  Past Medical History:  Diagnosis Date   Cardiomyopathy (HCC)    Chronic hypoxic respiratory failure (HCC)    a. Uses 4lpm PRN.   HFrEF (heart failure with reduced ejection fraction) (HCC)    a. 11/2019 Echo: EF 55-60%, no rwma, mild conc LVH, mod enlarged RV w/ nl fxn; b. 10/2023 Echo: EF 35-40%, glob HK, mod reduced RV fxn, mod dil, RVSP .   Obesity    Obesity hypoventilation syndrome (HCC)    OSA (obstructive sleep apnea)    Primary hypertension    Respiratory failure (HCC)    a. 11/2019 COVID-19 ->intubation->prolonged hospitalization. Not candidate for ECMO.   Type 2 diabetes mellitus (HCC)     History reviewed. No pertinent surgical history.   Allergies  Not on File    History of Present Illness   43 y.o. male with a history of morbid obesity, HTN, DMII, untreated OSA, [name], chronic hypoxic resp failure on 4lpm PRN @ home, prior h/o resp failure req intubation in setting of COVID/ARDS (11/2019 - prolonged hospitalization complicated by anemia/thrombocytopenia/shock - not ECMO candidate due to body habitus).  He has been using O2 prn since his COVID admission.  He was dx w/ OSA but his CPAP broke, and he has not been using for several years.  He recently  established care w/ Dr. Viva Grise (pulm) and underwent repeat sleep study, which was abnl.  CPAP ordered, but pt hasn't received yet.  Over the past 6 + months, he has noted progressive lower extremity edema.  He has chronic DOE.    On 4/8, he had acute worsening of dypsnea prompting him to call EMS. He was found to be hypoxic w/ sats in the 80's  NRB  CPAP.  In the ED, he cont to sat in the 80's despite CPAP.  He was febrile @ 101.2.  He was intubated for airway protection.  Admission labs and imaging notable for H/H 15.0/54.2, WBC 15.3, D dimer 1.1, BUN/creat 19/1.09, HsTrop 95  70, pH 7.23 w/ PCO2 83, bicarb 34.8.  CTA chest w/o PE but marked bilat lower lob scarring, atx, and/or infiltrate.  He was admitted to ICU and treated w/ azithromycin , cefepime , ceftriaxone , and steroids.  He required norepi until 4/10.  Blood cultures positive for Staphylococcus hominis.  Echocardiogram performed April 8 showed new reduction in EF at 35-40% with global hypokinesis, moderately reduced RV function, and RVSP of 22 mmHg.   He received lasix  on 4/8 and again on 4/12, with daily acetazolamide  inbetween.  Creat up to 2.35 on 4/9 w/ subsequent improvement.   He was extubated on 4/13, and initially transitioned to high flow nasal cannula and subsequently nasal cannula at 4 L/min.  Following transfer to floor, patient has been seen by seen by CHF pharmacist and spiro, entresto , and farxiga  added.  We have been asked to evaluate in the setting of LV dysfunction.  Patient is currently lying flat on 4 L nasal cannula with his mother at his bedside.  He has no complaints this morning.  Inpatient Medications  Subjective    budesonide  (PULMICORT ) nebulizer solution  0.25 mg Nebulization BID   Chlorhexidine  Gluconate Cloth  6 each Topical Daily   dapagliflozin  propanediol  10 mg Oral Daily   enoxaparin  (LOVENOX ) injection  85 mg Subcutaneous Q24H   insulin  aspart  0-20 Units Subcutaneous TID WC   insulin  aspart  0-5 Units  Subcutaneous QHS   insulin  glargine-yfgn  20 Units Subcutaneous BID   ipratropium-albuterol   3 mL Nebulization TID   multivitamin with minerals  1 tablet Oral Daily   mouth rinse  15 mL Mouth Rinse 4 times per day   predniSONE   20 mg Oral Q breakfast   Followed by   Cecily Cohen ON 11/14/2023] predniSONE   10 mg Oral Q breakfast   Ensure Max Protein  11 oz Oral TID   sacubitril -valsartan   1 tablet Oral BID   sodium chloride  flush  3 mL Intravenous Q12H   spironolactone   12.5 mg Oral Daily    Family History    History reviewed. No pertinent family history. He indicated that his mother is alive. No premature CAD.  Social History    Social History   Socioeconomic History   Marital status: Married    Spouse name: Thomas Fleischer   Number of children: 3   Years of education: Not on file   Highest education level: 12th grade  Occupational History   Occupation: Disability  Tobacco Use   Smoking status: Never    Passive exposure: Never   Smokeless tobacco: Never  Vaping Use   Vaping status: Never Used  Substance and Sexual Activity   Alcohol  use: Never   Drug use: Never   Sexual activity: Not on file  Other Topics Concern   Not on file  Social History Narrative   Lives locally with mother.  Frequently eats processed and fast foods.   Social Drivers of Corporate investment banker Strain: High Risk (11/08/2023)   Overall Financial Resource Strain (CARDIA)    Difficulty of Paying Living Expenses: Hard  Food Insecurity: Food Insecurity Present (11/08/2023)   Hunger Vital Sign    Worried About Running Out of Food in the Last Year: Sometimes true    Ran Out of Food in the Last Year: Sometimes true  Transportation Needs: Unmet Transportation Needs (11/08/2023)   PRAPARE - Administrator, Civil Service (Medical): Yes    Lack of Transportation (Non-Medical): Yes  Physical Activity: Not on file  Stress: Not on file  Social Connections: Not on file  Intimate Partner Violence: Not  At Risk (10/31/2023)   Humiliation, Afraid, Rape, and Kick questionnaire    Fear of Current or Ex-Partner: No    Emotionally Abused: No    Physically Abused: No    Sexually Abused: No     Review of Systems    General:  No chills, fever, night sweats or weight changes.  Cardiovascular:  No chest pain, +++ dyspnea on exertion, +++ LE edema, no orthopnea, palpitations, paroxysmal nocturnal dyspnea. Dermatological: No rash, lesions/masses Respiratory: No cough, +++ dyspnea Urologic: No hematuria, dysuria Abdominal:   +++ inc abd girth.  No nausea, vomiting, diarrhea, bright red blood per rectum, melena, or hematemesis Neurologic:  No visual changes, wkns, changes in mental status. All other systems reviewed and  are otherwise negative except as noted above.    Objective  Physical Exam    Blood pressure (!) 172/90, pulse 98, temperature 98.1 F (36.7 C), temperature source Oral, resp. rate 19, height 5\' 9"  (1.753 m), weight (!) 177 kg, SpO2 92%.  General: Pleasant, NAD Psych: Normal affect. Neuro: Alert and oriented X 3. Moves all extremities spontaneously. HEENT: Normal  Neck: Supple, obese, difficult to gauge JVP.  No bruits. Lungs:  Resp regular and unlabored, diminished breath sounds bilaterally. Heart: RRR, distant, no s3, s4, or murmurs. Abdomen: Obese, soft, non-tender, non-distended, BS + x 4.  Extremities: No clubbing, cyanosis.  1+ bilateral lower extremity edema. DP/PT2+, Radials 2+ and equal bilaterally.  Labs    Cardiac Enzymes Recent Labs  Lab 10/30/23 2348 10/31/23 0139  TROPONINIHS 95* 70*     BNP    Component Value Date/Time   BNP 129.1 (H) 10/31/2023 2030    Lab Results  Component Value Date   WBC 10.1 11/10/2023   HGB 13.7 11/10/2023   HCT 49.8 11/10/2023   MCV 94.9 11/10/2023   PLT 190 11/10/2023    Recent Labs  Lab 11/10/23 0618  NA 140  K 3.6  CL 103  CO2 31  BUN 19  CREATININE 0.71  CALCIUM  8.9  GLUCOSE 134*   Lab Results  Component  Value Date   DDIMER 1.10 (H) 10/30/2023      Radiology Studies    DG Chest Port 1 View Result Date: 11/04/2023 CLINICAL DATA:  Respiratory failure. EXAM: PORTABLE CHEST 1 VIEW COMPARISON:  11/01/2023 FINDINGS: Low volume under penetrated film. The cardio pericardial silhouette is enlarged. Endotracheal tube tip is approximately 4.6 cm above the base of the carina. NG tube cannot be visualized below the level of the carina due to technique on this study. Right PICC line tip projects in the region of the low right innominate vein. Vascular congestion noted with probable retrocardiac collapse/consolidation. No substantial pleural effusion. IMPRESSION: 1. Low volume under penetrated film with vascular congestion and probable retrocardiac collapse/consolidation. 2. Endotracheal tube tip is approximately 4.6 cm above the base of the carina. 3. NG tube cannot be visualized below the mid to lower mediastinum and may be in the distal esophagus. Repeat chest x-ray with increased penetration may prove helpful to further evaluate. Abdominal film could also be used to assess for NG tube placement. 4. Right PICC line tip projects in the region of the low right innominate vein. Electronically Signed   By: Donnal Fusi M.D.   On: 11/04/2023 08:43   DG Chest Port 1 View Result Date: 11/01/2023 CLINICAL DATA:  Status post PICC line placement. EXAM: PORTABLE CHEST 1 VIEW COMPARISON:  10/31/2023 FINDINGS: The endotracheal tube tip is approximately 4.5 cm above the carina. There is a new right IJ catheter with tip in the projection of the SVC. No pneumothorax identified. No PICC line identified. Enteric tube tip courses below the level of the GE junction. Cardiac enlargement. Atelectasis/consolidation is again noted in both lung bases. IMPRESSION: 1. New right IJ catheter with tip in the projection of the SVC. No pneumothorax. 2. No PICC line identified. 3. Persistent bibasilar atelectasis/consolidation. Electronically Signed    By: Kimberley Penman M.D.   On: 11/01/2023 06:55   DG Chest Port 1 View Result Date: 10/31/2023 CLINICAL DATA:  Endotracheal tube present. EXAM: PORTABLE CHEST 1 VIEW COMPARISON:  Earlier today FINDINGS: Endotracheal tube tip is at the level of the clavicular heads approximately 5.4 cm from the carina.  Lung volumes are low with increasing bibasilar volume loss. Enteric tube is faintly visualized to the level of the lower chest. Cardiomegaly is grossly unchanged. IMPRESSION: 1. Endotracheal tube tip at the level of the clavicular heads approximately 5.4 cm from the carina. 2. Low lung volumes with increasing bibasilar volume loss. Electronically Signed   By: Chadwick Colonel M.D.   On: 10/31/2023 18:31   CT Angio Chest PE W and/or Wo Contrast Result Date: 10/31/2023 CLINICAL DATA:  Shortness of breath. EXAM: CT ANGIOGRAPHY CHEST WITH CONTRAST TECHNIQUE: Multidetector CT imaging of the chest was performed using the standard protocol during bolus administration of intravenous contrast. Multiplanar CT image reconstructions and MIPs were obtained to evaluate the vascular anatomy. RADIATION DOSE REDUCTION: This exam was performed according to the departmental dose-optimization program which includes automated exposure control, adjustment of the mA and/or kV according to patient size and/or use of iterative reconstruction technique. CONTRAST:  OMNIPAQUE  IOHEXOL  350 MG/ML SOLN COMPARISON:  April 26, 2018 FINDINGS: Cardiovascular: The thoracic aorta is unremarkable. The pulmonary arteries are limited in evaluation secondary to suboptimal opacification with intravenous contrast. This study is also limited secondary to the patient's large body habitus and subsequent beam hardening artifact. No evidence of pulmonary embolism. Normal heart size. No pericardial effusion. Mediastinum/Nodes: Properly positioned endotracheal and nasogastric tubes are seen. No enlarged mediastinal, hilar, or axillary lymph nodes. Thyroid  gland, trachea, and esophagus demonstrate no significant findings. Lungs/Pleura: Marked severity areas of scarring, atelectasis and/or infiltrate are seen within the posteromedial aspect of the bilateral lower lobes. Mild right middle lobe atelectatic changes are also present. No pleural effusion or pneumothorax is identified. Upper Abdomen: No acute abnormality. Musculoskeletal: No chest wall abnormality. No acute or significant osseous findings. Review of the MIP images confirms the above findings. IMPRESSION: 1. Limited study without evidence of pulmonary embolism. 2. Marked severity areas of bilateral lower lobe scarring, atelectasis and/or infiltrate. 3. Mild right middle lobe atelectatic changes. 4. Properly positioned endotracheal and nasogastric tubes. Electronically Signed   By: Virgle Grime M.D.   On: 10/31/2023 03:58   DG Abdomen 1 View Result Date: 10/31/2023 CLINICAL DATA:  Orogastric tube placement. EXAM: ABDOMEN - 1 VIEW COMPARISON:  Dec 13, 2019 FINDINGS: There is stable endotracheal tube positioning. An enteric tube is noted with its distal end overlying the body of the stomach. The distal side hole sits approximately 7.8 cm from the gastroesophageal junction. The bowel gas pattern is normal. No radio-opaque calculi or other significant radiographic abnormality are seen. IMPRESSION: Enteric tube positioning, as described above. Electronically Signed   By: Virgle Grime M.D.   On: 10/31/2023 01:20   DG Chest Portable 1 View Result Date: 10/31/2023 CLINICAL DATA:  Status post intubation EXAM: PORTABLE CHEST 1 VIEW COMPARISON:  October 30, 2023 FINDINGS: Since the prior study there is been interval placement of an endotracheal tube with its distal tip approximately 3.8 cm from the carina. The cardiac silhouette is enlarged and unchanged in size. Low lung volumes are seen with mild left perihilar, left suprahilar and right basilar atelectasis and/or infiltrate. No pleural effusion or  pneumothorax is identified. The visualized skeletal structures are unremarkable. IMPRESSION: 1. Interval endotracheal tube placement, as described above. 2. Low lung volumes with mild left perihilar, left suprahilar and right basilar atelectasis and/or infiltrate. Electronically Signed   By: Virgle Grime M.D.   On: 10/31/2023 01:16   DG Chest Portable 1 View Result Date: 10/31/2023 CLINICAL DATA:  Respiratory distress. EXAM: PORTABLE CHEST 1  VIEW COMPARISON:  April 23, 2020 FINDINGS: The cardiac silhouette is enlarged. This may be, in part, technical in origin. Low lung volumes are seen with mild prominence of the central pulmonary vasculature. Prominence of the bronchovascular lung markings is also noted. Mild atelectasis and/or infiltrate is suspected within the right lung base. No pleural effusion or pneumothorax is identified. The visualized skeletal structures are unremarkable. IMPRESSION: Low lung volumes with mild right basilar atelectasis and/or infiltrate. Electronically Signed   By: Virgle Grime M.D.   On: 10/31/2023 01:14      ECG & Cardiac Imaging    4/7 - ST, 122, LAD, antlat infarct. - personally reviewed. 4/1  - RSR 73, LAD, antlat infarct - personally reviewed  Assessment & Plan    1.  Acute on chronic hypoxic and hypercapnic respiratory failure/bilateral pneumonia: Patient with history of chronic respiratory failure and no obesity hypoventilation syndrome.  He was using 4 L/min as needed at home.  Admitted April 7 with acute worsening of dyspnea and hypoxia.  Found to have pneumonia as well as Staph hominis bacteremia and shock requiring norepinephrine .  He was extubated on the 13th and is currently on nasal cannula.  He completed courses of antibiotics and remains on steroid and nebulizer therapy per medicine team.  2.  Acute heart failure with reduced ejection fraction: EF 35 to 40% with global hypokinesis, moderately reduced RV function, and RVSP of 22 mmHg by echo on  April 8.  He did receive diuretic therapy for 5 days but has been off since the 12th.  He notes that in the outpatient setting, he had progressive lower extremity swelling over about a 34-month period.  He was previously referred to see Dr. Mitzie Anda in the outpatient heart failure clinic but no-showed on April 3.  Since extubation, he has been seen by the heart failure pharmacist and Entresto , Farxiga , and spironolactone  added.  Blood pressures have been variable but there is room to add beta-blocker therapy and I will start Toprol -XL 25 mg daily in the setting of known lung disease.  Ongoing lower extremity edema.  Will add Lasix  20 mg orally.  We will enlist our advanced heart failure team to see him tomorrow.  We discussed likely right and left heart diagnostic catheterization.  Will keep n.p.o. after midnight.  3.  Primary hypertension: As above, pressures variable.  172/90 this morning but sometimes as low as 120s.  He was on amlodipine 10 mg and lisinopril 20 mg daily at home.  Currently on Entresto  24-26 mg twice daily and spironolactone  12.5 mg daily.  Adding Toprol -XL.  Follow in continue to look for opportunities to titrate heart failure GDMT.  4.  Demand ischemia: Troponin mildly elevated at 95 on arrival with subsequent value of 70.  No prior history of chest pain.  EF of 35 to 40% by echo with global hypokinesis this admission.  Suspect demand ischemia in the setting of respiratory failure and shock.  No prior history of chest pain.  No significant coronary calcium  noted on limited views on CTA.  Will need ischemic evaluation in the setting of new diagnosis of systolic heart failure.  See #1.  Mild LFT elevation on admission in setting of shock.  Will follow-up and add statin.  Follow-up lipids.  Add aspirin  for now.  Adding beta-blocker as outlined above.  5.  Obstructive sleep apnea: In process of getting outpatient CPAP.  6.  Type 2 diabetes mellitus: A1c 6.8.  Insulin  per medicine team.  On  SGLT2 inhibitor.  Adding statin.  Follow-up lipids.  On ARB in the setting of Entresto .  7.  Staph hominis bacteremia: Status post antibiotic therapy.  Nontoxic at this time.  8.  Acute kidney injury: In setting of shock.  Resolved.  Creatinine 0.71 on the 18th.  9.  Morbid obesity: Will benefit from outpatient cardiac rehabilitation and likely a GLP-1 agonist.  Risk Assessment/Risk Scores:        New [place]  Heart Association (NYHA) Functional Class NYHA Class III    Signed, Laneta Pintos, NP 11/12/2023, 9:50 AM  For questions or updates, please contact   Please consult [email or web address] for contact info under Cardiology/STEMI.

## 2023-11-13 ENCOUNTER — Encounter: Payer: Self-pay | Admitting: Internal Medicine

## 2023-11-13 ENCOUNTER — Other Ambulatory Visit (HOSPITAL_COMMUNITY): Payer: Self-pay

## 2023-11-13 ENCOUNTER — Telehealth: Payer: Self-pay

## 2023-11-13 ENCOUNTER — Encounter: Admitting: Family

## 2023-11-13 ENCOUNTER — Telehealth (HOSPITAL_BASED_OUTPATIENT_CLINIC_OR_DEPARTMENT_OTHER): Payer: Self-pay

## 2023-11-13 DIAGNOSIS — G4733 Obstructive sleep apnea (adult) (pediatric): Secondary | ICD-10-CM | POA: Diagnosis not present

## 2023-11-13 DIAGNOSIS — I5021 Acute systolic (congestive) heart failure: Secondary | ICD-10-CM | POA: Diagnosis not present

## 2023-11-13 DIAGNOSIS — J9621 Acute and chronic respiratory failure with hypoxia: Secondary | ICD-10-CM | POA: Diagnosis not present

## 2023-11-13 DIAGNOSIS — J9611 Chronic respiratory failure with hypoxia: Secondary | ICD-10-CM

## 2023-11-13 LAB — GLUCOSE, CAPILLARY
Glucose-Capillary: 165 mg/dL — ABNORMAL HIGH (ref 70–99)
Glucose-Capillary: 131 mg/dL — ABNORMAL HIGH (ref 70–99)
Glucose-Capillary: 200 mg/dL — ABNORMAL HIGH (ref 70–99)
Glucose-Capillary: 133 mg/dL — ABNORMAL HIGH (ref 70–99)

## 2023-11-13 LAB — COMPREHENSIVE METABOLIC PANEL WITH GFR
ALT: 230 U/L — ABNORMAL HIGH (ref 0–44)
AST: 106 U/L — ABNORMAL HIGH (ref 15–41)
Albumin: 3.2 g/dL — ABNORMAL LOW (ref 3.5–5.0)
Alkaline Phosphatase: 43 U/L (ref 38–126)
Anion gap: 12 (ref 5–15)
BUN: 18 mg/dL (ref 6–20)
CO2: 33 mmol/L — ABNORMAL HIGH (ref 22–32)
Calcium: 9 mg/dL (ref 8.9–10.3)
Chloride: 97 mmol/L — ABNORMAL LOW (ref 98–111)
Creatinine, Ser: 0.87 mg/dL (ref 0.61–1.24)
GFR, Estimated: >60 mL/min (ref >60)
Glucose, Bld: 140 mg/dL — ABNORMAL HIGH (ref 70–99)
Potassium: 3.3 mmol/L — ABNORMAL LOW (ref 3.5–5.1)
Sodium: 142 mmol/L (ref 135–145)
Total Bilirubin: 0.6 mg/dL (ref 0.0–1.2)
Total Protein: 6.9 g/dL (ref 6.5–8.1)

## 2023-11-13 LAB — LIPID PANEL
Cholesterol: 213 mg/dL — ABNORMAL HIGH (ref 0–200)
HDL: 45 mg/dL (ref >40)
LDL Cholesterol: 140 mg/dL — ABNORMAL HIGH (ref 0–99)
Total CHOL/HDL Ratio: 4.7 ratio
Triglycerides: 138 mg/dL (ref <150)
VLDL: 28 mg/dL (ref 0–40)

## 2023-11-13 MED ORDER — SPIRONOLACTONE 25 MG PO TABS
25.0000 mg | ORAL_TABLET | Freq: Every day | ORAL | Status: DC
  Administered 2023-11-14 – 2023-11-15 (×2): 25 mg via ORAL
  Filled 2023-11-13 (×3): qty 1

## 2023-11-13 MED ORDER — SODIUM CHLORIDE 0.9 % IV SOLN: INTRAVENOUS | Status: DC

## 2023-11-13 NOTE — Addendum Note (Signed)
 Addended by: Pepper Boyer on: 11/13/2023 04:10 PM   Modules accepted: Orders

## 2023-11-13 NOTE — Progress Notes (Signed)
 Nutrition Follow-up  DOCUMENTATION CODES:   Morbid obesity  INTERVENTION:   -MVI with minerals daily -D/c Ensure Max -Double protein portions with meals -RD provided "Heart Healthy, Consistent Carbohydrate Nutrition Therapy" handout from [facility] Nutrition Care Manual; attached to AVS/ discharge summary -RD referral pt to [facility] for further support and reinforcement  NUTRITION DIAGNOSIS:   Inadequate oral intake related to inability to eat (pt sedated and ventilatd) as evidenced by NPO status.  Progressing; advanced to PO diet on 11/05/23  GOAL:   Patient will meet greater than or equal to 90% of their needs  Progressing   MONITOR:   PO intake, Supplement acceptance, Labs, Weight trends, I & O's, Skin  REASON FOR ASSESSMENT:   Consult Enteral/tube feeding initiation and management  ASSESSMENT:   43 y/o male with h/o OSA, chronic respiraotry failure on 4L, extreme obesity with alveolar hypoventilation, DM, HTN and a lengthy admission for COVID 19/ARDS requiring tracheostmy/cortrak tube 2021 and who is now admitted with CAP, sepsis and AKI.  Reviewed I/O's: -1.3 L x 24 hours and -6.9 L since admission  UOP: 2.6 L x 24 hours  Pt NPO today for heart catheterization.   Pt receiving personal care at time of visit.   Pt previously on carb modified diet. Noted meal completions 95-100%. Pt has been refusing Ensure Max supplements.   Noted concern from MD and cardiology regarding obesity. Obesity is a complex, chronic medical condition that is optimally managed by a multidisciplinary care team. Weight loss is not an ideal goal for an acute inpatient hospitalization. However, if further work-up for obesity is warranted, consider outpatient referral to [facility].    Per TOC notes, awaiting for CPAP/ Bipap approval for home.   Medications reviewed and include farxiga , lovenox , lasix ,  prednisoine, and aldactone .   Labs reviewed: K: 3.3, CBGS: 142-219 (inpatient orders for glycemic control are 0-20 units insulin  aspart TID with meals, 0-5 units insulin  aspart daily at bedtime, and 20 units insulin  glargine-yfgn BID).    Diet Order:   Diet Order             Diet NPO time specified  Diet effective midnight                   EDUCATION NEEDS:   No education needs have been identified at this time  Skin:  Skin Assessment: Reviewed RN Assessment  Last BM:  11/12/23 (type 6)  Height:   Ht Readings from Last 1 Encounters:  10/31/23 5\' 9"  (1.753 m)    Weight:   Wt Readings from Last 1 Encounters:  11/13/23 (!) 177.7 kg    Ideal Body Weight:  72.7 kg  BMI:  Body mass index is 57.84 kg/m.  Estimated Nutritional Needs:   Kcal:  2200-2400  Protein:  130-145 grams  Fluid:  > 2 L    Herschel Lords, RD, LDN, CDCES Registered Dietitian III Certified Diabetes Care and Education Specialist If unable to reach this RD, please use "RD Inpatient" group chat on secure chat between hours of 8am-4 pm daily

## 2023-11-13 NOTE — Progress Notes (Signed)
 Dr. Sreeram gave order to hold morning dose of semglee  insulin  since patient is NPO for possible procedure.

## 2023-11-13 NOTE — Progress Notes (Signed)
 VAST consult received to obtain IV access for cardiac cath for tomorrow. Contacted pt's nurse via Special educational needs teacher and educated that IV should be placed closer to procedure time to allow for vein preservation as well as to ensure working access immediately before procedure. Unit RN to place new consult closer to cardiac cath procedure.

## 2023-11-13 NOTE — Progress Notes (Signed)
 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 Now extubated    PT Comments  OOB and completes x 1 lap with RW and O2 at 2 lpm.  He is able to maintain sats >90 today on 2 lpm.     If plan is discharge home, recommend the following: Assist for transportation;Help with stairs or ramp for entrance   Can travel by private vehicle        Equipment Recommendations  None recommended by PT    Recommendations for Other Services       Precautions / Restrictions Precautions Precautions: Fall Recall of Precautions/Restrictions: Intact Restrictions Weight Bearing Restrictions Per Provider Order: No     Mobility  Bed Mobility Overal bed mobility: Modified Independent               Patient Response: Cooperative  Transfers Overall transfer level: Modified independent                      Ambulation/Gait Ambulation/Gait assistance: Modified independent (Device/Increase time) Gait Distance (Feet): 200 Feet Assistive device: Rolling walker (2 wheels) Gait Pattern/deviations: Step-through pattern Gait velocity: decreased     General Gait Details: able to maintain sats on 2 lpm today   Stairs             Wheelchair Mobility     Tilt Bed Tilt Bed Patient Response: Cooperative  Modified Rankin (Stroke Patients Only)       Balance Overall balance assessment: Modified Independent                                          Communication Communication Communication: No apparent difficulties  Cognition Arousal: Alert Behavior During Therapy: WFL for tasks assessed/performed   PT -  Cognitive impairments: No apparent impairments                         Following commands: Intact      Cueing Cueing Techniques: Verbal cues  Exercises      General Comments        Pertinent Vitals/Pain Pain Assessment Pain Assessment: No/denies pain    Home Living                          Prior Function            PT Goals (current goals can now be found in the care plan section) Progress towards PT goals: Progressing toward goals    Frequency    Min 3X/week      PT Plan      Co-evaluation              AM-PAC PT "6 Clicks" Mobility   Outcome Measure  Help needed turning from your back to your side while in a flat bed without using bedrails?: None Help needed moving from lying on your back to sitting on the side of a flat bed without using bedrails?: None Help needed moving to and from a bed to a chair (  including a wheelchair)?: None Help needed standing up from a chair using your arms (e.g., wheelchair or bedside chair)?: None Help needed to walk in hospital room?: None Help needed climbing 3-5 steps with a railing? : A Little 6 Click Score: 23    End of Session Equipment Utilized During Treatment: Oxygen Activity Tolerance: Patient tolerated treatment well Patient left: in bed;with call bell/phone within reach;with family/visitor present Nurse Communication: Mobility status PT Visit Diagnosis: Muscle weakness (generalized) (M62.81);Difficulty in walking, not elsewhere classified (R26.2)     Time: 1610-9604 PT Time Calculation (min) (ACUTE ONLY): 10 min  Charges:    $Gait Training: 8-22 mins PT General Charges $$ ACUTE PT VISIT: 1 Visit                   Charlanne Cong, PTA 11/13/23, 10:31 AM

## 2023-11-13 NOTE — Plan of Care (Signed)
 Plan of care is reviewed. Pt has been progressing. Pt is alert and fully oriented x 4, afebrile, stable hemodynamically, on 2 LPM of NCL at awakened time. SPO2 97-100%. He agrees on BiPAP at night time. Able to tolerate BiPAP very well. No major complaints overnight. No acute respiratory distress noted. We will continue to monitor.    Problem: Health Behavior/Discharge Planning: Goal: Ability to manage health-related needs will improve Outcome: Progressing   Problem: Clinical Measurements: Goal: Ability to maintain clinical measurements within normal limits will improve Outcome: Progressing Goal: Will remain free from infection Outcome: Progressing Goal: Diagnostic test results will improve Outcome: Progressing Goal: Respiratory complications will improve Outcome: Progressing Goal: Cardiovascular complication will be avoided Outcome: Progressing   Problem: Activity: Goal: Risk for activity intolerance will decrease Outcome: Progressing   Problem: Elimination: Goal: Will not experience complications related to bowel motility Outcome: Progressing Goal: Will not experience complications related to urinary retention Outcome: Progressing   Problem: Coping: Goal: Level of anxiety will decrease Outcome: Progressing   Problem: Skin Integrity: Goal: Risk for impaired skin integrity will decrease Outcome: Progressing   Problem: Fluid Volume: Goal: Ability to maintain a balanced intake and output will improve Outcome: Progressing   Problem: Metabolic: Goal: Ability to maintain appropriate glucose levels will improve Outcome: Progressing    Brain Cahill, RN

## 2023-11-13 NOTE — Care Management Important Message (Signed)
 Important Message  Patient Details  Name: Clifford Nichols MRN: [id number] Date of Birth: 11-Jan-1981   Important Message Given:  Yes - Medicare IM     [name] 11/13/2023, 1:06 PM

## 2023-11-13 NOTE — Plan of Care (Signed)
 The patient remains on AR-2A as of time of writing. The patient is requiring 2 L / min of supplemental O2 via [place]. Plan for heart catheterization tomorrow.   Problem: Education: Goal: Knowledge of General Education information will improve Description: Including pain rating scale, medication(s)/side effects and non-pharmacologic comfort measures Outcome: Progressing   Problem: Health Behavior/Discharge Planning: Goal: Ability to manage health-related needs will improve Outcome: Progressing   Problem: Clinical Measurements: Goal: Ability to maintain clinical measurements within normal limits will improve Outcome: Progressing Goal: Will remain free from infection Outcome: Progressing Goal: Diagnostic test results will improve Outcome: Progressing Goal: Respiratory complications will improve Outcome: Progressing Goal: Cardiovascular complication will be avoided Outcome: Progressing   Problem: Activity: Goal: Risk for activity intolerance will decrease Outcome: Progressing   Problem: Nutrition: Goal: Adequate nutrition will be maintained Outcome: Progressing   Problem: Coping: Goal: Level of anxiety will decrease Outcome: Progressing   Problem: Elimination: Goal: Will not experience complications related to bowel motility Outcome: Progressing Goal: Will not experience complications related to urinary retention Outcome: Progressing   Problem: Pain Managment: Goal: General experience of comfort will improve and/or be controlled Outcome: Progressing   Problem: Safety: Goal: Ability to remain free from injury will improve Outcome: Progressing   Problem: Skin Integrity: Goal: Risk for impaired skin integrity will decrease Outcome: Progressing   Problem: Education: Goal: Ability to describe self-care measures that may prevent or decrease complications (Diabetes Survival Skills Education) will improve Outcome: Progressing Goal: Individualized Educational  Video(s) Outcome: Progressing   Problem: Coping: Goal: Ability to adjust to condition or change in health will improve Outcome: Progressing   Problem: Fluid Volume: Goal: Ability to maintain a balanced intake and output will improve Outcome: Progressing   Problem: Health Behavior/Discharge Planning: Goal: Ability to identify and utilize available resources and services will improve Outcome: Progressing Goal: Ability to manage health-related needs will improve Outcome: Progressing   Problem: Metabolic: Goal: Ability to maintain appropriate glucose levels will improve Outcome: Progressing   Problem: Nutritional: Goal: Maintenance of adequate nutrition will improve Outcome: Progressing Goal: Progress toward achieving an optimal weight will improve Outcome: Progressing   Problem: Skin Integrity: Goal: Risk for impaired skin integrity will decrease Outcome: Progressing   Problem: Tissue Perfusion: Goal: Adequacy of tissue perfusion will improve Outcome: Progressing   Problem: Activity: Goal: Ability to tolerate increased activity will improve Outcome: Progressing   Problem: Respiratory: Goal: Ability to maintain a clear airway and adequate ventilation will improve Outcome: Progressing   Problem: Role Relationship: Goal: Method of communication will improve Outcome: Progressing   Problem: Education: Goal: Ability to demonstrate management of disease process will improve Outcome: Progressing Goal: Ability to verbalize understanding of medication therapies will improve Outcome: Progressing Goal: Individualized Educational Video(s) Outcome: Progressing   Problem: Activity: Goal: Capacity to carry out activities will improve Outcome: Progressing   Problem: Cardiac: Goal: Ability to achieve and maintain adequate cardiopulmonary perfusion will improve Outcome: Progressing   Problem: Education: Goal: Understanding of CV disease, CV risk reduction, and recovery  process will improve Outcome: Progressing Goal: Individualized Educational Video(s) Outcome: Progressing   Problem: Activity: Goal: Ability to return to baseline activity level will improve Outcome: Progressing   Problem: Cardiovascular: Goal: Ability to achieve and maintain adequate cardiovascular perfusion will improve Outcome: Progressing Goal: Vascular access site(s) Level 0-1 will be maintained Outcome: Progressing   Problem: Health Behavior/Discharge Planning: Goal: Ability to safely manage health-related needs after discharge will improve Outcome: Progressing

## 2023-11-13 NOTE — Progress Notes (Signed)
 Progress Note   Patient: Clifford Nichols RUE:[id number] DOB: [birth year] DOA: 10/30/2023     13 DOS: the patient was seen and examined on 11/13/2023   Brief hospital course: Clifford Nichols is a 43 y.o with significant PMH of severe OSA, chronic respiraotry failure on 4L, extreme obesity with Alveolar hypoventilation, DM and HTN who presented to the ED with chief complaints of progressive shortness of breath.  He was admitted to stepdown unit on account of acute respiratory failure and required noninvasive positive pressure ventilation.  He failed treatment and hence was intubated for airway protection.  S/p extubation on 4/13//2025.  Clinically has remained stable postextubation.  Requiring 4l supplemental O2 and CPAP at night.   Assessment and Plan: Acute on chronic hypoxic and Hypercapnic Respiratory Failure Likely multifactorial in the setting of obesity hypoventilation syndrome, heart failure as well as severe bilateral pneumonia CTA chest negative for PE but consistent with an infectious process. Patient extubated on 11/05/2023. According to patient he uses 4 L of oxygen at home Patient has completed antibiotic course Continue prednisone  taper. Cardiology plan to do right and left heart cath tomorrow.   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 Patient requires frequent durations of ventilatory support. Intermittent usage is insufficient. Awaiting pulmonary paper work for PAP device. He will follow pulmonary as outpatient.   Sepsis with septic shock due to suspected pneumonia Staph Hominis bacteremia. Has completed antibiotic course Monitor respiratory function as above   Mildly Elevated Troponin, suspect demand ischemia from respiratory failure. Continue heart failure meds as shown below.   Hypokalemia-resolved.  Potassium replacement therapy as needed.  Continue to replete and monitor closely    Acute systolic heart failure with EF 35% Cardiomyopathy and pulmonary hypertension. Monitor daily weight. Monitor input and output GDMT as blood pressure allows. Cardiology evaluation appreciated, advised IV lasix  therapy. Added beta blocker. Continue Farxiga , spironolactone , Entresto .  Heart failure team advised right and left heart cath which is planned for tomorrow.   Acute renal Failure-improved Monitor renal function closely Avoid nephrotoxic agents   Diabetes mellitus type 2 Monitor glucose closely Continue insulin  therapy with sliding scale. Carb consistent diet.   Morbid obesity with BMI 58.97 Counseled on weight loss with diet and exercise when medically stable Weight reduction strongly encouraged.       Out of bed to chair. Incentive spirometry. Nursing supportive care. Fall, aspiration precautions. Diet:  Diet Orders (From admission, onward)     Start     Ordered   11/14/23 0001  Diet NPO time specified Except for: Sips with Meds  Diet effective midnight       Comments: NPO for solid foods after midnight, may have clear liquids until 5am, then NPO (this would be for inpatients and outpatients)  Question:  Except for  Answer:  Sips with Meds   11/13/23 1249   11/13/23 1205  Diet heart healthy/carb modified Room service appropriate? Yes; Fluid consistency: Thin  Diet effective now       Question Answer Comment  Diet-HS Snack? Nothing   Room service appropriate? Yes   Fluid consistency: Thin      11/13/23 1204           DVT prophylaxis:   Level of care: Progressive   Code Status: Full Code  Subjective: Patient is seen and examined today morning. He is lying in bed. Eating fair. Currently on 4L supplemental o2. Mother at bedside.  Physical Exam: Vitals:   11/13/23 0741 11/13/23 0956 11/13/23 1228 11/13/23 1542  BP:  (!) 118/54 (!) 97/56 (!) 132/53  Pulse:  99 85 96  Resp:   18 18  Temp:   98.3 F (36.8 C) 98.7 F (37.1 C)  TempSrc:    Oral   SpO2: 98%  93% 91%  Weight:      Height:        General - Ruberg  morbidly obese African American male, mild respiratory distress HEENT - PERRLA, EOMI, atraumatic head, non tender sinuses. Lung - Clear, basal rales, rhonchi, no wheezes. Heart - S1, S2 heard, no murmurs, rubs, 1+ pedal edema. Abdomen - Soft, non tender, obese, bowel sounds good Neuro - Alert, awake and oriented x 3, non focal exam. Skin - Warm and dry.  Data Reviewed:      Latest Ref Rng & Units 11/10/2023    6:18 AM 11/09/2023    4:35 AM 11/08/2023    4:09 AM  CBC  WBC 4.0 - 10.5 K/uL 10.1  12.3  11.5   Hemoglobin 13.0 - 17.0 g/dL 16.1  09.6  04.5   Hematocrit 39.0 - 52.0 % 49.8  49.4  46.9   Platelets 150 - 400 K/uL 190  200  187       Latest Ref Rng & Units 11/13/2023    4:45 AM 11/10/2023    6:18 AM 11/09/2023    4:35 AM  BMP  Glucose 70 - 99 mg/dL 409  811  914   BUN 6 - 20 mg/dL 18  19  22    Creatinine 0.61 - 1.24 mg/dL 7.82  9.56  2.13   Sodium 135 - 145 mmol/L 142  140  141   Potassium 3.5 - 5.1 mmol/L 3.3  3.6  4.0   Chloride 98 - 111 mmol/L 97  103  103   CO2 22 - 32 mmol/L 33  31  31   Calcium  8.9 - 10.3 mg/dL 9.0  8.9  9.1    No results found.  Family Communication: Discussed with patient, mother at bedside. They understand and agree. All questions answered.  Disposition: Status is: Inpatient Remains inpatient appropriate because: Heart cath, IV lasix , need PAP prior to dc.  Planned Discharge Destination: Home with Home Health     Time spent: 42 minutes  Author: Aisha Hove, MD 11/13/2023 5:17 PM Secure chat 7am to 7pm For on call review [email or web address].

## 2023-11-13 NOTE — Consult Note (Addendum)
 Advanced Heart Failure Team Consult Note   Primary Physician: Dionicia Frater, MD Cardiologist:  Timothy Gollan, MD  Reason for Consultation: Acute systolic heart failure   HPI:    Clifford Nichols is seen today for evaluation of acute systolic heart failure at the request of Dr. Jerelene Monday, Cardiology.   Of note, pt has 2 medical record charts marked for merge. The majority of his PMH is located in other chart. Chart has been personally reviewed.   43 y/o super morbidly obese male (Body mass index is 57.84 kg/m.) w/ chronic hypoxic respiratory failure secondary to [name] and severe OSA, on 4L [place] home O2, HTN, HDL and Type 2 DM.   In 11/2019, he had prolonged hospitalization due to COVID PNA w/ development of septic shock and ARDS requiring intubation, further c/b Acinetobacter HAP. Echo during admit showed LVEF 55-60%, mild LVH, RV moderately enlarged but systolic fx not well visualized. He was screened by the multidisciplinary shock team for consideration for V-V ECMO support but determined not to be a candidate due to markedly elevated BMI, AKI and low potential for successful physical rehabilitation after prolonged ECMO pump run. Fortunately, he recovered. Required trach but able to decanulate. Discharged to CIR.     As noted above, he has severe OSA. Sleep study 7/21 demonstrated an AHI of 83. He was on CPAP but machine broke and went several years w/o treatment. He recently had repeat study done at [facility] which showed AHI of 211.3. During study, he was also noted to have frequent PVCs. He is now being followed by Dr. Gregary Lean and is awaiting BiPAP.   Pt now admitted w/ acute systolic heart failure. He presented on 4/7 w/ a/c respiratory failure/ respiratory distress. O2 sats in the 80s on 4L/[place]. Placed on NRB then ultimately CPAP. He became increasing lethargic and was intubated for airway protection.  Of note, prior to worsening respiratory failure, pt had also endorsed several  month h/o worsening LEE and abdominal swelling/distention.   Pertinent Initial Labs/Diagnostics Findings: Na+/ K+: 136/4.5 Glucose: 230, SCr 1.09 >>peaked 2.4 w/ shock  WBC: 15.3K/L with bands or neutrophil predominance   Lactic Acid 1.3   PCT: 0.27 COVID PCR: Negative, Respiratory Panel negative, Strep PNA and Legionella negative  troponin: 95>>70   BNP: 243 EKG sinus tach 122 bpm  ABG: pO2 59; pCO2 83; pH 7.23;  HCO3 34.8, %O2 Sat 86.2.  D-dimer 1.10  CXR> + RLL infiltrate CTA Chest negative for PE Patient given 30 cc/kg of fluids and started on broad-spectrum antibiotics Vanco cefepime  and Flagyl  for sepsis with septic shock. Patient remained hypotensive despite IVF boluses therefore was started on Levophed . PCCM consulted. Admitted to ICU  BCx + for Staph epidermidis   Echo showed new systolic dysfunction. LVEF 35-40%, global HK, RV moderately enlarged w/ moderately reduced systolic fx, normal estimated RVSP at 22 mmHg, no significant valvular dysfunction. IVC normal in size w/ > 50% respiratory variability.   Pt now improved from respiratory standpoint. Has been extubated and completed abx, aback to baseline O2 requirement. He did however developed volume overload subsequent to IVF resuscitation and now on IV Lasix . Now net negative 7L. SCr stable, 0.87. K 3.3, SBPs 110s. Oral GDMT also initiated, now on Entresto  24-26 bid, Farxiga  10 mg, Toprol  Xl 25 mg and spironolactone  25 mg daily. AHF team asked to assist w/ further management.   No known FH of CHF. + FH of premature CAD. 2 maternal uncles had CABG in their  7s.   Former smoker, recently quit < 6 months ago. Was smoking < 1/2 ppd.   He denies any h/o chest pain. Reports h/o palpitations.   Overall, he is feeling much better. Now on 2L [place]. Tolerating GDMT ok. Mother at bedside.    Echo 11/2019: EF 50-55%, RV mod enlarged, RV systolic fx not well visualized Echo 10/2023: EF 35-40%, RV mod enlarged, moderately reduced systolic fx     Home Medications Prior to Admission medications   Medication Sig Start Date End Date Taking? Authorizing Provider  amLODipine (NORVASC) 10 MG tablet Take 10 mg by mouth daily. 08/15/23  Yes [provider]  lisinopril (ZESTRIL) 20 MG tablet Take 20 mg by mouth daily. 08/15/23  Yes [provider]  metFORMIN  (GLUCOPHAGE ) 1000 MG tablet Take 1,000 mg by mouth 2 (two) times daily. 08/17/23  Yes [provider]    Past Medical History: Past Medical History:  Diagnosis Date   Cardiomyopathy (HCC)    Chronic hypoxic respiratory failure (HCC)    a. Uses 4lpm PRN.   HFrEF (heart failure with reduced ejection fraction) (HCC)    a. 11/2019 Echo: EF 55-60%, no rwma, mild conc LVH, mod enlarged RV w/ nl fxn; b. 10/2023 Echo: EF 35-40%, glob HK, mod reduced RV fxn, mod dil, RVSP .   Obesity    Obesity hypoventilation syndrome (HCC)    OSA (obstructive sleep apnea)    Primary hypertension    Respiratory failure (HCC)    a. 11/2019 COVID-19 ->intubation->prolonged hospitalization. Not candidate for ECMO.   Type 2 diabetes mellitus (HCC)     Past Surgical History: History reviewed. No pertinent surgical history.  Family History: History reviewed. No pertinent family history.  Social History: Social History   Socioeconomic History   Marital status: Married    Spouse name: Clifford Nichols   Number of children: 3   Years of education: Not on file   Highest education level: 12th grade  Occupational History   Occupation: Disability  Tobacco Use   Smoking status: Never    Passive exposure: Never   Smokeless tobacco: Never  Vaping Use   Vaping status: Never Used  Substance and Sexual Activity   Alcohol  use: Never   Drug use: Never   Sexual activity: Not on file  Other Topics Concern   Not on file  Social History Narrative   Lives locally with mother.  Frequently eats processed and fast foods.   Social Drivers of Health   Financial Resource Strain: High  Risk (11/08/2023)   Overall Financial Resource Strain (CARDIA)    Difficulty of Paying Living Expenses: Hard  Food Insecurity: Food Insecurity Present (11/08/2023)   Hunger Vital Sign    Worried About Running Out of Food in the Last Year: Sometimes true    Ran Out of Food in the Last Year: Sometimes true  Transportation Needs: Unmet Transportation Needs (11/08/2023)   PRAPARE - Administrator, Civil Service (Medical): Yes    Lack of Transportation (Non-Medical): Yes  Physical Activity: Not on file  Stress: Not on file  Social Connections: Not on file    Allergies:  Not on File  Objective:    Vital Signs:   Temp:  [97.7 F (36.5 C)-98.8 F (37.1 C)] 98.5 F (36.9 C) (04/21 0738) Pulse Rate:  [85-104] 99 (04/21 0956) Resp:  [16-20] 18 (04/21 0738) BP: (104-134)/(54-86) 118/54 (04/21 0956) SpO2:  [90 %-100 %] 98 % (04/21 0741) FiO2 (%):  [40 %] 40 % (  04/21 0741) Weight:  [177.7 kg] 177.7 kg (04/21 0558) Last BM Date : 11/12/23  Weight change: Filed Weights   11/11/23 0805 11/12/23 0557 11/13/23 0558  Weight: (!) 181.1 kg (!) 177 kg (!) 177.7 kg    Intake/Output:   Intake/Output Summary (Last 24 hours) at 11/13/2023 1032 Last data filed at 11/12/2023 2237 Gross per 24 hour  Intake 1100 ml  Output 2150 ml  Net -1050 ml      Physical Exam    General:  super morbidly obese [place] male. No resp difficulty HEENT: normal Neck: supple. Thick neck, JVP not well visualized. Carotids 2+ bilat; no bruits. No lymphadenopathy or thyromegaly appreciated. Cor: PMI nondisplaced. Regular rate & rhythm. No rubs, gallops or murmurs. Lungs: clear Abdomen: obese, soft, nontender, nondistended. No hepatosplenomegaly. No bruits or masses. Good bowel sounds. Extremities: no cyanosis, clubbing, rash, edema Neuro: alert & orientedx3, cranial nerves grossly intact. moves all 4 extremities w/o difficulty. Affect pleasant   Telemetry   NSR 90s, very few PVCs. Personally reviewed    EKG    EKG 4/20: ST vs 2:1 AFL 101 bpm, personally reviewed   Labs   Basic Metabolic Panel: Recent Labs  Lab 11/07/23 1215 11/08/23 0409 11/09/23 0435 11/10/23 0618 11/13/23 0445  NA 137 140 141 140 142  K 4.1 4.1 4.0 3.6 3.3*  CL 105 106 103 103 97*  CO2 27 29 31 31  33*  GLUCOSE 218* 135* 122* 134* 140*  BUN 28* 25* 22* 19 18  CREATININE 0.94 0.82 0.84 0.71 0.87  CALCIUM  9.1 9.1 9.1 8.9 9.0    Liver Function Tests: Recent Labs  Lab 11/13/23 0445  AST 106*  ALT 230*  ALKPHOS 43  BILITOT 0.6  PROT 6.9  ALBUMIN 3.2*   No results for input(s): "LIPASE", "AMYLASE" in the last 168 hours. No results for input(s): "AMMONIA" in the last 168 hours.  CBC: Recent Labs  Lab 11/08/23 0409 11/09/23 0435 11/10/23 0618  WBC 11.5* 12.3* 10.1  NEUTROABS 9.0* 9.3* 7.1  HGB 13.1 13.6 13.7  HCT 46.9 49.4 49.8  MCV 93.6 96.1 94.9  PLT 187 200 190    Cardiac Enzymes: No results for input(s): "CKTOTAL", "CKMB", "CKMBINDEX", "TROPONINI" in the last 168 hours.  BNP: BNP (last 3 results) Recent Labs    10/30/23 2348 10/31/23 2030  BNP 243.7* 129.1*    ProBNP (last 3 results) No results for input(s): "PROBNP" in the last 8760 hours.   CBG: Recent Labs  Lab 11/12/23 0908 11/12/23 1150 11/12/23 1632 11/12/23 2108 11/13/23 0735  GLUCAP 176* 150* 154* 179* 165*    Coagulation Studies: No results for input(s): "LABPROT", "INR" in the last 72 hours.   Imaging   No results found.   Medications:     Current Medications:  aspirin  EC  81 mg Oral Daily   atorvastatin   40 mg Oral Daily   budesonide  (PULMICORT ) nebulizer solution  0.25 mg Nebulization BID   dapagliflozin  propanediol  10 mg Oral Daily   enoxaparin  (LOVENOX ) injection  85 mg Subcutaneous Q24H   furosemide   40 mg Intravenous BID   insulin  aspart  0-20 Units Subcutaneous TID WC   insulin  aspart  0-5 Units Subcutaneous QHS   insulin  glargine-yfgn  20 Units Subcutaneous BID    ipratropium-albuterol   3 mL Nebulization TID   metoprolol  succinate  25 mg Oral Daily   multivitamin with minerals  1 tablet Oral Daily   mouth rinse  15 mL Mouth Rinse 4 times per  day   [START ON 11/14/2023] predniSONE   10 mg Oral Q breakfast   sacubitril -valsartan   1 tablet Oral BID   sodium chloride  flush  3 mL Intravenous Q12H   [START ON 11/14/2023] spironolactone   25 mg Oral Daily    Infusions:     Patient Profile   43 y/o super morbidly obese male (Body mass index is 57.84 kg/m.) w/ chronic hypoxic respiratory failure secondary to [name] and severe OSA, on 4L [place] home O2, HTN and Type 2 DM admitted w/ acute on chronic hypoxic respiratory failure and septic shock 2/2 CAP and staph hominis bacteremia and acute systolic heart failure.   Assessment/Plan   1. Acute Systolic Heart Failure - Echo 5409 EF 50-55%, RV not well visualized - Echo this admit EF 35-40%, diffuse HK, RV mod reduced  - Etiology uncertain. ? PVC mediated/ hypertensive heart disease. He has multiple risk factors for CAD though no h/o ischemic CP. Hs trop this admit not c/w ACS. Obesity/body habitus makes volume assessment difficult. Would benefit from [facility] to assess volume status and r/o ischemic CM, plan likely tomorrow - NYHA Class III, confounded by obesity/deconditioning but symptoms overall improved since admission  - Continue Entresto  24-26 mg bid - Continue Farxiga  10 mg daily, good hygiene will be important given body habitus   - Increase Spironolactone  to 25 mg daily  - Continue Toprol  XL 25 mg daily  - on Lasix  40 mg bid. As noted above, will need RHC   2. Septic Shock  - 2/2 CAP and Bacteremia - resolved - completed abx - stable off IV pressors   3. A/c Hypoxic Respiratory Failure - [name]/OSA on 4L [place] home O2 at baseline  - required intubation in setting of PNA  - now extubated and completed abx, back to baseline O2 requirements    4. Severe OSA/[name]  - most recent sleep study 3/25 w/ severely  elevated AHI of 211  - followed by Pulm (Dr. Gregary Lean) awaiting home BiPAP  - wt loss imperative. Needs GLP1 Wegovy (semaglutide). PharmD to check cost /insurance approval   5. 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 If contributing to CM - frequent PVCs not noted this admit but can be variable. Recommend 2 wk outpatient Zio to quantify burden. If significant, may need Mexiletine to suppress until he receives his home Bipap  - needs BiPAP per above   6. AKI   - SCr 1.09 on admit >>peaked 2.4 w/ shock - now resolved, 0.87 today   7. Hyperkalemia - K 3.3  - give K supp - increase spironolactone  to 25 mg daily  - check Mg level   8. Type 2DM  - Hgb A1c 6.8 - now on Farxiga   - recommend GLP1   9. Morbid Obesity  Body mass index is 57.84 kg/m. - refer to pharmD clinic for GLP1   10. HLD, LDL Goal < 70 - LDL elevated at 140 - atorva 40 mg daily  - repeat FLP in 6-8 wks    Length of Stay: [address], PA-C  11/13/2023, 10:32 AM    Advanced Heart Failure Team Pager [phone] (M-F; 7a - 5p)  Please contact [facility] for night-coverage after hours (4p -7a ) and weekends on [email or web address]  Patient seen and examined with the above-signed Advanced Practice Provider and/or Housestaff. I personally reviewed laboratory data, imaging studies and relevant notes. I independently examined the patient and formulated  the important aspects of the plan. I have edited the note to reflect any of my changes or salient points. I have personally discussed the plan with the patient and/or family.  43 y/o male with morbid obesity, very severe OSA (AHI > 200), DM2, PVCs.  Admitted with respiratory failure and volume overload. Echo with drop in EF to 35-40%.  Has now diuresed and breathing better. ECG with anterolateral and inferior Qs  General:  Markedly obese male sitting up in bed No resp difficulty HEENT: normal Neck: supple. no JVD.   Cor: Distant HS  Regular rate & rhythm. No rubs, gallops or murmurs. Lungs: clear Abdomen: obese soft, nontender, nondistended.Good bowel sounds. Extremities: no cyanosis, clubbing, rash, edema Neuro: alert & orientedx3, cranial nerves grossly intact. moves all 4 extremities w/o difficulty. Affect pleasant  He has progressive LV dysfunction. Given multiple CRFs and abnormal ECG we discussed conservative therapy verus and invasive approach to evalaute for CAD. Will proceed with R/L cath in am. Discussed need for weight loss and treatment of OSA. Will need outpatient monitor to quantify PVC burden.   Jules Oar, MD  11:21 PM

## 2023-11-13 NOTE — Plan of Care (Signed)
  Problem: Cardiovascular: Goal: Ability to achieve and maintain adequate cardiovascular perfusion will improve Outcome: Progressing   Problem: Health Behavior/Discharge Planning: Goal: Ability to safely manage health-related needs after discharge will improve Outcome: Progressing   Problem: Activity: Goal: Ability to return to baseline activity level will improve Outcome: Progressing   Problem: Education: Goal: Understanding of CV disease, CV risk reduction, and recovery process will improve Outcome: Progressing   Problem: Education: Goal: Knowledge of General Education information will improve Description: Including pain rating scale, medication(s)/side effects and non-pharmacologic comfort measures Outcome: Progressing   Problem: Health Behavior/Discharge Planning: Goal: Ability to manage health-related needs will improve Outcome: Progressing   Problem: Clinical Measurements: Goal: Ability to maintain clinical measurements within normal limits will improve Outcome: Progressing   Problem: Clinical Measurements: Goal: Will remain free from infection Outcome: Progressing   Problem: Clinical Measurements: Goal: Diagnostic test results will improve Outcome: Progressing   Problem: Clinical Measurements: Goal: Respiratory complications will improve Outcome: Progressing   Problem: Clinical Measurements: Goal: Cardiovascular complication will be avoided Outcome: Progressing   Problem: Activity: Goal: Risk for activity intolerance will decrease Outcome: Progressing   Problem: Nutrition: Goal: Adequate nutrition will be maintained Outcome: Progressing   Problem: Coping: Goal: Level of anxiety will decrease Outcome: Progressing   Problem: Elimination: Goal: Will not experience complications related to urinary retention Outcome: Progressing   Problem: Pain Managment: Goal: General experience of comfort will improve and/or be controlled Outcome: Progressing    Problem: Safety: Goal: Ability to remain free from injury will improve Outcome: Progressing   Problem: Skin Integrity: Goal: Risk for impaired skin integrity will decrease Outcome: Progressing   Problem: Education: Goal: Ability to describe self-care measures that may prevent or decrease complications (Diabetes Survival Skills Education) will improve Outcome: Progressing   Problem: Coping: Goal: Ability to adjust to condition or change in health will improve Outcome: Progressing   Problem: Fluid Volume: Goal: Ability to maintain a balanced intake and output will improve Outcome: Progressing   Problem: Health Behavior/Discharge Planning: Goal: Ability to manage health-related needs will improve Outcome: Progressing   Problem: Metabolic: Goal: Ability to maintain appropriate glucose levels will improve Outcome: Progressing   Problem: Nutritional: Goal: Maintenance of adequate nutrition will improve Outcome: Progressing   Problem: Nutritional: Goal: Progress toward achieving an optimal weight will improve Outcome: Progressing

## 2023-11-13 NOTE — Telephone Encounter (Signed)
 Advanced Heart Failure Patient Advocate Encounter  Test billing for GLP1s returned the following information:  Mounjaro , Ozempic, Trulicity : $4.80 copay  Saxenda, Wegovy, Zepbound: Not covered   Clifford Nichols, CPhT Rx Patient Advocate Phone: [phone]

## 2023-11-13 NOTE — Progress Notes (Signed)
 Heart Failure Stewardship Pharmacy Note  PCP: Dionicia Frater, MD PCP-Cardiologist: Timothy Gollan, MD  HPI: Clifford Nichols is a 43 y.o. male with history of severe OSA, chronic respiraotry failure on 4L, extreme obesity with Alveolar hypoventilation, COVID ARDS in 2021, DM, OSA, and HTN who presented with shortness of breath. Found to be hypoxic on home 4L of O2, transitioned to CPAP, then eventually intubated after becoming unresponsive. Patient was febrile on admission and blood cultures grew staphylococcus hominis and epidermidis, though possibly a contaminant. On admission, BNP was 243.7 (falsely low due to morbid obesity), HS-troponin was 95, lactic acid was 1.3 > 1.8, A1c was 6.8, and PCT was 0.27. Chest x-ray noted low lung volumes with mild right basilar atelectasis and/or infiltrate. CT did not note PE, however did show marked severity areas of bilateral lower lobe scarring, atelectasis, or infiltrate. Most recent CXR noted low volume under penetrated film with vascular congestion and probable retrocardiac collapse/consolidation.   Pertinent Cardiac History: Echocardiogram 11/2019 with LVEF 55-60%. Echocardiogram this admission noted LVEF of 35-40%, moderately reduced RV function.  Pertinent Lab Values: Creatinine, Ser  Date Value Ref Range Status  11/13/2023 0.87 0.61 - 1.24 mg/dL Final   BUN  Date Value Ref Range Status  11/13/2023 18 6 - 20 mg/dL Final   Potassium  Date Value Ref Range Status  11/13/2023 3.3 (L) 3.5 - 5.1 mmol/L Final   Sodium  Date Value Ref Range Status  11/13/2023 142 135 - 145 mmol/L Final   B Natriuretic Peptide  Date Value Ref Range Status  10/31/2023 129.1 (H) 0.0 - 100.0 pg/mL Final    Comment:    Performed at [facility], [address] [place], [place] [address]   Magnesium   Date Value Ref Range Status  11/06/2023 2.2 1.7 - 2.4 mg/dL Final    Comment:    Performed at [facility], [address]  [place], [place] [address]   Hgb A1c MFr Bld  Date Value Ref Range Status  10/31/2023 6.8 (H) 4.8 - 5.6 % Final    Comment:    (NOTE)         Prediabetes: 5.7 - 6.4         Diabetes: >6.4         Glycemic control for adults with diabetes: <7.0    TSH  Date Value Ref Range Status  11/03/2023 0.954 0.350 - 4.500 uIU/mL Final    Comment:    Performed by a 3rd Generation assay with a functional sensitivity of <=0.01 uIU/mL. Performed at [facility], [address] [place], [place] [address]     Vital Signs: Admission weight:414.91 lbs Temp:  [97.7 F (36.5 C)-98.8 F (37.1 C)] 98.5 F (36.9 C) (04/21 0738) Pulse Rate:  [85-104] 99 (04/21 0738) Cardiac Rhythm: Normal sinus rhythm (04/21 0800) Resp:  [16-20] 18 (04/21 0738) BP: (104-134)/(54-86) 118/54 (04/21 0738) SpO2:  [90 %-100 %] 98 % (04/21 0741) FiO2 (%):  [40 %] 40 % (04/21 0741) Weight:  [177.7 kg (391 lb 11.2 oz)] 177.7 kg (391 lb 11.2 oz) (04/21 0558)  Intake/Output Summary (Last 24 hours) at 11/13/2023 0906 Last data filed at 11/12/2023 2237 Gross per 24 hour  Intake 1100 ml  Output 2150 ml  Net -1050 ml   Current Heart Failure Medications:  Loop diuretic: furosemide  40 mg IV BID Beta-Blocker: metoprolol  succinate 25 mg daily ACEI/ARB/ARNI: Entresto  24-26 mg BID MRA: spironolactone  12.5 mg daily SGLT2i: Farxiga  10 mg daily Other: none  Prior to admission Heart Failure Medications:  Loop diuretic: none Beta-Blocker: none ACEI/ARB/ARNI: lisinopril 20 mg daily MRA: none SGLT2i: none Other: amlodipine 10 mg daily  Assessment: 1. 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 NYHA class IV symptoms.  -Symptoms: Reports feeling much better. O2 requirement down. No complaints.  -Volume: Volume status difficult to assess due to body habitus. Weight is variable, likely due to bed wieght vs standing weight.. Received 2 days total of Lasix  and  3 days of acetazolamide  with none for several days. Creatinine stable.   -Hemodynamics: BP is elevated. HR 90-100s. -WU:[id number] consider a beta-1 selective agent closer to discharge. Recommend against carvedilol given extensive lung disease history.  -ACEI/ARB/ARNI: BP remains mildly elevated on Entresto  24-26 mg BID. Can consider increasing if required for BP control. -MRA: Doing well on spironolactone  12.5 mg daily. Consider increasing today.  -SGLT2i: Continue Farxiga  10 mg daily -Consider swapping Trulicity to Mounjaro  outpatient for OSA indication and greater weight loss.  Plan: 1) Medication changes recommended at this time: -Consider increasing spironolactone  to 25 mg daily  2) Patient assistance: -Copay for Entresto , Farxiga , Jardiance is $4.80  3) Education: - Patient has been educated on current HF medications and potential additions to HF medication regimen - Patient verbalizes understanding that over the next few months, these medication doses may change and more medications may be added to optimize HF regimen - Patient has been educated on basic disease state pathophysiology and goals of therapy  Medication Assistance / Insurance Benefits Check: Does the patient have prescription insurance?    Type of insurance plan:  Does the patient qualify for medication assistance through manufacturers or grants? No   Outpatient Pharmacy: Prior to admission outpatient pharmacy: [facility] Is the patient agreeable to switch to [facility]?: Yes Is the patient willing to utilize a [facility] pharmacy at discharge?: Yes  Please do not hesitate to reach out with questions or concerns,  Bevely Brush, PharmD, CPP, BCPS Heart Failure Pharmacist  Phone - [phone] 11/13/2023 9:06 AM

## 2023-11-13 NOTE — Discharge Instructions (Signed)
 Heart Healthy, Consistent Carbohydrate Nutrition Therapy   A heart-healthy and consistent carbohydrate diet is recommended to manage heart disease and diabetes. To follow a heart-healthy and consistent carbohydrate diet, Eat a balanced diet with whole grains, fruits and vegetables, and lean protein sources.  Choose heart-healthy unsaturated fats. Limit saturated fats, trans fats, and cholesterol intake. Eat more plant-based or vegetarian meals using beans and soy foods for protein.  Eat whole, unprocessed foods to limit the amount of sodium (salt) you eat.  Choose a consistent amount of carbohydrate at each meal and snack. Limit refined carbohydrates especially sugar, sweets and sugar-sweetened beverages.  If you drink alcohol, do so in moderation: one serving per day (women) and two servings per day (men). o One serving is equivalent to 12 ounces beer, 5 ounces wine, or 1.5 ounces distilled spirits  Tips Tips for Choosing Heart-Healthy Fats Choose lean protein and low-fat dairy foods to reduce saturated fat intake. Saturated fat is usually found in animal-based protein and is associated with certain health risks. Saturated fat is the biggest contributor to raise low-density lipoprotein (LDL) cholesterol levels. Research shows that limiting saturated fat lowers unhealthy cholesterol levels. Eat no more than 7% of your total calories each day from saturated fat. Ask your RDN to help you determine how much saturated fat is right for you. There are many foods that do not contain large amounts of saturated fats. Swapping these foods to replace foods high in saturated fats will help you limit the saturated fat you eat and improve your cholesterol levels. You can also try eating more plant-based or vegetarian meals. Instead of. Try:  Whole milk, cheese, yogurt, and ice cream 1% or skim milk, low-fat cheese, non-fat yogurt, and low-fat ice cream  Fatty, marbled beef and pork Lean beef, pork, or venison   Poultry with skin Poultry without skin  Butter, stick margarine Reduced-fat, whipped, or liquid spreads  Coconut oil, palm oil Liquid vegetable oils: corn, canola, olive, soybean and safflower oils   Avoid foods that contain trans fats. Trans fats increase levels of LDL-cholesterol. Hydrogenated fat in processed foods is the main source of trans fats in foods.  Trans fats can be found in stick margarine, shortening, processed sweets, baked goods, some fried foods, and packaged foods made with hydrogenated oils. Avoid foods with "partially hydrogenated oil" on the ingredient list such as: cookies, pastries, baked goods, biscuits, crackers, microwave popcorn, and frozen dinners. Choose foods with heart healthy fats. Polyunsaturated and monounsaturated fat are unsaturated fats that may help lower your blood cholesterol level when used in place of saturated fat in your diet. Ask your RDN about taking a dietary supplement with plant sterols and stanols to help lower your cholesterol level. Research shows that substituting saturated fats with unsaturated fats is beneficial to cholesterol levels. Try these easy swaps: Instead of. Try:  Butter, stick margarine, or solid shortening Reduced-fat, whipped, or liquid spreads  Beef, pork, or poultry with skin Fish and seafood  Chips, crackers, snack foods Raw or unsalted nuts and seeds or nut butters Hummus with vegetables Avocado on toast  Coconut oil, palm oil Liquid vegetable oils: corn, canola, olive, soybean and safflower oils  Limit the amount of cholesterol you eat to less than 200 milligrams per day. Cholesterol is a substance carried through the bloodstream via lipoproteins, which are known as "transporters" of fat. Some body functions need cholesterol to work properly, but too much cholesterol in the bloodstream can damage arteries and build up blood vessel linings (  which can lead to heart attack and stroke). You should eat less than 200 milligrams  cholesterol per day. People respond differently to eating cholesterol. There is no test available right now that can figure out which people will respond more to dietary cholesterol and which will respond less. For individuals with high intake of dietary cholesterol, different types of increase (none, small, moderate, large) in LDL-cholesterol levels are all possible.  Food sources of cholesterol include egg yolks and organ meats such as liver, gizzards. Limit egg yolks to two to four per week and avoid organ meats like liver and gizzards to control cholesterol intake. Tips for Choosing Heart-Healthy Carbohydrates Consume a consistent amount of carbohydrate It is important to eat foods with carbohydrates in moderation because they impact your blood glucose level. Carbohydrates can be found in many foods such as: Grains (breads, crackers, rice, pasta, and cereals)  Starchy Vegetables (potatoes, corn, and peas)  Beans and legumes  Milk, soy milk, and yogurt  Fruit and fruit juice  Sweets (cakes, cookies, ice cream, jam and jelly) Your RDN will help you set a goal for how many carbohydrate servings to eat at your meals and snacks. For many adults, eating 3 to 5 servings of carbohydrate foods at each meal and 1 or 2 carbohydrate servings for each snack works well.  Check your blood glucose level regularly. It can tell you if you need to adjust when you eat carbohydrates. Choose foods rich in viscous (soluble) fiber Viscous, or soluble, is found in the walls of plant cells. Viscous fiber is found only in plant-based foods. Eating foods with fiber helps to lower your unhealthy cholesterol and keep your blood glucose in range  Rich sources of viscous fiber include vegetables (asparagus, Brussels sprouts, sweet potatoes, turnips) fruit (apricots, mangoes, oranges), legumes, and whole grains (barley, oats, and oat bran).  As you increase your fiber intake gradually, also increase the amount of water you  drink. This will help prevent constipation.  If you have difficulty achieving this goal, ask your RDN about fiber laxatives. Choose fiber supplements made with viscous fibers such as psyllium seed husks or methylcellulose to help lower unhealthy cholesterol.  Limit refined carbohydrates  There are three types of carbohydrates: starches, sugar, and fiber. Some carbohydrates occur naturally in food, like the starches in rice or corn or the sugars in fruits and milk. Refined carbohydrates--foods with high amounts of simple sugars--can raise triglyceride levels. High triglyceride levels are associated with coronary heart disease. Some examples of refined carbohydrate foods are table sugar, sweets, and beverages sweetened with added sugar. Tips for Reducing Sodium (Salt) Although sodium is important for your body to function, too much sodium can be harmful for people with high blood pressure. As sodium and fluid buildup in your tissues and bloodstream, your blood pressure increases. High blood pressure may cause damage to other organs and increase your risk for a stroke. Even if you take a pill for blood pressure or a water pill (diuretic) to remove fluid, it is still important to have less salt in your diet. Ask your doctor and RDN what amount of sodium is right for you. Avoid processed foods. Eat more fresh foods.  Fresh fruits and vegetables are naturally low in sodium, as well as frozen vegetables and fruits that have no added juices or sauces.  Fresh meats are lower in sodium than processed meats, such as bacon, sausage, and hotdogs. Read the nutrition label or ask your butcher to help you find a  fresh meat that is low in sodium. Eat less salt--at the table and when cooking.  A single teaspoon of table salt has 2,300 mg of sodium.  Leave the salt out of recipes for pasta, casseroles, and soups.  Ask your RDN how to cook your favorite recipes without sodium Be a smart shopper.  Look for food packages  that say "salt-free" or "sodium-free." These items contain less than 5 milligrams of sodium per serving.  "Very low-sodium" products contain less than 35 milligrams of sodium per serving.  "Low-sodium" products contain less than 140 milligrams of sodium per serving.  Beware for "Unsalted" or "No Added Salt" products. These items may still be high in sodium. Check the nutrition label. Add flavors to your food without adding sodium.  Try lemon juice, lime juice, fruit juice or vinegar.  Dry or fresh herbs add flavor. Try basil, bay leaf, dill, rosemary, parsley, sage, dry mustard, nutmeg, thyme, and paprika.  Pepper, red pepper flakes, and cayenne pepper can add spice t your meals without adding sodium. Hot sauce contains sodium, but if you use just a drop or two, it will not add up to much.  Buy a sodium-free seasoning blend or make your own at home. Additional Lifestyle Tips Achieve and maintain a healthy weight. Talk with your RDN or your doctor about what is a healthy weight for you. Set goals to reach and maintain that weight.  To lose weight, reduce your calorie intake along with increasing your physical activity. A weight loss of 10 to 15 pounds could reduce LDL-cholesterol by 5 milligrams per deciliter. Participate in physical activity. Talk with your health care team to find out what types of physical activity are best for you. Set a plan to get about 30 minutes of exercise on most days.  Foods Recommended Food Group Foods Recommended  Grains Whole grain breads and cereals, including whole wheat, barley, rye, buckwheat, corn, teff, quinoa, millet, amaranth, brown or wild rice, sorghum, and oats Pasta, especially whole wheat or other whole grain types  [employer] [place] [employer], quinoa or wild rice Whole grain crackers, bread, rolls, pitas Home-made bread with reduced-sodium baking soda  Protein Foods Lean cuts of beef and pork (loin, leg, round, extra lean hamburger)  Skinless [employer] photographer  and other wild game Dried beans and peas Nuts and nut butters Meat alternatives made with soy or textured vegetable protein  Egg whites or egg substitute Cold cuts made with lean meat or soy protein  Dairy Nonfat (skim), low-fat, or 1%-fat milk  Nonfat or low-fat yogurt or cottage cheese Fat-free and low-fat cheese  Vegetables Fresh, frozen, or canned vegetables without added fat or salt   Fruits Fresh, frozen, canned, or dried fruit   Oils Unsaturated oils (corn, olive, peanut, soy, sunflower, canola)  Soft or liquid margarines and vegetable oil spreads  Salad dressings Seeds and nuts  Avocado   Foods Not Recommended Food Group Foods Not Recommended  Grains Breads or crackers topped with salt Cereals (hot or cold) with more than 300 mg sodium per serving Biscuits, cornbread, and other "quick" breads prepared with baking soda Bread crumbs or stuffing mix from a store High-fat bakery products, such as doughnuts, biscuits, croissants, danish pastries, pies, cookies Instant cooking foods to which you add hot water and stir--potatoes, noodles, rice, etc. Packaged starchy foods--seasoned noodle or rice dishes, stuffing mix, macaroni and cheese dinner Snacks made with partially hydrogenated oils, including chips, cheese puffs, snack mixes, regular crackers, butter-flavored popcorn  Protein Foods  Higher-fat cuts of meats (ribs, t-bone steak, regular hamburger) Bacon, sausage, or hot dogs Cold cuts, such as salami or bologna, deli meats, cured meats, corned beef Organ meats (liver, brains, gizzards, sweetbreads) Poultry with skin Fried or smoked meat, poultry, and fish Whole eggs and egg yolks (more than 2-4 per week) Salted legumes, nuts, seeds, or nut/seed butters Meat alternatives with high levels of sodium (>300 mg per serving) or saturated fat (>5 g per serving)  Dairy Whole milk,?2% fat milk, buttermilk Whole milk yogurt or ice cream Cream Half-&-half Cream cheese Sour  cream Cheese  Vegetables Canned or frozen vegetables with salt, fresh vegetables prepared with salt, butter, cheese, or cream sauce Fried vegetables Pickled vegetables such as olives, pickles, or sauerkraut  Fruits Fried fruits Fruits served with butter or cream  Oils Butter, stick margarine, shortening Partially hydrogenated oils or trans fats Tropical oils (coconut, palm, palm kernel oils)  Other Candy, sugar sweetened soft drinks and desserts Salt, sea salt, garlic salt, and seasoning mixes containing salt Bouillon cubes Ketchup, barbecue sauce, Worcestershire sauce, soy sauce, teriyaki sauce Miso Salsa Pickles, olives, relish   Heart Healthy Consistent Carbohydrate Vegetarian (Lacto-Ovo) Sample 1-Day Menu  Breakfast 1 cup oatmeal, cooked (2 carbohydrate servings)   cup blueberries (1 carbohydrate serving)  11 almonds, without salt  1 cup 1% milk (1 carbohydrate serving)  1 cup coffee  Morning Snack 1 cup fat-free plain yogurt (1 carbohydrate serving)  Lunch 1 whole wheat bun (1 carbohydrate servings)  1 black bean burger (1 carbohydrate servings)  1 slice cheddar cheese, low sodium  2 slices tomatoes  2 leaves lettuce  1 teaspoon mustard  1 small pear (1 carbohydrate servings)  1 cup green tea, unsweetened  Afternoon Snack 1/3 cup trail mix with nuts, seeds, and raisins, without salt (1 carbohydrate servinga)  Evening Meal  cup meatless chicken  2/3 cup brown rice, cooked (2 carbohydrate servings)  1 cup broccoli, cooked (2/3 carbohydrate serving)   cup carrots, cooked (1/3 carbohydrate serving)  2 teaspoons olive oil  1 teaspoon balsamic vinegar  1 whole wheat dinner roll (1 carbohydrate serving)  1 teaspoon margarine, soft, tub  1 cup 1% milk (1 carbohydrate serving)  Evening Snack 1 extra small banana (1 carbohydrate serving)  1 tablespoon peanut butter   Heart Healthy Consistent Carbohydrate Vegan Sample 1-Day Menu  Breakfast 1 cup oatmeal, cooked (2  carbohydrate servings)   cup blueberries (1 carbohydrate serving)  11 almonds, without salt  1 cup soymilk fortified with calcium, vitamin B12, and vitamin D  1 cup coffee  Morning Snack 6 ounces soy yogurt (1 carbohydrate servings)  Lunch 1 whole wheat bun(1 carbohydrate servings)  1 black bean burger (1 carbohydrate serving)  2 slices tomatoes  2 leaves lettuce  1 teaspoon mustard  1 small pear (1 carbohydrate servings)  1 cup green tea, unsweetened  Afternoon Snack 1/3 cup trail mix with nuts, seeds, and raisins, without salt (1 carbohydrate servings)  Evening Meal  cup meatless chicken  2/3 cup brown rice, cooked (2 carbohydrate servings)  1 cup broccoli, cooked (2/3 carbohydrate serving)   cup carrots, cooked (1/3 carbohydrate serving)  2 teaspoons olive oil  1 teaspoon balsamic vinegar  1 whole wheat dinner roll (1 carbohydrate serving)  1 teaspoon margarine, soft, tub  1 cup soymilk fortified with calcium, vitamin B12, and vitamin D  Evening Snack 1 extra small banana (1 carbohydrate serving)  1 tablespoon peanut butter    Heart Healthy Consistent Carbohydrate Sample  1-Day Menu  Breakfast 1 cup cooked oatmeal (2 carbohydrate servings)  3/4 cup blueberries (1 carbohydrate serving)  1 ounce almonds  1 cup skim milk (1 carbohydrate serving)  1 cup coffee  Morning Snack 1 cup sugar-free nonfat yogurt (1 carbohydrate serving)  Lunch 2 slices whole-wheat bread (2 carbohydrate servings)  2 ounces lean [place] breast  1 ounce low-fat Swiss cheese  1 teaspoon mustard  1 slice tomato  1 lettuce leaf  1 small pear (1 carbohydrate serving)  1 cup skim milk (1 carbohydrate serving)  Afternoon Snack 1 ounce trail mix with unsalted nuts, seeds, and raisins (1 carbohydrate serving)  Evening Meal 3 ounces salmon  2/3 cup cooked brown rice (2 carbohydrate servings)  1 teaspoon soft margarine  1 cup cooked broccoli with 1/2 cup cooked carrots (1 carbohydrate serving  Carrots,  cooked, boiled, drained, without salt  1 cup lettuce  1 teaspoon olive oil with vinegar for dressing  1 small whole grain roll (1 carbohydrate serving)  1 teaspoon soft margarine  1 cup unsweetened tea  Evening Snack 1 extra-small banana (1 carbohydrate serving)  Copyright 2020  [employer]. All rights reserved.

## 2023-11-13 NOTE — Telephone Encounter (Signed)
 Copied from CRM [id number]. Topic: Clinical - Prescription Issue >> Nov 13, 2023  2:26 PM Clifford Nichols wrote: Reason for CRM: pt states he is in the hospital.  They will not let pt go home w/out a BiPap machine.  He wants to know if Dr Viva Grise has signed off on the BiPAP yet. Please call pt at [phone] Pt states he thinks she needs to sign off before he can leave

## 2023-11-13 NOTE — Telephone Encounter (Addendum)
 Per Dr. Viva Grise, she did receive a fax from [name] asking for an order for a Bipap S/T.  Okay to place order BiPAP ST 26/16 with back up rate 14 and O2  2Lpm bled in.  Order have been placed and I have notified the patient's wife (DPR).   Nothing further needed.

## 2023-11-14 ENCOUNTER — Encounter
Admission: EM | Disposition: A | Discharge status: Home Health | Payer: Self-pay | Source: Home / Self Care | Attending: Internal Medicine

## 2023-11-14 DIAGNOSIS — I5021 Acute systolic (congestive) heart failure: Secondary | ICD-10-CM | POA: Diagnosis not present

## 2023-11-14 DIAGNOSIS — J9621 Acute and chronic respiratory failure with hypoxia: Secondary | ICD-10-CM | POA: Diagnosis not present

## 2023-11-14 DIAGNOSIS — G4733 Obstructive sleep apnea (adult) (pediatric): Secondary | ICD-10-CM | POA: Diagnosis not present

## 2023-11-14 HISTORY — PX: RIGHT/LEFT HEART CATH AND CORONARY ANGIOGRAPHY: CATH118266

## 2023-11-14 LAB — POCT I-STAT EG7
Acid-Base Excess: 3 mmol/L — ABNORMAL HIGH (ref 0.0–2.0)
Acid-Base Excess: 8 mmol/L — ABNORMAL HIGH (ref 0.0–2.0)
Bicarbonate: 29.6 mmol/L — ABNORMAL HIGH (ref 20.0–28.0)
Bicarbonate: 35.8 mmol/L — ABNORMAL HIGH (ref 20.0–28.0)
Calcium, Ion: 0.84 mmol/L — CL (ref 1.15–1.40)
Calcium, Ion: 1.13 mmol/L — ABNORMAL LOW (ref 1.15–1.40)
HCT: 43 % (ref 39.0–52.0)
HCT: 49 % (ref 39.0–52.0)
Hemoglobin: 14.6 g/dL (ref 13.0–17.0)
Hemoglobin: 16.7 g/dL (ref 13.0–17.0)
O2 Saturation: 62 %
O2 Saturation: 62 %
Potassium: 2.7 mmol/L — CL (ref 3.5–5.1)
Potassium: 3.6 mmol/L (ref 3.5–5.1)
Sodium: 139 mmol/L (ref 135–145)
Sodium: 140 mmol/L (ref 135–145)
TCO2: 31 mmol/L (ref 22–32)
TCO2: 38 mmol/L — ABNORMAL HIGH (ref 22–32)
pCO2, Ven: 52.1 mmHg (ref 44–60)
pCO2, Ven: 58.3 mmHg (ref 44–60)
pH, Ven: 7.363 (ref 7.25–7.43)
pH, Ven: 7.396 (ref 7.25–7.43)
pO2, Ven: 33 mmHg (ref 32–45)
pO2, Ven: 34 mmHg (ref 32–45)

## 2023-11-14 LAB — POCT I-STAT 7, (LYTES, BLD GAS, ICA,H+H)
Acid-Base Excess: 6 mmol/L — ABNORMAL HIGH (ref 0.0–2.0)
Bicarbonate: 31.9 mmol/L — ABNORMAL HIGH (ref 20.0–28.0)
Calcium, Ion: 0.96 mmol/L — ABNORMAL LOW (ref 1.15–1.40)
HCT: 44 % (ref 39.0–52.0)
Hemoglobin: 15 g/dL (ref 13.0–17.0)
O2 Saturation: 88 %
Potassium: 3 mmol/L — ABNORMAL LOW (ref 3.5–5.1)
Sodium: 144 mmol/L (ref 135–145)
TCO2: 33 mmol/L — ABNORMAL HIGH (ref 22–32)
pCO2 arterial: 49.7 mmHg — ABNORMAL HIGH (ref 32–48)
pH, Arterial: 7.416 (ref 7.35–7.45)
pO2, Arterial: 56 mmHg — ABNORMAL LOW (ref 83–108)

## 2023-11-14 LAB — BASIC METABOLIC PANEL WITH GFR
Anion gap: 10 (ref 5–15)
BUN: 22 mg/dL — ABNORMAL HIGH (ref 6–20)
CO2: 32 mmol/L (ref 22–32)
Calcium: 9.2 mg/dL (ref 8.9–10.3)
Chloride: 96 mmol/L — ABNORMAL LOW (ref 98–111)
Creatinine, Ser: 1 mg/dL (ref 0.61–1.24)
GFR, Estimated: >60 mL/min (ref >60)
Glucose, Bld: 146 mg/dL — ABNORMAL HIGH (ref 70–99)
Potassium: 3.3 mmol/L — ABNORMAL LOW (ref 3.5–5.1)
Sodium: 138 mmol/L (ref 135–145)

## 2023-11-14 LAB — GLUCOSE, CAPILLARY
Glucose-Capillary: 139 mg/dL — ABNORMAL HIGH (ref 70–99)
Glucose-Capillary: 103 mg/dL — ABNORMAL HIGH (ref 70–99)
Glucose-Capillary: 126 mg/dL — ABNORMAL HIGH (ref 70–99)
Glucose-Capillary: 126 mg/dL — ABNORMAL HIGH (ref 70–99)

## 2023-11-14 LAB — MAGNESIUM: Magnesium: 2.2 mg/dL (ref 1.7–2.4)

## 2023-11-14 SURGERY — RIGHT/LEFT HEART CATH AND CORONARY ANGIOGRAPHY
Anesthesia: Moderate Sedation | Laterality: Bilateral

## 2023-11-14 MED ORDER — HEPARIN (PORCINE) IN NACL 1000-0.9 UT/500ML-% IV SOLN
INTRAVENOUS | Status: AC
  Filled 2023-11-14: qty 1000

## 2023-11-14 MED ORDER — HEPARIN SODIUM (PORCINE) 1000 UNIT/ML IJ SOLN
INTRAMUSCULAR | Status: AC
  Filled 2023-11-14: qty 10

## 2023-11-14 MED ORDER — HYDRALAZINE HCL 20 MG/ML IJ SOLN: 10.0000 mg | INTRAMUSCULAR | Status: AC | PRN

## 2023-11-14 MED ORDER — POTASSIUM CHLORIDE 10 MEQ/100ML IV SOLN
10.0000 meq | INTRAVENOUS | Status: AC
  Administered 2023-11-14 (×3): 10 meq via INTRAVENOUS
  Filled 2023-11-14 (×3): qty 100

## 2023-11-14 MED ORDER — MIDAZOLAM HCL 2 MG/2ML IJ SOLN
INTRAMUSCULAR | Status: AC
  Filled 2023-11-14: qty 2

## 2023-11-14 MED ORDER — FENTANYL CITRATE (PF) 100 MCG/2ML IJ SOLN
INTRAMUSCULAR | Status: DC | PRN
  Administered 2023-11-14: 25 ug via INTRAVENOUS

## 2023-11-14 MED ORDER — SODIUM CHLORIDE 0.9% FLUSH
3.0000 mL | Freq: Two times a day (BID) | INTRAVENOUS | Status: DC
  Administered 2023-11-15: 3 mL via INTRAVENOUS

## 2023-11-14 MED ORDER — VERAPAMIL HCL 2.5 MG/ML IV SOLN
INTRAVENOUS | Status: DC | PRN
  Administered 2023-11-14: 2.5 mg via INTRA_ARTERIAL

## 2023-11-14 MED ORDER — SODIUM CHLORIDE 0.9 % IV SOLN: INTRAVENOUS | Status: AC

## 2023-11-14 MED ORDER — SODIUM CHLORIDE 0.9 % IV SOLN: 250.0000 mL | INTRAVENOUS | Status: DC | PRN

## 2023-11-14 MED ORDER — LIDOCAINE HCL (PF) 1 % IJ SOLN
INTRAMUSCULAR | Status: DC | PRN
  Administered 2023-11-14: 10 mL

## 2023-11-14 MED ORDER — ORAL CARE MOUTH RINSE: 15.0000 mL | OROMUCOSAL | Status: DC | PRN

## 2023-11-14 MED ORDER — HEPARIN (PORCINE) IN NACL 1000-0.9 UT/500ML-% IV SOLN
INTRAVENOUS | Status: DC | PRN
  Administered 2023-11-14: 1000 mL

## 2023-11-14 MED ORDER — SODIUM CHLORIDE 0.9% FLUSH: 3.0000 mL | INTRAVENOUS | Status: DC | PRN

## 2023-11-14 MED ORDER — POTASSIUM CHLORIDE CRYS ER 20 MEQ PO TBCR
40.0000 meq | EXTENDED_RELEASE_TABLET | Freq: Once | ORAL | Status: AC
  Administered 2023-11-14: 40 meq via ORAL
  Filled 2023-11-14: qty 2

## 2023-11-14 MED ORDER — VERAPAMIL HCL 2.5 MG/ML IV SOLN
INTRAVENOUS | Status: AC
  Filled 2023-11-14: qty 2

## 2023-11-14 MED ORDER — LIDOCAINE HCL 1 % IJ SOLN
INTRAMUSCULAR | Status: AC
  Filled 2023-11-14: qty 20

## 2023-11-14 MED ORDER — ACETAMINOPHEN 325 MG PO TABS
650.0000 mg | ORAL_TABLET | ORAL | Status: DC | PRN
  Administered 2023-11-14: 650 mg via ORAL
  Filled 2023-11-14: qty 2

## 2023-11-14 MED ORDER — ONDANSETRON HCL 4 MG/2ML IJ SOLN: 4.0000 mg | Freq: Four times a day (QID) | INTRAMUSCULAR | Status: DC | PRN

## 2023-11-14 MED ORDER — IOHEXOL 300 MG/ML  SOLN
INTRAMUSCULAR | Status: DC | PRN
  Administered 2023-11-14: 89 mL

## 2023-11-14 MED ORDER — HEPARIN SODIUM (PORCINE) 1000 UNIT/ML IJ SOLN
INTRAMUSCULAR | Status: DC | PRN
  Administered 2023-11-14: 7000 [IU] via INTRAVENOUS

## 2023-11-14 MED ORDER — FENTANYL CITRATE (PF) 100 MCG/2ML IJ SOLN
INTRAMUSCULAR | Status: AC
Start: 2023-11-14 — End: ?
  Filled 2023-11-14: qty 2

## 2023-11-14 SURGICAL SUPPLY — 11 items
CATH INFINITI 5 FR JL3.5 (CATHETERS) IMPLANT
CATH INFINITI JR4 5F (CATHETERS) IMPLANT
CATH SWAN GANZ 7F STRAIGHT (CATHETERS) IMPLANT
DEVICE RAD COMP TR BAND LRG (VASCULAR PRODUCTS) IMPLANT
DRAPE BRACHIAL (DRAPES) IMPLANT
GLIDESHEATH SLEND SS 6F .021 (SHEATH) IMPLANT
GLIDESHEATH SLENDER 7FR .021G (SHEATH) IMPLANT
GUIDEWIRE INQWIRE 1.5J.035X260 (WIRE) IMPLANT
PACK CARDIAC CATH (CUSTOM PROCEDURE TRAY) ×1 IMPLANT
SET ATX-X65L (MISCELLANEOUS) IMPLANT
STATION PROTECTION PRESSURIZED (MISCELLANEOUS) IMPLANT

## 2023-11-14 NOTE — Progress Notes (Addendum)
 Advanced Heart Failure Rounding Note  Cardiologist: Belva Boyden, MD  Chief Complaint: Acute Systolic Heart Failure  Subjective:    Net negative 7.2L this admit, wt overall down 20+ lb (doubt today's wt accurate)  SCr/BUN bump w/ diuresis yesterday, 0.87/18>>1.00/22. SBPs 110s K 3.3 Mg 2.2  Awaiting RHC/LHC  this afternoon Resting comfortably this morning, breathing much improved. Back to baseline O2 requirements. No chest pain.   Mother at bedside.    Objective:   Weight Range: (!) 163.2 kg Body mass index is 53.15 kg/m.   Vital Signs:   Temp:  [97.7 F (36.5 C)-98.7 F (37.1 C)] 97.7 F (36.5 C) (04/22 0846) Pulse Rate:  [85-100] 90 (04/22 0846) Resp:  [18-20] 20 (04/21 2348) BP: (90-132)/(53-88) 109/88 (04/22 0846) SpO2:  [90 %-98 %] 90 % (04/22 0846) FiO2 (%):  [40 %] 40 % (04/21 2356) Weight:  [163.2 kg] 163.2 kg (04/22 0622) Last BM Date : 11/12/23  Weight change: Filed Weights   11/12/23 0557 11/13/23 0558 11/14/23 0622  Weight: (!) 177 kg (!) 177.7 kg (!) 163.2 kg    Intake/Output:   Intake/Output Summary (Last 24 hours) at 11/14/2023 0857 Last data filed at 11/13/2023 2000 Gross per 24 hour  Intake 960 ml  Output 1175 ml  Net -215 ml      Physical Exam    General:  super morbidly obese,  No resp difficulty HEENT: Normal Neck: Supple. Thick neck, JVD not well visualized . Carotids 2+ bilat; no bruits. No lymphadenopathy or thyromegaly appreciated. Cor: distant heart sounds due to body habitus, RRR  Lungs: distant breath sounds  Abdomen: obese, large panus  Extremities: No cyanosis, clubbing, rash, edema Neuro: Alert & orientedx3, cranial nerves grossly intact. moves all 4 extremities w/o difficulty. Affect pleasant   Telemetry   NSR 90s, personally reviewed   EKG    N/A   Labs    CBC No results for input(s): "WBC", "NEUTROABS", "HGB", "HCT", "MCV", "PLT" in the last 72 hours. Basic Metabolic Panel Recent Labs     11/13/23 0445 11/14/23 0500  NA 142 138  K 3.3* 3.3*  CL 97* 96*  CO2 33* 32  GLUCOSE 140* 146*  BUN 18 22*  CREATININE 0.87 1.00  CALCIUM  9.0 9.2   Liver Function Tests Recent Labs    11/13/23 0445  AST 106*  ALT 230*  ALKPHOS 43  BILITOT 0.6  PROT 6.9  ALBUMIN  3.2*   No results for input(s): "LIPASE", "AMYLASE" in the last 72 hours. Cardiac Enzymes No results for input(s): "CKTOTAL", "CKMB", "CKMBINDEX", "TROPONINI" in the last 72 hours.  BNP: BNP (last 3 results) Recent Labs    10/30/23 2348 10/31/23 2030  BNP 243.7* 129.1*    ProBNP (last 3 results) No results for input(s): "PROBNP" in the last 8760 hours.   D-Dimer No results for input(s): "DDIMER" in the last 72 hours. Hemoglobin A1C No results for input(s): "HGBA1C" in the last 72 hours. Fasting Lipid Panel Recent Labs    11/13/23 0445  CHOL 213*  HDL 45  LDLCALC 140*  TRIG 138  CHOLHDL 4.7   Thyroid Function Tests No results for input(s): "TSH", "T4TOTAL", "T3FREE", "THYROIDAB" in the last 72 hours.  Invalid input(s): "FREET3"  Other results:   Imaging    No results found.   Medications:     Scheduled Medications:  aspirin  EC  81 mg Oral Daily   atorvastatin   40 mg Oral Daily   budesonide  (PULMICORT ) nebulizer solution  0.25  mg Nebulization BID   dapagliflozin  propanediol  10 mg Oral Daily   enoxaparin  (LOVENOX ) injection  85 mg Subcutaneous Q24H   insulin  aspart  0-20 Units Subcutaneous TID WC   insulin  aspart  0-5 Units Subcutaneous QHS   insulin  glargine-yfgn  20 Units Subcutaneous BID   ipratropium-albuterol   3 mL Nebulization TID   metoprolol  succinate  25 mg Oral Daily   multivitamin with minerals  1 tablet Oral Daily   potassium chloride   40 mEq Oral Once   predniSONE   10 mg Oral Q breakfast   sacubitril -valsartan   1 tablet Oral BID   sodium chloride  flush  3 mL Intravenous Q12H   spironolactone   25 mg Oral Daily    Infusions:  sodium chloride  10 mL/hr at  11/14/23 0647    PRN Medications: acetaminophen , docusate, fentaNYL  (SUBLIMAZE ) injection, ipratropium-albuterol , melatonin, mouth rinse, polyethylene glycol, polyvinyl alcohol , simethicone     Patient Profile   43 y/o super morbidly obese male (Body mass index is 57.84 kg/m.) w/ chronic hypoxic respiratory failure secondary to [name] and severe OSA, on 4L [place] home O2, HTN and Type 2 DM admitted w/ acute on chronic hypoxic respiratory failure and septic shock 2/2 CAP and staph hominis bacteremia and acute systolic heart failure.   Assessment/Plan   1. Acute Systolic Heart Failure - Echo 5361 EF 50-55%, RV not well visualized - Echo this admit EF 35-40%, diffuse HK, RV mod reduced  - Etiology uncertain. ? PVC mediated/ hypertensive heart disease. He has multiple risk factors for CAD though no h/o ischemic CP. Hs trop this admit not c/w ACS.  - NYHA Class III, confounded by obesity/deconditioning but symptoms overall improved since admission. Obesity/body habitus makes volume assessment difficult. Has diuresed 20+ lb. SCr/BUN trending up. - Stop IV Lasix   - Plan R/LHC this afternoon  - Continue Entresto  24-26 mg bid. BP too soft for increase  - Continue Farxiga  10 mg daily, good hygiene will be important given body habitus   - Continue Spironolactone  25 mg daily  - Continue Toprol  XL 25 mg daily     2. Septic Shock  - 2/2 CAP and Bacteremia - resolved - completed abx - stable off IV pressors    3. A/c Hypoxic Respiratory Failure - [name]/OSA on 4L [place] home O2 at baseline  - required intubation in setting of PNA  - now extubated and completed abx, back to baseline O2 requirements     4. Severe OSA/[name]  - most recent sleep study 3/25 w/ severely elevated AHI of 211  - followed by Pulm (Dr. Gregary Lean) awaiting home BiPAP  - wt loss imperative. Needs GLP1, he has been approved for Mounjaro     5. 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 If  contributing to CM - frequent PVCs not noted this admit but can be variable. Recommend 2 wk outpatient Zio to quantify burden. If significant, may need Mexiletine to suppress until he receives his home Bipap  - needs BiPAP per above    6. AKI   - SCr 1.09 on admit >>peaked 2.4 w/ shock - now resolved, 1.0 today    7. Hypokalemia  - in setting of diuresis  - K 3.3, Mg 2.2  - give K supp - Continue Spironolactone    8. Type 2DM  - Hgb A1c 6.8 - now on Farxiga   - starting GLP1, Mounjaro     9. Morbid Obesity  - Body mass index  is 57.84 kg/m. - Start Mounjaro     10. HLD, LDL Goal < 70 - LDL elevated at 140 - atorva 40 mg daily  - repeat FLP in 6-8 wks     If R/LHC ok, anticipate d/c as early as this afternoon.   Length of Stay: [address] PA-C  11/14/2023, 8:57 AM  Advanced Heart Failure Team Pager [phone] (M-F; 7a - 5p)  Please contact [facility] for night-coverage after hours (5p -7a ) and weekends on [email or web address]  Patient seen and examined with the above-signed Advanced Practice Provider and/or Housestaff. I personally reviewed laboratory data, imaging studies and relevant notes. I independently examined the patient and formulated the important aspects of the plan. I have edited the note to reflect any of my changes or salient points. I have personally discussed the plan with the patient and/or family.  Denies CP or SOB. Awaiting cath. Hungry.  General:  obese male No resp difficulty HEENT: normal Neck: supple. Hard to see jvp  Cor: Distant HS. Regular  Lungs: clear Abdomen: obese soft, nontender, nondistended.Good bowel sounds. Extremities: no cyanosis, clubbing, rash, edema Neuro: alert & orientedx3, cranial nerves grossly intact. moves all 4 extremities w/o difficulty. Affect pleasant  Volume status much improved. With new drop in EF and abnormal ECG will pursue R/L cath today to guide further decision making.  All questions answered. Cath risks  discussed personally.   Jules Oar, MD  1:10 PM

## 2023-11-14 NOTE — TOC Progression Note (Addendum)
 Transition of Care [facility]) - Progression Note    Patient Details  Name: Clifford Nichols MRN: [id number] Date of Birth: 1980/12/07  Transition of [facility]) CM/SW Contact  Odilia Bennett, LCSW Phone Number: 11/14/2023, 12:02 PM  Clinical Narrative:  NIV has been approved. Per MD, likely discharge tomorrow. CSW notified [name] liaison so he can deliver bariatric BSC and shower chair.   5:03 pm: Bariatric shower chairs are on back order. Patient would prefer to wait for one to be delivered once in stock rather than getting a shower bench.   Expected Discharge Plan: Home w Home Health Services Barriers to Discharge: No Barriers Identified  Expected Discharge Plan and Services   Discharge Planning Services: CM Consult Post Acute Care Choice: Home Health Living arrangements for the past 2 months: Single Family Home                 DME Arranged: 3-N-1, Shower stool (Bariatric 3 in 1 and Bariatric shower chair) DME Agency: [employer] Date DME Agency Contacted: 11/07/23   Representative spoke with at DME Agency: [name] HH Arranged: RN, OT, PT [facility] Agency: [name] Date [facility] Agency Contacted: 11/07/23   Representative spoke with at [facility] Agency: [name]   Social Determinants of Health (SDOH) Interventions SDOH Screenings   Food Insecurity: Food Insecurity Present (11/08/2023)  Housing: Low Risk  (11/08/2023)  Transportation Needs: Unmet Transportation Needs (11/08/2023)  Utilities: Not At Risk (10/31/2023)  Financial Resource Strain: High Risk (11/08/2023)  Tobacco Use: Low Risk  (11/08/2023)    Readmission Risk Interventions     No data to display

## 2023-11-14 NOTE — Interval H&P Note (Signed)
 History and Physical Interval Note:  11/14/2023 1:11 PM  Clifford Nichols  has presented today for surgery, with the diagnosis of heart failure.  The various methods of treatment have been discussed with the patient and family. After consideration of risks, benefits and other options for treatment, the patient has consented to  Procedure(s): RIGHT/LEFT HEART CATH AND CORONARY ANGIOGRAPHY (Bilateral) as a surgical intervention.  The patient's history has been reviewed, patient examined, no change in status, stable for surgery.  I have reviewed the patient's chart and labs.  Questions were answered to the patient's satisfaction.     [name]

## 2023-11-14 NOTE — Progress Notes (Addendum)
 Progress Note   Patient: Clifford Nichols ZOX:[id number] DOB: 03-Apr-1981 DOA: 10/30/2023     14 DOS: the patient was seen and examined on 11/14/2023   Brief hospital course: Philippe Manthey is a 43 y.o with significant PMH of severe OSA, chronic respiraotry failure on 4L, extreme obesity with Alveolar hypoventilation, DM and HTN who presented to the ED with chief complaints of progressive shortness of breath.  He was admitted to stepdown unit on account of acute respiratory failure and required noninvasive positive pressure ventilation.  He failed treatment and hence was intubated for airway protection.  S/p extubation on 4/13//2025.  Clinically has remained stable postextubation.  Requiring 4l supplemental O2 and cpap at night. He is awaiting heart cath, NIV for home use and DME for discharge.  Assessment and Plan: Acute on chronic hypoxic and Hypercapnic Respiratory Failure Likely multifactorial in the setting of obesity hypoventilation syndrome, heart failure as well as severe bilateral pneumonia CTA chest negative for PE but consistent with an infectious process. Patient extubated on 11/05/2023. According to patient he uses 4 L of oxygen at home. He will need NIV which TOC is working on. Patient has completed antibiotic course Continue prednisone  taper next 2 days. Cardiology plan to do right and left heart cath today.   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 Patient requires frequent durations of ventilatory support. Intermittent usage is insufficient. TOC worked on NIV with [employer] to be delivered today. He will follow pulmonary as outpatient.   Sepsis with septic shock due to suspected pneumonia Staph Hominis bacteremia. Has completed antibiotic course Monitor respiratory function as above   Mildly Elevated Troponin, suspect demand ischemia from respiratory failure. Continue heart failure meds as  shown below.   Hypokalemia- K 2.7 today will give IV and oral replacements.  Discharge held for replacement and recheck.   Acute systolic heart failure with EF 35% Cardiomyopathy and pulmonary hypertension. Monitor daily weight. Monitor input and output Able to tolerate GDMT continue toprol , Farxiga , spironolactone , Entresto .  Heart failure team advised right and left heart cath which is planned for today.   Acute renal Failure-improved Monitor renal function closely Avoid nephrotoxic agents   Diabetes mellitus type 2 Monitor glucose closely Continue insulin  therapy with sliding scale. Carb consistent diet.   Morbid obesity with BMI 58.97 Counseled on weight loss with diet and exercise when medically stable Weight reduction strongly encouraged.       Out of bed to chair. Incentive spirometry. Nursing supportive care. Fall, aspiration precautions. Diet:  Diet Orders (From admission, onward)     Start     Ordered   11/14/23 1427  Diet Heart Room service appropriate? Yes; Fluid consistency: Thin  Diet effective now       Question Answer Comment  Room service appropriate? Yes   Fluid consistency: Thin      11/14/23 1426           DVT prophylaxis:   Level of care: Progressive   Code Status: Full Code  Subjective: Patient is seen and examined today morning. He is lying in bed. Eating fair. Currently on 4L supplemental o2. Denies any complaints. Going for cath at 1pm.   Physical Exam: Vitals:   11/14/23 1500 11/14/23 1515 11/14/23 1530 11/14/23 1545  BP: 118/79 123/87 (!) 134/93 134/89  Pulse: 84 83 85 78  Resp: 15 (!) 21 17 20   Temp:      TempSrc:  SpO2: 95% 94% 95% 94%  Weight:      Height:        General - [place]  morbidly obese African American male, mild respiratory distress HEENT - PERRLA, EOMI, atraumatic head, non tender sinuses. Lung - Clear, basal rales, rhonchi, no wheezes. Heart - S1, S2 heard, no murmurs, rubs, 1+ pedal edema. Abdomen -  Soft, non tender, obese, bowel sounds good Neuro - Alert, awake and oriented x 3, non focal exam. Skin - Warm and dry.  Data Reviewed:      Latest Ref Rng & Units 11/14/2023    1:41 PM 11/14/2023    1:40 PM 11/14/2023    1:35 PM  CBC  Hemoglobin 13.0 - 17.0 g/dL 78.2  95.6  21.3   Hematocrit 39.0 - 52.0 % 43.0  49.0  44.0       Latest Ref Rng & Units 11/14/2023    1:41 PM 11/14/2023    1:40 PM 11/14/2023    1:35 PM  BMP  Sodium 135 - 145 mmol/L 139  140  144   Potassium 3.5 - 5.1 mmol/L 2.7  3.6  3.0    CARDIAC CATHETERIZATION Result Date: 11/14/2023   Ost LAD to Prox LAD lesion is 20% stenosed. Findings: Ao = 99/71 (83) LV = 110/3 RA =  4 RV = 28/4 PA = 31/20 (24) PCW = 3 Fick cardiac output/index = 6.2/2.4 Thermo CO/CI = 7.5/2.8 PVR = 3.4 (Fick) 2.8 (TD) Ao sat = 88% PA sat = 62%, 62% PAPi = 2.7 Assessment: 1. Minimal CAD 2. Well compensated hemodynamics with low filling pressures 3. EF hard to assess on LV gram due to PVC. EF doesn't look too bad Plan/Discussion: He is stable for d/c table. Filling pressures are low. Would hold diuretics for now and use on PRN basis. OK for d/c later today. Jules Oar, MD 2:13 PM    Family Communication: Discussed with patient, mother at bedside. They understand and agree. All questions answered.  Disposition: Status is: Inpatient Remains inpatient appropriate because: Heart cath, need NIV, DME prior to dc.  Planned Discharge Destination: Home with Home Health Discharge held as his potassium is very low and need IV replacements.  Time spent: 40 minutes  Author: Aisha Hove, MD 11/14/2023 4:01 PM Secure chat 7am to 7pm For on call review [email or web address].

## 2023-11-14 NOTE — H&P (View-Only) (Signed)
 Advanced Heart Failure Rounding Note  Cardiologist: Belva Boyden, MD  Chief Complaint: Acute Systolic Heart Failure  Subjective:    Net negative 7.2L this admit, wt overall down 20+ lb (doubt today's wt accurate)  SCr/BUN bump w/ diuresis yesterday, 0.87/18>>1.00/22. SBPs 110s K 3.3 Mg 2.2  Awaiting RHC/LHC  this afternoon Resting comfortably this morning, breathing much improved. Back to baseline O2 requirements. No chest pain.   Mother at bedside.    Objective:   Weight Range: (!) 163.2 kg Body mass index is 53.15 kg/m.   Vital Signs:   Temp:  [97.7 F (36.5 C)-98.7 F (37.1 C)] 97.7 F (36.5 C) (04/22 0846) Pulse Rate:  [85-100] 90 (04/22 0846) Resp:  [18-20] 20 (04/21 2348) BP: (90-132)/(53-88) 109/88 (04/22 0846) SpO2:  [90 %-98 %] 90 % (04/22 0846) FiO2 (%):  [40 %] 40 % (04/21 2356) Weight:  [163.2 kg] 163.2 kg (04/22 0622) Last BM Date : 11/12/23  Weight change: Filed Weights   11/12/23 0557 11/13/23 0558 11/14/23 0622  Weight: (!) 177 kg (!) 177.7 kg (!) 163.2 kg    Intake/Output:   Intake/Output Summary (Last 24 hours) at 11/14/2023 0857 Last data filed at 11/13/2023 2000 Gross per 24 hour  Intake 960 ml  Output 1175 ml  Net -215 ml      Physical Exam    General:  super morbidly obese,  No resp difficulty HEENT: Normal Neck: Supple. Thick neck, JVD not well visualized . Carotids 2+ bilat; no bruits. No lymphadenopathy or thyromegaly appreciated. Cor: distant heart sounds due to body habitus, RRR  Lungs: distant breath sounds  Abdomen: obese, large panus  Extremities: No cyanosis, clubbing, rash, edema Neuro: Alert & orientedx3, cranial nerves grossly intact. moves all 4 extremities w/o difficulty. Affect pleasant   Telemetry   NSR 90s, personally reviewed   EKG    N/A   Labs    CBC No results for input(s): "WBC", "NEUTROABS", "HGB", "HCT", "MCV", "PLT" in the last 72 hours. Basic Metabolic Panel Recent Labs     11/13/23 0445 11/14/23 0500  NA 142 138  K 3.3* 3.3*  CL 97* 96*  CO2 33* 32  GLUCOSE 140* 146*  BUN 18 22*  CREATININE 0.87 1.00  CALCIUM  9.0 9.2   Liver Function Tests Recent Labs    11/13/23 0445  AST 106*  ALT 230*  ALKPHOS 43  BILITOT 0.6  PROT 6.9  ALBUMIN  3.2*   No results for input(s): "LIPASE", "AMYLASE" in the last 72 hours. Cardiac Enzymes No results for input(s): "CKTOTAL", "CKMB", "CKMBINDEX", "TROPONINI" in the last 72 hours.  BNP: BNP (last 3 results) Recent Labs    10/30/23 2348 10/31/23 2030  BNP 243.7* 129.1*    ProBNP (last 3 results) No results for input(s): "PROBNP" in the last 8760 hours.   D-Dimer No results for input(s): "DDIMER" in the last 72 hours. Hemoglobin A1C No results for input(s): "HGBA1C" in the last 72 hours. Fasting Lipid Panel Recent Labs    11/13/23 0445  CHOL 213*  HDL 45  LDLCALC 140*  TRIG 138  CHOLHDL 4.7   Thyroid Function Tests No results for input(s): "TSH", "T4TOTAL", "T3FREE", "THYROIDAB" in the last 72 hours.  Invalid input(s): "FREET3"  Other results:   Imaging    No results found.   Medications:     Scheduled Medications:  aspirin  EC  81 mg Oral Daily   atorvastatin   40 mg Oral Daily   budesonide  (PULMICORT ) nebulizer solution  0.25  mg Nebulization BID   dapagliflozin  propanediol  10 mg Oral Daily   enoxaparin  (LOVENOX ) injection  85 mg Subcutaneous Q24H   insulin  aspart  0-20 Units Subcutaneous TID WC   insulin  aspart  0-5 Units Subcutaneous QHS   insulin  glargine-yfgn  20 Units Subcutaneous BID   ipratropium-albuterol   3 mL Nebulization TID   metoprolol  succinate  25 mg Oral Daily   multivitamin with minerals  1 tablet Oral Daily   potassium chloride   40 mEq Oral Once   predniSONE   10 mg Oral Q breakfast   sacubitril -valsartan   1 tablet Oral BID   sodium chloride  flush  3 mL Intravenous Q12H   spironolactone   25 mg Oral Daily    Infusions:  sodium chloride  10 mL/hr at  11/14/23 0647    PRN Medications: acetaminophen , docusate, fentaNYL  (SUBLIMAZE ) injection, ipratropium-albuterol , melatonin, mouth rinse, polyethylene glycol, polyvinyl alcohol , simethicone     Patient Profile   43 y/o super morbidly obese male (Body mass index is 57.84 kg/m.) w/ 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   Assessment/Plan   1. Acute Systolic Heart Failure - Echo 1610 EF 50-55%, RV not well visualized - Echo this admit EF 35-40%, diffuse HK, RV mod reduced  - Etiology uncertain. ? PVC mediated/ hypertensive heart disease. He has multiple risk factors for CAD though no h/o ischemic CP. Hs trop this admit not c/w ACS.  - NYHA Class III, confounded by obesity/deconditioning but symptoms overall improved since admission. Obesity/body habitus makes volume assessment difficult. Has diuresed 20+ lb. SCr/BUN trending up. - Stop IV Lasix   - Plan R/LHC this afternoon  - Continue Entresto  24-26 mg bid. BP too soft for increase  - Continue Farxiga  10 mg daily, good hygiene will be important given body habitus   - Continue Spironolactone  25 mg daily  - Continue Toprol  XL 25 mg daily     2. Septic Shock  - 2/2 CAP and Bacteremia - resolved - completed abx - stable off IV pressors    3. A/c Hypoxic Respiratory Failure - [name]/OSA on 4L [place] home O2 at baseline  - required intubation in setting of PNA  - now extubated and completed abx, back to baseline O2 requirements     4. Severe OSA/[name]  - most recent sleep study 3/25 w/ severely elevated AHI of 211  - followed by Pulm (Dr. Gregary Lean) awaiting home BiPAP  - wt loss imperative. Needs GLP1, he has been approved for Mounjaro     5. 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 If  contributing to CM - frequent PVCs not noted this admit but can be variable. Recommend 2 wk outpatient Zio to quantify burden. If significant, may need Mexiletine to suppress until he receives his home Bipap  - needs BiPAP per above    6. AKI   - SCr 1.09 on admit >>peaked 2.4 w/ shock - now resolved, 1.0 today    7. Hypokalemia  - in setting of diuresis  - K 3.3, Mg 2.2  - give K supp - Continue Spironolactone    8. Type 2DM  - Hgb A1c 6.8 - now on Farxiga   - starting GLP1, Mounjaro     9. Morbid Obesity  - Body mass index  is 57.84 kg/m. - Start Mounjaro     10. HLD, LDL Goal < 70 - LDL elevated at 140 - atorva 40 mg daily  - repeat FLP in 6-8 wks     If R/LHC ok, anticipate d/c as early as this afternoon.   Length of Stay: [address], PA-C  11/14/2023, 8:57 AM  Advanced Heart Failure Team Pager [phone] (M-F; 7a - 5p)  Please contact [facility] for night-coverage after hours (5p -7a ) and weekends on [email or web address]  Patient seen and examined with the above-signed Advanced Practice Provider and/or Housestaff. I personally reviewed laboratory data, imaging studies and relevant notes. I independently examined the patient and formulated the important aspects of the plan. I have edited the note to reflect any of my changes or salient points. I have personally discussed the plan with the patient and/or family.  Denies CP or SOB. Awaiting cath. Hungry.  General:  obese male No resp difficulty HEENT: normal Neck: supple. Hard to see jvp  Cor: Distant HS. Regular  Lungs: clear Abdomen: obese soft, nontender, nondistended.Good bowel sounds. Extremities: no cyanosis, clubbing, rash, edema Neuro: alert & orientedx3, cranial nerves grossly intact. moves all 4 extremities w/o difficulty. Affect pleasant  Volume status much improved. With new drop in EF and abnormal ECG will pursue R/L cath today to guide further decision making.  All questions answered. Cath risks  discussed personally.   Jules Oar, MD  1:10 PM

## 2023-11-15 ENCOUNTER — Other Ambulatory Visit (HOSPITAL_COMMUNITY): Payer: Self-pay

## 2023-11-15 ENCOUNTER — Telehealth (HOSPITAL_COMMUNITY): Payer: Self-pay | Admitting: Pharmacy Technician

## 2023-11-15 ENCOUNTER — Encounter: Payer: Self-pay | Admitting: Internal Medicine

## 2023-11-15 ENCOUNTER — Other Ambulatory Visit: Payer: Self-pay

## 2023-11-15 DIAGNOSIS — I5043 Acute on chronic combined systolic (congestive) and diastolic (congestive) heart failure: Secondary | ICD-10-CM

## 2023-11-15 DIAGNOSIS — I420 Dilated cardiomyopathy: Secondary | ICD-10-CM

## 2023-11-15 DIAGNOSIS — E662 Morbid (severe) obesity with alveolar hypoventilation: Secondary | ICD-10-CM

## 2023-11-15 LAB — BASIC METABOLIC PANEL WITH GFR
Anion gap: 10 (ref 5–15)
BUN: 21 mg/dL — ABNORMAL HIGH (ref 6–20)
CO2: 29 mmol/L (ref 22–32)
Calcium: 9.2 mg/dL (ref 8.9–10.3)
Chloride: 102 mmol/L (ref 98–111)
Creatinine, Ser: 0.9 mg/dL (ref 0.61–1.24)
GFR, Estimated: >60 mL/min (ref >60)
Glucose, Bld: 138 mg/dL — ABNORMAL HIGH (ref 70–99)
Potassium: 3.9 mmol/L (ref 3.5–5.1)
Sodium: 141 mmol/L (ref 135–145)

## 2023-11-15 LAB — GLUCOSE, CAPILLARY: Glucose-Capillary: 148 mg/dL — ABNORMAL HIGH (ref 70–99)

## 2023-11-15 MED ORDER — METOPROLOL SUCCINATE ER 25 MG PO TB24
25.0000 mg | ORAL_TABLET | Freq: Every day | ORAL | 0 refills | Status: DC
  Filled 2023-11-15: qty 90, 90d supply, fill #0

## 2023-11-15 MED ORDER — POTASSIUM CHLORIDE CRYS ER 20 MEQ PO TBCR
20.0000 meq | EXTENDED_RELEASE_TABLET | Freq: Once | ORAL | Status: AC
  Administered 2023-11-15: 20 meq via ORAL
  Filled 2023-11-15: qty 1

## 2023-11-15 MED ORDER — POTASSIUM CHLORIDE CRYS ER 20 MEQ PO TBCR
20.0000 meq | EXTENDED_RELEASE_TABLET | Freq: Two times a day (BID) | ORAL | 0 refills | Status: DC
  Filled 2023-11-15: qty 10, 5d supply, fill #0

## 2023-11-15 MED ORDER — MOUNJARO 12.5 MG/0.5ML ~~LOC~~ SOAJ
12.5000 mg | SUBCUTANEOUS | 0 refills | Status: DC
  Filled 2023-11-15: qty 2, 28d supply, fill #0

## 2023-11-15 MED ORDER — SPIRONOLACTONE 25 MG PO TABS
25.0000 mg | ORAL_TABLET | Freq: Every day | ORAL | 0 refills | Status: DC
  Filled 2023-11-15: qty 90, 90d supply, fill #0

## 2023-11-15 MED ORDER — BUDESONIDE 0.25 MG/2ML IN SUSP
0.2500 mg | Freq: Two times a day (BID) | RESPIRATORY_TRACT | 12 refills | Status: DC
  Filled 2023-11-15 (×2): qty 60, 15d supply, fill #0

## 2023-11-15 MED ORDER — SACUBITRIL-VALSARTAN 24-26 MG PO TABS
1.0000 | ORAL_TABLET | Freq: Two times a day (BID) | ORAL | 0 refills | Status: DC
  Filled 2023-11-15: qty 180, 90d supply, fill #0

## 2023-11-15 MED ORDER — MOUNJARO 10 MG/0.5ML ~~LOC~~ SOAJ
10.0000 mg | SUBCUTANEOUS | 0 refills | Status: DC
  Filled 2023-11-15: qty 2, 28d supply, fill #0

## 2023-11-15 MED ORDER — DAPAGLIFLOZIN PROPANEDIOL 10 MG PO TABS
10.0000 mg | ORAL_TABLET | Freq: Every day | ORAL | 0 refills | Status: DC
  Filled 2023-11-15: qty 90, 90d supply, fill #0

## 2023-11-15 MED ORDER — ATORVASTATIN CALCIUM 40 MG PO TABS
40.0000 mg | ORAL_TABLET | Freq: Every day | ORAL | 0 refills | Status: DC
  Filled 2023-11-15: qty 90, 90d supply, fill #0

## 2023-11-15 MED ORDER — IPRATROPIUM-ALBUTEROL 0.5-2.5 (3) MG/3ML IN SOLN
3.0000 mL | Freq: Four times a day (QID) | RESPIRATORY_TRACT | 0 refills | Status: DC | PRN
  Filled 2023-11-15: qty 360, 30d supply, fill #0

## 2023-11-15 MED ORDER — ASPIRIN 81 MG PO TBEC
81.0000 mg | DELAYED_RELEASE_TABLET | Freq: Every day | ORAL | 12 refills | Status: AC
  Filled 2023-11-15: qty 30, 30d supply, fill #0
  Filled 2023-12-14: qty 30, 30d supply, fill #1
  Filled 2024-01-29: qty 30, 30d supply, fill #2

## 2023-11-15 NOTE — Discharge Summary (Signed)
 Physician Discharge Summary   Patient: Clifford Nichols MRN: [id number] DOB: 06-04-1981  Admit date:     10/30/2023  Discharge date: 11/15/23  Discharge Physician: Aisha Hove   PCP: Dionicia Frater, MD   Recommendations at discharge:    PCP follow up in 1 week. Pulmonary follow up and heart failure clinic follow up as scheduled.  Discharge Diagnoses: Principal Problem:   Acute on chronic respiratory failure with hypoxia and hypercapnia (HCC) Active Problems:   Acute respiratory failure with hypoxia and hypercapnia (HCC)   Acute sepsis (HCC)   Septic shock (HCC)   Dilated cardiomyopathy (HCC)   Obesity hypoventilation syndrome (HCC)   Sleep apnea   Acute on chronic combined systolic and diastolic CHF (congestive heart failure) (HCC)  Resolved Problems:   * No resolved hospital problems. *  Hospital Course: Clifford Nichols is a 43 y.o with significant PMH of severe OSA, chronic respiraotry failure on 4L, extreme obesity with Alveolar hypoventilation, DM and HTN who presented to the ED with chief complaints of progressive shortness of breath.  He was admitted to stepdown unit on account of acute respiratory failure and required noninvasive positive pressure ventilation.  He failed treatment and hence was intubated for airway protection.  S/p extubation on 4/13//2025.  Clinically has remained stable postextubation.  Requiring 4l supplemental O2 and cpap at night. He had heart cath negative for obstructive disease. He has NIV delivered to home along with DME upon discharge.   Assessment and Plan: Acute on chronic hypoxic and Hypercapnic Respiratory Failure Likely multifactorial in the setting of obesity hypoventilation syndrome, heart failure as well as severe bilateral pneumonia CTA chest negative for PE but consistent with an infectious process. Patient extubated on 11/05/2023. According to patient he uses 4 L of oxygen at home as needed. He will need NIV which TOC is worked  on. Patient has completed antibiotic course, finished prednisone  taper. Cardiology and heart failure team on board. He did get right and left heart cath which is unremarkable for CAD. Advised to follow up with pulmonary, heart failure clinic upon discharge. He understands and agrees.   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 Patient requires frequent durations of ventilatory support. Intermittent usage is insufficient. TOC worked on NIV delivered bu adapt. He will follow pulmonary as outpatient.   Sepsis with septic shock due to suspected pneumonia Staph Hominis bacteremia. Has completed antibiotic course Vitals stable.    Mildly Elevated Troponin, suspect demand ischemia from respiratory failure. Continue heart failure meds as shown below. Heart cath unremarkable.    Hypokalemia- K improved with IV and oral replacements. Supplements for next 5 days ordered. He will continue Entresto , aldactone  which will help his K level.   Acute systolic heart failure with EF 35% Cardiomyopathy and pulmonary hypertension. Monitor daily weight. Monitor input and output Able to tolerate GDMT. Heart failure team advised right and left heart cath which was done 11/14/23, no obstructive etiology. He will follow up with Heart failure clinic as scheduled. Continue toprol , Farxiga , spironolactone , Entresto . Trulicity changed to Mounajro. Scripts sent to our pharmacy.   Acute renal Failure-improved Monitor renal function closely Avoid nephrotoxic agents   Diabetes mellitus type 2 Monitor glucose closely Continue home dose insulin , metformin  upon discharge. Advised heart healthy, Carb consistent diet.   Morbid obesity with BMI 58.97 Counseled on weight loss with diet and exercise when medically stable Weight reduction strongly encouraged.  Consultants: Cardiology, heart failure Procedures  performed: right and left heart cath.  Disposition: Home Diet recommendation:  Discharge Diet Orders (From admission, onward)     Start     Ordered   11/15/23 0000  Diet - low sodium heart healthy        11/15/23 1033   11/15/23 0000  Diet Carb Modified        11/15/23 1033           Cardiac and Carb modified diet DISCHARGE MEDICATION: Allergies as of 11/15/2023   No Known Allergies      Medication List     STOP taking these medications    amLODipine 10 MG tablet Commonly known as: NORVASC   lisinopril 20 MG tablet Commonly known as: ZESTRIL   Trulicity 4.5 MG/0.5ML Soaj Generic drug: Dulaglutide       TAKE these medications    aspirin  EC 81 MG tablet Take 1 tablet (81 mg total) by mouth daily. Swallow whole. Start taking on: November 16, 2023   atorvastatin  40 MG tablet Commonly known as: LIPITOR Take 1 tablet (40 mg total) by mouth daily. Start taking on: November 16, 2023   budesonide  0.25 MG/2ML nebulizer solution Commonly known as: PULMICORT  Take 2 mLs (0.25 mg total) by nebulization 2 (two) times daily.   Entresto  24-26 MG Generic drug: sacubitril -valsartan  Take 1 tablet by mouth 2 (two) times daily.   Farxiga  10 MG Tabs tablet Generic drug: dapagliflozin  propanediol Take 1 tablet (10 mg total) by mouth daily. Start taking on: November 16, 2023   ipratropium-albuterol  0.5-2.5 (3) MG/3ML Soln Commonly known as: DUONEB Take 3 mLs by nebulization every 6 (six) hours as needed.   metFORMIN 1000 MG tablet Commonly known as: GLUCOPHAGE Take 1,000 mg by mouth 2 (two) times daily.   metoprolol  succinate 25 MG 24 hr tablet Commonly known as: TOPROL -XL Take 1 tablet (25 mg total) by mouth daily. Start taking on: November 16, 2023   Mounjaro  12.5 MG/0.5ML Pen Generic drug: tirzepatide  Inject 12.5 mg into the skin once a week.   potassium chloride  SA 20 MEQ tablet Commonly known as: KLOR-CON  M Take 1 tablet (20 mEq total) by mouth 2 (two) times daily  for 5 days.   spironolactone  25 MG tablet Commonly known as: ALDACTONE  Take 1 tablet (25 mg total) by mouth daily. Start taking on: November 16, 2023               Durable Medical Equipment  (From admission, onward)           Start     Ordered   11/07/23 1606  For home use only DME Other see comment  Once       Comments: Bariatric shower chair  Question:  Length of Need  Answer:  Lifetime   11/07/23 1606   11/07/23 1221  For home use only DME Bedside commode  Once       Comments: Bariatric bedside commode  Question:  Patient needs a bedside commode to treat with the following condition  Answer:  Ambulatory dysfunction   11/07/23 1221            Follow-up Information     [facility]. Go on 11/20/2023.   Specialty: Cardiology Why: Hospital Follow-Up 11/20/23 @ 3:45AM Please bring weight log and all medications to follow-up appointment with you [facility], [address] [employer] Parking the door. Contact information: [phone]  [place]  [address] [phone]               Discharge Exam: Filed Weights   11/13/23 0558 11/14/23 0622 11/15/23 0500  Weight: (!) 177.7 kg (!) 163.2 kg (!) 169.2 kg      11/15/2023    7:53 AM 11/15/2023    5:00 AM 11/15/2023    3:06 AM  Vitals with BMI  Weight  373 lbs   BMI  55.06   Systolic 119  138  Diastolic 75  72  Pulse 86  76   General - [place]  morbidly obese African American male, mild respiratory distress HEENT - PERRLA, EOMI, atraumatic head, non tender sinuses. Lung - Clear, basal rales, rhonchi, no wheezes. Heart - S1, S2 heard, no murmurs, rubs, 1+ pedal edema. Abdomen - Soft, non tender, obese, bowel sounds good Neuro - Alert, awake and oriented x 3, non focal exam. Skin - Warm and dry.  Condition at discharge: stable  The results of significant diagnostics from this hospitalization (including imaging,  microbiology, ancillary and laboratory) are listed below for reference.   Imaging Studies: CARDIAC CATHETERIZATION Result Date: 11/14/2023   Ost LAD to Prox LAD lesion is 20% stenosed. Findings: Ao = 99/71 (83) LV = 110/3 RA =  4 RV = 28/4 PA = 31/20 (24) PCW = 3 Fick cardiac output/index = 6.2/2.4 Thermo CO/CI = 7.5/2.8 PVR = 3.4 (Fick) 2.8 (TD) Ao sat = 88% PA sat = 62%, 62% PAPi = 2.7 Assessment: 1. Minimal CAD 2. Well compensated hemodynamics with low filling pressures 3. EF hard to assess on LV gram due to PVC. EF doesn't look too bad Plan/Discussion: He is stable for d/c table. Filling pressures are low. Would hold diuretics for now and use on PRN basis. OK for d/c later today. Jules Oar, MD 2:13 PM   DG Chest Port 1 View Result Date: 11/04/2023 CLINICAL DATA:  Respiratory failure. EXAM: PORTABLE CHEST 1 VIEW COMPARISON:  11/01/2023 FINDINGS: Low volume under penetrated film. The cardio pericardial silhouette is enlarged. Endotracheal tube tip is approximately 4.6 cm above the base of the carina. NG tube cannot be visualized below the level of the carina due to technique on this study. Right PICC line tip projects in the region of the low right innominate vein. Vascular congestion noted with probable retrocardiac collapse/consolidation. No substantial pleural effusion. IMPRESSION: 1. Low volume under penetrated film with vascular congestion and probable retrocardiac collapse/consolidation. 2. Endotracheal tube tip is approximately 4.6 cm above the base of the carina. 3. NG tube cannot be visualized below the mid to lower mediastinum and may be in the distal esophagus. Repeat chest x-ray with increased penetration may prove helpful to further evaluate. Abdominal film could also be used to assess for NG tube placement. 4. Right PICC line tip projects in the region of the low right innominate vein. Electronically Signed   By: Donnal Fusi M.D.   On: 11/04/2023 08:43   ECHOCARDIOGRAM  COMPLETE Result Date: 11/02/2023    ECHOCARDIOGRAM REPORT   Patient Name:   Clifford Nichols Date of Exam: 10/31/2023 Medical Rec #:  [id number]      Height:       69.0 in Accession #:    [id number]     Weight:       414.9 lb Date of Birth:  October 08, 1980       BSA:          2.817 m Patient Age:    42 years  BP:           70/56 mmHg Patient Gender: M              HR:           74 bpm. Exam Location:  [facility] Procedure: 2D Echo, Cardiac Doppler, Color Doppler and Intracardiac            Opacification Agent (Both Spectral and Color Flow Doppler were            utilized during procedure). Indications:     R06.03 Acute Respiratory Distress  History:         Patient has no prior history of Echocardiogram examinations.                  Obstructive sleep apnea. Obesity Hypoventilation Syndrome.  Sonographer:     Brigid Canada RDCS Referring Phys:  [id number] Domingo Friend Diagnosing Phys: Belva Boyden MD  Sonographer Comments: Technically difficult study due to poor echo windows and patient is obese. IMPRESSIONS  1. Left ventricular ejection fraction, by estimation, is 35 to 40%. The left ventricle has moderately decreased function. The left ventricle demonstrates global hypokinesis. There is mild left ventricular hypertrophy. Left ventricular diastolic parameters are indeterminate.  2. Right ventricular systolic function is moderately reduced. The right ventricular size is moderately enlarged. There is normal pulmonary artery systolic pressure. The estimated right ventricular systolic pressure is 22.0 mmHg.  3. The mitral valve is normal in structure. No evidence of mitral valve regurgitation. No evidence of mitral stenosis.  4. The aortic valve has an indeterminant number of cusps. Aortic valve regurgitation is not visualized. No aortic stenosis is present.  5. The inferior vena cava is normal in size with greater than 50% respiratory variability, suggesting right atrial pressure of 3 mmHg. FINDINGS  Left Ventricle:  Left ventricular ejection fraction, by estimation, is 35 to 40%. The left ventricle has moderately decreased function. The left ventricle demonstrates global hypokinesis. Definity  contrast agent was given IV to delineate the left ventricular endocardial borders. Strain was performed and the global longitudinal strain is indeterminate. The left ventricular internal cavity size was normal in size. There is mild left ventricular hypertrophy. Left ventricular diastolic parameters are  indeterminate. Right Ventricle: The right ventricular size is moderately enlarged. No increase in right ventricular wall thickness. Right ventricular systolic function is moderately reduced. There is normal pulmonary artery systolic pressure. The tricuspid regurgitant velocity is 2.06 m/s, and with an assumed right atrial pressure of 5 mmHg, the estimated right ventricular systolic pressure is 22.0 mmHg. Left Atrium: Left atrial size was normal in size. Right Atrium: Right atrial size was normal in size. Pericardium: There is no evidence of pericardial effusion. Mitral Valve: The mitral valve is normal in structure. There is mild calcification of the mitral valve leaflet(s). No evidence of mitral valve regurgitation. No evidence of mitral valve stenosis. Tricuspid Valve: The tricuspid valve is normal in structure. Tricuspid valve regurgitation is mild . No evidence of tricuspid stenosis. Aortic Valve: The aortic valve has an indeterminant number of cusps. Aortic valve regurgitation is not visualized. No aortic stenosis is present. Pulmonic Valve: The pulmonic valve was normal in structure. Pulmonic valve regurgitation is not visualized. No evidence of pulmonic stenosis. Aorta: The aortic root is normal in size and structure. Venous: The inferior vena cava is normal in size with greater than 50% respiratory variability, suggesting right atrial pressure of 3 mmHg. IAS/Shunts: No atrial level shunt detected by color flow Doppler.  Additional  Comments: 3D was performed not requiring image post processing on an independent workstation and was indeterminate.  LEFT VENTRICLE PLAX 2D LVIDd:         5.20 cm   Diastology LVIDs:         3.80 cm   LV e' medial:    6.36 cm/s LV PW:         1.30 cm   LV E/e' medial:  8.3 LV IVS:        1.20 cm   LV e' lateral:   8.75 cm/s LVOT diam:     2.20 cm   LV E/e' lateral: 6.0 LVOT Area:     3.80 cm  RIGHT VENTRICLE RV Basal diam:  4.80 cm RV S prime:     12.13 cm/s TAPSE (M-mode): 2.1 cm LEFT ATRIUM           Index        RIGHT ATRIUM           Index LA diam:      4.70 cm 1.67 cm/m   RA Area:     13.60 cm LA Vol (A4C): 55.7 ml 19.77 ml/m  RA Volume:   39.90 ml  14.16 ml/m   AORTA Ao Root diam: 3.20 cm Ao Asc diam:  3.00 cm MITRAL VALVE               TRICUSPID VALVE MV Area (PHT): 3.81 cm    TR Peak grad:   17.0 mmHg MV Decel Time: 199 msec    TR Vmax:        206.00 cm/s MV E velocity: 52.90 cm/s MV A velocity: 29.60 cm/s  SHUNTS MV E/A ratio:  1.79        Systemic Diam: 2.20 cm Belva Boyden MD Electronically signed by Belva Boyden MD Signature Date/Time: 11/02/2023/11:07:19 AM    Final    DG Chest Port 1 View Result Date: 11/01/2023 CLINICAL DATA:  Status post PICC line placement. EXAM: PORTABLE CHEST 1 VIEW COMPARISON:  10/31/2023 FINDINGS: The endotracheal tube tip is approximately 4.5 cm above the carina. There is a new right IJ catheter with tip in the projection of the SVC. No pneumothorax identified. No PICC line identified. Enteric tube tip courses below the level of the GE junction. Cardiac enlargement. Atelectasis/consolidation is again noted in both lung bases. IMPRESSION: 1. New right IJ catheter with tip in the projection of the SVC. No pneumothorax. 2. No PICC line identified. 3. Persistent bibasilar atelectasis/consolidation. Electronically Signed   By: Kimberley Penman M.D.   On: 11/01/2023 06:55   DG Chest Port 1 View Result Date: 10/31/2023 CLINICAL DATA:  Endotracheal tube present. EXAM:  PORTABLE CHEST 1 VIEW COMPARISON:  Earlier today FINDINGS: Endotracheal tube tip is at the level of the clavicular heads approximately 5.4 cm from the carina. Lung volumes are low with increasing bibasilar volume loss. Enteric tube is faintly visualized to the level of the lower chest. Cardiomegaly is grossly unchanged. IMPRESSION: 1. Endotracheal tube tip at the level of the clavicular heads approximately 5.4 cm from the carina. 2. Low lung volumes with increasing bibasilar volume loss. Electronically Signed   By: Chadwick Colonel M.D.   On: 10/31/2023 18:31   CT Angio Chest PE W and/or Wo Contrast Result Date: 10/31/2023 CLINICAL DATA:  Shortness of breath. EXAM: CT ANGIOGRAPHY CHEST WITH CONTRAST TECHNIQUE: Multidetector CT imaging of the chest was performed using the standard protocol during bolus administration of intravenous contrast. Multiplanar CT  image reconstructions and MIPs were obtained to evaluate the vascular anatomy. RADIATION DOSE REDUCTION: This exam was performed according to the departmental dose-optimization program which includes automated exposure control, adjustment of the mA and/or kV according to patient size and/or use of iterative reconstruction technique. CONTRAST:  OMNIPAQUE  IOHEXOL  350 MG/ML SOLN COMPARISON:  April 26, 2018 FINDINGS: Cardiovascular: The thoracic aorta is unremarkable. The pulmonary arteries are limited in evaluation secondary to suboptimal opacification with intravenous contrast. This study is also limited secondary to the patient's large body habitus and subsequent beam hardening artifact. No evidence of pulmonary embolism. Normal heart size. No pericardial effusion. Mediastinum/Nodes: Properly positioned endotracheal and nasogastric tubes are seen. No enlarged mediastinal, hilar, or axillary lymph nodes. Thyroid gland, trachea, and esophagus demonstrate no significant findings. Lungs/Pleura: Marked severity areas of scarring, atelectasis and/or infiltrate  are seen within the posteromedial aspect of the bilateral lower lobes. Mild right middle lobe atelectatic changes are also present. No pleural effusion or pneumothorax is identified. Upper Abdomen: No acute abnormality. Musculoskeletal: No chest wall abnormality. No acute or significant osseous findings. Review of the MIP images confirms the above findings. IMPRESSION: 1. Limited study without evidence of pulmonary embolism. 2. Marked severity areas of bilateral lower lobe scarring, atelectasis and/or infiltrate. 3. Mild right middle lobe atelectatic changes. 4. Properly positioned endotracheal and nasogastric tubes. Electronically Signed   By: Virgle Grime M.D.   On: 10/31/2023 03:58   DG Abdomen 1 View Result Date: 10/31/2023 CLINICAL DATA:  Orogastric tube placement. EXAM: ABDOMEN - 1 VIEW COMPARISON:  Dec 13, 2019 FINDINGS: There is stable endotracheal tube positioning. An enteric tube is noted with its distal end overlying the body of the stomach. The distal side hole sits approximately 7.8 cm from the gastroesophageal junction. The bowel gas pattern is normal. No radio-opaque calculi or other significant radiographic abnormality are seen. IMPRESSION: Enteric tube positioning, as described above. Electronically Signed   By: Virgle Grime M.D.   On: 10/31/2023 01:20   DG Chest Portable 1 View Result Date: 10/31/2023 CLINICAL DATA:  Status post intubation EXAM: PORTABLE CHEST 1 VIEW COMPARISON:  October 30, 2023 FINDINGS: Since the prior study there is been interval placement of an endotracheal tube with its distal tip approximately 3.8 cm from the carina. The cardiac silhouette is enlarged and unchanged in size. Low lung volumes are seen with mild left perihilar, left suprahilar and right basilar atelectasis and/or infiltrate. No pleural effusion or pneumothorax is identified. The visualized skeletal structures are unremarkable. IMPRESSION: 1. Interval endotracheal tube placement, as described above.  2. Low lung volumes with mild left perihilar, left suprahilar and right basilar atelectasis and/or infiltrate. Electronically Signed   By: Virgle Grime M.D.   On: 10/31/2023 01:16   DG Chest Portable 1 View Result Date: 10/31/2023 CLINICAL DATA:  Respiratory distress. EXAM: PORTABLE CHEST 1 VIEW COMPARISON:  April 23, 2020 FINDINGS: The cardiac silhouette is enlarged. This may be, in part, technical in origin. Low lung volumes are seen with mild prominence of the central pulmonary vasculature. Prominence of the bronchovascular lung markings is also noted. Mild atelectasis and/or infiltrate is suspected within the right lung base. No pleural effusion or pneumothorax is identified. The visualized skeletal structures are unremarkable. IMPRESSION: Low lung volumes with mild right basilar atelectasis and/or infiltrate. Electronically Signed   By: Virgle Grime M.D.   On: 10/31/2023 01:14    Microbiology: Results for orders placed or performed during the hospital encounter of 10/30/23  Blood culture (routine x  2)     Status: None   Collection Time: 10/30/23 11:50 PM   Specimen: BLOOD  Result Value Ref Range Status   Specimen Description BLOOD BLOOD RIGHT HAND  Final   Special Requests   Final    BOTTLES DRAWN AEROBIC AND ANAEROBIC Blood Culture adequate volume   Culture   Final    NO GROWTH 5 DAYS Performed at [facility], [address] [place], [place] [address]    Report Status 11/05/2023 FINAL  Final  Resp panel by RT-PCR (RSV, Flu A&B, Covid) Anterior Nasal Swab     Status: None   Collection Time: 10/31/23 12:33 AM   Specimen: Anterior Nasal Swab  Result Value Ref Range Status   SARS Coronavirus 2 by RT PCR NEGATIVE NEGATIVE Final    Comment: (NOTE) SARS-CoV-2 target nucleic acids are NOT DETECTED.  The SARS-CoV-2 RNA is generally detectable in upper respiratory specimens during the acute phase of infection. The lowest concentration of SARS-CoV-2 viral copies this  assay can detect is 138 copies/mL. A negative result does not preclude SARS-Cov-2 infection and should not be used as the sole basis for treatment or other patient management decisions. A negative result may occur with  improper specimen collection/handling, submission of specimen other than nasopharyngeal swab, presence of viral mutation(s) within the areas targeted by this assay, and inadequate number of viral copies(<138 copies/mL). A negative result must be combined with clinical observations, patient history, and epidemiological information. The expected result is Negative.  Fact Sheet for Patients:  BloggerCourse.com  Fact Sheet for Healthcare Providers:  SeriousBroker.it  This test is no t yet approved or cleared by the [place]  FDA and  has been authorized for detection and/or diagnosis of SARS-CoV-2 by FDA under an Emergency Use Authorization (EUA). This EUA will remain  in effect (meaning this test can be used) for the duration of the COVID-19 declaration under Section 564(b)(1) of the Act, 21 U.S.C.section 360bbb-3(b)(1), unless the authorization is terminated  or revoked sooner.       Influenza A by PCR NEGATIVE NEGATIVE Final   Influenza B by PCR NEGATIVE NEGATIVE Final    Comment: (NOTE) The Xpert Xpress SARS-CoV-2/FLU/RSV plus assay is intended as an aid in the diagnosis of influenza from Nasopharyngeal swab specimens and should not be used as a sole basis for treatment. Nasal washings and aspirates are unacceptable for Xpert Xpress SARS-CoV-2/FLU/RSV testing.  Fact Sheet for Patients: BloggerCourse.com  Fact Sheet for Healthcare Providers: SeriousBroker.it  This test is not yet approved or cleared by the [place]  FDA and has been authorized for detection and/or diagnosis of SARS-CoV-2 by FDA under an Emergency Use Authorization (EUA). This EUA will  remain in effect (meaning this test can be used) for the duration of the COVID-19 declaration under Section 564(b)(1) of the Act, 21 U.S.C. section 360bbb-3(b)(1), unless the authorization is terminated or revoked.     Resp Syncytial Virus by PCR NEGATIVE NEGATIVE Final    Comment: (NOTE) Fact Sheet for Patients: BloggerCourse.com  Fact Sheet for Healthcare Providers: SeriousBroker.it  This test is not yet approved or cleared by the [place]  FDA and has been authorized for detection and/or diagnosis of SARS-CoV-2 by FDA under an Emergency Use Authorization (EUA). This EUA will remain in effect (meaning this test can be used) for the duration of the COVID-19 declaration under Section 564(b)(1) of the Act, 21 U.S.C. section 360bbb-3(b)(1), unless the authorization is terminated or revoked.  Performed at [facility], [address] [place], [place] [address]   Blood culture (routine x 2)     Status: Abnormal   Collection Time: 10/31/23  1:36 AM   Specimen: BLOOD  Result Value Ref Range Status   Specimen Description   Final    BLOOD RIGHT ANTECUBITAL Performed at [facility], [address] [place], [place] [address]    Special Requests   Final    BOTTLES DRAWN AEROBIC AND ANAEROBIC Blood Culture results may not be optimal due to an inadequate volume of blood received in culture bottles Performed at [facility], [address] [place], [place] [address]    Culture  Setup Time   Final    IN BOTH AEROBIC AND ANAEROBIC BOTTLES GRAM POSITIVE COCCI CALLED TO ANGELA DOBBS 1631 10/31/23 LRL    Culture (A)  Final    STAPHYLOCOCCUS HOMINIS STAPHYLOCOCCUS EPIDERMIDIS THE SIGNIFICANCE OF ISOLATING THIS ORGANISM FROM A SINGLE SET OF BLOOD CULTURES WHEN MULTIPLE SETS ARE DRAWN IS UNCERTAIN. PLEASE NOTIFY THE MICROBIOLOGY DEPARTMENT WITHIN ONE WEEK IF SPECIATION AND SENSITIVITIES ARE REQUIRED. Performed  at [facility], [address] [place], [place] [address]    Report Status 11/05/2023 FINAL  Final  Blood Culture ID Panel (Reflexed)     Status: Abnormal   Collection Time: 10/31/23  1:36 AM  Result Value Ref Range Status   Enterococcus faecalis NOT DETECTED NOT DETECTED Final   Enterococcus Faecium NOT DETECTED NOT DETECTED Final   Listeria monocytogenes NOT DETECTED NOT DETECTED Final   Staphylococcus species DETECTED (A) NOT DETECTED Corrected    Comment: CRITICAL RESULT CALLED TO, READ BACK BY AND VERIFIED WITH: CALLED TO ANGELA DOBBS 1631 10/31/23 LRL CORRECTED ON 04/09 AT 0852: PREVIOUSLY REPORTED AS DETECTED CALLED TO ANGELA DOBBS 1631 10/31/23 LRL    Staphylococcus aureus (BCID) NOT DETECTED NOT DETECTED Final   Staphylococcus epidermidis DETECTED (A) NOT DETECTED Corrected    Comment: CRITICAL RESULT CALLED TO, READ BACK BY AND VERIFIED WITH: CALLED TO ANGELA DOBBS 1631 10/31/23 LRL CORRECTED ON 04/09 AT 0852: PREVIOUSLY REPORTED AS DETECTED CALLED TO ANGELA DOBBS 1631 10/31/23 LRL    Staphylococcus lugdunensis NOT DETECTED NOT DETECTED Final   Streptococcus species NOT DETECTED NOT DETECTED Final   Streptococcus agalactiae NOT DETECTED NOT DETECTED Final   Streptococcus pneumoniae NOT DETECTED NOT DETECTED Final   Streptococcus pyogenes NOT DETECTED NOT DETECTED Final   A.calcoaceticus-baumannii NOT DETECTED NOT DETECTED Final   Bacteroides fragilis NOT DETECTED NOT DETECTED Final   Enterobacterales NOT DETECTED NOT DETECTED Final   Enterobacter cloacae complex NOT DETECTED NOT DETECTED Final   Escherichia coli NOT DETECTED NOT DETECTED Final   Klebsiella aerogenes NOT DETECTED NOT DETECTED Final   Klebsiella oxytoca NOT DETECTED NOT DETECTED Final   Klebsiella pneumoniae NOT DETECTED NOT DETECTED Final   Proteus species NOT DETECTED NOT DETECTED Final   Salmonella species NOT DETECTED NOT DETECTED Final   Serratia marcescens NOT DETECTED NOT DETECTED Final    Haemophilus influenzae NOT DETECTED NOT DETECTED Final   Neisseria meningitidis NOT DETECTED NOT DETECTED Final   Pseudomonas aeruginosa NOT DETECTED NOT DETECTED Final   Stenotrophomonas maltophilia NOT DETECTED NOT DETECTED Final   Candida albicans NOT DETECTED NOT DETECTED Final   Candida auris NOT DETECTED NOT DETECTED Final   Candida glabrata NOT DETECTED NOT DETECTED Final   Candida krusei NOT DETECTED NOT DETECTED Final   Candida parapsilosis NOT DETECTED NOT DETECTED Final   Candida tropicalis NOT DETECTED NOT DETECTED Final  Cryptococcus neoformans/gattii NOT DETECTED NOT DETECTED Final   Methicillin resistance mecA/C NOT DETECTED NOT DETECTED Final    Comment: Performed at [facility], [address] [place], [place] [address]  Respiratory (~20 pathogens) panel by PCR     Status: None   Collection Time: 10/31/23 12:07 PM   Specimen: Nasopharyngeal Swab; Respiratory  Result Value Ref Range Status   Adenovirus NOT DETECTED NOT DETECTED Final   Coronavirus 229E NOT DETECTED NOT DETECTED Final    Comment: (NOTE) The Coronavirus on the Respiratory Panel, DOES NOT test for the novel  Coronavirus (2019 nCoV)    Coronavirus HKU1 NOT DETECTED NOT DETECTED Final   Coronavirus NL63 NOT DETECTED NOT DETECTED Final   Coronavirus OC43 NOT DETECTED NOT DETECTED Final   Metapneumovirus NOT DETECTED NOT DETECTED Final   Rhinovirus / Enterovirus NOT DETECTED NOT DETECTED Final   Influenza A NOT DETECTED NOT DETECTED Final   Influenza B NOT DETECTED NOT DETECTED Final   Parainfluenza Virus 1 NOT DETECTED NOT DETECTED Final   Parainfluenza Virus 2 NOT DETECTED NOT DETECTED Final   Parainfluenza Virus 3 NOT DETECTED NOT DETECTED Final   Parainfluenza Virus 4 NOT DETECTED NOT DETECTED Final   Respiratory Syncytial Virus NOT DETECTED NOT DETECTED Final   Bordetella pertussis NOT DETECTED NOT DETECTED Final   Bordetella Parapertussis NOT DETECTED NOT DETECTED Final   Chlamydophila  pneumoniae NOT DETECTED NOT DETECTED Final   Mycoplasma pneumoniae NOT DETECTED NOT DETECTED Final    Comment: Performed at [facility], [address], [place], [place] [address]  Culture, Respiratory w Gram Stain     Status: None   Collection Time: 10/31/23  4:53 PM   Specimen: Tracheal Aspirate; Respiratory  Result Value Ref Range Status   Specimen Description   Final    TRACHEAL ASPIRATE Performed at [facility], [address] [place], [place] [address]    Special Requests   Final    NONE Performed at [facility], [address] [place], [place] [address]    Gram Stain   Final    NO WBC SEEN RARE GRAM POSITIVE COCCI RARE GRAM POSITIVE RODS    Culture   Final    ABUNDANT Normal respiratory flora-no Staph aureus or Pseudomonas seen Performed at [facility], [address] [place], [place] [address]    Report Status 11/03/2023 FINAL  Final  MRSA Next Gen by PCR, Nasal     Status: None   Collection Time: 11/01/23  6:17 PM   Specimen: Nasal Mucosa; Nasal Swab  Result Value Ref Range Status   MRSA by PCR Next Gen NOT DETECTED NOT DETECTED Final    Comment: (NOTE) The GeneXpert MRSA Assay (FDA approved for NASAL specimens only), is one component of a comprehensive MRSA colonization surveillance program. It is not intended to diagnose MRSA infection nor to guide or monitor treatment for MRSA infections. Test performance is not FDA approved in patients less than 4 years old. Performed at [facility], [address] [place], [place] [address]     Labs: CBC: Recent Labs  Lab 11/09/23 0435 11/10/23 0618 11/14/23 1335 11/14/23 1340 11/14/23 1341  WBC 12.3* 10.1  --   --   --   NEUTROABS 9.3* 7.1  --   --   --   HGB 13.6 13.7 15.0 16.7 14.6  HCT 49.4 49.8 44.0 49.0 43.0  MCV 96.1 94.9  --   --   --  PLT 200 190  --   --   --    Basic Metabolic Panel: Recent Labs  Lab 11/09/23 0435 11/10/23 0618 11/13/23 0445  11/14/23 0500 11/14/23 1335 11/14/23 1340 11/14/23 1341 11/15/23 0431  NA 141 140 142 138 144 140 139 141  K 4.0 3.6 3.3* 3.3* 3.0* 3.6 2.7* 3.9  CL 103 103 97* 96*  --   --   --  102  CO2 31 31 33* 32  --   --   --  29  GLUCOSE 122* 134* 140* 146*  --   --   --  138*  BUN 22* 19 18 22*  --   --   --  21*  CREATININE 0.84 0.71 0.87 1.00  --   --   --  0.90  CALCIUM  9.1 8.9 9.0 9.2  --   --   --  9.2  MG  --   --   --  2.2  --   --   --   --    Liver Function Tests: Recent Labs  Lab 11/13/23 0445  AST 106*  ALT 230*  ALKPHOS 43  BILITOT 0.6  PROT 6.9  ALBUMIN 3.2*   CBG: Recent Labs  Lab 11/14/23 0847 11/14/23 1218 11/14/23 1634 11/14/23 2151 11/15/23 0752  GLUCAP 139* 103* 126* 126* 148*    Discharge time spent: 39 minutes.  Signed: Aisha Hove, MD [facility] 11/15/2023

## 2023-11-15 NOTE — Telephone Encounter (Signed)
 Pharmacy Patient Advocate Encounter   Received notification from Inpatient Request that prior authorization for Ipratropium-Albuterol  0.5-2.5 (3)MG/3ML solution is required/requested.   Insurance verification completed.   The patient is insured through [employer] .   Per test claim: PA required; PA submitted to above mentioned insurance via CoverMyMeds Key/confirmation #/EOC [id number] Status is pending

## 2023-11-15 NOTE — Telephone Encounter (Signed)
 Pharmacy Patient Advocate Encounter   Received notification from Inpatient Request that prior authorization for Budesonide  0.25MG /2ML suspension is required/requested.   Insurance verification completed.   The patient is insured through [facility] .   Per test claim: PA required; PA submitted to above mentioned insurance via CoverMyMeds Key/confirmation #/EOC [id number] Status is pending

## 2023-11-15 NOTE — Telephone Encounter (Signed)
 Pharmacy Patient Advocate Encounter  Received notification from [employer] that Prior Authorization for Budesonide  0.25MG /2ML suspension  has been DENIED.  Full denial letter will be uploaded to the media tab. See denial reason below.  BUDESONIDE  Suspension is used in a nebulizer. A nebulizer is a piece of durable medical equipment (DME). Drugs used with DME in the home are covered under Medicare Part B. Our records show that you do not live in a long term care (LTC) facility. We cannot pay for drugs under Medicare Part D if they are covered under Medicare Part A or B. We did not decide whether BUDESONIDE  Suspension is medically necessary. We made our decision only on the fact that we cannot pay for the drug under Medicare Part D PA #/Case ID/Reference #: [id number]

## 2023-11-15 NOTE — TOC Transition Note (Signed)
 Transition of Care [facility]) - Discharge Note   Patient Details  Name: Clifford Nichols MRN: [id number] Date of Birth: August 12, 1980  Transition of [facility]) CM/SW Contact:  Zoe Hinds, RN Phone Number: 11/15/2023, 11:57 AM   Clinical Narrative:    Thus CM confirmed with medical team pt is medical cleared to dc today & dc order placed. This confirmed with pt he has dc transportation arranged with his wife . This CM also confirmed with pt he has been in contact with [name] at [name] regarding the delivery of his NIV. No additional dc needs requested at this time.     Final next level of care: Home w Home Health Services Barriers to Discharge: No Barriers Identified   Patient Goals and CMS Choice   CMS Medicare.gov Compare Post Acute Care list provided to:: Patient Choice offered to / list presented to : Patient      Discharge Placement                    Patient and family notified of of transfer: 11/15/23  Discharge Plan and Services Additional resources added to the After Visit Summary for     Discharge Planning Services: CM Consult Post Acute Care Choice: Home Health          DME Arranged: Bipap, Bedside commode (Both items are Bariatric per [name] at [name] D[employer] company) DME Agency: [employer] Date DME Agency Contacted: (S) 11/15/23 Time DME Agency Contacted: [id number] Representative spoke with at DME Agency: [name] [phone] HH Arranged: RN, Disease Management, PT, OT, Social Work [employer] Agency: [name] Date [facility] Agency Contacted: 11/07/23   Representative spoke with at [facility] Agency: [name]  Social Drivers of Health (SDOH) Interventions SDOH Screenings   Food Insecurity: Food Insecurity Present (11/08/2023)  Housing: Low Risk  (11/08/2023)  Transportation Needs: Unmet Transportation Needs (11/08/2023)  Utilities: Not At Risk (10/31/2023)  Financial Resource Strain: High Risk (11/08/2023)  Tobacco Use: Low Risk  (11/08/2023)     Readmission Risk  Interventions     No data to display

## 2023-11-15 NOTE — Plan of Care (Signed)
   Problem: Education: Goal: Knowledge of General Education information will improve Description Including pain rating scale, medication(s)/side effects and non-pharmacologic comfort measures Outcome: Progressing

## 2023-11-15 NOTE — Telephone Encounter (Signed)
 Pharmacy Patient Advocate Encounter  Received notification from [facility] that Prior Authorization for Ipratropium-Albuterol  0.5-2.5 (3)MG/3ML solution  has been DENIED.  Full denial letter will be uploaded to the media tab. See denial reason below. IPRATROPIUM-ALBUTEROL  Solution is used in a nebulizer. A nebulizer is a piece of durable medical equipment (DME). Drugs used with DME in the home are covered under Medicare Part B. Our records show that you do not live in a long term care (LTC) facility. We cannot pay for drugs under Medicare Part D if they are covered under Medicare Part A or B. We did not decide whether IPRATROPIUM-ALBUTEROL  Solution is medically necessary. We made our decision only on the fact that we cannot pay for the drug under Medicare Part D.   PA #/Case ID/Reference #: [id number]

## 2023-11-16 ENCOUNTER — Encounter: Payer: Self-pay | Admitting: Pulmonary Disease

## 2023-11-16 LAB — LIPOPROTEIN A (LPA): Lipoprotein (a): 66.6 nmol/L — ABNORMAL HIGH (ref <75.0)

## 2023-11-17 ENCOUNTER — Encounter: Admitting: Family

## 2023-11-17 ENCOUNTER — Telehealth: Payer: Self-pay | Admitting: Cardiology

## 2023-11-17 NOTE — Telephone Encounter (Signed)
 Called to confirm/remind patient of their appointment at the [facility] on 11/20/23.   Appointment:   [x] Confirmed  [] Left mess   [] No answer/No voice mail  [] VM Full/unable to leave message  [] Phone not in service  Patient reminded to bring all medications and/or complete list.  Confirmed patient has transportation. Gave directions, instructed to utilize valet parking.

## 2023-11-20 ENCOUNTER — Other Ambulatory Visit (HOSPITAL_COMMUNITY): Payer: Self-pay | Admitting: Cardiology

## 2023-11-20 ENCOUNTER — Other Ambulatory Visit
Admission: RE | Admit: 2023-11-20 | Discharge: 2023-11-20 | Disposition: A | Discharge status: Home / Self Care | Source: Ambulatory Visit | Attending: Cardiology | Admitting: Cardiology

## 2023-11-20 ENCOUNTER — Encounter: Payer: Self-pay | Admitting: Cardiology

## 2023-11-20 ENCOUNTER — Ambulatory Visit (HOSPITAL_BASED_OUTPATIENT_CLINIC_OR_DEPARTMENT_OTHER): Admitting: Cardiology

## 2023-11-20 ENCOUNTER — Inpatient Hospital Stay (HOSPITAL_COMMUNITY)
Admission: RE | Admit: 2023-11-20 | Discharge: 2023-11-20 | Disposition: A | Discharge status: Home / Self Care | Source: Ambulatory Visit | Attending: Cardiology | Admitting: Cardiology

## 2023-11-20 VITALS — BP 109/78 | HR 108 | Wt 392.4 lb

## 2023-11-20 DIAGNOSIS — I5022 Chronic systolic (congestive) heart failure: Secondary | ICD-10-CM | POA: Diagnosis not present

## 2023-11-20 DIAGNOSIS — Z9981 Dependence on supplemental oxygen: Secondary | ICD-10-CM | POA: Diagnosis not present

## 2023-11-20 DIAGNOSIS — Z5986 Financial insecurity: Secondary | ICD-10-CM | POA: Insufficient documentation

## 2023-11-20 DIAGNOSIS — Z79899 Other long term (current) drug therapy: Secondary | ICD-10-CM | POA: Diagnosis not present

## 2023-11-20 DIAGNOSIS — Z6841 Body Mass Index (BMI) 40.0 and over, adult: Secondary | ICD-10-CM | POA: Diagnosis not present

## 2023-11-20 DIAGNOSIS — E669 Obesity, unspecified: Secondary | ICD-10-CM | POA: Insufficient documentation

## 2023-11-20 DIAGNOSIS — Z7985 Long-term (current) use of injectable non-insulin antidiabetic drugs: Secondary | ICD-10-CM | POA: Diagnosis not present

## 2023-11-20 DIAGNOSIS — R000 Tachycardia, unspecified: Secondary | ICD-10-CM

## 2023-11-20 DIAGNOSIS — Z5982 Transportation insecurity: Secondary | ICD-10-CM | POA: Diagnosis not present

## 2023-11-20 DIAGNOSIS — I420 Dilated cardiomyopathy: Secondary | ICD-10-CM | POA: Diagnosis present

## 2023-11-20 DIAGNOSIS — G4733 Obstructive sleep apnea (adult) (pediatric): Secondary | ICD-10-CM | POA: Insufficient documentation

## 2023-11-20 DIAGNOSIS — Z7984 Long term (current) use of oral hypoglycemic drugs: Secondary | ICD-10-CM | POA: Insufficient documentation

## 2023-11-20 DIAGNOSIS — R0602 Shortness of breath: Secondary | ICD-10-CM | POA: Diagnosis present

## 2023-11-20 DIAGNOSIS — Z8616 Personal history of COVID-19: Secondary | ICD-10-CM | POA: Insufficient documentation

## 2023-11-20 DIAGNOSIS — E119 Type 2 diabetes mellitus without complications: Secondary | ICD-10-CM | POA: Insufficient documentation

## 2023-11-20 DIAGNOSIS — I493 Ventricular premature depolarization: Secondary | ICD-10-CM | POA: Insufficient documentation

## 2023-11-20 DIAGNOSIS — I4510 Unspecified right bundle-branch block: Secondary | ICD-10-CM | POA: Diagnosis not present

## 2023-11-20 DIAGNOSIS — I110 Hypertensive heart disease with heart failure: Secondary | ICD-10-CM | POA: Insufficient documentation

## 2023-11-20 LAB — BASIC METABOLIC PANEL WITH GFR
Anion gap: 12 (ref 5–15)
BUN: 12 mg/dL (ref 6–20)
CO2: 22 mmol/L (ref 22–32)
Calcium: 9.5 mg/dL (ref 8.9–10.3)
Chloride: 105 mmol/L (ref 98–111)
Creatinine, Ser: 1.29 mg/dL — ABNORMAL HIGH (ref 0.61–1.24)
GFR, Estimated: >60 mL/min (ref >60)
Glucose, Bld: 123 mg/dL — ABNORMAL HIGH (ref 70–99)
Potassium: 4.9 mmol/L (ref 3.5–5.1)
Sodium: 139 mmol/L (ref 135–145)

## 2023-11-20 LAB — BRAIN NATRIURETIC PEPTIDE: B Natriuretic Peptide: 11.1 pg/mL (ref 0.0–100.0)

## 2023-11-20 MED ORDER — METOPROLOL SUCCINATE ER 50 MG PO TB24: 50.0000 mg | ORAL_TABLET | Freq: Every day | ORAL | 3 refills | Status: AC

## 2023-11-20 NOTE — Patient Instructions (Signed)
 Medication Changes:  INCREASE METOPROLOL  XL TO 50 MG ONCE DAILY   Lab Work:  Go over to the [facility]ICAL MALL. Go pass the gift shop and have your blood work completed.  We will only call you if the results are abnormal or if the provider would like to make medication changes.    Testing/Procedures:  Your provider has recommended that  you wear a Zio Patch for 14 days.  This monitor will record your heart rhythm for our review.  IF you have any symptoms while wearing the monitor please press the button.  If you have any issues with the patch or you notice a red or orange light on it please call the company at [phone].  Once you remove the patch please mail it back to the company as soon as possible so we can get the results.    Follow-Up in: 1 MONTH F/U WITH TINA HACKNEY, FNP.  At the [facility], you and your health needs are our priority. We have a designated team specialized in the treatment of Heart Failure. This Care Team includes your primary Heart Failure Specialized Cardiologist (physician), Advanced Practice Providers (APPs- Physician Assistants and Nurse Practitioners), and Pharmacist who all work together to provide you with the care you need, when you need it.   You may see any of the following providers on your designated Care Team at your next follow up:  Dr. Jules Oar Dr. Peder Bourdon Dr. Alwin Baars Dr. Judyth Nunnery Shawnee Dellen, FNP Bevely Brush, RPH-CPP  Please be sure to bring in all your medications bottles to every appointment.   Need to Contact Us :  If you have any questions or concerns before your next appointment please send us  a message through [place] or call our office at [phone].    TO LEAVE A MESSAGE FOR THE NURSE SELECT OPTION 2, PLEASE LEAVE A MESSAGE INCLUDING: YOUR NAME DATE OF BIRTH CALL BACK NUMBER REASON FOR CALL**this is important as we prioritize the call backs  YOU WILL RECEIVE A CALL BACK THE SAME DAY  AS LONG AS YOU CALL BEFORE 4:00 PM

## 2023-11-20 NOTE — Progress Notes (Signed)
 PCP: Dionicia Frater, MD  Chief complaint: CHF  43 y.o. with history of [name]/OSA, PVCs, nonischemic cardiomyopathy, type 2 diabetes, and prior COVID PNA with ARDS returns for post-hospital followup.  In 11/2019, he had prolonged hospitalization due to COVID PNA with development of septic shock and ARDS requiring intubation, further complicated by Acinetobacter HAP. Echo during admit showed LVEF 55-60%, mild LVH, RV moderately enlarged but systolic fx not well visualized. Required trach but able to decannulate. Discharged to CIR.      As noted above, he has severe OSA. Sleep study 7/21 demonstrated an AHI of 83. He was on CPAP but machine broke and went several years w/o treatment. He recently had repeat study done at [facility] which showed AHI of 211.3. During study, he was also noted to have frequent PVCs. He is now back on CPAP.    Pt was admitted in 4/25 with acute systolic heart failure. He presented with acute respiratory failure/ respiratory distress.  O2 sats in the 80s on 4L/[place]. Placed on NRB then ultimately CPAP. He became increasing lethargic and was intubated for airway protection.  Suspect component of septic shock with Staph hominis bacteremia/PNA. Of note, prior to worsening respiratory failure, pt had also endorsed several month h/o worsening LEE and abdominal swelling/distention. Echo in 4/25 showed newly decreased LV EF to 35-40%, RV moderately dilated and moderately decreased systolic function.  Patient was diuresed and right/left heart catheterization done, no significant CAD. Patient was ultimately successfully extubated.    Patient returns for post-hospital followup.  He seems to be doing well, dyspnea only with long walks and stairs.  He is not taking a diuretic.  He is on home oxygen + CPAP at night.  No lightheadedness or palpitations.  No chest pain.  He just started tirzepatide  today.   ECG (personally reviewed): sinus tachy rate 110, RBBB  Labs (4/25): K 3.9, creatinine  0.9, Lp(a) 67, LDL 140  PMH: 1. [name]/OSA: Using 4 L home oxygen + CPAP.  2. PVCs 3. Type 2 diabetes 4. HTN 5. Obesity 6. Chronic systolic CHF: Nonischemic cardiomyopathy, possibly related to PVCs.  - Echo (4/25) with EF 35-40%, RV moderately dilated and moderately decreased systolic function.  - LHC/RHC (4/25): No significant CAD; mean RA 4, PA 31/20, mean PCWP 3, CI 2.4.  7. COVID PNA with ARDS in 5/21.  Patient had tracheostomy, later decannulated.   Family History  Problem Relation Age of Onset   Coronary artery disease Maternal Uncle        premature, required CABG in 51s   CAD Maternal Uncle        permature, required CABG in 43s   Hypertension Father    Social History   Socioeconomic History   Marital status: Married    Spouse name: Thomas Fleischer   Number of children: 3   Years of education: Not on file   Highest education level: 12th grade  Occupational History   Occupation: Disability  Tobacco Use   Smoking status: Never    Passive exposure: Never   Smokeless tobacco: Never  Vaping Use   Vaping status: Never Used  Substance and Sexual Activity   Alcohol  use: Never   Drug use: Never   Sexual activity: Yes  Other Topics Concern   Not on file  Social History Narrative   ** Merged History Encounter **       Lives locally with mother.  Frequently eats processed and fast foods.   Social Drivers of Health  Financial Resource Strain: High Risk (11/08/2023)   Overall Financial Resource Strain (CARDIA)    Difficulty of Paying Living Expenses: Hard  Food Insecurity: Food Insecurity Present (11/08/2023)   Hunger Vital Sign    Worried About Running Out of Food in the Last Year: Sometimes true    Ran Out of Food in the Last Year: Sometimes true  Transportation Needs: Unmet Transportation Needs (11/08/2023)   PRAPARE - Administrator, Civil Service (Medical): Yes    Lack of Transportation (Non-Medical): Yes  Physical Activity: Not on file  Stress: Not on file   Social Connections: Not on file  Intimate Partner Violence: Not At Risk (10/31/2023)   Humiliation, Afraid, Rape, and Kick questionnaire    Fear of Current or Ex-Partner: No    Emotionally Abused: No    Physically Abused: No    Sexually Abused: No   ROS: All systems reviewed and negative except as per HPI.   Current Outpatient Medications  Medication Sig Dispense Refill   acetaminophen  (TYLENOL ) 325 MG tablet Take 1-2 tablets (325-650 mg total) by mouth every 4 (four) hours as needed for mild pain.     aspirin  EC 81 MG tablet Take 1 tablet (81 mg total) by mouth daily. Swallow whole. 30 tablet 12   atorvastatin  (LIPITOR) 20 MG tablet Take 20 mg by mouth daily.     atorvastatin  (LIPITOR) 40 MG tablet Take 1 tablet (40 mg total) by mouth daily. 90 tablet 0   budesonide  (PULMICORT ) 0.25 MG/2ML nebulizer solution Take 2 mLs (0.25 mg total) by nebulization 2 (two) times daily. 60 mL 12   CYMBALTA  30 MG capsule TAKE 1 CAPSULE DAILY FOR 1 WEEK, THEN INCREASE TO 1 CAPSULE TWICE A DAY FOR MOOD.     dapagliflozin  propanediol (FARXIGA ) 10 MG TABS tablet Take 1 tablet (10 mg total) by mouth daily. 90 tablet 0   DULoxetine  (CYMBALTA ) 60 MG capsule Take 1 capsule (60 mg total) by mouth daily. 60 capsule 3   escitalopram (LEXAPRO) 5 MG tablet Take 5 mg by mouth daily.     gabapentin  (NEURONTIN ) 300 MG capsule Take 2 capsules (600 mg total) by mouth 3 (three) times daily. 90 capsule 1   gabapentin  (NEURONTIN ) 800 MG tablet Take 800 mg by mouth at bedtime.      ipratropium-albuterol  (DUONEB) 0.5-2.5 (3) MG/3ML SOLN Take 3 mLs by nebulization every 6 (six) hours as needed. 360 mL 0   metFORMIN  (GLUCOPHAGE ) 1000 MG tablet Take 1,000 mg by mouth 2 (two) times daily.     metFORMIN  (GLUCOPHAGE ) 1000 MG tablet Take 1,000 mg by mouth 2 (two) times daily.     metoprolol  succinate (TOPROL -XL) 50 MG 24 hr tablet Take 1 tablet (50 mg total) by mouth daily. Take with or immediately following a meal. 90 tablet 3    potassium chloride  SA (KLOR-CON  M) 20 MEQ tablet Take 1 tablet (20 mEq total) by mouth 2 (two) times daily for 5 days. 10 tablet 0   sacubitril -valsartan  (ENTRESTO ) 24-26 MG Take 1 tablet by mouth 2 (two) times daily. 180 tablet 0   spironolactone  (ALDACTONE ) 25 MG tablet Take 1 tablet (25 mg total) by mouth daily. 90 tablet 0   tirzepatide  (MOUNJARO ) 12.5 MG/0.5ML Pen Inject 12.5 mg into the skin once a week. 2 mL 0   No current facility-administered medications for this visit.   BP 109/78   Pulse (!) 108   Wt (!) 392 lb 6.4 oz (178 kg)   SpO2 93%  Comment: 4L of O2  BMI 57.95 kg/m  General: NAD, obese.  Neck: Thick. No JVD, no thyromegaly or thyroid nodule.  Lungs: Clear to auscultation bilaterally with normal respiratory effort. CV: Nondisplaced PMI.  Heart regular S1/S2, no S3/S4, no murmur.  No peripheral edema.  No carotid bruit.  Normal pedal pulses.  Abdomen: Soft, nontender, no hepatosplenomegaly, no distention.  Skin: Intact without lesions or rashes.  Neurologic: Alert and oriented x 3.  Psych: Normal affect. Extremities: No clubbing or cyanosis.  HEENT: Normal.   Assessment/Plan: 1. [name]/OSA: Patient is on home oxygen + CPAP.  Needs significant weight loss.  - He has started tirzepatide .  2.  Chronic systolic CHF: Nonischemic cardiomyopathy.  Echo in 4/25 showed new drop in EF to 35-40%, RV moderately dilated and moderately decreased systolic function. LHC in 4/25 with no significant coronary disease.  Patient has been noted to have frequent PVCs, ?PVC-related cardiomyopathy. NYHA class II, he is not volume overloaded on exam today.  - Continue Entresto  24/26 bid.  - Continue spironolactone  25 mg daily. BMET/BNP today  - Increase Toprol  XL to 50 mg daily.  - Continue dapagliflozin  10 mg daily.  - I will repeat an echo in a few months to reassess LV EF.  - Zio monitor x 2 wks to assess PVC percentage.  3. PVCs: Frequent.  - Increase Toprol  XL as above.  - Zio monitor as  above.   Followup in 1 month with APP.   I spent 32 minutes reviewing records, interviewing/examining patient, and managing orders.   Clifford Nichols 11/20/2023

## 2023-11-21 ENCOUNTER — Encounter: Admitting: Family

## 2023-11-21 ENCOUNTER — Encounter: Admitting: Cardiology

## 2023-11-28 ENCOUNTER — Ambulatory Visit: Admitting: Pulmonary Disease

## 2023-11-28 ENCOUNTER — Encounter: Payer: Self-pay | Admitting: Pulmonary Disease

## 2023-11-28 VITALS — BP 128/80 | HR 117 | Temp 97.8°F | Ht 68.0 in | Wt 390.2 lb

## 2023-11-28 DIAGNOSIS — J9612 Chronic respiratory failure with hypercapnia: Secondary | ICD-10-CM

## 2023-11-28 DIAGNOSIS — E662 Morbid (severe) obesity with alveolar hypoventilation: Secondary | ICD-10-CM

## 2023-11-28 DIAGNOSIS — J4530 Mild persistent asthma, uncomplicated: Secondary | ICD-10-CM

## 2023-11-28 DIAGNOSIS — I420 Dilated cardiomyopathy: Secondary | ICD-10-CM

## 2023-11-28 DIAGNOSIS — J9611 Chronic respiratory failure with hypoxia: Secondary | ICD-10-CM

## 2023-11-28 DIAGNOSIS — R0602 Shortness of breath: Secondary | ICD-10-CM

## 2023-11-28 DIAGNOSIS — Z23 Encounter for immunization: Secondary | ICD-10-CM | POA: Diagnosis not present

## 2023-11-28 DIAGNOSIS — G4730 Sleep apnea, unspecified: Secondary | ICD-10-CM

## 2023-11-28 LAB — PULMONARY FUNCTION TEST
DL/VA % pred: 99 %
DL/VA: 4.7 ml/min/mmHg/L
DLCO cor % pred: 77 %
DLCO cor: 22.73 ml/min/mmHg
DLCO unc % pred: 75 %
DLCO unc: 22.07 ml/min/mmHg
FEF 25-75 Post: 3.27 L/s
FEF 25-75 Pre: 2.39 L/s
FEF2575-%Change-Post: 36 %
FEF2575-%Pred-Post: 86 %
FEF2575-%Pred-Pre: 62 %
FEV1-%Change-Post: 10 %
FEV1-%Pred-Post: 72 %
FEV1-%Pred-Pre: 65 %
FEV1-Post: 2.87 L
FEV1-Pre: 2.59 L
FEV1FVC-%Change-Post: 9 %
FEV1FVC-%Pred-Pre: 99 %
FEV6-%Change-Post: 1 %
FEV6-%Pred-Post: 68 %
FEV6-%Pred-Pre: 67 %
FEV6-Post: 3.31 L
FEV6-Pre: 3.27 L
FEV6FVC-%Pred-Post: 102 %
FEV6FVC-%Pred-Pre: 102 %
FVC-%Change-Post: 1 %
FVC-%Pred-Post: 66 %
FVC-%Pred-Pre: 65 %
FVC-Post: 3.31 L
FVC-Pre: 3.27 L
Post FEV1/FVC ratio: 87 %
Post FEV6/FVC ratio: 100 %
Pre FEV1/FVC ratio: 79 %
Pre FEV6/FVC Ratio: 100 %
RV % pred: 275 %
RV: 4.74 L
TLC % pred: 126 %
TLC: 8.28 L

## 2023-11-28 MED ORDER — ALBUTEROL SULFATE HFA 108 (90 BASE) MCG/ACT IN AERS: 2.0000 | INHALATION_SPRAY | Freq: Four times a day (QID) | RESPIRATORY_TRACT | 2 refills | Status: AC | PRN

## 2023-11-28 MED ORDER — ADVAIR HFA 115-21 MCG/ACT IN AERO: 2.0000 | INHALATION_SPRAY | Freq: Two times a day (BID) | RESPIRATORY_TRACT | 11 refills | Status: AC

## 2023-11-28 MED ORDER — BUDESONIDE-FORMOTEROL FUMARATE 160-4.5 MCG/ACT IN AERO: 2.0000 | INHALATION_SPRAY | Freq: Two times a day (BID) | RESPIRATORY_TRACT | 11 refills | Status: DC

## 2023-11-28 NOTE — Progress Notes (Signed)
 Full PFT performed today.

## 2023-11-28 NOTE — Progress Notes (Unsigned)
 Subjective:    Patient ID: Clifford Nichols, male    DOB: Mar 05, 1981, 43 y.o.   MRN: [id number]  Patient Care Team: Dionicia Frater, MD as PCP - General (Family Medicine) Devorah Fonder, MD as PCP - Cardiology (Cardiology) Marc Senior, MD as Consulting Physician (Pulmonary Disease) Dionicia Frater, MD (Family Medicine)  Chief Complaint  Patient presents with  . Follow-up    No cough or wheezing. Shortness of breath on exertion. Wearing BiPAP every night. No problems with mask or pressure. Oxygen 2L at rest and night. 4L with activity.     BACKGROUND/INTERVAL:  HPI    Review of Systems A 10 point review of systems was performed and it is as noted above otherwise negative.   Patient Active Problem List   Diagnosis Date Noted  . Acute respiratory failure with hypoxia and hypercapnia (HCC) 11/12/2023  . Acute sepsis (HCC) 11/12/2023  . Septic shock (HCC) 11/12/2023  . Dilated cardiomyopathy (HCC) 11/12/2023  . Obesity hypoventilation syndrome (HCC) 11/12/2023  . Sleep apnea 11/12/2023  . Acute on chronic combined systolic and diastolic CHF (congestive heart failure) (HCC) 11/12/2023  . Acute on chronic respiratory failure with hypoxia and hypercapnia (HCC) 10/31/2023  . History of COVID-19 05/14/2020  . Foot pain (Bilateral) 04/07/2020  . Chronic pain syndrome 04/05/2020  . Pharmacologic therapy 04/05/2020  . Disorder of skeletal system 04/05/2020  . Problems influencing health status 04/05/2020  . Long term prescription opiate use 04/05/2020  . Uncomplicated opioid dependence (HCC) 04/05/2020  . Opioid use (90 MME) 04/05/2020  . 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 Neurogenic pain 04/05/2020  . Chronic neuropathic pain 04/05/2020  . Morbid obesity with BMI of 50.0-59.9, adult (HCC) 01/27/2020  . OSA (obstructive sleep apnea) 01/27/2020  . Critical illness polyneuropathy (HCC) 01/13/2020  . Debility 01/13/2020  . Status post  tracheostomy (HCC)   . Pressure injury of skin 12/27/2019  . Acute on chronic respiratory failure with hypoxia and hypercapnia (HCC)   . Acute respiratory failure (HCC)   . ARDS (adult respiratory distress syndrome) (HCC) 12/04/2019  . COVID-19 12/03/2019  . Acute respiratory failure due to COVID-19 (HCC) 12/02/2019  . Hyperglycemia 12/02/2019  . Diabetes (HCC) 12/02/2019  . HTN (hypertension) 04/27/2018    Social History   Tobacco Use  . Smoking status: Never    Passive exposure: Never  . Smokeless tobacco: Never  Substance Use Topics  . Alcohol  use: Never    No Known Allergies  Current Meds  Medication Sig  . acetaminophen  (TYLENOL ) 325 MG tablet Take 1-2 tablets (325-650 mg total) by mouth every 4 (four) hours as needed for mild pain.  . albuterol  (VENTOLIN  HFA) 108 (90 Base) MCG/ACT inhaler Inhale 2 puffs into the lungs every 6 (six) hours as needed for wheezing or shortness of breath.  . aspirin  EC 81 MG tablet Take 1 tablet (81 mg total) by mouth daily. Swallow whole.  . atorvastatin  (LIPITOR) 20 MG tablet Take 20 mg by mouth daily.  . atorvastatin  (LIPITOR) 40 MG tablet Take 1 tablet (40 mg total) by mouth daily.  . budesonide -formoterol (SYMBICORT) 160-4.5 MCG/ACT inhaler Inhale 2 puffs into the lungs 2 (two) times daily.  . CYMBALTA  30 MG capsule TAKE 1 CAPSULE DAILY FOR 1 WEEK, THEN INCREASE TO 1 CAPSULE TWICE A DAY FOR MOOD.  . dapagliflozin  propanediol (FARXIGA ) 10 MG TABS tablet Take 1 tablet (10 mg total) by mouth daily.  . DULoxetine  (CYMBALTA ) 60 MG capsule Take  1 capsule (60 mg total) by mouth daily.  Aaron Aas escitalopram (LEXAPRO) 5 MG tablet Take 5 mg by mouth daily.  . gabapentin  (NEURONTIN ) 300 MG capsule Take 2 capsules (600 mg total) by mouth 3 (three) times daily.  . gabapentin  (NEURONTIN ) 800 MG tablet Take 800 mg by mouth at bedtime.   Aaron Aas ipratropium-albuterol  (DUONEB) 0.5-2.5 (3) MG/3ML SOLN Take 3 mLs by nebulization every 6 (six) hours as needed.  .  metFORMIN  (GLUCOPHAGE ) 1000 MG tablet Take 1,000 mg by mouth 2 (two) times daily.  . metFORMIN  (GLUCOPHAGE ) 1000 MG tablet Take 1,000 mg by mouth 2 (two) times daily.  . metoprolol  succinate (TOPROL -XL) 50 MG 24 hr tablet Take 1 tablet (50 mg total) by mouth daily. Take with or immediately following a meal.  . sacubitril -valsartan  (ENTRESTO ) 24-26 MG Take 1 tablet by mouth 2 (two) times daily.  . spironolactone  (ALDACTONE ) 25 MG tablet Take 1 tablet (25 mg total) by mouth daily.  . tirzepatide  (MOUNJARO ) 12.5 MG/0.5ML Pen Inject 12.5 mg into the skin once a week.  . [DISCONTINUED] budesonide  (PULMICORT ) 0.25 MG/2ML nebulizer solution Take 2 mLs (0.25 mg total) by nebulization 2 (two) times daily.    Immunization History  Administered Date(s) Administered  . Influenza-Unspecified 07/26/2023  . PNEUMOCOCCAL CONJUGATE-20 11/28/2023        Objective:     BP 128/80 (BP Location: Left Wrist, Patient Position: Sitting, Cuff Size: Normal)   Pulse (!) 117   Temp 97.8 F (36.6 C) (Temporal)   Ht 5\' 8"  (1.727 m)   Wt (!) 390 lb 3.2 oz (177 kg)   SpO2 92%   BMI 59.33 kg/m   SpO2: 92 %  GENERAL: Morbidly obese gentleman, presents in transport chair though able to ambulate better than prior.  No respiratory distress.  No conversational dyspnea.  Awake, alert, conversant. HEAD: Normocephalic, atraumatic.  EYES: Pupils equal, round, reactive to light.  No scleral icterus.  MOUTH: Dentition intact, oral mucosa moist.  No thrush. NECK: Supple. No thyromegaly. Trachea midline.  Cannot assess JVD.  Very thick neck.  No adenopathy. PULMONARY: Good air entry bilaterally.  No adventitious sounds. CARDIOVASCULAR: S1 and S2.  Tachycardic, regular rate, no rubs murmurs gallops heard.  ABDOMEN: Extremely obese, soft.  No other assessment possible. MUSCULOSKELETAL: No joint deformity, no clubbing, no edema.  NEUROLOGIC: No overt neurodeficit.  No asterixis. SKIN: Intact,warm,dry. PSYCH: Mood and  behavior normal.  Ambulatory oxymetry was performed today:  At rest on room air oxygen saturation was 92%, the patient ambulated at a slow, assisted pace (walker), completed 2 laps, O2 nadir 92%, mild shortness of breath.  Oxygen actually increased to 94% during ambulation.  Resting heart rate was 118 bpm at maximum for this exercise 144 bpm.  Study suggestive of mostly cardiac limitation/deconditioning.       Assessment & Plan:     ICD-10-CM   1. Mild persistent asthma without complication  J45.30     2. Chronic respiratory failure with hypoxia and hypercapnia (HCC)  J96.11    J96.12     3. Extreme obesity with alveolar hypoventilation (HCC)  E66.2     4. Dilated cardiomyopathy (HCC)  I42.0     5. Severe sleep apnea  G47.30     6. Need for pneumococcal 20-valent conjugate vaccination  Z23 Pneumococcal conjugate vaccine 20-valent      Orders Placed This Encounter  Procedures  . Pneumococcal conjugate vaccine 20-valent    Meds ordered this encounter  Medications  . budesonide -formoterol (SYMBICORT)  160-4.5 MCG/ACT inhaler    Sig: Inhale 2 puffs into the lungs 2 (two) times daily.    Dispense:  10.2 g    Refill:  11  . albuterol  (VENTOLIN  HFA) 108 (90 Base) MCG/ACT inhaler    Sig: Inhale 2 puffs into the lungs every 6 (six) hours as needed for wheezing or shortness of breath.    Dispense:  8 g    Refill:  2       Advised if symptoms do not improve or worsen, to please contact office for sooner follow up or seek emergency care.    I spent xxx minutes of dedicated to the care of this patient on the date of this encounter to include pre-visit review of records, face-to-face time with the patient discussing conditions above, post visit ordering of testing, clinical documentation with the electronic health record, making appropriate referrals as documented, and communicating necessary findings to members of the patients care team.     C. Chloe Counter, MD Advanced  Bronchoscopy PCCM [facility]    *This note was generated using voice recognition software/Dragon and/or AI transcription program.  Despite best efforts to proofread, errors can occur which can change the meaning. Any transcriptional errors that result from this process are unintentional and may not be fully corrected at the time of dictation.

## 2023-11-28 NOTE — Patient Instructions (Signed)
 VISIT SUMMARY:  Clifford Nichols, a 43 year old male with obesity hypoventilation syndrome, obstructive sleep apnea, and chronic respiratory failure, came in for a follow-up visit. He reports feeling better and is using a BiPAP machine with supplemental oxygen, which he finds helpful. He is compliant with his treatment regimen, including the use of Symbicort and albuterol  inhalers. He has a history of respiratory failure, sepsis, septic shock, and pneumonia, and is currently on Mounjaro  for weight management.  YOUR PLAN:  -OBESITY HYPOVENTILATION SYNDROME: Obesity hypoventilation syndrome is a condition where excess weight affects your breathing, leading to low oxygen and high carbon dioxide levels in your blood. You should continue using your CPAP machine with 2 liters of supplemental oxygen. We will also perform a walk test to assess your oxygen needs.  -OBSTRUCTIVE SLEEP APNEA: Obstructive sleep apnea is a sleep disorder where your breathing repeatedly stops and starts. You should continue using your BiPAP machine and taking Mounjaro  for weight management, as weight loss can help improve your sleep apnea.  -ASTHMA: Asthma is a condition where your airways narrow and swell, making it difficult to breathe. You should use Symbicort, 2 puffs twice a day, and albuterol  as needed for shortness of breath. We will check your insurance coverage for Symbicort.  -HISTORY OF PNEUMONIA: Pneumonia is an infection that inflames the air sacs in one or both lungs. We will check your hospital records to see if you received the pneumococcal vaccine. If not, we will administer it to you.  -HISTORY OF SEPSIS AND SEPTIC SHOCK: Sepsis and septic shock are severe infections that spread throughout the body, leading to organ failure. There are no current signs of infection, so no additional treatment is needed at this time.  INSTRUCTIONS:  Continue using your BiPAP machine with 2 liters of supplemental oxygen. Use  Symbicort, 2 puffs twice a day, and albuterol  as needed for shortness of breath. We will perform a walk test to assess your oxygen needs and check your hospital records for the pneumococcal vaccine. If you have not received it, we will administer it to you.

## 2023-11-28 NOTE — Patient Instructions (Signed)
 Full PFT performed today.

## 2023-11-29 ENCOUNTER — Encounter: Payer: Self-pay | Admitting: Pulmonary Disease

## 2023-12-06 ENCOUNTER — Ambulatory Visit
Admission: RE | Admit: 2023-12-06 | Discharge: 2023-12-06 | Disposition: A | Discharge status: Home / Self Care | Source: Ambulatory Visit | Attending: Pulmonary Disease | Admitting: Pulmonary Disease

## 2023-12-06 ENCOUNTER — Ambulatory Visit: Payer: Self-pay | Admitting: Pulmonary Disease

## 2023-12-06 DIAGNOSIS — R0602 Shortness of breath: Secondary | ICD-10-CM | POA: Diagnosis present

## 2023-12-06 DIAGNOSIS — I517 Cardiomegaly: Secondary | ICD-10-CM | POA: Insufficient documentation

## 2023-12-06 DIAGNOSIS — R0600 Dyspnea, unspecified: Secondary | ICD-10-CM | POA: Diagnosis not present

## 2023-12-06 LAB — ECHOCARDIOGRAM COMPLETE: S' Lateral: 3.2 cm

## 2023-12-06 NOTE — Progress Notes (Signed)
*  PRELIMINARY RESULTS* Echocardiogram 2D Echocardiogram has been performed.  Broadus Canes 12/06/2023, 11:45 AM

## 2023-12-14 ENCOUNTER — Other Ambulatory Visit: Payer: Self-pay

## 2023-12-15 ENCOUNTER — Other Ambulatory Visit: Payer: Self-pay

## 2023-12-15 ENCOUNTER — Ambulatory Visit: Admitting: Podiatry

## 2023-12-15 ENCOUNTER — Telehealth: Payer: Self-pay | Admitting: Family

## 2023-12-15 ENCOUNTER — Ambulatory Visit (INDEPENDENT_AMBULATORY_CARE_PROVIDER_SITE_OTHER): Admitting: Podiatry

## 2023-12-15 ENCOUNTER — Encounter: Payer: Self-pay | Admitting: Podiatry

## 2023-12-15 VITALS — Ht 68.0 in | Wt 390.2 lb

## 2023-12-15 DIAGNOSIS — L97511 Non-pressure chronic ulcer of other part of right foot limited to breakdown of skin: Secondary | ICD-10-CM | POA: Diagnosis not present

## 2023-12-15 DIAGNOSIS — L97501 Non-pressure chronic ulcer of other part of unspecified foot limited to breakdown of skin: Secondary | ICD-10-CM

## 2023-12-15 NOTE — Progress Notes (Signed)
 Chief Complaint  Patient presents with   Callouses    Pt is here due callus on the back of right great toe states it has come off but still wants it check out.    HPI: 43 y.o. male PMHx T2DM, last A1c 10/31/2023 was 6.8, presenting today for evaluation of a symptomatic callus to the plantar aspect of the right great toe.  Recently admitted into the hospital for pneumonia for about 1 week.  During that week he was off of his foot and he noticed a significant portion of the callus fell off.  Past Medical History:  Diagnosis Date   Abscess of upper back excluding scapular region    Acute respiratory distress syndrome (ARDS) due to COVID-19 virus (HCC) 11/2019   Cardiomyopathy (HCC)    Chronic hypoxic respiratory failure (HCC)    a. Uses 4lpm PRN.   Diabetes mellitus without complication (HCC)    GERD (gastroesophageal reflux disease)    OCC-NO MEDS   HFrEF (heart failure with reduced ejection fraction) (HCC)    a. 11/2019 Echo: EF 55-60%, no rwma, mild conc LVH, mod enlarged RV w/ nl fxn; b. 10/2023 Echo: EF 35-40%, glob HK, mod reduced RV fxn, mod dil, RVSP .   Hypertension    Obesity    Obesity hypoventilation syndrome (HCC)    OSA (obstructive sleep apnea)    Primary hypertension    Respiratory failure (HCC)    a. 11/2019 COVID-19 ->intubation->prolonged hospitalization. Not candidate for ECMO.   Sebaceous cyst    Type 2 diabetes mellitus (HCC)     Past Surgical History:  Procedure Laterality Date   IRRIGATION AND DEBRIDEMENT SEBACEOUS CYST Right 07/11/2018   Procedure: EXCISION  SEBACEOUS CYST POSTERIOR SHOULDER AND MID-BACK;  Surgeon: Franki Isles, MD;  Location: [facility] ORS;  Service: General;  Laterality: Right;   RIGHT/LEFT HEART CATH AND CORONARY ANGIOGRAPHY Bilateral 11/14/2023   Procedure: RIGHT/LEFT HEART CATH AND CORONARY ANGIOGRAPHY;  Surgeon: Mardell Shade, MD;  Location: [facility] INVASIVE CV LAB;  Service: Cardiovascular;  Laterality: Bilateral;    No Known  Allergies   Physical Exam: General: The patient is alert and oriented x3 in no acute distress.  Dermatology: Skin is warm, dry and supple bilateral lower extremities.  Hyperkeratotic preulcerative callus lesion noted to the plantar aspect of the right great toe  Vascular: Palpable pedal pulses bilaterally. Capillary refill within normal limits.  No appreciable edema.  No erythema.  Neurological: Grossly intact via light touch  Musculoskeletal Exam: No pedal deformities noted   Assessment/Plan of Care: 1.  Preulcerative callus lesion plantar aspect of the right great toe 2.  Diabetes mellitus; controlled  -Patient evaluated -Excisional debridement of the hyperkeratotic callus lesion was performed today using a 312 scalpel without incident or bleeding. -Advised the patient against going barefoot.  He admits to walking around the house barefoot the majority of the day.  Recommend good supportive tennis shoes and sneakers to offload pressure from the great toe -Return to clinic PRN       Dot Gazella, DPM [facility]  Dr. Dot Gazella, DPM    [address], [place] [address]  Office [phone]  Fax [phone]

## 2023-12-15 NOTE — Progress Notes (Unsigned)
 Advanced Heart Failure Clinic Note    PCP: Dionicia Frater, MD HF provider: Peder Bourdon, MD  Chief complaint:    HPI:  Mr Labrada is a 43 y.o.  male with a history of [name]/OSA, PVCs, nonischemic cardiomyopathy, type 2 diabetes, and prior COVID PNA with ARDS returns for post-hospital followup.  In 11/2019, he had prolonged hospitalization due to COVID PNA with development of septic shock and ARDS requiring intubation, further complicated by Acinetobacter HAP. Echo during admit showed LVEF 55-60%, mild LVH, RV moderately enlarged but systolic fx not well visualized. Required trach but able to decannulate. Discharged to CIR.      As noted above, he has severe OSA. Sleep study 7/21 demonstrated an AHI of 83. He was on CPAP but machine broke and went several years w/o treatment. He recently had repeat study done at [facility] which showed AHI of 211.3. During study, he was also noted to have frequent PVCs. He is now back on CPAP.    Pt was admitted in 4/25 with acute systolic heart failure. He presented with acute respiratory failure/ respiratory distress.  O2 sats in the 80s on 4L/. Placed on NRB then ultimately CPAP. He became increasing lethargic and was intubated for airway protection.  Suspect component of septic shock with Staph hominis bacteremia/PNA. Of note, prior to worsening respiratory failure, pt had also endorsed several month h/o worsening LEE and abdominal swelling/distention. Echo in 4/25 showed newly decreased LV EF to 35-40%, RV moderately dilated and moderately decreased systolic function.  Patient was diuresed and right/left heart catheterization done, no significant CAD. Patient was ultimately successfully extubated.    He presents today for a HF follow-up visit with a chief complaint of    Labs (4/25): K 3.9, creatinine 0.9, Lp(a) 67, LDL 140 Labs (11/20/23): K 4.9, creatinine 1.29  PMH: 1. [name]/OSA: Using 4 L home oxygen + CPAP.  2. PVCs 3. Type 2 diabetes 4.  HTN 5. Obesity 6. Chronic systolic CHF: Nonischemic cardiomyopathy, possibly related to PVCs.  - Echo (4/25) with EF 35-40%, RV moderately dilated and moderately decreased systolic function.  - LHC/RHC (4/25): No significant CAD; mean RA 4, PA 31/20, mean PCWP 3, CI 2.4.  7. COVID PNA with ARDS in 5/21.  Patient had tracheostomy, later decannulated.   Family History  Problem Relation Age of Onset   Coronary artery disease Maternal Uncle        premature, required CABG in 5s   CAD Maternal Uncle        permature, required CABG in 70s   Hypertension Father    Social History   Socioeconomic History   Marital status: Married    Spouse name: Thomas Fleischer   Number of children: 3   Years of education: Not on file   Highest education level: 12th grade  Occupational History   Occupation: Disability  Tobacco Use   Smoking status: Never    Passive exposure: Never   Smokeless tobacco: Never  Vaping Use   Vaping status: Never Used  Substance and Sexual Activity   Alcohol  use: Never   Drug use: Never   Sexual activity: Yes  Other Topics Concern   Not on file  Social History Narrative   ** Merged History Encounter **       Lives locally with mother.  Frequently eats processed and fast foods.   Social Drivers of Health   Financial Resource Strain: High Risk (11/08/2023)   Overall Financial Resource Strain (CARDIA)    Difficulty  of Paying Living Expenses: Hard  Food Insecurity: Food Insecurity Present (11/08/2023)   Hunger Vital Sign    Worried About Running Out of Food in the Last Year: Sometimes true    Ran Out of Food in the Last Year: Sometimes true  Transportation Needs: Unmet Transportation Needs (11/08/2023)   PRAPARE - Administrator, Civil Service (Medical): Yes    Lack of Transportation (Non-Medical): Yes  Physical Activity: Not on file  Stress: Not on file  Social Connections: Not on file  Intimate Partner Violence: Not At Risk (10/31/2023)   Humiliation,  Afraid, Rape, and Kick questionnaire    Fear of Current or Ex-Partner: No    Emotionally Abused: No    Physically Abused: No    Sexually Abused: No   ROS: All systems reviewed and negative except as per HPI.   Current Outpatient Medications  Medication Sig Dispense Refill   acetaminophen  (TYLENOL ) 325 MG tablet Take 1-2 tablets (325-650 mg total) by mouth every 4 (four) hours as needed for mild pain.     ADVAIR  HFA 115-21 MCG/ACT inhaler Inhale 2 puffs into the lungs 2 (two) times daily. 12 each 11   albuterol  (VENTOLIN  HFA) 108 (90 Base) MCG/ACT inhaler Inhale 2 puffs into the lungs every 6 (six) hours as needed for wheezing or shortness of breath. 8 g 2   aspirin  EC 81 MG tablet Take 1 tablet (81 mg total) by mouth daily. Swallow whole. 30 tablet 12   atorvastatin  (LIPITOR) 20 MG tablet Take 20 mg by mouth daily.     atorvastatin  (LIPITOR) 40 MG tablet Take 1 tablet (40 mg total) by mouth daily. 90 tablet 0   CYMBALTA  30 MG capsule TAKE 1 CAPSULE DAILY FOR 1 WEEK, THEN INCREASE TO 1 CAPSULE TWICE A DAY FOR MOOD.     dapagliflozin  propanediol (FARXIGA ) 10 MG TABS tablet Take 1 tablet (10 mg total) by mouth daily. 90 tablet 0   DULoxetine  (CYMBALTA ) 60 MG capsule Take 1 capsule (60 mg total) by mouth daily. 60 capsule 3   escitalopram (LEXAPRO) 5 MG tablet Take 5 mg by mouth daily.     gabapentin  (NEURONTIN ) 300 MG capsule Take 2 capsules (600 mg total) by mouth 3 (three) times daily. 90 capsule 1   gabapentin  (NEURONTIN ) 800 MG tablet Take 800 mg by mouth at bedtime.      ipratropium-albuterol  (DUONEB) 0.5-2.5 (3) MG/3ML SOLN Take 3 mLs by nebulization every 6 (six) hours as needed. 360 mL 0   metFORMIN  (GLUCOPHAGE ) 1000 MG tablet Take 1,000 mg by mouth 2 (two) times daily.     metFORMIN  (GLUCOPHAGE ) 1000 MG tablet Take 1,000 mg by mouth 2 (two) times daily.     metoprolol  succinate (TOPROL -XL) 50 MG 24 hr tablet Take 1 tablet (50 mg total) by mouth daily. Take with or immediately following  a meal. 90 tablet 3   potassium chloride  SA (KLOR-CON  M) 20 MEQ tablet Take 1 tablet (20 mEq total) by mouth 2 (two) times daily for 5 days. 10 tablet 0   sacubitril -valsartan  (ENTRESTO ) 24-26 MG Take 1 tablet by mouth 2 (two) times daily. 180 tablet 0   spironolactone  (ALDACTONE ) 25 MG tablet Take 1 tablet (25 mg total) by mouth daily. 90 tablet 0   tirzepatide  (MOUNJARO ) 12.5 MG/0.5ML Pen Inject 12.5 mg into the skin once a week. 2 mL 0   No current facility-administered medications for this visit.     Physical Exam:  General: NAD, obese.  Neck:  Thick. No JVD, no thyromegaly or thyroid nodule.  Lungs: Clear to auscultation bilaterally with normal respiratory effort. CV: Nondisplaced PMI.  Heart regular S1/S2, no S3/S4, no murmur.  No peripheral edema.  No carotid bruit.  Normal pedal pulses.  Abdomen: Soft, nontender, no hepatosplenomegaly, no distention.  Skin: Intact without lesions or rashes.  Neurologic: Alert and oriented x 3.  Psych: Normal affect. Extremities: No clubbing or cyanosis.  HEENT: Normal.   Assessment/Plan:  1. [name]/OSA: Patient is on home oxygen + CPAP.  Needs significant weight loss.  - He has started tirzepatide .  2.  Chronic systolic CHF: Nonischemic cardiomyopathy.  Echo in 4/25 showed new drop in EF to 35-40%, RV moderately dilated and moderately decreased systolic function. LHC in 4/25 with no significant coronary disease.  Patient has been noted to have frequent PVCs, ?PVC-related cardiomyopathy. NYHA class II, he is not volume overloaded on exam today.  - Continue Entresto  24/26 bid.  - Continue spironolactone  25 mg daily.  - Continue Toprol  XL to 50 mg daily.  - Continue dapagliflozin  10 mg daily.  - BMET today as previous BMET showed rising potassium/ creatinine  - Repeat Echo ~ July/ August to reassess LV EF.  - Zio monitor x 2 wks to assess PVC percentage.  3. PVCs: Frequent.  - Continue Toprol  XL as above.  - Zio monitor as above.     Charlette Console 12/15/2023      Charlette Console, FNP 12/15/23

## 2023-12-15 NOTE — Telephone Encounter (Signed)
 Called to confirm/remind patient of their appointment at the Advanced [facility] Clinic on 12/19/23.   Appointment:   [x] Confirmed  [] Left mess   [] No answer/No voice mail  [] VM Full/unable to leave message  [] Phone not in service  Patient reminded to bring all medications and/or complete list.  Confirmed patient has transportation. Gave directions, instructed to utilize valet parking.

## 2023-12-19 ENCOUNTER — Encounter: Admitting: Family

## 2023-12-19 ENCOUNTER — Other Ambulatory Visit: Payer: Self-pay

## 2023-12-19 ENCOUNTER — Ambulatory Visit: Attending: Family | Admitting: Family

## 2023-12-19 ENCOUNTER — Encounter: Payer: Self-pay | Admitting: Family

## 2023-12-19 VITALS — BP 92/43 | HR 93 | Wt 385.0 lb

## 2023-12-19 DIAGNOSIS — R42 Dizziness and giddiness: Secondary | ICD-10-CM | POA: Diagnosis not present

## 2023-12-19 DIAGNOSIS — Z5986 Financial insecurity: Secondary | ICD-10-CM | POA: Insufficient documentation

## 2023-12-19 DIAGNOSIS — I5032 Chronic diastolic (congestive) heart failure: Secondary | ICD-10-CM | POA: Insufficient documentation

## 2023-12-19 DIAGNOSIS — E119 Type 2 diabetes mellitus without complications: Secondary | ICD-10-CM | POA: Diagnosis not present

## 2023-12-19 DIAGNOSIS — Z7985 Long-term (current) use of injectable non-insulin antidiabetic drugs: Secondary | ICD-10-CM | POA: Insufficient documentation

## 2023-12-19 DIAGNOSIS — G4733 Obstructive sleep apnea (adult) (pediatric): Secondary | ICD-10-CM | POA: Insufficient documentation

## 2023-12-19 DIAGNOSIS — R002 Palpitations: Secondary | ICD-10-CM | POA: Diagnosis not present

## 2023-12-19 DIAGNOSIS — Z5982 Transportation insecurity: Secondary | ICD-10-CM | POA: Insufficient documentation

## 2023-12-19 DIAGNOSIS — Z7984 Long term (current) use of oral hypoglycemic drugs: Secondary | ICD-10-CM | POA: Insufficient documentation

## 2023-12-19 DIAGNOSIS — Z7982 Long term (current) use of aspirin: Secondary | ICD-10-CM | POA: Insufficient documentation

## 2023-12-19 DIAGNOSIS — I110 Hypertensive heart disease with heart failure: Secondary | ICD-10-CM | POA: Diagnosis not present

## 2023-12-19 DIAGNOSIS — Z79899 Other long term (current) drug therapy: Secondary | ICD-10-CM | POA: Diagnosis not present

## 2023-12-19 DIAGNOSIS — G4736 Sleep related hypoventilation in conditions classified elsewhere: Secondary | ICD-10-CM | POA: Diagnosis not present

## 2023-12-19 DIAGNOSIS — I493 Ventricular premature depolarization: Secondary | ICD-10-CM | POA: Insufficient documentation

## 2023-12-19 DIAGNOSIS — Z8701 Personal history of pneumonia (recurrent): Secondary | ICD-10-CM | POA: Diagnosis not present

## 2023-12-19 DIAGNOSIS — Z9981 Dependence on supplemental oxygen: Secondary | ICD-10-CM | POA: Insufficient documentation

## 2023-12-19 DIAGNOSIS — I428 Other cardiomyopathies: Secondary | ICD-10-CM | POA: Diagnosis not present

## 2023-12-19 DIAGNOSIS — Z8616 Personal history of COVID-19: Secondary | ICD-10-CM | POA: Insufficient documentation

## 2023-12-19 MED ORDER — TIRZEPATIDE 12.5 MG/0.5ML ~~LOC~~ SOAJ
12.5000 mg | SUBCUTANEOUS | 5 refills | Status: DC
  Filled 2023-12-19: qty 2, 28d supply, fill #0
  Filled 2024-01-09 (×2): qty 2, 28d supply, fill #1

## 2023-12-19 NOTE — Patient Instructions (Signed)
 Medication Changes:  No medication changes.   Lab Work:  Go DOWN to LOWER LEVEL (LL) to have your blood work completed inside of [employer] office.  We will only call you if the results are abnormal or if the provider would like to make medication changes.   Follow-Up in: 2 months with Dr. Mitzie Anda.  Our Doctors' schedules are NOT open yet for 2 months. We will place you on our recall list. Once they are available, we will call you to schedule your follow up appointment.   At the Advanced [facility] Failure Clinic, you and your health needs are our priority. We have a designated team specialized in the treatment of Heart Failure. This Care Team includes your primary Heart Failure Specialized Cardiologist (physician), Advanced Practice Providers (APPs- Physician Assistants and Nurse Practitioners), and Pharmacist who all work together to provide you with the care you need, when you need it.   You may see any of the following providers on your designated Care Team at your next follow up:  Dr. Jules Oar Dr. Peder Bourdon Dr. Alwin Baars Dr. Judyth Nunnery Shawnee Dellen, FNP Bevely Brush, RPH-CPP  Please be sure to bring in all your medications bottles to every appointment.   Need to Contact Us :  If you have any questions or concerns before your next appointment please send us  a message through [place] or call our office at [phone].    TO LEAVE A MESSAGE FOR THE NURSE SELECT OPTION 2, PLEASE LEAVE A MESSAGE INCLUDING: YOUR NAME DATE OF BIRTH CALL BACK NUMBER REASON FOR CALL**this is important as we prioritize the call backs  YOU WILL RECEIVE A CALL BACK THE SAME DAY AS LONG AS YOU CALL BEFORE 4:00 PM

## 2023-12-20 ENCOUNTER — Ambulatory Visit: Payer: Self-pay | Admitting: Family

## 2023-12-20 LAB — BASIC METABOLIC PANEL WITH GFR
BUN/Creatinine Ratio: 12 (ref 9–20)
BUN: 10 mg/dL (ref 6–24)
CO2: 18 mmol/L — ABNORMAL LOW (ref 20–29)
Calcium: 9.4 mg/dL (ref 8.7–10.2)
Chloride: 103 mmol/L (ref 96–106)
Creatinine, Ser: 0.83 mg/dL (ref 0.76–1.27)
Glucose: 103 mg/dL — ABNORMAL HIGH (ref 70–99)
Potassium: 4.3 mmol/L (ref 3.5–5.2)
Sodium: 140 mmol/L (ref 134–144)
eGFR: 113 mL/min/{1.73_m2} (ref >59)

## 2023-12-25 NOTE — Addendum Note (Signed)
 Encounter addended by: Stan Eans, RN on: 12/25/2023 11:51 AM  Actions taken: Imaging Exam ended

## 2023-12-31 ENCOUNTER — Ambulatory Visit (HOSPITAL_COMMUNITY): Payer: Self-pay | Admitting: Cardiology

## 2024-01-02 NOTE — Telephone Encounter (Signed)
 Spoke with patients wife and made her aware of the following results. Advised her to call back with issues and she verbalized understanding.

## 2024-01-02 NOTE — Telephone Encounter (Signed)
-----   Message from Peder Bourdon sent at 12/31/2023  9:05 PM EDT ----- No worrisome arrhythmias.

## 2024-01-09 ENCOUNTER — Other Ambulatory Visit (HOSPITAL_COMMUNITY): Payer: Self-pay

## 2024-01-09 ENCOUNTER — Other Ambulatory Visit: Payer: Self-pay

## 2024-01-29 ENCOUNTER — Other Ambulatory Visit: Payer: Self-pay | Admitting: Family

## 2024-01-29 ENCOUNTER — Other Ambulatory Visit: Payer: Self-pay

## 2024-01-29 MED FILL — Dapagliflozin Propanediol Tab 10 MG (Base Equivalent): ORAL | 90 days supply | Qty: 90 | Fill #0 | Status: AC

## 2024-01-29 MED FILL — Atorvastatin Calcium Tab 40 MG (Base Equivalent): ORAL | 90 days supply | Qty: 90 | Fill #0 | Status: AC

## 2024-01-29 MED FILL — Spironolactone Tab 25 MG: ORAL | 90 days supply | Qty: 90 | Fill #0 | Status: AC

## 2024-01-29 MED FILL — Sacubitril-Valsartan Tab 24-26 MG: ORAL | 90 days supply | Qty: 180 | Fill #0 | Status: AC

## 2024-01-30 ENCOUNTER — Encounter: Payer: Self-pay | Admitting: Pulmonary Disease

## 2024-01-30 ENCOUNTER — Ambulatory Visit (INDEPENDENT_AMBULATORY_CARE_PROVIDER_SITE_OTHER): Admitting: Pulmonary Disease

## 2024-01-30 ENCOUNTER — Other Ambulatory Visit: Payer: Self-pay

## 2024-01-30 VITALS — BP 126/90 | HR 82 | Temp 98.5°F | Ht 68.0 in | Wt 366.0 lb

## 2024-01-30 DIAGNOSIS — G4730 Sleep apnea, unspecified: Secondary | ICD-10-CM

## 2024-01-30 DIAGNOSIS — I420 Dilated cardiomyopathy: Secondary | ICD-10-CM

## 2024-01-30 DIAGNOSIS — J4530 Mild persistent asthma, uncomplicated: Secondary | ICD-10-CM

## 2024-01-30 DIAGNOSIS — E662 Morbid (severe) obesity with alveolar hypoventilation: Secondary | ICD-10-CM

## 2024-01-30 NOTE — Patient Instructions (Signed)
 VISIT SUMMARY:  Today, you had a follow-up appointment to review your conditions, including obesity hypoventilation syndrome, severe obstructive sleep apnea, and asthma. You reported feeling better overall, with improved alertness and activity levels. Your breathing has been stable, and your asthma is well controlled. You are also working on increasing your physical activity based on exercises from a previous pulmonary rehab program.  YOUR PLAN:  -OBESITY HYPOVENTILATION SYNDROME: Obesity hypoventilation syndrome is a condition where poor breathing due to obesity leads to low oxygen and high carbon dioxide levels in the blood. You are doing well with your noninvasive ventilator, which you should continue using as prescribed.  You have had excellent results with the Mounjaro  for your weight loss which will correct this issue in time.  -SEVERE OBSTRUCTIVE SLEEP APNEA: Severe obstructive sleep apnea is a condition where the airway becomes blocked during sleep, causing breathing to stop and start repeatedly. Your condition has improved with the use of a ventilator, which you should continue using for sleep apnea management.  -INTERMITTENT ASTHMA: Intermittent asthma is a type of asthma where symptoms occur occasionally, often triggered by specific factors. Your asthma is well controlled, but you should use your emergency inhaler as needed for shortness of breath and use Advair  twice daily if your symptoms increase.  -GENERAL HEALTH MAINTENANCE: You are working on increasing your physical activity by incorporating exercises from a previous pulmonary rehab program. If needed, consider a referral to a pulmonary rehab program for additional support.  It is reassuring to see you doing so well.  INSTRUCTIONS:  Please continue using your noninvasive ventilator and ventilator for sleep apnea as prescribed. Use your emergency inhaler as needed and Advair  twice daily if your asthma symptoms increase. Keep working  on increasing your physical activity, and consider a referral to a pulmonary rehab program if you need additional support.

## 2024-01-30 NOTE — Progress Notes (Unsigned)
 Subjective:    Patient ID: Clifford Nichols, male    DOB: [birth year], 43 y.o.   MRN: [id number]  Patient Care Team: Clifford Stefano Iles, MD as PCP - General (Family Medicine) Clifford Evalene PARAS, MD as PCP - Cardiology (Cardiology) Clifford Dedra LITTIE, MD as Consulting Physician (Pulmonary Disease) Clifford Stefano Iles, MD (Family Medicine)  Chief Complaint  Patient presents with   Follow-up    BACKGROUND/INTERVAL:The patient is a 43 year old morbidly obese gentleman who presents for follow-up of hypoxemia. Previously evaluated by Dr. Carolynne Nichols for severe obstructive sleep apnea. Past history significant for ARDS from COVID-19 pneumonia in 2021.  He had been on oxygen since then. Required tracheostomy during his COVID-19 hospitalization.  On a visit of 19 October 2023  he was noted to have oxygen saturations of 80% on room air and his sleep study that had been done showed an AHI of 211.3.  He was set up with oxygen and BiPAP at that time.  He had an exacerbation on 01 November 2023 was admitted to the ICU and required intubation and mechanical ventilation.  He had bilateral pneumonia.  He was also noted to have a cardiomyopathy.  He was last seen on 28 Nov 2023 posthospitalization.  He is on a vent tech V+ pro noninvasive ventilator.  HPI Discussed the use of AI scribe software for clinical note transcription with the patient, who gave verbal consent to proceed.  History of Present Illness   Clifford Nichols is a 43 year old male with obesity hypoventilation syndrome, severe obstructive sleep apnea, and asthma who presents for follow-up. He is accompanied by his mother, Clifford Nichols.  His breathing has been stable with less frequent use of his inhaler due to a decrease in shortness of breath. No shortness of breath, and his asthma is well controlled with current management.  No cough or sputum production.  He is compliant with his noninvasive ventilator use, which he feels is improving his alertness and  overall condition. He notes an improvement in his ability to stay awake longer and is attempting to increase his physical activity.  His compliance download on the V+ Pro ventilator is at 100%.  He averages 9 hours of usage per day.  He recalls participating in a pulmonary rehab program two years ago and is currently trying to incorporate some of the exercises he learned there into his routine. He does not have a prescribed exercise program but is doing what he can to increase his activity level.  He is on Mounjaro  and has lost approximately 40 pounds of weight on the medication which has helped his respiratory status.     Vent tech V+ pro ventilator settings: target tidal volume 550, PS minimum 6, PS max 14, auto EPAP 5-15, backup respiratory rate 10, sensitivity 3, rise time 4, leak compensation present.   DATA 09/30/2023 in-lab sleep study: Severe sleep apnea with AHI of 211.3 oxygen nadir of 92%.  Patient was titrated to BiPAP. 10/19/2023 ABG on RA: pH 7.35, pCO2 65, PaO2 64, calculated bicarb 35.9, O2 sat 92.3% 10/31/2023 CT angio chest: Limited study without evidence of pulmonary embolism.  Bilateral lower lobe scarring, atelectasis and/or infiltrate.  Mild right middle lobe atelectatic changes.  Properly positioned endotracheal and nasogastric tubes. 10/31/2023 echocardiogram: LVEF 35 to 40%, LV with moderately decreased function.  Global hypokinesis of the LV.  Mild LVH.  Indeterminate diastolic parameters.  RV systolic function moderately reduced.  RV size moderately enlarged.  Normal pulmonary artery systolic pressure.  No mitral or aortic valve abnormalities. 11/14/2023 right and left heart cath: Minimal CAD.  Well compensated hemodynamics with low filling pressures. 11/28/2023 PFTs: FEV1 2.59 L or 65% predicted, FVC 3.27 L or 65% predicted, FEV1/FVC 79%, there is hyperinflation and air trapping evident on lung volumes.  No significant bronchodilator response.  Diffusion capacity is mildly  reduced however it corrects for alveolar volume to normal.  ERV is decreased consistent with obesity. 12/06/2023 echocardiogram: LVEF 50 to 55%.  LV has low normal function.  LV has no regional wall motion abnormalities.  Mild left ventricular hypertrophy.  Determinate left ventricular diastolic parameters.  RV function is normal.  RV size is normal.  No valvular abnormalities.  Review of Systems A 10 point review of systems was performed and it is as noted above otherwise negative.   Patient Active Problem List   Diagnosis Date Noted   Acute respiratory failure with hypoxia and hypercapnia (HCC) 11/12/2023   Acute sepsis (HCC) 11/12/2023   Septic shock (HCC) 11/12/2023   Dilated cardiomyopathy (HCC) 11/12/2023   Obesity hypoventilation syndrome (HCC) 11/12/2023   Sleep apnea 11/12/2023   Acute on chronic combined systolic and diastolic CHF (congestive heart failure) (HCC) 11/12/2023   Acute on chronic respiratory failure with hypoxia and hypercapnia (HCC) 10/31/2023   History of COVID-19 05/14/2020   Foot pain (Bilateral) 04/07/2020   Chronic pain syndrome 04/05/2020   Pharmacologic therapy 04/05/2020   Disorder of skeletal system 04/05/2020   Problems influencing health status 04/05/2020   Long term prescription opiate use 04/05/2020   Uncomplicated opioid dependence (HCC) 04/05/2020   Opioid use (90 MME) 04/05/2020   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/2020   Neurogenic pain 04/05/2020   Chronic neuropathic pain 04/05/2020   Morbid obesity with BMI of 50.0-59.9, adult (HCC) 01/27/2020   OSA (obstructive sleep apnea) 01/27/2020   Critical illness polyneuropathy (HCC) 01/13/2020   Debility 01/13/2020   Status post tracheostomy (HCC)    Pressure injury of skin 12/27/2019   Acute on chronic respiratory failure with hypoxia and hypercapnia (HCC)    Acute respiratory failure (HCC)    ARDS (adult respiratory distress syndrome) (HCC) 12/04/2019   COVID-19 12/03/2019    Acute respiratory failure due to COVID-19 (HCC) 12/02/2019   Hyperglycemia 12/02/2019   Diabetes (HCC) 12/02/2019   HTN (hypertension) 04/27/2018    Social History   Tobacco Use   Smoking status: Never    Passive exposure: Never   Smokeless tobacco: Never  Substance Use Topics   Alcohol  use: Never    No Known Allergies  Current Meds  Medication Sig   acetaminophen  (TYLENOL ) 325 MG tablet Take 1-2 tablets (325-650 mg total) by mouth every 4 (four) hours as needed for mild pain.   ADVAIR  HFA 115-21 MCG/ACT inhaler Inhale 2 puffs into the lungs 2 (two) times daily.   albuterol  (VENTOLIN  HFA) 108 (90 Base) MCG/ACT inhaler Inhale 2 puffs into the lungs every 6 (six) hours as needed for wheezing or shortness of breath.   aspirin  EC 81 MG tablet Take 1 tablet (81 mg total) by mouth daily. Swallow whole.   atorvastatin  (LIPITOR) 20 MG tablet Take 20 mg by mouth daily.   atorvastatin  (LIPITOR) 40 MG tablet Take 1 tablet (40 mg total) by mouth daily.   CYMBALTA  30 MG capsule TAKE 1 CAPSULE DAILY FOR 1 WEEK, THEN INCREASE TO 1 CAPSULE TWICE A DAY FOR MOOD.   dapagliflozin  propanediol (FARXIGA ) 10 MG TABS tablet Take 1 tablet (10 mg total) by  mouth daily.   DULoxetine  (CYMBALTA ) 60 MG capsule Take 1 capsule (60 mg total) by mouth daily.   escitalopram (LEXAPRO) 5 MG tablet Take 5 mg by mouth daily.   gabapentin  (NEURONTIN ) 300 MG capsule Take 2 capsules (600 mg total) by mouth 3 (three) times daily.   gabapentin  (NEURONTIN ) 800 MG tablet Take 800 mg by mouth at bedtime.    metFORMIN  (GLUCOPHAGE ) 1000 MG tablet Take 1,000 mg by mouth 2 (two) times daily.   metoprolol  succinate (TOPROL -XL) 50 MG 24 hr tablet Take 1 tablet (50 mg total) by mouth daily. Take with or immediately following a meal.   potassium chloride  SA (KLOR-CON  M) 20 MEQ tablet Take 1 tablet (20 mEq total) by mouth 2 (two) times daily for 5 days.   sacubitril -valsartan  (ENTRESTO ) 24-26 MG Take 1 tablet by mouth 2 (two) times  daily.   spironolactone  (ALDACTONE ) 25 MG tablet Take 1 tablet (25 mg total) by mouth daily.   tirzepatide  (MOUNJARO ) 12.5 MG/0.5ML Pen Inject 12.5 mg into the skin once a week.    Immunization History  Administered Date(s) Administered   Influenza-Unspecified 07/26/2023   PNEUMOCOCCAL CONJUGATE-20 11/28/2023        Objective:     BP (!) 126/90 (BP Location: Left Arm, Patient Position: Sitting, Cuff Size: Normal)   Pulse 82   Temp 98.5 F (36.9 C) (Oral)   Ht 5' 8 (1.727 m)   Wt (!) 366 lb (166 kg)   SpO2 97%   BMI 55.65 kg/m   SpO2: 97 %  GENERAL: Morbidly obese gentleman, presents in transport chair though able to ambulate better than prior.  No respiratory distress.  No conversational dyspnea.  Awake, alert, conversant. HEAD: Normocephalic, atraumatic.  EYES: Pupils equal, round, reactive to light.  No scleral icterus.  MOUTH: Dentition intact, oral mucosa moist.  No thrush. NECK: Supple. No thyromegaly. Trachea midline.  Cannot assess JVD.  Very thick neck.  No adenopathy.  Prior tracheotomy scar well healed. PULMONARY: Good air entry bilaterally.  No adventitious sounds. CARDIOVASCULAR: S1 and S2.  Tachycardic, regular rate, no rubs murmurs gallops heard.  ABDOMEN: Extremely obese, soft.  No other assessment possible. MUSCULOSKELETAL: No joint deformity, no clubbing, no edema.  NEUROLOGIC: No overt neurodeficit.  No asterixis. SKIN: Intact,warm,dry. PSYCH: Mood and behavior normal.   Nitric oxide  (FeNO): 5 ppb *No evidence of type II inflammation present   Assessment & Plan:     ICD-10-CM   1. Severe sleep apnea  G47.30     2. Extreme obesity with alveolar hypoventilation (HCC)  E66.2     3. Mild persistent asthma without complication  J45.30     4. Dilated cardiomyopathy (HCC)  I42.0      Orders Placed This Encounter  Procedures   Nitric oxide    Discussion:    Obesity hypoventilation syndrome Geo is doing well with his noninvasive  ventilator, using it consistently, which is likely contributing to his improved condition. He is feeling better and more active, indicating effective management of his obesity hypoventilation syndrome. - Continue use of noninvasive ventilator as prescribed  Severe obstructive sleep apnea Trong's severe obstructive sleep apnea is being managed effectively with the use of a ventilator. He was previously experiencing significant apnea episodes, but his condition has improved, as evidenced by increased alertness and activity levels. He was quitting breathing 211 times per hour prior to treatment. - Continue use of ventilator for sleep apnea management  Intermittent asthma Samarion's asthma is currently well controlled, with no  airway inflammation detected.  Nitric oxide  level today was only 5 ppb, he has intermittent asthma, which means symptoms occur sporadically, often triggered by specific exposures. He has an emergency inhaler for acute symptoms and is advised to use Advair  regularly if symptoms increase. - Use emergency inhaler as needed for shortness of breath - Use Advair  twice daily if shortness of breath increases  General Health Maintenance Handsome is trying to increase his physical activity. He recalls exercises from a previous pulmonary rehab program and is implementing them independently. - Consider referral to pulmonary rehab if needed      Advised if symptoms do not improve or worsen, to please contact office for sooner follow up or seek emergency care.    I spent 40 minutes of dedicated to the care of this patient on the date of this encounter to include pre-visit review of records, face-to-face time with the patient discussing conditions above, post visit ordering of testing, clinical documentation with the electronic health record, making appropriate referrals as documented, and communicating necessary findings to members of the patients care team.   C. Leita Sanders,  MD Advanced Bronchoscopy PCCM [facility]    *This note was generated using voice recognition software/Dragon and/or AI transcription program.  Despite best efforts to proofread, errors can occur which can change the meaning. Any transcriptional errors that result from this process are unintentional and may not be fully corrected at the time of dictation.

## 2024-01-31 ENCOUNTER — Other Ambulatory Visit: Payer: Self-pay

## 2024-01-31 MED ORDER — MOUNJARO 15 MG/0.5ML ~~LOC~~ SOAJ
15.0000 mg | SUBCUTANEOUS | 11 refills | Status: AC
  Filled 2024-01-31 – 2024-02-01 (×2): qty 2, 28d supply, fill #0
  Filled 2024-03-05: qty 2, 28d supply, fill #1
  Filled 2024-04-02: qty 2, 28d supply, fill #2
  Filled 2024-05-02 (×2): qty 2, 28d supply, fill #3
  Filled 2024-05-27 – 2024-06-03 (×2): qty 2, 28d supply, fill #4
  Filled 2024-06-26: qty 2, 28d supply, fill #5
  Filled 2024-08-04: qty 2, 28d supply, fill #6

## 2024-02-01 ENCOUNTER — Encounter: Payer: Self-pay | Admitting: Pulmonary Disease

## 2024-02-01 ENCOUNTER — Other Ambulatory Visit: Payer: Self-pay

## 2024-02-01 ENCOUNTER — Other Ambulatory Visit (HOSPITAL_COMMUNITY): Payer: Self-pay

## 2024-02-01 LAB — NITRIC OXIDE: Nitric Oxide: 5

## 2024-02-02 ENCOUNTER — Other Ambulatory Visit: Payer: Self-pay

## 2024-03-05 ENCOUNTER — Other Ambulatory Visit (HOSPITAL_COMMUNITY): Payer: Self-pay

## 2024-03-26 ENCOUNTER — Other Ambulatory Visit: Payer: Self-pay

## 2024-04-02 ENCOUNTER — Other Ambulatory Visit (HOSPITAL_COMMUNITY): Payer: Self-pay

## 2024-04-12 ENCOUNTER — Ambulatory Visit: Attending: Cardiology | Admitting: Cardiology

## 2024-04-12 ENCOUNTER — Other Ambulatory Visit: Payer: Self-pay

## 2024-04-12 VITALS — BP 137/76 | HR 77 | Wt 345.0 lb

## 2024-04-12 DIAGNOSIS — Z8616 Personal history of COVID-19: Secondary | ICD-10-CM | POA: Insufficient documentation

## 2024-04-12 DIAGNOSIS — I493 Ventricular premature depolarization: Secondary | ICD-10-CM | POA: Diagnosis not present

## 2024-04-12 DIAGNOSIS — I5022 Chronic systolic (congestive) heart failure: Secondary | ICD-10-CM | POA: Diagnosis present

## 2024-04-12 DIAGNOSIS — Z7985 Long-term (current) use of injectable non-insulin antidiabetic drugs: Secondary | ICD-10-CM | POA: Insufficient documentation

## 2024-04-12 DIAGNOSIS — G4733 Obstructive sleep apnea (adult) (pediatric): Secondary | ICD-10-CM | POA: Insufficient documentation

## 2024-04-12 DIAGNOSIS — Z7984 Long term (current) use of oral hypoglycemic drugs: Secondary | ICD-10-CM | POA: Diagnosis not present

## 2024-04-12 DIAGNOSIS — I5032 Chronic diastolic (congestive) heart failure: Secondary | ICD-10-CM | POA: Diagnosis not present

## 2024-04-12 DIAGNOSIS — I4510 Unspecified right bundle-branch block: Secondary | ICD-10-CM | POA: Insufficient documentation

## 2024-04-12 DIAGNOSIS — Z79899 Other long term (current) drug therapy: Secondary | ICD-10-CM | POA: Diagnosis not present

## 2024-04-12 DIAGNOSIS — Z8249 Family history of ischemic heart disease and other diseases of the circulatory system: Secondary | ICD-10-CM | POA: Diagnosis not present

## 2024-04-12 DIAGNOSIS — Z9981 Dependence on supplemental oxygen: Secondary | ICD-10-CM | POA: Insufficient documentation

## 2024-04-12 DIAGNOSIS — E119 Type 2 diabetes mellitus without complications: Secondary | ICD-10-CM | POA: Insufficient documentation

## 2024-04-12 DIAGNOSIS — I428 Other cardiomyopathies: Secondary | ICD-10-CM | POA: Insufficient documentation

## 2024-04-12 DIAGNOSIS — Z5986 Financial insecurity: Secondary | ICD-10-CM | POA: Insufficient documentation

## 2024-04-12 DIAGNOSIS — Z5982 Transportation insecurity: Secondary | ICD-10-CM | POA: Diagnosis not present

## 2024-04-12 LAB — BASIC METABOLIC PANEL WITH GFR
BUN/Creatinine Ratio: 9 (ref 9–20)
BUN: 9 mg/dL (ref 6–24)
CO2: 19 mmol/L — ABNORMAL LOW (ref 20–29)
Calcium: 9.8 mg/dL (ref 8.7–10.2)
Chloride: 103 mmol/L (ref 96–106)
Creatinine, Ser: 0.99 mg/dL (ref 0.76–1.27)
Glucose: 95 mg/dL (ref 70–99)
Potassium: 4.7 mmol/L (ref 3.5–5.2)
Sodium: 141 mmol/L (ref 134–144)
eGFR: 98 mL/min/1.73 (ref >59)

## 2024-04-12 MED ORDER — SACUBITRIL-VALSARTAN 49-51 MG PO TABS
1.0000 | ORAL_TABLET | Freq: Two times a day (BID) | ORAL | 6 refills | Status: AC
  Filled 2024-04-12 – 2024-04-22 (×2): qty 60, 30d supply, fill #0
  Filled 2024-05-27 – 2024-06-03 (×2): qty 60, 30d supply, fill #1
  Filled 2024-06-24: qty 60, 30d supply, fill #2
  Filled 2024-06-24: qty 60, 30d supply, fill #0
  Filled 2024-07-24 (×2): qty 60, 30d supply, fill #1
  Filled 2024-08-27: qty 60, 30d supply, fill #2

## 2024-04-12 NOTE — Patient Instructions (Addendum)
 Medication Changes:  INCREASE Entresto  49/51mg  (1 tab) two times daily  Lab Work:  Go over to the [facility]ICAL [facility]ALL. Go pass the gift shop and have your blood work completed IN TWO WEEKS.  Go downstairs to [facility] on LOWER LEVEL to have your blood work completed TODAY.  We will only call you if the results are abnormal or if the provider would like to make medication changes.  No news is good news.   Testing/Procedures:  Your physician has requested that you have an echocardiogram. Echocardiography is a painless test that uses sound waves to create images of your heart. It provides your doctor with information about the size and shape of your heart and how well your heart's chambers and valves are working. This procedure takes approximately one hour. There are no restrictions for this procedure. Please do NOT wear cologne, perfume, aftershave, or lotions (deodorant is allowed). Please arrive 15 minutes prior to your appointment time.  Please note: We ask at that you not bring children with you during ultrasound (echo/ vascular) testing. Due to room size and safety concerns, children are not allowed in the ultrasound rooms during exams. Our front office staff cannot provide observation of children in our lobby area while testing is being conducted. An adult accompanying a patient to their appointment will only be allowed in the ultrasound room at the discretion of the ultrasound technician under special circumstances. We apologize for any inconvenience.  Someone will contact you in order to schedule your appointment.  Follow-Up in: Please follow up with the [facility] in 6 months with Dr. Rolan and an echocardiogram. We do not currently have that schedule. If you have not heard from us  by February 2026, please call us  to schedule your appointment for March 2026.   Thank you for choosing [facility].    At the [facility], you and your health needs are our priority. We have a designated team specialized in the treatment of Heart Failure. This Care Team includes your primary Heart Failure Specialized Cardiologist (physician), Advanced Practice Providers (APPs- Physician Assistants and Nurse Practitioners), and Pharmacist who all work together to provide you with the care you need, when you need it.   You may see any of the following providers on your designated Care Team at your next follow up:  Dr. Toribio Fuel Dr. Ezra Rolan Dr. Ria Commander Dr. Morene Brownie Ellouise Class, FNP Jaun Bash, RPH-CPP  Please be sure to bring in all your medications bottles to every appointment.   Need to Contact Us :  If you have any questions or concerns before your next appointment please send us  a message through [place] or call our office at [phone].    TO LEAVE A MESSAGE FOR THE NURSE SELECT OPTION 2, PLEASE LEAVE A MESSAGE INCLUDING: YOUR NAME DATE OF BIRTH CALL BACK NUMBER REASON FOR CALL**this is important as we prioritize the call backs  YOU WILL RECEIVE A CALL BACK THE SAME DAY AS LONG AS YOU CALL BEFORE 4:00 PM

## 2024-04-12 NOTE — Progress Notes (Signed)
 ekg

## 2024-04-12 NOTE — Progress Notes (Signed)
 PCP: Kandis Stefano Iles, MD  Chief complaint: CHF  43 y.o. with history of [name]/OSA, PVCs, nonischemic cardiomyopathy, type 2 diabetes, and prior COVID PNA with ARDS returns for post-hospital followup.  In 11/2019, he had prolonged hospitalization due to COVID PNA with development of septic shock and ARDS requiring intubation, further complicated by Acinetobacter HAP. Echo during admit showed LVEF 55-60%, mild LVH, RV moderately enlarged but systolic fx not well visualized. Required trach but able to decannulate. Discharged to CIR.      As noted above, he has severe OSA. Sleep study 7/21 demonstrated an AHI of 83. He was on CPAP but machine broke and went several years w/o treatment. He recently had repeat study done at [facility] which showed AHI of 211.3. During study, he was also noted to have frequent PVCs. He is now back on CPAP.    Pt was admitted in 4/25 with acute systolic heart failure. He presented with acute respiratory failure/ respiratory distress.  O2 sats in the 80s on 4L/[place]. Placed on NRB then ultimately CPAP. He became increasing lethargic and was intubated for airway protection.  Suspect component of septic shock with Staph hominis bacteremia/PNA. Of note, prior to worsening respiratory failure, pt had also endorsed several month h/o worsening LEE and abdominal swelling/distention. Echo in 4/25 showed newly decreased LV EF to 35-40%, RV moderately dilated and moderately decreased systolic function.  Patient was diuresed and right/left heart catheterization done, no significant CAD. Patient was ultimately successfully extubated.    Zio monitor in 6/25 showed 1 short NSVT run, rare PVCs.  Echo in 5/25 showed EF 50-55%, mild LVH, normal RV, IVC normal.   Patient returns for followup of CHF.  He has lost 47 lbs since last appointment on tirzepatide .  He is walking more, still gets tired with long distances.  No dyspnea with usual ADLs.  No chest pain.  No lightheadedness.  Only using  oxygen sometimes with exertion.  He wears CPAP every night. Oxygen saturation 93-98% at rest today.   ECG (personally reviewed): NSR, poor RWP.   Labs (4/25): K 3.9, creatinine 0.9, Lp(a) 67, LDL 140, BNP 11 Labs (5/25): K 4.3, creatinine 0.83  PMH: 1. [name]/OSA: Using 4 L home oxygen + CPAP.  2. PVCs - Zio monitor (5/25): 1 short NSVT run, rare PVCs.  3. Type 2 diabetes 4. HTN 5. Obesity 6. Chronic systolic CHF: Nonischemic cardiomyopathy, possibly related to PVCs.  - Echo (4/25) with EF 35-40%, RV moderately dilated and moderately decreased systolic function.  - LHC/RHC (4/25): No significant CAD; mean RA 4, PA 31/20, mean PCWP 3, CI 2.4.  - Echo (5/25): EF 50-55%, mild LVH, normal RV, IVC normal.  7. COVID PNA with ARDS in 5/21.  Patient had tracheostomy, later decannulated.   Family History  Problem Relation Age of Onset   Coronary artery disease Maternal Uncle        premature, required CABG in 35s   CAD Maternal Uncle        permature, required CABG in 46s   Hypertension Father    Social History   Socioeconomic History   Marital status: Married    Spouse name: Jacqui   Number of children: 3   Years of education: Not on file   Highest education level: 12th grade  Occupational History   Occupation: Disability  Tobacco Use   Smoking status: Never    Passive exposure: Never   Smokeless tobacco: Never  Vaping Use   Vaping status: Never Used  Substance and Sexual Activity   Alcohol  use: Never   Drug use: Never   Sexual activity: Yes  Other Topics Concern   Not on file  Social History Narrative   ** Merged History Encounter **       Lives locally with mother.  Frequently eats processed and fast foods.   Social Drivers of Corporate investment banker Strain: High Risk (11/08/2023)   Overall Financial Resource Strain (CARDIA)    Difficulty of Paying Living Expenses: Hard  Food Insecurity: Food Insecurity Present (11/08/2023)   Hunger Vital Sign    Worried About  Running Out of Food in the Last Year: Sometimes true    Ran Out of Food in the Last Year: Sometimes true  Transportation Needs: Unmet Transportation Needs (11/08/2023)   PRAPARE - Administrator, Civil Service (Medical): Yes    Lack of Transportation (Non-Medical): Yes  Physical Activity: Not on file  Stress: Not on file  Social Connections: Not on file  Intimate Partner Violence: Not At Risk (10/31/2023)   Humiliation, Afraid, Rape, and Kick questionnaire    Fear of Current or Ex-Partner: No    Emotionally Abused: No    Physically Abused: No    Sexually Abused: No   ROS: All systems reviewed and negative except as per HPI.   Current Outpatient Medications  Medication Sig Dispense Refill   acetaminophen  (TYLENOL ) 325 MG tablet Take 1-2 tablets (325-650 mg total) by mouth every 4 (four) hours as needed for mild pain.     ADVAIR  HFA 115-21 MCG/ACT inhaler Inhale 2 puffs into the lungs 2 (two) times daily. 12 each 11   albuterol  (VENTOLIN  HFA) 108 (90 Base) MCG/ACT inhaler Inhale 2 puffs into the lungs every 6 (six) hours as needed for wheezing or shortness of breath. 8 g 2   aspirin  EC 81 MG tablet Take 1 tablet (81 mg total) by mouth daily. Swallow whole. 30 tablet 12   atorvastatin  (LIPITOR) 40 MG tablet Take 1 tablet (40 mg total) by mouth daily. 90 tablet 0   CYMBALTA  30 MG capsule TAKE 1 CAPSULE DAILY FOR 1 WEEK, THEN INCREASE TO 1 CAPSULE TWICE A DAY FOR MOOD.     dapagliflozin  propanediol (FARXIGA ) 10 MG TABS tablet Take 1 tablet (10 mg total) by mouth daily. 90 tablet 0   escitalopram (LEXAPRO) 5 MG tablet Take 5 mg by mouth daily.     gabapentin  (NEURONTIN ) 300 MG capsule Take 2 capsules (600 mg total) by mouth 3 (three) times daily. 90 capsule 1   gabapentin  (NEURONTIN ) 800 MG tablet Take 800 mg by mouth at bedtime.      ipratropium-albuterol  (DUONEB) 0.5-2.5 (3) MG/3ML SOLN Take 3 mLs by nebulization every 6 (six) hours as needed. 360 mL 0   metFORMIN  (GLUCOPHAGE ) 1000  MG tablet Take 1,000 mg by mouth 2 (two) times daily.     metoprolol  succinate (TOPROL -XL) 50 MG 24 hr tablet Take 1 tablet (50 mg total) by mouth daily. Take with or immediately following a meal. 90 tablet 3   sacubitril -valsartan  (ENTRESTO ) 49-51 MG Take 1 tablet by mouth 2 (two) times daily. 60 tablet 6   spironolactone  (ALDACTONE ) 25 MG tablet Take 1 tablet (25 mg total) by mouth daily. 90 tablet 0   tirzepatide  (MOUNJARO ) 15 MG/0.5ML Pen Inject 15 mg into the skin once a week for diabetes after dose titration. 2 mL 11   DULoxetine  (CYMBALTA ) 60 MG capsule Take 1 capsule (60 mg total) by mouth  daily. 60 capsule 3   No current facility-administered medications for this visit.   BP 137/76   Pulse 77   Wt (!) 345 lb (156.5 kg)   SpO2 98%   BMI 52.46 kg/m  General: NAD, obese.  Neck: No JVD, no thyromegaly or thyroid nodule.  Lungs: Clear to auscultation bilaterally with normal respiratory effort. CV: Nondisplaced PMI.  Heart regular S1/S2, no S3/S4, no murmur.  No peripheral edema.  No carotid bruit.  Normal pedal pulses.  Abdomen: Soft, nontender, no hepatosplenomegaly, no distention.  Skin: Intact without lesions or rashes.  Neurologic: Alert and oriented x 3.  Psych: Normal affect. Extremities: No clubbing or cyanosis.  HEENT: Normal.   Assessment/Plan: 1. [name]/OSA: Patient is on home oxygen with exertion + CPAP.  Needs significant weight loss.  - He has started tirzepatide  and weight is trending down.  2.  Chronic systolic CHF => HF with recovered EF: Nonischemic cardiomyopathy.  Echo in 4/25 showed new drop in EF to 35-40%, RV moderately dilated and moderately decreased systolic function. LHC in 4/25 with no significant coronary disease.  Zio monitor in 6/25 showed minimal PVCs.  Echo in 5/25 showed improved EF 50-55%, mild LVH, normal RV, IVC normal.  It is possible that he had a stress cardiomyopathy that has now recovered. Symptomatically doing better, NYHA class II.  - With BP  elevated, increase Entresto  to 49/51 bid with BMET today and BMET in 10 days.  - Continue spironolactone  25 mg daily.  - Continue Toprol  XL 50 mg daily.  - Continue dapagliflozin  10 mg daily.  - Repeat echo in 6 months to make sure LV function stays up.  3. PVCs: Noted to be frequent in hospital, but rare on outpatient Zio monitor while taking Toprol  XL.   Increase activity.   Followup in 6 months with echo.   I spent 32 minutes reviewing records, interviewing/examining patient, and managing orders.   Ezra Shuck 04/12/2024

## 2024-04-14 ENCOUNTER — Ambulatory Visit (HOSPITAL_COMMUNITY): Payer: Self-pay | Admitting: Cardiology

## 2024-04-22 ENCOUNTER — Other Ambulatory Visit (HOSPITAL_COMMUNITY): Payer: Self-pay

## 2024-04-22 ENCOUNTER — Other Ambulatory Visit: Payer: Self-pay

## 2024-05-01 ENCOUNTER — Ambulatory Visit: Admitting: Pulmonary Disease

## 2024-05-02 ENCOUNTER — Other Ambulatory Visit: Payer: Self-pay | Admitting: Family

## 2024-05-02 ENCOUNTER — Other Ambulatory Visit (HOSPITAL_COMMUNITY): Payer: Self-pay

## 2024-05-02 ENCOUNTER — Other Ambulatory Visit: Payer: Self-pay

## 2024-05-02 MED FILL — Atorvastatin Calcium Tab 40 MG (Base Equivalent): ORAL | 90 days supply | Qty: 90 | Fill #0 | Status: CN

## 2024-05-02 MED FILL — Spironolactone Tab 25 MG: ORAL | 90 days supply | Qty: 90 | Fill #0 | Status: CN

## 2024-05-02 MED FILL — Dapagliflozin Propanediol Tab 10 MG (Base Equivalent): ORAL | 90 days supply | Qty: 90 | Fill #0 | Status: CN

## 2024-05-03 ENCOUNTER — Other Ambulatory Visit: Payer: Self-pay

## 2024-05-07 ENCOUNTER — Other Ambulatory Visit (HOSPITAL_COMMUNITY): Payer: Self-pay

## 2024-05-07 ENCOUNTER — Encounter: Payer: Self-pay | Admitting: Pulmonary Disease

## 2024-05-07 ENCOUNTER — Other Ambulatory Visit: Payer: Self-pay

## 2024-05-07 ENCOUNTER — Ambulatory Visit: Admitting: Pulmonary Disease

## 2024-05-07 ENCOUNTER — Other Ambulatory Visit
Admission: RE | Admit: 2024-05-07 | Discharge: 2024-05-07 | Disposition: A | Discharge status: Home / Self Care | Attending: Cardiology | Admitting: Cardiology

## 2024-05-07 VITALS — BP 120/60 | HR 98 | Temp 98.0°F | Ht 68.0 in | Wt 337.0 lb

## 2024-05-07 DIAGNOSIS — I5032 Chronic diastolic (congestive) heart failure: Secondary | ICD-10-CM | POA: Diagnosis present

## 2024-05-07 DIAGNOSIS — G4733 Obstructive sleep apnea (adult) (pediatric): Secondary | ICD-10-CM

## 2024-05-07 DIAGNOSIS — E662 Morbid (severe) obesity with alveolar hypoventilation: Secondary | ICD-10-CM

## 2024-05-07 DIAGNOSIS — Z23 Encounter for immunization: Secondary | ICD-10-CM

## 2024-05-07 DIAGNOSIS — G4730 Sleep apnea, unspecified: Secondary | ICD-10-CM

## 2024-05-07 DIAGNOSIS — I420 Dilated cardiomyopathy: Secondary | ICD-10-CM | POA: Diagnosis not present

## 2024-05-07 DIAGNOSIS — J4520 Mild intermittent asthma, uncomplicated: Secondary | ICD-10-CM | POA: Diagnosis not present

## 2024-05-07 LAB — BASIC METABOLIC PANEL WITH GFR
Anion gap: 14 (ref 5–15)
BUN: 13 mg/dL (ref 6–20)
CO2: 21 mmol/L — ABNORMAL LOW (ref 22–32)
Calcium: 9.3 mg/dL (ref 8.9–10.3)
Chloride: 107 mmol/L (ref 98–111)
Creatinine, Ser: 1.11 mg/dL (ref 0.61–1.24)
GFR, Estimated: >60 mL/min (ref >60)
Glucose, Bld: 93 mg/dL (ref 70–99)
Potassium: 3.6 mmol/L (ref 3.5–5.1)
Sodium: 142 mmol/L (ref 135–145)

## 2024-05-07 LAB — NITRIC OXIDE: Nitric Oxide: <5

## 2024-05-07 MED FILL — Dapagliflozin Propanediol Tab 10 MG (Base Equivalent): ORAL | 90 days supply | Qty: 90 | Fill #0 | Status: AC

## 2024-05-07 MED FILL — Spironolactone Tab 25 MG: ORAL | 90 days supply | Qty: 90 | Fill #0 | Status: AC

## 2024-05-07 MED FILL — Atorvastatin Calcium Tab 40 MG (Base Equivalent): ORAL | 90 days supply | Qty: 90 | Fill #0 | Status: AC

## 2024-05-07 NOTE — Patient Instructions (Addendum)
 VISIT SUMMARY:  Clifford Nichols, a 43 year old male, visited today due to shortness of breath. He has severe obstructive sleep apnea, which is well-managed with a noninvasive ventilator, and suspected mild intermittent asthma.  YOUR PLAN:  -SEVERE OBSTRUCTIVE SLEEP APNEA: Severe obstructive sleep apnea is a condition where your airway becomes blocked during sleep, causing breathing pauses. Your condition is well-managed with the consistent use of your noninvasive ventilator at night, and your compliance is excellent at 100%.  -MILD INTERMITTENT ASTHMA: Mild intermittent asthma is a condition where your airways occasionally become inflamed, causing shortness of breath. Currently, there is no airway inflammation, and you do not need to use an inhaler regularly. We will perform an airway inflammation test and you should continue using your emergency inhaler as needed. You have also received your flu shot.  INSTRUCTIONS:  Please schedule a follow-up appointment in four months to monitor your respiratory status and adjust treatment if necessary.

## 2024-05-07 NOTE — Progress Notes (Signed)
 Subjective:    Patient ID: Clifford Nichols, male    DOB: April 21, 1981, 43 y.o.   MRN: [id number]  Patient Care Team: Kandis Stefano Iles, MD as PCP - General (Family Medicine) Perla Evalene PARAS, MD as PCP - Cardiology (Cardiology) Tamea Dedra LITTIE, MD as Consulting Physician (Pulmonary Disease) Kandis Stefano Iles, MD (Family Medicine)  Chief Complaint  Patient presents with   Medical Management of Chronic Issues    No cough or wheezing. Shortness of breath on exertion and occasional at rest. Using vent nightly. Has not used inhalers.     BACKGROUND/INTERVAL:The patient is a 43 year old morbidly obese gentleman who presents for follow-up of obesity with obesity hypoventilation syndrome. Previously evaluated by Dr. Carolynne Allan for severe obstructive sleep apnea. Past history significant for ARDS from COVID-19 pneumonia in 2021.  He had been on oxygen since then. Required tracheostomy during his COVID-19 hospitalization.  On a visit of 19 October 2023  he was noted to have oxygen saturations of 80% on room air and his sleep study that had been done showed an AHI of 211.3.  He was set up with oxygen and BiPAP at that time.  He had an exacerbation on 01 November 2023 was admitted to the ICU and required intubation and mechanical ventilation.  He had bilateral pneumonia.  He was also noted to have a cardiomyopathy.  He has progressively gotten better with use of noninvasive ventilator.  He is on a vent tech V+ pro noninvasive ventilator.  He was last seen here on 30 January 2024.  He is on Mounjaro  for weight loss.  HPI Discussed the use of AI scribe software for clinical note transcription with the patient, who gave verbal consent to proceed.  History of Present Illness   Clifford Nichols is a 43 year old male with severe obstructive sleep apnea and obesity with obesity hypoventilation syndrome who presents with shortness of breath.  He experiences shortness of breath but is not using any inhalers  regularly. He has an emergency inhaler available for use if needed. He describes his breathing as 'pretty good.'  He is engaged in weight loss with the help of Mounjaro .  He has lost a total of 47 pounds since starting on the medication.  He also tries to stay active, previously required transport chair now ambulates with the assistance of a walker.  He has severe obstructive sleep apnea and uses a noninvasive ventilator at night with a reported compliance of 100%. He indicates that he is doing well with the machine, which is helping his condition.  Download from [name] shows that the patient indeed has 100% compliance over 30 days with the device  He has received his flu shot.      EQUIPMENT Vent tech V+ pro ventilator settings: target tidal volume 550, PS minimum 6, PS max 14, auto EPAP 5-15, backup respiratory rate 10, sensitivity 3, rise time 4, leak compensation present.  DME is [name].  DATA 09/30/2023 in-lab sleep study: Severe sleep apnea with AHI of 211.3 oxygen nadir of 92%.  Patient was titrated to BiPAP. 10/19/2023 ABG on RA: pH 7.35, pCO2 65, PaO2 64, calculated bicarb 35.9, O2 sat 92.3% 10/31/2023 CT angio chest: Limited study without evidence of pulmonary embolism.  Bilateral lower lobe scarring, atelectasis and/or infiltrate.  Mild right middle lobe atelectatic changes.  Properly positioned endotracheal and nasogastric tubes. 10/31/2023 echocardiogram: LVEF 35 to 40%, LV with moderately decreased function.  Global hypokinesis of the LV.  Mild LVH.  Indeterminate diastolic parameters.  RV systolic function moderately reduced.  RV size moderately enlarged.  Normal pulmonary artery systolic pressure.  No mitral or aortic valve abnormalities. 11/14/2023 right and left heart cath: Minimal CAD.  Well compensated hemodynamics with low filling pressures. 11/28/2023 PFTs: FEV1 2.59 L or 65% predicted, FVC 3.27 L or 65% predicted, FEV1/FVC 79%, there is hyperinflation and air  trapping evident on lung volumes.  No significant bronchodilator response.  Diffusion capacity is mildly reduced however it corrects for alveolar volume to normal.  ERV is decreased consistent with obesity. 12/06/2023 echocardiogram: LVEF 50 to 55%.  LV has low normal function.  LV has no regional wall motion abnormalities.  Mild left ventricular hypertrophy.  Determinate left ventricular diastolic parameters.  RV function is normal.  RV size is normal.  No valvular abnormalities.  Review of Systems A 10 point review of systems was performed and it is as noted above otherwise negative.   Patient Active Problem List   Diagnosis Date Noted   Acute respiratory failure with hypoxia and hypercapnia (HCC) 11/12/2023   Acute sepsis (HCC) 11/12/2023   Septic shock (HCC) 11/12/2023   Dilated cardiomyopathy (HCC) 11/12/2023   Obesity hypoventilation syndrome (HCC) 11/12/2023   Sleep apnea 11/12/2023   Acute on chronic combined systolic and diastolic CHF (congestive heart failure) (HCC) 11/12/2023   Acute on chronic respiratory failure with hypoxia and hypercapnia (HCC) 10/31/2023   History of COVID-19 05/14/2020   Foot pain (Bilateral) 04/07/2020   Chronic pain syndrome 04/05/2020   Pharmacologic therapy 04/05/2020   Disorder of skeletal system 04/05/2020   Problems influencing health status 04/05/2020   Long term prescription opiate use 04/05/2020   Uncomplicated opioid dependence (HCC) 04/05/2020   Opioid use (90 MME) 04/05/2020   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/2020   Neurogenic pain 04/05/2020   Chronic neuropathic pain 04/05/2020   Morbid obesity with BMI of 50.0-59.9, adult (HCC) 01/27/2020   OSA (obstructive sleep apnea) 01/27/2020   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   Debility 01/13/2020   Status post tracheostomy (HCC)    Pressure injury of skin 12/27/2019   Acute on chronic respiratory failure with hypoxia and hypercapnia (HCC)    Acute respiratory failure  (HCC)    ARDS (adult respiratory distress syndrome) (HCC) 12/04/2019   COVID-19 12/03/2019   Acute respiratory failure due to COVID-19 (HCC) 12/02/2019   Hyperglycemia 12/02/2019   Diabetes (HCC) 12/02/2019   HTN (hypertension) 04/27/2018    Social History   Tobacco Use   Smoking status: Never    Passive exposure: Never   Smokeless tobacco: Never  Substance Use Topics   Alcohol  use: Never    No Known Allergies  Current Meds  Medication Sig   acetaminophen  (TYLENOL ) 325 MG tablet Take 1-2 tablets (325-650 mg total) by mouth every 4 (four) hours as needed for mild pain.   aspirin  EC 81 MG tablet Take 1 tablet (81 mg total) by mouth daily. Swallow whole.   atorvastatin  (LIPITOR) 40 MG tablet Take 1 tablet (40 mg total) by mouth daily.   CYMBALTA  30 MG capsule TAKE 1 CAPSULE DAILY FOR 1 WEEK, THEN INCREASE TO 1 CAPSULE TWICE A DAY FOR MOOD.   dapagliflozin  propanediol (FARXIGA ) 10 MG TABS tablet Take 1 tablet (10 mg total) by mouth daily.   escitalopram (LEXAPRO) 5 MG tablet Take 5 mg by mouth daily.   gabapentin  (NEURONTIN ) 300 MG capsule Take 2 capsules (600 mg total) by mouth 3 (three) times daily.   gabapentin  (NEURONTIN )  800 MG tablet Take 800 mg by mouth at bedtime.    metFORMIN  (GLUCOPHAGE ) 1000 MG tablet Take 1,000 mg by mouth 2 (two) times daily.   metoprolol  succinate (TOPROL -XL) 50 MG 24 hr tablet Take 1 tablet (50 mg total) by mouth daily. Take with or immediately following a meal.   sacubitril -valsartan  (ENTRESTO ) 49-51 MG Take 1 tablet by mouth 2 (two) times daily.   spironolactone  (ALDACTONE ) 25 MG tablet Take 1 tablet (25 mg total) by mouth daily.   tirzepatide  (MOUNJARO ) 15 MG/0.5ML Pen Inject 15 mg into the skin once a week for diabetes after dose titration.    Immunization History  Administered Date(s) Administered   Influenza-Unspecified 07/26/2023   PNEUMOCOCCAL CONJUGATE-20 11/28/2023        Objective:     BP 120/60   Pulse 98   Temp 98 F (36.7  C) (Temporal)   Ht 5' 8 (1.727 m)   Wt (!) 337 lb (152.9 kg)   SpO2 98%   BMI 51.24 kg/m   SpO2: 98 %  GENERAL: Morbidly obese gentleman, presents ambulatory with assistance of a walker.  No respiratory distress.  No conversational dyspnea.  Awake, alert, conversant.  Not requiring oxygen. HEAD: Normocephalic, atraumatic.  EYES: Pupils equal, round, reactive to light.  No scleral icterus.  MOUTH: Dentition intact, oral mucosa moist.  No thrush. NECK: Supple. No thyromegaly. Trachea midline.  Cannot assess JVD.  Very thick neck.  No adenopathy.  Prior tracheotomy scar well healed. PULMONARY: Good air entry bilaterally.  No adventitious sounds. CARDIOVASCULAR: S1 and S2.  Tachycardic, regular rate, no rubs murmurs gallops heard.  ABDOMEN: Extremely obese, soft.  No other assessment possible. MUSCULOSKELETAL: No joint deformity, no clubbing, no edema.  NEUROLOGIC: No overt neurodeficit.  No asterixis. SKIN: Intact,warm,dry. PSYCH: Mood and behavior normal.  Lab Results  Component Value Date   NITRICOXIDE <5 05/07/2024  *This result suggests low (<25) Type II (T2) airway inflammation indicating a low likelihood of active T2-driven airway inflammation.  In a patient with active T2 driven asthma management it suggests good control.       Assessment & Plan:     ICD-10-CM   1. Severe sleep apnea  G47.30     2. Extreme obesity with alveolar hypoventilation (HCC)  E66.2     3. Dilated cardiomyopathy (HCC)  I42.0     4. Mild intermittent asthma without complication  J45.20 Nitric oxide     5. Need for influenza vaccination  Z23 Flu vaccine trivalent PF, 6mos and older(Flulaval,Afluria,Fluarix,Fluzone)    CANCELED: Flu vaccine trivalent PF, 6mos and older(Flulaval,Afluria,Fluarix,Fluzone)    *Vaccination status confirmed, patient did not require flu vaccine today.  Order was canceled.  Orders Placed This Encounter  Procedures   Nitric oxide    Discussion:    Severe obstructive  sleep apnea Severe obstructive sleep apnea is well-managed with consistent use of noninvasive ventilator at night. Compliance is at 100%, indicating effective adherence to therapy.  Mild intermittent asthma Suspected mild intermittent asthma with no current airway inflammation. Symptoms may be triggered by certain factors, but no need for regular inhaler use. Differential diagnosis includes other conditions related to cardiac issues that may mimic asthma symptoms. - Perform airway inflammation test - Continue as needed use of emergency inhaler   Follow-Up - Schedule follow-up appointment in four months to monitor respiratory status and adjust treatment as necessary.   Patient is to follow-up in 4 months time call sooner should any problems arise.  Advised if symptoms do not  improve or worsen, to please contact office for sooner follow up or seek emergency care.    I spent 30 minutes of dedicated to the care of this patient on the date of this encounter to include pre-visit review of records, face-to-face time with the patient discussing conditions above, post visit ordering of testing, clinical documentation with the electronic health record, making appropriate referrals as documented, and communicating necessary findings to members of the patients care team.     C. Leita Sanders, MD Advanced Bronchoscopy P[facility]    *This note was generated using voice recognition software/Dragon and/or AI transcription program.  Despite best efforts to proofread, errors can occur which can change the meaning. Any transcriptional errors that result from this process are unintentional and may not be fully corrected at the time of dictation.

## 2024-05-11 ENCOUNTER — Encounter: Payer: Self-pay | Admitting: Pulmonary Disease

## 2024-05-27 ENCOUNTER — Other Ambulatory Visit: Payer: Self-pay

## 2024-05-28 ENCOUNTER — Other Ambulatory Visit: Payer: Self-pay

## 2024-05-30 ENCOUNTER — Other Ambulatory Visit: Payer: Self-pay

## 2024-05-31 ENCOUNTER — Other Ambulatory Visit: Payer: Self-pay

## 2024-06-03 ENCOUNTER — Other Ambulatory Visit: Payer: Self-pay

## 2024-06-24 ENCOUNTER — Other Ambulatory Visit: Payer: Self-pay

## 2024-06-24 ENCOUNTER — Other Ambulatory Visit (HOSPITAL_COMMUNITY): Payer: Self-pay

## 2024-06-26 ENCOUNTER — Other Ambulatory Visit: Payer: Self-pay

## 2024-07-24 ENCOUNTER — Other Ambulatory Visit: Payer: Self-pay

## 2024-07-24 ENCOUNTER — Other Ambulatory Visit (HOSPITAL_COMMUNITY): Payer: Self-pay

## 2024-07-26 ENCOUNTER — Other Ambulatory Visit (HOSPITAL_COMMUNITY): Payer: Self-pay

## 2024-07-26 ENCOUNTER — Other Ambulatory Visit: Payer: Self-pay

## 2024-07-29 ENCOUNTER — Other Ambulatory Visit (HOSPITAL_COMMUNITY): Payer: Self-pay

## 2024-07-29 ENCOUNTER — Other Ambulatory Visit: Payer: Self-pay

## 2024-08-04 ENCOUNTER — Other Ambulatory Visit: Payer: Self-pay | Admitting: Family

## 2024-08-05 ENCOUNTER — Other Ambulatory Visit: Payer: Self-pay

## 2024-08-05 MED ORDER — ATORVASTATIN CALCIUM 40 MG PO TABS
40.0000 mg | ORAL_TABLET | Freq: Every day | ORAL | 1 refills | Status: AC
  Filled 2024-08-05: qty 90, 90d supply, fill #0

## 2024-08-05 MED ORDER — SPIRONOLACTONE 25 MG PO TABS
25.0000 mg | ORAL_TABLET | Freq: Every day | ORAL | 1 refills | Status: AC
  Filled 2024-08-05: qty 90, 90d supply, fill #0

## 2024-08-12 ENCOUNTER — Other Ambulatory Visit: Payer: Self-pay

## 2024-08-12 ENCOUNTER — Other Ambulatory Visit (HOSPITAL_COMMUNITY): Payer: Self-pay

## 2024-08-12 ENCOUNTER — Other Ambulatory Visit: Payer: Self-pay | Admitting: Family

## 2024-08-12 MED ORDER — DAPAGLIFLOZIN PROPANEDIOL 10 MG PO TABS
10.0000 mg | ORAL_TABLET | Freq: Every day | ORAL | 1 refills | Status: AC
  Filled 2024-08-12: qty 90, 90d supply, fill #0

## 2024-08-27 ENCOUNTER — Other Ambulatory Visit: Payer: Self-pay

## 2024-10-10 ENCOUNTER — Other Ambulatory Visit
